# Patient Record
Sex: Female | Born: 1950
Health system: Southern US, Community
[De-identification: ages and names within clinical notes are randomized; demographics above are authoritative.]

## PROBLEM LIST (undated history)

## (undated) ENCOUNTER — Emergency Department (HOSPITAL_COMMUNITY): Admission: EM | Payer: 59 | Source: Home / Self Care

## (undated) DIAGNOSIS — N189 Chronic kidney disease, unspecified: Secondary | ICD-10-CM

## (undated) DIAGNOSIS — E119 Type 2 diabetes mellitus without complications: Secondary | ICD-10-CM

## (undated) DIAGNOSIS — M545 Low back pain, unspecified: Secondary | ICD-10-CM

## (undated) DIAGNOSIS — M1990 Unspecified osteoarthritis, unspecified site: Secondary | ICD-10-CM

## (undated) DIAGNOSIS — K219 Gastro-esophageal reflux disease without esophagitis: Secondary | ICD-10-CM

## (undated) DIAGNOSIS — G4700 Insomnia, unspecified: Secondary | ICD-10-CM

## (undated) DIAGNOSIS — E785 Hyperlipidemia, unspecified: Secondary | ICD-10-CM

## (undated) DIAGNOSIS — D649 Anemia, unspecified: Secondary | ICD-10-CM

## (undated) DIAGNOSIS — I2699 Other pulmonary embolism without acute cor pulmonale: Secondary | ICD-10-CM

## (undated) DIAGNOSIS — E669 Obesity, unspecified: Secondary | ICD-10-CM

## (undated) DIAGNOSIS — I10 Essential (primary) hypertension: Secondary | ICD-10-CM

## (undated) HISTORY — PX: TENOTOMY: SHX397

## (undated) HISTORY — DX: Hyperlipidemia, unspecified: E78.5

## (undated) HISTORY — DX: Obesity, unspecified: E66.9

## (undated) HISTORY — DX: Type 2 diabetes mellitus without complications: E11.9

## (undated) HISTORY — DX: Anemia, unspecified: D64.9

## (undated) HISTORY — DX: Unspecified osteoarthritis, unspecified site: M19.90

## (undated) HISTORY — DX: Chronic kidney disease, unspecified: N18.9

## (undated) HISTORY — PX: TUBAL LIGATION: SHX77

## (undated) HISTORY — DX: Low back pain, unspecified: M54.50

## (undated) HISTORY — PX: OTHER SURGICAL HISTORY: SHX169

## (undated) HISTORY — DX: Gastro-esophageal reflux disease without esophagitis: K21.9

## (undated) HISTORY — DX: Insomnia, unspecified: G47.00

## (undated) HISTORY — DX: Essential (primary) hypertension: I10

## (undated) HISTORY — DX: Low back pain: M54.5

---

## 1993-05-10 DIAGNOSIS — I10 Essential (primary) hypertension: Secondary | ICD-10-CM

## 1993-05-10 HISTORY — DX: Essential (primary) hypertension: I10

## 1998-05-10 DIAGNOSIS — E785 Hyperlipidemia, unspecified: Secondary | ICD-10-CM

## 1998-05-10 HISTORY — DX: Hyperlipidemia, unspecified: E78.5

## 1998-08-08 ENCOUNTER — Ambulatory Visit (HOSPITAL_COMMUNITY): Admission: RE | Admit: 1998-08-08 | Discharge: 1998-08-08 | Payer: Self-pay | Admitting: Pulmonary Disease

## 2000-10-25 ENCOUNTER — Encounter: Payer: Self-pay | Admitting: Internal Medicine

## 2000-10-25 ENCOUNTER — Ambulatory Visit (HOSPITAL_COMMUNITY): Admission: RE | Admit: 2000-10-25 | Discharge: 2000-10-25 | Payer: Self-pay | Admitting: Internal Medicine

## 2000-11-04 ENCOUNTER — Ambulatory Visit (HOSPITAL_COMMUNITY): Admission: RE | Admit: 2000-11-04 | Discharge: 2000-11-04 | Payer: Self-pay | Admitting: Internal Medicine

## 2000-11-04 ENCOUNTER — Encounter: Payer: Self-pay | Admitting: Internal Medicine

## 2000-11-08 ENCOUNTER — Ambulatory Visit (HOSPITAL_COMMUNITY): Admission: RE | Admit: 2000-11-08 | Discharge: 2000-11-08 | Payer: Self-pay | Admitting: Internal Medicine

## 2000-11-08 ENCOUNTER — Encounter: Payer: Self-pay | Admitting: Internal Medicine

## 2000-11-21 ENCOUNTER — Ambulatory Visit (HOSPITAL_COMMUNITY): Admission: RE | Admit: 2000-11-21 | Discharge: 2000-11-21 | Payer: Self-pay | Admitting: Internal Medicine

## 2000-11-21 ENCOUNTER — Encounter: Payer: Self-pay | Admitting: Internal Medicine

## 2000-11-24 ENCOUNTER — Other Ambulatory Visit: Admission: RE | Admit: 2000-11-24 | Discharge: 2000-11-24 | Payer: Self-pay | Admitting: General Surgery

## 2000-11-26 ENCOUNTER — Emergency Department (HOSPITAL_COMMUNITY): Admission: EM | Admit: 2000-11-26 | Discharge: 2000-11-26 | Payer: Self-pay | Admitting: Emergency Medicine

## 2000-11-26 ENCOUNTER — Encounter: Payer: Self-pay | Admitting: Emergency Medicine

## 2000-11-28 ENCOUNTER — Inpatient Hospital Stay (HOSPITAL_COMMUNITY): Admission: RE | Admit: 2000-11-28 | Discharge: 2000-12-01 | Payer: Self-pay | Admitting: General Surgery

## 2000-11-28 ENCOUNTER — Ambulatory Visit (HOSPITAL_COMMUNITY): Admission: RE | Admit: 2000-11-28 | Discharge: 2000-11-28 | Payer: Self-pay | Admitting: Emergency Medicine

## 2000-11-28 ENCOUNTER — Encounter: Payer: Self-pay | Admitting: Emergency Medicine

## 2001-04-19 ENCOUNTER — Encounter: Payer: Self-pay | Admitting: Internal Medicine

## 2001-04-19 ENCOUNTER — Ambulatory Visit (HOSPITAL_COMMUNITY): Admission: RE | Admit: 2001-04-19 | Discharge: 2001-04-19 | Payer: Self-pay | Admitting: Internal Medicine

## 2002-09-26 ENCOUNTER — Ambulatory Visit (HOSPITAL_COMMUNITY): Admission: RE | Admit: 2002-09-26 | Discharge: 2002-09-26 | Payer: Self-pay | Admitting: Internal Medicine

## 2002-09-26 ENCOUNTER — Encounter: Payer: Self-pay | Admitting: Internal Medicine

## 2003-10-01 ENCOUNTER — Ambulatory Visit (HOSPITAL_COMMUNITY): Admission: RE | Admit: 2003-10-01 | Discharge: 2003-10-01 | Payer: Self-pay | Admitting: Internal Medicine

## 2004-11-09 ENCOUNTER — Ambulatory Visit (HOSPITAL_COMMUNITY): Admission: RE | Admit: 2004-11-09 | Discharge: 2004-11-09 | Payer: Self-pay | Admitting: Internal Medicine

## 2005-02-12 ENCOUNTER — Ambulatory Visit (HOSPITAL_COMMUNITY): Admission: RE | Admit: 2005-02-12 | Discharge: 2005-02-12 | Payer: Self-pay | Admitting: Internal Medicine

## 2005-05-10 HISTORY — PX: CHOLECYSTECTOMY: SHX55

## 2007-04-26 ENCOUNTER — Ambulatory Visit: Payer: Self-pay | Admitting: Internal Medicine

## 2007-04-26 DIAGNOSIS — K219 Gastro-esophageal reflux disease without esophagitis: Secondary | ICD-10-CM | POA: Insufficient documentation

## 2007-04-26 DIAGNOSIS — I1A0 Resistant hypertension: Secondary | ICD-10-CM | POA: Insufficient documentation

## 2007-04-26 DIAGNOSIS — M545 Low back pain, unspecified: Secondary | ICD-10-CM | POA: Insufficient documentation

## 2007-04-26 DIAGNOSIS — I10 Essential (primary) hypertension: Secondary | ICD-10-CM | POA: Insufficient documentation

## 2007-04-27 ENCOUNTER — Telehealth (INDEPENDENT_AMBULATORY_CARE_PROVIDER_SITE_OTHER): Payer: Self-pay | Admitting: *Deleted

## 2007-05-25 ENCOUNTER — Telehealth (INDEPENDENT_AMBULATORY_CARE_PROVIDER_SITE_OTHER): Payer: Self-pay | Admitting: *Deleted

## 2007-05-25 ENCOUNTER — Ambulatory Visit: Payer: Self-pay | Admitting: Family Medicine

## 2007-05-25 DIAGNOSIS — R05 Cough: Secondary | ICD-10-CM

## 2007-05-25 DIAGNOSIS — K029 Dental caries, unspecified: Secondary | ICD-10-CM | POA: Insufficient documentation

## 2007-05-25 DIAGNOSIS — R059 Cough, unspecified: Secondary | ICD-10-CM | POA: Insufficient documentation

## 2007-06-09 ENCOUNTER — Ambulatory Visit: Payer: Self-pay | Admitting: Internal Medicine

## 2007-06-09 DIAGNOSIS — E1165 Type 2 diabetes mellitus with hyperglycemia: Secondary | ICD-10-CM

## 2007-06-09 DIAGNOSIS — E119 Type 2 diabetes mellitus without complications: Secondary | ICD-10-CM | POA: Insufficient documentation

## 2007-06-09 DIAGNOSIS — E118 Type 2 diabetes mellitus with unspecified complications: Secondary | ICD-10-CM | POA: Insufficient documentation

## 2007-06-09 DIAGNOSIS — Z794 Long term (current) use of insulin: Secondary | ICD-10-CM

## 2007-06-09 LAB — CONVERTED CEMR LAB
Blood Glucose, Fingerstick: 170
Hgb A1c MFr Bld: 8.8 %

## 2007-07-12 ENCOUNTER — Encounter (INDEPENDENT_AMBULATORY_CARE_PROVIDER_SITE_OTHER): Payer: Self-pay | Admitting: Family Medicine

## 2007-09-01 ENCOUNTER — Telehealth (INDEPENDENT_AMBULATORY_CARE_PROVIDER_SITE_OTHER): Payer: Self-pay | Admitting: Internal Medicine

## 2007-09-01 ENCOUNTER — Ambulatory Visit: Payer: Self-pay | Admitting: Internal Medicine

## 2007-09-01 LAB — CONVERTED CEMR LAB
Blood Glucose, Fingerstick: 141
Hgb A1c MFr Bld: 7.5 %

## 2007-09-03 LAB — CONVERTED CEMR LAB
BUN: 14 mg/dL (ref 6–23)
Calcium: 8.8 mg/dL (ref 8.4–10.5)
LDL Cholesterol: 118 mg/dL — ABNORMAL HIGH (ref 0–99)
Potassium: 4.3 meq/L (ref 3.5–5.3)
Sodium: 141 meq/L (ref 135–145)
Total CHOL/HDL Ratio: 3.8
Total Protein: 7.2 g/dL (ref 6.0–8.3)
Triglycerides: 89 mg/dL (ref <150)

## 2007-09-04 ENCOUNTER — Telehealth (INDEPENDENT_AMBULATORY_CARE_PROVIDER_SITE_OTHER): Payer: Self-pay | Admitting: *Deleted

## 2007-09-11 ENCOUNTER — Encounter (INDEPENDENT_AMBULATORY_CARE_PROVIDER_SITE_OTHER): Payer: Self-pay | Admitting: Internal Medicine

## 2007-10-06 ENCOUNTER — Telehealth (INDEPENDENT_AMBULATORY_CARE_PROVIDER_SITE_OTHER): Payer: Self-pay | Admitting: Internal Medicine

## 2007-10-09 ENCOUNTER — Telehealth (INDEPENDENT_AMBULATORY_CARE_PROVIDER_SITE_OTHER): Payer: Self-pay | Admitting: Internal Medicine

## 2007-11-24 ENCOUNTER — Ambulatory Visit: Payer: Self-pay | Admitting: Internal Medicine

## 2007-11-24 DIAGNOSIS — E785 Hyperlipidemia, unspecified: Secondary | ICD-10-CM | POA: Insufficient documentation

## 2007-11-24 DIAGNOSIS — R609 Edema, unspecified: Secondary | ICD-10-CM | POA: Insufficient documentation

## 2007-11-24 LAB — CONVERTED CEMR LAB: Blood Glucose, Fingerstick: 177

## 2007-11-27 LAB — CONVERTED CEMR LAB
Creatinine, Urine: 130.8 mg/dL
Microalb, Ur: 0.86 mg/dL (ref 0.00–1.89)

## 2008-02-23 ENCOUNTER — Ambulatory Visit (HOSPITAL_COMMUNITY): Admission: RE | Admit: 2008-02-23 | Discharge: 2008-02-23 | Payer: Self-pay | Admitting: Internal Medicine

## 2008-03-01 ENCOUNTER — Ambulatory Visit: Payer: Self-pay | Admitting: Internal Medicine

## 2008-03-01 DIAGNOSIS — M79606 Pain in leg, unspecified: Secondary | ICD-10-CM | POA: Insufficient documentation

## 2008-03-01 DIAGNOSIS — M79609 Pain in unspecified limb: Secondary | ICD-10-CM | POA: Insufficient documentation

## 2008-03-04 LAB — CONVERTED CEMR LAB
AST: 15 units/L (ref 0–37)
Albumin: 4.1 g/dL (ref 3.5–5.2)
Alkaline Phosphatase: 78 units/L (ref 39–117)
Basophils Relative: 0 % (ref 0–1)
CO2: 24 meq/L (ref 19–32)
Calcium: 8.9 mg/dL (ref 8.4–10.5)
Chloride: 101 meq/L (ref 96–112)
Creatinine, Ser: 0.8 mg/dL (ref 0.40–1.20)
HDL: 53 mg/dL (ref >39)
Hemoglobin: 11.5 g/dL — ABNORMAL LOW (ref 12.0–15.0)
Lymphocytes Relative: 50 % — ABNORMAL HIGH (ref 12–46)
Lymphs Abs: 2.5 10*3/uL (ref 0.7–4.0)
MCV: 85.8 fL (ref 78.0–100.0)
Monocytes Relative: 8 % (ref 3–12)
Neutro Abs: 2 10*3/uL (ref 1.7–7.7)
Platelets: 237 10*3/uL (ref 150–400)
Sodium: 139 meq/L (ref 135–145)
Total Bilirubin: 0.4 mg/dL (ref 0.3–1.2)
Total CHOL/HDL Ratio: 3.4
Total CK: 131 units/L (ref 7–177)
WBC: 5.1 10*3/uL (ref 4.0–10.5)

## 2008-03-07 ENCOUNTER — Encounter (INDEPENDENT_AMBULATORY_CARE_PROVIDER_SITE_OTHER): Payer: Self-pay | Admitting: Internal Medicine

## 2008-03-15 ENCOUNTER — Ambulatory Visit: Payer: Self-pay | Admitting: Internal Medicine

## 2008-03-15 ENCOUNTER — Ambulatory Visit (HOSPITAL_COMMUNITY): Admission: RE | Admit: 2008-03-15 | Discharge: 2008-03-15 | Payer: Self-pay | Admitting: Internal Medicine

## 2008-03-28 ENCOUNTER — Emergency Department (HOSPITAL_COMMUNITY): Admission: EM | Admit: 2008-03-28 | Discharge: 2008-03-28 | Payer: Self-pay | Admitting: Emergency Medicine

## 2008-04-10 ENCOUNTER — Ambulatory Visit: Payer: Self-pay | Admitting: Internal Medicine

## 2008-05-27 ENCOUNTER — Ambulatory Visit: Payer: Self-pay | Admitting: Internal Medicine

## 2008-05-27 LAB — CONVERTED CEMR LAB: Blood Glucose, Fingerstick: 177

## 2008-07-11 ENCOUNTER — Encounter (INDEPENDENT_AMBULATORY_CARE_PROVIDER_SITE_OTHER): Payer: Self-pay | Admitting: Internal Medicine

## 2008-07-12 ENCOUNTER — Ambulatory Visit: Payer: Self-pay | Admitting: Internal Medicine

## 2008-08-21 ENCOUNTER — Encounter (INDEPENDENT_AMBULATORY_CARE_PROVIDER_SITE_OTHER): Payer: Self-pay | Admitting: Internal Medicine

## 2008-08-23 ENCOUNTER — Ambulatory Visit: Payer: Self-pay | Admitting: Internal Medicine

## 2008-08-23 LAB — CONVERTED CEMR LAB: Hgb A1c MFr Bld: 7.9 %

## 2008-08-26 LAB — CONVERTED CEMR LAB
AST: 11 units/L (ref 0–37)
Albumin: 3.9 g/dL (ref 3.5–5.2)
Alkaline Phosphatase: 71 units/L (ref 39–117)
BUN: 19 mg/dL (ref 6–23)
Basophils Absolute: 0 10*3/uL (ref 0.0–0.1)
Calcium: 9.2 mg/dL (ref 8.4–10.5)
Chloride: 103 meq/L (ref 96–112)
Creatinine, Ser: 0.86 mg/dL (ref 0.40–1.20)
HDL: 50 mg/dL (ref >39)
Hemoglobin: 11 g/dL — ABNORMAL LOW (ref 12.0–15.0)
LDL Cholesterol: 136 mg/dL — ABNORMAL HIGH (ref 0–99)
MCHC: 31.9 g/dL (ref 30.0–36.0)
Monocytes Absolute: 0.5 10*3/uL (ref 0.1–1.0)
RBC: 4.24 M/uL (ref 3.87–5.11)
Sodium: 139 meq/L (ref 135–145)
Total Bilirubin: 0.3 mg/dL (ref 0.3–1.2)
Total Protein: 7 g/dL (ref 6.0–8.3)

## 2008-08-29 ENCOUNTER — Encounter (INDEPENDENT_AMBULATORY_CARE_PROVIDER_SITE_OTHER): Payer: Self-pay | Admitting: Internal Medicine

## 2008-09-02 LAB — CONVERTED CEMR LAB
TIBC: 355 ug/dL (ref 250–470)
UIBC: 290 ug/dL

## 2008-11-29 ENCOUNTER — Ambulatory Visit: Payer: Self-pay | Admitting: Internal Medicine

## 2008-11-29 LAB — CONVERTED CEMR LAB
Creatinine, Urine: 124.8 mg/dL
Microalb Creat Ratio: 4 mg/g (ref 0.0–30.0)

## 2008-12-27 ENCOUNTER — Ambulatory Visit: Payer: Self-pay | Admitting: Internal Medicine

## 2008-12-28 ENCOUNTER — Encounter (INDEPENDENT_AMBULATORY_CARE_PROVIDER_SITE_OTHER): Payer: Self-pay | Admitting: Internal Medicine

## 2008-12-28 LAB — CONVERTED CEMR LAB
Creatinine, Urine: 75.9 mg/dL
Microalb Creat Ratio: 6.6 mg/g (ref 0.0–30.0)
Microalb, Ur: 0.5 mg/dL (ref 0.00–1.89)

## 2008-12-30 ENCOUNTER — Telehealth (INDEPENDENT_AMBULATORY_CARE_PROVIDER_SITE_OTHER): Payer: Self-pay | Admitting: *Deleted

## 2009-01-03 ENCOUNTER — Encounter (INDEPENDENT_AMBULATORY_CARE_PROVIDER_SITE_OTHER): Payer: Self-pay | Admitting: Internal Medicine

## 2009-01-10 ENCOUNTER — Ambulatory Visit: Payer: Self-pay | Admitting: Internal Medicine

## 2009-02-03 ENCOUNTER — Ambulatory Visit: Payer: Self-pay | Admitting: Family Medicine

## 2009-02-03 DIAGNOSIS — E669 Obesity, unspecified: Secondary | ICD-10-CM | POA: Insufficient documentation

## 2009-02-25 ENCOUNTER — Ambulatory Visit (HOSPITAL_COMMUNITY): Admission: RE | Admit: 2009-02-25 | Discharge: 2009-02-25 | Payer: Self-pay | Admitting: Family Medicine

## 2009-03-17 ENCOUNTER — Encounter: Payer: Self-pay | Admitting: Family Medicine

## 2009-03-26 ENCOUNTER — Ambulatory Visit: Payer: Self-pay | Admitting: Family Medicine

## 2009-03-26 ENCOUNTER — Other Ambulatory Visit: Admission: RE | Admit: 2009-03-26 | Discharge: 2009-03-26 | Payer: Self-pay | Admitting: Family Medicine

## 2009-06-05 ENCOUNTER — Ambulatory Visit: Payer: Self-pay | Admitting: Family Medicine

## 2009-08-12 ENCOUNTER — Ambulatory Visit: Payer: Self-pay | Admitting: Family Medicine

## 2009-08-12 DIAGNOSIS — D649 Anemia, unspecified: Secondary | ICD-10-CM | POA: Insufficient documentation

## 2009-08-12 DIAGNOSIS — R5381 Other malaise: Secondary | ICD-10-CM | POA: Insufficient documentation

## 2009-08-12 DIAGNOSIS — R5383 Other fatigue: Secondary | ICD-10-CM

## 2009-08-13 ENCOUNTER — Encounter: Payer: Self-pay | Admitting: Family Medicine

## 2009-08-13 LAB — CONVERTED CEMR LAB: Retic Ct Pct: 0.8 % (ref 0.4–3.1)

## 2009-10-14 ENCOUNTER — Ambulatory Visit: Payer: Self-pay | Admitting: Family Medicine

## 2009-10-14 DIAGNOSIS — R1310 Dysphagia, unspecified: Secondary | ICD-10-CM | POA: Insufficient documentation

## 2009-10-19 DIAGNOSIS — G4700 Insomnia, unspecified: Secondary | ICD-10-CM | POA: Insufficient documentation

## 2009-11-24 ENCOUNTER — Ambulatory Visit: Payer: Self-pay | Admitting: Gastroenterology

## 2009-11-24 LAB — CONVERTED CEMR LAB: Retic Ct Pct: 1 % (ref 0.4–3.1)

## 2009-11-26 ENCOUNTER — Ambulatory Visit: Payer: Self-pay | Admitting: Family Medicine

## 2009-11-26 DIAGNOSIS — M7660 Achilles tendinitis, unspecified leg: Secondary | ICD-10-CM | POA: Insufficient documentation

## 2009-11-27 ENCOUNTER — Ambulatory Visit (HOSPITAL_COMMUNITY): Admission: RE | Admit: 2009-11-27 | Discharge: 2009-11-27 | Payer: Self-pay | Admitting: Gastroenterology

## 2009-11-27 ENCOUNTER — Ambulatory Visit: Payer: Self-pay | Admitting: Gastroenterology

## 2009-12-03 ENCOUNTER — Encounter: Payer: Self-pay | Admitting: Gastroenterology

## 2009-12-17 ENCOUNTER — Ambulatory Visit: Payer: Self-pay | Admitting: Orthopedic Surgery

## 2009-12-17 ENCOUNTER — Encounter: Payer: Self-pay | Admitting: Family Medicine

## 2009-12-18 ENCOUNTER — Encounter: Payer: Self-pay | Admitting: Orthopedic Surgery

## 2010-01-29 ENCOUNTER — Ambulatory Visit: Payer: Self-pay | Admitting: Orthopedic Surgery

## 2010-02-19 ENCOUNTER — Ambulatory Visit: Payer: Self-pay | Admitting: Orthopedic Surgery

## 2010-02-26 ENCOUNTER — Ambulatory Visit: Payer: Self-pay | Admitting: Family Medicine

## 2010-02-27 ENCOUNTER — Encounter: Payer: Self-pay | Admitting: Family Medicine

## 2010-03-02 LAB — CONVERTED CEMR LAB
BUN: 16 mg/dL (ref 6–23)
Calcium: 9.8 mg/dL (ref 8.4–10.5)
Chloride: 101 meq/L (ref 96–112)
Creatinine, Ser: 1.07 mg/dL (ref 0.40–1.20)
Hgb A1c MFr Bld: 7.1 % — ABNORMAL HIGH (ref <5.7)

## 2010-03-05 ENCOUNTER — Ambulatory Visit (HOSPITAL_COMMUNITY)
Admission: RE | Admit: 2010-03-05 | Discharge: 2010-03-05 | Payer: Self-pay | Source: Home / Self Care | Admitting: Family Medicine

## 2010-03-11 ENCOUNTER — Encounter (INDEPENDENT_AMBULATORY_CARE_PROVIDER_SITE_OTHER): Payer: Self-pay | Admitting: *Deleted

## 2010-04-08 ENCOUNTER — Ambulatory Visit (HOSPITAL_COMMUNITY)
Admission: RE | Admit: 2010-04-08 | Discharge: 2010-04-08 | Payer: Self-pay | Source: Home / Self Care | Admitting: Preventative Medicine

## 2010-04-22 ENCOUNTER — Ambulatory Visit: Payer: Self-pay | Admitting: Orthopedic Surgery

## 2010-04-22 DIAGNOSIS — IMO0002 Reserved for concepts with insufficient information to code with codable children: Secondary | ICD-10-CM | POA: Insufficient documentation

## 2010-04-23 ENCOUNTER — Ambulatory Visit: Payer: Self-pay | Admitting: Family Medicine

## 2010-04-24 ENCOUNTER — Telehealth: Payer: Self-pay | Admitting: Orthopedic Surgery

## 2010-04-28 ENCOUNTER — Ambulatory Visit (HOSPITAL_COMMUNITY)
Admission: RE | Admit: 2010-04-28 | Discharge: 2010-04-28 | Payer: Self-pay | Source: Home / Self Care | Attending: Orthopedic Surgery | Admitting: Orthopedic Surgery

## 2010-05-13 ENCOUNTER — Ambulatory Visit
Admission: RE | Admit: 2010-05-13 | Discharge: 2010-05-13 | Payer: Self-pay | Source: Home / Self Care | Attending: Orthopedic Surgery | Admitting: Orthopedic Surgery

## 2010-05-13 ENCOUNTER — Encounter: Payer: Self-pay | Admitting: Orthopedic Surgery

## 2010-05-13 DIAGNOSIS — M1710 Unilateral primary osteoarthritis, unspecified knee: Secondary | ICD-10-CM | POA: Insufficient documentation

## 2010-05-13 DIAGNOSIS — S83289A Other tear of lateral meniscus, current injury, unspecified knee, initial encounter: Secondary | ICD-10-CM | POA: Insufficient documentation

## 2010-05-13 DIAGNOSIS — IMO0002 Reserved for concepts with insufficient information to code with codable children: Secondary | ICD-10-CM | POA: Insufficient documentation

## 2010-05-14 ENCOUNTER — Encounter (INDEPENDENT_AMBULATORY_CARE_PROVIDER_SITE_OTHER): Payer: Self-pay | Admitting: *Deleted

## 2010-05-27 LAB — BASIC METABOLIC PANEL
BUN: 12 mg/dL (ref 6–23)
CO2: 29 mEq/L (ref 19–32)
Calcium: 9.4 mg/dL (ref 8.4–10.5)
Chloride: 98 mEq/L (ref 96–112)
Creatinine, Ser: 0.94 mg/dL (ref 0.4–1.2)
GFR calc Af Amer: >60 mL/min (ref >60)
GFR calc non Af Amer: >60 mL/min (ref >60)
Glucose, Bld: 113 mg/dL — ABNORMAL HIGH (ref 70–99)
Potassium: 3.7 mEq/L (ref 3.5–5.1)
Sodium: 137 mEq/L (ref 135–145)

## 2010-05-27 LAB — SURGICAL PCR SCREEN
MRSA, PCR: NEGATIVE
Staphylococcus aureus: NEGATIVE

## 2010-05-27 LAB — HEMOGLOBIN AND HEMATOCRIT, BLOOD
HCT: 34.4 % — ABNORMAL LOW (ref 36.0–46.0)
Hemoglobin: 11.5 g/dL — ABNORMAL LOW (ref 12.0–15.0)

## 2010-05-29 ENCOUNTER — Ambulatory Visit (HOSPITAL_COMMUNITY)
Admission: RE | Admit: 2010-05-29 | Discharge: 2010-05-29 | Payer: Self-pay | Source: Home / Self Care | Attending: Orthopedic Surgery | Admitting: Orthopedic Surgery

## 2010-05-29 HISTORY — PX: KNEE SURGERY: SHX244

## 2010-06-01 ENCOUNTER — Ambulatory Visit
Admission: RE | Admit: 2010-06-01 | Discharge: 2010-06-01 | Payer: Self-pay | Source: Home / Self Care | Attending: Orthopedic Surgery | Admitting: Orthopedic Surgery

## 2010-06-01 ENCOUNTER — Ambulatory Visit: Admit: 2010-06-01 | Payer: Self-pay | Admitting: Orthopedic Surgery

## 2010-06-01 DIAGNOSIS — Z9889 Other specified postprocedural states: Secondary | ICD-10-CM | POA: Insufficient documentation

## 2010-06-01 LAB — GLUCOSE, CAPILLARY
Glucose-Capillary: 100 mg/dL — ABNORMAL HIGH (ref 70–99)
Glucose-Capillary: 147 mg/dL — ABNORMAL HIGH (ref 70–99)

## 2010-06-03 ENCOUNTER — Encounter: Payer: Self-pay | Admitting: Orthopedic Surgery

## 2010-06-04 ENCOUNTER — Encounter (HOSPITAL_COMMUNITY)
Admission: RE | Admit: 2010-06-04 | Discharge: 2010-06-09 | Payer: Self-pay | Source: Home / Self Care | Attending: Orthopedic Surgery | Admitting: Orthopedic Surgery

## 2010-06-04 NOTE — Op Note (Addendum)
NAME:  Kathleen [name], Kathleen [name]               ACCOUNT NO.:  [id number]  MEDICAL RECORD NO.:  [id number]          PATIENT TYPE:  AMB  LOCATION:  DAY                           FACILITY:  [facility]  PHYSICIAN:  Carole Civil, M.D.DATE OF BIRTH:  1950-07-27  DATE OF PROCEDURE:  05/29/2010 DATE OF DISCHARGE:                              OPERATIVE REPORT   HISTORY:  This is a 60 year old female who presented with left knee pain, complained of inability to stand from a seated position 5/10 pain, swelling, popping.  She was treated nonoperatively, did not get better with anti-inflammatories and relative rest.  MRI was done April 28, 2010, showed a degenerative horizontal tear of the anterior horn of the lateral meniscus, 3 compartment arthritis and after discussing her options with her including further nonoperative treatment, she opted for a surgical treatment and was scheduled for arthroscopy of the left knee and partial lateral meniscectomy.  PREOPERATIVE DIAGNOSIS:  Torn lateral meniscus.  POSTOPERATIVE DIAGNOSES:  Torn lateral meniscus and osteoarthritis.  PROCEDURES:  Arthroscopy of the left knee, partial lateral meniscectomy, minimal debridement.  SURGEON:  Carole Civil, MD  ASSISTANTS:  None.  ANESTHESIA:  General.  OPERATIVE FINDINGS:  She did have a torn lateral meniscus, but it was a posterior horn tear with a displaced meniscal fragment in the notch just anterior to the anterior horn of the lateral meniscus.  She had a chondral lesion of the lateral femoral condyle and she had a chondral flap tear of the medial femoral condyle and a large amount of synovitis. Chondral surfaces on the medial side were degenerative grade 2 with flap tear and then on the lateral side, she had a grade 3 chondral lesion. No flap, patellofemoral compartment had mild changes, synovitis was noted medially and anteriorly.  No chondral changes were noted on the tibial surfaces.  Procedure was  done as follows.  The site marking was done of the left knee in the preop area.  The chart was updated.  The patient was taken to surgery, where she had general anesthesia.  Her left leg was prepped and draped with sterile technique, followed by execution of the time-out procedure.  Lateral and medial portal sites were injected with Marcaine and epinephrine dilute epinephrine solution, 10 mL were also placed in the joint.  Lateral incision was made and the scope was introduced through the lateral compartment across the notch into the medial compartment.  A tour of the knee was performed and then a medial portal was made with an 11-blade.  Probe was placed and the medial meniscus was examined and found to be stable with no tear.  There was a large chondral flap tear of the medial femoral condyle and large amount of synovitis which was debrided with a motorized shaver.  The ACL was intact.  PCL was normal.  Large meniscal fragment was found just anterior to the lateral meniscus and it was resected with a shaver and then the lateral meniscus was examined and found to be stable.  The fragment came from the posterior horn.  Debridement was done of the remaining portion of the lateral meniscus free  edge for some degenerative fraying.  Chondral defect was found and debrided down to a stable base laterally.  Then, a synovectomy was done on the medial side patellofemoral joint was found to have a mild chondral changes.  The knee was irrigated, closed with 3-0 nylon interrupted sutures.  We injected total of 60 mL of Marcaine in the joint.  Sterile bandages were applied.  The patient was extubated, had a brief hypoxic episode down to a sat of 80% which responded to oxygen back up to 94% when I left the OR and she was then brought to the recovery room in stable condition.  Postoperative plan is for full weightbearing.  Followup in the office on Monday, start physical therapy on  Tuesday.  She is discharged on Lorcet Plus and Phenergan.     Carole Civil, M.D.     SEH/MEDQ  D:  05/29/2010  T:  05/29/2010  Job:  [id number]  Electronically Signed by Arther Abbott M.D. on 06/04/2010 12:45:27 PM

## 2010-06-09 ENCOUNTER — Encounter: Payer: Self-pay | Admitting: Orthopedic Surgery

## 2010-06-09 NOTE — Assessment & Plan Note (Signed)
Summary: office visit   Vital Signs:  Patient profile:   60 year old female Menstrual status:  postmenopausal Height:      66.5 inches Weight:      237 pounds BMI:     37.82 O2 Sat:      97 % on Room air Pulse rate:   83 / minute Pulse rhythm:   regular Resp:     16 per minute BP sitting:   134 / 80  (left arm)  Vitals Entered By: Baldomero Lamy LPN (June  7, 624THL 624THL AM)  Nutrition Counseling: Patient's BMI is greater than 25 and therefore counseled on weight management options.  O2 Flow:  Room air CC: follow-up visit Is Patient Diabetic? Yes Did you bring your meter with you today? No Pain Assessment Patient in pain? no        Primary Care Provider:  Shanel Prazak  CC:  follow-up visit.  History of Present Illness: Reports  that she has been  doing well. Denies recent fever or chills. Denies sinus pressure, nasal congestion , ear pain or sore throat. Denies chest congestion, or cough productive of sputum. Denies chest pain, palpitations, PND, orthopnea or leg swelling. . Denies change in bowel movements or bloody stool. Denies dysuria , frequency, incontinence or hesitancy. Denies  joint pain, swelling, or reduced mobility. Denies headaches, vertigo, seizures. Denies depression, anxiety or insomnia. Denies  rash, lesions, or itch. Her blood sugars are tested regularly and she has her diary which shows improvement , though still more fluctuation than deemed healthy     Current Medications (verified): 1)  Glyburide-Metformin 5-500 Mg  Tabs (Glyburide-Metformin) .... 2 By Mouth Two Times A Day 2)  Low-Dose Aspirin 81 Mg  Tabs (Aspirin) .Marland Kitchen.. 1 By Mouth Once Daily 3)  Tylenol Arthritis Pain 650 Mg  Tbcr (Acetaminophen) .Marland Kitchen.. 1-2 By Mouth Three Times A Day As Needed Pain 4)  Gabapentin 300 Mg  Caps (Gabapentin) .Marland Kitchen.. 1 By Mouth Qam and 2 By Mouth At Bedtime 5)  Cetirizine Hcl 10 Mg Tabs (Cetirizine Hcl) .Marland Kitchen.. 1 By Mouth Once Daily 6)  Lantus Solostar 100 Unit/ml  Soln (Insulin Glargine) .... 32 Units Subcutaneously At Bedtime 7)  Omeprazole 20 Mg Cpdr (Omeprazole) .Marland Kitchen.. 1 By Mouth Once Daily 8)  [employer] Pen Needles 31g X 8 Mm Misc (Insulin Pen Needle) .... Use Daily With Lantus 9)  Lisinopril-Hydrochlorothiazide 20-12.5 Mg Tabs (Lisinopril-Hydrochlorothiazide) .... Two Tabs Once Daily 10)  Freestyle Lite Test  Strp (Glucose Blood) .... For Twice Daily Testing 11)  Freestyle Lancets  Misc (Lancets) .... Use As Directed Three Times A Day 12)  Januvia 100 Mg Tabs (Sitagliptin Phosphate) .... Take 1 Tablet By Mouth Once A Day 13)  Catapres 0.2 Mg Tabs (Clonidine Hcl) .... Take 1 Tab By Mouth At Bedtime 14)  Pravastatin Sodium 40 Mg Tabs (Pravastatin Sodium) .... Take 1 Tab By Mouth At Bedtime 15)  Carvedilol 12.5 Mg Tabs (Carvedilol) .... Take 1 Tablet By Mouth Two Times A Day 16)  Vitamin D (Ergocalciferol) 50000 Unit Caps (Ergocalciferol) .... One Cap By Mouth Every Week  Allergies (verified): No Known Drug Allergies  Review of Systems      See HPI General:  Complains of fatigue and sleep disorder; denies chills and fever; 3 to 4 hrs of sleep and works 7 days per week for 3weeks per month. Eyes:  Denies blurring, discharge, and double vision. GI:  Complains of abdominal pain; denies change in bowel habits, constipation, diarrhea, nausea, and vomiting;  epigastric pain and solid dysphagia x 2 yrs. Endo:  Denies cold intolerance, excessive hunger, excessive thirst, excessive urination, heat intolerance, polyuria, and weight change. Heme:  Denies abnormal bruising and bleeding. Allergy:  Denies hives or rash and itching eyes.  Physical Exam  General:  Well-developed,obuse,in no acute distress; alert,appropriate and cooperative throughout examination HEENT: No facial asymmetry,  EOMI, No sinus tenderness, TM's Clear, oropharynx  pink and moist.   Chest: Clear to auscultation bilaterally.  CVS: S1, S2, No murmurs, No S3.   Abd: Soft, Nontender.  MS:  Adequate ROM spine, hips, shoulders and knees.  Ext: No edema.   CNS: CN 2-12 intact, power tone and sensation normal throughout.   Skin: Intact, no visible lesions or rashes.  Psych: Good eye contact, normal affect.  Memory intact, not anxious or depressed appearing.    Impression & Recommendations:  Problem # 1:  DYSPHAGIA UNSPECIFIED [id number]) Assessment Deteriorated  Orders: Gastroenterology Referral (GI)  Problem # 2:  OBESITY (ICD-278.00) Assessment: Unchanged  Ht: 66.5 (10/14/2009)   Wt: 237 (10/14/2009)   BMI: 37.82 (10/14/2009)  Problem # 3:  HYPERLIPIDEMIA (ICD-272.4) Assessment: Comment Only  Her updated medication list for this problem includes:    Pravastatin Sodium 40 Mg Tabs (Pravastatin sodium) .Marland Kitchen... Take 1 tab by mouth at bedtime  Orders: T-Hepatic Function [phone]) T-Lipid Profile [id number])  Labs Reviewed: SGOT: 13 (08/12/2009)   SGPT: 21 (08/12/2009)  Prior 10 Yr Risk Heart Disease: Not enough information (05/25/2007)   HDL:44 (08/12/2009), 50 (08/23/2008)  LDL:113 (08/12/2009), 136 (08/23/2008)  Chol:182 (08/12/2009), 202 (08/23/2008)  Trig:124 (08/12/2009), 82 (08/23/2008)  Problem # 4:  HYPERTENSION (ICD-401.9) Assessment: Unchanged  Her updated medication list for this problem includes:    Lisinopril-hydrochlorothiazide 20-12.5 Mg Tabs (Lisinopril-hydrochlorothiazide) .Marland Kitchen..Marland Kitchen Two tabs once daily    Catapres 0.2 Mg Tabs (Clonidine hcl) .Marland Kitchen... Take 1 tab by mouth at bedtime    Carvedilol 12.5 Mg Tabs (Carvedilol) .Marland Kitchen... Take 1 tablet by mouth two times a day  Orders: T-Basic Metabolic Panel ([id number])  BP today: 134/80 Prior BP: 124/78 (08/12/2009)  Prior 10 Yr Risk Heart Disease: Not enough information (05/25/2007)  Labs Reviewed: K+: 4.4 (08/12/2009) Creat: : 1.21 (08/12/2009)   Chol: 182 (08/12/2009)   HDL: 44 (08/12/2009)   LDL: 113 (08/12/2009)   TG: 124 (08/12/2009)  Problem # 5:  DIABETES MELLITUS, TYPE II, UNCONTROLLED  (ICD-250.02) Assessment: Comment Only  Her updated medication list for this problem includes:    Glyburide-metformin 5-500 Mg Tabs (Glyburide-metformin) .Marland Kitchen... 2 by mouth two times a day    Low-dose Aspirin 81 Mg Tabs (Aspirin) .Marland Kitchen... 1 by mouth once daily    Lantus Solostar 100 Unit/ml Soln (Insulin glargine) .Marland Kitchen... 32 units subcutaneously at bedtime    Lisinopril-hydrochlorothiazide 20-12.5 Mg Tabs (Lisinopril-hydrochlorothiazide) .Marland Kitchen..Marland Kitchen Two tabs once daily    Januvia 100 Mg Tabs (Sitagliptin phosphate) .Marland Kitchen... Take 1 tablet by mouth once a day  Orders: T- Hemoglobin A1C [id number]), pt encouraged to be more consistent in her eating as far as carb intake is concerned, and to commit to regular physical activity  Problem # 6:  INSOMNIA (ICD-780.52) Assessment: Deteriorated  Her updated medication list for this problem includes:    Temazepam 15 Mg Caps (Temazepam) .Marland Kitchen... Take 1 capsule by mouth at bedtime  Discussed sleep hygiene.   Complete Medication List: 1)  Glyburide-metformin 5-500 Mg Tabs (Glyburide-metformin) .... 2 by mouth two times a day 2)  Low-dose Aspirin 81 Mg Tabs (Aspirin) .Marland Kitchen.. 1 by mouth once daily 3)  Tylenol Arthritis Pain 650 Mg Tbcr (Acetaminophen) .Marland Kitchen.. 1-2 by mouth three times a day as needed pain 4)  Gabapentin 300 Mg Caps (Gabapentin) .Marland Kitchen.. 1 by mouth qam and 2 by mouth at bedtime 5)  Cetirizine Hcl 10 Mg Tabs (Cetirizine hcl) .Marland Kitchen.. 1 by mouth once daily 6)  Lantus Solostar 100 Unit/ml Soln (Insulin glargine) .... 32 units subcutaneously at bedtime 7)  Omeprazole 20 Mg Cpdr (Omeprazole) .Marland Kitchen.. 1 by mouth once daily 8)  [employer] Pen Needles 31g X 8 Mm Misc (Insulin pen needle) .... Use daily with lantus 9)  Lisinopril-hydrochlorothiazide 20-12.5 Mg Tabs (Lisinopril-hydrochlorothiazide) .... Two tabs once daily 10)  Freestyle Lite Test Strp (Glucose blood) .... For twice daily testing 11)  Freestyle Lancets Misc (Lancets) .... Use as directed three times a day 12)  Januvia  100 Mg Tabs (Sitagliptin phosphate) .... Take 1 tablet by mouth once a day 13)  Catapres 0.2 Mg Tabs (Clonidine hcl) .... Take 1 tab by mouth at bedtime 14)  Pravastatin Sodium 40 Mg Tabs (Pravastatin sodium) .... Take 1 tab by mouth at bedtime 15)  Carvedilol 12.5 Mg Tabs (Carvedilol) .... Take 1 tablet by mouth two times a day 16)  Vitamin D (ergocalciferol) 50000 Unit Caps (Ergocalciferol) .... One cap by mouth every week 17)  Temazepam 15 Mg Caps (Temazepam) .... Take 1 capsule by mouth at bedtime  Other Orders: T-CBC w/Diff [phone]) T-TSH [phone]) T-Vitamin D (25-Hydroxy) [phone]) TLB-H. Pylori Abs(Helicobacter Pylori) ([id number]) T-Anemia Panel 3  (2904)  Patient Instructions: 1)  f/u mid July 2)  BMP prior to visit, ICD-9: 3)  Hepatic Panel prior to visit, ICD-9:  fasting , in mid july 4)  Lipid Panel prior to visit, ICD-9: 5)  TSH prior to visit, ICD-9: 6)  CBC w/ Diff prior to visit, ICD-9:, and anemia panel 7)  HbgA1C prior to visit, ICD-9: 8)  vit d level, h pylori 9)  new med for sleep sent in.pls practice good sleep hygiene. 10)  you will  be referred for colonscopy the office will contact you, also forupper endoscopy Prescriptions: TEMAZEPAM 15 MG CAPS (TEMAZEPAM) Take 1 capsule by mouth at bedtime  #30 x 2   Entered and Authorized by:   Tula Nakayama MD   Signed by:   Tula Nakayama MD on 10/14/2009   Method used:   Printed then faxed to ...       [facility])       [facility] [address]       [place], [place]  [address]       Ph: [phone]       Fax: [phone]   RxID:   [id number]

## 2010-06-09 NOTE — Letter (Signed)
Summary: Work [facility]  [address]  [place], [place] [address]   Phone: [phone]  Fax: [phone]      Today's Date: December 17, 2009  Name of Patient: Kathleen [name] [facility]  The above named patient had a medical visit in our office today.  Please note:  Special Instructions:  [ X ] To return to the normal work schedule today December 17, 2009.     Sincerely.   Demetrius Revel, MD

## 2010-06-09 NOTE — Letter (Signed)
Summary: pharmacy  pharmacy   Imported By: Dierdre Harness 03/04/2010 12:45:24  _____________________________________________________________________  External Attachment:    Type:   Image     Comment:   External Document

## 2010-06-09 NOTE — Assessment & Plan Note (Signed)
Summary: rt heel pain needs xr/cigna/simpson/bsf   Visit Type:  new patient Referring Chemere Steffler:  Moshe Cipro Primary Kathleen [name]:  Moshe Cipro  CC:  left foot pain.  History of Present Illness: I saw Kathleen [name] in the office today for an initial visit.  She is a 60 years old woman with the complaint of:  left foot pain  xrays today.  The patient planes of 2 months of pain in the posterior aspect of her LEFT heel which is worse when she walks.  She stands and walks at work and it seems to bother her more than.  Her pain is described as a stabbing sensation which is constant and she rates it a 6/10.  Pain is unrelieved by naproxen.  Nocatching or locking but she does have swelling.    Allergies: No Known Drug Allergies  Review of Systems Constitutional:  Denies weight loss, weight gain, fever, chills, and fatigue. Cardiovascular:  Denies chest pain, palpitations, fainting, and murmurs. Respiratory:  Complains of wheezing and couch; denies short of breath, tightness, pain on inspiration, and snoring . Gastrointestinal:  Denies heartburn, nausea, vomiting, diarrhea, constipation, and blood in your stools. Genitourinary:  Denies frequency, urgency, difficulty urinating, painful urination, flank pain, and bleeding in urine. Neurologic:  Complains of dizziness; denies numbness, tingling, unsteady gait, tremors, and seizure. Musculoskeletal:  Complains of joint pain and muscle pain; denies swelling, instability, stiffness, redness, and heat. Endocrine:  Denies excessive thirst, exessive urination, and heat or cold intolerance. Psychiatric:  Denies nervousness, depression, anxiety, and hallucinations. Skin:  Denies changes in the skin, poor healing, rash, itching, and redness. HEENT:  Denies blurred or double vision, eye pain, redness, and watering. Immunology:  Denies seasonal allergies, sinus problems, and allergic to bee stings. Hemoatologic:  Denies easy bleeding and brusing.  Physical  Exam  Skin:  intact without lesions or rashes Psych:  alert and cooperative; normal mood and affect; normal attention span and concentration   Foot/Ankle Exam  General:    Well-developed, well-nourished ,normal body habitus; no deformities, normal grooming.    Gait:    antalgic.    Palpation:    tenderness in the posterior Achilles area with a nodule in the watershed area  Vascular:    dorsalis pedis and posterior tibial pulses 2+ and symmetric, capillary refill < 2 seconds, normal hair pattern, no evidence of ischemia.   Sensory:    gross sensation intact bilaterally in lower extremities.    Motor:    Motor strength 5/5 bilaterally for ankle dorsiflexion, ankle plantar flexion, ankle inversion and ankle eversion.    Reflexes:    Normal and symmetric Achilles reflexes bilaterally.    Ankle Exam:    Left:    Inspection/Palpation:  range of motion is normal and the ankle and the ankle joint is stable   Impression & Recommendations:  Problem # 1:  ACHILLES BURSITIS OR TENDINITIS (ICD-726.71) Assessment New the ankle on the RIGHT/x-rays  Manner a dry is no there is no malalignment  Other Orders: New Patient Level III [id number]) Ankle x-ray complete,  minimum 3 views [id number])  Patient Instructions: 1)  Physical therapy  2)  Naproxen two times a day 3)  Wear brace except for work  4)  F/U in 4 weeks

## 2010-06-09 NOTE — Assessment & Plan Note (Signed)
Summary: 4 WK RE-CK RT HEEL/CIG[employer]/CAF   Visit Type:  Follow-up Referring Provider:  Moshe Cipro Primary Provider:  Moshe Cipro  CC:  recheck rt heel.  History of Present Illness: I saw Kathleen [name] in the office today for an initial visit.  She is a 60 years old woman with the complaint of:  left foot pain  The patient planes of 2 months of pain in the posterior aspect of her LEFT heel which is worse when she walks.  She stands and walks at work and it seems to bother her more than.  Her pain is described as a stabbing sensation which is constant and she rates it a 6/10.  Pain is unrelieved by naproxen.  No catching or locking but she does have swelling.  Today is 1 month recheck after therapy, Naproxen and cam walker.  Is better at home, but at work she has pain, she cant use boot at work.  She is not much better.  Naproxen helps.  NOT USING ICE   Allergies (verified): No Known Drug Allergies  Physical Exam  Additional Exam:  Palpation:    tenderness in the posterior Achilles area with a nodule in the watershed area  Vascular:    capillary refill < 2 seconds, normal hair pattern, no evidence of ischemia.   Sensory:    gross sensation intact    Motor:    Motor strength 5/5   for ankle dorsiflexion, ankle plantar flexion, ankle inversion and ankle eversion.    Ankle Exam:    Left:    Inspection/Palpation:  range of motion is normal  and the ankle joint is stable    Impression & Recommendations:  Problem # 1:  ACHILLES BURSITIS OR TENDINITIS (ICD-726.71) Assessment Improved  inject left ankle retro calc bursa   Verbal consent was obtained. The heel was prepped with alcohol and ethyl chloride. depomedrol 40mg /cc and sensorcaine .25% 1 cc each was injected. there were no coplications.  Orders: Est. Patient Level III [id number]) Joint Aspirate / Injection, Intermediate (20605) Depo- Medrol 40mg  (J1030)  Patient Instructions: 1)  You have received an injection of  cortisone today. You may experience increased pain at the injection site. Apply ice pack to the area for 20 minutes every 2 hours and take 2 xtra strength tylenol every 8 hours. This increased pain will usually resolve in 24 hours. The injection will take effect in 3-10 days.  2)  return 3 weeks

## 2010-06-09 NOTE — Assessment & Plan Note (Signed)
Summary: F UP   Vital Signs:  Patient profile:   60 year old female Menstrual status:  postmenopausal Height:      66.5 inches Weight:      234.50 pounds BMI:     37.42 O2 Sat:      96 % Pulse rate:   76 / minute Pulse rhythm:   regular Resp:     16 per minute BP sitting:   120 / 70  (left arm) Cuff size:   large  Vitals Entered By: Kate Sable LPN (July 20, 624THL [id number] PM)  Nutrition Counseling: Patient's BMI is greater than 25 and therefore counseled on weight management options. CC: Follow up chronic problems   Primary Care Provider:  Moshe Cipro  CC:  Follow up chronic problems.  History of Present Illness: Reports  that she has been  doing well. Denies recent fever or chills. Denies sinus pressure, nasal congestion , ear pain or sore throat. Denies chest congestion, or cough productive of sputum. Denies chest pain, palpitations, PND, orthopnea or leg swelling. Denies abdominal pain, nausea, vomitting, diarrhea or constipation. Denies change in bowel movements or bloody stool. Denies dysuria , frequency, incontinence or hesitancy.  Denies headaches, vertigo, seizures. Denies depression, anxiety or insomnia. Denies  rash, lesions, or itch.     Allergies: No Known Drug Allergies  Review of Systems      See HPI General:  Complains of fatigue. Eyes:  Denies blurring, discharge, eye pain, and red eye. MS:  left achillees tendon pain x 5 months, getting worse. Endo:  Denies cold intolerance, excessive hunger, excessive thirst, excessive urination, heat intolerance, polyuria, and weight change; tests once daily. Heme:  Denies abnormal bruising and bleeding. Allergy:  Complains of seasonal allergies; denies hives or rash and itching eyes.  Physical Exam  General:  Well-developed,obuse,in no acute distress; alert,appropriate and cooperative throughout examination HEENT: No facial asymmetry,  EOMI, No sinus tenderness, TM's Clear, oropharynx  pink and moist.   Chest:  Clear to auscultation bilaterally.  CVS: S1, S2, No murmurs, No S3.   Abd: Soft, Nontender.  MS: Adequate ROM spine, hips, shoulders and knees. tender achilees tendon(left) Ext: No edema.   CNS: CN 2-12 intact, power tone and sensation normal throughout.   Skin: Intact, no visible lesions or rashes.  Psych: Good eye contact, normal affect.  Memory intact, not anxious or depressed appearing.    Impression & Recommendations:  Problem # 1:  ACHILLES BURSITIS OR TENDINITIS (ICD-726.71) Assessment Comment Only  Orders: Orthopedic Referral (Ortho)  Problem # 2:  INSOMNIA (ICD-780.52) Assessment: Improved  Her updated medication list for this problem includes:    Temazepam 15 Mg Caps (Temazepam) .Marland Kitchen... Take 1 capsule by mouth at bedtime  Discussed sleep hygiene.   Problem # 3:  OBESITY (ICD-278.00) Assessment: Unchanged  Ht: 66.5 (11/26/2009)   Wt: 234.50 (11/26/2009)   BMI: 37.42 (11/26/2009)  Problem # 4:  HYPERLIPIDEMIA (ICD-272.4) Assessment: Comment Only  Her updated medication list for this problem includes:    Pravastatin Sodium 40 Mg Tabs (Pravastatin sodium) .Marland Kitchen... Take 1 tab by mouth at bedtime  Labs Reviewed: SGOT: 13 (08/12/2009)   SGPT: 21 (08/12/2009)  Prior 10 Yr Risk Heart Disease: Not enough information (05/25/2007)   HDL:44 (08/12/2009), 50 (08/23/2008)  LDL:113 (08/12/2009), 136 (08/23/2008)  Chol:182 (08/12/2009), 202 (08/23/2008)  Trig:124 (08/12/2009), 82 (08/23/2008)  Problem # 5:  HYPERTENSION (ICD-401.9) Assessment: Unchanged  Her updated medication list for this problem includes:    Lisinopril-hydrochlorothiazide 20-12.5 Mg Tabs (Lisinopril-hydrochlorothiazide) .Marland KitchenMarland KitchenMarland KitchenMarland Kitchen  Two tabs once daily    Catapres 0.2 Mg Tabs (Clonidine hcl) .Marland Kitchen... Take 1 tab by mouth at bedtime    Carvedilol 12.5 Mg Tabs (Carvedilol) .Marland Kitchen... Take 1 tablet by mouth two times a day  BP today: 120/70 Prior BP: 122/80 (11/24/2009)  Prior 10 Yr Risk Heart Disease: Not enough  information (05/25/2007)  Labs Reviewed: K+: 4.4 (08/12/2009) Creat: : 1.21 (08/12/2009)   Chol: 182 (08/12/2009)   HDL: 44 (08/12/2009)   LDL: 113 (08/12/2009)   TG: 124 (08/12/2009)  Problem # 6:  DIABETES MELLITUS, TYPE II, UNCONTROLLED (ICD-250.02) Assessment: Improved  The following medications were removed from the medication list:    Lantus Solostar 100 Unit/ml Soln (Insulin glargine) .Marland Kitchen... 32 units subcutaneously at bedtime Her updated medication list for this problem includes:    Glyburide-metformin 5-500 Mg Tabs (Glyburide-metformin) .Marland Kitchen... 2 by mouth two times a day    Low-dose Aspirin 81 Mg Tabs (Aspirin) .Marland Kitchen... 1 by mouth once daily    Lisinopril-hydrochlorothiazide 20-12.5 Mg Tabs (Lisinopril-hydrochlorothiazide) .Marland Kitchen..Marland Kitchen Two tabs once daily    Januvia 100 Mg Tabs (Sitagliptin phosphate) .Marland Kitchen... Take 1 tablet by mouth once a day    Lantus Solostar 100 Unit/ml Soln (Insulin glargine) .Marland Kitchen... 32 units subcutaneously at bedtime  Orders: T- Hemoglobin A1C [id number])  Labs Reviewed: Creat: 1.21 (08/12/2009)    Reviewed HgBA1c results: 7.7 (08/12/2009)  7.5 (03/26/2009)  Complete Medication List: 1)  Glyburide-metformin 5-500 Mg Tabs (Glyburide-metformin) .... 2 by mouth two times a day 2)  Low-dose Aspirin 81 Mg Tabs (Aspirin) .Marland Kitchen.. 1 by mouth once daily 3)  Tylenol Arthritis Pain 650 Mg Tbcr (Acetaminophen) .Marland Kitchen.. 1-2 by mouth three times a day as needed pain 4)  Gabapentin 300 Mg Caps (Gabapentin) .Marland Kitchen.. 1 by mouth qam and 2 by mouth at bedtime 5)  Cetirizine Hcl 10 Mg Tabs (Cetirizine hcl) .Marland Kitchen.. 1 by mouth once daily 6)  Omeprazole 20 Mg Cpdr (Omeprazole) .Marland Kitchen.. 1 by mouth once daily 7)  [employer] Pen Needles 31g X 8 Mm Misc (Insulin pen needle) .... Use daily with lantus 8)  Lisinopril-hydrochlorothiazide 20-12.5 Mg Tabs (Lisinopril-hydrochlorothiazide) .... Two tabs once daily 9)  Freestyle Lite Test Strp (Glucose blood) .... For twice daily testing 10)  Freestyle Lancets Misc  (Lancets) .... Use as directed three times a day 11)  Januvia 100 Mg Tabs (Sitagliptin phosphate) .... Take 1 tablet by mouth once a day 12)  Catapres 0.2 Mg Tabs (Clonidine hcl) .... Take 1 tab by mouth at bedtime 13)  Pravastatin Sodium 40 Mg Tabs (Pravastatin sodium) .... Take 1 tab by mouth at bedtime 14)  Carvedilol 12.5 Mg Tabs (Carvedilol) .... Take 1 tablet by mouth two times a day 15)  Vitamin D (ergocalciferol) 50000 Unit Caps (Ergocalciferol) .... One cap by mouth every week 16)  Temazepam 15 Mg Caps (Temazepam) .... Take 1 capsule by mouth at bedtime 17)  Lantus Solostar 100 Unit/ml Soln (Insulin glargine) .... 32 units subcutaneously at bedtime 18)  Vimovo 500-20 Mg Tbec (Naproxen-esomeprazole) .... Take 1 tablet by mouth two times a day for 2 weeks , then as needed  Patient Instructions: 1)  Please schedule a follow-up appointment in 3 months. 2)  It is important that you exercise regularly at least 60 minutes 7 times a week. If you develop chest pain, have severe difficulty breathing, or feel very tired , stop exercising immediately and seek medical attention. 3)  You need to lose weight. Consider a lower calorie diet and regular exercise.  4)  Check your blood sugars regularly. If your readings are usually above 250 or below 70 you should contact our office. 5)  CONGRATS on you much improvedblood sugars , keep it up! 6)  Your cholesterol, liver and kidneys are fine 7)  No med changes at this time, except you will have med sent in for achilees tendonitis, and I wil refer you to Dr Bonnita Levan also Prescriptions: VIMOVO 500-20 MG TBEC (NAPROXEN-ESOMEPRAZOLE) Take 1 tablet by mouth two times a day for 2 weeks , then as needed  #60 x 0   Entered and Authorized by:   Tula Nakayama MD   Signed by:   Tula Nakayama MD on 11/26/2009   Method used:   Electronically to        [facility] (retail)       [address] [address]       [place], [place]   [address]       Ph: [phone]       Fax: [phone]   RxID:   [id number] LANTUS SOLOSTAR 100 UNIT/ML SOLN (INSULIN GLARGINE) 32 units subcutaneously at bedtime  #1000 units x 5   Entered and Authorized by:   Tula Nakayama MD   Signed by:   Tula Nakayama MD on 11/26/2009   Method used:   Electronically to        [facility] (retail)       [place] [address]       [place], [place]  [address]       Ph: [phone]       Fax: [phone]   RxID:   [id number]

## 2010-06-09 NOTE — Assessment & Plan Note (Signed)
Summary: ANEMIA,DYSPHAGIA,GERD/SS   Visit Type:  Initial Consult Referring Provider:  Moshe Cipro Primary Care Provider:  Moshe Cipro  Chief Complaint:  anemia and dysphagia/gerd.  History of Present Illness: 60 y/o obese black female w/ GERD x several yrs.  c/o heartburn & indigestion couple times per week.  On omeprazole 20mg  daily helps, occ breakthrough.  Solid food stuck in mid-esophagus, intermittantly x 2 yrs.  No problems w/ liquids.  Weight loss 7-10# in past couple months.  Has been walking & trying to lose weight.  Denies constipation, occ diarrhea rarely.  Denies rectal bleeding or melena.  Has been dx w/ normocytic anemia.  Hgb 11, ferritin 68, low % sat 17, B12, folate normal, iron 60.    Current Problems (verified): 1)  Insomnia  (ICD-780.52) 2)  Dysphagia Unspecified  (ICD-787.20) 3)  Special Screening For Malignant Neoplasms Colon  (ICD-V76.51) 4)  Anemia  (ICD-285.9) 5)  Fatigue  (ICD-780.79) 6)  Obesity  (ICD-278.00) 7)  Encounter For Long-term Use of Other Medications  (ICD-V58.69) 8)  Leg Edema, Bilateral  (ICD-782.3) 9)  Leg Pain, Bilateral  (ICD-729.5) 10)  Hyperlipidemia  (ICD-272.4) 11)  Cough  (ICD-786.2) 12)  Dental Caries  (ICD-521.00) 13)  Low Back Pain  (ICD-724.2) 14)  Hypertension  (ICD-401.9) 15)  Gerd  (ICD-530.81) 16)  Diabetes Mellitus, Type II, Uncontrolled  (ICD-250.02)  Current Medications (verified): 1)  Glyburide-Metformin 5-500 Mg  Tabs (Glyburide-Metformin) .... 2 By Mouth Two Times A Day 2)  Low-Dose Aspirin 81 Mg  Tabs (Aspirin) .Marland Kitchen.. 1 By Mouth Once Daily 3)  Tylenol Arthritis Pain 650 Mg  Tbcr (Acetaminophen) .Marland Kitchen.. 1-2 By Mouth Three Times A Day As Needed Pain 4)  Gabapentin 300 Mg  Caps (Gabapentin) .Marland Kitchen.. 1 By Mouth Qam and 2 By Mouth At Bedtime 5)  Cetirizine Hcl 10 Mg Tabs (Cetirizine Hcl) .Marland Kitchen.. 1 By Mouth Once Daily 6)  Lantus Solostar 100 Unit/ml Soln (Insulin Glargine) .... 32 Units Subcutaneously At Bedtime 7)  Omeprazole 20 Mg Cpdr  (Omeprazole) .Marland Kitchen.. 1 By Mouth Once Daily 8)  [employer] Pen Needles 31g X 8 Mm Misc (Insulin Pen Needle) .... Use Daily With Lantus 9)  Lisinopril-Hydrochlorothiazide 20-12.5 Mg Tabs (Lisinopril-Hydrochlorothiazide) .... Two Tabs Once Daily 10)  Freestyle Lite Test  Strp (Glucose Blood) .... For Twice Daily Testing 11)  Freestyle Lancets  Misc (Lancets) .... Use As Directed Three Times A Day 12)  Januvia 100 Mg Tabs (Sitagliptin Phosphate) .... Take 1 Tablet By Mouth Once A Day 13)  Catapres 0.2 Mg Tabs (Clonidine Hcl) .... Take 1 Tab By Mouth At Bedtime 14)  Pravastatin Sodium 40 Mg Tabs (Pravastatin Sodium) .... Take 1 Tab By Mouth At Bedtime 15)  Carvedilol 12.5 Mg Tabs (Carvedilol) .... Take 1 Tablet By Mouth Two Times A Day 16)  Vitamin D (Ergocalciferol) 50000 Unit Caps (Ergocalciferol) .... One Cap By Mouth Every Week 17)  Temazepam 15 Mg Caps (Temazepam) .... Take 1 Capsule By Mouth At Bedtime  Allergies (verified): No Known Drug Allergies  Past History:  Past Medical History: Diabetes mellitus, type II  approxsince 2000 GERD Hypertension  approx since 1995  Low back pain Hyperlipidemia  approx 2000 Obesity INSOMNIA (ICD-780.52) DYSPHAGIA UNSPECIFIED (ICD-787.20) ANEMIA (ICD-285.9)  Family History: father-80-?DM mother-deceased 72-DM, HTN, MI sister-53-DM sister-57 brother-deceased-52-DM brother-deceased-37-murder son-41 son-38 son-35 son-34  Social History: Divorced lives with 2nd oldest son Never Smoked Alcohol use-no Drug use-no Occupation: works as full time Engineer, [employer]  Review of Systems General:  Complains of fatigue, weakness, malaise, and  sleep disorder; denies fever, chills, sweats, anorexia, and weight loss. CV:  Denies chest pains, angina, palpitations, syncope, dyspnea on exertion, orthopnea, PND, peripheral edema, and claudication. Resp:  Complains of wheezing; denies dyspnea at rest, dyspnea with exercise, cough, sputum, coughing up blood, and  pleurisy. GI:  Denies jaundice and fecal incontinence. GU:  Denies urinary burning, blood in urine, nocturnal urination, urinary frequency, urinary incontinence, and abnormal vaginal bleeding. MS:  Complains of low back pain, leg pain at night, and leg pain with exertion; denies joint pain / LOM, joint swelling, joint stiffness, joint deformity, muscle weakness, muscle cramps, muscle atrophy, and shoulder pain / LOM hand / wrist pain (CTS). Derm:  Denies rash, itching, dry skin, hives, moles, warts, and unhealing ulcers. Psych:  Denies depression, anxiety, memory loss, suicidal ideation, hallucinations, paranoia, phobia, and confusion. Heme:  Denies bruising, bleeding, and enlarged lymph nodes.  Vital Signs:  Patient profile:   60 year old female Menstrual status:  postmenopausal Height:      66.5 inches Weight:      234.50 pounds BMI:     37.42 Temp:     97.8 degrees F oral Pulse rate:   72 / minute BP sitting:   122 / 80  (left arm) Cuff size:   large  Vitals Entered By: Waldon Merl LPN (July 18, 624THL 075-GRM PM)  Physical Exam  General:  Obese, Well developed, no acute distress. Head:  Normocephalic and atraumatic. Eyes:  sclera clear, no icterus. Ears:  Normal auditory acuity. Nose:  No deformity, discharge,  or lesions. Mouth:  pharynx pink and moist, fair dentition, and teeth missing.   Neck:  Supple; no masses or thyromegaly. Lungs:  Clear throughout to auscultation. Heart:  Normal rate and regular rhythm. S1 and S2 normal without gallop, murmur, click, rub or other extra sounds. Abdomen:  normal bowel sounds, obese, without guarding, without rebound, no masses, and no hepatomegally or splenomegaly.   Rectal:  deferred until time of colonoscopy.   Msk:  Symmetrical with no gross deformities. Normal posture. Pulses:  Normal pulses noted. Extremities:  No clubbing, cyanosis, edema or deformities noted. Neurologic:  Alert and  oriented x4;  grossly normal  neurologically. Skin:  Intact without significant lesions or rashes. Cervical Nodes:  No significant cervical adenopathy. Psych:  Alert and cooperative. Normal mood and affect.  Impression & Recommendations:  Problem # 1:  DYSPHAGIA UNSPECIFIED (ICD-787.20) 60 y/o black female w/ GERD and dysphagia on PPI.  ? complicated GERD, esophagitis, esophageal web, ring, stricture, or malignancy.  Diagnostic EGD and possible esophageal dilatation along w/ screening colonoscopy to be performed by Dr. Barney Drain in the near future.  I have discussed risks and benefits which include, but are not limited to, bleeding, infection, perforation, or medication reaction.  The patient agrees with this plan and consent will be obtained.  Orders: Consultation Level III [id number])  Problem # 2:  GERD (ICD-530.81) See #1  Problem # 3:  SPECIAL SCREENING FOR MALIGNANT NEOPLASMS COLON (ICD-V76.51) Screening colonoscopy at the same time.  Patient Instructions: 1)  Take 1/2 dose of LANTUS insulin (16 UNITS) the night before the test instead of your usual dose 2)  Hold your bedtime dose of glyburide night before procedure and morning Januvia and glyburide until after the test 3)  Bring all your meds to hospital  4)  Follow up w/ Dr Moshe Cipro regarding anemia 5)  The medication list was reviewed and reconciled.  All changed / newly prescribed medications were explained.  A complete medication list was provided to the patient / caregiver.

## 2010-06-09 NOTE — Assessment & Plan Note (Signed)
Summary: office visit   Vital Signs:  Patient profile:   60 year old female Menstrual status:  postmenopausal Height:      66.5 inches Weight:      238.75 pounds BMI:     38.10 O2 Sat:      97 % Pulse rate:   75 / minute Pulse rhythm:   regular Resp:     16 per minute BP sitting:   124 / 78  (left arm) Cuff size:   xl  Vitals Entered By: Kate Sable LPN (April  5, 624THL [id number] AM)  Nutrition Counseling: Patient's BMI is greater than 25 and therefore counseled on weight management options. CC: Follow up chronic problems Is Patient Diabetic? Yes   Primary Care Provider:  Azarria Balint  CC:  Follow up chronic problems.  History of Present Illness: Reports  that tshe has been doing fairly well. She tests her blood sugar approximately 4 days per week, and her log reveals an avg about 250. She denies polyuria or polydypsia. Denies recent fever or chills. Denies sinus pressure, nasal congestion , ear pain or sore throat. Denies chest congestion, or cough productive of sputum. Denies chest pain, palpitations, PND, orthopnea or leg swelling. Denies abdominal pain, nausea, vomitting, diarrhea or constipation. Denies change in bowel movements or bloody stool. Denies dysuria , frequency, incontinence or hesitancy. Denies  joint pain, swelling, or reduced mobility. Denies headaches, vertigo, seizures. Denies depression, anxiety or insomnia. Denies  rash, lesions, or itch.     Current Medications (verified): 1)  Glyburide-Metformin 5-500 Mg  Tabs (Glyburide-Metformin) .... 2 By Mouth Two Times A Day 2)  Low-Dose Aspirin 81 Mg  Tabs (Aspirin) .Marland Kitchen.. 1 By Mouth Once Daily 3)  Tylenol Arthritis Pain 650 Mg  Tbcr (Acetaminophen) .Marland Kitchen.. 1-2 By Mouth Three Times A Day As Needed Pain 4)  Gabapentin 300 Mg  Caps (Gabapentin) .Marland Kitchen.. 1 By Mouth Qam and 2 By Mouth At Bedtime 5)  Cetirizine Hcl 10 Mg Tabs (Cetirizine Hcl) .Marland Kitchen.. 1 By Mouth Once Daily 6)  Lantus Solostar 100 Unit/ml Soln (Insulin  Glargine) .... 32 Units Subcutaneously At Bedtime 7)  Omeprazole 20 Mg Cpdr (Omeprazole) .Marland Kitchen.. 1 By Mouth Once Daily 8)  [employer] Pen Needles 31g X 8 Mm Misc (Insulin Pen Needle) .... Use Daily With Lantus 9)  Lisinopril-Hydrochlorothiazide 20-12.5 Mg Tabs (Lisinopril-Hydrochlorothiazide) .... Two Tabs Once Daily 10)  Freestyle Lite Test  Strp (Glucose Blood) .... For Twice Daily Testing 11)  Freestyle Lancets  Misc (Lancets) .... Use As Directed Three Times A Day 12)  Januvia 100 Mg Tabs (Sitagliptin Phosphate) .... Take 1 Tablet By Mouth Once A Day 13)  Catapres 0.2 Mg Tabs (Clonidine Hcl) .... Take 1 Tab By Mouth At Bedtime 14)  Pravastatin Sodium 40 Mg Tabs (Pravastatin Sodium) .... Take 1 Tab By Mouth At Bedtime 15)  Carvedilol 12.5 Mg Tabs (Carvedilol) .... Take 1 Tablet By Mouth Two Times A Day  Allergies (verified): No Known Drug Allergies  Review of Systems      See HPI Eyes:  Denies blurring and discharge. Endo:  See HPI; Denies cold intolerance, excessive hunger, excessive thirst, excessive urination, heat intolerance, polyuria, and weight change. Heme:  Denies abnormal bruising and bleeding. Allergy:  Complains of seasonal allergies.  Physical Exam  General:  Well-developed,obuse,in no acute distress; alert,appropriate and cooperative throughout examination HEENT: No facial asymmetry,  EOMI, No sinus tenderness, TM's Clear, oropharynx  pink and moist.   Chest: Clear to auscultation bilaterally.  CVS: S1, S2,  No murmurs, No S3.   Abd: Soft, Nontender.  MS: Adequate ROM spine, hips, shoulders and knees.  Ext: No edema.   CNS: CN 2-12 intact, power tone and sensation normal throughout.   Skin: Intact, no visible lesions or rashes.  Psych: Good eye contact, normal affect.  Memory intact, not anxious or depressed appearing.    Impression & Recommendations:  Problem # 1:  OBESITY (ICD-278.00) Assessment Unchanged  Ht: 66.5 (08/12/2009)   Wt: 238.75 (08/12/2009)   BMI:  38.10 (08/12/2009)  Problem # 2:  HYPERLIPIDEMIA (ICD-272.4)  Her updated medication list for this problem includes:    Pravastatin Sodium 40 Mg Tabs (Pravastatin sodium) .Marland Kitchen... Take 1 tab by mouth at bedtime  Orders: T-Hepatic Function [phone]) T-Lipid Profile [phone])  Labs Reviewed: SGOT: 11 (08/23/2008)   SGPT: 9 (08/23/2008)  Prior 10 Yr Risk Heart Disease: Not enough information (05/25/2007)   HDL:50 (08/23/2008), 53 (03/01/2008)  LDL:136 (08/23/2008), 110 (03/01/2008)  Chol:202 (08/23/2008), 182 (03/01/2008)  Trig:82 (08/23/2008), 96 (03/01/2008)  Problem # 3:  DIABETES MELLITUS, TYPE II, UNCONTROLLED (ICD-250.02) Assessment: Comment Only  Her updated medication list for this problem includes:    Glyburide-metformin 5-500 Mg Tabs (Glyburide-metformin) .Marland Kitchen... 2 by mouth two times a day    Low-dose Aspirin 81 Mg Tabs (Aspirin) .Marland Kitchen... 1 by mouth once daily    Lantus Solostar 100 Unit/ml Soln (Insulin glargine) .Marland Kitchen... 32 units subcutaneously at bedtime    Lisinopril-hydrochlorothiazide 20-12.5 Mg Tabs (Lisinopril-hydrochlorothiazide) .Marland Kitchen..Marland Kitchen Two tabs once daily    Januvia 100 Mg Tabs (Sitagliptin phosphate) .Marland Kitchen... Take 1 tablet by mouth once a day  Orders: T- Hemoglobin A1C [id number])  Labs Reviewed: Creat: 0.86 (08/23/2008)    Reviewed HgBA1c results: 7.5 (03/26/2009)  8.7 (11/29/2008)  Problem # 4:  HYPERTENSION (ICD-401.9) Assessment: Improved  Her updated medication list for this problem includes:    Lisinopril-hydrochlorothiazide 20-12.5 Mg Tabs (Lisinopril-hydrochlorothiazide) .Marland Kitchen..Marland Kitchen Two tabs once daily    Catapres 0.2 Mg Tabs (Clonidine hcl) .Marland Kitchen... Take 1 tab by mouth at bedtime    Carvedilol 12.5 Mg Tabs (Carvedilol) .Marland Kitchen... Take 1 tablet by mouth two times a day  Orders: T-Basic Metabolic Panel ([id number])  BP today: 124/78 Prior BP: 130/80 (06/05/2009)  Prior 10 Yr Risk Heart Disease: Not enough information (05/25/2007)  Labs Reviewed: K+: 4.3  (08/23/2008) Creat: : 0.86 (08/23/2008)   Chol: 202 (08/23/2008)   HDL: 50 (08/23/2008)   LDL: 136 (08/23/2008)   TG: 82 (08/23/2008)  Problem # 5:  GERD (ICD-530.81) Assessment: Improved  Her updated medication list for this problem includes:    Omeprazole 20 Mg Cpdr (Omeprazole) .Marland Kitchen... 1 by mouth once daily  Complete Medication List: 1)  Glyburide-metformin 5-500 Mg Tabs (Glyburide-metformin) .... 2 by mouth two times a day 2)  Low-dose Aspirin 81 Mg Tabs (Aspirin) .Marland Kitchen.. 1 by mouth once daily 3)  Tylenol Arthritis Pain 650 Mg Tbcr (Acetaminophen) .Marland Kitchen.. 1-2 by mouth three times a day as needed pain 4)  Gabapentin 300 Mg Caps (Gabapentin) .Marland Kitchen.. 1 by mouth qam and 2 by mouth at bedtime 5)  Cetirizine Hcl 10 Mg Tabs (Cetirizine hcl) .Marland Kitchen.. 1 by mouth once daily 6)  Lantus Solostar 100 Unit/ml Soln (Insulin glargine) .... 32 units subcutaneously at bedtime 7)  Omeprazole 20 Mg Cpdr (Omeprazole) .Marland Kitchen.. 1 by mouth once daily 8)  [employer] Pen Needles 31g X 8 Mm Misc (Insulin pen needle) .... Use daily with lantus 9)  Lisinopril-hydrochlorothiazide 20-12.5 Mg Tabs (Lisinopril-hydrochlorothiazide) .... Two tabs once daily 10)  Freestyle Lite Test  Strp (Glucose blood) .... For twice daily testing 11)  Freestyle Lancets Misc (Lancets) .... Use as directed three times a day 12)  Januvia 100 Mg Tabs (Sitagliptin phosphate) .... Take 1 tablet by mouth once a day 13)  Catapres 0.2 Mg Tabs (Clonidine hcl) .... Take 1 tab by mouth at bedtime 14)  Pravastatin Sodium 40 Mg Tabs (Pravastatin sodium) .... Take 1 tab by mouth at bedtime 15)  Carvedilol 12.5 Mg Tabs (Carvedilol) .... Take 1 tablet by mouth two times a day  Other Orders: T-CBC w/Diff [id number]) T- * Misc. Laboratory test [phone])  Patient Instructions: 1)  Please schedule a follow-up appointment in 2 months. 2)  It is important that you exercise regularly at least 20 minutes 5 times a week. If you develop chest pain, have severe difficulty  breathing, or feel very tired , stop exercising immediately and seek medical attention. 3)  You need to lose weight. Consider a lower calorie diet and regular exercise.  4)  Check your blood sugars regularly. If your readings are usually above250: or below 70 you should contact our office. 5)  Labs today, we will call this week with any med changes

## 2010-06-09 NOTE — Letter (Signed)
Summary: tcs/egd order  tcs/egd order   Imported By: Burnadette Peter LPN 624THL 624THL  _____________________________________________________________________  External Attachment:    Type:   Image     Comment:   External Document

## 2010-06-09 NOTE — Letter (Signed)
Summary: Recall Office Visit  [facility]  [address]   [place], [place] [address]   Phone: [phone]  Fax: [phone]      November  2, [id number]   [facility] [id number] [address] [place], [place]  [address] 1951/01/12   Dear Ms. Carne[place],   According to our records, it is time for you to schedule a follow-up office visit with [place].   At your convenience, please call [phone] to schedule an office visit. If you have any questions, concerns, or feel that this letter is in error, we would appreciate your call.   Sincerely,    [facility] Ph: [phone]   Fax: [phone]

## 2010-06-09 NOTE — Assessment & Plan Note (Signed)
Summary: 3 WK RE-CK RT HEEL/CIG[employer]/CAF   Visit Type:  Follow-up Referring Kathleen [name]:  Moshe Cipro Primary Kathleen [name]:  Moshe Cipro  CC:  recheck left heel.  History of Present Illness: I saw Kathleen [name] in the office today for al visit.  She is a 60 years old woman with the complaint of:  left foot pain  The patient planes of 2 months of pain in the posterior aspect of her LEFT heel which is worse when she walks.  She stands and walks at work and it seems to bother her more than.  Her pain is described as a stabbing sensation which is constant and she rates it a 6/10.  Pain is unrelieved by naproxen.  No catching or locking but she does have swelling.  Today is 3 week recheck after injection for achilles teninitis, treatments include physical therapy, Cam Walker and bursal injection.  Injection seems to have helped the most. Still has some soreness. Unfortunately, she cannot work in the [employer], so she wears around the house.       Allergies: No Known Drug Allergies  Physical Exam  Additional Exam:  she still tender RIGHT over the Achilles tendon. Her range of motion is good. Her ankle is stable. The tenderness is at the watershed area. No palpable defect.   Impression & Recommendations:  Problem # 1:  ACHILLES TENDINITIS (ICD-726.71) Assessment Improved  Her recovery is going to be , slow because of the frequent weightbearing and inability to fully wear the Cam Walker, so I asked her to use some Aspercreme to soak the foot in Epsom salt come back in 6 weeks. Brace when not at work  Orders: Est. Patient Level II [phone])  Patient Instructions: 1)  Brace when not at work [dont sleep it it] 2)  soak the foot in epsom salt for 20 min  3)  6 weeks re check  4)  Aspercreme two times a day

## 2010-06-09 NOTE — Letter (Signed)
Summary: MEDICAL RECORD RELEASE  MEDICAL RECORD RELEASE   Imported By: Dierdre Harness 01/02/2010 11:22:46  _____________________________________________________________________  External Attachment:    Type:   Image     Comment:   External Document

## 2010-06-09 NOTE — Letter (Signed)
Summary: Historic Patient File  Historic Patient File   Imported By: Ruffin Pyo 12/18/2009 09:32:32  _____________________________________________________________________  External Attachment:    Type:   Image     Comment:   External Document

## 2010-06-09 NOTE — Assessment & Plan Note (Signed)
Summary: F UP   Vital Signs:  Patient profile:   60 year old female Menstrual status:  postmenopausal Height:      66.5 inches Weight:      245 pounds BMI:     39.09 O2 Sat:      94 % Pulse rate:   75 / minute Pulse rhythm:   regular Resp:     16 per minute BP sitting:   130 / 80 Cuff size:   xl  Vitals Entered By: Kate Sable (June 05, 2009 10:17 AM)  Nutrition Counseling: Patient's BMI is greater than 25 and therefore counseled on weight management options. CC: Follow up chronic problems Is Patient Diabetic? Yes   Primary Care Provider:  Pierrette Scheu  CC:  Follow up chronic problems.  History of Present Illness: Reports  that she has been doing fairly well. She has modified her diet, improved her exercise, and has sucxeeded in some improvement in her blood sugars and weight loss of 5 ppounds. Her blood pressure is also improved.. Denies recent fever or chills. Denies sinus pressure, nasal congestion , ear pain or sore throat. Denies chest congestion, or cough productive of sputum. Denies chest pain, palpitations, PND, orthopnea or leg swelling. Denies abdominal pain, nausea, vomitting, diarrhea or constipation. Denies change in bowel movements or bloody stool. Denies dysuria , frequency, incontinence or hesitancy. Denies  joint pain, swelling, or reduced mobility. Denies headaches, vertigo, seizures. Denies depression, anxiety or insomnia. Denies  rash, lesions, or itch.     Current Medications (verified): 1)  Amlodipine Besylate 10 Mg  Tabs (Amlodipine Besylate) .Marland Kitchen.. 1 By Mouth Once Daily 2)  Actos 45 Mg  Tabs (Pioglitazone Hcl) .Marland Kitchen.. 1 By Mouth Once Daily 3)  Glyburide-Metformin 5-500 Mg  Tabs (Glyburide-Metformin) .... 2 By Mouth Two Times A Day 4)  Low-Dose Aspirin 81 Mg  Tabs (Aspirin) .Marland Kitchen.. 1 By Mouth Once Daily 5)  Carvedilol 12.5 Mg Tabs (Carvedilol) .Marland Kitchen.. 1 By Mouth Two Times A Day 6)  Tylenol Arthritis Pain 650 Mg  Tbcr (Acetaminophen) .Marland Kitchen.. 1-2 By  Mouth Three Times A Day As Needed Pain 7)  Gabapentin 300 Mg  Caps (Gabapentin) .Marland Kitchen.. 1 By Mouth Qam and 2 By Mouth At Bedtime 8)  Pravastatin Sodium 80 Mg Tabs (Pravastatin Sodium) .... Take One Tab Once Daily 9)  Cetirizine Hcl 10 Mg Tabs (Cetirizine Hcl) .Marland Kitchen.. 1 By Mouth Once Daily 10)  Lantus Solostar 100 Unit/ml Soln (Insulin Glargine) .... 32 Units Subcutaneously At Bedtime 11)  Omeprazole 20 Mg Cpdr (Omeprazole) .Marland Kitchen.. 1 By Mouth Once Daily 12)  [employer] Pen Needles 31g X 8 Mm Misc (Insulin Pen Needle) .... Use Daily With Lantus 13)  Lisinopril-Hydrochlorothiazide 20-12.5 Mg Tabs (Lisinopril-Hydrochlorothiazide) .... Two Tabs Once Daily 14)  Freestyle Lite Test  Strp (Glucose Blood) .... Use As Directed Three Times A Day 15)  Freestyle Lancets  Misc (Lancets) .... Use As Directed Three Times A Day 16)  Januvia 100 Mg Tabs (Sitagliptin Phosphate) .... Take 1 Tablet By Mouth Once A Day 17)  Catapres 0.2 Mg Tabs (Clonidine Hcl) .... Take 1 Tab By Mouth At Bedtime  Allergies (verified): No Known Drug Allergies  Review of Systems      See HPI Eyes:  Denies blurring and discharge. Neuro:  Denies headaches. Endo:  Denies cold intolerance, excessive hunger, excessive thirst, excessive urination, heat intolerance, polyuria, and weight change; blood sugar log shows improvement , with fasdtings around 130 to 140. Heme:  Denies abnormal bruising and bleeding. Allergy:  Denies hives or rash and itching eyes.  Physical Exam  General:  Well-developed,obuse,in no acute distress; alert,appropriate and cooperative throughout examination HEENT: No facial asymmetry,  EOMI, No sinus tenderness, TM's Clear, oropharynx  pink and moist.   Chest: Clear to auscultation bilaterally.  CVS: S1, S2, No murmurs, No S3.   Abd: Soft, Nontender.  MS: Adequate ROM spine, hips, shoulders and knees.  Ext: No edema.   CNS: CN 2-12 intact, power tone and sensation normal throughout.   Skin: Intact, no visible lesions  or rashes.  Psych: Good eye contact, normal affect.  Memory intact, not anxious or depressed appearing.    Impression & Recommendations:  Problem # 1:  OBESITY (ICD-278.00) Assessment Improved  Ht: 66.5 (06/05/2009)   Wt: 245 (06/05/2009)   BMI: 39.09 (06/05/2009)  Problem # 2:  HYPERLIPIDEMIA (ICD-272.4) Assessment: Comment Only  The following medications were removed from the medication list:    Pravastatin Sodium 80 Mg Tabs (Pravastatin sodium) .Marland Kitchen... Take one tab once daily    Pravachol 40 Mg Tabs (Pravastatin sodium) .Marland Kitchen... Take 1 tab by mouth at bedtime Her updated medication list for this problem includes:    Pravastatin Sodium 40 Mg Tabs (Pravastatin sodium) .Marland Kitchen... Take 1 tab by mouth at bedtime needs lipid profile Labs Reviewed: SGOT: 11 (08/23/2008)   SGPT: 9 (08/23/2008)  Prior 10 Yr Risk Heart Disease: Not enough information (05/25/2007)   HDL:50 (08/23/2008), 53 (03/01/2008)  LDL:136 (08/23/2008), 110 (03/01/2008)  Chol:202 (08/23/2008), 182 (03/01/2008)  Trig:82 (08/23/2008), 96 (03/01/2008)  Problem # 3:  HYPERTENSION (ICD-401.9) Assessment: Improved  The following medications were removed from the medication list:    Amlodipine Besylate 10 Mg Tabs (Amlodipine besylate) .Marland Kitchen... 1 by mouth once daily    Carvedilol 12.5 Mg Tabs (Carvedilol) .Marland Kitchen... 1 by mouth two times a day Her updated medication list for this problem includes:    Lisinopril-hydrochlorothiazide 20-12.5 Mg Tabs (Lisinopril-hydrochlorothiazide) .Marland Kitchen..Marland Kitchen Two tabs once daily    Catapres 0.2 Mg Tabs (Clonidine hcl) .Marland Kitchen... Take 1 tab by mouth at bedtime  BP today: 130/80 Prior BP: 140/90 (03/26/2009)  Prior 10 Yr Risk Heart Disease: Not enough information (05/25/2007)  Labs Reviewed: K+: 4.3 (08/23/2008) Creat: : 0.86 (08/23/2008)   Chol: 202 (08/23/2008)   HDL: 50 (08/23/2008)   LDL: 136 (08/23/2008)   TG: 82 (08/23/2008)  Problem # 4:  DIABETES MELLITUS, TYPE II, UNCONTROLLED (ICD-250.02) Assessment:  Unchanged  The following medications were removed from the medication list:    Actos 45 Mg Tabs (Pioglitazone hcl) .Marland Kitchen... 1 by mouth once daily Her updated medication list for this problem includes:    Glyburide-metformin 5-500 Mg Tabs (Glyburide-metformin) .Marland Kitchen... 2 by mouth two times a day    Low-dose Aspirin 81 Mg Tabs (Aspirin) .Marland Kitchen... 1 by mouth once daily    Lantus Solostar 100 Unit/ml Soln (Insulin glargine) .Marland Kitchen... 32 units subcutaneously at bedtime    Lisinopril-hydrochlorothiazide 20-12.5 Mg Tabs (Lisinopril-hydrochlorothiazide) .Marland Kitchen..Marland Kitchen Two tabs once daily    Januvia 100 Mg Tabs (Sitagliptin phosphate) .Marland Kitchen... Take 1 tablet by mouth once a day  Orders: Glucose, (CBG) [phone])  Labs Reviewed: Creat: 0.86 (08/23/2008)    Reviewed HgBA1c results: 7.5 (03/26/2009)  8.7 (11/29/2008)  Complete Medication List: 1)  Glyburide-metformin 5-500 Mg Tabs (Glyburide-metformin) .... 2 by mouth two times a day 2)  Low-dose Aspirin 81 Mg Tabs (Aspirin) .Marland Kitchen.. 1 by mouth once daily 3)  Tylenol Arthritis Pain 650 Mg Tbcr (Acetaminophen) .Marland Kitchen.. 1-2 by mouth three times a day as needed pain 4)  Gabapentin 300 Mg Caps (Gabapentin) .Marland Kitchen.. 1 by mouth qam and 2 by mouth at bedtime 5)  Cetirizine Hcl 10 Mg Tabs (Cetirizine hcl) .Marland Kitchen.. 1 by mouth once daily 6)  Lantus Solostar 100 Unit/ml Soln (Insulin glargine) .... 32 units subcutaneously at bedtime 7)  Omeprazole 20 Mg Cpdr (Omeprazole) .Marland Kitchen.. 1 by mouth once daily 8)  [employer] Pen Needles 31g X 8 Mm Misc (Insulin pen needle) .... Use daily with lantus 9)  Lisinopril-hydrochlorothiazide 20-12.5 Mg Tabs (Lisinopril-hydrochlorothiazide) .... Two tabs once daily 10)  Freestyle Lite Test Strp (Glucose blood) .... For twice daily testing 11)  Freestyle Lancets Misc (Lancets) .... Use as directed three times a day 12)  Januvia 100 Mg Tabs (Sitagliptin phosphate) .... Take 1 tablet by mouth once a day 13)  Catapres 0.2 Mg Tabs (Clonidine hcl) .... Take 1 tab by mouth at  bedtime 14)  Pravastatin Sodium 40 Mg Tabs (Pravastatin sodium) .... Take 1 tab by mouth at bedtime  Patient Instructions: 1)  Please schedule a follow-up appointment in 2 months. 2)  It is important that you exercise regularly at least 20 minutes 5 times a week. If you develop chest pain, have severe difficulty breathing, or feel very tired , stop exercising immediately and seek medical attention. 3)  You need to lose weight. Consider a lower calorie diet and regular exercise.  4)  blood pressure is great,no med changes. 5)  cut back on sweets and breads, rice and pasta yooursugaer ias still too high Prescriptions: FREESTYLE LITE TEST  STRP (GLUCOSE BLOOD) For twice daily testing  #100 x 4   Entered by:   Kate Sable   Authorized by:   Tula Nakayama MD   Signed by:   Kate Sable on 06/05/2009   Method used:   Electronically to        [facility] (retail)       [place] [address] [place], [place]  [address]       Ph: [phone]       Fax: [phone]   [id number] PRAVASTATIN SODIUM 40 MG TABS (PRAVASTATIN SODIUM) Take 1 tab by mouth at bedtime  #30 x 3   Entered and Authorized by:   Tula Nakayama MD   Signed by:   Tula Nakayama MD on 06/05/2009   Method used:   Printed then faxed to ...       [facility] (retail)       [place] [address]       [place], [place]  [address]       Ph: [phone]       Fax: [phone]   RxID:   [id number] PRAVACHOL 40 MG TABS (PRAVASTATIN SODIUM) Take 1 tab by mouth at bedtime  #30 x 3   Entered and Authorized by:   Tula Nakayama MD   Signed by:   Tula Nakayama MD on 06/05/2009   Method used:   Printed then faxed to ...       [facility] (retail)       [place] [address]       [place], [place]  [address]       Ph: [phone]       Fax: [phone]   RxID:   [id number]   Laboratory Results   Blood  Tests  Glucose (fasting): 93 mg/dL   (Normal Range: 70-105)       Appended Document: F UP since this charg is a late entry I have billed a level 5 on the assumption that it will automatically drop to a 4 , let me know if this is incorrect pls

## 2010-06-09 NOTE — Assessment & Plan Note (Signed)
Summary: office visit   Vital Signs:  Patient profile:   60 year old female Menstrual status:  postmenopausal Height:      66.5 inches Weight:      237.25 pounds BMI:     37.86 O2 Sat:      96 % on Room air Pulse rate:   68 / minute Pulse rhythm:   regular Resp:     16 per minute BP sitting:   144 / 80  (left arm)  Vitals Entered By: Baldomero Lamy LPN (October 20, 624THL 10:51 AM)  Nutrition Counseling: Patient's BMI is greater than 25 and therefore counseled on weight management options.  O2 Flow:  Room air CC: follow-up visit Is Patient Diabetic? Yes Did you bring your meter with you today? No Pain Assessment Patient in pain? no        Primary Care Provider:  Moshe Cipro  CC:  follow-up visit.  History of Present Illness: Reports  that she has been doing well, and has no newconcerns  Denies recent fever or chills. Denies sinus pressure, nasal congestion , ear pain or sore throat. Denies chest congestion, or cough productive of sputum. Denies chest pain, palpitations, PND, orthopnea or leg swelling. Denies abdominal pain, nausea, vomitting, diarrhea or constipation. Denies change in bowel movements or bloody stool. Denies dysuria , frequency, incontinence or hesitancy. Denies  joint pain, swelling, or reduced mobility. Denies headaches, vertigo, seizures. Denies depression, anxiety or insomnia. Denies  rash, lesions, or itch.     Current Medications (verified): 1)  Glyburide-Metformin 5-500 Mg  Tabs (Glyburide-Metformin) .... 2 By Mouth Two Times A Day 2)  Low-Dose Aspirin 81 Mg  Tabs (Aspirin) .Marland Kitchen.. 1 By Mouth Once Daily 3)  Tylenol Arthritis Pain 650 Mg  Tbcr (Acetaminophen) .Marland Kitchen.. 1-2 By Mouth Three Times A Day As Needed Pain 4)  Gabapentin 300 Mg  Caps (Gabapentin) .Marland Kitchen.. 1 By Mouth Qam and 2 By Mouth At Bedtime 5)  Cetirizine Hcl 10 Mg Tabs (Cetirizine Hcl) .Marland Kitchen.. 1 By Mouth Once Daily 6)  Omeprazole 20 Mg Cpdr (Omeprazole) .Marland Kitchen.. 1 By Mouth Once Daily 7)  [employer] Pen  Needles 31g X 8 Mm Misc (Insulin Pen Needle) .... Use Daily With Lantus 8)  Lisinopril-Hydrochlorothiazide 20-12.5 Mg Tabs (Lisinopril-Hydrochlorothiazide) .... Two Tabs Once Daily 9)  Freestyle Lite Test  Strp (Glucose Blood) .... For Twice Daily Testing 10)  Freestyle Lancets  Misc (Lancets) .... Use As Directed Three Times A Day 11)  Januvia 100 Mg Tabs (Sitagliptin Phosphate) .... Take 1 Tablet By Mouth Once A Day 12)  Catapres 0.2 Mg Tabs (Clonidine Hcl) .... Take 1 Tab By Mouth At Bedtime 13)  Pravastatin Sodium 40 Mg Tabs (Pravastatin Sodium) .... Take 1 Tab By Mouth At Bedtime 14)  Carvedilol 12.5 Mg Tabs (Carvedilol) .... Take 1 Tablet By Mouth Two Times A Day 15)  Temazepam 15 Mg Caps (Temazepam) .... Take 1 Capsule By Mouth At Bedtime 16)  Lantus Solostar 100 Unit/ml Soln (Insulin Glargine) .... 32 Units Subcutaneously At Bedtime 17)  Vimovo 500-20 Mg Tbec (Naproxen-Esomeprazole) .... Take 1 Tablet By Mouth Two Times A Day For 2 Weeks , Then As Needed  Allergies (verified): No Known Drug Allergies  Review of Systems      See HPI Eyes:  Denies discharge and red eye. Endo:  Denies excessive thirst and excessive urination. Heme:  Denies abnormal bruising and bleeding. Allergy:  Denies hives or rash and itching eyes.  Physical Exam  General:  Well-developed,obuse,in no acute distress;  alert,appropriate and cooperative throughout examination HEENT: No facial asymmetry,  EOMI, No sinus tenderness, TM's Clear, oropharynx  pink and moist.   Chest: Clear to auscultation bilaterally.  CVS: S1, S2, No murmurs, No S3.   Abd: Soft, Nontender.  MS: Adequate ROM spine, hips, shoulders and knees. tender achilees tendon(left) Ext: No edema.   CNS: CN 2-12 intact, power tone and sensation normal throughout.   Skin: Intact, no visible lesions or rashes.  Psych: Good eye contact, normal affect.  Memory intact, not anxious or depressed appearing.   Diabetes Management Exam:    Foot Exam  (with socks and/or shoes not present):       Sensory-Monofilament:          Left foot: diminished          Right foot: diminished       Inspection:          Left foot: normal          Right foot: normal       Nails:          Left foot: thickened          Right foot: thickened   Impression & Recommendations:  Problem # 1:  INSOMNIA (ICD-780.52) Assessment Improved  Her updated medication list for this problem includes:    Temazepam 15 Mg Caps (Temazepam) .Marland Kitchen... Take 1 capsule by mouth at bedtime  Discussed sleep hygiene.   Problem # 2:  OBESITY (ICD-278.00) Assessment: Deteriorated  Ht: 66.5 (02/26/2010)   Wt: 237.25 (02/26/2010)   BMI: 37.86 (02/26/2010) therapeutic lifestyle change discussed and encouraged  Problem # 3:  DIABETES MELLITUS, TYPE II, UNCONTROLLED (ICD-250.02) Assessment: Improved  Her updated medication list for this problem includes:    Glyburide-metformin 5-500 Mg Tabs (Glyburide-metformin) .Marland Kitchen... 2 by mouth two times a day    Low-dose Aspirin 81 Mg Tabs (Aspirin) .Marland Kitchen... 1 by mouth once daily    Lisinopril-hydrochlorothiazide 20-12.5 Mg Tabs (Lisinopril-hydrochlorothiazide) .Marland Kitchen..Marland Kitchen Two tabs once daily    Januvia 100 Mg Tabs (Sitagliptin phosphate) .Marland Kitchen... Take 1 tablet by mouth once a day    Lantus Solostar 100 Unit/ml Soln (Insulin glargine) .Marland Kitchen... 32 units subcutaneously at bedtime  Orders: T- Hemoglobin A1C [id number]) T- Hemoglobin A1C [id number]) T-Urine Microalbumin w/creat. ratio [phone])  Labs Reviewed: Creat: 1.05 (11/24/2009)    Reviewed HgBA1c results: 7.0 (11/24/2009)  7.7 (08/12/2009)  Problem # 4:  HYPERTENSION (ICD-401.9) Assessment: Deteriorated  The following medications were removed from the medication list:    Carvedilol 12.5 Mg Tabs (Carvedilol) .Marland Kitchen... Take 1 tablet by mouth two times a day Her updated medication list for this problem includes:    Lisinopril-hydrochlorothiazide 20-12.5 Mg Tabs  (Lisinopril-hydrochlorothiazide) .Marland Kitchen..Marland Kitchen Two tabs once daily    Catapres 0.2 Mg Tabs (Clonidine hcl) .Marland Kitchen... Take 1 tab by mouth at bedtime    Amlodipine Besylate 5 Mg Tabs (Amlodipine besylate) .Marland Kitchen... Take 1 tablet by mouth once a day  Orders: T-Basic Metabolic Panel (99991111) T-Basic Metabolic Panel (99991111)  BP today: 144/80 Prior BP: 120/70 (11/26/2009)  Prior 10 Yr Risk Heart Disease: Not enough information (05/25/2007)  Labs Reviewed: K+: 4.3 (11/24/2009) Creat: : 1.05 (11/24/2009)   Chol: 165 (11/24/2009)   HDL: 49 (11/24/2009)   LDL: 96 (11/24/2009)   TG: 100 (11/24/2009)  Complete Medication List: 1)  Glyburide-metformin 5-500 Mg Tabs (Glyburide-metformin) .... 2 by mouth two times a day 2)  Low-dose Aspirin 81 Mg Tabs (Aspirin) .Marland Kitchen.. 1 by mouth once daily 3)  Tylenol Arthritis Pain 650  Mg Tbcr (Acetaminophen) .Marland Kitchen.. 1-2 by mouth three times a day as needed pain 4)  Gabapentin 300 Mg Caps (Gabapentin) .Marland Kitchen.. 1 by mouth qam and 2 by mouth at bedtime 5)  Cetirizine Hcl 10 Mg Tabs (Cetirizine hcl) .Marland Kitchen.. 1 by mouth once daily 6)  Omeprazole 20 Mg Cpdr (Omeprazole) .Marland Kitchen.. 1 by mouth once daily 7)  [employer] Pen Needles 31g X 8 Mm Misc (Insulin pen needle) .... Use daily with lantus 8)  Lisinopril-hydrochlorothiazide 20-12.5 Mg Tabs (Lisinopril-hydrochlorothiazide) .... Two tabs once daily 9)  Freestyle Lite Test Strp (Glucose blood) .... For twice daily testing 10)  Freestyle Lancets Misc (Lancets) .... Use as directed three times a day 11)  Januvia 100 Mg Tabs (Sitagliptin phosphate) .... Take 1 tablet by mouth once a day 12)  Catapres 0.2 Mg Tabs (Clonidine hcl) .... Take 1 tab by mouth at bedtime 13)  Pravastatin Sodium 40 Mg Tabs (Pravastatin sodium) .... Take 1 tab by mouth at bedtime 14)  Temazepam 15 Mg Caps (Temazepam) .... Take 1 capsule by mouth at bedtime 15)  Lantus Solostar 100 Unit/ml Soln (Insulin glargine) .... 32 units subcutaneously at bedtime 16)  Vimovo 500-20 Mg  Tbec (Naproxen-esomeprazole) .... Take 1 tablet by mouth two times a day for 2 weeks , then as needed 17)  Amlodipine Besylate 5 Mg Tabs (Amlodipine besylate) .... Take 1 tablet by mouth once a day  Other Orders: T-Lipid Profile [id number]) T-Hepatic Function [id number]) Influenza Vaccine MCR [id number])  Patient Instructions: 1)  Cpe in 6 TO 8weeks 2)  BMP prior to visit, ICD-9: 3)  HbgA1C prior to visit, ICD-9: 4)  todAY. 5)  MICROALB TODAY. 6)  Stop CARVEDILOL TODAY., START AMLODIPINE ONE DAILY TODAY. 7)  PlS TAKE Bp MEDS AT THE SAME TiME EVERY DAY 8)  It is important that you exercise regularly at least 20 minutes 5 times a week. If you develop chest pain, have severe difficulty breathing, or feel very tired , stop exercising immediately and seek medical attention. 9)  You need to lose weight. Consider a lower calorie diet and regular exercise.  10)  BMP prior to visit, ICD-9: 11)  Hepatic Panel prior to visit, ICD-9: 12)  Lipid Panel prior to visit, ICD-9:   FASTING IN 12 WEEKS 13)  HbgA1C prior to visit, ICD-9: Prescriptions: AMLODIPINE BESYLATE 5 MG TABS (AMLODIPINE BESYLATE) Take 1 tablet by mouth once a day  #30 x 3   Entered and Authorized by:   Tula Nakayama MD   Signed by:   Tula Nakayama MD on 02/26/2010   Method used:   Electronically to        [facility] (retail)       [facility] [address]       [place], [place]  [address]       Ph: [phone]       Fax: [phone]   RxID:   [id number]    Orders Added: 1)  Est. Patient Level IV [id number] 2)  T-Basic Metabolic Panel 0000000 3)  T- Hemoglobin A1C [83036-23375] 4)  T-Basic Metabolic Panel 0000000 5)  T-Lipid Profile [80061-22930] 6)  T-Hepatic Function [80076-22960] 7)  T- Hemoglobin A1C [83036-23375] 8)  T-Urine Microalbumin w/creat. ratio [82043-[address]] 9)  Influenza Vaccine MCR [00025]   Immunizations Administered:  Influenza Vaccine # 1:     Vaccine Type: Fluvax MCR    Site: right deltoid    Mfr: novartis    Dose: 0.5 ml  Route: IM    Given by: Baldomero Lamy LPN    Exp. Date: 09/2010    Lot #: [id number]    VIS given: 12/02/09 version given February 26, 2010.   Immunizations Administered:  Influenza Vaccine # 1:    Vaccine Type: Fluvax MCR    Site: right deltoid    Mfr: novartis    Dose: 0.5 ml    Route: IM    Given by: Baldomero Lamy LPN    Exp. Date: 09/2010    Lot #: [id number]    VIS given: 12/02/09 version given February 26, 2010.

## 2010-06-11 NOTE — Letter (Signed)
Summary: History form  History form   Imported By: Ruffin Pyo 04/27/2010 14:30:01  _____________________________________________________________________  External Attachment:    Type:   Image     Comment:   External Document

## 2010-06-11 NOTE — Letter (Signed)
Summary: 1st missed letter  1st missed letter   Imported By: Dierdre Harness 04/24/2010 14:06:41  _____________________________________________________________________  External Attachment:    Type:   Image     Comment:   External Document

## 2010-06-11 NOTE — Letter (Signed)
Summary: Surgery order LT knee sched 05/29/10  Surgery order LT knee sched 05/29/10   Imported By: Ihor Austin 05/13/2010 11:47:02  _____________________________________________________________________  External Attachment:    Type:   Image     Comment:   External Document

## 2010-06-11 NOTE — Progress Notes (Signed)
Summary: MRI appointment  Phone Note Outgoing Call   Call placed by: Santo Held,  April 24, 2010 11:07 AM Call placed to: Patient Action Taken: Appt scheduled Summary of Call: I called to give the patient her MRI appointment at [facility] on 04-28-10 at 8:30. Patient has [employer], no precert needed per Elmyra Ricks C. Patient will follow up here for results.

## 2010-06-11 NOTE — Miscellaneous (Signed)
Summary: No pre-cert required for out-patient procedure  Clinical Lists Changes   Contacted insurer, [employer] / [employer] healthcare network at [facility] [phone], re: out-patient surgery scheduled for Lt knee on 05/29/10 at [facility] , [place], [phone].  Verified eligibility (pt's coverage in effect from 11/08/95 and current) - per phone w/Kimberly, who verified no pre-cert is required for out-patient surgery for in-network providers.

## 2010-06-11 NOTE — Assessment & Plan Note (Signed)
Summary: POST OP 1/LT KNEE ARTHROS/SURG 05/29/10/[employer]/CAF   Visit Type:  POST OP Referring Provider:  Moshe Cipro Primary Provider:  Moshe Cipro  CC:  post op left knee.  History of Present Illness: I saw Kathleen [name] in the office today for a followup visit.  She is a 60 years old woman with the complaint of:  post op #1, left knee  Post op #1  DOS 05-29-10.  Procedure: Arthroscopy of left knee, partial lateral meniscectomy, minimal debridement.  Medications: Lorcet Plus and Phenergan.  Complaints: No problems, just soreness.  doing well   incisions look good took sutures out     Allergies: No Known Drug Allergies   Impression & Recommendations:  Problem # 1:  LATERAL MENISCUS TEAR, LEFT (ICD-836.1) Assessment Improved  Orders: Post-Op Check [id number])  Patient Instructions: 1)  FEB 13th come back    Orders Added: 1)  Post-Op Check [id number]

## 2010-06-11 NOTE — Assessment & Plan Note (Signed)
Summary: NEW PROBLEM/LT KNEE PAIN/NEED XRAY/DOPPLER [facility] 04/08/10/REF U...   Visit Type:  new patient Referring Lucia Harm:  Moshe Cipro Primary Chonte Ricke:  Moshe Cipro  CC:  left knee pain.  History of Present Illness: I saw Kathleen [name] in the office today for an initial visit.  She is a 60 years old woman with the complaint of:  left knee pain x1 month  Xrays today.  Medications: Nabumetone 750 mg, Glyburide-Metformin, Lisinopril/HCTZ, Allodopine, Timazepam.  This is a 60 year old female, who has had pain in her LEFT knee for one month with stabbing intermittent medial knee pain popping, inability to stand from a seated position. Pain is unrelieved by anti-inflammatories. The pain level is 5/10. No locking or giving out symptoms have been noted in the knee. but hse has noticed swelling   Knee Exam  General:    Well-developed, well-nourished, normal body habitus; no deformities, normal grooming.  Gait:    limp noted-left.    Vascular:    There was no swelling or varicose veins. The pulses and temperature are normal. There was no edema or tenderness.  Sensory:    Gross coordination and sensation were normal.    Motor:    Motor strength 5/5 bilaterally for quadriceps, hamstrings, ankle dorsiflexion, and ankle plantar flexion.    Reflexes:    Normal and symmetric patellar and Achilles reflexes bilaterally.    Knee Exam:    Right:    Inspection:  Normal    Palpation:  Normal    Left:    Inspection:  Abnormal    Palpation:  Abnormal    Stability:  stable    Tenderness:  medial joint line tenderness    Swelling:  positive effusion, 1+    range of motion equals flexion, 125, extension -5  McMurrays positive   J sign:    Left negative Patellofemoral joint crepitus:    Left positive Anterior drawer:    Right negative; Left negative Posterior drawer:    Right negative; Left negative   Current Medications (verified): 1)  Glyburide-Metformin 5-500 Mg  Tabs  (Glyburide-Metformin) .... 2 By Mouth Two Times A Day 2)  Low-Dose Aspirin 81 Mg  Tabs (Aspirin) .Marland Kitchen.. 1 By Mouth Once Daily 3)  Tylenol Arthritis Pain 650 Mg  Tbcr (Acetaminophen) .Marland Kitchen.. 1-2 By Mouth Three Times A Day As Needed Pain 4)  Gabapentin 300 Mg  Caps (Gabapentin) .Marland Kitchen.. 1 By Mouth Qam and 2 By Mouth At Bedtime 5)  Cetirizine Hcl 10 Mg Tabs (Cetirizine Hcl) .Marland Kitchen.. 1 By Mouth Once Daily 6)  Omeprazole 20 Mg Cpdr (Omeprazole) .Marland Kitchen.. 1 By Mouth Once Daily 7)  [employer] Pen Needles 31g X 8 Mm Misc (Insulin Pen Needle) .... Use Daily With Lantus 8)  Lisinopril-Hydrochlorothiazide 20-12.5 Mg Tabs (Lisinopril-Hydrochlorothiazide) .... Two Tabs Once Daily 9)  Freestyle Lite Test  Strp (Glucose Blood) .... For Twice Daily Testing 10)  Freestyle Lancets  Misc (Lancets) .... Use As Directed Three Times A Day 11)  Januvia 100 Mg Tabs (Sitagliptin Phosphate) .... Take 1 Tablet By Mouth Once A Day 12)  Catapres 0.2 Mg Tabs (Clonidine Hcl) .... Take 1 Tab By Mouth At Bedtime 13)  Pravastatin Sodium 40 Mg Tabs (Pravastatin Sodium) .... Take 1 Tab By Mouth At Bedtime 14)  Temazepam 15 Mg Caps (Temazepam) .... Take 1 Capsule By Mouth At Bedtime 15)  Lantus Solostar 100 Unit/ml Soln (Insulin Glargine) .... 32 Units Subcutaneously At Bedtime 16)  Vimovo 500-20 Mg Tbec (Naproxen-Esomeprazole) .... Take 1 Tablet By Mouth Two Times  A Day For 2 Weeks , Then As Needed 17)  Amlodipine Besylate 5 Mg Tabs (Amlodipine Besylate) .... Take 1 Tablet By Mouth Once A Day  Allergies (verified): No Known Drug Allergies  Past History:  Past Medical History: Last updated: 12/04/09 Diabetes mellitus, type II  approxsince 2000 GERD Hypertension  approx since 1995  Low back pain Hyperlipidemia  approx 2000 Obesity INSOMNIA (ICD-780.52) DYSPHAGIA UNSPECIFIED (ICD-787.20) ANEMIA (ICD-285.9)  Past Surgical History: Last updated: 11/24/2007 Cholecystectomy-2007 Tubal ligation tenotomy 2,3,4 left foot left carpal tunnel  release  Family History: Last updated: Dec 04, 2009 father-80-?DM mother-deceased 72-DM, HTN, MI sister-53-DM sister-57 brother-deceased-52-DM brother-deceased-37-murder son-41 son-38 son-35 son-34  Social History: Last updated: December 04, 2009 Divorced lives with 2nd oldest son Never Smoked Alcohol use-no Drug use-no Occupation: works as full time Engineer, petroleum  Risk Factors: Smoking Status: never (03/26/2009)  Review of Systems Musculoskeletal:  Complains of swelling; denies joint pain, instability, stiffness, redness, heat, and muscle pain.  The review of systems is negative for Constitutional, Cardiovascular, Respiratory, Gastrointestinal, Genitourinary, Neurologic, Endocrine, Psychiatric, Skin, HEENT, Immunology, and Hemoatologic.  Physical Exam  Skin:  intact without lesions or rashes Psych:  alert and cooperative; normal mood and affect; normal attention span and concentration Additional Exam:   The upper extremities have normal appearance, ROM, strength and stability.    Impression & Recommendations:  Problem # 1:  TEAR MEDIAL MENISCUS (ICD-836.0) Assessment New  x-rays of the LEFT knee 3 views.  Findings. mild OA medial side of the knee   Impression Mild OA   rec mri based on the following: medial knee pain, joint line tenderness, Mc Murrays, Giving out, no relief with nsaids and activity modification   Orders: New Patient Level IV [id number]) Knee x-ray,  3 views [id number])  Patient Instructions: 1)  MRI return with results    Orders Added: 1)  New Patient Level IV [id number] 2)  Knee x-ray,  3 views [id number]

## 2010-06-11 NOTE — Assessment & Plan Note (Signed)
Summary: MRI results from [facility]   Visit Type:  Follow-up Referring Provider:  Moshe Cipro Primary Provider:  Moshe Cipro  CC:  mri results left knee.  History of Present Illness: I saw Kathleen [name] in the office today for a visit.  She is a 60 years old woman with the complaint of:  left knee pain  Medications: Nabumetone 750 mg, Glyburide-Metformin, Lisinopril/HCTZ, Allodopine, Timazepam, ASA.  This is a 60 year old female, who has had pain in her LEFT knee for one month with stabbing intermittent medial knee pain popping, inability to stand from a seated position. Pain is unrelieved by anti-inflammatories. The pain level is 5/10. No locking or giving out symptoms have been noted in the knee. but hse has noticed swelling   Pain level today is a 10 with her knee.  Today she is here for MRI results left knee taken [facility] 04/28/10.  IMPRESSION:   1.  Degenerative horizontal tear involving the anterior horn of the lateral meniscus. 2.  Intact medial meniscus.  No acute ligamentous findings. 3.  Tricompartmental degenerative changes as described.  No acute osteochondral findings. 4.  Possible small ganglia deep to the medial patellar retinaculum may contribute to anterior medial pain.    Allergies (verified): No Known Drug Allergies  Past History:  Past Medical History: Last updated: 11-29-09 Diabetes mellitus, type II  approxsince 2000 GERD Hypertension  approx since 1995  Low back pain Hyperlipidemia  approx 2000 Obesity INSOMNIA (ICD-780.52) DYSPHAGIA UNSPECIFIED (ICD-787.20) ANEMIA (ICD-285.9)  Past Surgical History: Last updated: 11/24/2007 Cholecystectomy-2007 Tubal ligation tenotomy 2,3,4 left foot left carpal tunnel release  Family History: Last updated: 2009/11/29 father-80-?DM mother-deceased 72-DM, HTN, MI sister-53-DM sister-57 brother-deceased-52-DM brother-deceased-37-murder son-41 son-38 son-35 son-34  Social History: Last updated:  Nov 29, 2009 Divorced lives with 2nd oldest son Never Smoked Alcohol use-no Drug use-no Occupation: works as full time Engineer, [employer]  Risk Factors: Smoking Status: never (03/26/2009)  Review of Systems Constitutional:  Denies weight loss, weight gain, fever, chills, and fatigue. Cardiovascular:  Denies chest pain, palpitations, fainting, and murmurs. Respiratory:  Denies short of breath, wheezing, couch, tightness, pain on inspiration, and snoring . Gastrointestinal:  Denies heartburn, nausea, vomiting, diarrhea, constipation, and blood in your stools. Genitourinary:  Denies frequency, urgency, difficulty urinating, painful urination, flank pain, and bleeding in urine. Neurologic:  Denies numbness, tingling, unsteady gait, dizziness, tremors, and seizure. Musculoskeletal:  See HPI. Endocrine:  Denies excessive thirst, exessive urination, and heat or cold intolerance. Psychiatric:  Denies nervousness, depression, anxiety, and hallucinations. Skin:  Denies changes in the skin, poor healing, rash, itching, and redness. HEENT:  Denies blurred or double vision, eye pain, redness, and watering. Immunology:  Denies seasonal allergies, sinus problems, and allergic to bee stings. Hemoatologic:  Denies easy bleeding and brusing.  Physical Exam  Additional Exam:  GEN: well developed, well nourished, normal grooming and hygiene, no deformity and normal body habitus.   CDV: pulses are normal, no edema, no erythema. no tenderness  Lymph: normal lymph nodes   Skin: no rashes, skin lesions or open sores   NEURO: normal coordination, reflexes, sensation.   Psyche: awake, alert and oriented. Mood normal   Gait: abnormal favoring the left knee   Inspection: LEFT knee LA TERAL joint line tenderness, mild swelling.  Range of motion total 120  Motor exam grade 5.  Knee stable.  McMurray sign, positive.   The upper extremities have normal appearance, ROM, strength and  stability.     Impression & Recommendations:  Problem # 1:  TEAR MEDIAL  MENISCUS (ICD-836.0) PLAN:  SALK PARTIAL  LATERAL MENISECTOMY, DEBRIDEMENT   Other Orders: Est. Patient Level IV [id number])  Patient Instructions: 1)  YOU ARE HAVING SURGERY ON YOUR LEFT KNEE  2)  20TH JAN DOS  3)  23RD JAN F/U    Orders Added: 1)  Est. Patient Level IV [id number]

## 2010-06-11 NOTE — Letter (Signed)
Summary: Letter out of work   [facility]  [address] [place]  [place], [place] [address]   Phone: [phone]  Fax: [phone]    [id number]   [address]ie [id number] [place], [place]  [address]   Dear Ms. Gauntt, and To Whom This May Concern:   The above named patient/employee, due to medical reasons and secondary to surgery scheduled, will be out of work as follows:  Start:  05/29/10  End:    07/10/10  (approximate)   Estimated return to work:  07/13/10          Sincerely,    Demetrius Revel, MD

## 2010-06-11 NOTE — Letter (Signed)
Summary: *Orthopedic Consult Note  [facility]  [address] [place]  [place], [place] [address]   Phone: [phone]  Fax: [phone]    Re:    Kathleen [name] DOB:    December 23, 1950   Dear: Dr Jomarie Longs    Thank you for requesting that we see the above patient for consultation.  A copy of the detailed office note will be sent under separate cover, for your review.  Evaluation today is consistent with: torn medial meniscus with underlying arthritis.  Our recommendations for MRI of the LEFT knee in preparation for arthroscopic knee surgery and partial medial meniscectomy. If MRI confirms our clinical suspicion.   Thank you for this opportunity to look after your patient.  Sincerely,   Demetrius Revel. MD.

## 2010-06-12 ENCOUNTER — Ambulatory Visit (HOSPITAL_COMMUNITY)
Admission: RE | Admit: 2010-06-12 | Discharge: 2010-06-12 | Disposition: A | Discharge status: Home / Self Care | Payer: 59 | Source: Ambulatory Visit | Attending: Family Medicine | Admitting: Family Medicine

## 2010-06-12 ENCOUNTER — Encounter: Payer: Self-pay | Admitting: Orthopedic Surgery

## 2010-06-12 DIAGNOSIS — IMO0001 Reserved for inherently not codable concepts without codable children: Secondary | ICD-10-CM | POA: Insufficient documentation

## 2010-06-12 DIAGNOSIS — M6281 Muscle weakness (generalized): Secondary | ICD-10-CM | POA: Insufficient documentation

## 2010-06-12 DIAGNOSIS — M25569 Pain in unspecified knee: Secondary | ICD-10-CM | POA: Insufficient documentation

## 2010-06-12 DIAGNOSIS — R262 Difficulty in walking, not elsewhere classified: Secondary | ICD-10-CM | POA: Insufficient documentation

## 2010-06-12 DIAGNOSIS — M25669 Stiffness of unspecified knee, not elsewhere classified: Secondary | ICD-10-CM | POA: Insufficient documentation

## 2010-06-15 ENCOUNTER — Ambulatory Visit (HOSPITAL_COMMUNITY)
Admission: RE | Admit: 2010-06-15 | Discharge: 2010-06-15 | Disposition: A | Discharge status: Home / Self Care | Payer: 59 | Source: Ambulatory Visit | Attending: Physical Therapy | Admitting: Physical Therapy

## 2010-06-17 ENCOUNTER — Encounter: Payer: Self-pay | Admitting: Orthopedic Surgery

## 2010-06-17 ENCOUNTER — Ambulatory Visit (HOSPITAL_COMMUNITY): Payer: Self-pay

## 2010-06-17 NOTE — Letter (Signed)
Summary: [employer] disab form  [place] disab form   Imported By: Ihor Austin 06/12/2010 09:36:08  _____________________________________________________________________  External Attachment:    Type:   Image     Comment:   External Document

## 2010-06-17 NOTE — Letter (Signed)
Summary: FMLA form  FMLA form   Imported By: Ihor Austin 06/10/2010 15:03:23  _____________________________________________________________________  External Attachment:    Type:   Image     Comment:   External Document

## 2010-06-17 NOTE — Letter (Signed)
Summary: External Other  External Other   Imported By: Ihor Austin 06/12/2010 09:36:30  _____________________________________________________________________  External Attachment:    Type:   Image     Comment:   External Document

## 2010-06-19 ENCOUNTER — Encounter: Payer: Self-pay | Admitting: Orthopedic Surgery

## 2010-06-19 ENCOUNTER — Ambulatory Visit (HOSPITAL_COMMUNITY)
Admission: RE | Admit: 2010-06-19 | Discharge: 2010-06-19 | Disposition: A | Discharge status: Home / Self Care | Payer: 59 | Source: Ambulatory Visit

## 2010-06-22 ENCOUNTER — Ambulatory Visit (INDEPENDENT_AMBULATORY_CARE_PROVIDER_SITE_OTHER): Payer: Managed Care, Other (non HMO) | Admitting: Orthopedic Surgery

## 2010-06-22 ENCOUNTER — Encounter: Payer: Self-pay | Admitting: Orthopedic Surgery

## 2010-06-22 DIAGNOSIS — Z9889 Other specified postprocedural states: Secondary | ICD-10-CM

## 2010-06-23 ENCOUNTER — Ambulatory Visit (HOSPITAL_COMMUNITY)
Admission: RE | Admit: 2010-06-23 | Discharge: 2010-06-23 | Disposition: A | Discharge status: Home / Self Care | Payer: 59 | Source: Ambulatory Visit

## 2010-06-24 ENCOUNTER — Ambulatory Visit (HOSPITAL_COMMUNITY)
Admission: RE | Admit: 2010-06-24 | Discharge: 2010-06-24 | Disposition: A | Discharge status: Home / Self Care | Payer: 59 | Source: Ambulatory Visit

## 2010-06-25 NOTE — Miscellaneous (Signed)
Summary: PT clinical evaluation  PT clinical evaluation   Imported By: Ruffin Pyo 06/17/2010 15:06:13  _____________________________________________________________________  External Attachment:    Type:   Image     Comment:   External Document

## 2010-06-26 ENCOUNTER — Telehealth: Payer: Self-pay | Admitting: Family Medicine

## 2010-06-26 ENCOUNTER — Ambulatory Visit (HOSPITAL_COMMUNITY)
Admission: RE | Admit: 2010-06-26 | Discharge: 2010-06-26 | Disposition: A | Discharge status: Home / Self Care | Payer: 59 | Source: Ambulatory Visit

## 2010-06-29 ENCOUNTER — Ambulatory Visit (HOSPITAL_COMMUNITY): Payer: Managed Care, Other (non HMO)

## 2010-07-01 ENCOUNTER — Ambulatory Visit (HOSPITAL_COMMUNITY)
Admission: RE | Admit: 2010-07-01 | Discharge: 2010-07-01 | Disposition: A | Discharge status: Home / Self Care | Payer: 59 | Source: Ambulatory Visit | Attending: *Deleted | Admitting: *Deleted

## 2010-07-01 NOTE — Letter (Signed)
Summary: Out of Work  [facility]  [address] [place]  [place], [place] [address]   Phone: [phone]  Fax: [phone]    February 13, 0000000   Employee:  Kathleen [name] [facility] [employer]    To Whom It May Concern:   For Medical reasons, please excuse the above named employee from work for the following dates:  Start:   05/29/10  End/Medically cleared to return to work, full duty, no restrictions:   07/09/10   If you need additional information, please feel free to contact our office.         Sincerely,    Demetrius Revel, MD

## 2010-07-01 NOTE — Assessment & Plan Note (Signed)
Summary: POST OP@ L KNEE SURG 05/29/10/[facility] THIS DATE PER DR. Lamoyne Palencia   Visit Type:  Follow-up Referring Provider:  Moshe Cipro Primary Provider:  Moshe Cipro  CC:  post op left knee.  History of Present Illness: I saw Kathleen [name] in the office today for a followup visit.  She is a 60 years old woman with the complaint of:  post op #2, left knee  DOS 05-29-10.  POD 24  Procedure: Arthroscopy of left knee, partial lateral meniscectomy, minimal debridement.  Medications: Lorcet Plus and Phenergan.  OPERATIVE FINDINGS:  She did have a torn lateral meniscus, but it was a posterior horn tear with a displaced meniscal fragment in the notch just anterior to the anterior horn of the lateral meniscus.  She had a chondral lesion of the lateral femoral condyle and she had a chondral flap tear of the medial femoral condyle and a large amount of synovitis. Chondral surfaces on the medial side were degenerative grade 2 with flap tear and then on the lateral side, she had a grade 3 chondral lesion. No flap, patellofemoral compartment had mild changes, synovitis was noted medially and anteriorly.  No chondral changes were noted on the tibial surfaces.  Today is recheck after PT.    Seems to be going better. Has a pain level of 4 with medial soreness and stiffness. Therapy notes indicate 120 of knee flexion and also indicates knee for approximately 9 more visits starting tomorrow.     Allergies: No Known Drug Allergies  Physical Exam  Additional Exam:   The knee looks pretty good. Minimal tenderness minimal swelling. She can bring the knee to full extension with no extensor lag.     Other Orders: Post-Op Check [id number])  Patient Instructions: 1)  finish therapy  2)  exercise at home  3)  wean yourself off the pain medication;  4)  return as needed   Orders Added: 1)  Post-Op Check [id number]

## 2010-07-01 NOTE — Progress Notes (Signed)
Summary: medicine  Phone Note Call from Patient   Summary of Call: needs her lisinopril sent to [facility] Initial call taken by: Dierdre Harness,  June 26, 2010 9:57 AM    Prescriptions: LISINOPRIL-HYDROCHLOROTHIAZIDE 20-12.5 MG TABS (LISINOPRIL-HYDROCHLOROTHIAZIDE) Two tabs once daily  #60 Each x 0   Entered by:   Baldomero Lamy LPN   Authorized by:   Tula Nakayama MD   Signed by:   Baldomero Lamy LPN on [id number]   Method used:   Electronically to        [facility]       [facility] [address] [place], [place]  [address]       Ph: [phone]       Fax: [phone]   RxID:   [id number]

## 2010-07-03 ENCOUNTER — Ambulatory Visit (HOSPITAL_COMMUNITY)
Admission: RE | Admit: 2010-07-03 | Discharge: 2010-07-03 | Disposition: A | Discharge status: Home / Self Care | Payer: 59 | Source: Ambulatory Visit | Admitting: Physical Therapy

## 2010-07-06 ENCOUNTER — Telehealth: Payer: Self-pay | Admitting: Orthopedic Surgery

## 2010-07-06 ENCOUNTER — Encounter: Payer: Self-pay | Admitting: Orthopedic Surgery

## 2010-07-07 ENCOUNTER — Ambulatory Visit (HOSPITAL_COMMUNITY): Payer: Managed Care, Other (non HMO)

## 2010-07-07 NOTE — Miscellaneous (Signed)
Summary: Rehab Report  Rehab Report   Imported By: Ihor Austin 06/29/2010 17:07:38  _____________________________________________________________________  External Attachment:    Type:   Image     Comment:   External Document

## 2010-07-08 ENCOUNTER — Ambulatory Visit (INDEPENDENT_AMBULATORY_CARE_PROVIDER_SITE_OTHER): Payer: Managed Care, Other (non HMO) | Admitting: Family Medicine

## 2010-07-08 ENCOUNTER — Encounter: Payer: Self-pay | Admitting: Family Medicine

## 2010-07-08 ENCOUNTER — Ambulatory Visit (HOSPITAL_COMMUNITY): Payer: Managed Care, Other (non HMO)

## 2010-07-08 DIAGNOSIS — IMO0002 Reserved for concepts with insufficient information to code with codable children: Secondary | ICD-10-CM

## 2010-07-08 DIAGNOSIS — IMO0001 Reserved for inherently not codable concepts without codable children: Secondary | ICD-10-CM

## 2010-07-08 DIAGNOSIS — B351 Tinea unguium: Secondary | ICD-10-CM | POA: Insufficient documentation

## 2010-07-08 DIAGNOSIS — I10 Essential (primary) hypertension: Secondary | ICD-10-CM

## 2010-07-08 DIAGNOSIS — M1710 Unilateral primary osteoarthritis, unspecified knee: Secondary | ICD-10-CM

## 2010-07-08 DIAGNOSIS — K219 Gastro-esophageal reflux disease without esophagitis: Secondary | ICD-10-CM

## 2010-07-09 ENCOUNTER — Ambulatory Visit (INDEPENDENT_AMBULATORY_CARE_PROVIDER_SITE_OTHER): Payer: Managed Care, Other (non HMO) | Admitting: Orthopedic Surgery

## 2010-07-09 ENCOUNTER — Encounter: Payer: Self-pay | Admitting: Orthopedic Surgery

## 2010-07-09 DIAGNOSIS — S83289A Other tear of lateral meniscus, current injury, unspecified knee, initial encounter: Secondary | ICD-10-CM

## 2010-07-09 DIAGNOSIS — IMO0002 Reserved for concepts with insufficient information to code with codable children: Secondary | ICD-10-CM

## 2010-07-09 DIAGNOSIS — Z9889 Other specified postprocedural states: Secondary | ICD-10-CM

## 2010-07-09 DIAGNOSIS — M1710 Unilateral primary osteoarthritis, unspecified knee: Secondary | ICD-10-CM

## 2010-07-13 ENCOUNTER — Encounter: Payer: Self-pay | Admitting: Family Medicine

## 2010-07-15 ENCOUNTER — Ambulatory Visit (INDEPENDENT_AMBULATORY_CARE_PROVIDER_SITE_OTHER): Payer: Managed Care, Other (non HMO) | Admitting: Orthopedic Surgery

## 2010-07-15 ENCOUNTER — Encounter: Payer: Self-pay | Admitting: Orthopedic Surgery

## 2010-07-15 DIAGNOSIS — IMO0002 Reserved for concepts with insufficient information to code with codable children: Secondary | ICD-10-CM

## 2010-07-15 DIAGNOSIS — Z9889 Other specified postprocedural states: Secondary | ICD-10-CM

## 2010-07-15 DIAGNOSIS — M1710 Unilateral primary osteoarthritis, unspecified knee: Secondary | ICD-10-CM

## 2010-07-15 DIAGNOSIS — S83289A Other tear of lateral meniscus, current injury, unspecified knee, initial encounter: Secondary | ICD-10-CM

## 2010-07-16 LAB — CONVERTED CEMR LAB
Creatinine, Ser: 0.89 mg/dL (ref 0.40–1.20)
Glucose, Bld: 158 mg/dL — ABNORMAL HIGH (ref 70–99)
Sodium: 137 meq/L (ref 135–145)

## 2010-07-16 NOTE — Letter (Signed)
Summary: temporary handicapped sticker  temporary handicapped sticker   Imported By: Ihor Austin 07/07/2010 14:10:13  _____________________________________________________________________  External Attachment:    Type:   Image     Comment:   External Document

## 2010-07-16 NOTE — Letter (Signed)
Summary: Out of Work  [facility]  [address] [place]  [place], [place] [address]   Phone: [phone]  Fax: [phone]    March  1, 0000000   Employee:  DORINNE WESBY Benner    To Whom It May Concern:  Above patient/employee was seen in our office for an appointment today at 8:30a.m.  For Medical reasons, please excuse the employee from work for the following dates:  Start:   05/29/10  End:   07/16/10 Or until further notice             Next scheduled appointment:  07/16/10   If you need additional information, please feel free to contact our office.         Sincerely,    Demetrius Revel, MD

## 2010-07-16 NOTE — Assessment & Plan Note (Signed)
Summary: reck knee having alot of pain,popping ?xr/post op  05/29/10/ci...   Visit Type:  Follow-up Referring Provider:  Moshe Cipro Primary Provider:  Moshe Cipro   History of Present Illness: I saw Kathleen [name] in the office today for a followup visit.  She is a 60 years old woman with the complaint of:  left knee pain   DOS 05-29-10.  Procedure: Arthroscopy of left knee, partial lateral meniscectomy, minimal debridement.  Medications: Lorcet Plus;   Complaints: She states she is still having some pain and popping in her knee.  Dr. Moshe Cipro gave her a shot in her hip yesterday for pain, helped her knee some.      Allergies: No Known Drug Allergies  Physical Exam  Additional Exam:  LEFT knee.  The patient is tender over the medial side of the LEFT knee below the joint line. She has some mild swelling in the joint itself. But her primary area of pain is over the pes anserine tendons . she has painless ROM  No joint laxity  motor exam is normal   normal pulse and no sensory loss    Impression & Recommendations:  Problem # 1:  ARTHROSCOPY, LEFT KNEE, HX OF (ICD-V45.89) Assessment Comment Only  Orders: Post-Op Check [id number])  Problem # 2:  OSTEOARTHRITIS, KNEE, LEFT (ICD-715.96) Assessment: Comment Only  Her updated medication list for this problem includes:    Low-dose Aspirin 81 Mg Tabs (Aspirin) .Marland Kitchen... 1 by mouth once daily    Tylenol Arthritis Pain 650 Mg Tbcr (Acetaminophen) .Marland Kitchen... 1-2 by mouth three times a day as needed pain    Vimovo 500-20 Mg Tbec (Naproxen-esomeprazole) .Marland Kitchen... Take 1 tablet by mouth two times a day for 2 weeks , then as needed    Nabumetone 750 Mg Tabs (Nabumetone) .Marland Kitchen... Take 1 tablet by mouth two times a day    Hydrocodone-acetaminophen 7.5-325 Mg Tabs (Hydrocodone-acetaminophen) .Marland Kitchen... 1-2 tabs every 4 hrs as needed  Orders: Post-Op Check [id number])  Problem # 3:  LATERAL MENISCUS TEAR, LEFT (ICD-836.1) Assessment: Comment Only  Orders: Post-Op  Check [id number])  Problem # 4:  PES ANSERINUS TENDINITIS OR BURSITIS (ICD-726.61) Assessment: New  LEFT knee injection/medial bursal injection Verbal consent was obtained. The left knee was prepped with alcohol and ethyl chloride. 1 cc of depomedrol 40mg /cc and 4 cc of lidocaine 1% was injected. there were no complications.  Orders: Post-Op Check [id number]) Joint Aspirate / Injection, Large (20610) Depo- Medrol 40mg  (J1030)  Patient Instructions: 1)  Ibuprofen 800 mg three times a day  2)  Ice 3 x day  3)  OOW x 7 days  4)  return in 7 days    Orders Added: 1)  Post-Op Check [[id number]] 2)  Joint Aspirate / Injection, Large [id number] 3)  Depo- Medrol 40mg  [id number]

## 2010-07-16 NOTE — Progress Notes (Signed)
Summary: patient asking about temporary sticker  Phone Note Call from Patient   Caller: Patient Summary of Call: Patient called to request a temporary handicapped sticker for her return to work, which is 07/09/10, as she states that the parking lot is a far distance from her job. Please advise.  (Home ph # [phone])  Initial call taken by: Ihor Austin,  July 06, 2010 11:37 AM  Follow-up for Phone Call        approved Follow-up by: Arther Abbott MD,  July 06, 2010 12:23 PM  Additional Follow-up for Phone Call Additional follow up Details #1::        Called patient, advised. She will pick up tomorrow. Additional Follow-up by: Ihor Austin,  July 06, 2010 4:43 PM

## 2010-07-21 NOTE — Assessment & Plan Note (Signed)
Summary: F UP   Vital Signs:  Patient profile:   60 year old female Menstrual status:  postmenopausal Height:      66.5 inches Weight:      237 pounds BMI:     37.82 O2 Sat:      97 % Pulse rate:   103 / minute Pulse rhythm:   regular Resp:     16 per minute BP sitting:   150 / 84  Vitals Entered By: Kate Sable LPN (February 29, X33443 2:53 PM)  Nutrition Counseling: Patient's BMI is greater than 25 and therefore counseled on weight management options. CC: Follow up chronic problems, supposed to go back to work but her left knee is still hurting her  Pain Assessment Patient in pain? yes     Location: left knee Intensity: 5 Type: aching Onset of pain  since surgery   Primary Care Provider:  Moshe Cipro  CC:  Follow up chronic problems and supposed to go back to work but her left knee is still hurting her .  History of Present Illness: Pt in for routine f/u . of her chronic medical; problems. She c/o popping and uncontrolled pain in the left knee which was operated on this year. She is to return to work tomorrow, I have advised her to contact orhtio regarding her problem Reports  that she is otherwise doing fairly well Denies recent fever or chills. Denies sinus pressure, nasal congestion , ear pain or sore throat. Denies chest congestion, or cough productive of sputum. Denies chest pain, palpitations, PND, orthopnea or leg swelling. Denies abdominal pain, nausea, vomitting, diarrhea or constipation. Denies change in bowel movements or bloody stool. Denies dysuria , frequency, incontinence or hesitancy.  Denies headaches, vertigo, seizures. Denies depression, anxiety or insomnia. Denies  rash, lesions, or itch.     Current Medications (verified): 1)  Glyburide-Metformin 5-500 Mg  Tabs (Glyburide-Metformin) .... 2 By Mouth Two Times A Day 2)  Low-Dose Aspirin 81 Mg  Tabs (Aspirin) .Marland Kitchen.. 1 By Mouth Once Daily 3)  Tylenol Arthritis Pain 650 Mg  Tbcr (Acetaminophen) .Marland Kitchen..  1-2 By Mouth Three Times A Day As Needed Pain 4)  Gabapentin 300 Mg  Caps (Gabapentin) .Marland Kitchen.. 1 By Mouth Qam and 2 By Mouth At Bedtime 5)  Cetirizine Hcl 10 Mg Tabs (Cetirizine Hcl) .Marland Kitchen.. 1 By Mouth Once Daily 6)  Omeprazole 20 Mg Cpdr (Omeprazole) .Marland Kitchen.. 1 By Mouth Once Daily 7)  [employer] Pen Needles 31g X 8 Mm Misc (Insulin Pen Needle) .... Use Daily With Lantus 8)  Lisinopril-Hydrochlorothiazide 20-12.5 Mg Tabs (Lisinopril-Hydrochlorothiazide) .... Two Tabs Once Daily 9)  Freestyle Lite Test  Strp (Glucose Blood) .... For Twice Daily Testing 10)  Freestyle Lancets  Misc (Lancets) .... Use As Directed Three Times A Day 11)  Januvia 100 Mg Tabs (Sitagliptin Phosphate) .... Take 1 Tablet By Mouth Once A Day 12)  Catapres 0.2 Mg Tabs (Clonidine Hcl) .... Take 1 Tab By Mouth At Bedtime 13)  Pravastatin Sodium 40 Mg Tabs (Pravastatin Sodium) .... Take 1 Tab By Mouth At Bedtime 14)  Temazepam 15 Mg Caps (Temazepam) .... Take 1 Capsule By Mouth At Bedtime 15)  Lantus Solostar 100 Unit/ml Soln (Insulin Glargine) .... 32 Units Subcutaneously At Bedtime 16)  Vimovo 500-20 Mg Tbec (Naproxen-Esomeprazole) .... Take 1 Tablet By Mouth Two Times A Day For 2 Weeks , Then As Needed 17)  Amlodipine Besylate 5 Mg Tabs (Amlodipine Besylate) .... Take 1 Tablet By Mouth Once A Day 18)  Nabumetone  750 Mg Tabs (Nabumetone) .... Take 1 Tablet By Mouth Two Times A Day 19)  Hydrocodone-Acetaminophen 7.5-325 Mg Tabs (Hydrocodone-Acetaminophen) .Marland Kitchen.. 1-2 Tabs Every 4 Hrs As Needed  Allergies (verified): No Known Drug Allergies  Past History:  Past medical, surgical, family and social histories (including risk factors) reviewed, and no changes noted (except as noted below).  Past Medical History: Reviewed history from 11/24/2009 and no changes required. Diabetes mellitus, type II  approxsince 2000 GERD Hypertension  approx since 1995  Low back pain Hyperlipidemia  approx 2000 Obesity INSOMNIA (ICD-780.52) DYSPHAGIA  UNSPECIFIED (ICD-787.20) ANEMIA (ICD-285.9)  Past Surgical History: Cholecystectomy-2007 Tubal ligation tenotomy 2,3,4 left foot left carpal tunnel release  Left Knee surgery 05/29/2010  Family History: Reviewed history from 11/24/2009 and no changes required. father-80-?DM mother-deceased 72-DM, HTN, MI sister-53-DM sister-57 brother-deceased-52-DM brother-deceased-37-murder son-41 son-38 son-35 son-34  Social History: Reviewed history from 11/24/2009 and no changes required. Divorced lives with 2nd oldest son Never Smoked Alcohol use-no Drug use-no Occupation: works as full time Engineer, [employer]  Review of Systems      See HPI General:  Complains of fatigue. Eyes:  Denies discharge, eye pain, and red eye. MS:  Complains of joint pain and stiffness. Derm:  Complains of changes in nail beds; infected, fungal left grt toe. Endo:  Denies excessive thirst and excessive urination; checks on average 4 times per week fasting, ranges between 110 to 120. Heme:  Denies abnormal bruising and bleeding. Allergy:  Denies hives or rash and itching eyes.  Physical Exam  General:  Well-developed,obese,in no acute distress; alert,appropriate and cooperative throughout examination HEENT: No facial asymmetry,  EOMI, No sinus tenderness, TM's Clear, oropharynx  pink and moist.   Chest: Clear to auscultation bilaterally.  CVS: S1, S2, No murmurs, No S3.   Abd: Soft, Nontender.  MS: Adequate ROM spine, hips, shoulders and reduced in left knee which is swollen Ext: No edema.   CNS: CN 2-12 intact, power tone and sensation normal throughout.   Skin: Intact, no visible lesions or rashes.  Psych: Good eye contact, normal affect.  Memory intact, not anxious or depressed appearing.   Diabetes Management Exam:    Foot Exam (with socks and/or shoes not present):       Sensory-Monofilament:          Left foot: diminished          Right foot: diminished       Inspection:          Left foot:  normal          Right foot: normal       Nails:          Left foot: fungal infection          Right foot: fungal infection   Impression & Recommendations:  Problem # 1:  ONYCHOMYCOSIS (ICD-110.1) Assessment Deteriorated  Future Orders: Podiatry Referral (Podiatry) ... 07/09/2010  Problem # 2:  OSTEOARTHRITIS, KNEE, LEFT (ICD-715.96) Assessment: Deteriorated  Her updated medication list for this problem includes:    Low-dose Aspirin 81 Mg Tabs (Aspirin) .Marland Kitchen... 1 by mouth once daily    Tylenol Arthritis Pain 650 Mg Tbcr (Acetaminophen) .Marland Kitchen... 1-2 by mouth three times a day as needed pain    Vimovo 500-20 Mg Tbec (Naproxen-esomeprazole) .Marland Kitchen... Take 1 tablet by mouth two times a day for 2 weeks , then as needed    Nabumetone 750 Mg Tabs (Nabumetone) .Marland Kitchen... Take 1 tablet by mouth two times a day    Hydrocodone-acetaminophen  7.5-325 Mg Tabs (Hydrocodone-acetaminophen) .Marland Kitchen... 1-2 tabs every 4 hrs as needed  Orders: Ketorolac-Toradol 15mg  [id number]) Admin of Therapeutic Inj  intramuscular or subcutaneous [id number])  Problem # 3:  OBESITY (ICD-278.00) Assessment: Unchanged  Ht: 66.5 (07/08/2010)   Wt: 237 (07/08/2010)   BMI: 37.82 (07/08/2010)  Problem # 4:  HYPERTENSION (ICD-401.9) Assessment: Deteriorated  The following medications were removed from the medication list:    Catapres 0.2 Mg Tabs (Clonidine hcl) .Marland Kitchen... Take 1 tab by mouth at bedtime Her updated medication list for this problem includes:    Lisinopril-hydrochlorothiazide 20-12.5 Mg Tabs (Lisinopril-hydrochlorothiazide) .Marland Kitchen..Marland Kitchen Two tabs once daily    Amlodipine Besylate 5 Mg Tabs (Amlodipine besylate) .Marland Kitchen... Take 1 tablet by mouth once a day    Clonidine Hcl 0.2 Mg Tabs (Clonidine hcl) .Marland Kitchen... Take 1 tab by mouth at bedtime  Orders: T-Basic Metabolic Panel (99991111) T-Basic Metabolic Panel (99991111)  BP today: 150/84 Prior BP: 144/80 (02/26/2010)  Prior 10 Yr Risk Heart Disease: Not enough information  (05/25/2007)  Labs Reviewed: K+: 4.6 (02/26/2010) Creat: : 1.07 (02/26/2010)   Chol: 165 (11/24/2009)   HDL: 49 (11/24/2009)   LDL: 96 (11/24/2009)   TG: 100 (11/24/2009)  Problem # 5:  DIABETES MELLITUS, TYPE II, UNCONTROLLED (ICD-250.02) Assessment: Deteriorated  The following medications were removed from the medication list:    Januvia 100 Mg Tabs (Sitagliptin phosphate) .Marland Kitchen... Take 1 tablet by mouth once a day Her updated medication list for this problem includes:    Glyburide-metformin 5-500 Mg Tabs (Glyburide-metformin) .Marland Kitchen... 2 by mouth two times a day    Low-dose Aspirin 81 Mg Tabs (Aspirin) .Marland Kitchen... 1 by mouth once daily    Lisinopril-hydrochlorothiazide 20-12.5 Mg Tabs (Lisinopril-hydrochlorothiazide) .Marland Kitchen..Marland Kitchen Two tabs once daily    Lantus Solostar 100 Unit/ml Soln (Insulin glargine) .Marland Kitchen... 32 units subcutaneously at bedtime  Orders: T- Hemoglobin A1C [id number]) T- Hemoglobin A1C [id number])  Labs Reviewed: Creat: 1.07 (02/26/2010)    Reviewed HgBA1c results: 7.1 (02/26/2010)  7.0 (11/24/2009)  Complete Medication List: 1)  Glyburide-metformin 5-500 Mg Tabs (Glyburide-metformin) .... 2 by mouth two times a day 2)  Low-dose Aspirin 81 Mg Tabs (Aspirin) .Marland Kitchen.. 1 by mouth once daily 3)  Tylenol Arthritis Pain 650 Mg Tbcr (Acetaminophen) .Marland Kitchen.. 1-2 by mouth three times a day as needed pain 4)  Cetirizine Hcl 10 Mg Tabs (Cetirizine hcl) .Marland Kitchen.. 1 by mouth once daily 5)  Omeprazole 20 Mg Cpdr (Omeprazole) .Marland Kitchen.. 1 by mouth once daily 6)  [employer] Pen Needles 31g X 8 Mm Misc (Insulin pen needle) .... Use daily with lantus 7)  Lisinopril-hydrochlorothiazide 20-12.5 Mg Tabs (Lisinopril-hydrochlorothiazide) .... Two tabs once daily 8)  Freestyle Lite Test Strp (Glucose blood) .... For twice daily testing 9)  Freestyle Lancets Misc (Lancets) .... Use as directed three times a day 10)  Pravastatin Sodium 40 Mg Tabs (Pravastatin sodium) .... Take 1 tab by mouth at bedtime 11)  Temazepam 15 Mg  Caps (Temazepam) .... Take 1 capsule by mouth at bedtime 12)  Lantus Solostar 100 Unit/ml Soln (Insulin glargine) .... 32 units subcutaneously at bedtime 13)  Vimovo 500-20 Mg Tbec (Naproxen-esomeprazole) .... Take 1 tablet by mouth two times a day for 2 weeks , then as needed 14)  Amlodipine Besylate 5 Mg Tabs (Amlodipine besylate) .... Take 1 tablet by mouth once a day 15)  Nabumetone 750 Mg Tabs (Nabumetone) .... Take 1 tablet by mouth two times a day 16)  Hydrocodone-acetaminophen 7.5-325 Mg Tabs (Hydrocodone-acetaminophen) .Marland Kitchen.. 1-2 tabs every 4 hrs as needed 17)  Clonidine Hcl 0.2 Mg Tabs (Clonidine hcl) .... Take 1 tab by mouth at bedtime  Patient Instructions: 1)  Please schedule a follow-up appointment in 3.5 months. 2)  Nurse blood pressure check in 6 weeks 3)  It is important that you exercise regularly at least 20 minutes 5 times a week. If you develop chest pain, have severe difficulty breathing, or feel very tired , stop exercising immediately and seek medical attention. 4)  You need to lose weight. Consider a lower calorie diet and regular exercise.  5)  You are being referred to podiatrist about your left great toenail. 6)  You need to call the orthopedic dr Aline Brochure about your knee. 7)  Your blood pressure is high, pls start the clonidine sent in tonight.Continue all other meds you are currently on. 8)  BMP prior to visit, ICD-9: 9)  HbgA1C prior to visit, ICD-9:   today. 10)  HBA1C and chem 7 in 3.5 months. Prescriptions: TEMAZEPAM 15 MG CAPS (TEMAZEPAM) Take 1 capsule by mouth at bedtime  #30 x 3   Entered by:   Baldomero Lamy LPN   Authorized by:   Tula Nakayama MD   Signed by:   Baldomero Lamy LPN on 075-GRM   Method used:   Printed then faxed to ...       [facility] (retail)       [facility] [address]       [place], [place]  [address]       Ph: [phone]       Fax: [phone]   RxID:    [id number] LISINOPRIL-HYDROCHLOROTHIAZIDE 20-12.5 MG TABS (LISINOPRIL-HYDROCHLOROTHIAZIDE) Two tabs once daily  #60 Each x 3   Entered by:   Baldomero Lamy LPN   Authorized by:   Tula Nakayama MD   Signed by:   Baldomero Lamy LPN on 075-GRM   Method used:   Electronically to        [facility] (retail)       [place] [address] [place], [place]  [address]       Ph: [phone]       Fax: [phone]   RxID:   [id number] OMEPRAZOLE 20 MG CPDR (OMEPRAZOLE) 1 by mouth once daily  #30 x 3   Entered by:   Baldomero Lamy LPN   Authorized by:   Tula Nakayama MD   Signed by:   Baldomero Lamy LPN on 075-GRM   Method used:   Electronically to        [facility] (retail)       [place] [address] [place], [place]  [address]       Ph: [phone]       Fax: [phone]   RxID:   [id number] GLYBURIDE-METFORMIN 5-500 MG  TABS (GLYBURIDE-METFORMIN) 2 by mouth two times a day  #120 Each x 3   Entered by:   Baldomero Lamy LPN   Authorized by:   Tula Nakayama MD   Signed by:   Baldomero Lamy LPN on 075-GRM   Method used:   Electronically to        [facility] (retail)       [place] [address]       [place], [place]  [address]  Ph: [phone]       Fax: [phone]   [id number] CLONIDINE HCL 0.2 MG TABS (CLONIDINE HCL) Take 1 tab by mouth at bedtime  #30 x 3   Entered and Authorized by:   Tula Nakayama MD   Signed by:   Tula Nakayama MD on 07/08/2010   Method used:   Electronically to        [facility] (retail)       [facility] [address]       [place], [place]  [address]       Ph: [phone]       Fax: [phone]   RxID:   [id number]    Medication Administration  Injection # 1:    Medication: Ketorolac-Toradol 15mg     Diagnosis: OSTEOARTHRITIS, KNEE, LEFT (ICD-715.96)    Route: IM    Site: LUOQ  gluteus    Exp Date: 10/2011    Lot #: [id number]     Mfr: [email or web address]    Comments: 60mg  given     Patient tolerated injection without complications    Given by: Kate Sable LPN (February 29, X33443 3:53 PM)  Orders Added: 1)  Est. Patient Level IV [id number] 2)  T-Basic Metabolic Panel 0000000 3)  T- Hemoglobin A1C [83036-23375] 4)  T-Basic Metabolic Panel 0000000 5)  T- Hemoglobin A1C [83036-23375] 6)  Ketorolac-Toradol 15mg  [J1885] 7)  Admin of Therapeutic Inj  intramuscular or subcutaneous [96372] 8)  Podiatry Referral [Podiatry]     Medication Administration  Injection # 1:    Medication: Ketorolac-Toradol 15mg     Diagnosis: OSTEOARTHRITIS, KNEE, LEFT (ICD-715.96)    Route: IM    Site: LUOQ gluteus    Exp Date: 10/2011    Lot #: [id number]     Mfr: [email or web address]    Comments: 60mg  given     Patient tolerated injection without complications    Given by: Kate Sable LPN (February 29, X33443 3:53 PM)  Orders Added: 1)  Est. Patient Level IV [id number] 2)  T-Basic Metabolic Panel 0000000 3)  T- Hemoglobin A1C [83036-23375] 4)  T-Basic Metabolic Panel 0000000 5)  T- Hemoglobin A1C [83036-23375] 6)  Ketorolac-Toradol 15mg  [J1885] 7)  Admin of Therapeutic Inj  intramuscular or subcutaneous [id number] 8)  Podiatry Referral [Podiatry]

## 2010-07-21 NOTE — Letter (Signed)
Summary: Out of Work  [facility]  [address] [place]  [place], [place] [address]   Phone: [phone]  Fax: [phone]    March  7, 0000000   Employee:  KASHANNA BLAKLEY [employer]    To Whom It May Concern:  The above named employee / patient was seen in our office for an appointment today.  For Medical reasons, please excuse the employee from work for the following dates:  Start:   05/29/10  End/Return to work full duty, no restrictions:   07/22/10   If you need additional information, please feel free to contact our office.         Sincerely,    Demetrius Revel, MD

## 2010-07-21 NOTE — Assessment & Plan Note (Signed)
Summary: 1 wk RE-CK KNEE/POST OP 05/29/10/[facility]   Visit Type:  Follow-up Referring Provider:  Moshe Cipro Primary Provider:  Moshe Cipro  CC:  post op knee.  History of Present Illness: I saw Kathleen [name] in the office today for a followup visit.  She is a 60 years old woman with the complaint of:  left knee pain   DOS 05-29-10.  Procedure: Arthroscopy of left knee, partial lateral meniscectomy, minimal debridement.  Medications: Lorcet Plus  Today is 1 week recheck left knee after injection for bursitits, ice, Ibuprofen 200mg , Lorcet plus and OOW .  She is somewhat better, still uses Ice, only takes Ibuprofen 200mg  at a time.  Pain level today is 3.    Work status/Note is indicating return to work tomorrow        Allergies: No Known Drug Allergies  Physical Exam  Additional Exam:   LEFT knee tendinitis and bursitis, has improved with decreased tenderness. Her range of motion is normal. She has no joint effusion. She still has a lot of soreness around the knee joint.     Impression & Recommendations:  Problem # 1:  PES ANSERINUS TENDINITIS OR BURSITIS (ICD-726.61)  Orders: Post-Op Check [id number])  Problem # 2:  OSTEOARTHRITIS, KNEE, LEFT (ICD-715.96)  Her updated medication list for this problem includes:    Low-dose Aspirin 81 Mg Tabs (Aspirin) .Marland Kitchen... 1 by mouth once daily    Tylenol Arthritis Pain 650 Mg Tbcr (Acetaminophen) .Marland Kitchen... 1-2 by mouth three times a day as needed pain    Vimovo 500-20 Mg Tbec (Naproxen-esomeprazole) .Marland Kitchen... Take 1 tablet by mouth two times a day for 2 weeks , then as needed    Nabumetone 750 Mg Tabs (Nabumetone) .Marland Kitchen... Take 1 tablet by mouth two times a day    Hydrocodone-acetaminophen 7.5-325 Mg Tabs (Hydrocodone-acetaminophen) .Marland Kitchen... 1-2 tabs every 4 hrs as needed  Orders: Post-Op Check [id number])  Problem # 3:  ARTHROSCOPY, LEFT KNEE, HX OF (ICD-V45.89)  Orders: Post-Op Check [id number])  Problem # 4:  LATERAL MENISCUS TEAR, LEFT  (ICD-836.1)  Orders: Post-Op Check [id number])  Patient Instructions: 1)  change  2)  stay OOW x 1 week  3)  f/u  4)  Please schedule a follow-up appointment in 3 months.   Orders Added: 1)  Post-Op Check [id number]

## 2010-07-25 LAB — GLUCOSE, CAPILLARY: Glucose-Capillary: 166 mg/dL — ABNORMAL HIGH (ref 70–99)

## 2010-07-27 ENCOUNTER — Encounter: Payer: Self-pay | Admitting: Orthopedic Surgery

## 2010-07-27 ENCOUNTER — Ambulatory Visit (INDEPENDENT_AMBULATORY_CARE_PROVIDER_SITE_OTHER): Payer: Managed Care, Other (non HMO) | Admitting: Orthopedic Surgery

## 2010-07-27 DIAGNOSIS — M1710 Unilateral primary osteoarthritis, unspecified knee: Secondary | ICD-10-CM

## 2010-07-27 DIAGNOSIS — Z9889 Other specified postprocedural states: Secondary | ICD-10-CM

## 2010-07-27 DIAGNOSIS — IMO0002 Reserved for concepts with insufficient information to code with codable children: Secondary | ICD-10-CM

## 2010-07-28 NOTE — Miscellaneous (Signed)
  Clinical Lists Changes  Medications: Removed medication of LANTUS SOLOSTAR 100 UNIT/ML SOLN (INSULIN GLARGINE) 32 units subcutaneously at bedtime - Signed Added new medication of LANTUS SOLOSTAR 100 UNIT/ML SOLN (INSULIN GLARGINE) 60 units once daily, dose increase effective 07/14/2010, pt to satrt at 40 units and titrate as needed - Signed Rx of LANTUS SOLOSTAR 100 UNIT/ML SOLN (INSULIN GLARGINE) 60 units once daily, dose increase effective 07/14/2010, pt to satrt at 40 units and titrate as needed;  #1800 units x 4;  Signed;  Entered by: Tula Nakayama MD;  Authorized by: Tula Nakayama MD;  Method used: Historical Rx of LANTUS SOLOSTAR 100 UNIT/ML SOLN (INSULIN GLARGINE) 60 units once daily, dose increase effective 07/14/2010, pt to satrt at 40 units and titrate as needed;  #1800 units x 4;  Signed;  Entered by: Baldomero Lamy LPN;  Authorized by: Tula Nakayama MD;  Method used: Electronically to [facility] [place], [place], [place], [place]  [address], Ph: [phone], Fax: [phone]    Prescriptions: LANTUS SOLOSTAR 100 UNIT/ML SOLN (INSULIN GLARGINE) 60 units once daily, dose increase effective 07/14/2010, pt to satrt at 40 units and titrate as needed  #1800 units x 4   Entered by:   Baldomero Lamy LPN   Authorized by:   Tula Nakayama MD   Signed by:   Baldomero Lamy LPN on [id number]   Method used:   Electronically to        [facility] (retail)       [place] [address]       [place], [place]  [address]       Ph: [phone]       Fax: [phone]   RxID:   [id number] LANTUS SOLOSTAR 100 UNIT/ML SOLN (INSULIN GLARGINE) 60 units once daily, dose increase effective 07/14/2010, pt to satrt at 40 units and titrate as needed  #1800 units x 4   Entered and Authorized by:   Tula Nakayama MD   Signed by:   Tula Nakayama MD on 07/13/2010   Method used:   Historical   [id number]  do you still wish to  make this change? patients meter was checked in office yes her blood sugar is uncontrolled hBA1c was 8, up from 7, encourageher to chck at least twice daily before breakfast and 2 hours after any meal or at bedtime so we can better understand when it is too high  med sent and patient aware

## 2010-08-06 NOTE — Assessment & Plan Note (Signed)
Summary: left knee very swollen,painful/post op 05/29/10/[employer]   Visit Type:  post op Referring Provider:  Moshe Cipro Primary Provider:  Moshe Cipro  CC:  left knee .  History of Present Illness: 60-year-old female, status post arthroscopy, LEFT knee, partial lateral meniscectomy, with minimal debridement.  This is postoperative week #8. She continued to wear her LEFT knee with swelling and inability to stand for 8 hours at her job. His ulnar suppressant ibuprofen 800. I aspirated the knee and injected it on March 1 and she said that did help, but when she went back to. He started having pain and swelling again.      Allergies: No Known Drug Allergies  Physical Exam  Additional Exam:   LEFT knee. She has a small joint effusion. No real joint line tenderness. Her knee flexion is approximately 110 with full knee extension, maybe 5 shy.  Her knee remained stable.     Impression & Recommendations:  Problem # 1:  ARTHROSCOPY, LEFT KNEE, HX OF (ICD-V45.89)  Orders: Post-Op Check [id number])  Problem # 2:  OSTEOARTHRITIS, KNEE, LEFT (ICD-715.96)     Orders: Post-Op Check [id number])  Patient Instructions: 1)  Please schedule a follow-up appointment in 1 month. 2)  OOW x 1 month and 1 day    Orders Added: 1)  Post-Op Check [id number]

## 2010-08-06 NOTE — Letter (Signed)
Summary: Out of Work  [facility]  [address] [place]  [place], [place] [address]   Phone: [phone]  Fax: [phone]    March 19, 0000000   Employee:  SHAHINA CAVALLO [facility] [employer]    To Whom It May Concern:   For Medical reasons, please excuse the above named employee from work for the following dates:  Start:   07/27/10  End:   09/01/10  Estimated return to work date:  09/02/10  If you need additional information, please feel free to contact our office.         Sincerely,    Demetrius Revel, MD

## 2010-09-01 ENCOUNTER — Encounter: Payer: Self-pay | Admitting: Orthopedic Surgery

## 2010-09-01 ENCOUNTER — Ambulatory Visit (INDEPENDENT_AMBULATORY_CARE_PROVIDER_SITE_OTHER): Payer: Managed Care, Other (non HMO) | Admitting: Orthopedic Surgery

## 2010-09-01 DIAGNOSIS — M1710 Unilateral primary osteoarthritis, unspecified knee: Secondary | ICD-10-CM

## 2010-09-01 DIAGNOSIS — IMO0002 Reserved for concepts with insufficient information to code with codable children: Secondary | ICD-10-CM

## 2010-09-01 MED ORDER — HYDROCODONE-ACETAMINOPHEN 7.5-325 MG PO TABS: 1.0000 | ORAL_TABLET | Freq: Four times a day (QID) | ORAL | Status: AC | PRN

## 2010-09-01 NOTE — Progress Notes (Signed)
Kathleen [name] is having continued trouble with her LEFT knee.  On MRI and an arthroscopy we saw 3 compartment arthritis and a partial lateral meniscectomy and debridement of the chondral flap tears that she had medially and laterally.  Since her surgery after a period of aqueous sense of her symptoms she has had continued pain and swelling exacerbated by standing which is required at her job.  I indicated to her that she will probably have to have a knee replacement as nothing is really helping her at this point  However she's been out of work so long as she wants to give it one more try to try to get enough money to catch up on her bills  She will try working and then call us if she can't work and if she wants to have surgery.  I gave her an knee replacement booklet to help start the process a patient education for knee replacement

## 2010-10-02 ENCOUNTER — Telehealth: Payer: Self-pay | Admitting: Orthopedic Surgery

## 2010-10-02 NOTE — Telephone Encounter (Signed)
Patient is still having the same knee pain and notes "cracking" and increased pain when on her feet at work.  She had an "old appointment" scheduled for 10/15/10, and I advised that Dr.was in surgery this day. She has been taking medication and reviewing information given on 09/01/10, last visit, re: surgery.  States she checked with employer and may not be able to get leave from work until "next year."  Asking if any recommendations and/or when she can be seen back in office if she can't schedule surgery just yet. Patient ph#[phone] .

## 2010-10-06 NOTE — Telephone Encounter (Signed)
Sorry   i dont understand   What is the question???

## 2010-10-07 NOTE — Telephone Encounter (Signed)
The question is, if she cannot schedule surgery, what other option(s) does she have?

## 2010-10-08 NOTE — Telephone Encounter (Signed)
Continue as she is doing or surgery

## 2010-10-08 NOTE — Telephone Encounter (Signed)
Called patient and advised per Dr. Ruthe Mannan response.  She is looking to have surgery by September when she can schedule the time off from work.  Scheduled for follow up appointment in August to discuss.

## 2010-10-15 ENCOUNTER — Ambulatory Visit: Payer: Managed Care, Other (non HMO) | Admitting: Orthopedic Surgery

## 2010-10-27 ENCOUNTER — Encounter: Payer: Self-pay | Admitting: Family Medicine

## 2010-10-28 ENCOUNTER — Ambulatory Visit (INDEPENDENT_AMBULATORY_CARE_PROVIDER_SITE_OTHER): Payer: 59 | Admitting: Family Medicine

## 2010-10-28 ENCOUNTER — Encounter: Payer: Self-pay | Admitting: Family Medicine

## 2010-10-28 VITALS — BP 140/90 | HR 84 | Resp 16 | Ht 67.0 in | Wt 235.4 lb

## 2010-10-28 DIAGNOSIS — Z2911 Encounter for prophylactic immunotherapy for respiratory syncytial virus (RSV): Secondary | ICD-10-CM

## 2010-10-28 DIAGNOSIS — M1710 Unilateral primary osteoarthritis, unspecified knee: Secondary | ICD-10-CM

## 2010-10-28 DIAGNOSIS — IMO0001 Reserved for inherently not codable concepts without codable children: Secondary | ICD-10-CM

## 2010-10-28 DIAGNOSIS — IMO0002 Reserved for concepts with insufficient information to code with codable children: Secondary | ICD-10-CM

## 2010-10-28 DIAGNOSIS — I10 Essential (primary) hypertension: Secondary | ICD-10-CM

## 2010-10-28 DIAGNOSIS — Z23 Encounter for immunization: Secondary | ICD-10-CM

## 2010-10-28 DIAGNOSIS — E669 Obesity, unspecified: Secondary | ICD-10-CM

## 2010-10-28 DIAGNOSIS — E785 Hyperlipidemia, unspecified: Secondary | ICD-10-CM

## 2010-10-28 LAB — HEMOGLOBIN A1C
Hgb A1c MFr Bld: 7.7 % — ABNORMAL HIGH (ref <5.7)
Mean Plasma Glucose: 174 mg/dL — ABNORMAL HIGH (ref <117)

## 2010-10-28 LAB — LIPID PANEL
Cholesterol: 201 mg/dL — ABNORMAL HIGH (ref 0–200)
LDL Cholesterol: 129 mg/dL — ABNORMAL HIGH (ref 0–99)
Total CHOL/HDL Ratio: 4.5 Ratio
VLDL: 27 mg/dL (ref 0–40)

## 2010-10-28 MED ORDER — AMLODIPINE BESYLATE 5 MG PO TABS: 5.0000 mg | ORAL_TABLET | Freq: Every day | ORAL | Status: DC

## 2010-10-28 NOTE — Assessment & Plan Note (Signed)
Uncontrolled, pt has been non compliant with amlodipine, same has been sent in and I have explained that she needs to take this med also

## 2010-10-28 NOTE — Assessment & Plan Note (Signed)
Low fat diet discussed, fasting labs today

## 2010-10-28 NOTE — Progress Notes (Signed)
  Subjective:    Patient ID: Kathleen [name], female    DOB: 1950/11/07, 60 y.o.   MRN: [id number]  HPI Left knee pain worsening has rept surgery in sept dr Aline Brochure HYPERTENSION Disease Monitoring Blood pressure range-unknown Chest pain- no      Dyspnea- no Medications Compliance- poor , not taking amlodipine Lightheadedness- no   Edema- no   DIABETES Disease Monitoring Blood Sugar ranges-not testing Polyuria- no New Visual problems- no Medications Compliance- fair Hypoglycemic symptoms- no   HYPERLIPIDEMIA Disease Monitoring See symptoms for Hypertension Medications Compliance- fair RUQ pain- no  Muscle aches- nio eye exam earlier this year   Review of Systems Denies recent fever or chills. Denies sinus pressure, nasal congestion, ear pain or sore throat. Denies chest congestion, productive cough or wheezing. Denies chest pains, palpitations, paroxysmal nocturnal dyspnea, orthopnea and leg swelling Denies abdominal pain, nausea, vomiting,diarrhea or constipation.  Denies rectal bleeding or change in bowel movement. Denies dysuria, frequency, hesitancy or incontinence. Denies headaches, seizure, numbness, or tingling. Depressed about uncontrolled knee pain, has uncontrolled knee pain which is debilitating Denies skin break down or rash.        Objective:   Physical Exam Patient alert and oriented and in no Cardiopulmonary distress.  HEENT: No facial asymmetry, EOMI, no sinus tenderness, TM's clear, Oropharynx pink and moist.  Neck supple no adenopathy.  Chest: Clear to auscultation bilaterally.  CVS: S1, S2 no murmurs, no S3.  ABD: Soft non tender. Bowel sounds normal.  Ext: No edema  MS: Adequate ROM spine, shoulders, hips and reduced in left  knee.  Skin: Intact, no ulcerations or rash noted.  Psych: Good eye contact, normal affect. Memory intact not anxious or depressed appearing.  CNS: CN 2-12 intact, power, tone and sensation normal throughout.       Assessment & Plan:

## 2010-10-28 NOTE — Patient Instructions (Addendum)
cpe in 3 to 4  months.   Labs today , fastiong    Non fasting HBA1C and chem 7 in 4 months.  Zostavax today  BP is high, you need to take amlodipine 5mg  one daily, this has been sent to your pharmacy

## 2010-10-28 NOTE — Assessment & Plan Note (Signed)
Check hBA1C to determine control, pt encouraged to test blood sugars regulalrly

## 2010-10-28 NOTE — Assessment & Plan Note (Signed)
Unchanged, lifestyle change encouraged and discussed to facilitate weight loss

## 2010-10-28 NOTE — Assessment & Plan Note (Signed)
Deteriorated, has upcoming knee surgery

## 2010-10-29 LAB — COMPLETE METABOLIC PANEL WITH GFR
ALT: 13 U/L (ref 0–35)
Albumin: 4.1 g/dL (ref 3.5–5.2)
BUN: 14 mg/dL (ref 6–23)
GFR, Est Non African American: 58 mL/min — ABNORMAL LOW (ref >60)
Glucose, Bld: 128 mg/dL — ABNORMAL HIGH (ref 70–99)
Potassium: 4 mEq/L (ref 3.5–5.3)
Sodium: 139 mEq/L (ref 135–145)
Total Protein: 7.5 g/dL (ref 6.0–8.3)

## 2010-11-10 MED ORDER — INSULIN GLARGINE 100 UNIT/ML ~~LOC~~ SOLN: 40.0000 [IU] | Freq: Every day | SUBCUTANEOUS | Status: DC

## 2010-11-10 MED ORDER — PRAVASTATIN SODIUM 80 MG PO TABS: 80.0000 mg | ORAL_TABLET | Freq: Every evening | ORAL | Status: DC

## 2010-11-10 NOTE — Progress Notes (Signed)
Addended by: Baldomero Lamy B on: 11/10/2010 10:21 AM   Modules accepted: Orders

## 2010-12-15 ENCOUNTER — Ambulatory Visit: Payer: 59 | Admitting: Orthopedic Surgery

## 2010-12-30 ENCOUNTER — Ambulatory Visit (INDEPENDENT_AMBULATORY_CARE_PROVIDER_SITE_OTHER): Payer: 59 | Admitting: Orthopedic Surgery

## 2010-12-30 ENCOUNTER — Encounter: Payer: Self-pay | Admitting: Orthopedic Surgery

## 2010-12-30 DIAGNOSIS — M1710 Unilateral primary osteoarthritis, unspecified knee: Secondary | ICD-10-CM

## 2010-12-30 DIAGNOSIS — IMO0002 Reserved for concepts with insufficient information to code with codable children: Secondary | ICD-10-CM

## 2010-12-30 NOTE — Patient Instructions (Addendum)
Take pain medication and Ibuprofen as needed  Wear Brace   Surgery is on October 1st  I will call you and let you know when to go for preop at [facility]  Stop taking Aspirin, Ibuprofen, Plavix or other blood thinners on September 24th  Post op 1 will be on October 15th

## 2010-12-30 NOTE — Progress Notes (Signed)
Kathleen [name] is having continued trouble with her LEFT knee. On MRI and at arthroscopy we saw 3 compartment arthritis, had a partial lateral meniscectomy and debridement of the chondral flap tears that she had medially and laterally. Since her surgery after a period of aquessense of her symptoms,  she has had continued pain and swelling exacerbated by standing which is required at her job.   I indicated to her that she will probably have to have a knee replacement as nothing is really helping her at this point   However she's been out of work so long as she wants to give it one more try to try to get enough money to catch up on her bills   She will try working and then call us if she can't work and if she wants to have surgery. I gave her an knee replacement booklet to help start the process a patient education for knee replacement  Today she is complaining of Continued knee pain especially when the cold air hitting her knee.  She has episodes of difficulty walking difficulty bending her knee swelling.  She has pain which is partially relieved by hydrocodone and ibuprofen  She wishes to proceed with knee replacement surgery  She has read the [employer] handout.        3 views LEFT knee  X-ray show valgus malalignment to the knee with narrowing of the lateral compartment and marginal spur on the femur.  Impression osteoarthritis LEFT knee

## 2010-12-30 NOTE — H&P (Signed)
Kathleen [name] is an 60 y.o. female.   Chief Complaint: LEFT knee pain   HPI: The patient has had a knee arthroscopy she had a lateral meniscectomy she had medial and lateral chondral flap tears which were debrided.  After surgery she continued to have knee effusion requiring aspiration and injection.  She was continued with hydrocodone and ibuprofen her pain has persisted.  She has difficulty ambulating, bending her knee and standing.  She tried to go back to work he struggled and has to come to disabling knee pain.  She would like to proceed with LEFT knee replacement surgery.  She is ready [employer] handout and we have discussed the surgery and its risk and benefit ratio including nonoperative treatment.  She has agreed to proceed with a LEFT knee replacement.  Past Medical History  Diagnosis Date  . Diabetes mellitus type II   . GERD (gastroesophageal reflux disease)   . Hypertension 1995  . Low back pain   . Hyperlipidemia 2000  . Obesity   . Insomnia   . Dysphagia     unspecified   . Anemia     Past Surgical History  Procedure Date  . Cholecystectomy 2007  . Tubal ligation   . Tenotomy     2,3,4  left foot   . Carpal tunnel release     left   . Knee surgery 1.20.2012    arthroscopy LEFT knee partial medial meniscectomy    Family History  Problem Relation Age of Onset  . Diabetes Father   . Diabetes Mother   . Hypertension Mother     MI  . Diabetes Sister   . Diabetes Brother   . Diabetes Brother    Social History:  reports that she has never smoked. She does not have any smokeless tobacco history on file. She reports that she does not drink alcohol or use illicit drugs.  Allergies: No Known Allergies  Medications Prior to Admission  Medication Sig Dispense Refill  . amLODipine (NORVASC) 5 MG tablet Take 1 tablet (5 mg total) by mouth daily.  30 tablet  3  . aspirin (ASPIRIN LOW DOSE) 81 MG tablet Take 81 mg by mouth daily.        . Cetirizine HCl 10 MG CAPS Take  by mouth daily.        . cloNIDine (CATAPRES) 0.2 MG tablet Take 0.2 mg by mouth at bedtime. Take one tab by mouth at bedtime       . gabapentin (NEURONTIN) 300 MG capsule Take 300 mg by mouth as directed. 1 by mouth qam and 2 by mouth at bedtime       . glucose blood (FREESTYLE LITE) test strip 1 each by Other route 2 (two) times daily. Testing twice daily       . glyBURIDE-metformin (GLUCOVANCE) 5-500 MG per tablet Take 1 tablet by mouth 2 (two) times daily. 2 by mouth two times a day       . HYDROcodone-acetaminophen (NORCO) 7.5-325 MG per tablet Take 1 tablet by mouth every 6 (six) hours as needed.        . insulin glargine (LANTUS SOLOSTAR) 100 UNIT/ML injection Inject 40 Units into the skin at bedtime. DOSE INCREASE  3 mL  5  . Insulin Pen Needle 31G X 8 MM MISC by Does not apply route. Use daily with lancets         . Lancets (FREESTYLE) lancets 1 each by Other route 3 (three) times daily. Use  as directed three times a day       . lisinopril-hydrochlorothiazide (PRINZIDE,ZESTORETIC) 20-12.5 MG per tablet Take 2 tablets by mouth daily.        . Omeprazole 20 MG TBEC Take by mouth daily.        . pravastatin (PRAVACHOL) 80 MG tablet Take 1 tablet (80 mg total) by mouth every evening. DOSE INCREASE  30 tablet  5  . sitaGLIPtan (JANUVIA) 100 MG tablet Take 100 mg by mouth daily.        . temazepam (RESTORIL) 15 MG capsule Take 15 mg by mouth at bedtime.        Marland Kitchen acetaminophen (TYLENOL ARTHRITIS PAIN) 650 MG CR tablet Take 650 mg by mouth 3 (three) times daily as needed. 1-2 by mouth three times a day as needed pain        No current facility-administered medications on file as of 12/30/2010.    No results found for this or any previous visit (from the past 48 hour(s)). @[id number] @  Review of Systems  Constitutional: Negative.   HENT: Negative.   Eyes: Negative.   Respiratory: Negative.   Cardiovascular: Negative.   Gastrointestinal: Negative.   Genitourinary: Negative.     Musculoskeletal: Positive for joint pain.  Skin: Negative.   Neurological: Negative.   Endo/Heme/Allergies: Negative.   Psychiatric/Behavioral: Negative.     There were no vitals taken for this visit. Physical Exam  Constitutional: She appears well-developed and well-nourished.  HENT:  Head: Normocephalic and atraumatic.  Eyes: Conjunctivae and EOM are normal. Pupils are equal, round, and reactive to light.  Neck: Trachea normal, normal range of motion and full passive range of motion without pain. Neck supple.  Cardiovascular: Normal rate and regular rhythm.   Respiratory: Effort normal.  GI: Soft. Normal appearance.  Musculoskeletal:       Right shoulder: Normal.       Left shoulder: Normal.       Right elbow: Normal.      Left elbow: Normal.       Right wrist: Normal.       Left wrist: Normal.       Right hip: Normal.       Left hip: Normal.       Right knee: Normal.       Left knee: She exhibits decreased range of motion, swelling, deformity, abnormal alignment and bony tenderness. She exhibits no LCL laxity, normal patellar mobility and no MCL laxity. tenderness found. Lateral joint line tenderness noted. No medial joint line, no MCL, no LCL and no patellar tendon tenderness noted.       Right ankle: Normal.  Lymphadenopathy:       Right cervical: No superficial cervical adenopathy present.      Left cervical: No superficial cervical adenopathy present.       Right: No inguinal adenopathy present.       Left: No inguinal adenopathy present.  Neurological: She is alert. Coordination normal.  Skin: Skin is warm, dry and intact.  Psychiatric: She has a normal mood and affect. Her speech is normal and behavior is normal. Judgment and thought content normal. Cognition and memory are normal.     Assessment/Plan  X-rays were obtained.  There is mild valgus malalignment to the knee.  Lateral compartment is narrower than the medial compartment with a femoral marginal  spur.  Impression osteoarthritis LEFT knee primarily valgus arthritis   Osteoarthritis LEFT knee   LEFT total knee replacement with Depuy implant  Arther Abbott 12/30/2010, 10:48 AM

## 2010-12-31 ENCOUNTER — Telehealth: Payer: Self-pay | Admitting: Orthopedic Surgery

## 2010-12-31 NOTE — Telephone Encounter (Signed)
Cancelled surgery with Horris Latino at [facility] short stay, called Suezanne Jacquet with [employer] also.

## 2010-12-31 NOTE — Telephone Encounter (Signed)
Kathleen [name] wants to cancel her surgery scheduled for October.  Checked with her employer and her insurance will not pay for another surgery until around  January 20,2013.  Said she will call back to schedule appointment  In January.

## 2011-01-18 ENCOUNTER — Other Ambulatory Visit: Payer: Self-pay | Admitting: Family Medicine

## 2011-01-28 ENCOUNTER — Other Ambulatory Visit: Payer: Self-pay | Admitting: Family Medicine

## 2011-02-01 ENCOUNTER — Other Ambulatory Visit (HOSPITAL_COMMUNITY): Payer: 59

## 2011-02-07 ENCOUNTER — Other Ambulatory Visit: Payer: Self-pay | Admitting: *Deleted

## 2011-02-08 ENCOUNTER — Inpatient Hospital Stay: Admit: 2011-02-08 | Payer: Self-pay | Admitting: Orthopedic Surgery

## 2011-02-08 SURGERY — ARTHROPLASTY, KNEE, TOTAL
Anesthesia: Choice | Laterality: Left

## 2011-02-19 ENCOUNTER — Emergency Department (HOSPITAL_COMMUNITY)
Admission: EM | Admit: 2011-02-19 | Discharge: 2011-02-19 | Disposition: A | Discharge status: Home / Self Care | Payer: 59 | Attending: Emergency Medicine | Admitting: Emergency Medicine

## 2011-02-19 ENCOUNTER — Emergency Department (HOSPITAL_COMMUNITY): Payer: 59

## 2011-02-19 ENCOUNTER — Encounter (HOSPITAL_COMMUNITY): Payer: Self-pay | Admitting: *Deleted

## 2011-02-19 DIAGNOSIS — E119 Type 2 diabetes mellitus without complications: Secondary | ICD-10-CM | POA: Insufficient documentation

## 2011-02-19 DIAGNOSIS — S91309A Unspecified open wound, unspecified foot, initial encounter: Secondary | ICD-10-CM | POA: Insufficient documentation

## 2011-02-19 DIAGNOSIS — Y92009 Unspecified place in unspecified non-institutional (private) residence as the place of occurrence of the external cause: Secondary | ICD-10-CM | POA: Insufficient documentation

## 2011-02-19 DIAGNOSIS — E785 Hyperlipidemia, unspecified: Secondary | ICD-10-CM | POA: Insufficient documentation

## 2011-02-19 DIAGNOSIS — W010XXA Fall on same level from slipping, tripping and stumbling without subsequent striking against object, initial encounter: Secondary | ICD-10-CM | POA: Insufficient documentation

## 2011-02-19 DIAGNOSIS — S91319A Laceration without foreign body, unspecified foot, initial encounter: Secondary | ICD-10-CM

## 2011-02-19 DIAGNOSIS — K219 Gastro-esophageal reflux disease without esophagitis: Secondary | ICD-10-CM | POA: Insufficient documentation

## 2011-02-19 DIAGNOSIS — Z794 Long term (current) use of insulin: Secondary | ICD-10-CM | POA: Insufficient documentation

## 2011-02-19 DIAGNOSIS — Z862 Personal history of diseases of the blood and blood-forming organs and certain disorders involving the immune mechanism: Secondary | ICD-10-CM | POA: Insufficient documentation

## 2011-02-19 MED ORDER — LIDOCAINE HCL (PF) 1 % IJ SOLN
INTRAMUSCULAR | Status: AC
  Administered 2011-02-19: 13:00:00
  Filled 2011-02-19: qty 5

## 2011-02-19 NOTE — ED Provider Notes (Signed)
History   This chart was scribed for Kathleen Blade, MD by Carolyne Littles. The patient was seen in room APA05/APA05 and the patient's care was started at 10:10AM.   CSN: [id number] Arrival date & time: 02/19/2011  9:47 AM  Chief Complaint  Patient presents with  . Laceration    left pinky toe    (Consider location/radiation/quality/duration/timing/severity/associated sxs/prior treatment) HPI SAPPHYRE [name] is a 60 y.o. female who presents to the Emergency Department complaining of moderate laceration at flexion crease of left 5th toe sustained this morning after slipping on a piece of paper while walking and falling backwards. Denies head injury, HA, nausea, vomiting. Patient reports she is currently experiencing mild cough with associated rhinorrhea as well. Patient with h/o diabetes, hypertension, hyperlipidemia, anemia.  Past Medical History  Diagnosis Date  . Diabetes mellitus type II   . GERD (gastroesophageal reflux disease)   . Hypertension 1995  . Low back pain   . Hyperlipidemia 2000  . Obesity   . Insomnia   . Dysphagia     unspecified   . Anemia     Past Surgical History  Procedure Date  . Cholecystectomy 2007  . Tubal ligation   . Tenotomy     2,3,4  left foot   . Carpal tunnel release     left   . Knee surgery 1.20.2012    arthroscopy LEFT knee partial medial meniscectomy    Family History  Problem Relation Age of Onset  . Diabetes Father   . Diabetes Mother   . Hypertension Mother     MI  . Diabetes Sister   . Diabetes Brother   . Diabetes Brother     History  Substance Use Topics  . Smoking status: Never Smoker   . Smokeless tobacco: Not on file  . Alcohol Use: No    OB History    Grav Para Term Preterm Abortions TAB SAB Ect Mult Living                  Review of Systems 10 Systems reviewed and are negative for acute change except as noted in the HPI.  Allergies  Review of patient's allergies indicates no known allergies.  Home  Medications   Current Outpatient Rx  Name Route Sig Dispense Refill  . ACETAMINOPHEN ER 650 MG PO TBCR Oral Take 650 mg by mouth 3 (three) times daily as needed. 1-2 by mouth three times a day as needed pain     . AMLODIPINE BESYLATE 5 MG PO TABS Oral Take 1 tablet (5 mg total) by mouth daily. 30 tablet 3  . ASPIRIN 81 MG PO TABS Oral Take 81 mg by mouth daily.      Marland Kitchen CETIRIZINE HCL 10 MG PO CAPS Oral Take by mouth daily.      Marland Kitchen CLONIDINE HCL 0.2 MG PO TABS Oral Take 0.2 mg by mouth at bedtime. Take one tab by mouth at bedtime     . GABAPENTIN 300 MG PO CAPS Oral Take 300 mg by mouth as directed. 1 by mouth qam and 2 by mouth at bedtime     . GLUCOSE BLOOD VI STRP Other 1 each by Other route 2 (two) times daily. Testing twice daily     . GLYBURIDE-METFORMIN 5-500 MG PO TABS Oral Take 1 tablet by mouth 2 (two) times daily. 2 by mouth two times a day     . HYDROCODONE-ACETAMINOPHEN 7.5-325 MG PO TABS Oral Take 1 tablet by mouth every  6 (six) hours as needed.      . IBUPROFEN 800 MG PO TABS Oral Take 800 mg by mouth every 8 (eight) hours as needed.      . INSULIN GLARGINE 100 UNIT/ML [place] SOLN Subcutaneous Inject 40 Units into the skin at bedtime. DOSE INCREASE 3 mL 5  . INSULIN PEN NEEDLE 31G X 8 MM MISC Does not apply by Does not apply route. Use daily with lancets       . FREESTYLE LANCETS MISC Other 1 each by Other route 3 (three) times daily. Use as directed three times a day     . OMEPRAZOLE 20 MG PO TBEC Oral Take by mouth daily.      Marland Kitchen PRAVASTATIN SODIUM 80 MG PO TABS Oral Take 1 tablet (80 mg total) by mouth every evening. DOSE INCREASE 30 tablet 5  . PRINZIDE 20-12.5 MG PO TABS  TAKE 2 TABLETS BY MOUTH  DAILY FOR BLOOD PRESSURE. 60 each 3  . RESTORIL 15 MG PO CAPS  TAKE (1) CAPSULE BY MOUTHAT BEDTIME. 30 each 2  . SITAGLIPTIN PHOSPHATE 100 MG PO TABS Oral Take 100 mg by mouth daily.        BP 173/79  Pulse 82  Temp(Src) 98.4 F (36.9 C) (Oral)  Resp 18  Ht 5\' 6"  (1.676 m)  Wt 230  lb (104.327 kg)  BMI 37.12 kg/m2  SpO2 96%  Physical Exam  Nursing note and vitals reviewed. Constitutional: She is oriented to person, place, and time. She appears well-developed and well-nourished. No distress.  HENT:  Head: Normocephalic and atraumatic.  Eyes: EOM are normal. Pupils are equal, round, and reactive to light.  Neck: Neck supple. No tracheal deviation present.  Cardiovascular: Normal rate.   Pulmonary/Chest: Effort normal. No respiratory distress.  Abdominal: She exhibits no distension.  Musculoskeletal: Normal range of motion. She exhibits no edema.       Left 5th toe functionally and neurovascularly intact.   Neurological: She is alert and oriented to person, place, and time. No sensory deficit.  Skin: Skin is warm and dry.       2.5cm full thickness laceration at flexion crease of left 5th toe.  Psychiatric: She has a normal mood and affect. Her behavior is normal.    ED Course  Procedures (including critical care time)  LACERATION REPAIR PROCEDURE NOTE The patient's identification was confirmed and consent was obtained. This procedure was performed by Kathleen Blade, MD at 12:33 PM. Site: Flexion crease of left 5th toe Anesthetic used (type and amt): 1% Xylocaine without epineprine Suture type/size: 3-0 Prolene Length:2.5cm # of Sutures: 3 Technique:Simple Interrupted Complexity: Complex/Complicated Antibx ointment applied Tetanus UTD or ordered Site anesthetized, irrigated with NS, explored without evidence of foreign body, wound well approximated, site covered with dry, sterile dressing.  Patient tolerated procedure well without complications. Instructions for care discussed verbally and patient provided with additional written instructions for homecare and f/u.  DIAGNOSTIC STUDIES: Oxygen Saturation is 96% on room air, normal by my interpretation.    COORDINATION OF CARE: 12:33PM- Physician to patient bedside to perform laceration repair.     Labs Reviewed  GLUCOSE, CAPILLARY - Abnormal; Notable for the following:    Glucose-Capillary 149 (*)    All other components within normal limits   Dg Toe 5th Left  02/19/2011  *RADIOLOGY REPORT*  Clinical Data: Laceration, the patient is diabetic  LEFT TOE - 2+ VIEW  Comparison: None.  Findings: No acute fracture is seen.  Alignment  is normal.  No focal demineralization is seen.  IMPRESSION: Negative left fifth toe.  Original Report Authenticated By: Joretta Bachelor, M.D.     No diagnosis found.    MDM  Toe Lac. Without fracture      I personally performed the services described in this documentation, which was scribed in my presence. The recorded information has been reviewed and considered.     Kathleen Blade, MD 02/19/11 1725

## 2011-02-19 NOTE — ED Notes (Signed)
Family at bedside. Patient was not in the room at this time. Family states they do not need anything at this time.

## 2011-02-19 NOTE — ED Notes (Signed)
Lt foot cleaned and dressing applied.

## 2011-02-19 NOTE — ED Notes (Signed)
Pt c/o laceration to left pinky toe; pt states she had got up to go to bathroom this am and on the way back to bed she slipped and hit toe against door; pt denies any pain and bleeding is now controlled at this time

## 2011-02-19 NOTE — ED Notes (Signed)
Dr Eulis Foster at bedside, sutured left foot laceration.  Pt tolerated well. Bleeding under control.

## 2011-02-22 ENCOUNTER — Ambulatory Visit: Payer: 59 | Admitting: Orthopedic Surgery

## 2011-02-22 ENCOUNTER — Encounter: Payer: Self-pay | Admitting: Family Medicine

## 2011-02-23 ENCOUNTER — Ambulatory Visit (INDEPENDENT_AMBULATORY_CARE_PROVIDER_SITE_OTHER): Payer: 59 | Admitting: Family Medicine

## 2011-02-23 ENCOUNTER — Encounter: Payer: Self-pay | Admitting: Family Medicine

## 2011-02-23 VITALS — BP 130/84 | HR 96 | Resp 16 | Ht 67.0 in | Wt 228.0 lb

## 2011-02-23 DIAGNOSIS — S91109A Unspecified open wound of unspecified toe(s) without damage to nail, initial encounter: Secondary | ICD-10-CM

## 2011-02-23 DIAGNOSIS — E119 Type 2 diabetes mellitus without complications: Secondary | ICD-10-CM

## 2011-02-23 DIAGNOSIS — Z23 Encounter for immunization: Secondary | ICD-10-CM

## 2011-02-23 DIAGNOSIS — E785 Hyperlipidemia, unspecified: Secondary | ICD-10-CM

## 2011-02-23 DIAGNOSIS — I10 Essential (primary) hypertension: Secondary | ICD-10-CM

## 2011-02-23 DIAGNOSIS — IMO0001 Reserved for inherently not codable concepts without codable children: Secondary | ICD-10-CM

## 2011-02-23 DIAGNOSIS — S91115A Laceration without foreign body of left lesser toe(s) without damage to nail, initial encounter: Secondary | ICD-10-CM | POA: Insufficient documentation

## 2011-02-23 MED ORDER — CLONIDINE HCL 0.2 MG PO TABS: 0.2000 mg | ORAL_TABLET | Freq: Every day | ORAL | Status: DC

## 2011-02-23 MED ORDER — CEPHALEXIN 500 MG PO CAPS: 500.0000 mg | ORAL_CAPSULE | Freq: Three times a day (TID) | ORAL | Status: AC

## 2011-02-23 MED ORDER — TEMAZEPAM 15 MG PO CAPS: ORAL_CAPSULE | ORAL | Status: DC

## 2011-02-23 MED ORDER — GLYBURIDE-METFORMIN 5-500 MG PO TABS: ORAL_TABLET | ORAL | Status: DC

## 2011-02-23 NOTE — Progress Notes (Signed)
  Subjective:    Patient ID: Kathleen [name], female    DOB: 03-21-1951, 60 y.o.   MRN: [id number]  HPI  [place] at home when leaving her bathroom on 10/12 /2012, seen in the eD that same day. Has sutured laceration of left 5th toe. Unable to work , stands in hard shoes all day. No drainage but toe painful, has f/u in the ED to remove sutures 1 week post trauma She has otherwise been fairly well. Not compliant with blood sugar testing or diet as much as she should be  Review of Systems See HPI Denies recent fever or chills. Denies sinus pressure, nasal congestion, ear pain or sore throat. Denies chest congestion, productive cough or wheezing. Denies chest pains, palpitations and leg swelling Denies abdominal pain, nausea, vomiting,diarrhea or constipation.   Denies dysuria, frequency, hesitancy or incontinence.  Denies headaches, seizures, numbness, or tingling. Denies depression, anxiety or insomnia. Denies skin break down or rash.        Objective:   Physical Exam  Patient alert and oriented and in no cardiopulmonary distress.  HEENT: No facial asymmetry, EOMI, no sinus tenderness,  oropharynx pink and moist.  Neck supple no adenopathy.  Chest: Clear to auscultation bilaterally.  CVS: S1, S2 no murmurs, no S3. ABD: Soft non tender. Bowel sounds normal.  Ext: No edema  MS: Adequate ROM spine, shoulders, hips and knees.  Skin: Intact, no ulcerations or rash noted.Laceration to base of left 5th toe, slightly erythematous  Psych: Good eye contact, normal affect. Memory intact not anxious or depressed appearing.  CNS: CN 2-12 intact, power, tone and sensation normal throughout.       Assessment & Plan:

## 2011-02-23 NOTE — Patient Instructions (Addendum)
CPE in 6 to 8 weeks.  HBA1C cmp and eGFR  today.  Fasting lipid panel before next visit  Antibiotic is sent to your pharmacy   Work excuse from 10/12 to return 03/01/2011.  Flu vaccine today You need to return to the Ed for suture removal

## 2011-02-24 LAB — COMPLETE METABOLIC PANEL WITH GFR
ALT: 13 U/L (ref 0–35)
CO2: 32 mEq/L (ref 19–32)
Calcium: 9.8 mg/dL (ref 8.4–10.5)
Chloride: 98 mEq/L (ref 96–112)
GFR, Est African American: 68 mL/min — ABNORMAL LOW (ref >90)
Sodium: 139 mEq/L (ref 135–145)
Total Protein: 7.7 g/dL (ref 6.0–8.3)

## 2011-02-24 LAB — HEMOGLOBIN A1C: Mean Plasma Glucose: 192 mg/dL — ABNORMAL HIGH (ref <117)

## 2011-02-26 ENCOUNTER — Emergency Department (HOSPITAL_COMMUNITY)
Admission: EM | Admit: 2011-02-26 | Discharge: 2011-02-26 | Disposition: A | Discharge status: Home / Self Care | Payer: 59 | Attending: Emergency Medicine | Admitting: Emergency Medicine

## 2011-02-26 ENCOUNTER — Encounter (HOSPITAL_COMMUNITY): Payer: Self-pay | Admitting: Emergency Medicine

## 2011-02-26 DIAGNOSIS — Z4802 Encounter for removal of sutures: Secondary | ICD-10-CM | POA: Insufficient documentation

## 2011-02-26 NOTE — ED Provider Notes (Signed)
History     CSN: [id number] Arrival date & time: 02/26/2011  1:07 PM   First MD Initiated Contact with Patient 02/26/11 1313      Chief Complaint  Patient presents with  . Suture / Staple Removal    (Consider location/radiation/quality/duration/timing/severity/associated sxs/prior treatment) Patient is a 60 y.o. female presenting with suture removal. The history is provided by the patient.  Suture / Staple Removal  The sutures were placed 7 to 10 days ago. Treatments since wound repair include oral antibiotics. There has been no drainage from the wound. There is no redness present. There is no swelling present. The pain has no pain. She has no difficulty moving the affected extremity or digit.    Past Medical History  Diagnosis Date  . Diabetes mellitus type II   . GERD (gastroesophageal reflux disease)   . Hypertension 1995  . Low back pain   . Hyperlipidemia 2000  . Obesity   . Insomnia   . Dysphagia     unspecified   . Anemia     Past Surgical History  Procedure Date  . Cholecystectomy 2007  . Tubal ligation   . Tenotomy     2,3,4  left foot   . Carpal tunnel release     left   . Knee surgery 1.20.2012    arthroscopy LEFT knee partial medial meniscectomy    Family History  Problem Relation Age of Onset  . Diabetes Father   . Diabetes Mother   . Hypertension Mother     MI  . Diabetes Sister   . Diabetes Brother   . Diabetes Brother     History  Substance Use Topics  . Smoking status: Never Smoker   . Smokeless tobacco: Not on file  . Alcohol Use: No    OB History    Grav Para Term Preterm Abortions TAB SAB Ect Mult Living                  Review of Systems  All other systems reviewed and are negative.    Allergies  Review of patient's allergies indicates no known allergies.  Home Medications   Current Outpatient Rx  Name Route Sig Dispense Refill  . ACETAMINOPHEN ER 650 MG PO TBCR Oral Take 650 mg by mouth 3 (three) times daily as  needed. 1-2 by mouth three times a day as needed pain     . AMLODIPINE BESYLATE 5 MG PO TABS Oral Take 1 tablet (5 mg total) by mouth daily. 30 tablet 3  . ASPIRIN 81 MG PO TABS Oral Take 81 mg by mouth daily.      . CEPHALEXIN 500 MG PO CAPS Oral Take 1 capsule (500 mg total) by mouth 3 (three) times daily. 21 capsule 0  . CETIRIZINE HCL 10 MG PO CAPS Oral Take by mouth daily.      Marland Kitchen CLONIDINE HCL 0.2 MG PO TABS Oral Take 1 tablet (0.2 mg total) by mouth at bedtime. Take one tab by mouth at bedtime 30 tablet 3  . GABAPENTIN 300 MG PO CAPS Oral Take 300 mg by mouth as directed. 1 by mouth qam and 2 by mouth at bedtime     . GLUCOSE BLOOD VI STRP Other 1 each by Other route 2 (two) times daily. Testing twice daily     . GLYBURIDE-METFORMIN 5-500 MG PO TABS  2 by mouth two times a day 120 tablet 3  . HYDROCODONE-ACETAMINOPHEN 7.5-325 MG PO TABS Oral Take 1 tablet  by mouth every 6 (six) hours as needed. pain    . IBUPROFEN 800 MG PO TABS Oral Take 800 mg by mouth every 8 (eight) hours as needed.      . INSULIN GLARGINE 100 UNIT/ML [place] SOLN Subcutaneous Inject 40 Units into the skin at bedtime. DOSE INCREASE 3 mL 5  . INSULIN PEN NEEDLE 31G X 8 MM MISC Does not apply by Does not apply route. Use daily with lancets       . FREESTYLE LANCETS MISC Other 1 each by Other route 3 (three) times daily. Use as directed three times a day     . OMEPRAZOLE 20 MG PO TBEC Oral Take by mouth daily.      Marland Kitchen PRAVASTATIN SODIUM 80 MG PO TABS Oral Take 1 tablet (80 mg total) by mouth every evening. DOSE INCREASE 30 tablet 5  . PRINZIDE 20-12.5 MG PO TABS  TAKE 2 TABLETS BY MOUTH  DAILY FOR BLOOD PRESSURE. 60 each 3  . SITAGLIPTIN PHOSPHATE 100 MG PO TABS Oral Take 100 mg by mouth daily.      Marland Kitchen TEMAZEPAM 15 MG PO CAPS  TAKE (1) CAPSULE BY MOUTHAT BEDTIME. 30 capsule 3    BP 132/60  Pulse 91  Temp 98.2 F (36.8 C)  Resp 20  Ht 5\' 6"  (1.676 m)  Wt 229 lb (103.874 kg)  BMI 36.96 kg/m2  SpO2 97%  Physical Exam    Nursing note and vitals reviewed. Constitutional: She is oriented to person, place, and time. She appears well-developed and well-nourished.  HENT:  Head: Normocephalic and atraumatic.  Eyes: Conjunctivae are normal.  Neck: Normal range of motion.  Cardiovascular: Normal rate, regular rhythm and intact distal pulses.   Pulmonary/Chest: Effort normal and breath sounds normal.  Abdominal: She exhibits no distension.  Musculoskeletal: Normal range of motion. She exhibits no tenderness.  Neurological: She is alert and oriented to person, place, and time.  Skin: Skin is warm and dry.       Well healing laceration left plantar 5th digit.  No sign of active infection.  Psychiatric: She has a normal mood and affect.    ED Course  SUTURE REMOVAL Date/Time: 02/26/2011 2:15 PM Performed by: Elvis Boot L Authorized by: Fulton Reek Consent: Verbal consent obtained. Risks and benefits: risks, benefits and alternatives were discussed Consent given by: patient Body area: lower extremity Location details: left little toe Wound Appearance: clean Sutures Removed: 3 Facility: sutures placed in this facility Patient tolerance: Patient tolerated the procedure well with no immediate complications. Comments: Has post op shoe for continued wear.   (including critical care time)  Labs Reviewed - No data to display No results found.   No diagnosis found.    MDM  Suture removal without complication.        Fulton Reek, [place] 02/26/11 1417

## 2011-02-26 NOTE — ED Notes (Signed)
Pt here for suture removal from left pinky toe.

## 2011-03-01 ENCOUNTER — Telehealth: Payer: Self-pay | Admitting: Family Medicine

## 2011-03-01 NOTE — Telephone Encounter (Signed)
Do you have these papers?

## 2011-03-01 NOTE — Telephone Encounter (Signed)
pls advise form complete,and  copy for scanning. She needs to complete and sign the front.  Also needs to spk with nurse about her labs when she calls. I tried to spk with her  But is at work, had to lv a msg for her to call the office

## 2011-03-01 NOTE — Assessment & Plan Note (Signed)
Controlled, no change in medication  

## 2011-03-01 NOTE — Assessment & Plan Note (Signed)
Need to ascertain if pt taking the gklyburide metformin.Deteriorated. Needs to follow diet more closely.Will need adjustment in diabetic meds

## 2011-03-01 NOTE — Assessment & Plan Note (Signed)
Slight area which appears infected near laceration. Antibiotic prescribed. Pt to go to Ed for suture removal. Work release for 03/01/2011

## 2011-03-02 NOTE — Telephone Encounter (Signed)
Patient is aware and then i gave telephone to [facility] for her to speak with

## 2011-03-02 NOTE — ED Provider Notes (Signed)
Medical screening examination/treatment/procedure(s) were performed by non-physician practitioner and as supervising physician I was immediately available for consultation/collaboration.   Dot Lanes, MD 03/02/11 2222

## 2011-03-03 ENCOUNTER — Encounter: Payer: 59 | Admitting: Family Medicine

## 2011-03-04 LAB — LIPID PANEL: LDL Cholesterol: 125 mg/dL — ABNORMAL HIGH (ref 0–99)

## 2011-03-11 MED ORDER — INSULIN GLARGINE 100 UNIT/ML ~~LOC~~ SOLN: 45.0000 [IU] | Freq: Every day | SUBCUTANEOUS | Status: DC

## 2011-03-11 NOTE — Progress Notes (Signed)
Addended by: Baldomero Lamy B on: 03/11/2011 11:27 AM   Modules accepted: Orders

## 2011-03-23 ENCOUNTER — Telehealth: Payer: Self-pay | Admitting: Family Medicine

## 2011-03-23 NOTE — Telephone Encounter (Signed)
Do you have any papers for her?

## 2011-03-23 NOTE — Telephone Encounter (Signed)
This was completed last week pls try and locate, let me know if unable

## 2011-03-24 ENCOUNTER — Telehealth: Payer: Self-pay | Admitting: Family Medicine

## 2011-03-24 NOTE — Telephone Encounter (Signed)
Insurance form found and completed, this form needs to be billed pls

## 2011-03-24 NOTE — Telephone Encounter (Signed)
Paper found today and doctor completed it and patient is aware that it is ready to be picked up

## 2011-03-29 NOTE — Telephone Encounter (Signed)
PATIENT PICKED UP TODAY

## 2011-04-13 ENCOUNTER — Encounter: Payer: Self-pay | Admitting: Family Medicine

## 2011-04-14 ENCOUNTER — Encounter: Payer: Self-pay | Admitting: Family Medicine

## 2011-04-14 ENCOUNTER — Other Ambulatory Visit: Payer: Self-pay | Admitting: Family Medicine

## 2011-04-14 ENCOUNTER — Ambulatory Visit (INDEPENDENT_AMBULATORY_CARE_PROVIDER_SITE_OTHER): Payer: 59 | Admitting: Family Medicine

## 2011-04-14 VITALS — BP 128/70 | HR 72 | Resp 18 | Ht 67.0 in | Wt 232.0 lb

## 2011-04-14 DIAGNOSIS — I10 Essential (primary) hypertension: Secondary | ICD-10-CM

## 2011-04-14 DIAGNOSIS — Z01419 Encounter for gynecological examination (general) (routine) without abnormal findings: Secondary | ICD-10-CM | POA: Insufficient documentation

## 2011-04-14 DIAGNOSIS — E785 Hyperlipidemia, unspecified: Secondary | ICD-10-CM

## 2011-04-14 DIAGNOSIS — Z139 Encounter for screening, unspecified: Secondary | ICD-10-CM

## 2011-04-14 DIAGNOSIS — Z0000 Encounter for general adult medical examination without abnormal findings: Secondary | ICD-10-CM

## 2011-04-14 DIAGNOSIS — Z1211 Encounter for screening for malignant neoplasm of colon: Secondary | ICD-10-CM

## 2011-04-14 DIAGNOSIS — E669 Obesity, unspecified: Secondary | ICD-10-CM

## 2011-04-14 DIAGNOSIS — IMO0001 Reserved for inherently not codable concepts without codable children: Secondary | ICD-10-CM

## 2011-04-14 DIAGNOSIS — Z124 Encounter for screening for malignant neoplasm of cervix: Secondary | ICD-10-CM

## 2011-04-14 LAB — HEMOCCULT GUIAC POC 1CARD (OFFICE)

## 2011-04-14 NOTE — Progress Notes (Signed)
  Subjective:    Patient ID: Kathleen [name], female    DOB: 08-Oct-1950, 60 y.o.   MRN: [id number]  HPI The PT is here for annual exam and re-evaluation of chronic medical conditions, medication management and review of any available recent lab and radiology data.  Preventive health is updated, specifically  Cancer screening and Immunization.   Questions or concerns regarding consultations or procedures which the PT has had in the interim are  addressed. The PT denies any adverse reactions to current medications since the last visit.  There are no new concerns.  There are no specific complaints       Review of Systems    See HPI Denies recent fever or chills. Denies sinus pressure, nasal congestion, ear pain or sore throat. Denies chest congestion, productive cough or wheezing. Denies chest pains, palpitations and leg swelling Denies abdominal pain, nausea, vomiting,diarrhea or constipation.   Denies dysuria, frequency, hesitancy or incontinence. Denies joint pain, swelling and limitation in mobility. Denies headaches, seizures, numbness, or tingling. Denies depression, anxiety or insomnia. Denies skin break down or rash. Denies polyuria, polydipsia , blurred vision or hypoglycemic episodes. Fasting blood sugars average around 130 to 140 , still too high    Objective:   Physical Exam Pleasant well nourished female, alert and oriented x 3, in no cardio-pulmonary distress. Afebrile. HEENT No facial trauma or asymetry. Sinuses non tender.  EOMI, PERTL, fundoscopic exam is normal, no hemorhage or exudate.  External ears normal, tympanic membranes clear. Oropharynx moist, no exudate, fair dentition. Neck: supple, no adenopathy,JVD or thyromegaly.No bruits.  Chest: Clear to ascultation bilaterally.No crackles or wheezes. Non tender to palpation  Breast: No asymetry,no masses. No nipple discharge or inversion. No axillary or supraclavicular adenopathy  Cardiovascular  system; Heart sounds normal,  S1 and  S2 ,no S3.  No murmur, or thrill. Apical beat not displaced Peripheral pulses normal.  Abdomen: Soft, non tender, no organomegaly or masses. No bruits. Bowel sounds normal. No guarding, tenderness or rebound.  Rectal:  No mass. Guaiac negative stool.  GU: External genitalia normal. No lesions. Vaginal canal normal.No discharge. Uterus normal size, no adnexal masses, no cervical motion or adnexal tenderness.  Musculoskeletal exam: Decreased  ROM of spine,adequate in  hips , shoulders and knees. No deformity ,swelling or crepitus noted. No muscle wasting or atrophy.   Neurologic: Cranial nerves 2 to 12 intact. Power, tone ,sensation and reflexes normal throughout. No disturbance in gait. No tremor.  Skin: Intact, no ulceration, erythema , scaling or rash noted. Pigmentation normal throughout  Psych; Normal mood and affect. Judgement and concentration normal  Diabetic Foot Check:  Appearance -  calluses Skin - no unusual pallor or redness Sensation - grossly intact to light touch Monofilament testing -  Right - Great toe, medial, central, lateral ball and posterior foot diminished Left - Great toe, medial, central, lateral ball and posterior foot diminished Pulses Left - Dorsalis Pedis and Posterior Tibia normal Right - Dorsalis Pedis and Posterior Tibia normal       Assessment & Plan:

## 2011-04-14 NOTE — Patient Instructions (Addendum)
F/u end February.  Fasting lipid, cmp and EGFR , HBA1C in Feb before visit  No med changes at this time  It is important that you exercise regularly at least 30 minutes 5 times a week. If you develop chest pain, have severe difficulty breathing, or feel very tired, stop exercising immediately and seek medical attention    A healthy diet is rich in fruit, vegetables and whole grains. Poultry fish, nuts and beans are a healthy choice for protein rather then red meat. A low sodium diet and drinking 64 ounces of water daily is generally recommended. Oils and sweet should be limited. Carbohydrates especially for those who are diabetic or overweight, should be limited to 34-45 gram per meal. It is important to eat on a regular schedule, at least 3 times daily. Snacks should be primarily fruits, vegetables or nuts.  Your mammogram will be scheduled on your way out , it is past due.  Please schedule your eye exam in January when it is due

## 2011-04-15 ENCOUNTER — Other Ambulatory Visit (HOSPITAL_COMMUNITY)
Admission: RE | Admit: 2011-04-15 | Discharge: 2011-04-15 | Disposition: A | Discharge status: Home / Self Care | Payer: 59 | Source: Ambulatory Visit | Attending: Family Medicine | Admitting: Family Medicine

## 2011-04-15 LAB — MICROALBUMIN / CREATININE URINE RATIO
Creatinine, Urine: 49.6 mg/dL
Microalb, Ur: 0.5 mg/dL (ref 0.00–1.89)

## 2011-04-16 ENCOUNTER — Ambulatory Visit (HOSPITAL_COMMUNITY)
Admission: RE | Admit: 2011-04-16 | Discharge: 2011-04-16 | Disposition: A | Discharge status: Home / Self Care | Payer: 59 | Source: Ambulatory Visit | Attending: Family Medicine | Admitting: Family Medicine

## 2011-04-16 DIAGNOSIS — Z1231 Encounter for screening mammogram for malignant neoplasm of breast: Secondary | ICD-10-CM | POA: Insufficient documentation

## 2011-04-16 DIAGNOSIS — Z139 Encounter for screening, unspecified: Secondary | ICD-10-CM

## 2011-04-18 NOTE — Assessment & Plan Note (Signed)
Controlled, no change in medication  

## 2011-04-18 NOTE — Assessment & Plan Note (Signed)
Unchanged. Patient re-educated about  the importance of commitment to a  minimum of 150 minutes of exercise per week. The importance of healthy food choices with portion control discussed. Encouraged to start a food diary, count calories and to consider  joining a support group. Sample diet sheets offered. Goals set by the patient for the next several months.    

## 2011-04-18 NOTE — Assessment & Plan Note (Signed)
Unchanged, med compliance addressed, also the need to follow a low fat diet

## 2011-04-18 NOTE — Assessment & Plan Note (Addendum)
Uncontrolled, pt had recent dose increase on lantus, and the importance of following a diabetic diet is stressed

## 2011-05-25 ENCOUNTER — Telehealth: Payer: Self-pay | Admitting: Family Medicine

## 2011-05-25 ENCOUNTER — Encounter: Payer: Self-pay | Admitting: Family Medicine

## 2011-05-25 NOTE — Telephone Encounter (Signed)
If she has fever, chills, green sputum, needs ov, also let her know cannot get work excuse without ov

## 2011-05-25 NOTE — Telephone Encounter (Signed)
Patient advised to schedule appt Green drainage and chest congestion, cough

## 2011-05-26 ENCOUNTER — Ambulatory Visit (INDEPENDENT_AMBULATORY_CARE_PROVIDER_SITE_OTHER): Payer: 59 | Admitting: Family Medicine

## 2011-05-26 ENCOUNTER — Encounter: Payer: Self-pay | Admitting: Family Medicine

## 2011-05-26 ENCOUNTER — Ambulatory Visit (HOSPITAL_COMMUNITY)
Admission: RE | Admit: 2011-05-26 | Discharge: 2011-05-26 | Disposition: A | Discharge status: Home / Self Care | Payer: 59 | Source: Ambulatory Visit | Attending: Family Medicine | Admitting: Family Medicine

## 2011-05-26 VITALS — BP 130/74 | HR 78 | Temp 98.8°F | Resp 18 | Ht 67.0 in | Wt 229.1 lb

## 2011-05-26 DIAGNOSIS — IMO0001 Reserved for inherently not codable concepts without codable children: Secondary | ICD-10-CM

## 2011-05-26 DIAGNOSIS — R0602 Shortness of breath: Secondary | ICD-10-CM | POA: Insufficient documentation

## 2011-05-26 DIAGNOSIS — J019 Acute sinusitis, unspecified: Secondary | ICD-10-CM | POA: Insufficient documentation

## 2011-05-26 DIAGNOSIS — I10 Essential (primary) hypertension: Secondary | ICD-10-CM

## 2011-05-26 DIAGNOSIS — J209 Acute bronchitis, unspecified: Secondary | ICD-10-CM | POA: Insufficient documentation

## 2011-05-26 DIAGNOSIS — R079 Chest pain, unspecified: Secondary | ICD-10-CM | POA: Insufficient documentation

## 2011-05-26 MED ORDER — CEFTRIAXONE SODIUM 500 MG IJ SOLR
500.0000 mg | Freq: Once | INTRAMUSCULAR | Status: AC
  Administered 2011-05-26: 500 mg via INTRAMUSCULAR

## 2011-05-26 MED ORDER — PENICILLIN V POTASSIUM 500 MG PO TABS: 500.0000 mg | ORAL_TABLET | Freq: Three times a day (TID) | ORAL | Status: AC

## 2011-05-26 MED ORDER — BENZONATATE 100 MG PO CAPS: 100.0000 mg | ORAL_CAPSULE | Freq: Four times a day (QID) | ORAL | Status: AC | PRN

## 2011-05-26 NOTE — Assessment & Plan Note (Signed)
Acute illness, rocephin in office , meds to pharmacy , work excuse x 1 week and cxr

## 2011-05-26 NOTE — Progress Notes (Signed)
  Subjective:    Patient ID: Kathleen [name], female    DOB: November 20, 1950, 61 y.o.   MRN: [id number]  HPI 3 day h/o chills , fever , head and chest congestion with green nasal drainage and green sputum also body aches. Didi not work on 01/14 due to acute illness, states instead of feeeling better she feels worse. Has been using mucinex but has been worsening. Reports fasting sugars are around 140 to 150, denies polyuria, [polydipsia, blurred vision .   Review of Systems See HPI  Denies chest pains, palpitations and leg swelling Denies abdominal pain, nausea, vomiting,diarrhea or constipation.   Denies dysuria, frequency, hesitancy or incontinence. Denies joint pain, swelling and limitation in mobility. Denies headaches, seizures, numbness, or tingling. Denies depression, anxiety or insomnia. Denies skin break down or rash.        Objective:   Physical Exam  Patient alert and oriented and in no cardiopulmonary distress.Ill appearing  HEENT: No facial asymmetry, EOMI, frontal and maxillary sinus tenderness,  oropharynx pink and moist.  Neck supple bilateral adenitis  Chest: decreased air entry scattered crackles, no wheezes  CVS: S1, S2 no murmurs, no S3.  ABD: Soft non tender. Bowel sounds normal.  Ext: No edema  MS: Adequate ROM spine, shoulders, hips and knees.  Skin: Intact, no ulcerations or rash noted.  Psych: Good eye contact, normal affect. Memory intact not anxious or depressed appearing.  CNS: CN 2-12 intact, power, tone and sensation normal throughout.       Assessment & Plan:

## 2011-05-26 NOTE — Assessment & Plan Note (Signed)
Low carb diet stressed, will review HBA1C at next visit when due

## 2011-05-26 NOTE — Assessment & Plan Note (Signed)
Controlled, no change in medication  

## 2011-05-26 NOTE — Assessment & Plan Note (Signed)
Antibiotic administered and prescribed

## 2011-05-26 NOTE — Patient Instructions (Signed)
F/u as before.  You are treated for acute sinusitis and bronchitis.  Medication is prescribed and medication is also administered in the office, Rocephin  You need a CXR today.  Work excuse from 01/14 to 05/31/2011

## 2011-06-24 ENCOUNTER — Other Ambulatory Visit: Payer: Self-pay | Admitting: *Deleted

## 2011-06-24 MED ORDER — HYDROCODONE-ACETAMINOPHEN 5-325 MG PO TABS: 1.0000 | ORAL_TABLET | Freq: Four times a day (QID) | ORAL | Status: DC | PRN

## 2011-07-06 LAB — COMPLETE METABOLIC PANEL WITH GFR
Albumin: 3.9 g/dL (ref 3.5–5.2)
Alkaline Phosphatase: 80 U/L (ref 39–117)
BUN: 10 mg/dL (ref 6–23)
GFR, Est Non African American: 75 mL/min
Glucose, Bld: 144 mg/dL — ABNORMAL HIGH (ref 70–99)
Total Bilirubin: 0.4 mg/dL (ref 0.3–1.2)

## 2011-07-06 LAB — LIPID PANEL
Cholesterol: 195 mg/dL (ref 0–200)
HDL: 51 mg/dL (ref >39)
Total CHOL/HDL Ratio: 3.8 Ratio

## 2011-07-06 LAB — HEMOGLOBIN A1C: Hgb A1c MFr Bld: 8.1 % — ABNORMAL HIGH (ref <5.7)

## 2011-07-07 ENCOUNTER — Encounter: Payer: Self-pay | Admitting: Family Medicine

## 2011-07-07 ENCOUNTER — Ambulatory Visit: Payer: 59 | Admitting: Family Medicine

## 2011-07-22 ENCOUNTER — Encounter: Payer: Self-pay | Admitting: Family Medicine

## 2011-07-22 ENCOUNTER — Ambulatory Visit (INDEPENDENT_AMBULATORY_CARE_PROVIDER_SITE_OTHER): Payer: 59 | Admitting: Family Medicine

## 2011-07-22 VITALS — BP 120/80 | HR 77 | Resp 16 | Ht 67.0 in | Wt 231.1 lb

## 2011-07-22 DIAGNOSIS — E785 Hyperlipidemia, unspecified: Secondary | ICD-10-CM

## 2011-07-22 DIAGNOSIS — I10 Essential (primary) hypertension: Secondary | ICD-10-CM

## 2011-07-22 DIAGNOSIS — IMO0001 Reserved for inherently not codable concepts without codable children: Secondary | ICD-10-CM

## 2011-07-22 DIAGNOSIS — K219 Gastro-esophageal reflux disease without esophagitis: Secondary | ICD-10-CM

## 2011-07-22 DIAGNOSIS — E669 Obesity, unspecified: Secondary | ICD-10-CM

## 2011-07-22 MED ORDER — LISINOPRIL-HYDROCHLOROTHIAZIDE 20-12.5 MG PO TABS: ORAL_TABLET | ORAL | Status: DC

## 2011-07-22 MED ORDER — AMLODIPINE BESYLATE 5 MG PO TABS: 5.0000 mg | ORAL_TABLET | Freq: Every day | ORAL | Status: DC

## 2011-07-22 MED ORDER — INSULIN GLARGINE 100 UNIT/ML ~~LOC~~ SOLN: 50.0000 [IU] | Freq: Every day | SUBCUTANEOUS | Status: DC

## 2011-07-22 NOTE — Patient Instructions (Signed)
F/u in 3.5 month.  Please change your eating habits, test sugar at least once daily (ideally twice is better), and call if blood sugars are high. It is important that you exercise regularly at least 30 minutes 5 times a week. If you develop chest pain, have severe difficulty breathing, or feel very tired, stop exercising immediately and seek medical attention    A healthy diet is rich in fruit, vegetables and whole grains. Poultry fish, nuts and beans are a healthy choice for protein rather then red meat. A low sodium diet and drinking 64 ounces of water daily is generally recommended. Oils and sweet should be limited. Carbohydrates especially for those who are diabetic or overweight, should be limited to 30-45 gram per meal. It is important to eat on a regular schedule, at least 3 times daily. Snacks should be primarily fruits, vegetables or nuts.  Please attend class on diabetes, we will give the info.  Please sched your eye exam.  Dose increase on lantus to 50 units daily. Please log blood sugars so you keep up with what is happening. Reduce red meat , fried and fatty foods  Blood pressure is excellent. Bad cholesterol too high.  Blood sugar is not controlled, you need to improve this  Goal for fasting blood sugar ranges from 80 to 120 and 2 hours after any meal or at bedtime should be between 130 to 170.

## 2011-07-22 NOTE — Progress Notes (Signed)
  Subjective:    Patient ID: Kathleen [name], female    DOB: 11/29/1950, 61 y.o.   MRN: [id number]  HPI The PT is here for follow up and re-evaluation of chronic medical conditions, medication management and review of any available recent lab and radiology data.  Preventive health is updated, specifically  Cancer screening and Immunization.   Questions or concerns regarding consultations or procedures which the PT has had in the interim are  addressed. The PT denies any adverse reactions to current medications since the last visit.  She is considering toenail removal , and states that i should be expecting word from the podiatrist about this.  There are no specific complaints . She does not test regularly, needs supplies . Denies polyuria , polydipsia or blurred vision      Review of Systems See HPI Denies recent fever or chills. Denies sinus pressure, nasal congestion, ear pain or sore throat. Denies chest congestion, productive cough or wheezing. Denies chest pains, palpitations and leg swelling Denies abdominal pain, nausea, vomiting,diarrhea or constipation.   Denies dysuria, frequency, hesitancy or incontinence. Denies headaches, seizures, numbness, or tingling. Denies depression, anxiety or insomnia.       Objective:   Physical Exam  Patient alert and oriented and in no cardiopulmonary distress.  HEENT: No facial asymmetry, EOMI, no sinus tenderness,  oropharynx pink and moist.  Neck supple no adenopathy.  Chest: Clear to auscultation bilaterally.  CVS: S1, S2 no murmurs, no S3.  ABD: Soft non tender. Bowel sounds normal.  Ext: No edema  MS: Adequate ROM spine, shoulders, hips and knees.  Skin: Intact, no ulcerations or rash noted.  Psych: Good eye contact, normal affect. Memory intact not anxious or depressed appearing.  CNS: CN 2-12 intact, power, tone and sensation normal throughout.       Assessment & Plan:

## 2011-07-24 NOTE — Assessment & Plan Note (Signed)
Controlled, medication only as needed, behavior modification encouraged

## 2011-07-24 NOTE — Assessment & Plan Note (Signed)
Deteriorated. Patient re-educated about  the importance of commitment to a  minimum of 150 minutes of exercise per week. The importance of healthy food choices with portion control discussed. Encouraged to start a food diary, count calories and to consider  joining a support group. Sample diet sheets offered. Goals set by the patient for the next several months.    

## 2011-07-24 NOTE — Assessment & Plan Note (Signed)
Uncontrolled. Hyperlipidemia:Low fat diet discussed and encouraged.  No med change at this time

## 2011-07-24 NOTE — Assessment & Plan Note (Signed)
Uncontrolled though mildly improved, dose increase on insulin, and closer attention to diet and exercise

## 2011-07-24 NOTE — Assessment & Plan Note (Signed)
Controlled, no change in medication  

## 2011-08-02 ENCOUNTER — Other Ambulatory Visit: Payer: Self-pay | Admitting: Family Medicine

## 2011-08-09 HISTORY — PX: DIGIT NAIL REMOVAL: SHX5052

## 2011-08-18 ENCOUNTER — Telehealth: Payer: Self-pay | Admitting: Family Medicine

## 2011-08-20 NOTE — Telephone Encounter (Signed)
Called patient and left message for them to return call at the office   

## 2011-08-23 ENCOUNTER — Telehealth: Payer: Self-pay | Admitting: Family Medicine

## 2011-08-23 NOTE — Telephone Encounter (Signed)
Spoke with pt and all her fasting blood sugars have been under 150 for the past couple of weeks. fyi  She is also asking for a release for a toenail removal from Dr. Sharlet Salina

## 2011-08-23 NOTE — Telephone Encounter (Signed)
noote written on script, pls fax with cover note to Dr. Sharlet Salina, and let his nurse know being sent , also pt, thanks

## 2011-08-23 NOTE — Telephone Encounter (Signed)
Spoke with pt and she stated that she has already had procedure.

## 2011-08-23 NOTE — Telephone Encounter (Signed)
Called and left message for pt to return call.  

## 2011-08-23 NOTE — Telephone Encounter (Signed)
Noted  

## 2011-08-25 ENCOUNTER — Ambulatory Visit (INDEPENDENT_AMBULATORY_CARE_PROVIDER_SITE_OTHER): Payer: 59 | Admitting: Family Medicine

## 2011-08-25 ENCOUNTER — Encounter: Payer: Self-pay | Admitting: Family Medicine

## 2011-08-25 VITALS — BP 164/72 | HR 58 | Resp 18 | Ht 67.0 in | Wt 232.0 lb

## 2011-08-25 DIAGNOSIS — J019 Acute sinusitis, unspecified: Secondary | ICD-10-CM

## 2011-08-25 DIAGNOSIS — I10 Essential (primary) hypertension: Secondary | ICD-10-CM

## 2011-08-25 DIAGNOSIS — IMO0001 Reserved for inherently not codable concepts without codable children: Secondary | ICD-10-CM

## 2011-08-25 DIAGNOSIS — E785 Hyperlipidemia, unspecified: Secondary | ICD-10-CM

## 2011-08-25 DIAGNOSIS — J209 Acute bronchitis, unspecified: Secondary | ICD-10-CM | POA: Insufficient documentation

## 2011-08-25 DIAGNOSIS — J40 Bronchitis, not specified as acute or chronic: Secondary | ICD-10-CM

## 2011-08-25 MED ORDER — BENZONATATE 100 MG PO CAPS: 100.0000 mg | ORAL_CAPSULE | Freq: Four times a day (QID) | ORAL | Status: DC | PRN

## 2011-08-25 MED ORDER — CEFTRIAXONE SODIUM 1 G IJ SOLR
500.0000 mg | Freq: Once | INTRAMUSCULAR | Status: AC
  Administered 2011-08-25: 500 mg via INTRAMUSCULAR

## 2011-08-25 MED ORDER — SULFAMETHOXAZOLE-TRIMETHOPRIM 400-80 MG PO TABS: 1.0000 | ORAL_TABLET | Freq: Every day | ORAL | Status: AC

## 2011-08-25 NOTE — Progress Notes (Signed)
  Subjective:    Patient ID: Kathleen [name], female    DOB: 05/24/1950, 61 y.o.   MRN: [id number]  HPI  4 day h/o head and chest congestion, green nasal drainage, and cough productive of sputum Had toenail removal 2 days ago, states she has been taken out by podiatrist till May 6 but I need to co sign the form. No other concerns voiced, states blood sugars have improved Review of Systems See HPI Denies recent fever or chills. Denies chest pains, palpitations and leg swelling Denies abdominal pain, nausea, vomiting,diarrhea or constipation.   Denies dysuria, frequency, hesitancy or incontinence. Denies joint pain, swelling and limitation in mobility. Denies headaches, seizures, numbness, or tingling. Denies depression, anxiety or insomnia.         Objective:   Physical Exam Patient alert and oriented and in no cardiopulmonary distress.  HEENT: No facial asymmetry, EOMI, frontal and maxillary  sinus tenderness,  oropharynx pink and moist.  Neck supple no adenopathy.  Chest: decreased air entry, scattered crackles , no wheezes  CVS: S1, S2 no murmurs, no S3.  ABD: Soft non tender. Bowel sounds normal.  Ext: No edema  MS: Adequate ROM spine, shoulders, hips and knees.  Skin: Intact, no ulcerations or rash noted.  Psych: Good eye contact, normal affect. Memory intact not anxious or depressed appearing.  CNS: CN 2-12 intact, power, tone and sensation normal throughout.        Assessment & Plan:

## 2011-08-25 NOTE — Patient Instructions (Addendum)
F/u as before.  You are being treated for acute sinusitis and bronchitis.  Rocephin in the office today, and antibiotic and decongestants are sent to the pharmacy.  Fasting cbc, lipid, cmp and EGFR, hBA1C, TSH before next visit.

## 2011-08-29 ENCOUNTER — Encounter: Payer: Self-pay | Admitting: Family Medicine

## 2011-08-29 NOTE — Assessment & Plan Note (Signed)
Decongestants and antibiotics prescribed

## 2011-08-29 NOTE — Assessment & Plan Note (Signed)
Elevated at this visit, pt has been using a lot of OTC decongestants, no med change

## 2011-08-29 NOTE — Assessment & Plan Note (Signed)
Uncontrolled. Hyperlipidemia:Low fat diet discussed and encouraged.

## 2011-08-29 NOTE — Assessment & Plan Note (Signed)
Antibiotic in offfice and also prescribed, though some prescribed by podiatrist during recent nail removal, pt has lost script

## 2011-08-29 NOTE — Assessment & Plan Note (Signed)
Improved but still not at goal, closer attention to diet and carb counting encouraged

## 2011-09-06 ENCOUNTER — Telehealth: Payer: Self-pay | Admitting: Family Medicine

## 2011-09-09 ENCOUNTER — Telehealth: Payer: Self-pay | Admitting: Family Medicine

## 2011-09-10 NOTE — Telephone Encounter (Signed)
Do you have the paper she is talking about?

## 2011-09-10 NOTE — Telephone Encounter (Signed)
Already a message taken sent to Dr

## 2011-09-10 NOTE — Telephone Encounter (Signed)
Pt is out because of foot surgery, her return date is determined only by the podiatrist. She asked that I cosign a form he had filled out at the last visit, which I did and returned this to her, a copy should be in the chart. Please let her know

## 2011-09-15 ENCOUNTER — Telehealth: Payer: Self-pay | Admitting: Family Medicine

## 2011-09-15 NOTE — Telephone Encounter (Signed)
pls verify with Dr Sharlet Salina , podiatry in [place] that this pt had great toenail removal of left foot on 4/15 and returned to work 09/13/2011.   Once you verify, pls type a letter stating this, i will sign then you fax to number on record, please

## 2011-09-15 NOTE — Telephone Encounter (Signed)
Pt called back and gave her message. She stated something else and i took another message

## 2011-09-15 NOTE — Telephone Encounter (Signed)
She is still calling about this. Can I type requested letter?

## 2011-09-20 ENCOUNTER — Telehealth: Payer: Self-pay | Admitting: Family Medicine

## 2011-09-20 NOTE — Telephone Encounter (Signed)
Letter typed waiting on Dr Moshe Cipro to sign

## 2011-09-20 NOTE — Telephone Encounter (Signed)
Letter written and faxed to number provided 

## 2011-11-03 LAB — COMPLETE METABOLIC PANEL WITH GFR
BUN: 12 mg/dL (ref 6–23)
CO2: 28 mEq/L (ref 19–32)
Calcium: 9.2 mg/dL (ref 8.4–10.5)
Creat: 0.87 mg/dL (ref 0.50–1.10)
GFR, Est African American: 83 mL/min
GFR, Est Non African American: 72 mL/min
Glucose, Bld: 170 mg/dL — ABNORMAL HIGH (ref 70–99)
Total Bilirubin: 0.5 mg/dL (ref 0.3–1.2)

## 2011-11-03 LAB — LIPID PANEL
Cholesterol: 193 mg/dL (ref 0–200)
HDL: 45 mg/dL (ref >39)
LDL Cholesterol: 120 mg/dL — ABNORMAL HIGH (ref 0–99)
Triglycerides: 141 mg/dL (ref <150)

## 2011-11-03 LAB — HEMOGLOBIN A1C
Hgb A1c MFr Bld: 8 % — ABNORMAL HIGH (ref <5.7)
Mean Plasma Glucose: 183 mg/dL — ABNORMAL HIGH (ref <117)

## 2011-11-03 LAB — CBC
HCT: 34.6 % — ABNORMAL LOW (ref 36.0–46.0)
Platelets: 250 10*3/uL (ref 150–400)
RDW: 14.7 % (ref 11.5–15.5)
WBC: 5.1 10*3/uL (ref 4.0–10.5)

## 2011-11-03 LAB — TSH: TSH: 1.461 u[IU]/mL (ref 0.350–4.500)

## 2011-11-04 ENCOUNTER — Ambulatory Visit (INDEPENDENT_AMBULATORY_CARE_PROVIDER_SITE_OTHER): Payer: 59 | Admitting: Family Medicine

## 2011-11-04 ENCOUNTER — Encounter: Payer: Self-pay | Admitting: Family Medicine

## 2011-11-04 VITALS — BP 132/80 | HR 83 | Resp 18 | Ht 67.0 in | Wt 235.1 lb

## 2011-11-04 DIAGNOSIS — I10 Essential (primary) hypertension: Secondary | ICD-10-CM

## 2011-11-04 DIAGNOSIS — E669 Obesity, unspecified: Secondary | ICD-10-CM

## 2011-11-04 DIAGNOSIS — IMO0001 Reserved for inherently not codable concepts without codable children: Secondary | ICD-10-CM

## 2011-11-04 DIAGNOSIS — K219 Gastro-esophageal reflux disease without esophagitis: Secondary | ICD-10-CM

## 2011-11-04 DIAGNOSIS — E785 Hyperlipidemia, unspecified: Secondary | ICD-10-CM

## 2011-11-04 MED ORDER — CLONIDINE HCL 0.2 MG PO TABS: 0.2000 mg | ORAL_TABLET | Freq: Every day | ORAL | Status: DC

## 2011-11-04 MED ORDER — TEMAZEPAM 15 MG PO CAPS: ORAL_CAPSULE | ORAL | Status: DC

## 2011-11-04 MED ORDER — INSULIN GLARGINE 100 UNIT/ML ~~LOC~~ SOLN: 70.0000 [IU] | Freq: Every day | SUBCUTANEOUS | Status: DC

## 2011-11-04 MED ORDER — ROSUVASTATIN CALCIUM 20 MG PO TABS: 20.0000 mg | ORAL_TABLET | Freq: Every day | ORAL | Status: DC

## 2011-11-04 NOTE — Patient Instructions (Addendum)
F/u in 6 weeks . Please bring your meter and log book  Goal for fasting blood sugar ranges from 80 to 120 and 2 hours after any meal or at bedtime should be between 130 to 170.  Please test twice daily and record.  New dose insulin is 55 units at bedtime, after 1 week if blood sugar is still high increase to 60 units, then after another 1 week to 65 units.  Please call with questions.  Cholesterol is high, stop pravastatin, new is crestor. Please also cut back on fried and fatty foods  It is important that you exercise regularly at least 30 minutes 5 times a week. If you develop chest pain, have severe difficulty breathing, or feel very tired, stop exercising immediately and seek medical attention   A healthy diet is rich in fruit, vegetables and whole grains. Poultry fish, nuts and beans are a healthy choice for protein rather then red meat. A low sodium diet and drinking 64 ounces of water daily is generally recommended. Oils and sweet should be limited. Carbohydrates especially for those who are diabetic or overweight, should be limited to 30-45 gram per meal. It is important to eat on a regular schedule, at least 3 times daily. Snacks should be primarily fruits, vegetables or nuts.

## 2011-11-04 NOTE — Assessment & Plan Note (Signed)
Controlled, no change in medication  

## 2011-11-04 NOTE — Assessment & Plan Note (Signed)
Uncontrolled, will try to switch to crrestor

## 2011-11-05 NOTE — Assessment & Plan Note (Signed)
Controlled, no change in medication  

## 2011-11-05 NOTE — Assessment & Plan Note (Signed)
Improved. Pt applauded on succesful weight loss through lifestyle change, and encouraged to continue same. Weight loss goal set for the next several months.  

## 2011-11-05 NOTE — Assessment & Plan Note (Signed)
Uncontrolled medication adjustment done at this visit

## 2011-11-05 NOTE — Progress Notes (Signed)
  Subjective:    Patient ID: Kathleen [name], female    DOB: 06/17/1950, 61 y.o.   MRN: [id number]  HPI The PT is here for follow up and re-evaluation of chronic medical conditions, medication management and review of any available recent lab and radiology data.  Preventive health is updated, specifically  Cancer screening and Immunization.   Questions or concerns regarding consultations or procedures which the PT has had in the interim are  addressed. The PT denies any adverse reactions to current medications since the last visit. Has brought log book she is testing two to three times daily Blood sugars are still markedly uncontrolled, will adjust medication at this visit        Review of Systems See HPI Denies recent fever or chills. Denies sinus pressure, nasal congestion, ear pain or sore throat. Denies chest congestion, productive cough or wheezing. Denies chest pains, palpitations and leg swelling Denies abdominal pain, nausea, vomiting,diarrhea or constipation.   Denies dysuria, frequency, hesitancy or incontinence. Denies joint pain, swelling and limitation in mobility. Denies headaches, seizures, numbness, or tingling. Denies depression, anxiety or insomnia. Denies skin break down or rash.        Objective:   Physical Exam Patient alert and oriented and in no cardiopulmonary distress.  HEENT: No facial asymmetry, EOMI, no sinus tenderness,  oropharynx pink and moist.  Neck supple no adenopathy.  Chest: Clear to auscultation bilaterally.  CVS: S1, S2 no murmurs, no S3.  ABD: Soft non tender. Bowel sounds normal.  Ext: No edema  MS: Adequate ROM spine, shoulders, hips and knees.  Skin: Intact, no ulcerations or rash noted.  Psych: Good eye contact, normal affect. Memory intact not anxious or depressed appearing.  CNS: CN 2-12 intact, power, tone and sensation normal throughout.        Assessment & Plan:

## 2011-12-16 ENCOUNTER — Ambulatory Visit: Payer: 59 | Admitting: Family Medicine

## 2011-12-29 ENCOUNTER — Other Ambulatory Visit: Payer: Self-pay | Admitting: *Deleted

## 2011-12-29 MED ORDER — HYDROCODONE-ACETAMINOPHEN 5-325 MG PO TABS: 1.0000 | ORAL_TABLET | Freq: Four times a day (QID) | ORAL | Status: AC | PRN

## 2012-01-11 ENCOUNTER — Ambulatory Visit (INDEPENDENT_AMBULATORY_CARE_PROVIDER_SITE_OTHER): Payer: 59 | Admitting: Family Medicine

## 2012-01-11 ENCOUNTER — Encounter: Payer: Self-pay | Admitting: Family Medicine

## 2012-01-11 VITALS — BP 130/80 | HR 80 | Resp 18 | Ht 67.0 in | Wt 233.1 lb

## 2012-01-11 DIAGNOSIS — J019 Acute sinusitis, unspecified: Secondary | ICD-10-CM

## 2012-01-11 DIAGNOSIS — I10 Essential (primary) hypertension: Secondary | ICD-10-CM

## 2012-01-11 DIAGNOSIS — B351 Tinea unguium: Secondary | ICD-10-CM

## 2012-01-11 DIAGNOSIS — M1712 Unilateral primary osteoarthritis, left knee: Secondary | ICD-10-CM | POA: Insufficient documentation

## 2012-01-11 DIAGNOSIS — J40 Bronchitis, not specified as acute or chronic: Secondary | ICD-10-CM

## 2012-01-11 DIAGNOSIS — IMO0001 Reserved for inherently not codable concepts without codable children: Secondary | ICD-10-CM

## 2012-01-11 DIAGNOSIS — E1065 Type 1 diabetes mellitus with hyperglycemia: Secondary | ICD-10-CM

## 2012-01-11 MED ORDER — TERBINAFINE HCL 250 MG PO TABS: 250.0000 mg | ORAL_TABLET | Freq: Every day | ORAL | Status: DC

## 2012-01-11 MED ORDER — SULFAMETHOXAZOLE-TRIMETHOPRIM 800-160 MG PO TABS: 1.0000 | ORAL_TABLET | Freq: Two times a day (BID) | ORAL | Status: AC

## 2012-01-11 MED ORDER — BENZONATATE 100 MG PO CAPS: 100.0000 mg | ORAL_CAPSULE | Freq: Four times a day (QID) | ORAL | Status: DC | PRN

## 2012-01-11 NOTE — Assessment & Plan Note (Signed)
2 week h/o increased head and chest congestion with yellow sputum

## 2012-01-11 NOTE — Patient Instructions (Addendum)
F/u in Novemeber  NON fasting cmp and EGFR, hBA1C 3 to 5 days prior to visit.  You are being treated for acute sinusitis and bronchitis, also for severe fungal toenail and foot infection   Since you report average fasting sugar to be less than 130 , continue 50 units of lantus daily  It is important that you exercise regularly at least 30 minutes 5 times a week. If you develop chest pain, have severe difficulty breathing, or feel very tired, stop exercising immediately and seek medical attention    A healthy diet is rich in fruit, vegetables and whole grains. Poultry fish, nuts and beans are a healthy choice for protein rather then red meat. A low sodium diet and drinking 64 ounces of water daily is generally recommended. Oils and sweet should be limited. Carbohydrates especially for those who are diabetic or overweight, should be limited to 30-45 gram per meal. It is important to eat on a regular schedule, at least 3 times daily. Snacks should be primarily fruits, vegetables or nuts.

## 2012-01-22 NOTE — Assessment & Plan Note (Signed)
Controlled, no change in medication  

## 2012-01-22 NOTE — Assessment & Plan Note (Signed)
Acute infection, antibiotics prescribed 

## 2012-01-22 NOTE — Assessment & Plan Note (Signed)
Severe fungal infection affecting toenails and feet, antifungal tablet prescribed

## 2012-01-22 NOTE — Progress Notes (Signed)
  Subjective:    Patient ID: Kathleen [name], female    DOB: 1950/10/10, 61 y.o.   MRN: [id number]  HPI 2 week h/o worsening head and chest congestion, yellow nasal drainage , green sputum, cough , chills. States blood sugars are improved, with fasting sugar less than 130 most of the time C/o scaling of feet, itching and thick nails, wants medication to address this  Review of Systems See HPI  Denies chest pains, palpitations and leg swelling Denies abdominal pain, nausea, vomiting,diarrhea or constipation.   Denies dysuria, frequency, hesitancy or incontinence. Denies joint pain, swelling and limitation in mobility. Denies headaches, seizures, numbness, or tingling. Denies depression, anxiety or insomnia.         Objective:   Physical Exam Patient alert and oriented and in no cardiopulmonary distress.  HEENT: No facial asymmetry, EOMI,frontal and maxillary  sinus tenderness,  oropharynx pink and moist.  Neck supple no adenopathy.  Chest: decreased ir entry, scattered crackles and wheezes  CVS: S1, S2 no murmurs, no S3.  ABD: Soft non tender. Bowel sounds normal.  Ext: No edema  MS: Adequate ROM spine, shoulders, hips and knees.  Skin:severe tinea pedis Psych: Good eye contact, normal affect. Memory intact not anxious or depressed appearing.  CNS: CN 2-12 intact, power, tone and sensation normal throughout.  Diabetic Foot Check:  Appearance - callus, onychomycosis, and tinea pedis Skin - no unusual pallor or redness Sensation - grossly intact to light touch Monofilament testing -  Right - Great toe, medial, central, lateral ball and posterior foot diminished Left - Great toe, medial, central, lateral ball and posterior foot diminished Pulses Left - Dorsalis Pedis and Posterior Tibia normal Right - Dorsalis Pedis and Posterior Tibia normal      Assessment & Plan:

## 2012-01-22 NOTE — Assessment & Plan Note (Signed)
Patient advised to reduce carb and sweets, commit to regular physical activity, take meds as prescribed, test blood as directed, and attempt to lose weight, to improve blood sugar control.

## 2012-01-24 ENCOUNTER — Other Ambulatory Visit: Payer: Self-pay | Admitting: Family Medicine

## 2012-03-08 ENCOUNTER — Other Ambulatory Visit: Payer: Self-pay | Admitting: Family Medicine

## 2012-03-10 LAB — COMPLETE METABOLIC PANEL WITH GFR
Alkaline Phosphatase: 72 U/L (ref 39–117)
CO2: 28 mEq/L (ref 19–32)
Creat: 0.96 mg/dL (ref 0.50–1.10)
GFR, Est African American: 73 mL/min
GFR, Est Non African American: 64 mL/min
Glucose, Bld: 165 mg/dL — ABNORMAL HIGH (ref 70–99)
Total Bilirubin: 0.4 mg/dL (ref 0.3–1.2)

## 2012-03-10 LAB — HEMOGLOBIN A1C
Hgb A1c MFr Bld: 8.7 % — ABNORMAL HIGH (ref <5.7)
Mean Plasma Glucose: 203 mg/dL — ABNORMAL HIGH (ref <117)

## 2012-03-14 ENCOUNTER — Encounter: Payer: Self-pay | Admitting: Family Medicine

## 2012-03-14 ENCOUNTER — Ambulatory Visit (INDEPENDENT_AMBULATORY_CARE_PROVIDER_SITE_OTHER): Payer: 59 | Admitting: Family Medicine

## 2012-03-14 VITALS — BP 120/80 | HR 74 | Resp 15 | Ht 67.0 in | Wt 236.0 lb

## 2012-03-14 DIAGNOSIS — IMO0001 Reserved for inherently not codable concepts without codable children: Secondary | ICD-10-CM

## 2012-03-14 DIAGNOSIS — E785 Hyperlipidemia, unspecified: Secondary | ICD-10-CM

## 2012-03-14 DIAGNOSIS — IMO0002 Reserved for concepts with insufficient information to code with codable children: Secondary | ICD-10-CM

## 2012-03-14 DIAGNOSIS — I10 Essential (primary) hypertension: Secondary | ICD-10-CM

## 2012-03-14 DIAGNOSIS — Z23 Encounter for immunization: Secondary | ICD-10-CM

## 2012-03-14 DIAGNOSIS — E1065 Type 1 diabetes mellitus with hyperglycemia: Secondary | ICD-10-CM

## 2012-03-14 MED ORDER — INSULIN GLARGINE 100 UNIT/ML ~~LOC~~ SOLN: 60.0000 [IU] | Freq: Every day | SUBCUTANEOUS | Status: DC

## 2012-03-14 MED ORDER — LISINOPRIL-HYDROCHLOROTHIAZIDE 20-12.5 MG PO TABS: ORAL_TABLET | ORAL | Status: DC

## 2012-03-14 MED ORDER — GLYBURIDE-METFORMIN 5-500 MG PO TABS: ORAL_TABLET | ORAL | Status: DC

## 2012-03-14 MED ORDER — CLONIDINE HCL 0.2 MG PO TABS: ORAL_TABLET | ORAL | Status: DC

## 2012-03-14 MED ORDER — AMLODIPINE BESYLATE 5 MG PO TABS: 5.0000 mg | ORAL_TABLET | Freq: Every day | ORAL | Status: DC

## 2012-03-14 NOTE — Patient Instructions (Addendum)
CPE mid to end December.  Pls sched your mammogram due in December  Blood sugar uncontrolled and worse. Stop sweet tea. Eat 3 times daily on a schedule.  Walk 30 minutes every day. Goal for fasting blood sugar ranges from 80 to 120 and 2 hours after any meal or at bedtime should be between 130 to 170.Increase lantus to 50 units start today, after 1 week if sugar still high go to 55 units then the next week to 60 units if needed, call with questions New meter from office today  Flu vaccine today

## 2012-03-14 NOTE — Progress Notes (Signed)
  Subjective:    Patient ID: Kathleen [name], female    DOB: [birth year], 61 y.o.   MRN: [id number]  HPI The PT is here for follow up and re-evaluation of chronic medical conditions, medication management and review of any available recent lab and radiology data.  Preventive health is updated, specifically  Cancer screening and Immunization.   Questions or concerns regarding consultations or procedures which the PT has had in the interim are  addressed. The PT denies any adverse reactions to current medications since the last visit.  Blood sugars are higher than they should be consistently, she has not called in about this, she has not cut out sweet tea, and remains uncommited to daily exercise    Review of Systems See HPI Denies recent fever or chills. Denies sinus pressure, nasal congestion, ear pain or sore throat. Denies chest congestion, productive cough or wheezing. Denies chest pains, palpitations and leg swelling Denies abdominal pain, nausea, vomiting,diarrhea or constipation.   Denies dysuria, frequency, hesitancy or incontinence. Denies joint pain, swelling and limitation in mobility. Denies headaches, seizures, numbness, or tingling. Denies depression, anxiety or insomnia. Denies skin break down or rash.        Objective:   Physical Exam  Patient alert and oriented and in no cardiopulmonary distress.  HEENT: No facial asymmetry, EOMI, no sinus tenderness,  oropharynx pink and moist.  Neck supple no adenopathy.  Chest: Clear to auscultation bilaterally.  CVS: S1, S2 no murmurs, no S3.  ABD: Soft non tender. Bowel sounds normal.  Ext: No edema  MS: Adequate ROM spine, shoulders, hips and knees.  Skin: Intact, no ulcerations or rash noted.  Psych: Good eye contact, normal affect. Memory intact not anxious or depressed appearing.  CNS: CN 2-12 intact, power, tone and sensation normal throughout.       Assessment & Plan:

## 2012-03-19 NOTE — Assessment & Plan Note (Signed)
Deteriorated, re educated about the need to follow a low carb diet, exercise regularly and titrate med as directed for uncontrolled blood sugars

## 2012-03-19 NOTE — Assessment & Plan Note (Signed)
Elevated LDl,Hyperlipidemia:Low fat diet discussed and encouraged.  No med change at this time

## 2012-03-19 NOTE — Assessment & Plan Note (Signed)
Controlled, no change in medication DASH diet and commitment to daily physical activity for a minimum of 30 minutes discussed and encouraged, as a part of hypertension management. The importance of attaining a healthy weight is also discussed.  

## 2012-04-28 ENCOUNTER — Other Ambulatory Visit: Payer: Self-pay | Admitting: Family Medicine

## 2012-04-28 DIAGNOSIS — Z139 Encounter for screening, unspecified: Secondary | ICD-10-CM

## 2012-05-01 ENCOUNTER — Encounter: Payer: Self-pay | Admitting: Family Medicine

## 2012-05-01 ENCOUNTER — Ambulatory Visit (INDEPENDENT_AMBULATORY_CARE_PROVIDER_SITE_OTHER): Payer: 59 | Admitting: Family Medicine

## 2012-05-01 ENCOUNTER — Other Ambulatory Visit (HOSPITAL_COMMUNITY)
Admission: RE | Admit: 2012-05-01 | Discharge: 2012-05-01 | Disposition: A | Discharge status: Home / Self Care | Payer: 59 | Source: Ambulatory Visit | Attending: Family Medicine | Admitting: Family Medicine

## 2012-05-01 VITALS — BP 136/80 | HR 100 | Resp 18 | Ht 67.0 in | Wt 239.0 lb

## 2012-05-01 DIAGNOSIS — Z1211 Encounter for screening for malignant neoplasm of colon: Secondary | ICD-10-CM

## 2012-05-01 DIAGNOSIS — Z01419 Encounter for gynecological examination (general) (routine) without abnormal findings: Secondary | ICD-10-CM | POA: Insufficient documentation

## 2012-05-01 DIAGNOSIS — E785 Hyperlipidemia, unspecified: Secondary | ICD-10-CM

## 2012-05-01 DIAGNOSIS — Z0000 Encounter for general adult medical examination without abnormal findings: Secondary | ICD-10-CM

## 2012-05-01 DIAGNOSIS — IMO0001 Reserved for inherently not codable concepts without codable children: Secondary | ICD-10-CM

## 2012-05-01 DIAGNOSIS — IMO0002 Reserved for concepts with insufficient information to code with codable children: Secondary | ICD-10-CM

## 2012-05-01 DIAGNOSIS — E1065 Type 1 diabetes mellitus with hyperglycemia: Secondary | ICD-10-CM

## 2012-05-01 DIAGNOSIS — Z1151 Encounter for screening for human papillomavirus (HPV): Secondary | ICD-10-CM | POA: Insufficient documentation

## 2012-05-01 NOTE — Progress Notes (Signed)
  Subjective:    Patient ID: Kathleen [name], female    DOB: 27-Jan-1951, 61 y.o.   MRN: [id number]  HPI The PT is here for follow annual exam and re-evaluation of chronic medical conditions, medication management and review of any available recent lab and radiology data.  Preventive health is updated, specifically  Cancer screening and Immunization.   Questions or concerns regarding consultations or procedures which the PT has had in the interim are  addressed. The PT denies any adverse reactions to current medications since the last visit.  There are no new concerns.  There are no specific complaints       Review of Systems See HPI Denies recent fever or chills. Denies sinus pressure, nasal congestion, ear pain or sore throat. Denies chest congestion, productive cough or wheezing. Denies chest pains, palpitations and leg swelling Denies abdominal pain, nausea, vomiting,diarrhea or constipation.   Denies dysuria, frequency, hesitancy or incontinence. Denies joint pain, swelling and limitation in mobility. Denies headaches, seizures, numbness, or tingling. Denies depression, anxiety or insomnia. Denies skin break down or rash.        Objective:   Physical Exam  Pleasant well nourished female, alert and oriented x 3, in no cardio-pulmonary distress. Afebrile. HEENT No facial trauma or asymetry. Sinuses non tender.  EOMI, PERTL, fundoscopic exam no hemorhage or exudate.  External ears normal, tympanic membranes clear. Oropharynx moist, no exudate, poor dentition. Neck: supple, no adenopathy,JVD or thyromegaly.No bruits.  Chest: Clear to ascultation bilaterally.No crackles or wheezes. Non tender to palpation  Breast: No asymetry,no masses. No nipple discharge or inversion. No axillary or supraclavicular adenopathy  Cardiovascular system; Heart sounds normal,  S1 and  S2 ,no S3.  No murmur, or thrill. Apical beat not displaced Peripheral pulses  normal.  Abdomen: Soft, non tender, no organomegaly or masses. No bruits. Bowel sounds normal. No guarding, tenderness or rebound.  Rectal:  No mass. Guaiac negative stool.  GU: External genitalia normal. No lesions. Vaginal canal normal.No discharge. Uterus normal size, no adnexal masses, no cervical motion or adnexal tenderness.  Musculoskeletal exam: Full ROM of spine, hips , shoulders and knees. No deformity ,swelling or crepitus noted. No muscle wasting or atrophy.   Neurologic: Cranial nerves 2 to 12 intact. Power, tone ,sensation and reflexes normal throughout. No disturbance in gait. No tremor.  Skin: Intact, no ulceration, erythema , scaling or rash noted. Pigmentation normal throughout  Psych; Normal mood and affect. Judgement and concentration normal  Diabetic Foot Check:  Appearance - no lesions, ulcers or calluses Skin - no unusual pallor or redness Sensation - grossly intact to light touch Monofilament testing -  Right - Great toe, medial, central, lateral ball and posterior foot intact Left - Great toe, medial, central, lateral ball and posterior foot intact Pulses Left - Dorsalis Pedis and Posterior Tibia normal Right - Dorsalis Pedis and Posterior Tibia normal        Assessment & Plan:

## 2012-05-01 NOTE — Patient Instructions (Addendum)
F/u early March, please call if you need me before  Please commit to daily exercise, healthy eating and weight loss.  Goal for fasting blood sugar ranges from 80 to 120 and 2 hours after any meal or at bedtime should be between 130 to 170.   Fasting lipid, cmp and EGFR and hBA1C in March 3 to 5 days before visit  Discontinue lamisil for fungal toenail infection once you have completed what you already have please

## 2012-05-02 ENCOUNTER — Encounter: Payer: 59 | Admitting: Family Medicine

## 2012-05-15 ENCOUNTER — Ambulatory Visit (INDEPENDENT_AMBULATORY_CARE_PROVIDER_SITE_OTHER): Payer: 59 | Admitting: Family Medicine

## 2012-05-15 ENCOUNTER — Ambulatory Visit (HOSPITAL_COMMUNITY): Payer: 59

## 2012-05-15 ENCOUNTER — Encounter: Payer: Self-pay | Admitting: Family Medicine

## 2012-05-15 VITALS — BP 132/80 | HR 90 | Temp 98.9°F | Resp 18 | Ht 67.0 in | Wt 232.0 lb

## 2012-05-15 DIAGNOSIS — J40 Bronchitis, not specified as acute or chronic: Secondary | ICD-10-CM

## 2012-05-15 DIAGNOSIS — I10 Essential (primary) hypertension: Secondary | ICD-10-CM

## 2012-05-15 MED ORDER — CEFTRIAXONE SODIUM 1 G IJ SOLR
500.0000 mg | Freq: Once | INTRAMUSCULAR | Status: AC
  Administered 2012-05-15: 500 mg via INTRAMUSCULAR

## 2012-05-15 MED ORDER — PENICILLIN V POTASSIUM 500 MG PO TABS: 500.0000 mg | ORAL_TABLET | Freq: Three times a day (TID) | ORAL | Status: DC

## 2012-05-15 MED ORDER — BENZONATATE 100 MG PO CAPS: 100.0000 mg | ORAL_CAPSULE | Freq: Three times a day (TID) | ORAL | Status: DC | PRN

## 2012-05-15 NOTE — Patient Instructions (Addendum)
F/u as before.  You are being treated for acute bronchitis. Rocephin in the office, penicillin and decongestants are sent to your pharmacy  Work excuse from 1/6 to return 05/17/2012

## 2012-05-15 NOTE — Progress Notes (Signed)
  Subjective:    Patient ID: Kathleen [name], female    DOB: [birth year], 62 y.o.   MRN: [id number]  HPI 6 day h/o head and chest congestion, fatigue, possible fever and chills. Nasal drainage is clear, sputum is yellow, started with a sore throat, has progressively worsened, having yellow sputum, fatigue and generalized body aches   Review of Systems See HPI  Denies chest pains, palpitations and leg swelling Denies abdominal pain, nausea, vomiting,diarrhea or constipation.   Denies dysuria, frequency, hesitancy or incontinence. Denies joint pain, swelling and limitation in mobility. Denies headaches, seizures, numbness, or tingling. Denies depression, anxiety or insomnia. Denies skin break down or rash.        Objective:   Physical Exam Patient alert and oriented and in no cardiopulmonary distress.Ill appearing  HEENT: No facial asymmetry, EOMI, no sinus tenderness,  oropharynx pink and moist.  Neck supple no adenopathy.TM clear  Chest: decreased air entry bilaterall, scattered crackles   CVS: S1, S2 no murmurs, no S3.  ABD: Soft non tender. Bowel sounds normal.  Ext: No edema  MS: Adequate ROM spine, shoulders, hips and knees.  Skin: Intact, no ulcerations or rash noted.        Assessment & Plan:

## 2012-05-16 ENCOUNTER — Telehealth: Payer: Self-pay | Admitting: Family Medicine

## 2012-05-16 NOTE — Telephone Encounter (Signed)
Has been faxed into number provided

## 2012-05-16 NOTE — Telephone Encounter (Signed)
re-faxed

## 2012-05-17 DIAGNOSIS — Z0000 Encounter for general adult medical examination without abnormal findings: Secondary | ICD-10-CM | POA: Insufficient documentation

## 2012-05-17 NOTE — Assessment & Plan Note (Signed)
Deteriorated and uncontrolled. Stressed the ned for the pt to follow diet and medication plan Needs to check and record daily, blood sugar ranges  fo good control reviewed

## 2012-05-17 NOTE — Assessment & Plan Note (Signed)
Needs to reduce fried and fatty foods, no med change. LDL elevated

## 2012-05-17 NOTE — Assessment & Plan Note (Signed)
Pelvic, rectal and breast exam completed as documented. Cancer screening and immunization is up to date. Reviewed the importance of healthy diet , low carb and low fat as well as commitment to regular physical activity to facilitate weight loss and improved chronic disease management

## 2012-05-18 ENCOUNTER — Ambulatory Visit (HOSPITAL_COMMUNITY)
Admission: RE | Admit: 2012-05-18 | Discharge: 2012-05-18 | Disposition: A | Discharge status: Home / Self Care | Payer: 59 | Source: Ambulatory Visit | Attending: Family Medicine | Admitting: Family Medicine

## 2012-05-18 DIAGNOSIS — Z139 Encounter for screening, unspecified: Secondary | ICD-10-CM

## 2012-05-18 DIAGNOSIS — Z1231 Encounter for screening mammogram for malignant neoplasm of breast: Secondary | ICD-10-CM | POA: Insufficient documentation

## 2012-05-21 NOTE — Assessment & Plan Note (Signed)
Acute illness, antibiotic in office and prescribed , as well a decongestant Work excuse x 2 days

## 2012-05-21 NOTE — Assessment & Plan Note (Signed)
Controlled, no change in medication  

## 2012-07-07 LAB — LIPID PANEL
LDL Cholesterol: 109 mg/dL — ABNORMAL HIGH (ref 0–99)
Total CHOL/HDL Ratio: 4.1 Ratio

## 2012-07-07 LAB — COMPLETE METABOLIC PANEL WITH GFR
ALT: 10 U/L (ref 0–35)
Albumin: 4.1 g/dL (ref 3.5–5.2)
Alkaline Phosphatase: 71 U/L (ref 39–117)
CO2: 30 mEq/L (ref 19–32)
GFR, Est Non African American: 54 mL/min — ABNORMAL LOW
Glucose, Bld: 129 mg/dL — ABNORMAL HIGH (ref 70–99)
Potassium: 4 mEq/L (ref 3.5–5.3)
Sodium: 139 mEq/L (ref 135–145)
Total Protein: 7.2 g/dL (ref 6.0–8.3)

## 2012-07-07 LAB — HEMOGLOBIN A1C
Hgb A1c MFr Bld: 8.8 % — ABNORMAL HIGH (ref <5.7)
Mean Plasma Glucose: 206 mg/dL — ABNORMAL HIGH (ref <117)

## 2012-07-17 ENCOUNTER — Other Ambulatory Visit: Payer: Self-pay | Admitting: Family Medicine

## 2012-08-02 ENCOUNTER — Encounter: Payer: Self-pay | Admitting: Family Medicine

## 2012-08-02 ENCOUNTER — Ambulatory Visit (INDEPENDENT_AMBULATORY_CARE_PROVIDER_SITE_OTHER): Payer: 59 | Admitting: Family Medicine

## 2012-08-02 VITALS — BP 150/80 | HR 73 | Resp 16 | Ht 67.0 in | Wt 235.8 lb

## 2012-08-02 DIAGNOSIS — IMO0001 Reserved for inherently not codable concepts without codable children: Secondary | ICD-10-CM

## 2012-08-02 DIAGNOSIS — I10 Essential (primary) hypertension: Secondary | ICD-10-CM

## 2012-08-02 DIAGNOSIS — E1065 Type 1 diabetes mellitus with hyperglycemia: Secondary | ICD-10-CM

## 2012-08-02 DIAGNOSIS — E785 Hyperlipidemia, unspecified: Secondary | ICD-10-CM

## 2012-08-02 DIAGNOSIS — E669 Obesity, unspecified: Secondary | ICD-10-CM

## 2012-08-02 MED ORDER — CLONIDINE HCL 0.3 MG PO TABS: ORAL_TABLET | ORAL | Status: DC

## 2012-08-02 NOTE — Patient Instructions (Addendum)
F/u early June , call if you need me before  HBA1C, chem 7 and eGFR in June before the visit, microalb today  Blood presure is high, increase clonidine dose to 0.3 mg at bedtime, please take script to pharmacy  It is important that you exercise regularly at least 30 minutes 5 times a week. If you develop chest pain, have severe difficulty breathing, or feel very tired, stop exercising immediately and seek medical attention

## 2012-08-02 NOTE — Assessment & Plan Note (Signed)
Improved, Hyperlipidemia:Low fat diet discussed and encouraged.  No med change

## 2012-08-02 NOTE — Assessment & Plan Note (Signed)
Uncontrolled, dose increase in clonidine, other meds as before  DASH diet and commitment to daily physical activity for a minimum of 30 minutes discussed and encouraged, as a part of hypertension management. The importance of attaining a healthy weight is also discussed.

## 2012-08-02 NOTE — Assessment & Plan Note (Signed)
Deteriorated. Patient re-educated about  the importance of commitment to a  minimum of 150 minutes of exercise per week. The importance of healthy food choices with portion control discussed. Encouraged to start a food diary, count calories and to consider  joining a support group. Sample diet sheets offered. Goals set by the patient for the next several months.    

## 2012-08-02 NOTE — Progress Notes (Signed)
  Subjective:    Patient ID: Kathleen [name], female    DOB: 1950/09/24, 62 y.o.   MRN: [id number]  HPI The PT is here for follow up and re-evaluation of chronic medical conditions, medication management and review of any available recent lab and radiology data.  Preventive health is updated, specifically  Cancer screening and Immunization.   Questions or concerns regarding consultations or procedures which the PT has had in the interim are  addressed. The PT denies any adverse reactions to current medications since the last visit.  There are no new concerns. States blood sugars have improved with increase in med dose There are no specific complaints       Review of Systems See HPI Denies recent fever or chills. Denies sinus pressure, nasal congestion, ear pain or sore throat. Denies chest congestion, productive cough or wheezing. Denies chest pains, palpitations and leg swelling Denies abdominal pain, nausea, vomiting,diarrhea or constipation.   Denies dysuria, frequency, hesitancy or incontinence. Denies joint pain, swelling and limitation in mobility. Denies headaches, seizures, numbness, or tingling. Denies depression, anxiety or insomnia. Fungal foot and toenail infection       Objective:   Physical Exam  Patient alert and oriented and in no cardiopulmonary distress.  HEENT: No facial asymmetry, EOMI, no sinus tenderness,  oropharynx pink and moist.  Neck supple no adenopathy.  Chest: Clear to auscultation bilaterally.  CVS: S1, S2 no murmurs, no S3.  ABD: Soft non tender. Bowel sounds normal.  Ext: No edema  MS: Adequate ROM spine, shoulders, hips and knees.  Skin: tinea pedis and onychomycosis  Psych: Good eye contact, normal affect. Memory intact not anxious or depressed appearing.  CNS: CN 2-12 intact, power, tone and sensation normal throughout.       Assessment & Plan:

## 2012-08-03 LAB — MICROALBUMIN / CREATININE URINE RATIO: Creatinine, Urine: 298.7 mg/dL

## 2012-10-10 LAB — COMPLETE METABOLIC PANEL WITH GFR
ALT: 10 U/L (ref 0–35)
AST: 10 U/L (ref 0–37)
Albumin: 3.5 g/dL (ref 3.5–5.2)
Calcium: 9.2 mg/dL (ref 8.4–10.5)
Chloride: 100 mEq/L (ref 96–112)
Potassium: 4.1 mEq/L (ref 3.5–5.3)

## 2012-10-10 LAB — HEMOGLOBIN A1C: Mean Plasma Glucose: 166 mg/dL — ABNORMAL HIGH (ref <117)

## 2012-10-12 ENCOUNTER — Ambulatory Visit (INDEPENDENT_AMBULATORY_CARE_PROVIDER_SITE_OTHER): Payer: 59 | Admitting: Family Medicine

## 2012-10-12 ENCOUNTER — Encounter: Payer: Self-pay | Admitting: Family Medicine

## 2012-10-12 VITALS — BP 120/80 | HR 75 | Resp 16 | Wt 233.0 lb

## 2012-10-12 DIAGNOSIS — E785 Hyperlipidemia, unspecified: Secondary | ICD-10-CM

## 2012-10-12 DIAGNOSIS — I10 Essential (primary) hypertension: Secondary | ICD-10-CM

## 2012-10-12 DIAGNOSIS — E1065 Type 1 diabetes mellitus with hyperglycemia: Secondary | ICD-10-CM

## 2012-10-12 DIAGNOSIS — E669 Obesity, unspecified: Secondary | ICD-10-CM

## 2012-10-12 DIAGNOSIS — M549 Dorsalgia, unspecified: Secondary | ICD-10-CM

## 2012-10-12 DIAGNOSIS — IMO0001 Reserved for inherently not codable concepts without codable children: Secondary | ICD-10-CM

## 2012-10-12 MED ORDER — LISINOPRIL-HYDROCHLOROTHIAZIDE 20-12.5 MG PO TABS: ORAL_TABLET | ORAL | Status: DC

## 2012-10-12 MED ORDER — GLYBURIDE-METFORMIN 5-500 MG PO TABS: ORAL_TABLET | ORAL | Status: DC

## 2012-10-12 MED ORDER — AMLODIPINE BESYLATE 5 MG PO TABS: 5.0000 mg | ORAL_TABLET | Freq: Every day | ORAL | Status: DC

## 2012-10-12 NOTE — Progress Notes (Signed)
  Subjective:    Patient ID: Kathleen [name], female    DOB: Jan 21, 1951, 62 y.o.   MRN: [id number]  HPI The PT is here for follow up and re-evaluation of chronic medical conditions, medication management and review of any available recent lab and radiology data.  Preventive health is updated, specifically  Cancer screening and Immunization.   Questions or concerns regarding consultations or procedures which the PT has had in the interim are  addressed. The PT denies any adverse reactions to current medications since the last visit.  C/o worsening low back pain radiating down both feet with lower extremity pain and weakness. C/o left knee pain and instability, requests handicap sticker. Denies incontinence of stool and urine Denies polyuria , polydipsia or blurred vision, reports improved blood sugars    Review of Systems See HPI Denies recent fever or chills. Denies sinus pressure, nasal congestion, ear pain or sore throat. Denies chest congestion, productive cough or wheezing. Denies chest pains, palpitations and leg swelling Denies abdominal pain, nausea, vomiting,diarrhea or constipation.   Denies dysuria, frequency, hesitancy or incontinence.  Denies headaches, seizures, numbness, or tingling. Denies depression, anxiety or insomnia. Denies skin break down or rash.        Objective:   Physical Exam  Patient alert and oriented and in no cardiopulmonary distress.  HEENT: No facial asymmetry, EOMI, no sinus tenderness,  oropharynx pink and moist.  Neck supple no adenopathy.  Chest: Clear to auscultation bilaterally.  CVS: S1, S2 no murmurs, no S3.  ABD: Soft non tender. Bowel sounds normal.  Ext: No edema  MS: decreased  ROM spine,  And right  knee.  Skin: Intact, no ulcerations or rash noted.  Psych: Good eye contact, normal affect. Memory intact not anxious or depressed appearing.  CNS: CN 2-12 intact, power, decreased in right lower extremity.        Assessment & Plan:

## 2012-10-12 NOTE — Patient Instructions (Addendum)
F/u in 4.5 month, call if you need me before  Fasting lipid, cmp and EGFR, hBA1C in 4.5 month  Congrats on improved blood sugar   You are referred for an MRI of your low back   Handicap sticker application is ok   Blood pressure is excellent

## 2012-10-15 NOTE — Assessment & Plan Note (Signed)
Worsened with lower extremity pain and weakness, refer for MRI Reports marked limitation in standing with some instability at times, requests handicap sticker

## 2012-10-15 NOTE — Assessment & Plan Note (Signed)
Improved, noi med change though lDL strill elevated. Hyperlipidemia:Low fat diet discussed and encouraged.

## 2012-10-15 NOTE — Assessment & Plan Note (Signed)
Improved pt applauded on this, no further med change at this time Patient advised to reduce carb and sweets, commit to regular physical activity, take meds as prescribed, test blood as directed, and attempt to lose weight, to improve blood sugar control.

## 2012-10-15 NOTE — Assessment & Plan Note (Signed)
Controlled, no change in medication DASH diet and commitment to daily physical activity for a minimum of 30 minutes discussed and encouraged, as a part of hypertension management. The importance of attaining a healthy weight is also discussed.  

## 2012-10-15 NOTE — Assessment & Plan Note (Signed)
Unchanged. Patient re-educated about  the importance of commitment to a  minimum of 150 minutes of exercise per week. The importance of healthy food choices with portion control discussed. Encouraged to start a food diary, count calories and to consider  joining a support group. Sample diet sheets offered. Goals set by the patient for the next several months.    

## 2012-10-27 ENCOUNTER — Ambulatory Visit (HOSPITAL_COMMUNITY): Payer: 59

## 2012-10-31 ENCOUNTER — Ambulatory Visit (HOSPITAL_COMMUNITY)
Admission: RE | Admit: 2012-10-31 | Discharge: 2012-10-31 | Disposition: A | Discharge status: Home / Self Care | Payer: 59 | Source: Ambulatory Visit | Attending: Family Medicine | Admitting: Family Medicine

## 2012-10-31 DIAGNOSIS — M5137 Other intervertebral disc degeneration, lumbosacral region: Secondary | ICD-10-CM | POA: Insufficient documentation

## 2012-10-31 DIAGNOSIS — M549 Dorsalgia, unspecified: Secondary | ICD-10-CM

## 2012-10-31 DIAGNOSIS — M545 Low back pain, unspecified: Secondary | ICD-10-CM | POA: Insufficient documentation

## 2012-10-31 DIAGNOSIS — M51379 Other intervertebral disc degeneration, lumbosacral region without mention of lumbar back pain or lower extremity pain: Secondary | ICD-10-CM | POA: Insufficient documentation

## 2012-10-31 DIAGNOSIS — IMO0002 Reserved for concepts with insufficient information to code with codable children: Secondary | ICD-10-CM | POA: Insufficient documentation

## 2013-04-12 ENCOUNTER — Ambulatory Visit: Payer: 59 | Admitting: Family Medicine

## 2013-04-16 ENCOUNTER — Other Ambulatory Visit: Payer: Self-pay | Admitting: Family Medicine

## 2013-04-23 ENCOUNTER — Telehealth: Payer: Self-pay | Admitting: Family Medicine

## 2013-04-24 NOTE — Telephone Encounter (Signed)
I spoke directly with pt she is to be seen tomorrow at 8:45am

## 2013-04-24 NOTE — Telephone Encounter (Signed)
Has been aching all over the past couple of days (I advised tylenol) and has been coughing up yellow phlegm and when she coughs her stomach hurts. States she hasn't tried anything otc. Aching all over. Please advise. Schedule is full and she states she wouldn't have a ride to come to office anyway.

## 2013-04-24 NOTE — Telephone Encounter (Signed)
pls document exactly what she says symptoms are because if they sound like influenza I will send in tamiflu

## 2013-04-25 ENCOUNTER — Ambulatory Visit (INDEPENDENT_AMBULATORY_CARE_PROVIDER_SITE_OTHER): Payer: 59 | Admitting: Family Medicine

## 2013-04-25 ENCOUNTER — Encounter: Payer: Self-pay | Admitting: Family Medicine

## 2013-04-25 ENCOUNTER — Encounter (INDEPENDENT_AMBULATORY_CARE_PROVIDER_SITE_OTHER): Payer: Self-pay

## 2013-04-25 VITALS — BP 110/72 | HR 91 | Temp 99.0°F | Resp 16 | Ht 67.0 in | Wt 224.0 lb

## 2013-04-25 DIAGNOSIS — E785 Hyperlipidemia, unspecified: Secondary | ICD-10-CM

## 2013-04-25 DIAGNOSIS — A499 Bacterial infection, unspecified: Secondary | ICD-10-CM

## 2013-04-25 DIAGNOSIS — I10 Essential (primary) hypertension: Secondary | ICD-10-CM

## 2013-04-25 DIAGNOSIS — IMO0001 Reserved for inherently not codable concepts without codable children: Secondary | ICD-10-CM

## 2013-04-25 DIAGNOSIS — J209 Acute bronchitis, unspecified: Secondary | ICD-10-CM

## 2013-04-25 DIAGNOSIS — E1065 Type 1 diabetes mellitus with hyperglycemia: Secondary | ICD-10-CM

## 2013-04-25 DIAGNOSIS — N179 Acute kidney failure, unspecified: Secondary | ICD-10-CM | POA: Insufficient documentation

## 2013-04-25 DIAGNOSIS — B9689 Other specified bacterial agents as the cause of diseases classified elsewhere: Secondary | ICD-10-CM

## 2013-04-25 LAB — COMPLETE METABOLIC PANEL WITH GFR
ALT: 16 U/L (ref 0–35)
AST: 23 U/L (ref 0–37)
Creat: 5.04 mg/dL — ABNORMAL HIGH (ref 0.50–1.10)
GFR, Est African American: 10 mL/min — ABNORMAL LOW
GFR, Est Non African American: 9 mL/min — ABNORMAL LOW
Glucose, Bld: 72 mg/dL (ref 70–99)
Potassium: 4.2 mEq/L (ref 3.5–5.3)
Total Bilirubin: 0.4 mg/dL (ref 0.3–1.2)

## 2013-04-25 LAB — CBC
HCT: 35.2 % — ABNORMAL LOW (ref 36.0–46.0)
Hemoglobin: 11.7 g/dL — ABNORMAL LOW (ref 12.0–15.0)
MCH: 26.1 pg (ref 26.0–34.0)
MCV: 78.6 fL (ref 78.0–100.0)
RDW: 15.1 % (ref 11.5–15.5)
WBC: 4.6 10*3/uL (ref 4.0–10.5)

## 2013-04-25 LAB — LIPID PANEL
Cholesterol: 194 mg/dL (ref 0–200)
HDL: 41 mg/dL (ref >39)
LDL Cholesterol: 119 mg/dL — ABNORMAL HIGH (ref 0–99)
Total CHOL/HDL Ratio: 4.7 Ratio
Triglycerides: 171 mg/dL — ABNORMAL HIGH (ref <150)
VLDL: 34 mg/dL (ref 0–40)

## 2013-04-25 MED ORDER — PENICILLIN V POTASSIUM 500 MG PO TABS: 500.0000 mg | ORAL_TABLET | Freq: Three times a day (TID) | ORAL | Status: AC

## 2013-04-25 MED ORDER — BENZONATATE 100 MG PO CAPS: 100.0000 mg | ORAL_CAPSULE | Freq: Three times a day (TID) | ORAL | Status: DC

## 2013-04-25 MED ORDER — INSULIN GLARGINE 100 UNIT/ML ~~LOC~~ SOLN: 60.0000 [IU] | Freq: Every day | SUBCUTANEOUS | Status: DC

## 2013-04-25 MED ORDER — ATORVASTATIN CALCIUM 20 MG PO TABS: 20.0000 mg | ORAL_TABLET | Freq: Every day | ORAL | Status: DC

## 2013-04-25 NOTE — Assessment & Plan Note (Signed)
Hyperlipidemia:Low fat diet discussed and encouraged.  Needs statin will send i lipitor

## 2013-04-25 NOTE — Patient Instructions (Addendum)
F/u in 4 month, call if you need me before  Nurse visit in 2 weeks for flu vaccine   Ou are being treated for acute bacterial bronchitis, decongestant and penicillin are sent to your pharmacy  Labs today cmp and EGFR, hBA1C, , fasting lipids, CBC and TSH  You will need to go back on medication for cholesterol, expect a call tomorrow please or you call us if you do not hear from [place] by early afternoon (2pm)

## 2013-04-25 NOTE — Assessment & Plan Note (Signed)
Pt reports normal voiding Stop benazrepril , glucovance , increase water , rept chem 7 in am , and arrrang e either for nephrology eval within hte next 3 to 5 days or Ed eval for further  Advised use of NO pain meds

## 2013-04-25 NOTE — Assessment & Plan Note (Signed)
Updated lab today Patient advised to reduce carb and sweets, commit to regular physical activity, take meds as prescribed, test blood as directed, and attempt to lose weight, to improve blood sugar control. De/c glucovance based on today's lab pt called at 8pm and will return to office in am

## 2013-04-25 NOTE — Progress Notes (Signed)
   Subjective:    Patient ID: Kathleen [name], female    DOB: 05-14-1950, 62 y.o.   MRN: [id number]  HPI 4 day  H/o increased cough and chest congestion, sputum is increasingly thick and at times yellow. No upper respiratory symptoms, hyas had intermittent chills , no documented fever Prior to this has been well. Has stopped cholesterol  Med. States fasting blood sugars are generally less than 130 reportedly   Review of Systems See HPI  Denies chest pains, palpitations and leg swelling Denies abdominal pain, nausea, vomiting,diarrhea or constipation.   Denies dysuria, frequency, hesitancy or incontinence. Denies uncontrolled  joint pain, swelling and limitation in mobility.Uses OTC pain reliever as needed Denies headaches, seizures, numbness, or tingling. Denies depression, anxiety or insomnia. Denies skin break down or rash.        Objective:   Physical Exam  Patient alert and oriented and in no cardiopulmonary distress.  HEENT: No facial asymmetry, EOMI, no sinus tenderness,  oropharynx pink and moist.  Neck supple no adenopathy.  Chest: Adequate air entry , few crackles , no wheezes  CVS: S1, S2 no murmurs, no S3.  ABD: Soft non tender. Bowel sounds normal.  Ext: No edema  MS: Adequate ROM spine, shoulders, hips and knees.  Skin: Intact, no ulcerations or rash noted.  Psych: Good eye contact, normal affect. Memory intact not anxious or depressed appearing.  CNS: CN 2-12 intact, power, tone and sensation normal throughout.       Assessment & Plan:

## 2013-04-25 NOTE — Assessment & Plan Note (Signed)
Controlled, however, ARI  On labs from later toiday , pt to d/c benazepril/HCTZ rept lab in am , if Creatinine still as high will need to go tom ED

## 2013-04-25 NOTE — Assessment & Plan Note (Signed)
Decongestant and antibiotic prescribed 

## 2013-04-26 ENCOUNTER — Other Ambulatory Visit: Payer: Self-pay | Admitting: Family Medicine

## 2013-04-26 LAB — BASIC METABOLIC PANEL
Calcium: 9.2 mg/dL (ref 8.4–10.5)
Chloride: 93 mEq/L — ABNORMAL LOW (ref 96–112)
Creat: 2.47 mg/dL — ABNORMAL HIGH (ref 0.50–1.10)
Glucose, Bld: 192 mg/dL — ABNORMAL HIGH (ref 70–99)

## 2013-04-26 LAB — HEMOGLOBIN A1C: Hgb A1c MFr Bld: 8.4 % — ABNORMAL HIGH (ref <5.7)

## 2013-04-26 MED ORDER — GLIPIZIDE ER 10 MG PO TB24: ORAL_TABLET | ORAL | Status: DC

## 2013-04-26 MED ORDER — AMLODIPINE BESYLATE 10 MG PO TABS: 10.0000 mg | ORAL_TABLET | Freq: Every day | ORAL | Status: DC

## 2013-04-26 NOTE — Addendum Note (Signed)
Addended by: Denman George B on: 04/26/2013 01:39 PM   Modules accepted: Orders

## 2013-04-26 NOTE — Addendum Note (Signed)
Addended by: Denman George B on: 04/26/2013 09:25 AM   Modules accepted: Orders

## 2013-04-30 ENCOUNTER — Ambulatory Visit (HOSPITAL_COMMUNITY)
Admission: RE | Admit: 2013-04-30 | Discharge: 2013-04-30 | Disposition: A | Discharge status: Home / Self Care | Payer: 59 | Source: Ambulatory Visit | Attending: Family Medicine | Admitting: Family Medicine

## 2013-04-30 DIAGNOSIS — N189 Chronic kidney disease, unspecified: Secondary | ICD-10-CM | POA: Insufficient documentation

## 2013-04-30 DIAGNOSIS — N179 Acute kidney failure, unspecified: Secondary | ICD-10-CM | POA: Insufficient documentation

## 2013-04-30 DIAGNOSIS — E119 Type 2 diabetes mellitus without complications: Secondary | ICD-10-CM | POA: Insufficient documentation

## 2013-04-30 DIAGNOSIS — N281 Cyst of kidney, acquired: Secondary | ICD-10-CM | POA: Insufficient documentation

## 2013-04-30 DIAGNOSIS — I129 Hypertensive chronic kidney disease with stage 1 through stage 4 chronic kidney disease, or unspecified chronic kidney disease: Secondary | ICD-10-CM | POA: Insufficient documentation

## 2013-05-08 ENCOUNTER — Other Ambulatory Visit: Payer: Self-pay | Admitting: Family Medicine

## 2013-05-09 LAB — COMPLETE METABOLIC PANEL WITH GFR
ALT: 10 U/L (ref 0–35)
AST: 11 U/L (ref 0–37)
Albumin: 3.8 g/dL (ref 3.5–5.2)
Alkaline Phosphatase: 68 U/L (ref 39–117)
BUN: 12 mg/dL (ref 6–23)
Calcium: 9.1 mg/dL (ref 8.4–10.5)
Chloride: 103 mEq/L (ref 96–112)
Creat: 0.83 mg/dL (ref 0.50–1.10)
Potassium: 4.3 mEq/L (ref 3.5–5.3)
Sodium: 140 mEq/L (ref 135–145)
Total Bilirubin: 0.5 mg/dL (ref 0.3–1.2)

## 2013-05-09 LAB — LIPID PANEL
LDL Cholesterol: 91 mg/dL (ref 0–99)
Total CHOL/HDL Ratio: 3.5 Ratio
Triglycerides: 92 mg/dL (ref <150)
VLDL: 18 mg/dL (ref 0–40)

## 2013-05-14 ENCOUNTER — Other Ambulatory Visit: Payer: Self-pay

## 2013-05-14 MED ORDER — INSULIN GLARGINE 100 UNIT/ML SOLOSTAR PEN: 70.0000 [IU] | PEN_INJECTOR | Freq: Every day | SUBCUTANEOUS | Status: DC

## 2013-05-24 ENCOUNTER — Ambulatory Visit: Payer: 59 | Admitting: Family Medicine

## 2013-06-01 ENCOUNTER — Telehealth: Payer: Self-pay | Admitting: Family Medicine

## 2013-06-01 NOTE — Telephone Encounter (Signed)
Noted  

## 2013-06-23 ENCOUNTER — Emergency Department (HOSPITAL_COMMUNITY)
Admission: EM | Admit: 2013-06-23 | Discharge: 2013-06-23 | Disposition: A | Discharge status: Home / Self Care | Payer: BC Managed Care – PPO | Attending: Emergency Medicine | Admitting: Emergency Medicine

## 2013-06-23 ENCOUNTER — Encounter (HOSPITAL_COMMUNITY): Payer: Self-pay | Admitting: Emergency Medicine

## 2013-06-23 ENCOUNTER — Emergency Department (HOSPITAL_COMMUNITY): Payer: BC Managed Care – PPO

## 2013-06-23 DIAGNOSIS — R112 Nausea with vomiting, unspecified: Secondary | ICD-10-CM | POA: Insufficient documentation

## 2013-06-23 DIAGNOSIS — E119 Type 2 diabetes mellitus without complications: Secondary | ICD-10-CM | POA: Insufficient documentation

## 2013-06-23 DIAGNOSIS — R42 Dizziness and giddiness: Secondary | ICD-10-CM | POA: Insufficient documentation

## 2013-06-23 DIAGNOSIS — Z7982 Long term (current) use of aspirin: Secondary | ICD-10-CM | POA: Insufficient documentation

## 2013-06-23 DIAGNOSIS — Z79899 Other long term (current) drug therapy: Secondary | ICD-10-CM | POA: Insufficient documentation

## 2013-06-23 DIAGNOSIS — Z862 Personal history of diseases of the blood and blood-forming organs and certain disorders involving the immune mechanism: Secondary | ICD-10-CM | POA: Insufficient documentation

## 2013-06-23 DIAGNOSIS — Z8719 Personal history of other diseases of the digestive system: Secondary | ICD-10-CM | POA: Insufficient documentation

## 2013-06-23 DIAGNOSIS — Z794 Long term (current) use of insulin: Secondary | ICD-10-CM | POA: Insufficient documentation

## 2013-06-23 DIAGNOSIS — I10 Essential (primary) hypertension: Secondary | ICD-10-CM | POA: Insufficient documentation

## 2013-06-23 DIAGNOSIS — E785 Hyperlipidemia, unspecified: Secondary | ICD-10-CM | POA: Insufficient documentation

## 2013-06-23 DIAGNOSIS — Z8659 Personal history of other mental and behavioral disorders: Secondary | ICD-10-CM | POA: Insufficient documentation

## 2013-06-23 DIAGNOSIS — E669 Obesity, unspecified: Secondary | ICD-10-CM | POA: Insufficient documentation

## 2013-06-23 LAB — CBC WITH DIFFERENTIAL/PLATELET
BASOS ABS: 0 10*3/uL (ref 0.0–0.1)
Basophils Relative: 0 % (ref 0–1)
EOS PCT: 2 % (ref 0–5)
Eosinophils Absolute: 0.1 10*3/uL (ref 0.0–0.7)
HCT: 38 % (ref 36.0–46.0)
Hemoglobin: 12.2 g/dL (ref 12.0–15.0)
Lymphocytes Relative: 23 % (ref 12–46)
Lymphs Abs: 1.4 10*3/uL (ref 0.7–4.0)
MCH: 26.6 pg (ref 26.0–34.0)
MCHC: 32.1 g/dL (ref 30.0–36.0)
MCV: 83 fL (ref 78.0–100.0)
MONO ABS: 0.4 10*3/uL (ref 0.1–1.0)
Monocytes Relative: 7 % (ref 3–12)
Neutro Abs: 4.2 10*3/uL (ref 1.7–7.7)
Neutrophils Relative %: 68 % (ref 43–77)
PLATELETS: 269 10*3/uL (ref 150–400)
RBC: 4.58 MIL/uL (ref 3.87–5.11)
RDW: 13.5 % (ref 11.5–15.5)
WBC: 6.1 10*3/uL (ref 4.0–10.5)

## 2013-06-23 LAB — BASIC METABOLIC PANEL
BUN: 12 mg/dL (ref 6–23)
CALCIUM: 9.8 mg/dL (ref 8.4–10.5)
CHLORIDE: 95 meq/L — AB (ref 96–112)
CO2: 28 mEq/L (ref 19–32)
CREATININE: 0.78 mg/dL (ref 0.50–1.10)
GFR calc Af Amer: >90 mL/min (ref >90)
GFR calc non Af Amer: 87 mL/min — ABNORMAL LOW (ref >90)
GLUCOSE: 214 mg/dL — AB (ref 70–99)
Potassium: 3.6 mEq/L — ABNORMAL LOW (ref 3.7–5.3)
Sodium: 135 mEq/L — ABNORMAL LOW (ref 137–147)

## 2013-06-23 LAB — GLUCOSE, CAPILLARY: Glucose-Capillary: 184 mg/dL — ABNORMAL HIGH (ref 70–99)

## 2013-06-23 MED ORDER — ONDANSETRON HCL 4 MG PO TABS: 4.0000 mg | ORAL_TABLET | Freq: Three times a day (TID) | ORAL | Status: DC | PRN

## 2013-06-23 MED ORDER — MECLIZINE HCL 12.5 MG PO TABS
25.0000 mg | ORAL_TABLET | Freq: Once | ORAL | Status: AC
  Administered 2013-06-23: 25 mg via ORAL
  Filled 2013-06-23: qty 2

## 2013-06-23 MED ORDER — ONDANSETRON 8 MG PO TBDP
8.0000 mg | ORAL_TABLET | Freq: Once | ORAL | Status: AC
  Administered 2013-06-23: 8 mg via ORAL
  Filled 2013-06-23: qty 1

## 2013-06-23 MED ORDER — MECLIZINE HCL 25 MG PO TABS: 25.0000 mg | ORAL_TABLET | Freq: Three times a day (TID) | ORAL | Status: DC | PRN

## 2013-06-23 NOTE — Discharge Instructions (Signed)

## 2013-06-23 NOTE — ED Notes (Signed)
Pt states dizziness began this morning which is worse when changing positions. States she vomited twice today also.

## 2013-06-23 NOTE — ED Provider Notes (Signed)
CSN: [id number]     Arrival date & time 06/23/13  1222 History   First MD Initiated Contact with Patient 06/23/13 1251     Chief Complaint  Patient presents with  . Dizziness     (Consider location/radiation/quality/duration/timing/severity/associated sxs/prior Treatment) HPI Comments: Kathleen [name] Kathleen [name] is a 63 y.o. Female presenting with dizziness when she first woke this morning,  Describing the room spinning with positional changes.  She went back to sleep and when she woke the symptom returned, but is currently improved.  She has had 2 episodes of nausea with vomiting also prior to arrival.  She denies fevers,chills, neck pain or stiffness and denies focal weakness or numbness, has had no headache,  Visual or hearing changes, also denies abdominal pain and diarrhea.  She has taken no medicine for this prior to arrival.  She denies a history of vertigo, reports had a uri including nasal congestion and sinus pain last week which has since resolved.     The history is provided by the patient and a relative.    Past Medical History  Diagnosis Date  . Diabetes mellitus type II   . GERD (gastroesophageal reflux disease)   . Hypertension 1995  . Low back pain   . Hyperlipidemia 2000  . Obesity   . Insomnia   . Dysphagia     unspecified   . Anemia    Past Surgical History  Procedure Laterality Date  . Cholecystectomy  2007  . Tubal ligation    . Tenotomy      2,3,4  left foot   . Carpal tunnel release      left   . Knee surgery  1.20.2012    arthroscopy LEFT knee partial medial meniscectomy  . Toenail removal    . Digit nail removal  08/2011   Family History  Problem Relation Age of Onset  . Diabetes Father   . Diabetes Mother   . Hypertension Mother     MI  . Diabetes Sister   . Diabetes Brother   . Diabetes Brother    History  Substance Use Topics  . Smoking status: Never Smoker   . Smokeless tobacco: Not on file  . Alcohol Use: No   OB History   Grav Para  Term Preterm Abortions TAB SAB Ect Mult Living                 Review of Systems  Constitutional: Negative for fever and chills.  HENT: Negative for congestion, ear discharge, ear pain, facial swelling, rhinorrhea and sore throat.   Eyes: Negative.   Respiratory: Negative for chest tightness and shortness of breath.   Cardiovascular: Negative for chest pain.  Gastrointestinal: Positive for nausea and vomiting. Negative for abdominal pain.  Genitourinary: Negative.   Musculoskeletal: Negative for arthralgias, joint swelling and neck pain.  Skin: Negative.  Negative for rash and wound.  Neurological: Positive for dizziness. Negative for seizures, speech difficulty, weakness, light-headedness, numbness and headaches.  Psychiatric/Behavioral: Negative.       Allergies  Review of patient's allergies indicates no known allergies.  Home Medications   Current Outpatient Rx  Name  Route  Sig  Dispense  Refill  . acetaminophen (TYLENOL ARTHRITIS PAIN) 650 MG CR tablet   Oral   Take 650 mg by mouth 3 (three) times daily as needed. 1-2 by mouth three times a day as needed pain          . amLODipine (NORVASC) 10 MG tablet  Oral   Take 10 mg by mouth daily.         Marland Kitchen aspirin (ASPIRIN LOW DOSE) 81 MG tablet   Oral   Take 81 mg by mouth at bedtime.          . Insulin Glargine (LANTUS) 100 UNIT/ML Solostar Pen   Subcutaneous   Inject 70 Units into the skin at bedtime. New directions         . atorvastatin (LIPITOR) 20 MG tablet   Oral   Take 1 tablet (20 mg total) by mouth daily.   30 tablet   5   . FREESTYLE LITE test strip      USE 2 TIMES A DAY AS DIRECTED.   50 each   5   . Insulin Pen Needle 31G X 8 MM MISC   Does not apply   by Does not apply route. Use daily with lancets            . Lancets (FREESTYLE) lancets   Other   1 each by Other route 3 (three) times daily. Use as directed three times a day          . meclizine (ANTIVERT) 25 MG tablet    Oral   Take 1 tablet (25 mg total) by mouth 3 (three) times daily as needed for dizziness.   30 tablet   0   . ondansetron (ZOFRAN) 4 MG tablet   Oral   Take 1 tablet (4 mg total) by mouth every 8 (eight) hours as needed for nausea or vomiting.   15 tablet   0    BP 158/77  Pulse 92  Temp(Src) 98.1 F (36.7 C) (Oral)  Resp 18  Ht 5\' 6"  (1.676 m)  Wt 228 lb (103.42 kg)  BMI 36.82 kg/m2  SpO2 95% Physical Exam  Nursing note and vitals reviewed. Constitutional: She is oriented to person, place, and time. She appears well-developed and well-nourished.  HENT:  Head: Normocephalic and atraumatic.  Right Ear: No mastoid tenderness. Tympanic membrane is not erythematous, not retracted and not bulging. No middle ear effusion.  Left Ear: No mastoid tenderness. Tympanic membrane is not erythematous, not retracted and not bulging.  No middle ear effusion.  Eyes: Conjunctivae are normal.  Neck: Normal range of motion.  Cardiovascular: Normal rate, regular rhythm, normal heart sounds and intact distal pulses.   Pulmonary/Chest: Effort normal and breath sounds normal. She has no wheezes.  Abdominal: Soft. Bowel sounds are normal. There is no tenderness.  Musculoskeletal: Normal range of motion.  Neurological: She is alert and oriented to person, place, and time. She has normal strength. She displays normal reflexes. No cranial nerve deficit or sensory deficit. She displays a negative Romberg sign. Coordination and gait normal.  Normal rapid alternating movements, negative pronator drift.  Subtle right sided mouth droop which patient and family state is baseline.  Skin: Skin is warm and dry.  Psychiatric: She has a normal mood and affect.    ED Course  Procedures (including critical care time) Labs Review Labs Reviewed  GLUCOSE, CAPILLARY - Abnormal; Notable for the following:    Glucose-Capillary 184 (*)    All other components within normal limits  BASIC METABOLIC PANEL - Abnormal;  Notable for the following:    Sodium 135 (*)    Potassium 3.6 (*)    Chloride 95 (*)    Glucose, Bld 214 (*)    GFR calc non Af Amer 87 (*)    All other  components within normal limits  CBC WITH DIFFERENTIAL   Imaging Review Ct Head Wo Contrast  06/23/2013   CLINICAL DATA:  Dizziness.  EXAM: CT HEAD WITHOUT CONTRAST  TECHNIQUE: Contiguous axial images were obtained from the base of the skull through the vertex without intravenous contrast.  COMPARISON:  None.  FINDINGS: Bony calvarium appears intact. No mass effect or midline shift is noted. Ventricular size is within normal limits. There is no evidence of mass lesion, hemorrhage or acute infarction.  IMPRESSION: No gross intracranial abnormality seen.   Electronically Signed   By: Sabino Dick M.D.   On: 06/23/2013 14:08    EKG Interpretation   None       MDM   Final diagnoses:  Vertigo    Patients labs and/or radiological studies were viewed and considered during the medical decision making and disposition process. Pt was also seen by Dr Lacinda Axon during this visit.  She was feeling better after receiving meclizine and zofran. No further dizziness.  Suspect peripheral vertigo,  Possibly triggered by her recent uri/nasal and ear congestion.  Advised recheck by pcp in 1 week,  Prescribed meclizine, zofran.  No evidence of intracranial process, Ct negative.  Pt's sx improved with meclizine.  The patient appears reasonably screened and/or stabilized for discharge and I doubt any other medical condition or other [facility] requiring further screening, evaluation, or treatment in the ED at this time prior to discharge.     Evalee Jefferson, PA-C 06/23/13 [phone]

## 2013-06-24 NOTE — ED Provider Notes (Signed)
Medical screening examination/treatment/procedure(s) were conducted as a shared visit with non-physician practitioner(s) and myself.  I personally evaluated the patient during the encounter.  EKG Interpretation   None      No neurological deficits.   CT head negative. Patient feels better after meclizine  Nat Christen, MD 06/24/13 1002

## 2013-07-11 ENCOUNTER — Other Ambulatory Visit (HOSPITAL_COMMUNITY): Payer: Self-pay | Admitting: Nephrology

## 2013-07-11 DIAGNOSIS — N289 Disorder of kidney and ureter, unspecified: Secondary | ICD-10-CM

## 2013-07-26 ENCOUNTER — Ambulatory Visit (HOSPITAL_COMMUNITY)
Admission: RE | Admit: 2013-07-26 | Discharge: 2013-07-26 | Disposition: A | Discharge status: Home / Self Care | Payer: BC Managed Care – PPO | Source: Ambulatory Visit | Attending: Nephrology | Admitting: Nephrology

## 2013-07-26 ENCOUNTER — Other Ambulatory Visit: Payer: Self-pay | Admitting: Family Medicine

## 2013-07-26 DIAGNOSIS — N289 Disorder of kidney and ureter, unspecified: Secondary | ICD-10-CM | POA: Insufficient documentation

## 2013-07-26 DIAGNOSIS — Q619 Cystic kidney disease, unspecified: Secondary | ICD-10-CM | POA: Insufficient documentation

## 2013-07-26 DIAGNOSIS — Z1231 Encounter for screening mammogram for malignant neoplasm of breast: Secondary | ICD-10-CM

## 2013-08-02 ENCOUNTER — Ambulatory Visit (HOSPITAL_COMMUNITY)
Admission: RE | Admit: 2013-08-02 | Discharge: 2013-08-02 | Disposition: A | Discharge status: Home / Self Care | Payer: BC Managed Care – PPO | Source: Ambulatory Visit | Attending: Family Medicine | Admitting: Family Medicine

## 2013-08-02 DIAGNOSIS — Z1231 Encounter for screening mammogram for malignant neoplasm of breast: Secondary | ICD-10-CM

## 2013-08-15 ENCOUNTER — Other Ambulatory Visit: Payer: Self-pay | Admitting: Family Medicine

## 2013-08-20 ENCOUNTER — Telehealth: Payer: Self-pay

## 2013-08-20 NOTE — Telephone Encounter (Signed)
Med refilled.

## 2013-08-29 ENCOUNTER — Ambulatory Visit: Payer: 59 | Admitting: Family Medicine

## 2013-09-17 ENCOUNTER — Encounter (INDEPENDENT_AMBULATORY_CARE_PROVIDER_SITE_OTHER): Payer: Self-pay

## 2013-09-17 ENCOUNTER — Encounter: Payer: Self-pay | Admitting: Family Medicine

## 2013-09-17 ENCOUNTER — Ambulatory Visit (INDEPENDENT_AMBULATORY_CARE_PROVIDER_SITE_OTHER): Payer: BC Managed Care – PPO | Admitting: Family Medicine

## 2013-09-17 VITALS — BP 160/90 | HR 95 | Resp 18 | Wt 225.0 lb

## 2013-09-17 DIAGNOSIS — J302 Other seasonal allergic rhinitis: Secondary | ICD-10-CM | POA: Insufficient documentation

## 2013-09-17 DIAGNOSIS — E1165 Type 2 diabetes mellitus with hyperglycemia: Principal | ICD-10-CM

## 2013-09-17 DIAGNOSIS — J309 Allergic rhinitis, unspecified: Secondary | ICD-10-CM

## 2013-09-17 DIAGNOSIS — E559 Vitamin D deficiency, unspecified: Secondary | ICD-10-CM

## 2013-09-17 DIAGNOSIS — I10 Essential (primary) hypertension: Secondary | ICD-10-CM

## 2013-09-17 DIAGNOSIS — E785 Hyperlipidemia, unspecified: Secondary | ICD-10-CM

## 2013-09-17 DIAGNOSIS — IMO0001 Reserved for inherently not codable concepts without codable children: Secondary | ICD-10-CM

## 2013-09-17 DIAGNOSIS — E669 Obesity, unspecified: Secondary | ICD-10-CM

## 2013-09-17 DIAGNOSIS — Z794 Long term (current) use of insulin: Principal | ICD-10-CM

## 2013-09-17 DIAGNOSIS — IMO0002 Reserved for concepts with insufficient information to code with codable children: Secondary | ICD-10-CM

## 2013-09-17 DIAGNOSIS — E1065 Type 1 diabetes mellitus with hyperglycemia: Secondary | ICD-10-CM

## 2013-09-17 MED ORDER — FLUTICASONE PROPIONATE 50 MCG/ACT NA SUSP: 2.0000 | Freq: Every day | NASAL | Status: DC

## 2013-09-17 NOTE — Patient Instructions (Addendum)
F/u in 72month call if you need me before  Microalb today from office  Fasting lipid cmp and EGFR and HBa1C and vit D today  Foot exam today is normal except for severe toenail fungal infection  It is good  That your kidneys are better   You need to take BP meds every day at the same time BP today is high since you missed morning tablet  Daily flonase sent in for allergies

## 2013-09-18 LAB — COMPLETE METABOLIC PANEL WITH GFR
ALT: 11 U/L (ref 0–35)
AST: 12 U/L (ref 0–37)
Albumin: 4.1 g/dL (ref 3.5–5.2)
Alkaline Phosphatase: 111 U/L (ref 39–117)
BILIRUBIN TOTAL: 0.6 mg/dL (ref 0.2–1.2)
BUN: 9 mg/dL (ref 6–23)
CO2: 29 meq/L (ref 19–32)
CREATININE: 0.78 mg/dL (ref 0.50–1.10)
Calcium: 9.3 mg/dL (ref 8.4–10.5)
Chloride: 96 mEq/L (ref 96–112)
GFR, EST NON AFRICAN AMERICAN: 81 mL/min
GLUCOSE: 337 mg/dL — AB (ref 70–99)
Potassium: 4.1 mEq/L (ref 3.5–5.3)
Sodium: 132 mEq/L — ABNORMAL LOW (ref 135–145)
Total Protein: 7.7 g/dL (ref 6.0–8.3)

## 2013-09-18 LAB — LIPID PANEL
Cholesterol: 180 mg/dL (ref 0–200)
HDL: 47 mg/dL (ref >39)
LDL CALC: 111 mg/dL — AB (ref 0–99)
TRIGLYCERIDES: 110 mg/dL (ref <150)
Total CHOL/HDL Ratio: 3.8 Ratio
VLDL: 22 mg/dL (ref 0–40)

## 2013-09-18 LAB — MICROALBUMIN / CREATININE URINE RATIO
Creatinine, Urine: 59.3 mg/dL
MICROALB UR: 1.29 mg/dL (ref 0.00–1.89)
Microalb Creat Ratio: 21.8 mg/g (ref 0.0–30.0)

## 2013-09-18 LAB — VITAMIN D 25 HYDROXY (VIT D DEFICIENCY, FRACTURES): Vit D, 25-Hydroxy: 25 ng/mL — ABNORMAL LOW (ref 30–89)

## 2013-09-18 LAB — HEMOGLOBIN A1C
Hgb A1c MFr Bld: 12.6 % — ABNORMAL HIGH (ref <5.7)
MEAN PLASMA GLUCOSE: 315 mg/dL — AB (ref <117)

## 2013-09-25 MED ORDER — INSULIN PEN NEEDLE 31G X 8 MM MISC: Status: DC

## 2013-09-27 MED ORDER — PRAVASTATIN SODIUM 40 MG PO TABS: 40.0000 mg | ORAL_TABLET | Freq: Every day | ORAL | Status: DC

## 2013-09-27 MED ORDER — METFORMIN HCL 1000 MG PO TABS: 1000.0000 mg | ORAL_TABLET | Freq: Two times a day (BID) | ORAL | Status: DC

## 2013-11-21 ENCOUNTER — Ambulatory Visit: Payer: BC Managed Care – PPO | Admitting: Family Medicine

## 2013-12-03 ENCOUNTER — Telehealth: Payer: Self-pay

## 2013-12-03 NOTE — Telephone Encounter (Signed)
Find out the cost of lantus , cost of levimir for pt, see iff she is in "donut hole or uninsured pls then we can move forward from there

## 2013-12-03 NOTE — Telephone Encounter (Signed)
Please advise 

## 2013-12-14 NOTE — Telephone Encounter (Signed)
Will look into options for patient assistance.

## 2013-12-25 ENCOUNTER — Ambulatory Visit: Payer: BC Managed Care – PPO | Admitting: Family Medicine

## 2013-12-26 ENCOUNTER — Ambulatory Visit (INDEPENDENT_AMBULATORY_CARE_PROVIDER_SITE_OTHER): Payer: BC Managed Care – PPO | Admitting: Family Medicine

## 2013-12-26 ENCOUNTER — Encounter: Payer: Self-pay | Admitting: Family Medicine

## 2013-12-26 VITALS — BP 152/80 | HR 91 | Resp 16 | Ht 66.0 in | Wt 226.1 lb

## 2013-12-26 DIAGNOSIS — E669 Obesity, unspecified: Secondary | ICD-10-CM

## 2013-12-26 DIAGNOSIS — E1165 Type 2 diabetes mellitus with hyperglycemia: Secondary | ICD-10-CM

## 2013-12-26 DIAGNOSIS — IMO0002 Reserved for concepts with insufficient information to code with codable children: Secondary | ICD-10-CM

## 2013-12-26 DIAGNOSIS — Z794 Long term (current) use of insulin: Secondary | ICD-10-CM

## 2013-12-26 DIAGNOSIS — E1065 Type 1 diabetes mellitus with hyperglycemia: Secondary | ICD-10-CM

## 2013-12-26 DIAGNOSIS — E785 Hyperlipidemia, unspecified: Secondary | ICD-10-CM

## 2013-12-26 DIAGNOSIS — IMO0001 Reserved for inherently not codable concepts without codable children: Secondary | ICD-10-CM

## 2013-12-26 DIAGNOSIS — I10 Essential (primary) hypertension: Secondary | ICD-10-CM

## 2013-12-26 DIAGNOSIS — Z23 Encounter for immunization: Secondary | ICD-10-CM | POA: Insufficient documentation

## 2013-12-26 LAB — COMPLETE METABOLIC PANEL WITH GFR
ALT: 9 U/L (ref 0–35)
AST: 10 U/L (ref 0–37)
Albumin: 3.8 g/dL (ref 3.5–5.2)
Alkaline Phosphatase: 91 U/L (ref 39–117)
BUN: 12 mg/dL (ref 6–23)
CO2: 30 mEq/L (ref 19–32)
Calcium: 9.6 mg/dL (ref 8.4–10.5)
Chloride: 101 mEq/L (ref 96–112)
Creat: 0.88 mg/dL (ref 0.50–1.10)
GFR, Est African American: 80 mL/min
GFR, Est Non African American: 70 mL/min
Glucose, Bld: 105 mg/dL — ABNORMAL HIGH (ref 70–99)
Potassium: 4.3 mEq/L (ref 3.5–5.3)
Sodium: 139 mEq/L (ref 135–145)
Total Bilirubin: 0.4 mg/dL (ref 0.2–1.2)
Total Protein: 7.6 g/dL (ref 6.0–8.3)

## 2013-12-26 LAB — HEMOGLOBIN A1C
Hgb A1c MFr Bld: 9.2 % — ABNORMAL HIGH (ref <5.7)
Mean Plasma Glucose: 217 mg/dL — ABNORMAL HIGH (ref <117)

## 2013-12-26 MED ORDER — SPIRONOLACTONE-HCTZ 25-25 MG PO TABS: 1.0000 | ORAL_TABLET | Freq: Every day | ORAL | Status: DC

## 2013-12-26 MED ORDER — CLONIDINE HCL 0.3 MG PO TABS: ORAL_TABLET | ORAL | Status: DC

## 2013-12-26 NOTE — Assessment & Plan Note (Signed)
Vaccine administered at visit.  

## 2013-12-26 NOTE — Assessment & Plan Note (Signed)
Uncontrolled with season change, start daily steroid nasal spray and saline nasal flushes

## 2013-12-26 NOTE — Assessment & Plan Note (Signed)
Out of insulkin for over 2 months, had no insurance coverage and was unable to afford med States that in the past approx 3 weeks she has had the insulin and now her fasting sugars are about 140 Patient advised to reduce carb and sweets, commit to regular physical activity, take meds as prescribed, test blood as directed, and attempt to lose weight, to improve blood sugar control.

## 2013-12-26 NOTE — Assessment & Plan Note (Signed)
Deteriorated, dose of lantus increased  Patient advised to reduce carb and sweets, commit to regular physical activity, take meds as prescribed, test blood as directed, and attempt to lose weight, to improve blood sugar control. Updated lab needed at/ before next visit.

## 2013-12-26 NOTE — Assessment & Plan Note (Signed)
Deteriorated. Patient re-educated about  the importance of commitment to a  minimum of 150 minutes of exercise per week. The importance of healthy food choices with portion control discussed. Encouraged to start a food diary, count calories and to consider  joining a support group. Sample diet sheets offered. Goals set by the patient for the next several months.    

## 2013-12-26 NOTE — Progress Notes (Signed)
   Subjective:    Patient ID: Kathleen [name], female    DOB: 1951-04-01, 63 y.o.   MRN: [id number]  HPI The PT is here for follow up and re-evaluation of chronic medical conditions, medication management and review of any available recent lab and radiology data.  Preventive health is updated, specifically  Cancer screening and Immunization.   The PT denies any adverse reactions to current medications since the last visit.  4 week h/o increased nasal congestion with clear nasal drainage, denies fever or chills, no sore throat or productive cough Blood sugars fluctuate and are uncontrolled, access to her medication is a challenge for financial reasons    Review of Systems See HPI Denies chest pains, palpitations and leg swelling Denies abdominal pain, nausea, vomiting,diarrhea or constipation.   Denies dysuria, frequency, hesitancy or incontinence. Denies joint pain, swelling and limitation in mobility. Denies headaches, seizures, numbness, or tingling. Denies depression, anxiety or insomnia. Denies skin break down or rash.        Objective:   Physical Exam BP 160/90  Pulse 95  Resp 18  Wt 225 lb (102.059 kg)  SpO2 97% Patient alert and oriented and in no cardiopulmonary distress.  HEENT: No facial asymmetry, EOMI,   oropharynx pink and moist.  Neck supple no JVD, no mass.  Chest: Clear to auscultation bilaterally.  CVS: S1, S2 no murmurs, no S3.Regular rate.  ABD: Soft non tender.   Ext: No edema  MS: Adequate ROM spine, shoulders, hips and knees.  Skin: Intact, no ulcerations or rash noted.  Psych: Good eye contact, normal affect. Memory intact not anxious or depressed appearing.  CNS: CN 2-12 intact, power,  normal throughout.no focal deficits noted.        Assessment & Plan:

## 2013-12-26 NOTE — Assessment & Plan Note (Signed)
Uncontrolled and pt has been off med for several months Now able to fill script once more Hyperlipidemia:Low fat diet discussed and encouraged.  Updated lab needed at/ before next visit.

## 2013-12-26 NOTE — Assessment & Plan Note (Signed)
Unchanged. Patient re-educated about  the importance of commitment to a  minimum of 150 minutes of exercise per week. The importance of healthy food choices with portion control discussed. Encouraged to start a food diary, count calories and to consider  joining a support group. Sample diet sheets offered. Goals set by the patient for the next several months.    

## 2013-12-26 NOTE — Progress Notes (Signed)
   Subjective:    Patient ID: Kathleen [name], female    DOB: 1951-03-04, 63 y.o.   MRN: [id number]  HPI The PT is here for follow up and re-evaluation of chronic medical conditions, medication management and review of any available recent lab and radiology data.  Preventive health is updated, specifically  Cancer screening and Immunization.    The PT denies any adverse reactions to current medications since the last visit. Unfortunately, she has been out of several medications over last several months due to lack of ins coverage , which she states is now fixed, she also had not been testing blood suagrs as she should, but they were as expected , high when checked There are no new concerns.  There are no specific complaints       Review of Systems See HPI Denies recent fever or chills. Denies sinus pressure, nasal congestion, ear pain or sore throat. Denies chest congestion, productive cough or wheezing. Denies chest pains, palpitations and leg swelling Denies abdominal pain, nausea, vomiting,diarrhea or constipation.   Denies dysuria, frequency, hesitancy or incontinence. Denies joint pain, swelling and limitation in mobility. Denies headaches, seizures, numbness, or tingling. Denies depression, anxiety or insomnia. Denies skin break down or rash.        Objective:   Physical Exam BP 152/80  Pulse 91  Resp 16  Ht 5\' 6"  (1.676 m)  Wt 226 lb 1.9 oz (102.567 kg)  BMI 36.51 kg/m2  SpO2 96% Patient alert and oriented and in no cardiopulmonary distress.  HEENT: No facial asymmetry, EOMI,   oropharynx pink and moist.  Neck supple no JVD, no mass.  Chest: Clear to auscultation bilaterally.  CVS: S1, S2 no murmurs, no S3.Regular rate.  ABD: Soft non tender.   Ext: No edema  MS: Adequate ROM spine, shoulders, hips and knees.  Skin: Intact, no ulcerations or rash noted.  Psych: Good eye contact, normal affect. Memory intact not anxious or depressed appearing.  CNS:  CN 2-12 intact, power,  normal throughout.no focal deficits noted.        Assessment & Plan:  HYPERTENSION Uncontrolled, additional medication started DASH diet and commitment to daily physical activity for a minimum of 30 minutes discussed and encouraged, as a part of hypertension management. The importance of attaining a healthy weight is also discussed.   Diabetes mellitus, insulin dependent (IDDM), uncontrolled Out of insulkin for over 2 months, had no insurance coverage and was unable to afford med States that in the past approx 3 weeks she has had the insulin and now her fasting sugars are about 140 Patient advised to reduce carb and sweets, commit to regular physical activity, take meds as prescribed, test blood as directed, and attempt to lose weight, to improve blood sugar control.   Need for vaccination with 13-polyvalent pneumococcal conjugate vaccine Vaccine administered at visit  HYPERLIPIDEMIA Uncontrolled and pt has been off med for several months Now able to fill script once more Hyperlipidemia:Low fat diet discussed and encouraged.  Updated lab needed at/ before next visit.   OBESITY Unchanged Patient re-educated about  the importance of commitment to a  minimum of 150 minutes of exercise per week. The importance of healthy food choices with portion control discussed. Encouraged to start a food diary, count calories and to consider  joining a support group. Sample diet sheets offered. Goals set by the patient for the next several months.

## 2013-12-26 NOTE — Assessment & Plan Note (Signed)
Uncontrolled, due to non compliance , misses med dose.Re educated re importance of med compliance for disease control DASH diet and commitment to daily physical activity for a minimum of 30 minutes discussed and encouraged, as a part of hypertension management. The importance of attaining a healthy weight is also discussed.

## 2013-12-26 NOTE — Assessment & Plan Note (Signed)
Uncontrolled, additional medication started DASH diet and commitment to daily physical activity for a minimum of 30 minutes discussed and encouraged, as a part of hypertension management. The importance of attaining a healthy weight is also discussed.

## 2013-12-26 NOTE — Patient Instructions (Addendum)
F/u with rectal in mid December, call if you need me before  Come in early October for flu vaccine  New additional medication for BP, , spironolactone/hCTZ take every morning at the same time   Prevnar today  HBa1C , chem 7 and EGFR today  Fasting lipid, cmp and EGFR, hBA1C in December 5 days before visit  It is important that you exercise regularly at least 30 minutes 5 times a week. If you develop chest pain, have severe difficulty breathing, or feel very tired, stop exercising immediately and seek medical attention   Sign up for diabetes class  Goal for fasting blood sugars is 90 to 140

## 2013-12-28 ENCOUNTER — Other Ambulatory Visit: Payer: Self-pay

## 2013-12-28 MED ORDER — GLUCOSE BLOOD VI STRP: ORAL_STRIP | Status: DC

## 2014-02-20 ENCOUNTER — Telehealth: Payer: Self-pay

## 2014-02-20 DIAGNOSIS — M67432 Ganglion, left wrist: Secondary | ICD-10-CM

## 2014-02-20 NOTE — Telephone Encounter (Signed)
If sounds like a ganglion cyst will need to see ortho, if sounds like an abcess give work in appt for tomorrow

## 2014-02-20 NOTE — Telephone Encounter (Signed)
Patient states that knot is painful and on the inside of wrist near thumb.  Has been there x 1 month but is getting worse. You can move the knot.  Please advise.

## 2014-02-22 NOTE — Telephone Encounter (Signed)
Referral entered  

## 2014-02-22 NOTE — Addendum Note (Signed)
Addended by: Denman George B on: 02/22/2014 11:21 AM   Modules accepted: Orders

## 2014-02-22 NOTE — Telephone Encounter (Signed)
Patient would like to see Dr. Aline Brochure.  Description is not that of an abscess.

## 2014-03-15 ENCOUNTER — Other Ambulatory Visit: Payer: Self-pay | Admitting: Family Medicine

## 2014-03-18 ENCOUNTER — Encounter: Payer: Self-pay | Admitting: Orthopedic Surgery

## 2014-03-18 ENCOUNTER — Ambulatory Visit (INDEPENDENT_AMBULATORY_CARE_PROVIDER_SITE_OTHER): Payer: BC Managed Care – PPO | Admitting: Orthopedic Surgery

## 2014-03-18 ENCOUNTER — Ambulatory Visit: Payer: BC Managed Care – PPO | Admitting: Orthopedic Surgery

## 2014-03-18 ENCOUNTER — Ambulatory Visit (INDEPENDENT_AMBULATORY_CARE_PROVIDER_SITE_OTHER): Payer: BC Managed Care – PPO

## 2014-03-18 VITALS — BP 129/69 | Ht 66.0 in | Wt 226.0 lb

## 2014-03-18 DIAGNOSIS — M25531 Pain in right wrist: Secondary | ICD-10-CM

## 2014-03-18 DIAGNOSIS — M654 Radial styloid tenosynovitis [de Quervain]: Secondary | ICD-10-CM

## 2014-03-18 MED ORDER — DICLOFENAC POTASSIUM 50 MG PO TABS: 50.0000 mg | ORAL_TABLET | Freq: Two times a day (BID) | ORAL | Status: DC

## 2014-03-18 NOTE — Progress Notes (Signed)
Patient ID: Kathleen [name], female   DOB: 14-Nov-1950, 63 y.o.   MRN: [id number] Chief Complaint  Patient presents with  . Wrist Pain    right wrist pain with cyst    Wrist Pain   63 year old female presents with new problem pain right wrist and thumb.  She complains mainly of aching pain over the radial side of the wrist which is 4 out of 10 and exacerbated by lifting heavy and small objects.  Review of systems She reports no fever or chills some wheezing back pain chronic denies skin rashes numbness or tingling or weakness  Medical history Past Medical History  Diagnosis Date  . Diabetes mellitus type II   . GERD (gastroesophageal reflux disease)   . Hypertension 1995  . Low back pain   . Hyperlipidemia 2000  . Obesity   . Insomnia   . Dysphagia     unspecified   . Anemia     BP 129/69 mmHg  Ht 5\' 6"  (1.676 m)  Wt 226 lb (102.513 kg)  BMI 36.49 kg/m2 Her appearance is normal she is oriented 3 her mood and affect are normal she is ambulatory without assistive devices  The right wrist and thumb are tender over the first extensor compartment she has painful ulnar deviation but normal range of motion of the wrist. The wrist joint is stable as is thumb. She has normal extension power normal flexion power of the MP joint and IP joint of the thumb. The skin shows no rash or laceration. Radial pulses normal and the capillary refill in the hand is normal. Normal sensation is noted in the wrist thumb and hand.  X-rays are negative for fracture dislocation or bony abnormality. That is my independent interpretation of the x-ray that I ordered  Impression de Quervain's syndrome right wrist  Plan Right no splint for 6 weeks NSAIDs Ice 3 times a week Follow-up 6 weeks

## 2014-03-18 NOTE — Patient Instructions (Addendum)
Splint x 6 weeks Ice 20 minutes 3 times daily Medication sent to your pharmacy

## 2014-04-17 ENCOUNTER — Telehealth: Payer: Self-pay

## 2014-04-23 ENCOUNTER — Ambulatory Visit (INDEPENDENT_AMBULATORY_CARE_PROVIDER_SITE_OTHER): Payer: Medicare Other

## 2014-04-23 ENCOUNTER — Ambulatory Visit (INDEPENDENT_AMBULATORY_CARE_PROVIDER_SITE_OTHER): Payer: BC Managed Care – PPO | Admitting: Family Medicine

## 2014-04-23 VITALS — BP 140/80 | HR 90 | Resp 16 | Ht 66.0 in | Wt 225.0 lb

## 2014-04-23 DIAGNOSIS — E785 Hyperlipidemia, unspecified: Secondary | ICD-10-CM

## 2014-04-23 DIAGNOSIS — Z23 Encounter for immunization: Secondary | ICD-10-CM

## 2014-04-23 DIAGNOSIS — E1165 Type 2 diabetes mellitus with hyperglycemia: Principal | ICD-10-CM

## 2014-04-23 DIAGNOSIS — IMO0001 Reserved for inherently not codable concepts without codable children: Secondary | ICD-10-CM

## 2014-04-23 DIAGNOSIS — E1065 Type 1 diabetes mellitus with hyperglycemia: Secondary | ICD-10-CM

## 2014-04-23 DIAGNOSIS — Z794 Long term (current) use of insulin: Principal | ICD-10-CM

## 2014-04-23 DIAGNOSIS — I10 Essential (primary) hypertension: Secondary | ICD-10-CM

## 2014-04-23 LAB — LIPID PANEL
Cholesterol: 191 mg/dL (ref 0–200)
HDL: 51 mg/dL (ref >39)
LDL Cholesterol: 113 mg/dL — ABNORMAL HIGH (ref 0–99)
Total CHOL/HDL Ratio: 3.7 Ratio
Triglycerides: 134 mg/dL (ref <150)
VLDL: 27 mg/dL (ref 0–40)

## 2014-04-23 LAB — COMPLETE METABOLIC PANEL WITH GFR
ALBUMIN: 4 g/dL (ref 3.5–5.2)
ALT: 9 U/L (ref 0–35)
AST: 12 U/L (ref 0–37)
Alkaline Phosphatase: 86 U/L (ref 39–117)
BUN: 9 mg/dL (ref 6–23)
CALCIUM: 10.2 mg/dL (ref 8.4–10.5)
CHLORIDE: 99 meq/L (ref 96–112)
CO2: 31 mEq/L (ref 19–32)
Creat: 0.81 mg/dL (ref 0.50–1.10)
GFR, Est African American: 89 mL/min
GFR, Est Non African American: 77 mL/min
GLUCOSE: 111 mg/dL — AB (ref 70–99)
POTASSIUM: 4.4 meq/L (ref 3.5–5.3)
SODIUM: 139 meq/L (ref 135–145)
TOTAL PROTEIN: 7.7 g/dL (ref 6.0–8.3)
Total Bilirubin: 0.5 mg/dL (ref 0.2–1.2)

## 2014-04-23 LAB — HEMOGLOBIN A1C
HEMOGLOBIN A1C: 10 % — AB (ref <5.7)
Mean Plasma Glucose: 240 mg/dL — ABNORMAL HIGH (ref <117)

## 2014-04-23 LAB — HM DIABETES EYE EXAM

## 2014-04-23 LAB — GLUCOSE, POCT (MANUAL RESULT ENTRY): POC Glucose: 131 mg/dl — AB (ref 70–99)

## 2014-04-23 NOTE — Assessment & Plan Note (Addendum)
Has stopped pravachol Updated lab needed at/ before next visit. Will calll when result is available Rewsult shows uncontrolled, needs to resume statin therapy, and lower fat intake

## 2014-04-23 NOTE — Assessment & Plan Note (Signed)
Vaccine administered at visit.  

## 2014-04-23 NOTE — Progress Notes (Signed)
   Subjective:    Patient ID: Kathleen [name], female    DOB: 1951/01/21, 62 y.o.   MRN: [id number]  HPI The Kathleen [name] is here for follow up and re-evaluation of chronic medical conditions, medication management and review of any available recent lab and radiology data.  Preventive health is updated, specifically  Cancer screening and Immunization.   Questions or concerns regarding consultations or procedures which the Kathleen [name] has had in the interim are  addressed. The Kathleen [name] denies any adverse reactions to current medications since the last visit.  There are no new concerns.  There are no specific complaints  Denies polyuria, polydipsia, blurred vision , or hypoglycemic episodes. States blood sugars are "good" when she does check them     Review of Systems See HPI Denies recent fever or chills. Denies sinus pressure, nasal congestion, ear pain or sore throat. Denies chest congestion, productive cough or wheezing. Denies chest pains, palpitations and leg swelling Denies abdominal pain, nausea, vomiting,diarrhea or constipation.   Denies dysuria, frequency, hesitancy or incontinence. Denies joint pain, swelling and limitation in mobility. Denies headaches, seizures, numbness, or tingling. Denies depression, anxiety or insomnia. Denies skin break down or rash.        Objective:   Physical Exam  BP 140/80 mmHg  Pulse 90  Resp 16  Ht 5\' 6"  (1.676 m)  Wt 225 lb (102.059 kg)  BMI 36.33 kg/m2  SpO2 96% Patient alert and oriented and in no cardiopulmonary distress.  HEENT: No facial asymmetry, EOMI,   oropharynx pink and moist.  Neck supple no JVD, no mass.  Chest: Clear to auscultation bilaterally.  CVS: S1, S2 no murmurs, no S3.Regular rate.  ABD: Soft non tender.   Ext: No edema  MS: Adequate ROM spine, shoulders, hips and knees.  Skin: Intact, no ulcerations or rash noted.  Psych: Good eye contact, normal affect. Memory intact not anxious or depressed appearing.  CNS: CN  2-12 intact, power,  normal throughout.no focal deficits noted.       Assessment & Plan:  Essential hypertension Sub optimal control, no med change DASH diet and commitment to daily physical activity for a minimum of 30 minutes discussed and encouraged, as a part of hypertension management. The importance of attaining a healthy weight is also discussed.   Diabetes mellitus, insulin dependent (IDDM), uncontrolled Updated lab needed at/ before next visit. Reports improved numbers, Denies polyuria, polydipsia, blurred vision , or hypoglycemic episodes. Will adjust med based on lab, unfortunately lab shows marked deterioration in blood sugar control, med adjustments made, class recommended and additional teaching done after the visit  Hyperlipidemia LDL goal <100 Has stopped pravachol Updated lab needed at/ before next visit. Will calll when result is available Rewsult shows uncontrolled, needs to resume statin therapy, and lower fat intake  Need for prophylactic vaccination and inoculation against influenza Vaccine administered at visit.   Obesity, Class II, BMI 35.0-39.9, with comorbidity (see actual BMI) Unchanged Patient re-educated about  the importance of commitment to a  minimum of 150 minutes of exercise per week. The importance of healthy food choices with portion control discussed. Encouraged to start a food diary, count calories and to consider  joining a support group. Sample diet sheets offered. Goals set by the patient for the next several months.

## 2014-04-23 NOTE — Assessment & Plan Note (Signed)
Sub optimal control, no med change DASH diet and commitment to daily physical activity for a minimum of 30 minutes discussed and encouraged, as a part of hypertension management. The importance of attaining a healthy weight is also discussed.

## 2014-04-23 NOTE — Assessment & Plan Note (Addendum)
Updated lab needed at/ before next visit. Reports improved numbers, Denies polyuria, polydipsia, blurred vision , or hypoglycemic episodes. Will adjust med based on lab, unfortunately lab shows marked deterioration in blood sugar control, med adjustments made, class recommended and additional teaching done after the visit

## 2014-04-23 NOTE — Patient Instructions (Signed)
Annual physical exam in 3.5 month, call if you need me before  We will contact you about labs tomorrow, you nEED to hear about your results  BP slightly high, reduce salt intake, work on weight loss and regular exercise  Fasting lipid, cmp and EGFr and HBa1C in 3.5 months 3 days BEFORE visit

## 2014-04-24 ENCOUNTER — Other Ambulatory Visit: Payer: Self-pay | Admitting: Family Medicine

## 2014-04-26 ENCOUNTER — Other Ambulatory Visit: Payer: Self-pay

## 2014-04-26 MED ORDER — PRAVASTATIN SODIUM 10 MG PO TABS: 10.0000 mg | ORAL_TABLET | Freq: Every day | ORAL | Status: DC

## 2014-04-26 MED ORDER — GLIPIZIDE ER 10 MG PO TB24: 10.0000 mg | ORAL_TABLET | Freq: Every day | ORAL | Status: DC

## 2014-04-29 ENCOUNTER — Ambulatory Visit (INDEPENDENT_AMBULATORY_CARE_PROVIDER_SITE_OTHER): Payer: Medicare Other | Admitting: Orthopedic Surgery

## 2014-04-29 ENCOUNTER — Encounter: Payer: Self-pay | Admitting: Orthopedic Surgery

## 2014-04-29 VITALS — BP 137/64 | Ht 66.0 in | Wt 225.0 lb

## 2014-04-29 DIAGNOSIS — M654 Radial styloid tenosynovitis [de Quervain]: Secondary | ICD-10-CM

## 2014-04-29 NOTE — Progress Notes (Signed)
Chief Complaint  Patient presents with  . Follow-up    6 week recheck right wrist s/p brace/medication    The patient was treated with brace and ice and anti-inflammatory medication. Her wrist is much improved. Her exam shows minimal tenderness  Recommend continue ice and bracing at night for the next 3 weeks no follow-up necessary unless pain worsens

## 2014-04-29 NOTE — Patient Instructions (Signed)
Continue ice and brace x 3 weeks at night

## 2014-05-05 ENCOUNTER — Encounter: Payer: Self-pay | Admitting: Family Medicine

## 2014-05-05 NOTE — Assessment & Plan Note (Signed)
Unchanged. Patient re-educated about  the importance of commitment to a  minimum of 150 minutes of exercise per week. The importance of healthy food choices with portion control discussed. Encouraged to start a food diary, count calories and to consider  joining a support group. Sample diet sheets offered. Goals set by the patient for the next several months.    

## 2014-05-09 ENCOUNTER — Other Ambulatory Visit: Payer: Self-pay | Admitting: Family Medicine

## 2014-05-18 ENCOUNTER — Other Ambulatory Visit: Payer: Self-pay | Admitting: Family Medicine

## 2014-05-24 ENCOUNTER — Telehealth: Payer: Self-pay

## 2014-05-24 NOTE — Telephone Encounter (Signed)
Update-  Called to check on her sugars and she said that she can't get her machine to work so she is going to come here Monday with it so I can check it.  Also she has not been able to get her lantus in awhile because with her new insurance plan she has to meet a $220 deductible and she doesn't have that money just yet. Just wanted to make you aware.

## 2014-05-24 NOTE — Telephone Encounter (Signed)
Do you think that health dept will be able to help short term, technically no ins coverage for needed med currently???

## 2014-06-03 ENCOUNTER — Other Ambulatory Visit: Payer: Self-pay | Admitting: Family Medicine

## 2014-06-14 NOTE — Telephone Encounter (Signed)
Patient was finally able to get her lantus but she couldn't give me any readings because her meter isn't working right. I told her to bring it by the office and I would try to fix it or give her a new one. She was supposed to bring it several weeks ago but she said something came up

## 2014-06-17 DIAGNOSIS — D649 Anemia, unspecified: Secondary | ICD-10-CM | POA: Diagnosis not present

## 2014-06-17 DIAGNOSIS — Z79899 Other long term (current) drug therapy: Secondary | ICD-10-CM | POA: Diagnosis not present

## 2014-06-17 DIAGNOSIS — N183 Chronic kidney disease, stage 3 (moderate): Secondary | ICD-10-CM | POA: Diagnosis not present

## 2014-06-17 DIAGNOSIS — E859 Amyloidosis, unspecified: Secondary | ICD-10-CM | POA: Diagnosis not present

## 2014-06-17 DIAGNOSIS — I10 Essential (primary) hypertension: Secondary | ICD-10-CM | POA: Diagnosis not present

## 2014-08-08 ENCOUNTER — Ambulatory Visit (INDEPENDENT_AMBULATORY_CARE_PROVIDER_SITE_OTHER): Payer: Medicare Other | Admitting: Family Medicine

## 2014-08-08 ENCOUNTER — Encounter: Payer: Self-pay | Admitting: Family Medicine

## 2014-08-08 ENCOUNTER — Other Ambulatory Visit (HOSPITAL_COMMUNITY)
Admission: RE | Admit: 2014-08-08 | Discharge: 2014-08-08 | Disposition: A | Discharge status: Home / Self Care | Payer: Medicare Other | Source: Ambulatory Visit | Attending: Family Medicine | Admitting: Family Medicine

## 2014-08-08 VITALS — BP 156/78 | HR 86 | Resp 18 | Ht 66.0 in | Wt 227.0 lb

## 2014-08-08 DIAGNOSIS — M179 Osteoarthritis of knee, unspecified: Secondary | ICD-10-CM

## 2014-08-08 DIAGNOSIS — Z13820 Encounter for screening for osteoporosis: Secondary | ICD-10-CM

## 2014-08-08 DIAGNOSIS — Z1211 Encounter for screening for malignant neoplasm of colon: Secondary | ICD-10-CM | POA: Diagnosis not present

## 2014-08-08 DIAGNOSIS — I10 Essential (primary) hypertension: Secondary | ICD-10-CM

## 2014-08-08 DIAGNOSIS — E785 Hyperlipidemia, unspecified: Secondary | ICD-10-CM | POA: Diagnosis not present

## 2014-08-08 DIAGNOSIS — E1065 Type 1 diabetes mellitus with hyperglycemia: Secondary | ICD-10-CM

## 2014-08-08 DIAGNOSIS — Z0000 Encounter for general adult medical examination without abnormal findings: Secondary | ICD-10-CM | POA: Diagnosis not present

## 2014-08-08 DIAGNOSIS — Z0001 Encounter for general adult medical examination with abnormal findings: Secondary | ICD-10-CM | POA: Insufficient documentation

## 2014-08-08 DIAGNOSIS — M5442 Lumbago with sciatica, left side: Secondary | ICD-10-CM

## 2014-08-08 DIAGNOSIS — Z124 Encounter for screening for malignant neoplasm of cervix: Secondary | ICD-10-CM | POA: Diagnosis not present

## 2014-08-08 DIAGNOSIS — Z794 Long term (current) use of insulin: Secondary | ICD-10-CM

## 2014-08-08 DIAGNOSIS — E1165 Type 2 diabetes mellitus with hyperglycemia: Secondary | ICD-10-CM

## 2014-08-08 DIAGNOSIS — M1711 Unilateral primary osteoarthritis, right knee: Secondary | ICD-10-CM

## 2014-08-08 DIAGNOSIS — M5441 Lumbago with sciatica, right side: Secondary | ICD-10-CM

## 2014-08-08 DIAGNOSIS — E109 Type 1 diabetes mellitus without complications: Secondary | ICD-10-CM | POA: Diagnosis not present

## 2014-08-08 DIAGNOSIS — IMO0001 Reserved for inherently not codable concepts without codable children: Secondary | ICD-10-CM

## 2014-08-08 LAB — CBC
HCT: 38.3 % (ref 36.0–46.0)
Hemoglobin: 12.2 g/dL (ref 12.0–15.0)
MCH: 26 pg (ref 26.0–34.0)
MCHC: 31.9 g/dL (ref 30.0–36.0)
MCV: 81.7 fL (ref 78.0–100.0)
MPV: 10.7 fL (ref 8.6–12.4)
Platelets: 309 10*3/uL (ref 150–400)
RBC: 4.69 MIL/uL (ref 3.87–5.11)
RDW: 14.3 % (ref 11.5–15.5)
WBC: 7.2 10*3/uL (ref 4.0–10.5)

## 2014-08-08 LAB — COMPLETE METABOLIC PANEL WITH GFR
ALBUMIN: 4.1 g/dL (ref 3.5–5.2)
ALK PHOS: 84 U/L (ref 39–117)
ALT: 14 U/L (ref 0–35)
AST: 14 U/L (ref 0–37)
BILIRUBIN TOTAL: 0.4 mg/dL (ref 0.2–1.2)
BUN: 11 mg/dL (ref 6–23)
CO2: 29 meq/L (ref 19–32)
Calcium: 9.6 mg/dL (ref 8.4–10.5)
Chloride: 99 mEq/L (ref 96–112)
Creat: 0.84 mg/dL (ref 0.50–1.10)
GFR, EST AFRICAN AMERICAN: 84 mL/min
GFR, EST NON AFRICAN AMERICAN: 73 mL/min
Glucose, Bld: 69 mg/dL — ABNORMAL LOW (ref 70–99)
Potassium: 3.9 mEq/L (ref 3.5–5.3)
SODIUM: 138 meq/L (ref 135–145)
TOTAL PROTEIN: 7.6 g/dL (ref 6.0–8.3)

## 2014-08-08 LAB — TSH: TSH: 1.095 u[IU]/mL (ref 0.350–4.500)

## 2014-08-08 LAB — LIPID PANEL
Cholesterol: 178 mg/dL (ref 0–200)
HDL: 43 mg/dL — AB (ref >=46)
LDL CALC: 111 mg/dL — AB (ref 0–99)
Total CHOL/HDL Ratio: 4.1 Ratio
Triglycerides: 118 mg/dL (ref <150)
VLDL: 24 mg/dL (ref 0–40)

## 2014-08-08 LAB — HEMOGLOBIN A1C
HEMOGLOBIN A1C: 7.9 % — AB (ref <5.7)
MEAN PLASMA GLUCOSE: 180 mg/dL — AB (ref <117)

## 2014-08-08 LAB — POC HEMOCCULT BLD/STL (OFFICE/1-CARD/DIAGNOSTIC): FECAL OCCULT BLD: NEGATIVE

## 2014-08-08 MED ORDER — POLYSACCHARIDE IRON COMPLEX 150 MG PO CAPS: 150.0000 mg | ORAL_CAPSULE | Freq: Every day | ORAL | Status: DC

## 2014-08-08 MED ORDER — RAMIPRIL 10 MG PO CAPS: 10.0000 mg | ORAL_CAPSULE | Freq: Every day | ORAL | Status: DC

## 2014-08-08 MED ORDER — SPIRONOLACTONE-HCTZ 25-25 MG PO TABS
1.0000 | ORAL_TABLET | Freq: Every day | ORAL | Status: DC
Start: 2014-08-08 — End: 2015-02-10

## 2014-08-08 MED ORDER — PRAVASTATIN SODIUM 10 MG PO TABS: 10.0000 mg | ORAL_TABLET | Freq: Every day | ORAL | Status: DC

## 2014-08-08 MED ORDER — CLONIDINE HCL 0.3 MG PO TABS: ORAL_TABLET | ORAL | Status: DC

## 2014-08-08 NOTE — Assessment & Plan Note (Signed)

## 2014-08-08 NOTE — Patient Instructions (Signed)
F/u in 4 weeks ,c all if you need me before  Labs today  Chem 7 and EGFR NON fAST in 2 weeks   New additional med for blood pressure , ramipril, 10 mg one daily, continue all your current medications  Nurse will ensure you know how to test your blood sugar before you leave, need to test at least twice daily  Goal for fasting blood sugar ranges from 80 to 120 and 2 hours after any meal or at bedtime should be between 130 to 170.  

## 2014-08-10 NOTE — Assessment & Plan Note (Signed)
Uncontrolled , additional medication started, with 4 week follow up [planned DASH diet and commitment to daily physical activity for a minimum of 30 minutes discussed and encouraged, as a part of hypertension management. The importance of attaining a healthy weight is also discussed.  BP/Weight 08/08/2014 04/29/2014 04/23/2014 03/18/2014 12/26/2013 09/17/2013 [id number]  Systolic BP A999333 0000000 [id number] Q000111Q 0000000 0000000 0000000  Diastolic BP 78 64 80 69 80 90 77  Wt. (Lbs) 227 225 225 226 226.12 225 228  BMI 36.66 36.33 36.33 36.49 36.51 36.33 36.82    CMP Latest Ref Rng 08/08/2014 04/23/2014 12/26/2013  Glucose 70 - 99 mg/dL 69(L) 111(H) 105(H)  BUN 6 - 23 mg/dL 11 9 12   Creatinine 0.50 - 1.10 mg/dL 0.84 0.81 0.88  Sodium 135 - 145 mEq/L 138 139 139  Potassium 3.5 - 5.3 mEq/L 3.9 4.4 4.3  Chloride 96 - 112 mEq/L 99 99 101  CO2 19 - 32 mEq/L 29 31 30   Calcium 8.4 - 10.5 mg/dL 9.6 10.2 9.6  Total Protein 6.0 - 8.3 g/dL 7.6 7.7 7.6  Total Bilirubin 0.2 - 1.2 mg/dL 0.4 0.5 0.4  Alkaline Phos 39 - 117 U/L 84 86 91  AST 0 - 37 U/L 14 12 10   ALT 0 - 35 U/L 14 9 9

## 2014-08-10 NOTE — Progress Notes (Signed)
Subjective:    Patient ID: Kathleen [name], female    DOB: June 06, 1950, 64 y.o.   MRN: [id number]  HPI Patient is in for annual physical exam. Blood pressure is uncontrolled and medication adjustment is made.   Review of Systems See HPI [id number] chest pain, palpitations, PND, orthopnea or leg swelling    Objective:   Physical Exam BP 156/78 mmHg  Pulse 86  Resp 18  Ht 5\' 6"  (1.676 m)  Wt 227 lb (102.967 kg)  BMI 36.66 kg/m2  SpO2 96% Pleasant well nourished female, alert and oriented x 3, in no cardio-pulmonary distress. Afebrile. HEENT No facial trauma or asymetry. Sinuses non tender.  Extra occullar muscles intact, pupils equally reactive to light. External ears normal, tympanic membranes clear. Oropharynx moist, no exudate, poor dentition. Neck: supple, no adenopathy,JVD or thyromegaly.No bruits.  Chest: Clear to ascultation bilaterally.No crackles or wheezes. Non tender to palpation  Breast: No asymetry,no masses or lumps. No tenderness. No nipple discharge or inversion. No axillary or supraclavicular adenopathy  Cardiovascular system; Heart sounds normal,  S1 and  S2 ,no S3.  No murmur, or thrill. Apical beat not displaced Peripheral pulses normal.  Abdomen: Soft, non tender, no organomegaly or masses. No bruits. Bowel sounds normal. No guarding, tenderness or rebound.  Rectal:  Normal sphincter tone. No mass.No rectal masses.  Guaiac negative stool.  GU: External genitalia normal female genitalia , female distribution of hair. No lesions. Urethral meatus normal in size, no  Prolapse, no lesions visibly  Present. Bladder non tender. Vagina pink and moist , with no visible lesions , discharge present . Adequate pelvic support no  cystocele or rectocele noted Cervix pink and appears healthy, no lesions or ulcerations noted, no discharge noted from os Uterus normal size, no adnexal masses, no cervical motion or adnexal  tenderness.   Musculoskeletal exam: Full ROM of spine, hips , shoulders and right  Knee, left knee deformed with tenderness and crepitus and reduced ROM. Reduced ROM wrist with swelling and tenderness  No muscle wasting or atrophy.   Neurologic: Cranial nerves 2 to 12 intact. Power, tone ,sensation and reflexes normal throughout. No disturbance in gait. No tremor.  Skin: Intact, no ulceration, erythema , scaling or rash noted. Pigmentation normal throughout  Psych; Normal mood and affect. Judgement and concentration normal        Assessment & Plan:  Annual physical exam Annual exam as documented. Counseling done  re healthy lifestyle involving commitment to 150 minutes exercise per week, heart healthy diet, and attaining healthy weight.The importance of adequate sleep also discussed. Regular seat belt use and home safety, is also discussed. Changes in health habits are decided on by the patient with goals and time frames  set for achieving them. Immunization and cancer screening needs are specifically addressed at this visit.    Essential hypertension Uncontrolled , additional medication started, with 4 week follow up [planned DASH diet and commitment to daily physical activity for a minimum of 30 minutes discussed and encouraged, as a part of hypertension management. The importance of attaining a healthy weight is also discussed.  BP/Weight 08/08/2014 04/29/2014 04/23/2014 03/18/2014 12/26/2013 09/17/2013 123456  Systolic BP A999333 0000000 [id number] Q000111Q 0000000 0000000 0000000  Diastolic BP 78 64 80 69 80 90 77  Wt. (Lbs) 227 225 225 226 226.12 225 228  BMI 36.66 36.33 36.33 36.49 36.51 36.33 36.82    CMP Latest Ref Rng 08/08/2014 04/23/2014 12/26/2013  Glucose 70 - 99 mg/dL 69(L) 111(H) 105(H)  BUN 6 - 23 mg/dL 11 9 12   Creatinine 0.50 - 1.10 mg/dL 0.84 0.81 0.88  Sodium 135 - 145 mEq/L 138 139 139  Potassium 3.5 - 5.3 mEq/L 3.9 4.4 4.3  Chloride 96 - 112 mEq/L 99 99 101  CO2 19 - 32  mEq/L 29 31 30   Calcium 8.4 - 10.5 mg/dL 9.6 10.2 9.6  Total Protein 6.0 - 8.3 g/dL 7.6 7.7 7.6  Total Bilirubin 0.2 - 1.2 mg/dL 0.4 0.5 0.4  Alkaline Phos 39 - 117 U/L 84 86 91  AST 0 - 37 U/L 14 12 10   ALT 0 - 35 U/L 14 9 9

## 2014-08-12 LAB — CYTOLOGY - PAP

## 2014-08-12 MED ORDER — PRAVASTATIN SODIUM 20 MG PO TABS: 20.0000 mg | ORAL_TABLET | Freq: Every day | ORAL | Status: DC

## 2014-08-12 MED ORDER — INSULIN GLARGINE 100 UNIT/ML SOLOSTAR PEN: PEN_INJECTOR | SUBCUTANEOUS | Status: DC

## 2014-08-12 NOTE — Addendum Note (Signed)
Addended by: Denman George B on: 08/12/2014 10:28 AM   Modules accepted: Orders, Medications

## 2014-08-14 ENCOUNTER — Other Ambulatory Visit (HOSPITAL_COMMUNITY): Payer: Medicare Other

## 2014-08-19 ENCOUNTER — Ambulatory Visit (HOSPITAL_COMMUNITY)
Admission: RE | Admit: 2014-08-19 | Discharge: 2014-08-19 | Disposition: A | Discharge status: Home / Self Care | Payer: Medicare Other | Source: Ambulatory Visit | Attending: Family Medicine | Admitting: Family Medicine

## 2014-08-19 DIAGNOSIS — M179 Osteoarthritis of knee, unspecified: Secondary | ICD-10-CM | POA: Insufficient documentation

## 2014-08-19 DIAGNOSIS — Z13820 Encounter for screening for osteoporosis: Secondary | ICD-10-CM

## 2014-08-19 DIAGNOSIS — M5441 Lumbago with sciatica, right side: Secondary | ICD-10-CM | POA: Diagnosis not present

## 2014-08-19 DIAGNOSIS — M5442 Lumbago with sciatica, left side: Secondary | ICD-10-CM | POA: Diagnosis not present

## 2014-08-19 DIAGNOSIS — Z780 Asymptomatic menopausal state: Secondary | ICD-10-CM | POA: Diagnosis not present

## 2014-08-19 DIAGNOSIS — M1711 Unilateral primary osteoarthritis, right knee: Secondary | ICD-10-CM

## 2014-09-04 ENCOUNTER — Ambulatory Visit (INDEPENDENT_AMBULATORY_CARE_PROVIDER_SITE_OTHER): Payer: Medicare Other | Admitting: Family Medicine

## 2014-09-04 ENCOUNTER — Encounter: Payer: Self-pay | Admitting: Family Medicine

## 2014-09-04 VITALS — BP 132/80 | HR 86 | Resp 18 | Ht 66.0 in | Wt 228.1 lb

## 2014-09-04 DIAGNOSIS — Z794 Long term (current) use of insulin: Secondary | ICD-10-CM

## 2014-09-04 DIAGNOSIS — I10 Essential (primary) hypertension: Secondary | ICD-10-CM

## 2014-09-04 DIAGNOSIS — E1065 Type 1 diabetes mellitus with hyperglycemia: Secondary | ICD-10-CM

## 2014-09-04 DIAGNOSIS — E785 Hyperlipidemia, unspecified: Secondary | ICD-10-CM | POA: Diagnosis not present

## 2014-09-04 DIAGNOSIS — IMO0001 Reserved for inherently not codable concepts without codable children: Secondary | ICD-10-CM

## 2014-09-04 DIAGNOSIS — E109 Type 1 diabetes mellitus without complications: Secondary | ICD-10-CM | POA: Diagnosis not present

## 2014-09-04 DIAGNOSIS — E1165 Type 2 diabetes mellitus with hyperglycemia: Secondary | ICD-10-CM

## 2014-09-04 NOTE — Assessment & Plan Note (Signed)
Controlled, no change in medication DASH diet and commitment to daily physical activity for a minimum of 30 minutes discussed and encouraged, as a part of hypertension management. The importance of attaining a healthy weight is also discussed.  BP/Weight 09/04/2014 08/08/2014 04/29/2014 04/23/2014 03/18/2014 12/26/2013 Q000111Q  Systolic BP Q000111Q A999333 0000000 [id number] Q000111Q 0000000 0000000  Diastolic BP 80 78 64 80 69 80 90  Wt. (Lbs) 228.08 227 225 225 226 226.12 225  BMI 36.83 36.66 36.33 36.33 36.49 36.51 36.33

## 2014-09-04 NOTE — Assessment & Plan Note (Addendum)
  Improved Patient educated about the importance of limiting  Carbohydrate intake , the need to commit to daily physical activity for a minimum of 30 minutes , and to commit weight loss. The fact that changes in all these areas will rimprove diabetes is stressed.   Diabetic Labs Latest Ref Rng 08/08/2014 04/23/2014 12/26/2013 09/17/2013 06/23/2013  HbA1c <5.7 % 7.9(H) 10.0(H) 9.2(H) 12.6(H) -  Microalbumin 0.00 - 1.89 mg/dL - - - 1.29 -  Micro/Creat Ratio 0.0 - 30.0 mg/g - - - 21.8 -  Chol 0 - 200 mg/dL 178 191 - 180 -  HDL >=46 mg/dL 43(L) 51 - 47 -  Calc LDL 0 - 99 mg/dL 111(H) 113(H) - 111(H) -  Triglycerides <150 mg/dL 118 134 - 110 -  Creatinine 0.50 - 1.10 mg/dL 0.84 0.81 0.88 0.78 0.78   BP/Weight 09/04/2014 08/08/2014 04/29/2014 04/23/2014 03/18/2014 12/26/2013 Q000111Q  Systolic BP Q000111Q A999333 0000000 [id number] Q000111Q 0000000 0000000  Diastolic BP 80 78 64 80 69 80 90  Wt. (Lbs) 228.08 227 225 225 226 226.12 225  BMI 36.83 36.66 36.33 36.33 36.49 36.51 36.33   Foot/eye exam completion dates 09/17/2013 08/02/2012  Foot Form Completion Done Done   Blood suagr fasting log shows 80 to 115

## 2014-09-04 NOTE — Patient Instructions (Addendum)
Initial annual wellness  2nd week in August, call if you need me befor  Excellent blood pressure  Thanks for bringing medication and blood sugar log which looks great  Fasting lipid, cmp and EGFR, HBA1C August 1 or after but 5 to 7 days before appt pls  Thanks for choosing [facility], we consider it a privelige to serve you.

## 2014-09-05 LAB — BASIC METABOLIC PANEL WITH GFR
BUN: 17 mg/dL (ref 6–23)
CALCIUM: 9.7 mg/dL (ref 8.4–10.5)
CO2: 29 mEq/L (ref 19–32)
Chloride: 101 mEq/L (ref 96–112)
Creat: 0.84 mg/dL (ref 0.50–1.10)
GFR, EST AFRICAN AMERICAN: 84 mL/min
GFR, Est Non African American: 73 mL/min
GLUCOSE: 83 mg/dL (ref 70–99)
POTASSIUM: 4.3 meq/L (ref 3.5–5.3)
Sodium: 138 mEq/L (ref 135–145)

## 2014-09-15 NOTE — Assessment & Plan Note (Signed)
Hyperlipidemia:Low fat diet discussed and encouraged. Uncontrolled currently, no mmed change, dietary change encouraged  Lipid Panel  Lab Results  Component Value Date   CHOL 178 08/08/2014   HDL 43* 08/08/2014   LDLCALC 111* 08/08/2014   TRIG 118 08/08/2014   CHOLHDL 4.1 08/08/2014

## 2014-09-15 NOTE — Assessment & Plan Note (Signed)
Unchanged Patient re-educated about  the importance of commitment to a  minimum of 150 minutes of exercise per week.  The importance of healthy food choices with portion control discussed. Encouraged to start a food diary, count calories and to consider  joining a support group. Sample diet sheets offered. Goals set by the patient for the next several months.   Weight /BMI 09/04/2014 08/08/2014 04/29/2014  WEIGHT 228 lb 1.3 oz 227 lb 225 lb  HEIGHT 5\' 6"  5\' 6"  5\' 6"   BMI 36.83 kg/m2 36.66 kg/m2 36.33 kg/m2    Current exercise per week 90 minutes.

## 2014-09-15 NOTE — Progress Notes (Signed)
Kathleen [name]     MRN: [id number]      DOB: Apr 23, 1951   HPI Kathleen [name] is here for follow up and re-evaluation of chronic medical conditions, medication management and review of any available recent lab and radiology data.  Preventive health is updated, specifically  Cancer screening and Immunization.   Questions or concerns regarding consultations or procedures which the PT has had in the interim are  addressed. The PT denies any adverse reactions to current medications since the last visit.  There are no new concerns.  There are no specific  Denies polyuria, polydipsia, blurred vision , or hypoglycemic episodes. Blood sugar log reviewed and blood sugar values aree within target range  ROS Denies recent fever or chills. Denies sinus pressure, nasal congestion, ear pain or sore throat. Denies chest congestion, productive cough or wheezing. Denies chest pains, palpitations and leg swelling Denies abdominal pain, nausea, vomiting,diarrhea or constipation.   Denies dysuria, frequency, hesitancy or incontinence. Denies joint pain, swelling and limitation in mobility. Denies headaches, seizures, numbness, or tingling. Denies depression, anxiety or insomnia. Denies skin break down or rash.   PE  BP 132/80 mmHg  Pulse 86  Resp 18  Ht 5\' 6"  (1.676 m)  Wt 228 lb 1.3 oz (103.456 kg)  BMI 36.83 kg/m2  SpO2 97%  Patient alert and oriented and in no cardiopulmonary distress.  HEENT: No facial asymmetry, EOMI,   oropharynx pink and moist.  Neck supple no JVD, no mass.  Chest: Clear to auscultation bilaterally.  CVS: S1, S2 no murmurs, no S3.Regular rate.  ABD: Soft non tender.   Ext: No edema  MS: Adequate ROM spine, shoulders, hips and knees.  Skin: Intact, no ulcerations or rash noted.  Psych: Good eye contact, normal affect. Memory intact not anxious or depressed appearing.  CNS: CN 2-12 intact, power,  normal throughout.no focal deficits noted.   Assessment &  Plan   Essential hypertension Controlled, no change in medication DASH diet and commitment to daily physical activity for a minimum of 30 minutes discussed and encouraged, as a part of hypertension management. The importance of attaining a healthy weight is also discussed.  BP/Weight 09/04/2014 08/08/2014 04/29/2014 04/23/2014 03/18/2014 12/26/2013 Q000111Q  Systolic BP Q000111Q A999333 0000000 [id number] Q000111Q 0000000 0000000  Diastolic BP 80 78 64 80 69 80 90  Wt. (Lbs) 228.08 227 225 225 226 226.12 225  BMI 36.83 36.66 36.33 36.33 36.49 36.51 36.33         Diabetes mellitus, insulin dependent (IDDM), uncontrolled  Improved Patient educated about the importance of limiting  Carbohydrate intake , the need to commit to daily physical activity for a minimum of 30 minutes , and to commit weight loss. The fact that changes in all these areas will rimprove diabetes is stressed.   Diabetic Labs Latest Ref Rng 08/08/2014 04/23/2014 12/26/2013 09/17/2013 06/23/2013  HbA1c <5.7 % 7.9(H) 10.0(H) 9.2(H) 12.6(H) -  Microalbumin 0.00 - 1.89 mg/dL - - - 1.29 -  Micro/Creat Ratio 0.0 - 30.0 mg/g - - - 21.8 -  Chol 0 - 200 mg/dL 178 191 - 180 -  HDL >=46 mg/dL 43(L) 51 - 47 -  Calc LDL 0 - 99 mg/dL 111(H) 113(H) - 111(H) -  Triglycerides <150 mg/dL 118 134 - 110 -  Creatinine 0.50 - 1.10 mg/dL 0.84 0.81 0.88 0.78 0.78   BP/Weight 09/04/2014 08/08/2014 04/29/2014 04/23/2014 03/18/2014 12/26/2013 Q000111Q  Systolic BP Q000111Q A999333 0000000 [id number] Q000111Q 0000000 0000000  Diastolic BP 80  78 64 80 69 80 90  Wt. (Lbs) 228.08 227 225 225 226 226.12 225  BMI 36.83 36.66 36.33 36.33 36.49 36.51 36.33   Foot/eye exam completion dates 09/17/2013 08/02/2012  Foot Form Completion Done Done   Blood suagr fasting log shows 80 to 115     Hyperlipidemia LDL goal <100 Hyperlipidemia:Low fat diet discussed and encouraged. Uncontrolled currently, no mmed change, dietary change encouraged  Lipid Panel  Lab Results  Component Value Date   CHOL 178 08/08/2014    HDL 43* 08/08/2014   LDLCALC 111* 08/08/2014   TRIG 118 08/08/2014   CHOLHDL 4.1 08/08/2014         Obesity, Class II, BMI 35.0-39.9, with comorbidity (see actual BMI) Unchanged Patient re-educated about  the importance of commitment to a  minimum of 150 minutes of exercise per week.  The importance of healthy food choices with portion control discussed. Encouraged to start a food diary, count calories and to consider  joining a support group. Sample diet sheets offered. Goals set by the patient for the next several months.   Weight /BMI 09/04/2014 08/08/2014 04/29/2014  WEIGHT 228 lb 1.3 oz 227 lb 225 lb  HEIGHT 5\' 6"  5\' 6"  5\' 6"   BMI 36.83 kg/m2 36.66 kg/m2 36.33 kg/m2    Current exercise per week 90 minutes.

## 2014-10-02 ENCOUNTER — Other Ambulatory Visit: Payer: Self-pay | Admitting: Family Medicine

## 2014-10-04 ENCOUNTER — Other Ambulatory Visit: Payer: Self-pay | Admitting: Family Medicine

## 2014-10-04 DIAGNOSIS — Z1231 Encounter for screening mammogram for malignant neoplasm of breast: Secondary | ICD-10-CM

## 2014-10-11 ENCOUNTER — Other Ambulatory Visit: Payer: Self-pay

## 2014-10-18 ENCOUNTER — Other Ambulatory Visit: Payer: Self-pay

## 2014-10-18 MED ORDER — INSULIN GLARGINE 100 UNIT/ML SOLOSTAR PEN
PEN_INJECTOR | SUBCUTANEOUS | Status: DC
Start: 2014-10-18 — End: 2015-04-21

## 2014-11-08 ENCOUNTER — Other Ambulatory Visit: Payer: Self-pay | Admitting: Family Medicine

## 2014-11-08 ENCOUNTER — Ambulatory Visit (HOSPITAL_COMMUNITY)
Admission: RE | Admit: 2014-11-08 | Discharge: 2014-11-08 | Disposition: A | Discharge status: Home / Self Care | Payer: Medicare Other | Source: Ambulatory Visit | Attending: Family Medicine | Admitting: Family Medicine

## 2014-11-08 DIAGNOSIS — Z1231 Encounter for screening mammogram for malignant neoplasm of breast: Secondary | ICD-10-CM | POA: Diagnosis not present

## 2014-11-27 ENCOUNTER — Other Ambulatory Visit: Payer: Self-pay | Admitting: Family Medicine

## 2014-12-16 ENCOUNTER — Other Ambulatory Visit: Payer: Self-pay | Admitting: Family Medicine

## 2014-12-16 DIAGNOSIS — E109 Type 1 diabetes mellitus without complications: Secondary | ICD-10-CM | POA: Diagnosis not present

## 2014-12-16 DIAGNOSIS — E1065 Type 1 diabetes mellitus with hyperglycemia: Secondary | ICD-10-CM | POA: Diagnosis not present

## 2014-12-16 DIAGNOSIS — Z114 Encounter for screening for human immunodeficiency virus [HIV]: Secondary | ICD-10-CM | POA: Diagnosis not present

## 2014-12-16 DIAGNOSIS — Z1159 Encounter for screening for other viral diseases: Secondary | ICD-10-CM | POA: Diagnosis not present

## 2014-12-16 DIAGNOSIS — E785 Hyperlipidemia, unspecified: Secondary | ICD-10-CM | POA: Diagnosis not present

## 2014-12-17 LAB — HEMOGLOBIN A1C
Hgb A1c MFr Bld: 8.5 % — ABNORMAL HIGH (ref <5.7)
MEAN PLASMA GLUCOSE: 197 mg/dL — AB (ref <117)

## 2014-12-17 LAB — COMPLETE METABOLIC PANEL WITH GFR
ALK PHOS: 83 U/L (ref 33–130)
ALT: 11 U/L (ref 6–29)
AST: 11 U/L (ref 10–35)
Albumin: 3.9 g/dL (ref 3.6–5.1)
BILIRUBIN TOTAL: 0.5 mg/dL (ref 0.2–1.2)
BUN: 13 mg/dL (ref 7–25)
CO2: 28 mmol/L (ref 20–31)
CREATININE: 0.96 mg/dL (ref 0.50–0.99)
Calcium: 9.3 mg/dL (ref 8.6–10.4)
Chloride: 100 mmol/L (ref 98–110)
GFR, EST AFRICAN AMERICAN: 72 mL/min (ref >=60)
GFR, EST NON AFRICAN AMERICAN: 62 mL/min (ref >=60)
Glucose, Bld: 112 mg/dL — ABNORMAL HIGH (ref 65–99)
POTASSIUM: 4.4 mmol/L (ref 3.5–5.3)
Sodium: 137 mmol/L (ref 135–146)
Total Protein: 7.4 g/dL (ref 6.1–8.1)

## 2014-12-17 LAB — LIPID PANEL
CHOLESTEROL: 174 mg/dL (ref 125–200)
HDL: 48 mg/dL (ref >=46)
LDL Cholesterol: 103 mg/dL (ref <130)
Total CHOL/HDL Ratio: 3.6 Ratio (ref <=5.0)
Triglycerides: 115 mg/dL (ref <150)
VLDL: 23 mg/dL (ref <30)

## 2014-12-19 ENCOUNTER — Encounter: Payer: Self-pay | Admitting: Family Medicine

## 2014-12-19 ENCOUNTER — Ambulatory Visit (INDEPENDENT_AMBULATORY_CARE_PROVIDER_SITE_OTHER): Payer: Medicare Other | Admitting: Family Medicine

## 2014-12-19 VITALS — BP 120/74 | HR 81 | Resp 16 | Ht 66.0 in | Wt 227.0 lb

## 2014-12-19 DIAGNOSIS — E1065 Type 1 diabetes mellitus with hyperglycemia: Secondary | ICD-10-CM

## 2014-12-19 DIAGNOSIS — IMO0001 Reserved for inherently not codable concepts without codable children: Secondary | ICD-10-CM

## 2014-12-19 DIAGNOSIS — E1165 Type 2 diabetes mellitus with hyperglycemia: Secondary | ICD-10-CM

## 2014-12-19 DIAGNOSIS — Z794 Long term (current) use of insulin: Secondary | ICD-10-CM

## 2014-12-19 DIAGNOSIS — Z0000 Encounter for general adult medical examination without abnormal findings: Secondary | ICD-10-CM | POA: Diagnosis not present

## 2014-12-19 DIAGNOSIS — E559 Vitamin D deficiency, unspecified: Secondary | ICD-10-CM

## 2014-12-19 DIAGNOSIS — M549 Dorsalgia, unspecified: Secondary | ICD-10-CM

## 2014-12-19 DIAGNOSIS — Z1159 Encounter for screening for other viral diseases: Secondary | ICD-10-CM

## 2014-12-19 LAB — HEPATITIS C ANTIBODY: HCV Ab: NEGATIVE

## 2014-12-19 MED ORDER — GLIPIZIDE ER 5 MG PO TB24: 5.0000 mg | ORAL_TABLET | Freq: Every day | ORAL | Status: DC

## 2014-12-19 NOTE — Patient Instructions (Addendum)
F/u in 3.5 month, call if you need me before  Microalb and foot exam today  You need xray of mid back due to lower extremity pain, please go directly to the  X ray dept after you leave the order is entered  You are referred for diabetic education, and NEED to stop the sweet tea, blood sugar is too high!!!  New additional medication for diabetes is glipizide  ER 5 mg one daily, take this AS WELL AS glipizide 10 mg one daily, and continue lantus and metformin as before    Non fast HBA1C, chem 7 and EGFR, Vit D in 3.5 month  It is important that you exercise regularly at least 30 minutes 5 times a week. If you develop chest pain, have severe difficulty breathing, or feel very tired, stop exercising immediately and seek medical attention   Try to consistently eat smaller quantities so that you lose weight, improve your blood sugar , have less back pain and feel healthier

## 2014-12-19 NOTE — Progress Notes (Signed)
Subjective:    Patient ID: Kathleen [name], female    DOB: Nov 06, 1950, 64 y.o.   MRN: [id number]  HPI Preventive Screening-Counseling & Management   Patient present here today for an initial  Medicare annual wellness visit. Recent labs are reviewed, her blood sugar is extremely uncontrolled and this is also addressed at visit Sh also c/o significant mid and lower back pain with lower extremity weakness, limiting her ability to stand for prolonged periods , difficulty washing dishes, needs mid back imaging as lumbar MRI not very revealing   Current Problems (verified)   Medications Prior to Visit Allergies (verified)   PAST HISTORY  Family History (verified)  Social History  Divorced with 4 sons, 1 son lives with her. Retired from [employer]  Current exercise habits:  Exercise at home with small weights and walks a couple days a week until her legs start aching   Dietary issues discussed: low fat and low carb diet discussed   Cardiac risk factors: IDDM   Depression Screen  (Note: if answer to either of the following is "Yes", a more complete depression screening is indicated)   Over the past two weeks, have you felt down, depressed or hopeless? No  Over the past two weeks, have you felt little interest or pleasure in doing things? No  Have you lost interest or pleasure in daily life? No  Do you often feel hopeless? No  Do you cry easily over simple problems? No   Activities of Daily Living  In your present state of health, do you have any difficulty performing the following activities?  Driving?: never drove  Managing money?: No Feeding yourself?:No Getting from bed to chair?:No Climbing a flight of stairs?: avoids stairs , yes, back pain  And lowe Preparing food and eating?:No Bathing or showering?:No Getting dressed?:No Getting to the toilet?:No Using the toilet?:No Moving around from place to place?: No  Fall Risk Assessment In the past  year have you fallen or had a near fall?:No Are you currently taking any medications that make you dizzy?:No   Hearing Difficulties: No Do you often ask people to speak up or repeat themselves?:No Do you experience ringing or noises in your ears?:No Do you have difficulty understanding soft or whispered voices?:No  Cognitive Testing  Alert? Yes Normal Appearance?Yes  Oriented to person? Yes Place? Yes  Time? Yes  Displays appropriate judgment?Yes  Can read the correct time from a watch face? yes Are you having problems remembering things?No  Advanced Directives have been discussed with the patient?Yes, brochure given , full code   List the Names of Other Physician/Practitioners you currently use:  Dr Sharlet Salina (podiatry)  Dr Lowanda Foster (nephrology)  Indicate any recent Medical Services you may have received from other than [facility] providers in the past year (date may be approximate).   Assessment:    Annual Wellness Exam , initial  Plan:    Medicare Attestation  I have personally reviewed:  The patient's medical and social history  Their use of alcohol, tobacco or illicit drugs  Their current medications and supplements  The patient's functional ability including ADLs,fall risks, home safety risks, cognitive, and hearing and visual impairment  Diet and physical activities  Evidence for depression or mood disorders  The patient's weight, height, BMI, and visual acuity have been recorded in the chart. I have made referrals, counseling, and provided education to the patient based on review of the above and I have provided the  patient with a written personalized care plan for preventive services.      Review of Systems     Objective:   Physical Exam  BP 120/74 mmHg  Pulse 81  Resp 16  Ht 5\' 6"  (1.676 m)  Wt 227 lb (102.967 kg)  BMI 36.66 kg/m2  SpO2 97%  MS: decreased ROM thoraic and lumbar spine Normal, grade 5 power present in both lower extremities  Endo: foot  exam performed as documented      Assessment & Plan:  Medicare annual wellness visit, initial Annual exam as documented. Counseling done  re healthy lifestyle involving commitment to 150 minutes exercise per week, heart healthy diet, and attaining healthy weight.The importance of adequate sleep also discussed. Regular seat belt use and home safety, is also discussed. Changes in health habits are decided on by the patient with goals and time frames  set for achieving them. Immunization and cancer screening needs are specifically addressed at this visit.   Mid back pain Uncontrolled back pain radiatng to lower extremities, causing weakness, needs x ray of thoracic spine to further assess. Encouraged regular exercise including back strengthening exercise, and also weight loss  Diabetes mellitus, insulin dependent (IDDM), uncontrolled Uncontrolled Educated in office for approx 7 mins re dietary changes needed, also medication adjustment made, also referred for diabetic education Updated lab needed at/ before next visit. Diabetic foot exam due and performed at  visit

## 2014-12-19 NOTE — Assessment & Plan Note (Signed)

## 2014-12-20 LAB — MICROALBUMIN / CREATININE URINE RATIO
CREATININE, URINE: 94.9 mg/dL
MICROALB/CREAT RATIO: 3.2 mg/g (ref 0.0–30.0)
Microalb, Ur: 0.3 mg/dL (ref <2.0)

## 2014-12-20 LAB — HIV ANTIBODY (ROUTINE TESTING W REFLEX): HIV 1&2 Ab, 4th Generation: NONREACTIVE

## 2014-12-21 NOTE — Assessment & Plan Note (Signed)
Uncontrolled back pain radiatng to lower extremities, causing weakness, needs x ray of thoracic spine to further assess. Encouraged regular exercise including back strengthening exercise, and also weight loss

## 2014-12-21 NOTE — Assessment & Plan Note (Signed)
Uncontrolled Educated in office for approx 7 mins re dietary changes needed, also medication adjustment made, also referred for diabetic education Updated lab needed at/ before next visit. Diabetic foot exam due and performed at  visit

## 2014-12-31 ENCOUNTER — Other Ambulatory Visit: Payer: Self-pay | Admitting: Family Medicine

## 2015-02-10 ENCOUNTER — Other Ambulatory Visit: Payer: Self-pay | Admitting: Family Medicine

## 2015-03-11 ENCOUNTER — Other Ambulatory Visit: Payer: Self-pay | Admitting: Family Medicine

## 2015-04-15 ENCOUNTER — Other Ambulatory Visit: Payer: Self-pay

## 2015-04-15 ENCOUNTER — Ambulatory Visit (INDEPENDENT_AMBULATORY_CARE_PROVIDER_SITE_OTHER): Payer: Medicare Other | Admitting: Family Medicine

## 2015-04-15 ENCOUNTER — Encounter: Payer: Self-pay | Admitting: Family Medicine

## 2015-04-15 VITALS — BP 118/70 | HR 96 | Resp 16 | Ht 66.0 in | Wt 227.0 lb

## 2015-04-15 DIAGNOSIS — E109 Type 1 diabetes mellitus without complications: Secondary | ICD-10-CM | POA: Diagnosis not present

## 2015-04-15 DIAGNOSIS — I10 Essential (primary) hypertension: Secondary | ICD-10-CM | POA: Diagnosis not present

## 2015-04-15 DIAGNOSIS — E785 Hyperlipidemia, unspecified: Secondary | ICD-10-CM

## 2015-04-15 DIAGNOSIS — E1069 Type 1 diabetes mellitus with other specified complication: Secondary | ICD-10-CM

## 2015-04-15 DIAGNOSIS — E1065 Type 1 diabetes mellitus with hyperglycemia: Secondary | ICD-10-CM | POA: Diagnosis not present

## 2015-04-15 DIAGNOSIS — IMO0001 Reserved for inherently not codable concepts without codable children: Secondary | ICD-10-CM

## 2015-04-15 DIAGNOSIS — E559 Vitamin D deficiency, unspecified: Secondary | ICD-10-CM | POA: Diagnosis not present

## 2015-04-15 DIAGNOSIS — R0789 Other chest pain: Secondary | ICD-10-CM

## 2015-04-15 DIAGNOSIS — Z23 Encounter for immunization: Secondary | ICD-10-CM | POA: Diagnosis not present

## 2015-04-15 DIAGNOSIS — IMO0002 Reserved for concepts with insufficient information to code with codable children: Secondary | ICD-10-CM

## 2015-04-15 MED ORDER — PRAVASTATIN SODIUM 20 MG PO TABS: 20.0000 mg | ORAL_TABLET | Freq: Every day | ORAL | Status: DC

## 2015-04-15 NOTE — Assessment & Plan Note (Signed)
EKG in office today; nSR, No lVH, no ischemic changes  Controlled, no change in medication DASH diet and commitment to daily physical activity for a minimum of 30 minutes discussed and encouraged, as a part of hypertension management. The importance of attaining a healthy weight is also discussed.  BP/Weight 04/15/2015 12/19/2014 09/04/2014 08/08/2014 04/29/2014 04/23/2014 123456  Systolic BP 123456 123456 Q000111Q [id number] 0000000 [id number] Q000111Q  Diastolic BP 70 74 80 78 64 80 69  Wt. (Lbs) 227 227 228.08 227 225 225 226  BMI 36.66 36.66 36.83 36.66 36.33 36.33 36.49

## 2015-04-15 NOTE — Patient Instructions (Signed)
F/u first week in April, call if you need me sooner  CALL NURSE tomorrow to hear lab results and what you need to do about the medication you have not been taking, ( glipizide 10 mg tablet) I am sending in the cholesterol pill because you NEED this  Fasting labs April 1 or after  EKG today  Flu vaccine today  Medications will be organized and explained by nurse at visit today  Please work on good  health habits so that your health will improve. 1. Commitment to daily physical activity for 30 to 60  minutes, if you are able to do this.  2. Commitment to wise food choices. Aim for half of your  food intake to be vegetable and fruit, one quarter starchy foods, and one quarter protein. Try to eat on a regular schedule  3 meals per day, snacking between meals should be limited to vegetables or fruits or small portions of nuts. 64 ounces of water per day is generally recommended, unless you have specific health conditions, like heart failure or kidney failure where you will need to limit fluid intake.  3. Commitment to sufficient and a  good quality of physical and mental rest daily, generally between 6 to 8 hours per day.  WITH PERSISTANCE AND PERSEVERANCE, THE IMPOSSIBLE , BECOMES THE NORM!  Thanks for choosing [facility], we consider it a privelige to serve you.

## 2015-04-15 NOTE — Progress Notes (Signed)
Subjective:    Patient ID: Kathleen [name], female    DOB: 08-04-1950, 64 y.o.   MRN: [id number]  HPI   [facility]     MRN: [id number]      DOB: 11-08-1950   HPI [name] is here for follow up and re-evaluation of chronic medical conditions, medication management and review of any available recent lab and radiology data.  Preventive health is updated, specifically  Cancer screening and Immunization.   Questions or concerns regarding consultations or procedures which the PT has had in the interim are  addressed. The PT denies any adverse reactions to current medications since the last visit.  Denies polyuria, polydipsia, blurred vision , or hypoglycemic episodes. Occasional left chest and substernal schest discomfort in past 4 months   ROS Denies recent fever or chills. Denies sinus pressure, nasal congestion, ear pain or sore throat. Denies chest congestion, productive cough or wheezing. Denies  palpitations and leg swelling Denies abdominal pain, nausea, vomiting,diarrhea or constipation.   Denies dysuria, frequency, hesitancy or incontinence. Denies uncontrolled  joint pain, swelling and limitation in mobility. Denies headaches, seizures, numbness, or tingling. Denies depression, anxiety or insomnia. Denies skin break down or rash.   PE  BP 118/70 mmHg  Pulse 96  Resp 16  Ht 5\' 6"  (1.676 m)  Wt 227 lb (102.967 kg)  BMI 36.66 kg/m2  SpO2 96%  Patient alert and oriented and in no cardiopulmonary distress.  HEENT: No facial asymmetry, EOMI,   oropharynx pink and moist.  Neck supple no JVD, no mass.  Chest: Clear to auscultation bilaterally.No reproducible chest wall tenderness  CVS: S1, S2 no murmurs, no S3.Regular rate. EKG: normal, sinus rhythm , no LVH, no ischemia  ABD: Soft non tender.   Ext: No edema  MS: Adequate ROM spine, shoulders, hips and knees.  Skin: Intact, no ulcerations or rash noted.  Psych: Good eye contact, normal affect. Memory  intact not anxious or depressed appearing.  CNS: CN 2-12 intact, power,  normal throughout.no focal deficits noted.   Assessment & Plan   Essential hypertension EKG in office today; nSR, No lVH, no ischemic changes  Controlled, no change in medication DASH diet and commitment to daily physical activity for a minimum of 30 minutes discussed and encouraged, as a part of hypertension management. The importance of attaining a healthy weight is also discussed.  BP/Weight 04/15/2015 12/19/2014 09/04/2014 08/08/2014 04/29/2014 04/23/2014 123456  Systolic BP 123456 123456 Q000111Q [id number] 0000000 [id number] Q000111Q  Diastolic BP 70 74 80 78 64 80 69  Wt. (Lbs) 227 227 228.08 227 225 225 226  BMI 36.66 36.66 36.83 36.66 36.33 36.33 36.49        Diabetes mellitus, insulin dependent (IDDM), uncontrolled [name] is reminded of the importance of commitment to daily physical activity for 30 minutes or more, as able and the need to limit carbohydrate intake to 30 to 60 grams per meal to help with blood sugar control.   The need to take medication as prescribed, test blood sugar as directed, and to call between visits if there is a concern that blood sugar is uncontrolled is also discussed.   [name] is reminded of the importance of daily foot exam, annual eye examination, and good blood sugar, blood pressure and cholesterol control. Improved  Diabetic Labs Latest Ref Rng 04/15/2015 12/19/2014 12/16/2014 09/04/2014 08/08/2014  HbA1c <5.7 % 7.9(H) - 8.5(H) - 7.9(H)  Microalbumin <2.0 mg/dL - 0.3 - - -  Micro/Creat Ratio  0.0 - 30.0 mg/g - 3.2 - - -  Chol 125 - 200 mg/dL - - 174 - 178  HDL >=46 mg/dL - - 48 - 43(L)  Calc LDL <130 mg/dL - - 103 - 111(H)  Triglycerides <150 mg/dL - - 115 - 118  Creatinine 0.50 - 0.99 mg/dL 1.26(H) - 0.96 0.84 0.84   BP/Weight 04/15/2015 12/19/2014 09/04/2014 08/08/2014 04/29/2014 04/23/2014 123456  Systolic BP 123456 123456 Q000111Q [id number] 0000000 [id number] Q000111Q  Diastolic BP 70 74 80 78 64 80 69  Wt. (Lbs) 227  227 228.08 227 225 225 226  BMI 36.66 36.66 36.83 36.66 36.33 36.33 36.49   Foot/eye exam completion dates Latest Ref Rng 12/19/2014 04/23/2014  Eye Exam No Retinopathy - No Retinopathy  Foot Form Completion - Done -         Hyperlipidemia LDL goal <100 Hyperlipidemia:Low fat diet discussed and encouraged.   Lipid Panel  Lab Results  Component Value Date   CHOL 174 12/16/2014   HDL 48 12/16/2014   LDLCALC 103 12/16/2014   TRIG 115 12/16/2014   CHOLHDL 3.6 12/16/2014   Not at goal Updated lab needed at/ before next visit.      Obesity, Class II, BMI 35.0-39.9, with comorbidity (see actual BMI) Unchnaged Patient re-educated about  the importance of commitment to a  minimum of 150 minutes of exercise per week.  The importance of healthy food choices with portion control discussed. Encouraged to start a food diary, count calories and to consider  joining a support group. Sample diet sheets offered. Goals set by the patient for the next several months.   Weight /BMI 04/15/2015 12/19/2014 09/04/2014  WEIGHT 227 lb 227 lb 228 lb 1.3 oz  HEIGHT 5\' 6"  5\' 6"  5\' 6"   BMI 36.66 kg/m2 36.66 kg/m2 36.83 kg/m2    Current exercise per week 90 minutes.   Atypical chest pain Intermittent non specific left chest and substernal discomfort, not associated with activity, non radiating , and no associated light headedness, diaphoresis or nausea Office EKG: NSR , no ischemia, no LVH, normal      Review of Systems     Objective:   Physical Exam        Assessment & Plan:

## 2015-04-16 LAB — COMPLETE METABOLIC PANEL WITH GFR
ALK PHOS: 79 U/L (ref 33–130)
ALT: 14 U/L (ref 6–29)
AST: 16 U/L (ref 10–35)
Albumin: 3.8 g/dL (ref 3.6–5.1)
BILIRUBIN TOTAL: 0.4 mg/dL (ref 0.2–1.2)
BUN: 20 mg/dL (ref 7–25)
CO2: 26 mmol/L (ref 20–31)
Calcium: 9.3 mg/dL (ref 8.6–10.4)
Chloride: 98 mmol/L (ref 98–110)
Creat: 1.26 mg/dL — ABNORMAL HIGH (ref 0.50–0.99)
GFR, EST AFRICAN AMERICAN: 52 mL/min — AB (ref >=60)
GFR, EST NON AFRICAN AMERICAN: 45 mL/min — AB (ref >=60)
Glucose, Bld: 75 mg/dL (ref 65–99)
Potassium: 4.4 mmol/L (ref 3.5–5.3)
Sodium: 134 mmol/L — ABNORMAL LOW (ref 135–146)
Total Protein: 7.5 g/dL (ref 6.1–8.1)

## 2015-04-16 LAB — HEMOGLOBIN A1C
HEMOGLOBIN A1C: 7.9 % — AB (ref <5.7)
Mean Plasma Glucose: 180 mg/dL — ABNORMAL HIGH (ref <117)

## 2015-04-16 LAB — VITAMIN D 25 HYDROXY (VIT D DEFICIENCY, FRACTURES): VIT D 25 HYDROXY: 24 ng/mL — AB (ref 30–100)

## 2015-04-16 MED ORDER — GLIPIZIDE ER 5 MG PO TB24: 5.0000 mg | ORAL_TABLET | Freq: Every day | ORAL | Status: DC

## 2015-04-21 ENCOUNTER — Other Ambulatory Visit: Payer: Self-pay | Admitting: Family Medicine

## 2015-05-05 DIAGNOSIS — R0789 Other chest pain: Secondary | ICD-10-CM | POA: Insufficient documentation

## 2015-05-05 NOTE — Assessment & Plan Note (Signed)
Hyperlipidemia:Low fat diet discussed and encouraged.   Lipid Panel  Lab Results  Component Value Date   CHOL 174 12/16/2014   HDL 48 12/16/2014   LDLCALC 103 12/16/2014   TRIG 115 12/16/2014   CHOLHDL 3.6 12/16/2014   Not at goal Updated lab needed at/ before next visit.

## 2015-05-05 NOTE — Assessment & Plan Note (Signed)
Kathleen [name] is reminded of the importance of commitment to daily physical activity for 30 minutes or more, as able and the need to limit carbohydrate intake to 30 to 60 grams per meal to help with blood sugar control.   The need to take medication as prescribed, test blood sugar as directed, and to call between visits if there is a concern that blood sugar is uncontrolled is also discussed.   Kathleen [name] is reminded of the importance of daily foot exam, annual eye examination, and good blood sugar, blood pressure and cholesterol control. Improved  Diabetic Labs Latest Ref Rng 04/15/2015 12/19/2014 12/16/2014 09/04/2014 08/08/2014  HbA1c <5.7 % 7.9(H) - 8.5(H) - 7.9(H)  Microalbumin <2.0 mg/dL - 0.3 - - -  Micro/Creat Ratio 0.0 - 30.0 mg/g - 3.2 - - -  Chol 125 - 200 mg/dL - - 174 - 178  HDL >=46 mg/dL - - 48 - 43(L)  Calc LDL <130 mg/dL - - 103 - 111(H)  Triglycerides <150 mg/dL - - 115 - 118  Creatinine 0.50 - 0.99 mg/dL 1.26(H) - 0.96 0.84 0.84   BP/Weight 04/15/2015 12/19/2014 09/04/2014 08/08/2014 04/29/2014 04/23/2014 123456  Systolic BP 123456 123456 Q000111Q [id number] 0000000 [id number] Q000111Q  Diastolic BP 70 74 80 78 64 80 69  Wt. (Lbs) 227 227 228.08 227 225 225 226  BMI 36.66 36.66 36.83 36.66 36.33 36.33 36.49   Foot/eye exam completion dates Latest Ref Rng 12/19/2014 04/23/2014  Eye Exam No Retinopathy - No Retinopathy  Foot Form Completion - Done -

## 2015-05-05 NOTE — Assessment & Plan Note (Signed)
Unchnaged Patient re-educated about  the importance of commitment to a  minimum of 150 minutes of exercise per week.  The importance of healthy food choices with portion control discussed. Encouraged to start a food diary, count calories and to consider  joining a support group. Sample diet sheets offered. Goals set by the patient for the next several months.   Weight /BMI 04/15/2015 12/19/2014 09/04/2014  WEIGHT 227 lb 227 lb 228 lb 1.3 oz  HEIGHT 5\' 6"  5\' 6"  5\' 6"   BMI 36.66 kg/m2 36.66 kg/m2 36.83 kg/m2    Current exercise per week 90 minutes.

## 2015-05-05 NOTE — Assessment & Plan Note (Signed)
Intermittent non specific left chest and substernal discomfort, not associated with activity, non radiating , and no associated light headedness, diaphoresis or nausea Office EKG: NSR , no ischemia, no LVH, normal

## 2015-05-16 ENCOUNTER — Other Ambulatory Visit: Payer: Self-pay | Admitting: Family Medicine

## 2015-06-26 ENCOUNTER — Other Ambulatory Visit: Payer: Self-pay

## 2015-06-26 DIAGNOSIS — E785 Hyperlipidemia, unspecified: Secondary | ICD-10-CM

## 2015-06-26 MED ORDER — PRAVASTATIN SODIUM 20 MG PO TABS: 20.0000 mg | ORAL_TABLET | Freq: Every day | ORAL | Status: DC

## 2015-06-26 MED ORDER — RAMIPRIL 10 MG PO CAPS: ORAL_CAPSULE | ORAL | Status: DC

## 2015-07-08 ENCOUNTER — Other Ambulatory Visit: Payer: Self-pay

## 2015-07-08 MED ORDER — CLONIDINE HCL 0.3 MG PO TABS: ORAL_TABLET | ORAL | Status: DC

## 2015-07-08 MED ORDER — SPIRONOLACTONE-HCTZ 25-25 MG PO TABS: 1.0000 | ORAL_TABLET | Freq: Every day | ORAL | Status: DC

## 2015-08-07 ENCOUNTER — Other Ambulatory Visit: Payer: Self-pay | Admitting: Family Medicine

## 2015-08-12 ENCOUNTER — Other Ambulatory Visit: Payer: Self-pay | Admitting: Family Medicine

## 2015-08-12 ENCOUNTER — Ambulatory Visit (INDEPENDENT_AMBULATORY_CARE_PROVIDER_SITE_OTHER): Payer: Medicare Other | Admitting: Family Medicine

## 2015-08-12 ENCOUNTER — Encounter: Payer: Self-pay | Admitting: Family Medicine

## 2015-08-12 VITALS — BP 138/82 | HR 88 | Resp 16 | Ht 66.0 in | Wt 225.0 lb

## 2015-08-12 DIAGNOSIS — E1069 Type 1 diabetes mellitus with other specified complication: Secondary | ICD-10-CM

## 2015-08-12 DIAGNOSIS — IMO0001 Reserved for inherently not codable concepts without codable children: Secondary | ICD-10-CM

## 2015-08-12 DIAGNOSIS — Z23 Encounter for immunization: Secondary | ICD-10-CM | POA: Diagnosis not present

## 2015-08-12 DIAGNOSIS — E785 Hyperlipidemia, unspecified: Secondary | ICD-10-CM | POA: Diagnosis not present

## 2015-08-12 DIAGNOSIS — E1065 Type 1 diabetes mellitus with hyperglycemia: Secondary | ICD-10-CM

## 2015-08-12 DIAGNOSIS — I10 Essential (primary) hypertension: Secondary | ICD-10-CM | POA: Diagnosis not present

## 2015-08-12 DIAGNOSIS — Z1231 Encounter for screening mammogram for malignant neoplasm of breast: Secondary | ICD-10-CM

## 2015-08-12 DIAGNOSIS — IMO0002 Reserved for concepts with insufficient information to code with codable children: Secondary | ICD-10-CM

## 2015-08-12 LAB — CBC
HCT: 37 % (ref 35.0–45.0)
Hemoglobin: 12 g/dL (ref 11.7–15.5)
MCH: 27.3 pg (ref 27.0–33.0)
MCHC: 32.4 g/dL (ref 32.0–36.0)
MCV: 84.1 fL (ref 80.0–100.0)
MPV: 10.4 fL (ref 7.5–12.5)
Platelets: 296 10*3/uL (ref 140–400)
RBC: 4.4 MIL/uL (ref 3.80–5.10)
RDW: 14.2 % (ref 11.0–15.0)
WBC: 6.1 10*3/uL (ref 3.8–10.8)

## 2015-08-12 MED ORDER — METFORMIN HCL 1000 MG PO TABS: ORAL_TABLET | ORAL | Status: DC

## 2015-08-12 NOTE — Progress Notes (Signed)
Subjective:    Patient ID: Kathleen [name], female    DOB: 1950-10-28, 65 y.o.   MRN: [id number]  HPI    Kathleen [name]     MRN: [id number]      DOB: March 27, 1951   HPI Kathleen [name] is here for follow up and re-evaluation of chronic medical conditions, medication management and review of any available recent lab and radiology data.  Preventive health is updated, specifically  Cancer screening and Immunization.   Questions or concerns regarding consultations or procedures which the PT has had in the interim are  addressed. The PT denies any adverse reactions to current medications since the last visit.  There are no new concerns.  There are no specific complaints   ROS Denies recent fever or chills. Denies sinus pressure, nasal congestion, ear pain or sore throat. Denies chest congestion, productive cough or wheezing. Denies chest pains, palpitations and leg swelling Denies abdominal pain, nausea, vomiting,diarrhea or constipation.   Denies dysuria, frequency, hesitancy or incontinence. Denies joint pain, swelling and limitation in mobility. Denies headaches, seizures, numbness, or tingling. Denies depression, anxiety or insomnia. Denies skin break down or rash.   PE  BP 138/82 mmHg  Pulse 88  Resp 16  Ht 5\' 6"  (1.676 m)  Wt 225 lb (102.059 kg)  BMI 36.33 kg/m2  SpO2 99%  Patient alert and oriented and in no cardiopulmonary distress.  HEENT: No facial asymmetry, EOMI,   oropharynx pink and moist.  Neck supple no JVD, no mass.  Chest: Clear to auscultation bilaterally.  CVS: S1, S2 no murmurs, no S3.Regular rate.  ABD: Soft non tender.   Ext: No edema  MS: Adequate ROM spine, shoulders, hips and knees.  Skin: Intact, no ulcerations or rash noted.  Psych: Good eye contact, normal affect. Memory intact not anxious or depressed appearing.  CNS: CN 2-12 intact, power,  normal throughout.no focal deficits noted.   Assessment & Plan  Essential  hypertension Controlled, no change in medication DASH diet and commitment to daily physical activity for a minimum of 30 minutes discussed and encouraged, as a part of hypertension management. The importance of attaining a healthy weight is also discussed.  BP/Weight 08/12/2015 04/15/2015 12/19/2014 09/04/2014 08/08/2014 04/29/2014 [id number]  Systolic BP 0000000 123456 123456 Q000111Q [id number] 0000000 [id number]  Diastolic BP 82 70 74 80 78 64 80  Wt. (Lbs) 225 227 227 228.08 227 225 225  BMI 36.33 36.66 36.66 36.83 36.66 36.33 36.33        Diabetes mellitus, insulin dependent (IDDM), uncontrolled Deteriorated, non compliant with testing , calling in, and diet, in denial currently as to how serious her illness is. Reported at visit that she tests on avg twice weekly and felt that she was doing well. HBa1C obtained after the visit shows marked deterioration Kathleen [name] is reminded of the importance of commitment to daily physical activity for 30 minutes or more, as able and the need to limit carbohydrate intake to 30 to 60 grams per meal to help with blood sugar control.   The need to take medication as prescribed, test blood sugar as directed, and to call between visits if there is a concern that blood sugar is uncontrolled is also discussed.   Kathleen [name] is reminded of the importance of daily foot exam, annual eye examination, and good blood sugar, blood pressure and cholesterol control.  Diabetic Labs Latest Ref Rng 08/12/2015 04/15/2015 12/19/2014 12/16/2014 09/04/2014  HbA1c <5.7 % 9.2(H) 7.9(H) - 8.5(H) -  Microalbumin <  2.0 mg/dL - - 0.3 - -  Micro/Creat Ratio 0.0 - 30.0 mg/g - - 3.2 - -  Chol 125 - 200 mg/dL 168 - - 174 -  HDL >=46 mg/dL 47 - - 48 -  Calc LDL <130 mg/dL 84 - - 103 -  Triglycerides <150 mg/dL 186(H) - - 115 -  Creatinine 0.50 - 0.99 mg/dL 1.28(H) 1.26(H) - 0.96 0.84   BP/Weight 08/12/2015 04/15/2015 12/19/2014 09/04/2014 08/08/2014 04/29/2014 [id number]  Systolic BP 0000000 123456 123456 Q000111Q [id number] 0000000 [id number]  Diastolic  BP 82 70 74 80 78 64 80  Wt. (Lbs) 225 227 227 228.08 227 225 225  BMI 36.33 36.66 36.66 36.83 36.66 36.33 36.33   Foot/eye exam completion dates Latest Ref Rng 12/19/2014 04/23/2014  Eye Exam No Retinopathy - No Retinopathy  Foot Form Completion - Done -   Medication adjustment is needed after ascertaining exactly what she is taking      Obesity, Class II, BMI 35.0-39.9, with comorbidity (see actual BMI) Unchanged. Educated about  the importance of commitment to a  minimum of 150 minutes of exercise per week.  The importance of healthy food choices with portion control discussed. Encouraged to start a food diary, count calories and to consider  joining a support group. Sample diet sheets offered. Goals set by the patient for the next several months.   Weight /BMI 08/12/2015 04/15/2015 12/19/2014  WEIGHT 225 lb 227 lb 227 lb  HEIGHT 5\' 6"  5\' 6"  5\' 6"   BMI 36.33 kg/m2 36.66 kg/m2 36.66 kg/m2    Current exercise per week 90 minutes.       Review of Systems     Objective:   Physical Exam        Assessment & Plan:

## 2015-08-12 NOTE — Patient Instructions (Addendum)
Annual physical exam end August, call if you need me sooner  Fasting labs today  You are being referred for mammogram, due July 2 or after, and My eye Doc  Pneumonia vaccine today  Please work on good  health habits so that your health will improve. 1. Commitment to daily physical activity for 30 to 60  minutes, if you are able to do this.  2. Commitment to wise food choices. Aim for half of your  food intake to be vegetable and fruit, one quarter starchy foods, and one quarter protein. Try to eat on a regular schedule  3 meals per day, snacking between meals should be limited to vegetables or fruits or small portions of nuts. 64 ounces of water per day is generally recommended, unless you have specific health conditions, like heart failure or kidney failure where you will need to limit fluid intake.  3. Commitment to sufficient and a  good quality of physical and mental rest daily, generally between 6 to 8 hours per day.  WITH PERSISTANCE AND PERSEVERANCE, THE IMPOSSIBLE , BECOMES THE NORM!   Thank you  for choosing [facility]. We consider it a privelige to serve you.  Delivering excellent health care in a caring and  compassionate way is our goal.  Partnering with you,  so that together we can achieve this goal is our strategy.

## 2015-08-13 LAB — COMPLETE METABOLIC PANEL WITH GFR
ALT: 13 U/L (ref 6–29)
AST: 11 U/L (ref 10–35)
Albumin: 4.2 g/dL (ref 3.6–5.1)
Alkaline Phosphatase: 88 U/L (ref 33–130)
BUN: 20 mg/dL (ref 7–25)
CHLORIDE: 99 mmol/L (ref 98–110)
CO2: 26 mmol/L (ref 20–31)
Calcium: 9.8 mg/dL (ref 8.6–10.4)
Creat: 1.28 mg/dL — ABNORMAL HIGH (ref 0.50–0.99)
GFR, EST NON AFRICAN AMERICAN: 44 mL/min — AB (ref >=60)
GFR, Est African American: 50 mL/min — ABNORMAL LOW (ref >=60)
GLUCOSE: 164 mg/dL — AB (ref 65–99)
POTASSIUM: 4.8 mmol/L (ref 3.5–5.3)
SODIUM: 135 mmol/L (ref 135–146)
Total Bilirubin: 0.5 mg/dL (ref 0.2–1.2)
Total Protein: 7.9 g/dL (ref 6.1–8.1)

## 2015-08-13 LAB — TSH: TSH: 1.23 m[IU]/L

## 2015-08-13 LAB — LIPID PANEL
CHOL/HDL RATIO: 3.6 ratio (ref <=5.0)
Cholesterol: 168 mg/dL (ref 125–200)
HDL: 47 mg/dL (ref >=46)
LDL Cholesterol: 84 mg/dL (ref <130)
Triglycerides: 186 mg/dL — ABNORMAL HIGH (ref <150)
VLDL: 37 mg/dL — AB (ref <30)

## 2015-08-13 LAB — HEMOGLOBIN A1C
HEMOGLOBIN A1C: 9.2 % — AB (ref <5.7)
MEAN PLASMA GLUCOSE: 217 mg/dL

## 2015-08-14 ENCOUNTER — Other Ambulatory Visit: Payer: Self-pay

## 2015-08-14 MED ORDER — CLONIDINE HCL 0.3 MG PO TABS: ORAL_TABLET | ORAL | Status: DC

## 2015-08-14 MED ORDER — GLIPIZIDE ER 10 MG PO TB24: 10.0000 mg | ORAL_TABLET | Freq: Every day | ORAL | Status: DC

## 2015-08-14 MED ORDER — SPIRONOLACTONE-HCTZ 25-25 MG PO TABS: 1.0000 | ORAL_TABLET | Freq: Every day | ORAL | Status: DC

## 2015-08-14 MED ORDER — AMLODIPINE BESYLATE 10 MG PO TABS: ORAL_TABLET | ORAL | Status: DC

## 2015-08-17 NOTE — Assessment & Plan Note (Signed)
Deteriorated, non compliant with testing , calling in, and diet, in denial currently as to how serious her illness is. Reported at visit that she tests on avg twice weekly and felt that she was doing well. HBa1C obtained after the visit shows marked deterioration Kathleen [name] is reminded of the importance of commitment to daily physical activity for 30 minutes or more, as able and the need to limit carbohydrate intake to 30 to 60 grams per meal to help with blood sugar control.   The need to take medication as prescribed, test blood sugar as directed, and to call between visits if there is a concern that blood sugar is uncontrolled is also discussed.   Kathleen [name] is reminded of the importance of daily foot exam, annual eye examination, and good blood sugar, blood pressure and cholesterol control.  Diabetic Labs Latest Ref Rng 08/12/2015 04/15/2015 12/19/2014 12/16/2014 09/04/2014  HbA1c <5.7 % 9.2(H) 7.9(H) - 8.5(H) -  Microalbumin <2.0 mg/dL - - 0.3 - -  Micro/Creat Ratio 0.0 - 30.0 mg/g - - 3.2 - -  Chol 125 - 200 mg/dL 168 - - 174 -  HDL >=46 mg/dL 47 - - 48 -  Calc LDL <130 mg/dL 84 - - 103 -  Triglycerides <150 mg/dL 186(H) - - 115 -  Creatinine 0.50 - 0.99 mg/dL 1.28(H) 1.26(H) - 0.96 0.84   BP/Weight 08/12/2015 04/15/2015 12/19/2014 09/04/2014 08/08/2014 04/29/2014 [id number]  Systolic BP 0000000 123456 123456 Q000111Q [id number] 0000000 [id number]  Diastolic BP 82 70 74 80 78 64 80  Wt. (Lbs) 225 227 227 228.08 227 225 225  BMI 36.33 36.66 36.66 36.83 36.66 36.33 36.33   Foot/eye exam completion dates Latest Ref Rng 12/19/2014 04/23/2014  Eye Exam No Retinopathy - No Retinopathy  Foot Form Completion - Done -   Medication adjustment is needed after ascertaining exactly what she is taking

## 2015-08-17 NOTE — Assessment & Plan Note (Signed)
Controlled, no change in medication DASH diet and commitment to daily physical activity for a minimum of 30 minutes discussed and encouraged, as a part of hypertension management. The importance of attaining a healthy weight is also discussed.  BP/Weight 08/12/2015 04/15/2015 12/19/2014 09/04/2014 08/08/2014 04/29/2014 [id number]  Systolic BP 0000000 123456 123456 [id number] 0000000 [id number]  Diastolic BP 82 70 74 80 78 64 80  Wt. (Lbs) 225 227 227 228.08 227 225 225  BMI 36.33 36.66 36.66 36.83 36.66 36.33 36.33

## 2015-08-17 NOTE — Assessment & Plan Note (Signed)
Unchanged. Educated about  the importance of commitment to a  minimum of 150 minutes of exercise per week.  The importance of healthy food choices with portion control discussed. Encouraged to start a food diary, count calories and to consider  joining a support group. Sample diet sheets offered. Goals set by the patient for the next several months.   Weight /BMI 08/12/2015 04/15/2015 12/19/2014  WEIGHT 225 lb 227 lb 227 lb  HEIGHT 5\' 6"  5\' 6"  5\' 6"   BMI 36.33 kg/m2 36.66 kg/m2 36.66 kg/m2    Current exercise per week 90 minutes.

## 2015-09-06 ENCOUNTER — Other Ambulatory Visit: Payer: Self-pay | Admitting: Family Medicine

## 2015-10-08 DIAGNOSIS — H524 Presbyopia: Secondary | ICD-10-CM | POA: Diagnosis not present

## 2015-10-08 DIAGNOSIS — H5212 Myopia, left eye: Secondary | ICD-10-CM | POA: Diagnosis not present

## 2015-10-08 DIAGNOSIS — E119 Type 2 diabetes mellitus without complications: Secondary | ICD-10-CM | POA: Diagnosis not present

## 2015-10-10 ENCOUNTER — Other Ambulatory Visit: Payer: Self-pay | Admitting: Family Medicine

## 2015-12-10 ENCOUNTER — Other Ambulatory Visit: Payer: Self-pay | Admitting: Family Medicine

## 2015-12-29 ENCOUNTER — Other Ambulatory Visit (HOSPITAL_COMMUNITY)
Admission: RE | Admit: 2015-12-29 | Discharge: 2015-12-29 | Disposition: A | Discharge status: Home / Self Care | Payer: Medicare Other | Source: Skilled Nursing Facility | Attending: Family Medicine | Admitting: Family Medicine

## 2015-12-29 ENCOUNTER — Encounter: Payer: Self-pay | Admitting: Family Medicine

## 2015-12-29 ENCOUNTER — Ambulatory Visit (INDEPENDENT_AMBULATORY_CARE_PROVIDER_SITE_OTHER): Payer: Medicare Other | Admitting: Family Medicine

## 2015-12-29 VITALS — BP 120/80 | HR 86 | Resp 16 | Ht 66.0 in | Wt 226.0 lb

## 2015-12-29 DIAGNOSIS — E119 Type 2 diabetes mellitus without complications: Secondary | ICD-10-CM

## 2015-12-29 DIAGNOSIS — E669 Obesity, unspecified: Secondary | ICD-10-CM | POA: Insufficient documentation

## 2015-12-29 DIAGNOSIS — E785 Hyperlipidemia, unspecified: Secondary | ICD-10-CM

## 2015-12-29 DIAGNOSIS — Z23 Encounter for immunization: Secondary | ICD-10-CM | POA: Diagnosis not present

## 2015-12-29 DIAGNOSIS — Z1231 Encounter for screening mammogram for malignant neoplasm of breast: Secondary | ICD-10-CM

## 2015-12-29 DIAGNOSIS — Z1211 Encounter for screening for malignant neoplasm of colon: Secondary | ICD-10-CM | POA: Diagnosis not present

## 2015-12-29 DIAGNOSIS — Z1239 Encounter for other screening for malignant neoplasm of breast: Secondary | ICD-10-CM

## 2015-12-29 DIAGNOSIS — E1069 Type 1 diabetes mellitus with other specified complication: Secondary | ICD-10-CM

## 2015-12-29 DIAGNOSIS — Z0000 Encounter for general adult medical examination without abnormal findings: Secondary | ICD-10-CM

## 2015-12-29 DIAGNOSIS — E1065 Type 1 diabetes mellitus with hyperglycemia: Secondary | ICD-10-CM

## 2015-12-29 DIAGNOSIS — E559 Vitamin D deficiency, unspecified: Secondary | ICD-10-CM | POA: Diagnosis not present

## 2015-12-29 DIAGNOSIS — IMO0002 Reserved for concepts with insufficient information to code with codable children: Secondary | ICD-10-CM

## 2015-12-29 DIAGNOSIS — E1169 Type 2 diabetes mellitus with other specified complication: Secondary | ICD-10-CM

## 2015-12-29 LAB — COMPLETE METABOLIC PANEL WITH GFR
ALBUMIN: 4.3 g/dL (ref 3.6–5.1)
ALK PHOS: 75 U/L (ref 33–130)
ALT: 10 U/L (ref 6–29)
AST: 11 U/L (ref 10–35)
BUN: 26 mg/dL — AB (ref 7–25)
CO2: 25 mmol/L (ref 20–31)
Calcium: 9.6 mg/dL (ref 8.6–10.4)
Chloride: 102 mmol/L (ref 98–110)
Creat: 1.43 mg/dL — ABNORMAL HIGH (ref 0.50–0.99)
GFR, Est African American: 44 mL/min — ABNORMAL LOW (ref >=60)
GFR, Est Non African American: 38 mL/min — ABNORMAL LOW (ref >=60)
GLUCOSE: 94 mg/dL (ref 65–99)
POTASSIUM: 4.7 mmol/L (ref 3.5–5.3)
SODIUM: 136 mmol/L (ref 135–146)
TOTAL PROTEIN: 7.8 g/dL (ref 6.1–8.1)
Total Bilirubin: 0.4 mg/dL (ref 0.2–1.2)

## 2015-12-29 LAB — POC HEMOCCULT BLD/STL (OFFICE/1-CARD/DIAGNOSTIC): Fecal Occult Blood, POC: NEGATIVE

## 2015-12-29 LAB — LIPID PANEL
CHOL/HDL RATIO: 3.3 ratio (ref <=5.0)
Cholesterol: 147 mg/dL (ref 125–200)
HDL: 45 mg/dL — ABNORMAL LOW (ref >=46)
LDL Cholesterol: 75 mg/dL (ref <130)
Triglycerides: 133 mg/dL (ref <150)
VLDL: 27 mg/dL (ref <30)

## 2015-12-29 MED ORDER — TERBINAFINE HCL 250 MG PO TABS: 250.0000 mg | ORAL_TABLET | Freq: Every day | ORAL | 1 refills | Status: DC

## 2015-12-29 NOTE — Progress Notes (Signed)
DONE

## 2015-12-29 NOTE — Assessment & Plan Note (Signed)

## 2015-12-29 NOTE — Patient Instructions (Addendum)
Annual wellness in 3.5 month, call if you need me sooner  Labs today  Fasting labs 3 days BEFORE the next visit  Please get mammogram appt at [facility] and keep appt, past due  Flu vaccine today  New daily medication terbinafine for nail fungus for 3 months  Goal for fasting blood sugar ranges from 90 to 130 and 2 hours after any meal or at bedtime should be between 140 to 180.   Please work on good  health habits so that your health will improve. 1. Commitment to daily physical activity for 30 to 60  minutes, if you are able to do this.  2. Commitment to wise food choices. Aim for half of your  food intake to be vegetable and fruit, one quarter starchy foods, and one quarter protein. Try to eat on a regular schedule  3 meals per day, snacking between meals should be limited to vegetables or fruits or small portions of nuts. 64 ounces of water per day is generally recommended, unless you have specific health conditions, like heart failure or kidney failure where you will need to limit fluid intake.  3. Commitment to sufficient and a  good quality of physical and mental rest daily, generally between 6 to 8 hours per day.  WITH PERSISTANCE AND PERSEVERANCE, THE IMPOSSIBLE , BECOMES THE NORM!   Thank you  for choosing [facility]. We consider it a privelige to serve you.  Delivering excellent health care in a caring and  compassionate way is our goal.  Partnering with you,  so that together we can achieve this goal is our strategy.

## 2015-12-29 NOTE — Assessment & Plan Note (Signed)
After obtaining informed consent, the vaccine is  administered by LPN.  

## 2015-12-29 NOTE — Assessment & Plan Note (Signed)
Updated lab needed at/ before next visit. Kathleen [name] is reminded of the importance of commitment to daily physical activity for 30 minutes or more, as able and the need to limit carbohydrate intake to 30 to 60 grams per meal to help with blood sugar control.   The need to take medication as prescribed, test blood sugar as directed, and to call between visits if there is a concern that blood sugar is uncontrolled is also discussed.   Kathleen [name] is reminded of the importance of daily foot exam, annual eye examination, and good blood sugar, blood pressure and cholesterol control.  Diabetic Labs Latest Ref Rng & Units 08/12/2015 04/15/2015 12/19/2014 12/16/2014 09/04/2014  HbA1c <5.7 % 9.2(H) 7.9(H) - 8.5(H) -  Microalbumin <2.0 mg/dL - - 0.3 - -  Micro/Creat Ratio 0.0 - 30.0 mg/g - - 3.2 - -  Chol 125 - 200 mg/dL 168 - - 174 -  HDL >=46 mg/dL 47 - - 48 -  Calc LDL <130 mg/dL 84 - - 103 -  Triglycerides <150 mg/dL 186(H) - - 115 -  Creatinine 0.50 - 0.99 mg/dL 1.28(H) 1.26(H) - 0.96 0.84   BP/Weight 12/29/2015 08/12/2015 04/15/2015 12/19/2014 09/04/2014 08/08/2014 [id number]  Systolic BP 123456 0000000 123456 123456 [id number] 0000000  Diastolic BP 80 82 70 74 80 78 64  Wt. (Lbs) 226 225 227 227 228.08 227 225  BMI 36.48 36.33 36.66 36.66 36.83 36.66 36.33   Foot/eye exam completion dates Latest Ref Rng & Units 12/19/2014 04/23/2014  Eye Exam No Retinopathy - No Retinopathy  Foot Form Completion - Done -

## 2015-12-29 NOTE — Progress Notes (Signed)
Kathleen [name]     MRN: [id number]      DOB: 06-Nov-1950  HPI: Patient is in for annual physical exam. No other health concerns are expressed or addressed at the visit. Recent labs, if available are reviewed. Immunization is reviewed , and  updated if needed.   PE: Pleasant  female, alert and oriented x 3, in no cardio-pulmonary distress. Afebrile. HEENT No facial trauma or asymetry. Sinuses non tender.  Extra occullar muscles intact, pupils equally reactive to light. External ears normal, tympanic membranes clear. Oropharynx moist, no exudate. Neck: supple, no adenopathy,JVD or thyromegaly.No bruits.  Chest: Clear to ascultation bilaterally.No crackles or wheezes. Non tender to palpation  Breast: No asymetry,no masses or lumps. No tenderness. No nipple discharge or inversion. No axillary or supraclavicular adenopathy  Cardiovascular system; Heart sounds normal,  S1 and  S2 ,no S3.  No murmur, or thrill. Apical beat not displaced Peripheral pulses normal.  Abdomen: Soft, non tender, no organomegaly or masses. No bruits. Bowel sounds normal. No guarding, tenderness or rebound.  Rectal:  Normal sphincter tone. No rectal mass. Guaiac negative stool.  GU: External genitalia normal female genitalia , normal female distribution of hair. No lesions. Urethral meatus normal in size, no  Prolapse, no lesions visibly  Present. Bladder non tender. Vagina pink and moist , with no visible lesions , discharge present . Adequate pelvic support no  cystocele or rectocele noted Cervix pink and appears healthy, no lesions or ulcerations noted, no discharge noted from os Uterus normal size, no adnexal masses, no cervical motion or adnexal tenderness.   Musculoskeletal exam: Full ROM of spine, hips , shoulders and knees. No deformity ,swelling or crepitus noted. No muscle wasting or atrophy.   Neurologic: Cranial nerves 2 to 12 intact. Power, tone ,sensation and reflexes  normal throughout. No disturbance in gait. No tremor.  Skin: Intact, no ulceration, erythema , scaling or rash noted. Pigmentation normal throughout  Psych; Normal mood and affect. Judgement and concentration normal   Assessment & Plan:  Annual physical exam Annual exam as documented. Counseling done  re healthy lifestyle involving commitment to 150 minutes exercise per week, heart healthy diet, and attaining healthy weight.The importance of adequate sleep also discussed. Regular seat belt use and home safety, is also discussed. Changes in health habits are decided on by the patient with goals and time frames  set for achieving them. Immunization and cancer screening needs are specifically addressed at this visit.   Need for prophylactic vaccination and inoculation against influenza After obtaining informed consent, the vaccine is  administered by LPN.   Diabetes mellitus, insulin dependent (IDDM), uncontrolled Updated lab needed at/ before next visit. Kathleen [name] is reminded of the importance of commitment to daily physical activity for 30 minutes or more, as able and the need to limit carbohydrate intake to 30 to 60 grams per meal to help with blood sugar control.   The need to take medication as prescribed, test blood sugar as directed, and to call between visits if there is a concern that blood sugar is uncontrolled is also discussed.   Kathleen [name] is reminded of the importance of daily foot exam, annual eye examination, and good blood sugar, blood pressure and cholesterol control.  Diabetic Labs Latest Ref Rng & Units 08/12/2015 04/15/2015 12/19/2014 12/16/2014 09/04/2014  HbA1c <5.7 % 9.2(H) 7.9(H) - 8.5(H) -  Microalbumin <2.0 mg/dL - - 0.3 - -  Micro/Creat Ratio 0.0 - 30.0 mg/g - - 3.2 - -  Chol 125 - 200 mg/dL 168 - - 174 -  HDL >=46 mg/dL 47 - - 48 -  Calc LDL <130 mg/dL 84 - - 103 -  Triglycerides <150 mg/dL 186(H) - - 115 -  Creatinine 0.50 - 0.99 mg/dL 1.28(H) 1.26(H) -  0.96 0.84   BP/Weight 12/29/2015 08/12/2015 04/15/2015 12/19/2014 09/04/2014 08/08/2014 [id number]  Systolic BP 123456 0000000 123456 123456 Q000111Q [id number] 0000000  Diastolic BP 80 82 70 74 80 78 64  Wt. (Lbs) 226 225 227 227 228.08 227 225  BMI 36.48 36.33 36.66 36.66 36.83 36.66 36.33   Foot/eye exam completion dates Latest Ref Rng & Units 12/19/2014 04/23/2014  Eye Exam No Retinopathy - No Retinopathy  Foot Form Completion - Done -

## 2015-12-30 LAB — HEMOGLOBIN A1C
HEMOGLOBIN A1C: 8.5 % — AB (ref <5.7)
MEAN PLASMA GLUCOSE: 197 mg/dL

## 2015-12-30 LAB — MICROALBUMIN / CREATININE URINE RATIO
CREATININE, UR: 95 mg/dL
MICROALB UR: 3.1 ug/mL — AB
MICROALB/CREAT RATIO: 3.3 mg/g{creat} (ref 0.0–30.0)

## 2015-12-30 LAB — VITAMIN D 25 HYDROXY (VIT D DEFICIENCY, FRACTURES): Vit D, 25-Hydroxy: 25 ng/mL — ABNORMAL LOW (ref 30–100)

## 2016-01-02 ENCOUNTER — Other Ambulatory Visit: Payer: Self-pay

## 2016-01-02 ENCOUNTER — Telehealth: Payer: Self-pay

## 2016-01-02 DIAGNOSIS — E1065 Type 1 diabetes mellitus with hyperglycemia: Secondary | ICD-10-CM

## 2016-01-02 DIAGNOSIS — IMO0002 Reserved for concepts with insufficient information to code with codable children: Secondary | ICD-10-CM

## 2016-01-02 DIAGNOSIS — E1069 Type 1 diabetes mellitus with other specified complication: Principal | ICD-10-CM

## 2016-01-02 MED ORDER — INSULIN GLARGINE 100 UNIT/ML SOLOSTAR PEN: 90.0000 [IU] | PEN_INJECTOR | Freq: Every day | SUBCUTANEOUS | 2 refills | Status: DC

## 2016-01-02 MED ORDER — METFORMIN HCL 1000 MG PO TABS: ORAL_TABLET | ORAL | 1 refills | Status: DC

## 2016-01-02 NOTE — Telephone Encounter (Signed)
-----  Message from Fayrene Helper, MD sent at 01/02/2016  8:23 AM EDT ----- pls advise and change dosing a, notify pharmacy also after you spk with pt. Improved, but sugar still too high Reduce metformin 1000 mg from twice daily to once daily (impaired kidney function) Increase lantus from 80 to 90 units daily Start once daily vit D 800IU (OTC) Walk 20 to 30 mins every day to improve HDL  Needs HBA1C, chem 7 and EGFR non fast for next visit 3 to 7 days BEFORE ENSURE she commits to 64 ounces water daily to help kidneys to work well, dehydrated when she had labs ??? pls ask!

## 2016-01-02 NOTE — Telephone Encounter (Signed)
Labs mailed and patient aware and dose changes made

## 2016-01-07 ENCOUNTER — Inpatient Hospital Stay (HOSPITAL_COMMUNITY): Admission: RE | Admit: 2016-01-07 | Payer: Medicare Other | Source: Ambulatory Visit

## 2016-01-07 ENCOUNTER — Ambulatory Visit: Payer: Medicare Other

## 2016-01-14 ENCOUNTER — Ambulatory Visit (HOSPITAL_COMMUNITY): Payer: Medicare Other

## 2016-01-26 ENCOUNTER — Ambulatory Visit (HOSPITAL_COMMUNITY)
Admission: RE | Admit: 2016-01-26 | Discharge: 2016-01-26 | Disposition: A | Discharge status: Home / Self Care | Payer: Medicare Other | Source: Ambulatory Visit | Attending: Family Medicine | Admitting: Family Medicine

## 2016-01-26 DIAGNOSIS — Z1231 Encounter for screening mammogram for malignant neoplasm of breast: Secondary | ICD-10-CM | POA: Insufficient documentation

## 2016-01-29 ENCOUNTER — Other Ambulatory Visit: Payer: Self-pay | Admitting: Family Medicine

## 2016-01-29 DIAGNOSIS — R928 Other abnormal and inconclusive findings on diagnostic imaging of breast: Secondary | ICD-10-CM

## 2016-01-30 ENCOUNTER — Other Ambulatory Visit: Payer: Self-pay | Admitting: Family Medicine

## 2016-01-30 DIAGNOSIS — R928 Other abnormal and inconclusive findings on diagnostic imaging of breast: Secondary | ICD-10-CM

## 2016-02-06 ENCOUNTER — Other Ambulatory Visit: Payer: Self-pay | Admitting: Family Medicine

## 2016-02-06 DIAGNOSIS — R928 Other abnormal and inconclusive findings on diagnostic imaging of breast: Secondary | ICD-10-CM

## 2016-02-10 ENCOUNTER — Ambulatory Visit (HOSPITAL_COMMUNITY)
Admission: RE | Admit: 2016-02-10 | Discharge: 2016-02-10 | Disposition: A | Discharge status: Home / Self Care | Payer: Medicare Other | Source: Ambulatory Visit | Attending: Family Medicine | Admitting: Family Medicine

## 2016-02-10 ENCOUNTER — Other Ambulatory Visit: Payer: Self-pay

## 2016-02-10 DIAGNOSIS — R928 Other abnormal and inconclusive findings on diagnostic imaging of breast: Secondary | ICD-10-CM | POA: Diagnosis present

## 2016-02-10 DIAGNOSIS — R921 Mammographic calcification found on diagnostic imaging of breast: Secondary | ICD-10-CM

## 2016-02-21 ENCOUNTER — Other Ambulatory Visit: Payer: Self-pay | Admitting: Family Medicine

## 2016-02-21 DIAGNOSIS — R921 Mammographic calcification found on diagnostic imaging of breast: Secondary | ICD-10-CM

## 2016-03-02 ENCOUNTER — Other Ambulatory Visit: Payer: Self-pay | Admitting: Family Medicine

## 2016-03-03 ENCOUNTER — Other Ambulatory Visit: Payer: Self-pay

## 2016-03-03 DIAGNOSIS — R928 Other abnormal and inconclusive findings on diagnostic imaging of breast: Secondary | ICD-10-CM

## 2016-03-04 ENCOUNTER — Other Ambulatory Visit: Payer: Self-pay | Admitting: Family Medicine

## 2016-03-05 ENCOUNTER — Telehealth: Payer: Self-pay

## 2016-03-05 DIAGNOSIS — R921 Mammographic calcification found on diagnostic imaging of breast: Secondary | ICD-10-CM

## 2016-03-06 NOTE — Telephone Encounter (Signed)
Orders entered per radiology for breast biopsy

## 2016-03-12 ENCOUNTER — Ambulatory Visit
Admission: RE | Admit: 2016-03-12 | Discharge: 2016-03-12 | Disposition: A | Discharge status: Home / Self Care | Payer: Medicare Other | Source: Ambulatory Visit | Attending: Family Medicine | Admitting: Family Medicine

## 2016-03-12 DIAGNOSIS — R921 Mammographic calcification found on diagnostic imaging of breast: Secondary | ICD-10-CM

## 2016-03-12 DIAGNOSIS — N6489 Other specified disorders of breast: Secondary | ICD-10-CM | POA: Diagnosis not present

## 2016-03-12 DIAGNOSIS — D241 Benign neoplasm of right breast: Secondary | ICD-10-CM | POA: Diagnosis not present

## 2016-03-12 DIAGNOSIS — R928 Other abnormal and inconclusive findings on diagnostic imaging of breast: Secondary | ICD-10-CM | POA: Diagnosis not present

## 2016-03-12 DIAGNOSIS — N6310 Unspecified lump in the right breast, unspecified quadrant: Secondary | ICD-10-CM | POA: Diagnosis not present

## 2016-03-24 ENCOUNTER — Telehealth: Payer: Self-pay

## 2016-03-24 NOTE — Telephone Encounter (Signed)
Mailed out labs for patient to have done nov 22 but by then end of nov.   Attempted to reach patient by phone but number does not have a voicemail set up.

## 2016-04-20 ENCOUNTER — Ambulatory Visit (INDEPENDENT_AMBULATORY_CARE_PROVIDER_SITE_OTHER): Payer: Medicare Other

## 2016-04-20 VITALS — BP 136/74 | HR 64 | Temp 97.6°F | Resp 18 | Ht 67.0 in | Wt 229.1 lb

## 2016-04-20 DIAGNOSIS — E559 Vitamin D deficiency, unspecified: Secondary | ICD-10-CM

## 2016-04-20 DIAGNOSIS — E1069 Type 1 diabetes mellitus with other specified complication: Secondary | ICD-10-CM | POA: Diagnosis not present

## 2016-04-20 DIAGNOSIS — E1065 Type 1 diabetes mellitus with hyperglycemia: Secondary | ICD-10-CM | POA: Diagnosis not present

## 2016-04-20 DIAGNOSIS — E785 Hyperlipidemia, unspecified: Secondary | ICD-10-CM

## 2016-04-20 DIAGNOSIS — IMO0002 Reserved for concepts with insufficient information to code with codable children: Secondary | ICD-10-CM

## 2016-04-20 DIAGNOSIS — Z0000 Encounter for general adult medical examination without abnormal findings: Secondary | ICD-10-CM | POA: Diagnosis not present

## 2016-04-20 NOTE — Progress Notes (Signed)
Subjective:   Kathleen [name] is a 65 y.o. female who presents for Medicare Annual (Subsequent) preventive examination.  Review of Systems:  Cardiac Risk Factors include: advanced age (>58men, >57 women);diabetes mellitus;dyslipidemia;hypertension;obesity (BMI >30kg/m2);sedentary lifestyle     Objective:     Vitals: BP 136/74   Pulse 64   Temp 97.6 F (36.4 C) (Oral)   Resp 18   Ht 5\' 7"  (1.702 m)   Wt 229 lb 1.9 oz (103.9 kg)   SpO2 96%   BMI 35.89 kg/m   Body mass index is 35.89 kg/m.   Tobacco History  Smoking Status  . Never Smoker  Smokeless Tobacco  . Never Used     Counseling given: Not Answered   Past Medical History:  Diagnosis Date  . Anemia   . Diabetes mellitus type II   . Dysphagia    unspecified   . GERD (gastroesophageal reflux disease)   . Hyperlipidemia 2000  . Hypertension 1995  . Insomnia   . Low back pain   . Obesity    Past Surgical History:  Procedure Laterality Date  . Carpal tunnel release     left   . CHOLECYSTECTOMY  2007  . DIGIT NAIL REMOVAL  08/2011  . KNEE SURGERY  1.20.2012   arthroscopy LEFT knee partial medial meniscectomy  . TENOTOMY     2,3,4  left foot   . toenail removal    . TUBAL LIGATION     Family History  Problem Relation Age of Onset  . Diabetes Mother   . Hypertension Mother     MI  . Heart disease Mother   . Kidney disease Mother 64    dialysis  . Diabetes Sister   . Diabetes Brother   . Diabetes Brother   . Kidney disease Brother   . Dementia Sister   . Diabetes Father    History  Sexual Activity  . Sexual activity: Yes    Outpatient Encounter Prescriptions as of 04/20/2016  Medication Sig  . acetaminophen (TYLENOL ARTHRITIS PAIN) 650 MG CR tablet Take 650 mg by mouth 3 (three) times daily as needed. 1-2 by mouth three times a day as needed pain   . amLODipine (NORVASC) 10 MG tablet TAKE ONE TABLET BY MOUTH ONCE DAILY FOR BLOOD PRESSURE.  Marland Kitchen aspirin (ASPIRIN LOW DOSE) 81 MG tablet Take  81 mg by mouth at bedtime.   . B-D ULTRAFINE III SHORT PEN 31G X 8 MM MISC USE DAILY WITH LANTUS.  . cholecalciferol (VITAMIN D) 1000 UNITS tablet Take 1,000 Units by mouth daily.  . cloNIDine (CATAPRES) 0.3 MG tablet TAKE (1) TABLET BY MOUTH AT BEDTIME.  . fluticasone (FLONASE) 50 MCG/ACT nasal spray Place 2 sprays into both nostrils daily as needed.   Marland Kitchen glipiZIDE (GLIPIZIDE XL) 10 MG 24 hr tablet Take 1 tablet (10 mg total) by mouth daily with breakfast.  . ibuprofen (ADVIL,MOTRIN) 800 MG tablet Take 800 mg by mouth every 8 (eight) hours as needed for mild pain.  Marland Kitchen LANTUS SOLOSTAR 100 UNIT/ML Solostar Pen INJECT 90 UNITS INTO THE SKIN AT 10 PM.  . metFORMIN (GLUCOPHAGE) 1000 MG tablet TAKE 1 TABLET BY MOUTH ONCE DAILY  . POLY-IRON 150 150 MG capsule TAKE 1 CAPSULE BY MOUTH ONCE DAILY.  . pravastatin (PRAVACHOL) 20 MG tablet Take 1 tablet (20 mg total) by mouth daily.  . ramipril (ALTACE) 10 MG capsule TAKE (1) CAPSULE BY MOUTH ONCE DAILY.  Marland Kitchen spironolactone-hydrochlorothiazide (ALDACTAZIDE) 25-25 MG tablet TAKE ONE  TABLET BY MOUTH ONCE DAILY.  . [DISCONTINUED] terbinafine (LAMISIL) 250 MG tablet Take 1 tablet (250 mg total) by mouth daily. (Patient not taking: Reported on 04/20/2016)   No facility-administered encounter medications on file as of 04/20/2016.     Activities of Daily Living In your present state of health, do you have any difficulty performing the following activities: 04/20/2016  Hearing? N  Vision? N  Difficulty concentrating or making decisions? N  Walking or climbing stairs? Y  Dressing or bathing? N  Doing errands, shopping? N  Preparing Food and eating ? N  Using the Toilet? N  In the past six months, have you accidently leaked urine? N  Do you have problems with loss of bowel control? N  Managing your Medications? N  Managing your Finances? N  Housekeeping or managing your Housekeeping? N  Some recent data might be hidden    Patient Care Team: Fayrene Helper, MD as PCP - General    Assessment:    Exercise Activities and Dietary recommendations Current Exercise Habits: The patient does not participate in regular exercise at present;The patient has a physically strenous job, but has no regular exercise apart from work. (currently watching her 47 year old grandson 5 days a week)  Goals    . Reduce carbohydrate intake          Starting 04/20/2016 try to decrease amount of carbohydrates in your diet.      Fall Risk Fall Risk  04/20/2016 08/12/2015 04/15/2015 12/19/2014 08/08/2014  Falls in the past year? No No No No No  Risk for fall due to : Impaired vision - - - -   Depression Screen PHQ 2/9 Scores 04/20/2016 08/12/2015 08/08/2014 04/25/2013  PHQ - 2 Score 0 0 0 0  PHQ- 9 Score - 2 - -     Cognitive Function     6CIT Screen 04/20/2016  What Year? 0 points  What month? 0 points  What time? 0 points  Count back from 20 0 points  Months in reverse 0 points  Repeat phrase 0 points  Total Score 0    Immunization History  Administered Date(s) Administered  . Influenza Split 02/23/2011, 03/14/2012  . Influenza Whole 03/24/2007, 02/21/2008, 02/26/2010  . Influenza,inj,Quad PF,36+ Mos 04/23/2014, 04/15/2015, 12/29/2015  . Pneumococcal Conjugate-13 12/26/2013  . Pneumococcal Polysaccharide-23 11/29/2008, 08/12/2015  . Td 02/03/2009  . Zoster 10/28/2010   Screening Tests Health Maintenance  Topic Date Due  . OPHTHALMOLOGY EXAM  04/24/2015  . HEMOGLOBIN A1C  06/30/2016  . FOOT EXAM  12/28/2016  . MAMMOGRAM  02/09/2018  . TETANUS/TDAP  02/04/2019  . COLONOSCOPY  12/02/2019  . INFLUENZA VACCINE  Completed  . DEXA SCAN  Completed  . ZOSTAVAX  Completed  . Hepatitis C Screening  Completed  . PNA vac Low Risk Adult  Completed      Plan:  I have personally reviewed and addressed the Medicare Annual Wellness questionnaire and have noted the following in the patient's chart:  A. Medical and social history B. Use of alcohol,  tobacco or illicit drugs  C. Current medications and supplements D. Functional ability and status E.  Nutritional status F.  Physical activity G. Advance directives H. List of other physicians I.  Hospitalizations, surgeries, and ER visits in previous 12 months J.  [place] to include hearing, vision, cognitive, depression L. Referrals and appointments - none  In addition, I have reviewed and discussed with patient certain preventive protocols, quality metrics, and  best practice recommendations. A written personalized care plan for preventive services as well as general preventive health recommendations were provided to patient.   Signed,   Stormy Fabian, LPN Lead Nurse Health Advisor

## 2016-04-20 NOTE — Patient Instructions (Addendum)
Advance directive discussed with patient today. Copy provided for patient to complete at home and have notarized. Patient agrees to have copy sent to our office once it is complete.  Health maintenance: Due for diabetic eye exam please schedule your exam for as soon as possible. Lab work ordered today   Abnormal screenings: None   Patient concerns: None   Nurse concerns: Try to decrease amount of carbohydrates   Next PCP appt: in 4 months with Dr. Moshe Cipro, you will also have labs with this visit   Health Maintenance, Female Introduction Adopting a healthy lifestyle and getting preventive care can go a long way to promote health and wellness. Talk with your health care provider about what schedule of regular examinations is right for you. This is a good chance for you to check in with your provider about disease prevention and staying healthy. In between checkups, there are plenty of things you can do on your own. Experts have done a lot of research about which lifestyle changes and preventive measures are most likely to keep you healthy. Ask your health care provider for more information. Weight and diet Eat a healthy diet  Be sure to include plenty of vegetables, fruits, low-fat dairy products, and lean protein.  Do not eat a lot of foods high in solid fats, added sugars, or salt.  Get regular exercise. This is one of the most important things you can do for your health.  Most adults should exercise for at least 150 minutes each week. The exercise should increase your heart rate and make you sweat (moderate-intensity exercise).  Most adults should also do strengthening exercises at least twice a week. This is in addition to the moderate-intensity exercise. Maintain a healthy weight  Body mass index (BMI) is a measurement that can be used to identify possible weight problems. It estimates body fat based on height and weight. Your health care provider can help determine your BMI and  help you achieve or maintain a healthy weight.  For females 63 years of age and older:  A BMI below 18.5 is considered underweight.  A BMI of 18.5 to 24.9 is normal.  A BMI of 25 to 29.9 is considered overweight.  A BMI of 30 and above is considered obese. Watch levels of cholesterol and blood lipids  You should start having your blood tested for lipids and cholesterol at 67 years of age, then have this test every 5 years.  You may need to have your cholesterol levels checked more often if:  Your lipid or cholesterol levels are high.  You are older than 67 years of age.  You are at high risk for heart disease. Cancer screening Lung Cancer  Lung cancer screening is recommended for adults 45-36 years old who are at high risk for lung cancer because of a history of smoking.  A yearly low-dose CT scan of the lungs is recommended for people who:  Currently smoke.  Have quit within the past 15 years.  Have at least a 30-pack-year history of smoking. A pack year is smoking an average of one pack of cigarettes a day for 1 year.  Yearly screening should continue until it has been 15 years since you quit.  Yearly screening should stop if you develop a health problem that would prevent you from having lung cancer treatment. Breast Cancer  Practice breast self-awareness. This means understanding how your breasts normally appear and feel.  It also means doing regular breast self-exams. Let your health  care provider know about any changes, no matter how small.  If you are in your 20s or 30s, you should have a clinical breast exam (CBE) by a health care provider every 1-3 years as part of a regular health exam.  If you are 29 or older, have a CBE every year. Also consider having a breast X-ray (mammogram) every year.  If you have a family history of breast cancer, talk to your health care provider about genetic screening.  If you are at high risk for breast cancer, talk to your  health care provider about having an MRI and a mammogram every year.  Breast cancer gene (BRCA) assessment is recommended for women who have family members with BRCA-related cancers. BRCA-related cancers include:  Breast.  Ovarian.  Tubal.  Peritoneal cancers.  Results of the assessment will determine the need for genetic counseling and BRCA1 and BRCA2 testing. Cervical Cancer  Your health care provider may recommend that you be screened regularly for cancer of the pelvic organs (ovaries, uterus, and vagina). This screening involves a pelvic examination, including checking for microscopic changes to the surface of your cervix (Pap test). You may be encouraged to have this screening done every 3 years, beginning at age 59.  For women ages 57-65, health care providers may recommend pelvic exams and Pap testing every 3 years, or they may recommend the Pap and pelvic exam, combined with testing for human papilloma virus (HPV), every 5 years. Some types of HPV increase your risk of cervical cancer. Testing for HPV may also be done on women of any age with unclear Pap test results.  Other health care providers may not recommend any screening for nonpregnant women who are considered low risk for pelvic cancer and who do not have symptoms. Ask your health care provider if a screening pelvic exam is right for you.  If you have had past treatment for cervical cancer or a condition that could lead to cancer, you need Pap tests and screening for cancer for at least 20 years after your treatment. If Pap tests have been discontinued, your risk factors (such as having a new sexual partner) need to be reassessed to determine if screening should resume. Some women have medical problems that increase the chance of getting cervical cancer. In these cases, your health care provider may recommend more frequent screening and Pap tests. Colorectal Cancer  This type of cancer can be detected and often  prevented.  Routine colorectal cancer screening usually begins at 67 years of age and continues through 67 years of age.  Your health care provider may recommend screening at an earlier age if you have risk factors for colon cancer.  Your health care provider may also recommend using home test kits to check for hidden blood in the stool.  A small camera at the end of a tube can be used to examine your colon directly (sigmoidoscopy or colonoscopy). This is done to check for the earliest forms of colorectal cancer.  Routine screening usually begins at age 55.  Direct examination of the colon should be repeated every 5-10 years through 67 years of age. However, you may need to be screened more often if early forms of precancerous polyps or small growths are found. Skin Cancer  Check your skin from head to toe regularly.  Tell your health care provider about any new moles or changes in moles, especially if there is a change in a mole's shape or color.  Also tell your health  care provider if you have a mole that is larger than the size of a pencil eraser.  Always use sunscreen. Apply sunscreen liberally and repeatedly throughout the day.  Protect yourself by wearing long sleeves, pants, a wide-brimmed hat, and sunglasses whenever you are outside. Heart disease, diabetes, and high blood pressure  High blood pressure causes heart disease and increases the risk of stroke. High blood pressure is more likely to develop in:  People who have blood pressure in the high end of the normal range (130-139/85-89 mm Hg).  People who are overweight or obese.  People who are African American.  If you are 73-68 years of age, have your blood pressure checked every 3-5 years. If you are 85 years of age or older, have your blood pressure checked every year. You should have your blood pressure measured twice-once when you are at a hospital or clinic, and once when you are not at a hospital or clinic. Record  the average of the two measurements. To check your blood pressure when you are not at a hospital or clinic, you can use:  An automated blood pressure machine at a pharmacy.  A home blood pressure monitor.  If you are between 59 years and 55 years old, ask your health care provider if you should take aspirin to prevent strokes.  Have regular diabetes screenings. This involves taking a blood sample to check your fasting blood sugar level.  If you are at a normal weight and have a low risk for diabetes, have this test once every three years after 67 years of age.  If you are overweight and have a high risk for diabetes, consider being tested at a younger age or more often. Preventing infection Hepatitis B  If you have a higher risk for hepatitis B, you should be screened for this virus. You are considered at high risk for hepatitis B if:  You were born in a country where hepatitis B is common. Ask your health care provider which countries are considered high risk.  Your parents were born in a high-risk country, and you have not been immunized against hepatitis B (hepatitis B vaccine).  You have HIV or AIDS.  You use needles to inject street drugs.  You live with someone who has hepatitis B.  You have had sex with someone who has hepatitis B.  You get hemodialysis treatment.  You take certain medicines for conditions, including cancer, organ transplantation, and autoimmune conditions. Hepatitis C  Blood testing is recommended for:  Everyone born from [birth year] through 1967.  Anyone with known risk factors for hepatitis C. Sexually transmitted infections (STIs)  You should be screened for sexually transmitted infections (STIs) including gonorrhea and chlamydia if:  You are sexually active and are younger than 67 years of age.  You are older than 67 years of age and your health care provider tells you that you are at risk for this type of infection.  Your sexual activity has  changed since you were last screened and you are at an increased risk for chlamydia or gonorrhea. Ask your health care provider if you are at risk.  If you do not have HIV, but are at risk, it may be recommended that you take a prescription medicine daily to prevent HIV infection. This is called pre-exposure prophylaxis (PrEP). You are considered at risk if:  You are sexually active and do not regularly use condoms or know the HIV status of your partner(s).  You take drugs by  injection.  You are sexually active with a partner who has HIV. Talk with your health care provider about whether you are at high risk of being infected with HIV. If you choose to begin PrEP, you should first be tested for HIV. You should then be tested every 3 months for as long as you are taking PrEP. Pregnancy  If you are premenopausal and you may become pregnant, ask your health care provider about preconception counseling.  If you may become pregnant, take 400 to 800 micrograms (mcg) of folic acid every day.  If you want to prevent pregnancy, talk to your health care provider about birth control (contraception). Osteoporosis and menopause  Osteoporosis is a disease in which the bones lose minerals and strength with aging. This can result in serious bone fractures. Your risk for osteoporosis can be identified using a bone density scan.  If you are 51 years of age or older, or if you are at risk for osteoporosis and fractures, ask your health care provider if you should be screened.  Ask your health care provider whether you should take a calcium or vitamin D supplement to lower your risk for osteoporosis.  Menopause may have certain physical symptoms and risks.  Hormone replacement therapy may reduce some of these symptoms and risks. Talk to your health care provider about whether hormone replacement therapy is right for you. Follow these instructions at home:  Schedule regular health, dental, and eye  exams.  Stay current with your immunizations.  Do not use any tobacco products including cigarettes, chewing tobacco, or electronic cigarettes.  If you are pregnant, do not drink alcohol.  If you are breastfeeding, limit how much and how often you drink alcohol.  Limit alcohol intake to no more than 1 drink per day for nonpregnant women. One drink equals 12 ounces of beer, 5 ounces of wine, or 1 ounces of hard liquor.  Do not use street drugs.  Do not share needles.  Ask your health care provider for help if you need support or information about quitting drugs.  Tell your health care provider if you often feel depressed.  Tell your health care provider if you have ever been abused or do not feel safe at home. This information is not intended to replace advice given to you by your health care provider. Make sure you discuss any questions you have with your health care provider. Document Released: 11/09/2010 Document Revised: 10/02/2015 Document Reviewed: 01/28/2015  2017 [employer]

## 2016-04-21 LAB — COMPLETE METABOLIC PANEL WITH GFR
ALT: 11 U/L (ref 6–29)
AST: 12 U/L (ref 10–35)
Albumin: 4.1 g/dL (ref 3.6–5.1)
Alkaline Phosphatase: 80 U/L (ref 33–130)
BUN: 24 mg/dL (ref 7–25)
CALCIUM: 9.7 mg/dL (ref 8.6–10.4)
CHLORIDE: 102 mmol/L (ref 98–110)
CO2: 27 mmol/L (ref 20–31)
CREATININE: 1.32 mg/dL — AB (ref 0.50–0.99)
GFR, EST AFRICAN AMERICAN: 48 mL/min — AB (ref >=60)
GFR, Est Non African American: 42 mL/min — ABNORMAL LOW (ref >=60)
Glucose, Bld: 98 mg/dL (ref 65–99)
POTASSIUM: 4.8 mmol/L (ref 3.5–5.3)
Sodium: 136 mmol/L (ref 135–146)
Total Bilirubin: 0.4 mg/dL (ref 0.2–1.2)
Total Protein: 7.8 g/dL (ref 6.1–8.1)

## 2016-04-21 LAB — LIPID PANEL
CHOL/HDL RATIO: 4 ratio (ref <5.0)
Cholesterol: 170 mg/dL (ref <200)
HDL: 43 mg/dL — ABNORMAL LOW (ref >50)
LDL CALC: 99 mg/dL (ref <100)
Triglycerides: 141 mg/dL (ref <150)
VLDL: 28 mg/dL (ref <30)

## 2016-04-21 LAB — VITAMIN D 25 HYDROXY (VIT D DEFICIENCY, FRACTURES): VIT D 25 HYDROXY: 19 ng/mL — AB (ref 30–100)

## 2016-04-21 LAB — HEMOGLOBIN A1C
Hgb A1c MFr Bld: 8.4 % — ABNORMAL HIGH (ref <5.7)
Mean Plasma Glucose: 194 mg/dL

## 2016-04-28 ENCOUNTER — Other Ambulatory Visit: Payer: Self-pay

## 2016-04-29 ENCOUNTER — Other Ambulatory Visit: Payer: Self-pay | Admitting: Family Medicine

## 2016-05-04 ENCOUNTER — Other Ambulatory Visit: Payer: Self-pay

## 2016-05-04 ENCOUNTER — Telehealth: Payer: Self-pay | Admitting: Family Medicine

## 2016-05-04 DIAGNOSIS — E1065 Type 1 diabetes mellitus with hyperglycemia: Secondary | ICD-10-CM

## 2016-05-04 DIAGNOSIS — E1069 Type 1 diabetes mellitus with other specified complication: Principal | ICD-10-CM

## 2016-05-04 DIAGNOSIS — IMO0002 Reserved for concepts with insufficient information to code with codable children: Secondary | ICD-10-CM

## 2016-05-04 MED ORDER — INSULIN GLARGINE 100 UNIT/ML SOLOSTAR PEN: PEN_INJECTOR | SUBCUTANEOUS | 3 refills | Status: DC

## 2016-05-04 NOTE — Telephone Encounter (Signed)
pls mail letter to pt, re abn labs and meds , I only see failed tele attemps. Also needs fasting lipoid, cmp and EGFr and hBA1C with March 13 or shortly after follow up, both are uncontrolled. Her April f/u can be cancelled once this is made, pls get back to me re insulin dose after you are able to speak wit her , I will try to call today this morning also

## 2016-05-04 NOTE — Telephone Encounter (Signed)
Verified current meds, including 100 units lantus, new script to be sent by nurse  Test supplies to be delivered by pharmacy today , pt understands need to physically collect script in am if she does not get test supplies, reports not testing for 1  month1 Non fast hBA1C, chem 7 and EGFr for April visit tob be mailed to her, do not change appt date please, change in plan from earlier msg having spoken to pt

## 2016-05-14 NOTE — Addendum Note (Signed)
Addended by: Eual Fines on: 05/14/2016 02:03 PM   Modules accepted: Orders

## 2016-05-25 ENCOUNTER — Other Ambulatory Visit: Payer: Self-pay | Admitting: Family Medicine

## 2016-06-10 ENCOUNTER — Other Ambulatory Visit: Payer: Self-pay | Admitting: Family Medicine

## 2016-06-10 NOTE — Telephone Encounter (Signed)
See previous message

## 2016-06-16 ENCOUNTER — Other Ambulatory Visit: Payer: Self-pay | Admitting: Family Medicine

## 2016-08-11 ENCOUNTER — Other Ambulatory Visit: Payer: Self-pay | Admitting: Family Medicine

## 2016-08-19 ENCOUNTER — Encounter: Payer: Self-pay | Admitting: Family Medicine

## 2016-08-19 ENCOUNTER — Ambulatory Visit (INDEPENDENT_AMBULATORY_CARE_PROVIDER_SITE_OTHER): Payer: Medicare Other | Admitting: Family Medicine

## 2016-08-19 VITALS — BP 120/82 | HR 86 | Resp 16 | Ht 67.0 in | Wt 229.0 lb

## 2016-08-19 DIAGNOSIS — E1069 Type 1 diabetes mellitus with other specified complication: Secondary | ICD-10-CM

## 2016-08-19 DIAGNOSIS — I10 Essential (primary) hypertension: Secondary | ICD-10-CM | POA: Diagnosis not present

## 2016-08-19 DIAGNOSIS — E669 Obesity, unspecified: Secondary | ICD-10-CM | POA: Diagnosis not present

## 2016-08-19 DIAGNOSIS — IMO0002 Reserved for concepts with insufficient information to code with codable children: Secondary | ICD-10-CM

## 2016-08-19 DIAGNOSIS — E559 Vitamin D deficiency, unspecified: Secondary | ICD-10-CM | POA: Diagnosis not present

## 2016-08-19 DIAGNOSIS — J302 Other seasonal allergic rhinitis: Secondary | ICD-10-CM

## 2016-08-19 DIAGNOSIS — E785 Hyperlipidemia, unspecified: Secondary | ICD-10-CM | POA: Diagnosis not present

## 2016-08-19 DIAGNOSIS — E1065 Type 1 diabetes mellitus with hyperglycemia: Secondary | ICD-10-CM

## 2016-08-19 DIAGNOSIS — IMO0001 Reserved for inherently not codable concepts without codable children: Secondary | ICD-10-CM

## 2016-08-19 LAB — BASIC METABOLIC PANEL WITH GFR
BUN: 22 mg/dL (ref 7–25)
CALCIUM: 9.5 mg/dL (ref 8.6–10.4)
CO2: 24 mmol/L (ref 20–31)
Chloride: 102 mmol/L (ref 98–110)
Creat: 1.44 mg/dL — ABNORMAL HIGH (ref 0.50–0.99)
GFR, EST AFRICAN AMERICAN: 43 mL/min — AB (ref >=60)
GFR, EST NON AFRICAN AMERICAN: 38 mL/min — AB (ref >=60)
GLUCOSE: 85 mg/dL (ref 65–99)
POTASSIUM: 5 mmol/L (ref 3.5–5.3)
SODIUM: 138 mmol/L (ref 135–146)

## 2016-08-19 LAB — TSH: TSH: 1.44 m[IU]/L

## 2016-08-19 LAB — CBC
HCT: 35.2 % (ref 35.0–45.0)
Hemoglobin: 11 g/dL — ABNORMAL LOW (ref 11.7–15.5)
MCH: 25.6 pg — AB (ref 27.0–33.0)
MCHC: 31.3 g/dL — ABNORMAL LOW (ref 32.0–36.0)
MCV: 82.1 fL (ref 80.0–100.0)
MPV: 10.8 fL (ref 7.5–12.5)
PLATELETS: 290 10*3/uL (ref 140–400)
RBC: 4.29 MIL/uL (ref 3.80–5.10)
RDW: 14.8 % (ref 11.0–15.0)
WBC: 6.4 10*3/uL (ref 3.8–10.8)

## 2016-08-19 NOTE — Patient Instructions (Addendum)
Annual physical exam  Early Septemebr, call if you need me sooner  HBA1C, chem 7 and EGFR, CBC and tSH today  Fasting lipid, cmp and EGFR, HBA1c and vit D end August at least 3 days before Sept  Excellent blood pressure  Please work on good  health habits so that your health will improve. 1. Commitment to daily physical activity for 30 to 60  minutes, if you are able to do this.  2. Commitment to wise food choices. Aim for half of your  food intake to be vegetable and fruit, one quarter starchy foods, and one quarter protein. Try to eat on a regular schedule  3 meals per day, snacking between meals should be limited to vegetables or fruits or small portions of nuts. 64 ounces of water per day is generally recommended, unless you have specific health conditions, like heart failure or kidney failure where you will need to limit fluid intake.  3. Commitment to sufficient and a  good quality of physical and mental rest daily, generally between 6 to 8 hours per day.  WITH PERSISTANCE AND PERSEVERANCE, THE IMPOSSIBLE , BECOMES THE NORM!   It is important that you exercise regularly at least 30 minutes 5 times a week. If you develop chest pain, have severe difficulty breathing, or feel very tired, stop exercising immediately and seek medical attention   visit

## 2016-08-20 ENCOUNTER — Other Ambulatory Visit: Payer: Self-pay | Admitting: Family Medicine

## 2016-08-20 LAB — HEMOGLOBIN A1C
HEMOGLOBIN A1C: 9.1 % — AB (ref <5.7)
MEAN PLASMA GLUCOSE: 214 mg/dL

## 2016-08-20 NOTE — Assessment & Plan Note (Signed)
Unchanged. Patient re-educated about  the importance of commitment to a  minimum of 150 minutes of exercise per week.  The importance of healthy food choices with portion control discussed. Encouraged to start a food diary, count calories and to consider  joining a support group. Sample diet sheets offered. Goals set by the patient for the next several months.   Weight /BMI 08/19/2016 04/20/2016 12/29/2015  WEIGHT 229 lb 229 lb 1.9 oz 226 lb  HEIGHT 5\' 7"  5\' 7"  5\' 6"   BMI 35.87 kg/m2 35.89 kg/m2 36.48 kg/m2

## 2016-08-20 NOTE — Assessment & Plan Note (Signed)
Deteriorated, neds to take her diabetes more seriously Increase in glipizide dose offer diabetic ed Ms. Barnwell is reminded of the importance of commitment to daily physical activity for 30 minutes or more, as able and the need to limit carbohydrate intake to 30 to 60 grams per meal to help with blood sugar control.   The need to take medication as prescribed, test blood sugar as directed, and to call between visits if there is a concern that blood sugar is uncontrolled is also discussed.   Ms. Lipps is reminded of the importance of daily foot exam, annual eye examination, and good blood sugar, blood pressure and cholesterol control.  Diabetic Labs Latest Ref Rng & Units 08/19/2016 04/20/2016 12/29/2015 08/12/2015 04/15/2015  HbA1c <5.7 % 9.1(H) 8.4(H) 8.5(H) 9.2(H) 7.9(H)  Microalbumin Not Estab. ug/mL - - 3.1(H) - -  Micro/Creat Ratio 0.0 - 30.0 mg/g creat - - 3.3 - -  Chol <200 mg/dL - 170 147 168 -  HDL >50 mg/dL - 43(L) 45(L) 47 -  Calc LDL <100 mg/dL - 99 75 84 -  Triglycerides <150 mg/dL - 141 133 186(H) -  Creatinine 0.50 - 0.99 mg/dL 1.44(H) 1.32(H) 1.43(H) 1.28(H) 1.26(H)   BP/Weight 08/19/2016 04/20/2016 12/29/2015 08/12/2015 04/15/2015 12/19/2014 8/34/3735  Systolic BP 789 784 784 128 208 138 871  Diastolic BP 82 74 80 82 70 74 80  Wt. (Lbs) 229 229.12 226 225 227 227 228.08  BMI 35.87 35.89 36.48 36.33 36.66 36.66 36.83   Foot/eye exam completion dates Latest Ref Rng & Units 12/29/2015 12/19/2014  Eye Exam No Retinopathy - -  Foot Form Completion - Done Done   rept in 3 months

## 2016-08-20 NOTE — Assessment & Plan Note (Signed)
Hyperlipidemia:Low fat diet discussed and encouraged.   Lipid Panel  Lab Results  Component Value Date   CHOL 170 04/20/2016   HDL 43 (L) 04/20/2016   LDLCALC 99 04/20/2016   TRIG 141 04/20/2016   CHOLHDL 4.0 04/20/2016    Updated lab needed at/ before next visit. Need to commit to more regular exercise

## 2016-08-20 NOTE — Assessment & Plan Note (Signed)
Controlled, no change in medication DASH diet and commitment to daily physical activity for a minimum of 30 minutes discussed and encouraged, as a part of hypertension management. The importance of attaining a healthy weight is also discussed.  BP/Weight 08/19/2016 04/20/2016 12/29/2015 08/12/2015 04/15/2015 12/19/2014 3/35/4562  Systolic BP 563 893 734 287 681 157 262  Diastolic BP 82 74 80 82 70 74 80  Wt. (Lbs) 229 229.12 226 225 227 227 228.08  BMI 35.87 35.89 36.48 36.33 36.66 36.66 36.83

## 2016-08-20 NOTE — Assessment & Plan Note (Signed)
Controlled , no change in management 

## 2016-08-20 NOTE — Progress Notes (Signed)
Kathleen [name]     MRN: [id number]      DOB: 1950/06/25   HPI Ms. Serratore is here for follow up and re-evaluation of chronic medical conditions, medication management and review of any available recent lab and radiology data.  Preventive health is updated, specifically  Cancer screening and Immunization.   Questions or concerns regarding consultations or procedures which the PT has had in the interim are  addressed. The PT denies any adverse reactions to current medications since the last visit.  There are no new concerns.  There are no specific complaints  Denies polyuria, polydipsia, blurred vision , or hypoglycemic episodes.Not testing regularly   ROS Denies recent fever or chills. Denies sinus pressure, nasal congestion, ear pain or sore throat. Denies chest congestion, productive cough or wheezing. Denies chest pains, palpitations and leg swelling Denies abdominal pain, nausea, vomiting,diarrhea or constipation.   Denies dysuria, frequency, hesitancy or incontinence. Denies joint pain, swelling and limitation in mobility. Denies headaches, seizures, numbness, or tingling. Denies depression, anxiety or insomnia. Denies skin break down or rash.   PE  BP 120/82   Pulse 86   Resp 16   Ht 5\' 7"  (1.702 m)   Wt 229 lb (103.9 kg)   SpO2 97%   BMI 35.87 kg/m   Patient alert and oriented and in no cardiopulmonary distress.  HEENT: No facial asymmetry, EOMI,   oropharynx pink and moist.  Neck supple no JVD, no mass.  Chest: Clear to auscultation bilaterally.  CVS: S1, S2 no murmurs, no S3.Regular rate.  ABD: Soft non tender.   Ext: No edema  MS: Adequate ROM spine, shoulders, hips and knees.  Skin: Intact, no ulcerations or rash noted.  Psych: Good eye contact, normal affect. Memory intact not anxious or depressed appearing.  CNS: CN 2-12 intact, power,  normal throughout.no focal deficits noted.   Assessment & Plan  Essential hypertension Controlled, no  change in medication DASH diet and commitment to daily physical activity for a minimum of 30 minutes discussed and encouraged, as a part of hypertension management. The importance of attaining a healthy weight is also discussed.  BP/Weight 08/19/2016 04/20/2016 12/29/2015 08/12/2015 04/15/2015 12/19/2014 0/99/8338  Systolic BP 250 539 767 341 937 902 409  Diastolic BP 82 74 80 82 70 74 80  Wt. (Lbs) 229 229.12 226 225 227 227 228.08  BMI 35.87 35.89 36.48 36.33 36.66 36.66 36.83       Diabetes mellitus, insulin dependent (IDDM), uncontrolled Deteriorated, neds to take her diabetes more seriously Increase in glipizide dose offer diabetic ed Ms. Citron is reminded of the importance of commitment to daily physical activity for 30 minutes or more, as able and the need to limit carbohydrate intake to 30 to 60 grams per meal to help with blood sugar control.   The need to take medication as prescribed, test blood sugar as directed, and to call between visits if there is a concern that blood sugar is uncontrolled is also discussed.   Ms. Godette is reminded of the importance of daily foot exam, annual eye examination, and good blood sugar, blood pressure and cholesterol control.  Diabetic Labs Latest Ref Rng & Units 08/19/2016 04/20/2016 12/29/2015 08/12/2015 04/15/2015  HbA1c <5.7 % 9.1(H) 8.4(H) 8.5(H) 9.2(H) 7.9(H)  Microalbumin Not Estab. ug/mL - - 3.1(H) - -  Micro/Creat Ratio 0.0 - 30.0 mg/g creat - - 3.3 - -  Chol <200 mg/dL - 170 147 168 -  HDL >50 mg/dL - 43(L) 45(L)  47 -  Calc LDL <100 mg/dL - 99 75 84 -  Triglycerides <150 mg/dL - 141 133 186(H) -  Creatinine 0.50 - 0.99 mg/dL 1.44(H) 1.32(H) 1.43(H) 1.28(H) 1.26(H)   BP/Weight 08/19/2016 04/20/2016 12/29/2015 08/12/2015 04/15/2015 12/19/2014 10/14/3708  Systolic BP 626 948 546 270 350 093 818  Diastolic BP 82 74 80 82 70 74 80  Wt. (Lbs) 229 229.12 226 225 227 227 228.08  BMI 35.87 35.89 36.48 36.33 36.66 36.66 36.83   Foot/eye exam  completion dates Latest Ref Rng & Units 12/29/2015 12/19/2014  Eye Exam No Retinopathy - -  Foot Form Completion - Done Done   rept in 3 months     Hyperlipidemia LDL goal <100 Hyperlipidemia:Low fat diet discussed and encouraged.   Lipid Panel  Lab Results  Component Value Date   CHOL 170 04/20/2016   HDL 43 (L) 04/20/2016   LDLCALC 99 04/20/2016   TRIG 141 04/20/2016   CHOLHDL 4.0 04/20/2016    Updated lab needed at/ before next visit. Need to commit to more regular exercise   Obesity, Class II, BMI 35.0-39.9, with comorbidity (see actual BMI) Unchanged. Patient re-educated about  the importance of commitment to a  minimum of 150 minutes of exercise per week.  The importance of healthy food choices with portion control discussed. Encouraged to start a food diary, count calories and to consider  joining a support group. Sample diet sheets offered. Goals set by the patient for the next several months.   Weight /BMI 08/19/2016 04/20/2016 12/29/2015  WEIGHT 229 lb 229 lb 1.9 oz 226 lb  HEIGHT 5\' 7"  5\' 7"  5\' 6"   BMI 35.87 kg/m2 35.89 kg/m2 36.48 kg/m2      Seasonal allergies Controlled, no change in management

## 2016-08-23 ENCOUNTER — Other Ambulatory Visit: Payer: Self-pay

## 2016-08-23 MED ORDER — GLIPIZIDE ER 10 MG PO TB24: ORAL_TABLET | ORAL | 5 refills | Status: DC

## 2016-09-16 ENCOUNTER — Other Ambulatory Visit: Payer: Self-pay | Admitting: Family Medicine

## 2016-10-08 DIAGNOSIS — H35033 Hypertensive retinopathy, bilateral: Secondary | ICD-10-CM | POA: Diagnosis not present

## 2016-10-08 DIAGNOSIS — H25813 Combined forms of age-related cataract, bilateral: Secondary | ICD-10-CM | POA: Diagnosis not present

## 2016-10-08 DIAGNOSIS — I10 Essential (primary) hypertension: Secondary | ICD-10-CM | POA: Diagnosis not present

## 2016-10-08 LAB — HM DIABETES EYE EXAM

## 2016-11-25 ENCOUNTER — Other Ambulatory Visit: Payer: Self-pay | Admitting: Family Medicine

## 2016-12-08 ENCOUNTER — Other Ambulatory Visit: Payer: Self-pay | Admitting: Family Medicine

## 2017-01-06 DIAGNOSIS — E1065 Type 1 diabetes mellitus with hyperglycemia: Secondary | ICD-10-CM | POA: Diagnosis not present

## 2017-01-06 DIAGNOSIS — E559 Vitamin D deficiency, unspecified: Secondary | ICD-10-CM | POA: Diagnosis not present

## 2017-01-06 DIAGNOSIS — I10 Essential (primary) hypertension: Secondary | ICD-10-CM | POA: Diagnosis not present

## 2017-01-06 DIAGNOSIS — E785 Hyperlipidemia, unspecified: Secondary | ICD-10-CM | POA: Diagnosis not present

## 2017-01-06 DIAGNOSIS — E1069 Type 1 diabetes mellitus with other specified complication: Secondary | ICD-10-CM | POA: Diagnosis not present

## 2017-01-07 LAB — COMPLETE METABOLIC PANEL WITH GFR
ALBUMIN: 4 g/dL (ref 3.6–5.1)
ALK PHOS: 94 U/L (ref 33–130)
ALT: 13 U/L (ref 6–29)
AST: 10 U/L (ref 10–35)
BILIRUBIN TOTAL: 0.3 mg/dL (ref 0.2–1.2)
BUN: 18 mg/dL (ref 7–25)
CALCIUM: 9.4 mg/dL (ref 8.6–10.4)
CO2: 23 mmol/L (ref 20–32)
CREATININE: 1.38 mg/dL — AB (ref 0.50–0.99)
Chloride: 99 mmol/L (ref 98–110)
GFR, Est African American: 46 mL/min — ABNORMAL LOW (ref >=60)
GFR, Est Non African American: 40 mL/min — ABNORMAL LOW (ref >=60)
GLUCOSE: 237 mg/dL — AB (ref 65–99)
POTASSIUM: 5.2 mmol/L (ref 3.5–5.3)
SODIUM: 133 mmol/L — AB (ref 135–146)
Total Protein: 7.4 g/dL (ref 6.1–8.1)

## 2017-01-07 LAB — LIPID PANEL
CHOL/HDL RATIO: 4.7 ratio (ref <5.0)
CHOLESTEROL: 205 mg/dL — AB (ref <200)
HDL: 44 mg/dL — ABNORMAL LOW (ref >50)
LDL Cholesterol: 121 mg/dL — ABNORMAL HIGH (ref <100)
Triglycerides: 202 mg/dL — ABNORMAL HIGH (ref <150)
VLDL: 40 mg/dL — ABNORMAL HIGH (ref <30)

## 2017-01-07 LAB — HEMOGLOBIN A1C
HEMOGLOBIN A1C: 9.5 % — AB (ref <5.7)
Mean Plasma Glucose: 226 mg/dL

## 2017-01-07 LAB — VITAMIN D 25 HYDROXY (VIT D DEFICIENCY, FRACTURES): Vit D, 25-Hydroxy: 18 ng/mL — ABNORMAL LOW (ref 30–100)

## 2017-01-20 ENCOUNTER — Encounter (INDEPENDENT_AMBULATORY_CARE_PROVIDER_SITE_OTHER): Payer: Self-pay

## 2017-01-20 ENCOUNTER — Encounter: Payer: Self-pay | Admitting: Family Medicine

## 2017-01-20 ENCOUNTER — Ambulatory Visit (INDEPENDENT_AMBULATORY_CARE_PROVIDER_SITE_OTHER): Payer: Medicare Other | Admitting: Family Medicine

## 2017-01-20 VITALS — BP 120/70 | HR 86 | Temp 98.8°F | Ht 65.0 in | Wt 223.0 lb

## 2017-01-20 DIAGNOSIS — E1022 Type 1 diabetes mellitus with diabetic chronic kidney disease: Secondary | ICD-10-CM

## 2017-01-20 DIAGNOSIS — E1065 Type 1 diabetes mellitus with hyperglycemia: Secondary | ICD-10-CM

## 2017-01-20 DIAGNOSIS — E785 Hyperlipidemia, unspecified: Secondary | ICD-10-CM

## 2017-01-20 DIAGNOSIS — Z7189 Other specified counseling: Secondary | ICD-10-CM | POA: Diagnosis not present

## 2017-01-20 DIAGNOSIS — Z1231 Encounter for screening mammogram for malignant neoplasm of breast: Secondary | ICD-10-CM

## 2017-01-20 DIAGNOSIS — Z1211 Encounter for screening for malignant neoplasm of colon: Secondary | ICD-10-CM | POA: Diagnosis not present

## 2017-01-20 DIAGNOSIS — Z0000 Encounter for general adult medical examination without abnormal findings: Secondary | ICD-10-CM | POA: Diagnosis not present

## 2017-01-20 LAB — HEMOCCULT GUIAC POC 1CARD (OFFICE): Fecal Occult Blood, POC: NEGATIVE

## 2017-01-20 LAB — GLUCOSE, POCT (MANUAL RESULT ENTRY): POC GLUCOSE: 155 mg/dL — AB (ref 70–99)

## 2017-01-20 MED ORDER — PRAVASTATIN SODIUM 20 MG PO TABS: 20.0000 mg | ORAL_TABLET | Freq: Every day | ORAL | 3 refills | Status: DC

## 2017-01-20 MED ORDER — METFORMIN HCL 1000 MG PO TABS: 1000.0000 mg | ORAL_TABLET | Freq: Every day | ORAL | 3 refills | Status: DC

## 2017-01-20 NOTE — Patient Instructions (Signed)
F/u Week of October 8, you neeed to test and record blood sugar 3 times daily  Before breakfast , sugar range is 90 to 130  2 hours after lunch range is 130 to 180, and  Bedtime range iis 130 to 180   Bring record to visit  New for cholesterol is pravacol 20 mg one at night  Blood sugar medicines are glipizide,, metformin and lantus 1000 units every night  Thank you  for choosing [facility]. We consider it a privelige to serve you.  Delivering excellent health care in a caring and  compassionate way is our goal.  Partnering with you,  so that together we can achieve this goal is our strategy.

## 2017-01-22 NOTE — Assessment & Plan Note (Signed)
Hyperlipidemia:Low fat diet discussed and encouraged.   Lipid Panel  Lab Results  Component Value Date   CHOL 205 (H) 01/06/2017   HDL 44 (L) 01/06/2017   LDLCALC 121 (H) 01/06/2017   TRIG 202 (H) 01/06/2017   CHOLHDL 4.7 01/06/2017   Not at goal , start pravastatin

## 2017-01-22 NOTE — Assessment & Plan Note (Signed)
Annual exam as documented. Counseling done  re healthy lifestyle involving commitment to 150 minutes exercise per week, heart healthy diet, and attaining healthy weight.The importance of adequate sleep also discussed.  Immunization and cancer screening needs are specifically addressed at this visit.  

## 2017-01-22 NOTE — Progress Notes (Signed)
Kathleen [name]     MRN: [id number]      DOB: 1951-02-09  HPI: Patient is in for annual physical exam. Uncontrolled diabetes is discussed and education done , also uncontrolled and untreated hyperlipidemia, pt education re medications done Recent labs, if available are reviewed. Immunization is reviewed , and  Needs to be  updated    PE: BP 120/70   Pulse 86   Temp 98.8 F (37.1 C) (Oral)   Ht 5\' 5"  (1.651 m)   Wt 223 lb (101.2 kg)   SpO2 93%   BMI 37.11 kg/m   Pleasant  female, alert and oriented x 3, in no cardio-pulmonary distress. Afebrile. HEENT No facial trauma or asymetry. Sinuses non tender.  Extra occullar muscles intact, pupils equally reactive to light. External ears normal, tympanic membranes clear. Oropharynx moist, no exudate. Neck: supple, no adenopathy,JVD or thyromegaly.No bruits.  Chest: Clear to ascultation bilaterally.No crackles or wheezes. Non tender to palpation  Breast: No asymetry,no masses or lumps. No tenderness. No nipple discharge or inversion. No axillary or supraclavicular adenopathy  Cardiovascular system; Heart sounds normal,  S1 and  S2 ,no S3.  No murmur, or thrill. Apical beat not displaced Peripheral pulses normal.  Abdomen: Soft, non tender, no organomegaly or masses. No bruits. Bowel sounds normal. No guarding, tenderness or rebound.  Rectal:  Normal sphincter tone. No rectal mass. Guaiac negative stool.  GU: Not examined   Musculoskeletal exam: Full ROM of spine, hips , shoulders and knees. No deformity ,swelling or crepitus noted. No muscle wasting or atrophy.   Neurologic: Cranial nerves 2 to 12 intact. Power, tone ,sensation and reflexes normal throughout. No disturbance in gait. No tremor.  Skin: Intact, no ulceration, erythema , scaling or rash noted. Pigmentation normal throughout  Psych; Normal mood and affect. Judgement and concentration normal   Assessment & Plan:  Annual physical  exam Annual exam as documented. Counseling done  re healthy lifestyle involving commitment to 150 minutes exercise per week, heart healthy diet, and attaining healthy weight.The importance of adequate sleep also discussed.  Immunization and cancer screening needs are specifically addressed at this visit.   Diabetes mellitus, insulin dependent (IDDM), uncontrolled Uncontrolled , non compliant with medication, re educated and medications reviewed, has been taking glipizide one daily instead of 2 daily Kathleen [name] is reminded of the importance of commitment to daily physical activity for 30 minutes or more, as able and the need to limit carbohydrate intake to 30 to 60 grams per meal to help with blood sugar control.   The need to take medication as prescribed, test blood sugar as directed, and to call between visits if there is a concern that blood sugar is uncontrolled is also discussed.   Kathleen [name] is reminded of the importance of daily foot exam, annual eye examination, and good blood sugar, blood pressure and cholesterol control.  Diabetic Labs Latest Ref Rng & Units 01/06/2017 08/19/2016 04/20/2016 12/29/2015 08/12/2015  HbA1c <5.7 % 9.5(H) 9.1(H) 8.4(H) 8.5(H) 9.2(H)  Microalbumin Not Estab. ug/mL - - - 3.1(H) -  Micro/Creat Ratio 0.0 - 30.0 mg/g creat - - - 3.3 -  Chol <200 mg/dL 205(H) - 170 147 168  HDL >50 mg/dL 44(L) - 43(L) 45(L) 47  Calc LDL <100 mg/dL 121(H) - 99 75 84  Triglycerides <150 mg/dL 202(H) - 141 133 186(H)  Creatinine 0.50 - 0.99 mg/dL 1.38(H) 1.44(H) 1.32(H) 1.43(H) 1.28(H)   BP/Weight 01/20/2017 08/19/2016 04/20/2016 12/29/2015 08/12/2015 04/15/2015 3/81/8299  Systolic  BP 120 120 136 120 540 086 761  Diastolic BP 70 82 74 80 82 70 74  Wt. (Lbs) 223 229 229.12 226 225 227 227  BMI 37.11 35.87 35.89 36.48 36.33 36.66 36.66   Foot/eye exam completion dates Latest Ref Rng & Units 01/20/2017 10/08/2016  Eye Exam No Retinopathy - No Retinopathy  Foot Form Completion - Done -         Hyperlipidemia LDL goal <100 Hyperlipidemia:Low fat diet discussed and encouraged.   Lipid Panel  Lab Results  Component Value Date   CHOL 205 (H) 01/06/2017   HDL 44 (L) 01/06/2017   LDLCALC 121 (H) 01/06/2017   TRIG 202 (H) 01/06/2017   CHOLHDL 4.7 01/06/2017   Not at goal , start pravastatin

## 2017-01-22 NOTE — Assessment & Plan Note (Signed)
Uncontrolled , non compliant with medication, re educated and medications reviewed, has been taking glipizide one daily instead of 2 daily Kathleen [name] is reminded of the importance of commitment to daily physical activity for 30 minutes or more, as able and the need to limit carbohydrate intake to 30 to 60 grams per meal to help with blood sugar control.   The need to take medication as prescribed, test blood sugar as directed, and to call between visits if there is a concern that blood sugar is uncontrolled is also discussed.   Kathleen [name] is reminded of the importance of daily foot exam, annual eye examination, and good blood sugar, blood pressure and cholesterol control.  Diabetic Labs Latest Ref Rng & Units 01/06/2017 08/19/2016 04/20/2016 12/29/2015 08/12/2015  HbA1c <5.7 % 9.5(H) 9.1(H) 8.4(H) 8.5(H) 9.2(H)  Microalbumin Not Estab. ug/mL - - - 3.1(H) -  Micro/Creat Ratio 0.0 - 30.0 mg/g creat - - - 3.3 -  Chol <200 mg/dL 205(H) - 170 147 168  HDL >50 mg/dL 44(L) - 43(L) 45(L) 47  Calc LDL <100 mg/dL 121(H) - 99 75 84  Triglycerides <150 mg/dL 202(H) - 141 133 186(H)  Creatinine 0.50 - 0.99 mg/dL 1.38(H) 1.44(H) 1.32(H) 1.43(H) 1.28(H)   BP/Weight 01/20/2017 08/19/2016 04/20/2016 12/29/2015 08/12/2015 04/15/2015 5/79/7282  Systolic BP 060 156 153 794 327 614 709  Diastolic BP 70 82 74 80 82 70 74  Wt. (Lbs) 223 229 229.12 226 225 227 227  BMI 37.11 35.87 35.89 36.48 36.33 36.66 36.66   Foot/eye exam completion dates Latest Ref Rng & Units 01/20/2017 10/08/2016  Eye Exam No Retinopathy - No Retinopathy  Foot Form Completion - Done -

## 2017-02-14 ENCOUNTER — Ambulatory Visit (INDEPENDENT_AMBULATORY_CARE_PROVIDER_SITE_OTHER): Payer: Medicare Other | Admitting: Family Medicine

## 2017-02-14 ENCOUNTER — Encounter: Payer: Self-pay | Admitting: Family Medicine

## 2017-02-14 VITALS — BP 136/64 | HR 94 | Temp 98.6°F | Resp 16 | Ht 65.0 in | Wt 230.5 lb

## 2017-02-14 DIAGNOSIS — E785 Hyperlipidemia, unspecified: Secondary | ICD-10-CM

## 2017-02-14 DIAGNOSIS — Z23 Encounter for immunization: Secondary | ICD-10-CM | POA: Diagnosis not present

## 2017-02-14 DIAGNOSIS — E10649 Type 1 diabetes mellitus with hypoglycemia without coma: Secondary | ICD-10-CM

## 2017-02-14 DIAGNOSIS — I10 Essential (primary) hypertension: Secondary | ICD-10-CM | POA: Diagnosis not present

## 2017-02-14 LAB — GLUCOSE, POCT (MANUAL RESULT ENTRY): POC GLUCOSE: 66 mg/dL — AB (ref 70–99)

## 2017-02-14 NOTE — Assessment & Plan Note (Signed)
Kathleen [name] is reminded of the importance of commitment to daily physical activity for 30 minutes or more, as able and the need to limit carbohydrate intake to 30 to 60 grams per meal to help with blood sugar control.   The need to take medication as prescribed, test blood sugar as directed, and to call between visits if there is a concern that blood sugar is uncontrolled is also discussed.   Kathleen [name] is reminded of the importance of daily foot exam, annual eye examination, and good blood sugar, blood pressure and cholesterol control.  Diabetic Labs Latest Ref Rng & Units 01/06/2017 08/19/2016 04/20/2016 12/29/2015 08/12/2015  HbA1c <5.7 % 9.5(H) 9.1(H) 8.4(H) 8.5(H) 9.2(H)  Microalbumin Not Estab. ug/mL - - - 3.1(H) -  Micro/Creat Ratio 0.0 - 30.0 mg/g creat - - - 3.3 -  Chol <200 mg/dL 205(H) - 170 147 168  HDL >50 mg/dL 44(L) - 43(L) 45(L) 47  Calc LDL <100 mg/dL 121(H) - 99 75 84  Triglycerides <150 mg/dL 202(H) - 141 133 186(H)  Creatinine 0.50 - 0.99 mg/dL 1.38(H) 1.44(H) 1.32(H) 1.43(H) 1.28(H)   BP/Weight 02/14/2017 01/20/2017 08/19/2016 04/20/2016 12/29/2015 08/12/2015 56/07/1495  Systolic BP 026 378 588 502 774 128 786  Diastolic BP 64 70 82 74 80 82 70  Wt. (Lbs) 230.5 223 229 229.12 226 225 227  BMI 38.36 37.11 35.87 35.89 36.48 36.33 36.66   Foot/eye exam completion dates Latest Ref Rng & Units 01/20/2017 10/08/2016  Eye Exam No Retinopathy - No Retinopathy  Foot Form Completion - Done -   Deteriorated, needs to work at improving blood sugar, resists endo referral at this time but will need to see specialist if blood sugar remains uncontrolled rept in December

## 2017-02-14 NOTE — Assessment & Plan Note (Signed)
Controlled, no change in medication DASH diet and commitment to daily physical activity for a minimum of 30 minutes discussed and encouraged, as a part of hypertension management. The importance of attaining a healthy weight is also discussed.  BP/Weight 02/14/2017 01/20/2017 08/19/2016 04/20/2016 12/29/2015 08/12/2015 06/16/7410  Systolic BP 878 676 720 947 096 283 662  Diastolic BP 64 70 82 74 80 82 70  Wt. (Lbs) 230.5 223 229 229.12 226 225 227  BMI 38.36 37.11 35.87 35.89 36.48 36.33 36.66

## 2017-02-14 NOTE — Assessment & Plan Note (Signed)
Deteriorated. Patient re-educated about  the importance of commitment to a  minimum of 150 minutes of exercise per week.  The importance of healthy food choices with portion control discussed. Encouraged to start a food diary, count calories and to consider  joining a support group. Sample diet sheets offered. Goals set by the patient for the next several months.   Weight /BMI 02/14/2017 01/20/2017 08/19/2016  WEIGHT 230 lb 8 oz 223 lb 229 lb  HEIGHT 5\' 5"  5\' 5"  5\' 7"   BMI 38.36 kg/m2 37.11 kg/m2 35.87 kg/m2

## 2017-02-14 NOTE — Patient Instructions (Addendum)
F/u 2 nd  Week in December, call if you need me before  Wellness with nurse due Dec 13 or after please schedule on same day asMD visit if possible   Flu vaccine today   Test and record blood pressure two times daily at least  Fasting blood sugar needs to be between 80 to 130  Bedtime blood sugar needs to be 130 to 180   NEED to eat on a set time schedule so that your blood sugar stays ion a safe cobntroled range  Lipid, cmp and EGFR and hBA1C fasting first  week in December  It is important that you exercise regularly at least 30 minutes 5 times a week. If you develop chest pain, have severe difficulty breathing, or feel very tired, stop exercising immediately and seek medical attention

## 2017-02-14 NOTE — Progress Notes (Signed)
Kathleen [name]     MRN: [id number]      DOB: 1950/07/01   HPI Kathleen [name] is here for follow up and re-evaluation of chronic medical conditions, medication management and review of any available recent lab and radiology data.  Preventive health is updated, specifically  Cancer screening and Immunization.   Questions or concerns regarding consultations or procedures which the PT has had in the interim are  addressed. The PT denies any adverse reactions to current medications since the last visit.  There are no new concerns.  There are no specific complaints  FBG ranges from 68 to 170, not regularlydoing bedtime testing, re educated re the need t do so. Denies polyuria, polydipsia, blurred vision , or hypoglycemic episodes.  ROS Denies recent fever or chills. Denies sinus pressure, nasal congestion, ear pain or sore throat. Denies chest congestion, productive cough or wheezing. Denies chest pains, palpitations and leg swelling Denies abdominal pain, nausea, vomiting,diarrhea or constipation.   Denies dysuria, frequency, hesitancy or incontinence. Denies joint pain, swelling and limitation in mobility. Denies headaches, seizures, numbness, or tingling. Denies depression, anxiety or insomnia. Denies skin break down or rash.   PE  BP 136/64 (BP Location: Left Arm, Patient Position: Sitting, Cuff Size: Normal)   Pulse 94   Temp 98.6 F (37 C) (Other (Comment))   Resp 16   Ht 5\' 5"  (1.651 m)   Wt 230 lb 8 oz (104.6 kg)   SpO2 97%   BMI 38.36 kg/m   Patient alert and oriented and in no cardiopulmonary distress.  HEENT: No facial asymmetry, EOMI,   oropharynx pink and moist.  Neck supple no JVD, no mass.  Chest: Clear to auscultation bilaterally.  CVS: S1, S2 no murmurs, no S3.Regular rate.  ABD: Soft non tender.   Ext: No edema  MS: Adequate ROM spine, shoulders, hips and knees.  Skin: Intact, no ulcerations or rash noted.  Psych: Good eye contact, normal affect.  Memory intact not anxious or depressed appearing.  CNS: CN 2-12 intact, power,  normal throughout.no focal deficits noted.   Assessment & Plan  Essential hypertension Controlled, no change in medication DASH diet and commitment to daily physical activity for a minimum of 30 minutes discussed and encouraged, as a part of hypertension management. The importance of attaining a healthy weight is also discussed.  BP/Weight 02/14/2017 01/20/2017 08/19/2016 04/20/2016 12/29/2015 08/12/2015 02/09/5851  Systolic BP 778 242 353 614 431 540 086  Diastolic BP 64 70 82 74 80 82 70  Wt. (Lbs) 230.5 223 229 229.12 226 225 227  BMI 38.36 37.11 35.87 35.89 36.48 36.33 36.66       Hyperlipidemia LDL goal <100 Hyperlipidemia:Low fat diet discussed and encouraged.   Lipid Panel  Lab Results  Component Value Date   CHOL 205 (H) 01/06/2017   HDL 44 (L) 01/06/2017   LDLCALC 121 (H) 01/06/2017   TRIG 202 (H) 01/06/2017   CHOLHDL 4.7 01/06/2017  uncontrolled Updated lab needed at/ before next visit.      Morbid obesity ([place]) Deteriorated. Patient re-educated about  the importance of commitment to a  minimum of 150 minutes of exercise per week.  The importance of healthy food choices with portion control discussed. Encouraged to start a food diary, count calories and to consider  joining a support group. Sample diet sheets offered. Goals set by the patient for the next several months.   Weight /BMI 02/14/2017 01/20/2017 08/19/2016  WEIGHT 230 lb 8 oz 223 lb  229 lb  HEIGHT 5\' 5"  5\' 5"  5\' 7"   BMI 38.36 kg/m2 37.11 kg/m2 35.87 kg/m2      Diabetes mellitus, insulin dependent (IDDM), uncontrolled Kathleen [name] is reminded of the importance of commitment to daily physical activity for 30 minutes or more, as able and the need to limit carbohydrate intake to 30 to 60 grams per meal to help with blood sugar control.   The need to take medication as prescribed, test blood sugar as directed, and to call  between visits if there is a concern that blood sugar is uncontrolled is also discussed.   Kathleen [name] is reminded of the importance of daily foot exam, annual eye examination, and good blood sugar, blood pressure and cholesterol control.  Diabetic Labs Latest Ref Rng & Units 01/06/2017 08/19/2016 04/20/2016 12/29/2015 08/12/2015  HbA1c <5.7 % 9.5(H) 9.1(H) 8.4(H) 8.5(H) 9.2(H)  Microalbumin Not Estab. ug/mL - - - 3.1(H) -  Micro/Creat Ratio 0.0 - 30.0 mg/g creat - - - 3.3 -  Chol <200 mg/dL 205(H) - 170 147 168  HDL >50 mg/dL 44(L) - 43(L) 45(L) 47  Calc LDL <100 mg/dL 121(H) - 99 75 84  Triglycerides <150 mg/dL 202(H) - 141 133 186(H)  Creatinine 0.50 - 0.99 mg/dL 1.38(H) 1.44(H) 1.32(H) 1.43(H) 1.28(H)   BP/Weight 02/14/2017 01/20/2017 08/19/2016 04/20/2016 12/29/2015 08/12/2015 78/10/7670  Systolic BP 094 709 628 366 294 765 465  Diastolic BP 64 70 82 74 80 82 70  Wt. (Lbs) 230.5 223 229 229.12 226 225 227  BMI 38.36 37.11 35.87 35.89 36.48 36.33 36.66   Foot/eye exam completion dates Latest Ref Rng & Units 01/20/2017 10/08/2016  Eye Exam No Retinopathy - No Retinopathy  Foot Form Completion - Done -   Deteriorated, needs to work at improving blood sugar, resists endo referral at this time but will need to see specialist if blood sugar remains uncontrolled rept in December

## 2017-02-14 NOTE — Assessment & Plan Note (Signed)
Hyperlipidemia:Low fat diet discussed and encouraged.   Lipid Panel  Lab Results  Component Value Date   CHOL 205 (H) 01/06/2017   HDL 44 (L) 01/06/2017   LDLCALC 121 (H) 01/06/2017   TRIG 202 (H) 01/06/2017   CHOLHDL 4.7 01/06/2017  uncontrolled Updated lab needed at/ before next visit.

## 2017-02-19 ENCOUNTER — Other Ambulatory Visit: Payer: Self-pay | Admitting: Family Medicine

## 2017-02-21 NOTE — Telephone Encounter (Signed)
Seen 10 8 18

## 2017-03-11 ENCOUNTER — Other Ambulatory Visit: Payer: Self-pay | Admitting: Family Medicine

## 2017-04-07 ENCOUNTER — Other Ambulatory Visit: Payer: Self-pay | Admitting: Family Medicine

## 2017-04-07 NOTE — Telephone Encounter (Signed)
Seen 10 8 18

## 2017-04-27 ENCOUNTER — Ambulatory Visit: Payer: Medicare Other

## 2017-04-27 ENCOUNTER — Telehealth: Payer: Self-pay

## 2017-04-27 ENCOUNTER — Ambulatory Visit: Payer: Medicare Other | Admitting: Family Medicine

## 2017-04-27 VITALS — BP 138/68 | HR 100 | Temp 98.1°F | Resp 18 | Ht 65.0 in | Wt 232.0 lb

## 2017-04-27 DIAGNOSIS — I10 Essential (primary) hypertension: Secondary | ICD-10-CM | POA: Diagnosis not present

## 2017-04-27 DIAGNOSIS — Z0000 Encounter for general adult medical examination without abnormal findings: Secondary | ICD-10-CM

## 2017-04-27 DIAGNOSIS — E10649 Type 1 diabetes mellitus with hypoglycemia without coma: Secondary | ICD-10-CM | POA: Diagnosis not present

## 2017-04-27 DIAGNOSIS — E785 Hyperlipidemia, unspecified: Secondary | ICD-10-CM | POA: Diagnosis not present

## 2017-04-27 NOTE — Progress Notes (Signed)
Subjective:   Kathleen [name] is a 66 y.o. female who presents for Medicare Annual (Subsequent) preventive examination.  Review of Systems:   Cardiac Risk Factors include: hypertension;diabetes mellitus     Objective:     Vitals: BP 138/68 (BP Location: Right Arm, Patient Position: Sitting, Cuff Size: Large)   Pulse 100   Temp 98.1 F (36.7 C) (Temporal)   Resp 18   Ht 5\' 5"  (1.651 m)   Wt 232 lb 0.6 oz (105.3 kg)   SpO2 97%   BMI 38.61 kg/m   Body mass index is 38.61 kg/m.  Advanced Directives 04/27/2017 01/20/2017 04/20/2016  Does Patient Have a Medical Advance Directive? No No No  Would patient like information on creating a medical advance directive? No - Patient declined - Yes (ED - Information included in AVS)    Tobacco Social History   Tobacco Use  Smoking Status Never Smoker  Smokeless Tobacco Never Used     Counseling given: Not Answered   Clinical Intake:     Pain : No/denies pain Pain Score: 0-No pain     Diabetes: Yes CBG done?: No Did pt. bring in CBG monitor from home?: No  How often do you need to have someone help you when you read instructions, pamphlets, or other written materials from your doctor or pharmacy?: 1 - Never What is the last grade level you completed in school?: 11th  Interpreter Needed?: No     Past Medical History:  Diagnosis Date  . Anemia   . Arthritis   . Chronic kidney disease   . Diabetes mellitus type II   . Dysphagia    unspecified   . GERD (gastroesophageal reflux disease)   . Hyperlipidemia 2000  . Hypertension 1995  . Insomnia   . Low back pain   . Obesity    Past Surgical History:  Procedure Laterality Date  . Carpal tunnel release     left   . CHOLECYSTECTOMY  2007  . DIGIT NAIL REMOVAL  08/2011  . KNEE SURGERY  1.20.2012   arthroscopy LEFT knee partial medial meniscectomy  . TENOTOMY     2,3,4  left foot   . toenail removal    . TUBAL LIGATION     Family History  Problem Relation  Age of Onset  . Diabetes Mother   . Hypertension Mother        MI  . Heart disease Mother   . Kidney disease Mother 15       dialysis  . Diabetes Sister   . Diabetes Brother   . Diabetes Brother   . Kidney disease Brother   . Dementia Sister   . Diabetes Father    Social History   Socioeconomic History  . Marital status: Divorced    Spouse name: None  . Number of children: 4  . Years of education: None  . Highest education level: 11th grade  Social Needs  . Financial resource strain: Not hard at all  . Food insecurity - worry: Never true  . Food insecurity - inability: Never true  . Transportation needs - medical: No  . Transportation needs - non-medical: No  Occupational History  . Occupation: Full time Engineer, petroleum   Tobacco Use  . Smoking status: Never Smoker  . Smokeless tobacco: Never Used  Substance and Sexual Activity  . Alcohol use: No  . Drug use: No  . Sexual activity: Yes  Other Topics Concern  . None  Social History  Narrative   Lives with 2nd oldest son    Outpatient Encounter Medications as of 04/27/2017  Medication Sig  . acetaminophen (TYLENOL ARTHRITIS PAIN) 650 MG CR tablet Take 650 mg by mouth 3 (three) times daily as needed. 1-2 by mouth three times a day as needed pain   . amLODipine (NORVASC) 10 MG tablet TAKE ONE TABLET BY MOUTH ONCE DAILY FOR BLOOD PRESSURE.  Marland Kitchen aspirin (ASPIRIN LOW DOSE) 81 MG tablet Take 81 mg by mouth at bedtime.   . B-D ULTRAFINE III SHORT PEN 31G X 8 MM MISC USE DAILY WITH LANTUS.  . cholecalciferol (VITAMIN D) 1000 UNITS tablet Take 1,000 Units by mouth daily.  . cloNIDine (CATAPRES) 0.3 MG tablet TAKE (1) TABLET BY MOUTH AT BEDTIME.  . fluticasone (FLONASE) 50 MCG/ACT nasal spray Place 2 sprays into both nostrils daily as needed.   Marland Kitchen glipiZIDE (GLIPIZIDE XL) 10 MG 24 hr tablet Two tablets every morning with breakfast  . glucose blood (ACCU-CHEK AVIVA PLUS) test strip 1 each by Other route 2 (two) times daily as needed  for other. Use as instructed  . Insulin Glargine (LANTUS SOLOSTAR) 100 UNIT/ML Solostar Pen INJECT 100 UNITS INTO THE SKIN AT 10 PM.  . metFORMIN (GLUCOPHAGE) 1000 MG tablet Take 1 tablet (1,000 mg total) by mouth daily with breakfast.  . POLY-IRON 150 150 MG capsule TAKE 1 CAPSULE BY MOUTH ONCE DAILY.  . pravastatin (PRAVACHOL) 20 MG tablet Take 1 tablet (20 mg total) by mouth daily.  . ramipril (ALTACE) 10 MG capsule TAKE (1) CAPSULE BY MOUTH ONCE DAILY.  Marland Kitchen spironolactone-hydrochlorothiazide (ALDACTAZIDE) 25-25 MG tablet TAKE ONE TABLET BY MOUTH ONCE DAILY.   No facility-administered encounter medications on file as of 04/27/2017.     Activities of Daily Living In your present state of health, do you have any difficulty performing the following activities: 04/27/2017 01/20/2017  Hearing? N N  Vision? N N  Difficulty concentrating or making decisions? N N  Walking or climbing stairs? N N  Dressing or bathing? N N  Doing errands, shopping? N N  Preparing Food and eating ? N -  Using the Toilet? N -  In the past six months, have you accidently leaked urine? N -  Do you have problems with loss of bowel control? N -  Managing your Medications? N -  Managing your Finances? N -  Housekeeping or managing your Housekeeping? N -  Some recent data might be hidden    Patient Care Team: Fayrene Helper, MD as PCP - General    Assessment:   This is a routine wellness examination for Kathleen [name].  Exercise Activities and Dietary recommendations    Goals    . HEMOGLOBIN A1C < 7.0       Fall Risk Fall Risk  04/27/2017 01/20/2017 04/20/2016 08/12/2015 04/15/2015  Falls in the past year? No No No No No  Risk for fall due to : - - Impaired vision - -   Is the patient's home free of loose throw rugs in walkways, pet beds, electrical cords, etc?   yes      Grab bars in the bathroom? no      Handrails on the stairs?  n/a      Adequate lighting?   yes    Depression Screen PHQ 2/9 Scores  04/27/2017 01/20/2017 04/20/2016 08/12/2015  PHQ - 2 Score 0 0 0 0  PHQ- 9 Score - - - 2     Cognitive Function  6CIT Screen 04/27/2017 04/20/2016  What Year? 0 points 0 points  What month? 0 points 0 points  What time? 0 points 0 points  Count back from 20 0 points 0 points  Months in reverse 0 points 0 points  Repeat phrase 0 points 0 points  Total Score 0 0    Immunization History  Administered Date(s) Administered  . Influenza Split 02/23/2011, 03/14/2012  . Influenza Whole 03/24/2007, 02/21/2008, 02/26/2010  . Influenza,inj,Quad PF,6+ Mos 04/23/2014, 04/15/2015, 12/29/2015, 02/14/2017  . Pneumococcal Conjugate-13 12/26/2013  . Pneumococcal Polysaccharide-23 11/29/2008, 08/12/2015  . Td 02/03/2009  . Zoster 10/28/2010    Qualifies for Shingles Vaccine?done  Screening Tests Health Maintenance  Topic Date Due  . HEMOGLOBIN A1C  07/07/2017  . OPHTHALMOLOGY EXAM  10/08/2017  . FOOT EXAM  01/22/2018  . MAMMOGRAM  02/09/2018  . TETANUS/TDAP  02/04/2019  . COLONOSCOPY  12/02/2019  . INFLUENZA VACCINE  Completed  . DEXA SCAN  Completed  . Hepatitis C Screening  Completed  . PNA vac Low Risk Adult  Completed    Cancer Screenings: Lung: Low Dose CT Chest recommended if Age 21-80 years, 30 pack-year currently smoking OR have quit w/in 15years. Patient does not qualify. Breast:  Up to date on Mammogram? yes       Up to date of Bone Density/Dexa?  Colorectal: discuss with pcp  Additional Screenings:  Hepatitis B/HIV/Syphillis: Hepatitis C Screening:      Plan:     I have personally reviewed and noted the following in the patient's chart:   . Medical and social history . Use of alcohol, tobacco or illicit drugs  . Current medications and supplements . Functional ability and status . Nutritional status . Physical activity . Advanced directives . List of other physicians . Hospitalizations, surgeries, and ER visits in previous 12  months . Vitals . Screenings to include cognitive, depression, and falls . Referrals and appointments  In addition, I have reviewed and discussed with patient certain preventive protocols, quality metrics, and best practice recommendations. A written personalized care plan for preventive services as well as general preventive health recommendations were provided to patient.     Ova Freshwater, LPN  [id number]

## 2017-04-27 NOTE — Patient Instructions (Signed)
Kathleen [name] , Thank you for taking time to come for your Medicare Wellness Visit. I appreciate your ongoing commitment to your health goals. Please review the following plan we discussed and let me know if I can assist you in the future.   Screening recommendations/referrals: Colonoscopy: discuss with pcp Mammogram: done Bone Density: done Recommended yearly ophthalmology/optometry visit for glaucoma screening and checkup Recommended yearly dental visit for hygiene and checkup  Vaccinations: Influenza vaccine: done Pneumococcal vaccine: done Tdap vaccine: done Shingles vaccine: do  Conditions/risks identified: done  Next appointment: need to sch.   Preventive Care 8 Years and Older, Female Preventive care refers to lifestyle choices and visits with your health care provider that can promote health and wellness. What does preventive care include?  A yearly physical exam. This is also called an annual well check.  Dental exams once or twice a year.  Routine eye exams. Ask your health care provider how often you should have your eyes checked.  Personal lifestyle choices, including:  Daily care of your teeth and gums.  Regular physical activity.  Eating a healthy diet.  Avoiding tobacco and drug use.  Limiting alcohol use.  Practicing safe sex.  Taking low-dose aspirin every day.  Taking vitamin and mineral supplements as recommended by your health care provider. What happens during an annual well check? The services and screenings done by your health care provider during your annual well check will depend on your age, overall health, lifestyle risk factors, and family history of disease. Counseling  Your health care provider may ask you questions about your:  Alcohol use.  Tobacco use.  Drug use.  Emotional well-being.  Home and relationship well-being.  Sexual activity.  Eating habits.  History of falls.  Memory and ability to understand  (cognition).  Work and work Statistician.  Reproductive health. Screening  You may have the following tests or measurements:  Height, weight, and BMI.  Blood pressure.  Lipid and cholesterol levels. These may be checked every 5 years, or more frequently if you are over 69 years old.  Skin check.  Lung cancer screening. You may have this screening every year starting at age 69 if you have a 30-pack-year history of smoking and currently smoke or have quit within the past 15 years.  Fecal occult blood test (FOBT) of the stool. You may have this test every year starting at age 55.  Flexible sigmoidoscopy or colonoscopy. You may have a sigmoidoscopy every 5 years or a colonoscopy every 10 years starting at age 31.  Hepatitis C blood test.  Hepatitis B blood test.  Sexually transmitted disease (STD) testing.  Diabetes screening. This is done by checking your blood sugar (glucose) after you have not eaten for a while (fasting). You may have this done every 1-3 years.  Bone density scan. This is done to screen for osteoporosis. You may have this done starting at age 85.  Mammogram. This may be done every 1-2 years. Talk to your health care provider about how often you should have regular mammograms. Talk with your health care provider about your test results, treatment options, and if necessary, the need for more tests. Vaccines  Your health care provider may recommend certain vaccines, such as:  Influenza vaccine. This is recommended every year.  Tetanus, diphtheria, and acellular pertussis (Tdap, Td) vaccine. You may need a Td booster every 10 years.  Zoster vaccine. You may need this after age 13.  Pneumococcal 13-valent conjugate (PCV13) vaccine. One dose is  recommended after age 50.  Pneumococcal polysaccharide (PPSV23) vaccine. One dose is recommended after age 55. Talk to your health care provider about which screenings and vaccines you need and how often you need  them. This information is not intended to replace advice given to you by your health care provider. Make sure you discuss any questions you have with your health care provider. Document Released: 05/23/2015 Document Revised: 01/14/2016 Document Reviewed: 02/25/2015 Elsevier Interactive Patient Education  2017 [place] Prevention in the Home Falls can cause injuries. They can happen to people of all ages. There are many things you can do to make your home safe and to help prevent falls. What can I do on the outside of my home?  Regularly fix the edges of walkways and driveways and fix any cracks.  Remove anything that might make you trip as you walk through a door, such as a raised step or threshold.  Trim any bushes or trees on the path to your home.  Use bright outdoor lighting.  Clear any walking paths of anything that might make someone trip, such as rocks or tools.  Regularly check to see if handrails are loose or broken. Make sure that both sides of any steps have handrails.  Any raised decks and porches should have guardrails on the edges.  Have any leaves, snow, or ice cleared regularly.  Use sand or salt on walking paths during winter.  Clean up any spills in your garage right away. This includes oil or grease spills. What can I do in the bathroom?  Use night lights.  Install grab bars by the toilet and in the tub and shower. Do not use towel bars as grab bars.  Use non-skid mats or decals in the tub or shower.  If you need to sit down in the shower, use a plastic, non-slip stool.  Keep the floor dry. Clean up any water that spills on the floor as soon as it happens.  Remove soap buildup in the tub or shower regularly.  Attach bath mats securely with double-sided non-slip rug tape.  Do not have throw rugs and other things on the floor that can make you trip. What can I do in the bedroom?  Use night lights.  Make sure that you have a light by your  bed that is easy to reach.  Do not use any sheets or blankets that are too big for your bed. They should not hang down onto the floor.  Have a firm chair that has side arms. You can use this for support while you get dressed.  Do not have throw rugs and other things on the floor that can make you trip. What can I do in the kitchen?  Clean up any spills right away.  Avoid walking on wet floors.  Keep items that you use a lot in easy-to-reach places.  If you need to reach something above you, use a strong step stool that has a grab bar.  Keep electrical cords out of the way.  Do not use floor polish or wax that makes floors slippery. If you must use wax, use non-skid floor wax.  Do not have throw rugs and other things on the floor that can make you trip. What can I do with my stairs?  Do not leave any items on the stairs.  Make sure that there are handrails on both sides of the stairs and use them. Fix handrails that are broken or loose. Make  sure that handrails are as long as the stairways.  Check any carpeting to make sure that it is firmly attached to the stairs. Fix any carpet that is loose or worn.  Avoid having throw rugs at the top or bottom of the stairs. If you do have throw rugs, attach them to the floor with carpet tape.  Make sure that you have a light switch at the top of the stairs and the bottom of the stairs. If you do not have them, ask someone to add them for you. What else can I do to help prevent falls?  Wear shoes that:  Do not have high heels.  Have rubber bottoms.  Are comfortable and fit you well.  Are closed at the toe. Do not wear sandals.  If you use a stepladder:  Make sure that it is fully opened. Do not climb a closed stepladder.  Make sure that both sides of the stepladder are locked into place.  Ask someone to hold it for you, if possible.  Clearly mark and make sure that you can see:  Any grab bars or handrails.  First and last  steps.  Where the edge of each step is.  Use tools that help you move around (mobility aids) if they are needed. These include:  Canes.  Walkers.  Scooters.  Crutches.  Turn on the lights when you go into a dark area. Replace any light bulbs as soon as they burn out.  Set up your furniture so you have a clear path. Avoid moving your furniture around.  If any of your floors are uneven, fix them.  If there are any pets around you, be aware of where they are.  Review your medicines with your doctor. Some medicines can make you feel dizzy. This can increase your chance of falling. Ask your doctor what other things that you can do to help prevent falls. This information is not intended to replace advice given to you by your health care provider. Make sure you discuss any questions you have with your health care provider. Document Released: 02/20/2009 Document Revised: 10/02/2015 Document Reviewed: 05/31/2014 Elsevier Interactive Patient Education  2017 [place] for Adults  A healthy lifestyle and preventive care can promote health and wellness. Preventive health guidelines for adults include the following key practices.  . A routine yearly physical is a good way to check with your health care provider about your health and preventive screening. It is a chance to share any concerns and updates on your health and to receive a thorough exam.  . Visit your dentist for a routine exam and preventive care every 6 months. Brush your teeth twice a day and floss once a day. Good oral hygiene prevents tooth decay and gum disease.  . The frequency of eye exams is based on your age, health, family medical history, use  of contact lenses, and other factors. Follow your health care provider's ecommendations for frequency of eye exams.  . Eat a healthy diet. Foods like vegetables, fruits, whole grains, low-fat dairy products, and lean protein foods contain the nutrients you  need without too many calories. Decrease your intake of foods high in solid fats, added sugars, and salt. Eat the right amount of calories for you. Get information about a proper diet from your health care provider, if necessary.  . Regular physical exercise is one of the most important things you can do for your health. Most adults should get at least 150  minutes of moderate-intensity exercise (any activity that increases your heart rate and causes you to sweat) each week. In addition, most adults need muscle-strengthening exercises on 2 or more days a week.  Silver Sneakers may be a benefit available to you. To determine eligibility, you may visit the website: www.silversneakers.com or contact program at 615 493 2577 Mon-Fri between 8AM-8PM.   . Maintain a healthy weight. The body mass index (BMI) is a screening tool to identify possible weight problems. It provides an estimate of body fat based on height and weight. Your health care provider can find your BMI and can help you achieve or maintain a healthy weight.   For adults 20 years and older: ? A BMI below 18.5 is considered underweight. ? A BMI of 18.5 to 24.9 is normal. ? A BMI of 25 to 29.9 is considered overweight. ? A BMI of 30 and above is considered obese.   . Maintain normal blood lipids and cholesterol levels by exercising and minimizing your intake of saturated fat. Eat a balanced diet with plenty of fruit and vegetables. Blood tests for lipids and cholesterol should begin at age 72 and be repeated every 5 years. If your lipid or cholesterol levels are high, you are over 50, or you are at high risk for heart disease, you may need your cholesterol levels checked more frequently. Ongoing high lipid and cholesterol levels should be treated with medicines if diet and exercise are not working.  . If you smoke, find out from your health care provider how to quit. If you do not use tobacco, please do not start.  . If you choose to drink  alcohol, please do not consume more than 2 drinks per day. One drink is considered to be 12 ounces (355 mL) of beer, 5 ounces (148 mL) of wine, or 1.5 ounces (44 mL) of liquor.  . If you are 61-98 years old, ask your health care provider if you should take aspirin to prevent strokes.  . Use sunscreen. Apply sunscreen liberally and repeatedly throughout the day. You should seek shade when your shadow is shorter than you. Protect yourself by wearing long sleeves, pants, a wide-brimmed hat, and sunglasses year round, whenever you are outdoors.  . Once a month, do a whole body skin exam, using a mirror to look at the skin on your back. Tell your health care provider of new moles, moles that have irregular borders, moles that are larger than a pencil eraser, or moles that have changed in shape or color.

## 2017-04-28 LAB — HEMOGLOBIN A1C
EAG (MMOL/L): 9.8 (calc)
Hgb A1c MFr Bld: 7.8 % of total Hgb — ABNORMAL HIGH (ref <5.7)
MEAN PLASMA GLUCOSE: 177 (calc)

## 2017-04-28 LAB — COMPLETE METABOLIC PANEL WITH GFR
AG Ratio: 1.2 (calc) (ref 1.0–2.5)
ALBUMIN MSPROF: 4.2 g/dL (ref 3.6–5.1)
ALT: 11 U/L (ref 6–29)
AST: 11 U/L (ref 10–35)
Alkaline phosphatase (APISO): 85 U/L (ref 33–130)
BUN / CREAT RATIO: 17 (calc) (ref 6–22)
BUN: 28 mg/dL — AB (ref 7–25)
CO2: 26 mmol/L (ref 20–32)
CREATININE: 1.63 mg/dL — AB (ref 0.50–0.99)
Calcium: 9.7 mg/dL (ref 8.6–10.4)
Chloride: 105 mmol/L (ref 98–110)
GFR, EST AFRICAN AMERICAN: 37 mL/min/{1.73_m2} — AB (ref >=60)
GFR, Est Non African American: 32 mL/min/{1.73_m2} — ABNORMAL LOW (ref >=60)
GLUCOSE: 55 mg/dL — AB (ref 65–99)
Globulin: 3.4 g/dL (calc) (ref 1.9–3.7)
Potassium: 4.4 mmol/L (ref 3.5–5.3)
Sodium: 139 mmol/L (ref 135–146)
TOTAL PROTEIN: 7.6 g/dL (ref 6.1–8.1)
Total Bilirubin: 0.3 mg/dL (ref 0.2–1.2)

## 2017-04-28 LAB — LIPID PANEL
CHOL/HDL RATIO: 2.8 (calc) (ref <5.0)
CHOLESTEROL: 132 mg/dL (ref <200)
HDL: 47 mg/dL — AB (ref >50)
LDL CHOLESTEROL (CALC): 69 mg/dL
Non-HDL Cholesterol (Calc): 85 mg/dL (calc) (ref <130)
Triglycerides: 84 mg/dL (ref <150)

## 2017-04-28 NOTE — Telephone Encounter (Signed)
Labs ordered.

## 2017-04-29 ENCOUNTER — Other Ambulatory Visit: Payer: Self-pay | Admitting: Family Medicine

## 2017-04-29 ENCOUNTER — Telehealth: Payer: Self-pay | Admitting: Family Medicine

## 2017-05-03 NOTE — Telephone Encounter (Signed)
No notes for this encounter

## 2017-07-20 ENCOUNTER — Other Ambulatory Visit: Payer: Self-pay | Admitting: Family Medicine

## 2017-08-16 ENCOUNTER — Encounter: Payer: Self-pay | Admitting: Family Medicine

## 2017-08-16 ENCOUNTER — Telehealth: Payer: Self-pay

## 2017-08-16 NOTE — Telephone Encounter (Signed)
Called patient to schedule an appt, no answer, will mail out a request to make an appt.

## 2017-08-16 NOTE — Telephone Encounter (Signed)
Wants to schedule appt (was supposed to come back in Dec)

## 2017-08-18 ENCOUNTER — Telehealth: Payer: Self-pay | Admitting: Family Medicine

## 2017-08-18 NOTE — Telephone Encounter (Signed)
Patient left a voicemail with no details, only a call back #: [phone]  I returned her call, no answer. Left a voicemail for her to call back.

## 2017-08-19 ENCOUNTER — Other Ambulatory Visit: Payer: Self-pay | Admitting: Family Medicine

## 2017-09-07 ENCOUNTER — Other Ambulatory Visit: Payer: Self-pay | Admitting: Family Medicine

## 2017-10-14 ENCOUNTER — Other Ambulatory Visit: Payer: Self-pay | Admitting: Family Medicine

## 2017-10-18 ENCOUNTER — Other Ambulatory Visit: Payer: Self-pay | Admitting: Family Medicine

## 2017-10-26 ENCOUNTER — Telehealth: Payer: Self-pay

## 2017-10-26 ENCOUNTER — Ambulatory Visit: Payer: Medicare Other | Admitting: Family Medicine

## 2017-10-26 DIAGNOSIS — E785 Hyperlipidemia, unspecified: Secondary | ICD-10-CM

## 2017-10-26 DIAGNOSIS — E559 Vitamin D deficiency, unspecified: Secondary | ICD-10-CM

## 2017-10-26 DIAGNOSIS — E10649 Type 1 diabetes mellitus with hypoglycemia without coma: Secondary | ICD-10-CM

## 2017-10-26 DIAGNOSIS — I10 Essential (primary) hypertension: Secondary | ICD-10-CM

## 2017-10-26 DIAGNOSIS — D649 Anemia, unspecified: Secondary | ICD-10-CM

## 2017-10-26 NOTE — Telephone Encounter (Signed)
Labs ordered per Dr.Simpson

## 2017-10-27 DIAGNOSIS — E559 Vitamin D deficiency, unspecified: Secondary | ICD-10-CM | POA: Diagnosis not present

## 2017-10-27 DIAGNOSIS — D649 Anemia, unspecified: Secondary | ICD-10-CM | POA: Diagnosis not present

## 2017-10-27 DIAGNOSIS — I10 Essential (primary) hypertension: Secondary | ICD-10-CM | POA: Diagnosis not present

## 2017-10-27 DIAGNOSIS — E785 Hyperlipidemia, unspecified: Secondary | ICD-10-CM | POA: Diagnosis not present

## 2017-10-27 DIAGNOSIS — E10649 Type 1 diabetes mellitus with hypoglycemia without coma: Secondary | ICD-10-CM | POA: Diagnosis not present

## 2017-10-28 LAB — COMPLETE METABOLIC PANEL WITH GFR
AG Ratio: 1.1 (calc) (ref 1.0–2.5)
ALT: 10 U/L (ref 6–29)
AST: 8 U/L — ABNORMAL LOW (ref 10–35)
Albumin: 3.9 g/dL (ref 3.6–5.1)
Alkaline phosphatase (APISO): 85 U/L (ref 33–130)
BUN / CREAT RATIO: 15 (calc) (ref 6–22)
BUN: 26 mg/dL — AB (ref 7–25)
CALCIUM: 9.5 mg/dL (ref 8.6–10.4)
CHLORIDE: 102 mmol/L (ref 98–110)
CO2: 28 mmol/L (ref 20–32)
CREATININE: 1.72 mg/dL — AB (ref 0.50–0.99)
GFR, EST AFRICAN AMERICAN: 35 mL/min/{1.73_m2} — AB (ref >=60)
GFR, EST NON AFRICAN AMERICAN: 30 mL/min/{1.73_m2} — AB (ref >=60)
GLUCOSE: 107 mg/dL — AB (ref 65–99)
Globulin: 3.4 g/dL (calc) (ref 1.9–3.7)
Potassium: 4.5 mmol/L (ref 3.5–5.3)
Sodium: 137 mmol/L (ref 135–146)
TOTAL PROTEIN: 7.3 g/dL (ref 6.1–8.1)
Total Bilirubin: 0.3 mg/dL (ref 0.2–1.2)

## 2017-10-28 LAB — LIPID PANEL
CHOL/HDL RATIO: 4 (calc) (ref <5.0)
Cholesterol: 143 mg/dL (ref <200)
HDL: 36 mg/dL — ABNORMAL LOW (ref >50)
LDL CHOLESTEROL (CALC): 83 mg/dL
Non-HDL Cholesterol (Calc): 107 mg/dL (calc) (ref <130)
TRIGLYCERIDES: 139 mg/dL (ref <150)

## 2017-10-28 LAB — HEMOGLOBIN A1C
EAG (MMOL/L): 13.2 (calc)
HEMOGLOBIN A1C: 9.9 %{Hb} — AB (ref <5.7)
Mean Plasma Glucose: 237 (calc)

## 2017-10-28 LAB — CBC
HEMATOCRIT: 32.3 % — AB (ref 35.0–45.0)
Hemoglobin: 10.5 g/dL — ABNORMAL LOW (ref 11.7–15.5)
MCH: 26 pg — ABNORMAL LOW (ref 27.0–33.0)
MCHC: 32.5 g/dL (ref 32.0–36.0)
MCV: 80 fL (ref 80.0–100.0)
MPV: 11.5 fL (ref 7.5–12.5)
PLATELETS: 277 10*3/uL (ref 140–400)
RBC: 4.04 10*6/uL (ref 3.80–5.10)
RDW: 13.3 % (ref 11.0–15.0)
WBC: 6.5 10*3/uL (ref 3.8–10.8)

## 2017-10-28 LAB — TSH: TSH: 2.88 mIU/L (ref 0.40–4.50)

## 2017-10-28 LAB — MICROALBUMIN, URINE: Microalb, Ur: 0.4 mg/dL

## 2017-10-28 LAB — VITAMIN D 25 HYDROXY (VIT D DEFICIENCY, FRACTURES): VIT D 25 HYDROXY: 21 ng/mL — AB (ref 30–100)

## 2017-10-31 ENCOUNTER — Ambulatory Visit (INDEPENDENT_AMBULATORY_CARE_PROVIDER_SITE_OTHER): Payer: Medicare Other | Admitting: Family Medicine

## 2017-10-31 ENCOUNTER — Encounter: Payer: Self-pay | Admitting: Family Medicine

## 2017-10-31 VITALS — BP 140/62 | HR 78 | Resp 16 | Ht 65.0 in | Wt 235.0 lb

## 2017-10-31 DIAGNOSIS — I10 Essential (primary) hypertension: Secondary | ICD-10-CM

## 2017-10-31 DIAGNOSIS — R600 Localized edema: Secondary | ICD-10-CM

## 2017-10-31 DIAGNOSIS — Z1231 Encounter for screening mammogram for malignant neoplasm of breast: Secondary | ICD-10-CM

## 2017-10-31 DIAGNOSIS — E10649 Type 1 diabetes mellitus with hypoglycemia without coma: Secondary | ICD-10-CM

## 2017-10-31 DIAGNOSIS — Z1331 Encounter for screening for depression: Secondary | ICD-10-CM

## 2017-10-31 DIAGNOSIS — E785 Hyperlipidemia, unspecified: Secondary | ICD-10-CM | POA: Diagnosis not present

## 2017-10-31 MED ORDER — INSULIN GLARGINE 100 UNIT/ML SOLOSTAR PEN: PEN_INJECTOR | SUBCUTANEOUS | 5 refills | Status: DC

## 2017-10-31 MED ORDER — FUROSEMIDE 20 MG PO TABS: 20.0000 mg | ORAL_TABLET | Freq: Every day | ORAL | 3 refills | Status: DC

## 2017-10-31 NOTE — Patient Instructions (Addendum)
F/U In 8weeks with blood sugar log, need to test and record 3 times daily  Please schedule mammogram at checkout  You are also referred for eye exam  You are being referred to diabetic educator very important you go   Fasting sugar goal is 90 to 130  2 hrs after meal and bedtime is 130 to 180  New for leg swelling is furosemide one daily  Please reduce salt, bread  potaoes and increase vegetables  Need to eat on schedule  It is important that you exercise regularly at least 30 minutes 5 times a week. If you develop chest pain, have severe difficulty breathing, or feel very tired, stop exercising immediately and seek medical attention

## 2017-10-31 NOTE — Assessment & Plan Note (Addendum)
Deteriorated, refer to diabetic educator, 7 minutes spent on education at the visit and she does need to r test at home more frequency at least 3 times daily, as well as follow a carb restricted diet, her knowledge of carb counting is limited Kathleen [name] is reminded of the importance of commitment to daily physical activity for 30 minutes or more, as able and the need to limit carbohydrate intake to 30 to 60 grams per meal to help with blood sugar control.   The need to take medication as prescribed, test blood sugar as directed, and to call between visits if there is a concern that blood sugar is uncontrolled is also discussed.   Kathleen [name] is reminded of the importance of daily foot exam, annual eye examination, and good blood sugar, blood pressure and cholesterol control.  Diabetic Labs Latest Ref Rng & Units 10/27/2017 04/27/2017 01/06/2017 08/19/2016 04/20/2016  HbA1c <5.7 % of total Hgb 9.9(H) 7.8(H) 9.5(H) 9.1(H) 8.4(H)  Microalbumin mg/dL 0.4 - - - -  Micro/Creat Ratio 0.0 - 30.0 mg/g creat - - - - -  Chol <200 mg/dL 143 132 205(H) - 170  HDL >50 mg/dL 36(L) 47(L) 44(L) - 43(L)  Calc LDL mg/dL (calc) 83 69 121(H) - 99  Triglycerides <150 mg/dL 139 84 202(H) - 141  Creatinine 0.50 - 0.99 mg/dL 1.72(H) 1.63(H) 1.38(H) 1.44(H) 1.32(H)   BP/Weight 10/31/2017 04/27/2017 02/14/2017 01/20/2017 08/19/2016 04/20/2016 8/89/1694  Systolic BP 503 888 280 034 917 915 056  Diastolic BP 62 68 64 70 82 74 80  Wt. (Lbs) 235 232.04 230.5 223 229 229.12 226  BMI 39.11 38.61 38.36 37.11 35.87 35.89 36.48   Foot/eye exam completion dates Latest Ref Rng & Units 01/20/2017 10/08/2016  Eye Exam No Retinopathy - No Retinopathy  Foot Form Completion - Done -

## 2017-11-06 ENCOUNTER — Encounter: Payer: Self-pay | Admitting: Family Medicine

## 2017-11-06 DIAGNOSIS — Z1331 Encounter for screening for depression: Secondary | ICD-10-CM | POA: Insufficient documentation

## 2017-11-06 DIAGNOSIS — R600 Localized edema: Secondary | ICD-10-CM | POA: Insufficient documentation

## 2017-11-06 NOTE — Assessment & Plan Note (Signed)
Pt is screened and she is not depressed

## 2017-11-06 NOTE — Assessment & Plan Note (Signed)
Not at goal, no medication change at this visit DASH diet and commitment to daily physical activity for a minimum of 30 minutes discussed and encouraged, as a part of hypertension management. The importance of attaining a healthy weight is also discussed.  BP/Weight 10/31/2017 04/27/2017 02/14/2017 01/20/2017 08/19/2016 04/20/2016 0/01/3817  Systolic BP 299 371 696 789 381 017 510  Diastolic BP 62 68 64 70 82 74 80  Wt. (Lbs) 235 232.04 230.5 223 229 229.12 226  BMI 39.11 38.61 38.36 37.11 35.87 35.89 36.48

## 2017-11-06 NOTE — Assessment & Plan Note (Signed)
Deteriorated. Patient re-educated about  the importance of commitment to a  minimum of 150 minutes of exercise per week.  The importance of healthy food choices with portion control discussed. Encouraged to start a food diary, count calories and to consider  joining a support group. Sample diet sheets offered. Goals set by the patient for the next several months.   Weight /BMI 10/31/2017 04/27/2017 02/14/2017  WEIGHT 235 lb 232 lb 0.6 oz 230 lb 8 oz  HEIGHT 5\' 5"  5\' 5"  5\' 5"   BMI 39.11 kg/m2 38.61 kg/m2 38.36 kg/m2

## 2017-11-06 NOTE — Assessment & Plan Note (Signed)
Sodium restriction and leg elevation encouraged also lasix for as needed use prescribed

## 2017-11-06 NOTE — Assessment & Plan Note (Signed)
Hyperlipidemia:Low fat diet discussed and encouraged.   Lipid Panel  Lab Results  Component Value Date   CHOL 143 10/27/2017   HDL 36 (L) 10/27/2017   LDLCALC 83 10/27/2017   TRIG 139 10/27/2017   CHOLHDL 4.0 10/27/2017    Needs to commit to regular exercise to improve HDL,m no medication change

## 2017-11-06 NOTE — Progress Notes (Signed)
Kathleen [name]     MRN: [id number]      DOB: [birth year]   HPI Kathleen [name] is here for follow up and re-evaluation of chronic medical conditions, medication management and review of any available recent lab and radiology data.  Preventive health is updated, specifically  Cancer screening and Immunization.   . The PT denies any adverse reactions to current medications since the last visit.  Has not been testing blood  sugars regulalrly Denies polyuria, polydipsia, blurred vision , or hypoglycemic episodes.   ROS Denies recent fever or chills. Denies sinus pressure, nasal congestion, ear pain or sore throat. Denies chest congestion, productive cough or wheezing. Denies chest pains, palpitations , PND or orthopnea, she c/o  leg swelling Denies abdominal pain, nausea, vomiting,diarrhea or constipation.   Denies dysuria, frequency, hesitancy or incontinence. Denies joint pain, swelling and limitation in mobility. Denies headaches, seizures, numbness, or tingling. Denies depression, anxiety or insomnia. Denies skin break down or rash.   PE  BP 140/62   Pulse 78   Resp 16   Ht 5\' 5"  (1.651 m)   Wt 235 lb (106.6 kg)   SpO2 96%   BMI 39.11 kg/m   Patient alert and oriented and in no cardiopulmonary distress.  HEENT: No facial asymmetry, EOMI,   oropharynx pink and moist.  Neck supple no JVD, no mass.  Chest: Clear to auscultation bilaterally.  CVS: S1, S2 no murmurs, no S3.Regular rate.  ABD: Soft non tender.   Ext: one plus pitting bilateral edema  MS: Adequate though reduced  ROM spine, shoulders, hips and knees.  Skin: Intact, no ulcerations or rash noted.  Psych: Good eye contact, normal affect. Memory intact not anxious or depressed appearing.  CNS: CN 2-12 intact, power,  normal throughout.no focal deficits noted.   Assessment & Plan  Diabetes mellitus, insulin dependent (IDDM), uncontrolled (HCC) Deteriorated, refer to diabetic educator, 7 minutes spent on  education at the visit and she does need to r test at home more frequency at least 3 times daily, as well as follow a carb restricted diet, her knowledge of carb counting is limited [name] is reminded of the importance of commitment to daily physical activity for 30 minutes or more, as able and the need to limit carbohydrate intake to 30 to 60 grams per meal to help with blood sugar control.   The need to take medication as prescribed, test blood sugar as directed, and to call between visits if there is a concern that blood sugar is uncontrolled is also discussed.   [name] is reminded of the importance of daily foot exam, annual eye examination, and good blood sugar, blood pressure and cholesterol control.  Diabetic Labs Latest Ref Rng & Units 10/27/2017 04/27/2017 01/06/2017 08/19/2016 04/20/2016  HbA1c <5.7 % of total Hgb 9.9(H) 7.8(H) 9.5(H) 9.1(H) 8.4(H)  Microalbumin mg/dL 0.4 - - - -  Micro/Creat Ratio 0.0 - 30.0 mg/g creat - - - - -  Chol <200 mg/dL 143 132 205(H) - 170  HDL >50 mg/dL 36(L) 47(L) 44(L) - 43(L)  Calc LDL mg/dL (calc) 83 69 121(H) - 99  Triglycerides <150 mg/dL 139 84 202(H) - 141  Creatinine 0.50 - 0.99 mg/dL 1.72(H) 1.63(H) 1.38(H) 1.44(H) 1.32(H)   BP/Weight 10/31/2017 04/27/2017 02/14/2017 01/20/2017 08/19/2016 04/20/2016 9/48/5462  Systolic BP 703 500 938 182 993 716 967  Diastolic BP 62 68 64 70 82 74 80  Wt. (Lbs) 235 232.04 230.5 223 229 229.12 226  BMI  39.11 38.61 38.36 37.11 35.87 35.89 36.48   Foot/eye exam completion dates Latest Ref Rng & Units 01/20/2017 10/08/2016  Eye Exam No Retinopathy - No Retinopathy  Foot Form Completion - Done -        Morbid obesity (HCC) Deteriorated. Patient re-educated about  the importance of commitment to a  minimum of 150 minutes of exercise per week.  The importance of healthy food choices with portion control discussed. Encouraged to start a food diary, count calories and to consider  joining a support  group. Sample diet sheets offered. Goals set by the patient for the next several months.   Weight /BMI 10/31/2017 04/27/2017 02/14/2017  WEIGHT 235 lb 232 lb 0.6 oz 230 lb 8 oz  HEIGHT 5\' 5"  5\' 5"  5\' 5"   BMI 39.11 kg/m2 38.61 kg/m2 38.36 kg/m2      Essential hypertension Not at goal, no medication change at this visit DASH diet and commitment to daily physical activity for a minimum of 30 minutes discussed and encouraged, as a part of hypertension management. The importance of attaining a healthy weight is also discussed.  BP/Weight 10/31/2017 04/27/2017 02/14/2017 01/20/2017 08/19/2016 04/20/2016 0/27/7412  Systolic BP 878 676 720 947 096 283 662  Diastolic BP 62 68 64 70 82 74 80  Wt. (Lbs) 235 232.04 230.5 223 229 229.12 226  BMI 39.11 38.61 38.36 37.11 35.87 35.89 36.48       Hyperlipidemia LDL goal <100 Hyperlipidemia:Low fat diet discussed and encouraged.   Lipid Panel  Lab Results  Component Value Date   CHOL 143 10/27/2017   HDL 36 (L) 10/27/2017   LDLCALC 83 10/27/2017   TRIG 139 10/27/2017   CHOLHDL 4.0 10/27/2017    Needs to commit to regular exercise to improve HDL,m no medication change  Screening for depression Pt is screened and she is not depressed  Bilateral leg edema Sodium restriction and leg elevation encouraged also lasix for as needed use prescribed

## 2017-11-14 ENCOUNTER — Ambulatory Visit (HOSPITAL_COMMUNITY)
Admission: RE | Admit: 2017-11-14 | Discharge: 2017-11-14 | Disposition: A | Discharge status: Home / Self Care | Payer: Medicare Other | Source: Ambulatory Visit | Attending: Family Medicine | Admitting: Family Medicine

## 2017-11-14 ENCOUNTER — Encounter (HOSPITAL_COMMUNITY): Payer: Self-pay

## 2017-11-14 DIAGNOSIS — Z1231 Encounter for screening mammogram for malignant neoplasm of breast: Secondary | ICD-10-CM

## 2017-11-21 ENCOUNTER — Other Ambulatory Visit: Payer: Self-pay | Admitting: Family Medicine

## 2017-12-02 ENCOUNTER — Other Ambulatory Visit: Payer: Self-pay | Admitting: Family Medicine

## 2017-12-27 ENCOUNTER — Ambulatory Visit (INDEPENDENT_AMBULATORY_CARE_PROVIDER_SITE_OTHER): Payer: Medicare Other | Admitting: Family Medicine

## 2017-12-27 ENCOUNTER — Encounter: Payer: Self-pay | Admitting: Family Medicine

## 2017-12-27 VITALS — BP 122/72 | HR 72 | Resp 16 | Ht 65.0 in | Wt 235.0 lb

## 2017-12-27 DIAGNOSIS — Z23 Encounter for immunization: Secondary | ICD-10-CM | POA: Diagnosis not present

## 2017-12-27 DIAGNOSIS — E785 Hyperlipidemia, unspecified: Secondary | ICD-10-CM

## 2017-12-27 DIAGNOSIS — I10 Essential (primary) hypertension: Secondary | ICD-10-CM

## 2017-12-27 DIAGNOSIS — E10649 Type 1 diabetes mellitus with hypoglycemia without coma: Secondary | ICD-10-CM

## 2017-12-27 LAB — GLUCOSE, POCT (MANUAL RESULT ENTRY): POC Glucose: 141 mg/dl — AB (ref 70–99)

## 2017-12-27 MED ORDER — BLOOD GLUCOSE METER KIT: PACK | 0 refills | Status: DC

## 2017-12-27 NOTE — Assessment & Plan Note (Signed)
After obtaining informed consent, the vaccine is  administered by LPN.  

## 2017-12-27 NOTE — Progress Notes (Signed)
Kathleen [name]     MRN: [id number]      DOB: [birth year]   HPI Kathleen [name] is here for follow up and re-evaluation of chronic medical conditions, medication management and review of any available recent lab and radiology data.  Preventive health is updated, specifically  Cancer screening and Immunization.   Questions or concerns regarding consultations or procedures which the PT has had in the interim are  addressed. The PT denies any adverse reactions to current medications since the last visit.  Pt reports excellent blood sugars, tests 3 times daily, fasting average  70 to  130, before lunch lunch 120 to 150 and before dinner 130 to 170 Denies blood sugar lows and feels much better overall   ROS Denies recent fever or chills. Denies sinus pressure, nasal congestion, ear pain or sore throat. Denies chest congestion, productive cough or wheezing. Denies chest pains, palpitations and leg swelling Denies abdominal pain, nausea, vomiting,diarrhea or constipation.   Denies dysuria, frequency, hesitancy or incontinence. Denies joint pain, swelling and limitation in mobility. Denies headaches, seizures, numbness, or tingling. Denies depression, anxiety or insomnia. Denies skin break down or rash.   PE  BP 122/72   Pulse 72   Resp 16   Ht 5\' 5"  (1.651 m)   Wt 235 lb (106.6 kg)   SpO2 97%   BMI 39.11 kg/m   Patient alert and oriented and in no cardiopulmonary distress.  HEENT: No facial asymmetry, EOMI,   oropharynx pink and moist.  Neck supple no JVD, no mass.  Chest: Clear to auscultation bilaterally.  CVS: S1, S2 no murmurs, no S3.Regular rate.  ABD: Soft non tender.   Ext: No edema  MS: Adequate ROM spine, shoulders, hips and knees.  Skin: Intact, no ulcerations or rash noted.  Psych: Good eye contact, normal affect. Memory intact not anxious or depressed appearing.  CNS: CN 2-12 intact, power,  normal throughout.no focal deficits noted.   Assessment &  Plan Need for immunization against influenza After obtaining informed consent, the vaccine is  administered by LPN.   Essential hypertension Controlled, no change in medication DASH diet and commitment to daily physical activity for a minimum of 30 minutes discussed and encouraged, as a part of hypertension management. The importance of attaining a healthy weight is also discussed.  BP/Weight 12/27/2017 10/31/2017 04/27/2017 02/14/2017 01/20/2017 08/19/2016 27/07/5007  Systolic BP 381 829 937 169 678 938 101  Diastolic BP 72 62 68 64 70 82 74  Wt. (Lbs) 235 235 232.04 230.5 223 229 229.12  BMI 39.11 39.11 38.61 38.36 37.11 35.87 35.89        Morbid obesity (HCC) Unchanged Patient re-educated about  the importance of commitment to a  minimum of 150 minutes of exercise per week.  The importance of healthy food choices with portion control discussed. Encouraged to start a food diary, count calories and to consider  joining a support group. Sample diet sheets offered. Goals set by the patient for the next several months.   Weight /BMI 12/27/2017 10/31/2017 04/27/2017  WEIGHT 235 lb 235 lb 232 lb 0.6 oz  HEIGHT 5\' 5"  5\' 5"  5\' 5"   BMI 39.11 kg/m2 39.11 kg/m2 38.61 kg/m2      Diabetes mellitus, insulin dependent (IDDM), uncontrolled ([place]) Uncontrolled, per pt report seems to be improved Kathleen [name] is reminded of the importance of commitment to daily physical activity for 30 minutes or more, as able and the need to limit carbohydrate intake to 30 to  60 grams per meal to help with blood sugar control.   The need to take medication as prescribed, test blood sugar as directed, and to call between visits if there is a concern that blood sugar is uncontrolled is also discussed.   Kathleen [name] is reminded of the importance of daily foot exam, annual eye examination, and good blood sugar, blood pressure and cholesterol control.  Diabetic Labs Latest Ref Rng & Units 10/27/2017 04/27/2017  01/06/2017 08/19/2016 04/20/2016  HbA1c <5.7 % of total Hgb 9.9(H) 7.8(H) 9.5(H) 9.1(H) 8.4(H)  Microalbumin mg/dL 0.4 - - - -  Micro/Creat Ratio 0.0 - 30.0 mg/g creat - - - - -  Chol <200 mg/dL 143 132 205(H) - 170  HDL >50 mg/dL 36(L) 47(L) 44(L) - 43(L)  Calc LDL mg/dL (calc) 83 69 121(H) - 99  Triglycerides <150 mg/dL 139 84 202(H) - 141  Creatinine 0.50 - 0.99 mg/dL 1.72(H) 1.63(H) 1.38(H) 1.44(H) 1.32(H)   BP/Weight 12/27/2017 10/31/2017 04/27/2017 02/14/2017 01/20/2017 08/19/2016 53/64/6803  Systolic BP 212 248 250 037 048 889 169  Diastolic BP 72 62 68 64 70 82 74  Wt. (Lbs) 235 235 232.04 230.5 223 229 229.12  BMI 39.11 39.11 38.61 38.36 37.11 35.87 35.89   Foot/eye exam completion dates Latest Ref Rng & Units 01/20/2017 10/08/2016  Eye Exam No Retinopathy - No Retinopathy  Foot Form Completion - Done -        Hyperlipidemia LDL goal <100 Hyperlipidemia:Low fat diet discussed and encouraged.   Lipid Panel  Lab Results  Component Value Date   CHOL 143 10/27/2017   HDL 36 (L) 10/27/2017   LDLCALC 83 10/27/2017   TRIG 139 10/27/2017   CHOLHDL 4.0 10/27/2017

## 2017-12-27 NOTE — Patient Instructions (Addendum)
Annual Physical exam with MD early October, call if you need me sooner  Flu vaccine today  Non fasting HBA1C , chem 7 and EGFR 5 days before next visit ( [facility]), cbc, iron and ferritin  Blood sugar to be checked in office today  New meter is at the pharmacy for you testing 3 times daily   Sounds as though you are [facility] better, eat regularly    Please commit to walking regularly for up to 30 mins/ day  I AM PROUD of  How well you ARE DOING, KEEP IT UP!!!

## 2017-12-28 ENCOUNTER — Other Ambulatory Visit: Payer: Self-pay | Admitting: Family Medicine

## 2017-12-28 ENCOUNTER — Telehealth: Payer: Self-pay | Admitting: Family Medicine

## 2017-12-28 NOTE — Telephone Encounter (Signed)
New Message  Pt verbalized she is wanting for nurse to give her a call back.  Please f/u

## 2017-12-29 ENCOUNTER — Telehealth: Payer: Self-pay | Admitting: Family Medicine

## 2017-12-29 NOTE — Telephone Encounter (Signed)
Spoke with [facility] and they will call with any problems

## 2017-12-29 NOTE — Telephone Encounter (Signed)
THIS HAS BEEN RESENT

## 2017-12-29 NOTE — Telephone Encounter (Signed)
Patient called and said she need a new meter.  She said she has the strips and medication but needs meter.  Please call in

## 2017-12-29 NOTE — Telephone Encounter (Signed)
Meter rx resent

## 2017-12-31 ENCOUNTER — Encounter: Payer: Self-pay | Admitting: Family Medicine

## 2017-12-31 NOTE — Assessment & Plan Note (Signed)
Hyperlipidemia:Low fat diet discussed and encouraged.   Lipid Panel  Lab Results  Component Value Date   CHOL 143 10/27/2017   HDL 36 (L) 10/27/2017   LDLCALC 83 10/27/2017   TRIG 139 10/27/2017   CHOLHDL 4.0 10/27/2017

## 2017-12-31 NOTE — Assessment & Plan Note (Signed)
Unchanged Patient re-educated about  the importance of commitment to a  minimum of 150 minutes of exercise per week.  The importance of healthy food choices with portion control discussed. Encouraged to start a food diary, count calories and to consider  joining a support group. Sample diet sheets offered. Goals set by the patient for the next several months.   Weight /BMI 12/27/2017 10/31/2017 04/27/2017  WEIGHT 235 lb 235 lb 232 lb 0.6 oz  HEIGHT 5\' 5"  5\' 5"  5\' 5"   BMI 39.11 kg/m2 39.11 kg/m2 38.61 kg/m2

## 2017-12-31 NOTE — Assessment & Plan Note (Signed)
Controlled, no change in medication DASH diet and commitment to daily physical activity for a minimum of 30 minutes discussed and encouraged, as a part of hypertension management. The importance of attaining a healthy weight is also discussed.  BP/Weight 12/27/2017 10/31/2017 04/27/2017 02/14/2017 01/20/2017 08/19/2016 46/50/3546  Systolic BP 568 127 517 001 749 449 675  Diastolic BP 72 62 68 64 70 82 74  Wt. (Lbs) 235 235 232.04 230.5 223 229 229.12  BMI 39.11 39.11 38.61 38.36 37.11 35.87 35.89

## 2017-12-31 NOTE — Assessment & Plan Note (Signed)
Uncontrolled, per pt report seems to be improved Kathleen [name] is reminded of the importance of commitment to daily physical activity for 30 minutes or more, as able and the need to limit carbohydrate intake to 30 to 60 grams per meal to help with blood sugar control.   The need to take medication as prescribed, test blood sugar as directed, and to call between visits if there is a concern that blood sugar is uncontrolled is also discussed.   Kathleen [name] is reminded of the importance of daily foot exam, annual eye examination, and good blood sugar, blood pressure and cholesterol control.  Diabetic Labs Latest Ref Rng & Units 10/27/2017 04/27/2017 01/06/2017 08/19/2016 04/20/2016  HbA1c <5.7 % of total Hgb 9.9(H) 7.8(H) 9.5(H) 9.1(H) 8.4(H)  Microalbumin mg/dL 0.4 - - - -  Micro/Creat Ratio 0.0 - 30.0 mg/g creat - - - - -  Chol <200 mg/dL 143 132 205(H) - 170  HDL >50 mg/dL 36(L) 47(L) 44(L) - 43(L)  Calc LDL mg/dL (calc) 83 69 121(H) - 99  Triglycerides <150 mg/dL 139 84 202(H) - 141  Creatinine 0.50 - 0.99 mg/dL 1.72(H) 1.63(H) 1.38(H) 1.44(H) 1.32(H)   BP/Weight 12/27/2017 10/31/2017 04/27/2017 02/14/2017 01/20/2017 08/19/2016 33/43/5686  Systolic BP 168 372 902 111 552 080 223  Diastolic BP 72 62 68 64 70 82 74  Wt. (Lbs) 235 235 232.04 230.5 223 229 229.12  BMI 39.11 39.11 38.61 38.36 37.11 35.87 35.89   Foot/eye exam completion dates Latest Ref Rng & Units 01/20/2017 10/08/2016  Eye Exam No Retinopathy - No Retinopathy  Foot Form Completion - Done -

## 2018-01-31 ENCOUNTER — Other Ambulatory Visit: Payer: Self-pay | Admitting: Family Medicine

## 2018-02-02 ENCOUNTER — Telehealth: Payer: Self-pay

## 2018-02-02 DIAGNOSIS — I10 Essential (primary) hypertension: Secondary | ICD-10-CM

## 2018-02-02 DIAGNOSIS — D649 Anemia, unspecified: Secondary | ICD-10-CM

## 2018-02-02 DIAGNOSIS — E10649 Type 1 diabetes mellitus with hypoglycemia without coma: Secondary | ICD-10-CM

## 2018-02-02 NOTE — Telephone Encounter (Signed)
Labs ordered and mailed to patient per Dr.Simpson

## 2018-02-11 ENCOUNTER — Other Ambulatory Visit: Payer: Self-pay | Admitting: Family Medicine

## 2018-02-22 DIAGNOSIS — E10649 Type 1 diabetes mellitus with hypoglycemia without coma: Secondary | ICD-10-CM | POA: Diagnosis not present

## 2018-02-22 DIAGNOSIS — D649 Anemia, unspecified: Secondary | ICD-10-CM | POA: Diagnosis not present

## 2018-02-22 DIAGNOSIS — I10 Essential (primary) hypertension: Secondary | ICD-10-CM | POA: Diagnosis not present

## 2018-02-23 LAB — CBC
HEMATOCRIT: 34.1 % — AB (ref 35.0–45.0)
Hemoglobin: 10.8 g/dL — ABNORMAL LOW (ref 11.7–15.5)
MCH: 25.8 pg — AB (ref 27.0–33.0)
MCHC: 31.7 g/dL — AB (ref 32.0–36.0)
MCV: 81.4 fL (ref 80.0–100.0)
MPV: 11.6 fL (ref 7.5–12.5)
Platelets: 287 10*3/uL (ref 140–400)
RBC: 4.19 10*6/uL (ref 3.80–5.10)
RDW: 13.2 % (ref 11.0–15.0)
WBC: 8 10*3/uL (ref 3.8–10.8)

## 2018-02-23 LAB — BASIC METABOLIC PANEL WITH GFR
BUN / CREAT RATIO: 19 (calc) (ref 6–22)
BUN: 41 mg/dL — ABNORMAL HIGH (ref 7–25)
CO2: 25 mmol/L (ref 20–32)
CREATININE: 2.21 mg/dL — AB (ref 0.50–0.99)
Calcium: 9.2 mg/dL (ref 8.6–10.4)
Chloride: 98 mmol/L (ref 98–110)
GFR, EST AFRICAN AMERICAN: 26 mL/min/{1.73_m2} — AB (ref >=60)
GFR, EST NON AFRICAN AMERICAN: 22 mL/min/{1.73_m2} — AB (ref >=60)
Glucose, Bld: 168 mg/dL — ABNORMAL HIGH (ref 65–99)
Potassium: 5.2 mmol/L (ref 3.5–5.3)
SODIUM: 134 mmol/L — AB (ref 135–146)

## 2018-02-23 LAB — IRON: Iron: 54 ug/dL (ref 45–160)

## 2018-02-23 LAB — FERRITIN: Ferritin: 112 ng/mL (ref 16–288)

## 2018-02-23 LAB — HEMOGLOBIN A1C
Hgb A1c MFr Bld: 9.1 % of total Hgb — ABNORMAL HIGH (ref <5.7)
Mean Plasma Glucose: 214 (calc)
eAG (mmol/L): 11.9 (calc)

## 2018-02-28 ENCOUNTER — Ambulatory Visit (INDEPENDENT_AMBULATORY_CARE_PROVIDER_SITE_OTHER): Payer: Medicare Other | Admitting: Family Medicine

## 2018-02-28 ENCOUNTER — Encounter: Payer: Self-pay | Admitting: Family Medicine

## 2018-02-28 VITALS — BP 150/60 | HR 93 | Resp 12 | Ht 66.0 in | Wt 234.1 lb

## 2018-02-28 DIAGNOSIS — M25561 Pain in right knee: Secondary | ICD-10-CM

## 2018-02-28 DIAGNOSIS — N184 Chronic kidney disease, stage 4 (severe): Secondary | ICD-10-CM

## 2018-02-28 DIAGNOSIS — Z794 Long term (current) use of insulin: Secondary | ICD-10-CM

## 2018-02-28 DIAGNOSIS — Z0000 Encounter for general adult medical examination without abnormal findings: Secondary | ICD-10-CM

## 2018-02-28 DIAGNOSIS — I10 Essential (primary) hypertension: Secondary | ICD-10-CM

## 2018-02-28 DIAGNOSIS — IMO0001 Reserved for inherently not codable concepts without codable children: Secondary | ICD-10-CM

## 2018-02-28 DIAGNOSIS — E1165 Type 2 diabetes mellitus with hyperglycemia: Secondary | ICD-10-CM

## 2018-02-28 DIAGNOSIS — G8929 Other chronic pain: Secondary | ICD-10-CM

## 2018-02-28 MED ORDER — GLIPIZIDE ER 10 MG PO TB24: ORAL_TABLET | ORAL | 6 refills | Status: DC

## 2018-02-28 MED ORDER — GLIPIZIDE ER 10 MG PO TB24: ORAL_TABLET | ORAL | 3 refills | Status: DC

## 2018-02-28 MED ORDER — FLUTICASONE PROPIONATE 50 MCG/ACT NA SUSP: 2.0000 | Freq: Every day | NASAL | 1 refills | Status: DC | PRN

## 2018-02-28 MED ORDER — CLONIDINE HCL 0.3 MG PO TABS: 0.3000 mg | ORAL_TABLET | Freq: Two times a day (BID) | ORAL | 3 refills | Status: DC

## 2018-02-28 NOTE — Patient Instructions (Addendum)
F/u in 6 to 8  weeks , call if you need me before  Increase clonidine to two times daily, 8am and 8 pm  You have an appt for eye exam, please keep   You are referred to diabetic class , need to go as sugar is still high  Stop furosemide  Stop ibuprofen  Make sure that you drink at least 64 ounces water every day  Use tylenol only for pain  Non fasting cmp  and eGFR, in 10 days, collect order at checkout  I will refer you to Dr Aline Brochure about your right leg pain and swelling for the past 1 week  Thank you  for choosing [facility]. We consider it a privelige to serve you.  Delivering excellent health care in a caring and  compassionate way is our goal.  Partnering with you,  so that together we can achieve this goal is our strategy.   Goal for fasting blood sugar ranges from 80 to 120 and 2 hours after any meal or at bedtime should be between 130 to 170.

## 2018-03-07 ENCOUNTER — Other Ambulatory Visit: Payer: Self-pay | Admitting: Family Medicine

## 2018-03-15 ENCOUNTER — Other Ambulatory Visit: Payer: Self-pay | Admitting: Family Medicine

## 2018-03-19 ENCOUNTER — Encounter: Payer: Self-pay | Admitting: Family Medicine

## 2018-03-19 DIAGNOSIS — N184 Chronic kidney disease, stage 4 (severe): Secondary | ICD-10-CM | POA: Insufficient documentation

## 2018-03-19 DIAGNOSIS — M25561 Pain in right knee: Secondary | ICD-10-CM | POA: Insufficient documentation

## 2018-03-19 NOTE — Assessment & Plan Note (Signed)
Deteriorated. Patient re-educated about  the importance of commitment to a  minimum of 150 minutes of exercise per week.  The importance of healthy food choices with portion control discussed. Encouraged to start a food diary, count calories and to consider  joining a support group. Sample diet sheets offered. Goals set by the patient for the next several months.   Weight /BMI 02/28/2018 12/27/2017 10/31/2017  WEIGHT 234 lb 1.9 oz 235 lb 235 lb  HEIGHT 5\' 6"  5\' 5"  5\' 5"   BMI 37.79 kg/m2 39.11 kg/m2 39.11 kg/m2

## 2018-03-19 NOTE — Progress Notes (Signed)
Kathleen [name] Lung     MRN: [id number]      DOB: Nov 26, 1950  HPI: Patient is in for annual physical exam. 1 week h/o right knee and leg swelling linmiting movement Recent labs,  are reviewed. Immunization is reviewed , and  updated if needed.   PE: BP (!) 150/60 (BP Location: Left Arm, Patient Position: Sitting, Cuff Size: Large)   Pulse 93   Resp 12   Ht 5\' 6"  (1.676 m)   Wt 234 lb 1.9 oz (106.2 kg)   SpO2 (!) 56%   BMI 37.79 kg/m   Pleasant  female, alert and oriented x 3, in no cardio-pulmonary distress. Afebrile. HEENT No facial trauma or asymetry. Sinuses non tender.  Extra occullar muscles intact, pupils equally reactive to light. External ears normal, tympanic membranes clear. Oropharynx moist, no exudate. Neck: supple, no adenopathy,JVD or thyromegaly.No bruits.  Chest: Clear to ascultation bilaterally.No crackles or wheezes. Non tender to palpation  Breast: No asymetry,no masses or lumps. No tenderness. No nipple discharge or inversion. No axillary or supraclavicular adenopathy  Cardiovascular system; Heart sounds normal,  S1 and  S2 ,no S3.  No murmur, or thrill. Apical beat not displaced Peripheral pulses normal.  Abdomen: Soft, non tender, no organomegaly or masses. No bruits. Bowel sounds normal. No guarding, tenderness or rebound.  .   Musculoskeletal exam:Decreased l ROM of spine, hips , shoulders and both  Knees.right greater than left  deformity ,swelling and  crepitus noted. No muscle wasting or atrophy.   Neurologic: Cranial nerves 2 to 12 intact. Power, tone ,sensation and reflexes normal throughout. No disturbance in gait. No tremor.  Skin: Intact, no ulceration, erythema ,tinea pedis and onychomycosis noted Pigmentation normal throughout  Psych; Normal mood and affect. Judgement and concentration normal   Assessment & Plan:  Annual physical exam Annual exam as documented. Counseling done  re healthy lifestyle involving  commitment to 150 minutes exercise per week, heart healthy diet, and attaining healthy weight.The importance of adequate sleep also discussed. Changes in health habits are decided on by the patient with goals and time frames  set for achieving them. Immunization and cancer screening needs are specifically addressed at this visit.   Essential hypertension Uncontrolled  Increase clonidine to twice daily DASH diet and commitment to daily physical activity for a minimum of 30 minutes discussed and encouraged, as a part of hypertension management. The importance of attaining a healthy weight is also discussed.  BP/Weight 02/28/2018 12/27/2017 10/31/2017 04/27/2017 02/14/2017 01/20/2017 5/46/5681  Systolic BP 275 170 017 494 496 759 163  Diastolic BP 60 72 62 68 64 70 82  Wt. (Lbs) 234.12 235 235 232.04 230.5 223 229  BMI 37.79 39.11 39.11 38.61 38.36 37.11 35.87        Morbid obesity (HCC) Deteriorated. Patient re-educated about  the importance of commitment to a  minimum of 150 minutes of exercise per week.  The importance of healthy food choices with portion control discussed. Encouraged to start a food diary, count calories and to consider  joining a support group. Sample diet sheets offered. Goals set by the patient for the next several months.   Weight /BMI 02/28/2018 12/27/2017 10/31/2017  WEIGHT 234 lb 1.9 oz 235 lb 235 lb  HEIGHT 5\' 6"  5\' 5"  5\' 5"   BMI 37.79 kg/m2 39.11 kg/m2 39.11 kg/m2      Diabetes mellitus, insulin dependent (IDDM), uncontrolled ([place]) Kathleen [name] is reminded of the importance of commitment to daily physical activity for  30 minutes or more, as able and the need to limit carbohydrate intake to 30 to 60 grams per meal to help with blood sugar control.   The need to take medication as prescribed, test blood sugar as directed, and to call between visits if there is a concern that blood sugar is uncontrolled is also discussed.   Kathleen [name] is reminded of the  importance of daily foot exam, annual eye examination, and good blood sugar, blood pressure and cholesterol control. Uncontrolled, refer to diabetic educator, also will benefit from management by Endo for her diabetes, I will continue to work on this Diabetic Labs Latest Ref Rng & Units 02/22/2018 10/27/2017 04/27/2017 01/06/2017 08/19/2016  HbA1c <5.7 % of total Hgb 9.1(H) 9.9(H) 7.8(H) 9.5(H) 9.1(H)  Microalbumin mg/dL - 0.4 - - -  Micro/Creat Ratio 0.0 - 30.0 mg/g creat - - - - -  Chol <200 mg/dL - 143 132 205(H) -  HDL >50 mg/dL - 36(L) 47(L) 44(L) -  Calc LDL mg/dL (calc) - 83 69 121(H) -  Triglycerides <150 mg/dL - 139 84 202(H) -  Creatinine 0.50 - 0.99 mg/dL 2.21(H) 1.72(H) 1.63(H) 1.38(H) 1.44(H)   BP/Weight 02/28/2018 12/27/2017 10/31/2017 04/27/2017 02/14/2017 01/20/2017 2/95/6213  Systolic BP 086 578 469 629 528 413 244  Diastolic BP 60 72 62 68 64 70 82  Wt. (Lbs) 234.12 235 235 232.04 230.5 223 229  BMI 37.79 39.11 39.11 38.61 38.36 37.11 35.87   Foot/eye exam completion dates Latest Ref Rng & Units 01/20/2017 10/08/2016  Eye Exam No Retinopathy - No Retinopathy  Foot Form Completion - Done -        Knee pain, right 2 week h/o right knee pain and swelling, limiting movement, refer to Ortho  CKD (chronic kidney disease) stage 4, GFR 15-29 ml/min (HCC) Progressive deterioration of kidney function, pt counseled re need to achieve control of her blood sugar and blood pressure as well as to use only tylenol for pain, to prevent further deterioration in her kidney function

## 2018-03-19 NOTE — Assessment & Plan Note (Signed)
Progressive deterioration of kidney function, pt counseled re need to achieve control of her blood sugar and blood pressure as well as to use only tylenol for pain, to prevent further deterioration in her kidney function

## 2018-03-19 NOTE — Assessment & Plan Note (Signed)
Annual exam as documented. Counseling done  re healthy lifestyle involving commitment to 150 minutes exercise per week, heart healthy diet, and attaining healthy weight.The importance of adequate sleep also discussed. Changes in health habits are decided on by the patient with goals and time frames  set for achieving them. Immunization and cancer screening needs are specifically addressed at this visit. 

## 2018-03-19 NOTE — Assessment & Plan Note (Signed)
2 week h/o right knee pain and swelling, limiting movement, refer to Ortho

## 2018-03-19 NOTE — Assessment & Plan Note (Signed)
Uncontrolled  Increase clonidine to twice daily DASH diet and commitment to daily physical activity for a minimum of 30 minutes discussed and encouraged, as a part of hypertension management. The importance of attaining a healthy weight is also discussed.  BP/Weight 02/28/2018 12/27/2017 10/31/2017 04/27/2017 02/14/2017 01/20/2017 9/98/7215  Systolic BP 872 761 848 592 763 943 200  Diastolic BP 60 72 62 68 64 70 82  Wt. (Lbs) 234.12 235 235 232.04 230.5 223 229  BMI 37.79 39.11 39.11 38.61 38.36 37.11 35.87

## 2018-03-19 NOTE — Assessment & Plan Note (Addendum)
Kathleen [name] is reminded of the importance of commitment to daily physical activity for 30 minutes or more, as able and the need to limit carbohydrate intake to 30 to 60 grams per meal to help with blood sugar control.   The need to take medication as prescribed, test blood sugar as directed, and to call between visits if there is a concern that blood sugar is uncontrolled is also discussed.   Kathleen [name] is reminded of the importance of daily foot exam, annual eye examination, and good blood sugar, blood pressure and cholesterol control. Uncontrolled, refer to diabetic educator, also will benefit from management by Endo for her diabetes, I will continue to work on this Diabetic Labs Latest Ref Rng & Units 02/22/2018 10/27/2017 04/27/2017 01/06/2017 08/19/2016  HbA1c <5.7 % of total Hgb 9.1(H) 9.9(H) 7.8(H) 9.5(H) 9.1(H)  Microalbumin mg/dL - 0.4 - - -  Micro/Creat Ratio 0.0 - 30.0 mg/g creat - - - - -  Chol <200 mg/dL - 143 132 205(H) -  HDL >50 mg/dL - 36(L) 47(L) 44(L) -  Calc LDL mg/dL (calc) - 83 69 121(H) -  Triglycerides <150 mg/dL - 139 84 202(H) -  Creatinine 0.50 - 0.99 mg/dL 2.21(H) 1.72(H) 1.63(H) 1.38(H) 1.44(H)   BP/Weight 02/28/2018 12/27/2017 10/31/2017 04/27/2017 02/14/2017 01/20/2017 6/97/9480  Systolic BP 165 537 482 707 867 544 920  Diastolic BP 60 72 62 68 64 70 82  Wt. (Lbs) 234.12 235 235 232.04 230.5 223 229  BMI 37.79 39.11 39.11 38.61 38.36 37.11 35.87   Foot/eye exam completion dates Latest Ref Rng & Units 01/20/2017 10/08/2016  Eye Exam No Retinopathy - No Retinopathy  Foot Form Completion - Done -

## 2018-03-20 ENCOUNTER — Telehealth: Payer: Self-pay

## 2018-03-20 DIAGNOSIS — Z794 Long term (current) use of insulin: Principal | ICD-10-CM

## 2018-03-20 DIAGNOSIS — E1165 Type 2 diabetes mellitus with hyperglycemia: Principal | ICD-10-CM

## 2018-03-20 DIAGNOSIS — IMO0001 Reserved for inherently not codable concepts without codable children: Secondary | ICD-10-CM

## 2018-03-20 NOTE — Telephone Encounter (Signed)
-----  Message from Fayrene Helper, MD sent at 03/19/2018  5:20 AM EST ----- Regarding: needs wellness please see if can schedule same day she has f/u with ,me Around the same time   Also needs to collect lab order which she should have got at [facility] , needs non fasting chem 7 and EGFR, which are due this week, dx is CKD  pls help!  ( difficult to get her  On tele I know, so ... We will see!)

## 2018-04-03 ENCOUNTER — Encounter: Payer: Self-pay | Admitting: *Deleted

## 2018-04-03 ENCOUNTER — Ambulatory Visit (INDEPENDENT_AMBULATORY_CARE_PROVIDER_SITE_OTHER): Payer: Medicare Other | Admitting: Family Medicine

## 2018-04-03 ENCOUNTER — Encounter: Payer: Self-pay | Admitting: Family Medicine

## 2018-04-03 VITALS — BP 148/80 | HR 100 | Resp 16 | Ht 66.0 in | Wt 236.0 lb

## 2018-04-03 DIAGNOSIS — Z794 Long term (current) use of insulin: Secondary | ICD-10-CM | POA: Diagnosis not present

## 2018-04-03 DIAGNOSIS — IMO0001 Reserved for inherently not codable concepts without codable children: Secondary | ICD-10-CM

## 2018-04-03 DIAGNOSIS — E785 Hyperlipidemia, unspecified: Secondary | ICD-10-CM

## 2018-04-03 DIAGNOSIS — I10 Essential (primary) hypertension: Secondary | ICD-10-CM | POA: Diagnosis not present

## 2018-04-03 DIAGNOSIS — M20009 Unspecified deformity of unspecified finger(s): Secondary | ICD-10-CM | POA: Insufficient documentation

## 2018-04-03 DIAGNOSIS — E1165 Type 2 diabetes mellitus with hyperglycemia: Secondary | ICD-10-CM | POA: Diagnosis not present

## 2018-04-03 DIAGNOSIS — M20002 Unspecified deformity of left finger(s): Secondary | ICD-10-CM

## 2018-04-03 MED ORDER — CLONIDINE HCL 0.3 MG PO TABS: 0.3000 mg | ORAL_TABLET | Freq: Three times a day (TID) | ORAL | 3 refills | Status: DC

## 2018-04-03 NOTE — Patient Instructions (Addendum)
F/U last week In  January, call if you need me before  Fasting lipid, cmp and EGFr and HBa1C 5 to 7  days before next appointment in January  X ray of left ring finger today  We will add uric acid level and rheumatoid factor to lab drawn today  Sugars seem  better, please keeep appt with educator  Blood pressure still tool high, increase clonidine  to 3 times daily,  7 am , 3pm and 10pm  Thank you  for choosing [facility]. We consider it a privelige to serve you.  Delivering excellent health care in a caring and  compassionate way is our goal.  Partnering with you,  so that together we can achieve this goal is our strategy.           

## 2018-04-04 LAB — BASIC METABOLIC PANEL WITH GFR
BUN/Creatinine Ratio: 16 (calc) (ref 6–22)
BUN: 25 mg/dL (ref 7–25)
CALCIUM: 9.6 mg/dL (ref 8.6–10.4)
CO2: 24 mmol/L (ref 20–32)
Chloride: 104 mmol/L (ref 98–110)
Creat: 1.61 mg/dL — ABNORMAL HIGH (ref 0.50–0.99)
GFR, EST AFRICAN AMERICAN: 38 mL/min/{1.73_m2} — AB (ref >=60)
GFR, EST NON AFRICAN AMERICAN: 33 mL/min/{1.73_m2} — AB (ref >=60)
Glucose, Bld: 67 mg/dL (ref 65–99)
Potassium: 4.5 mmol/L (ref 3.5–5.3)
SODIUM: 136 mmol/L (ref 135–146)

## 2018-04-05 NOTE — Progress Notes (Signed)
Kathleen [name]     MRN: [id number]      DOB: 08-17-1950   HPI Ms. Remick is here for follow up and re-evaluation of chronic medical conditions, in particular uncontrolled hypertension and diabetes, alsomedication management and review of any available recent lab and radiology data.  Preventive health is updated, specifically  Cancer screening and Immunization.   Questions or concerns regarding consultations or procedures which the PT has had in the interim are  addressed. The PT denies any adverse reactions to current medications since the last visit.  Denies polyuria, polydipsia, blurred vision , or hypoglycemic episodes. Reports improved blood sugars though did not bring tablet or meter C/o swollen and deformed left ring finger , no significant pain, occurs intermittently for years  ROS Denies recent fever or chills. Denies sinus pressure, nasal congestion, ear pain or sore throat. Denies chest congestion, productive cough or wheezing. Denies chest pains, palpitations and leg swelling Denies abdominal pain, nausea, vomiting,diarrhea or constipation.   Denies dysuria, frequency, hesitancy or incontinence. Denies joint pain, swelling and limitation in mobility. Denies headaches, seizures, numbness, or tingling. Denies depression, anxiety or insomnia. Denies skin break down or rash.   PE  BP (!) 148/80   Pulse 100   Resp 16   Ht 5\' 6"  (1.676 m)   Wt 236 lb (107 kg)   SpO2 96%   BMI 38.09 kg/m   Patient alert and oriented and in no cardiopulmonary distress.  HEENT: No facial asymmetry, EOMI,   oropharynx pink and moist.  Neck supple no JVD, no mass.  Chest: Clear to auscultation bilaterally.  CVS: S1, S2 no murmurs, no S3.Regular rate.  ABD: Soft non tender.   Ext: No edema  MS: Adequate ROM spine, shoulders, hips and knees.Rheumatoid and swan neck deformity of left ring finger  Skin: Intact, no ulcerations or rash noted.  Psych: Good eye contact, normal affect.  Memory intact not anxious or depressed appearing.  CNS: CN 2-12 intact, power,  normal throughout.no focal deficits noted.   Assessment & Plan  Essential hypertension Uncontrolled , increase dose of clonidine DASH diet and commitment to daily physical activity for a minimum of 30 minutes discussed and encouraged, as a part of hypertension management. The importance of attaining a healthy weight is also discussed.  BP/Weight 04/03/2018 02/28/2018 12/27/2017 10/31/2017 04/27/2017 02/14/2017 9/50/9326  Systolic BP 712 458 099 833 825 053 976  Diastolic BP 80 60 72 62 68 64 70  Wt. (Lbs) 236 234.12 235 235 232.04 230.5 223  BMI 38.09 37.79 39.11 39.11 38.61 38.36 37.11       Morbid obesity (HCC) Deteriorated. Patient re-educated about  the importance of commitment to a  minimum of 150 minutes of exercise per week.  The importance of healthy food choices with portion control discussed. Encouraged to start a food diary, count calories and to consider  joining a support group. Sample diet sheets offered. Goals set by the patient for the next several months.   Weight /BMI 04/03/2018 02/28/2018 12/27/2017  WEIGHT 236 lb 234 lb 1.9 oz 235 lb  HEIGHT 5\' 6"  5\' 6"  5\' 5"   BMI 38.09 kg/m2 37.79 kg/m2 39.11 kg/m2      Finger deformity, acquired Intermittent painless swelling of finger for several years, no recent trauma, X ray of finger, clinically appears to be rheumatoid deformity  Diabetes mellitus, insulin dependent (IDDM), uncontrolled ([place]) Reports improved blood sugar values , she is encouraged to keep appt with educator as she  Needs  this Ms. Rinck is reminded of the importance of commitment to daily physical activity for 30 minutes or more, as able and the need to limit carbohydrate intake to 30 to 60 grams per meal to help with blood sugar control.   The need to take medication as prescribed, test blood sugar as directed, and to call between visits if there is a concern that  blood sugar is uncontrolled is also discussed.   Ms. Lai is reminded of the importance of daily foot exam, annual eye examination, and good blood sugar, blood pressure and cholesterol control.  Diabetic Labs Latest Ref Rng & Units 04/03/2018 02/22/2018 10/27/2017 04/27/2017 01/06/2017  HbA1c <5.7 % of total Hgb - 9.1(H) 9.9(H) 7.8(H) 9.5(H)  Microalbumin mg/dL - - 0.4 - -  Micro/Creat Ratio 0.0 - 30.0 mg/g creat - - - - -  Chol <200 mg/dL - - 143 132 205(H)  HDL >50 mg/dL - - 36(L) 47(L) 44(L)  Calc LDL mg/dL (calc) - - 83 69 121(H)  Triglycerides <150 mg/dL - - 139 84 202(H)  Creatinine 0.50 - 0.99 mg/dL 1.61(H) 2.21(H) 1.72(H) 1.63(H) 1.38(H)   BP/Weight 04/03/2018 02/28/2018 12/27/2017 10/31/2017 04/27/2017 02/14/2017 12/27/6013  Systolic BP 615 379 432 761 470 929 574  Diastolic BP 80 60 72 62 68 64 70  Wt. (Lbs) 236 234.12 235 235 232.04 230.5 223  BMI 38.09 37.79 39.11 39.11 38.61 38.36 37.11   Foot/eye exam completion dates Latest Ref Rng & Units 02/28/2018 01/20/2017  Eye Exam No Retinopathy - -  Foot Form Completion - Done Done

## 2018-04-05 NOTE — Assessment & Plan Note (Signed)
Intermittent painless swelling of finger for several years, no recent trauma, X ray of finger, clinically appears to be rheumatoid deformity

## 2018-04-05 NOTE — Assessment & Plan Note (Signed)
Reports improved blood sugar values , she is encouraged to keep appt with educator as she  Needs this Ms. Lipford is reminded of the importance of commitment to daily physical activity for 30 minutes or more, as able and the need to limit carbohydrate intake to 30 to 60 grams per meal to help with blood sugar control.   The need to take medication as prescribed, test blood sugar as directed, and to call between visits if there is a concern that blood sugar is uncontrolled is also discussed.   Ms. Dezeeuw is reminded of the importance of daily foot exam, annual eye examination, and good blood sugar, blood pressure and cholesterol control.  Diabetic Labs Latest Ref Rng & Units 04/03/2018 02/22/2018 10/27/2017 04/27/2017 01/06/2017  HbA1c <5.7 % of total Hgb - 9.1(H) 9.9(H) 7.8(H) 9.5(H)  Microalbumin mg/dL - - 0.4 - -  Micro/Creat Ratio 0.0 - 30.0 mg/g creat - - - - -  Chol <200 mg/dL - - 143 132 205(H)  HDL >50 mg/dL - - 36(L) 47(L) 44(L)  Calc LDL mg/dL (calc) - - 83 69 121(H)  Triglycerides <150 mg/dL - - 139 84 202(H)  Creatinine 0.50 - 0.99 mg/dL 1.61(H) 2.21(H) 1.72(H) 1.63(H) 1.38(H)   BP/Weight 04/03/2018 02/28/2018 12/27/2017 10/31/2017 04/27/2017 02/14/2017 01/24/6059  Systolic BP 045 997 741 423 953 202 334  Diastolic BP 80 60 72 62 68 64 70  Wt. (Lbs) 236 234.12 235 235 232.04 230.5 223  BMI 38.09 37.79 39.11 39.11 38.61 38.36 37.11   Foot/eye exam completion dates Latest Ref Rng & Units 02/28/2018 01/20/2017  Eye Exam No Retinopathy - -  Foot Form Completion - Done Done

## 2018-04-05 NOTE — Assessment & Plan Note (Signed)
Uncontrolled , increase dose of clonidine DASH diet and commitment to daily physical activity for a minimum of 30 minutes discussed and encouraged, as a part of hypertension management. The importance of attaining a healthy weight is also discussed.  BP/Weight 04/03/2018 02/28/2018 12/27/2017 10/31/2017 04/27/2017 02/14/2017 10/27/5091  Systolic BP 267 124 580 998 338 250 539  Diastolic BP 80 60 72 62 68 64 70  Wt. (Lbs) 236 234.12 235 235 232.04 230.5 223  BMI 38.09 37.79 39.11 39.11 38.61 38.36 37.11

## 2018-04-05 NOTE — Assessment & Plan Note (Signed)
Deteriorated. Patient re-educated about  the importance of commitment to a  minimum of 150 minutes of exercise per week.  The importance of healthy food choices with portion control discussed. Encouraged to start a food diary, count calories and to consider  joining a support group. Sample diet sheets offered. Goals set by the patient for the next several months.   Weight /BMI 04/03/2018 02/28/2018 12/27/2017  WEIGHT 236 lb 234 lb 1.9 oz 235 lb  HEIGHT 5\' 6"  5\' 6"  5\' 5"   BMI 38.09 kg/m2 37.79 kg/m2 39.11 kg/m2

## 2018-04-12 ENCOUNTER — Other Ambulatory Visit: Payer: Self-pay

## 2018-04-12 DIAGNOSIS — E785 Hyperlipidemia, unspecified: Secondary | ICD-10-CM

## 2018-04-12 MED ORDER — PRAVASTATIN SODIUM 20 MG PO TABS: 20.0000 mg | ORAL_TABLET | Freq: Every day | ORAL | 2 refills | Status: DC

## 2018-04-12 NOTE — Progress Notes (Signed)
Pravastatin reordered to [facility]

## 2018-04-17 ENCOUNTER — Telehealth: Payer: Self-pay | Admitting: Nutrition

## 2018-04-17 ENCOUNTER — Ambulatory Visit: Payer: Medicare Other | Admitting: Nutrition

## 2018-04-17 NOTE — Telephone Encounter (Signed)
VM to call and reschedule missed appt.

## 2018-04-24 ENCOUNTER — Encounter: Payer: Medicare Other | Attending: Family Medicine | Admitting: Nutrition

## 2018-05-05 ENCOUNTER — Other Ambulatory Visit: Payer: Self-pay | Admitting: Family Medicine

## 2018-05-23 ENCOUNTER — Encounter: Payer: Self-pay | Admitting: Orthopedic Surgery

## 2018-06-06 LAB — HM DIABETES EYE EXAM

## 2018-06-07 ENCOUNTER — Ambulatory Visit: Payer: Medicare Other | Admitting: Family Medicine

## 2018-06-07 ENCOUNTER — Other Ambulatory Visit: Payer: Self-pay | Admitting: Family Medicine

## 2018-06-13 ENCOUNTER — Ambulatory Visit: Payer: Medicare Other | Admitting: Family Medicine

## 2018-06-22 ENCOUNTER — Ambulatory Visit (INDEPENDENT_AMBULATORY_CARE_PROVIDER_SITE_OTHER): Payer: Medicare Other | Admitting: Family Medicine

## 2018-06-22 ENCOUNTER — Encounter: Payer: Self-pay | Admitting: Family Medicine

## 2018-06-22 VITALS — BP 122/82 | HR 90 | Resp 12 | Ht 66.0 in | Wt 234.0 lb

## 2018-06-22 DIAGNOSIS — E785 Hyperlipidemia, unspecified: Secondary | ICD-10-CM | POA: Diagnosis not present

## 2018-06-22 DIAGNOSIS — N183 Chronic kidney disease, stage 3 unspecified: Secondary | ICD-10-CM

## 2018-06-22 DIAGNOSIS — IMO0001 Reserved for inherently not codable concepts without codable children: Secondary | ICD-10-CM

## 2018-06-22 DIAGNOSIS — Z794 Long term (current) use of insulin: Secondary | ICD-10-CM

## 2018-06-22 DIAGNOSIS — E1165 Type 2 diabetes mellitus with hyperglycemia: Secondary | ICD-10-CM

## 2018-06-22 DIAGNOSIS — I10 Essential (primary) hypertension: Secondary | ICD-10-CM | POA: Diagnosis not present

## 2018-06-22 DIAGNOSIS — E1122 Type 2 diabetes mellitus with diabetic chronic kidney disease: Secondary | ICD-10-CM | POA: Diagnosis not present

## 2018-06-22 MED ORDER — GLUCOSE BLOOD VI STRP: ORAL_STRIP | 99 refills | Status: DC

## 2018-06-22 MED ORDER — SPIRONOLACTONE-HCTZ 25-25 MG PO TABS: 1.0000 | ORAL_TABLET | Freq: Every day | ORAL | 0 refills | Status: DC

## 2018-06-22 NOTE — Progress Notes (Signed)
Established Patient Office Visit  Subjective:  Patient ID: Kathleen [name], female    DOB: 1950-10-09  Age: 68 y.o. MRN: [id number]  CC:  Chief Complaint  Patient presents with  . Diabetes    follow up   . Hypertension    follow up   . chronic kidney disease    follow up     HPI [name] presents for follow up regarding her DM, HTN, and CKD (stage4). She is maintained on  Lanuts, Januvia, glipized, Norvasc, Catapres, Altace, Aldactazide, Statin, and ASA. She reports taking all the medications as directed.   She was last seen in the office in Nov. At that time her BP was elevated 148/80. Dr Moshe Cipro increased her Clonidine to 3 times daily. She reports doing this and tolerating it well.   She reports running out of there test strips and so has not been checking her sugar. She does however report when she was check the morning check as 90-130. She states she looks at her feet daily and needs an appt to the foot dr for nail care. She denies polyuria, polydipsia, blurred vision, or hypoglycemic episodes  She has no signs or reports of SOB, wheezing, or trouble breathing. She denies chest pain and palpitations. She denies any swelling of legs or other parts of the body.   She forgot to get her labs before this visit. She is fasting though, only had coffee and artifical sweetener in it several hours ago.    Past Medical History:  Diagnosis Date  . Anemia   . Arthritis   . Chronic kidney disease   . Diabetes mellitus type II   . Dysphagia    unspecified   . GERD (gastroesophageal reflux disease)   . Hyperlipidemia 2000  . Hypertension 1995  . Insomnia   . Low back pain   . Obesity     Past Surgical History:  Procedure Laterality Date  . Carpal tunnel release     left   . CHOLECYSTECTOMY  2007  . DIGIT NAIL REMOVAL  08/2011  . KNEE SURGERY  1.20.2012   arthroscopy LEFT knee partial medial meniscectomy  . TENOTOMY     2,3,4  left foot   . toenail removal    .  TUBAL LIGATION      Family History  Problem Relation Age of Onset  . Diabetes Mother   . Hypertension Mother        MI  . Heart disease Mother   . Kidney disease Mother 60       dialysis  . Diabetes Sister   . Diabetes Brother   . Diabetes Brother   . Kidney disease Brother   . Dementia Sister   . Diabetes Father     Social History   Socioeconomic History  . Marital status: Divorced    Spouse name: Not on file  . Number of children: 4  . Years of education: Not on file  . Highest education level: 11th grade  Occupational History  . Occupation: Full time Engineer, petroleum   Social Needs  . Financial resource strain: Not hard at all  . Food insecurity:    Worry: Never true    Inability: Never true  . Transportation needs:    Medical: No    Non-medical: No  Tobacco Use  . Smoking status: Never Smoker  . Smokeless tobacco: Never Used  Substance and Sexual Activity  . Alcohol use: No  . Drug use: No  .  Sexual activity: Yes  Lifestyle  . Physical activity:    Days per week: 5 days    Minutes per session: 30 min  . Stress: Not at all  Relationships  . Social connections:    Talks on phone: More than three times a week    Gets together: More than three times a week    Attends religious service: More than 4 times per year    Active member of club or organization: No    Attends meetings of clubs or organizations: Never    Relationship status: Divorced  . Intimate partner violence:    Fear of current or ex partner: No    Emotionally abused: No    Physically abused: No    Forced sexual activity: No  Other Topics Concern  . Not on file  Social History Narrative   Lives with 2nd oldest son    Outpatient Medications Prior to Visit  Medication Sig Dispense Refill  . ACCU-CHEK AVIVA PLUS test strip USE TO TEST 3 TIMES DAILY. 100 each PRN  . ACCU-CHEK SOFTCLIX LANCETS lancets USE TO TEST 3 TIMES DAILY. 100 each PRN  . acetaminophen (TYLENOL ARTHRITIS PAIN) 650 MG CR  tablet Take 650 mg by mouth 3 (three) times daily as needed. 1-2 by mouth three times a day as needed pain     . amLODipine (NORVASC) 10 MG tablet TAKE ONE TABLET BY MOUTH ONCE DAILY FOR BLOOD PRESSURE. 90 tablet 1  . aspirin (ASPIRIN LOW DOSE) 81 MG tablet Take 81 mg by mouth at bedtime.     . B-D ULTRAFINE III SHORT PEN 31G X 8 MM MISC USE DAILY WITH LANTUS. 100 each 0  . blood glucose meter kit and supplies Test three times daily dx e11.65 accuchek aviva plus if covered 1 each 0  . cholecalciferol (VITAMIN D) 1000 UNITS tablet Take 1,000 Units by mouth daily.    . cloNIDine (CATAPRES) 0.3 MG tablet Take 1 tablet (0.3 mg total) by mouth 3 (three) times daily. 90 tablet 3  . fluticasone (FLONASE) 50 MCG/ACT nasal spray Place 2 sprays into both nostrils daily as needed. 15 g 1  . glipiZIDE (GLUCOTROL XL) 10 MG 24 hr tablet Take two tablets every morning with breakfast 180 tablet 3  . Insulin Glargine (LANTUS SOLOSTAR) 100 UNIT/ML Solostar Pen INJECT 100 UNITS INTO THE SKIN AT 10 PM. 30 mL 5  . pravastatin (PRAVACHOL) 20 MG tablet Take 1 tablet (20 mg total) by mouth daily. 90 tablet 2  . ramipril (ALTACE) 10 MG capsule TAKE (1) CAPSULE BY MOUTH ONCE DAILY. 90 capsule 1  . spironolactone-hydrochlorothiazide (ALDACTAZIDE) 25-25 MG tablet TAKE ONE TABLET BY MOUTH ONCE DAILY. 90 tablet 0   No facility-administered medications prior to visit.     No Known Allergies  ROS Review of Systems  Constitutional: Negative.   Eyes: Negative for visual disturbance.  Respiratory: Negative for cough, chest tightness, shortness of breath and wheezing.   Cardiovascular: Negative for chest pain, palpitations and leg swelling.  Endocrine: Negative for polydipsia, polyphagia and polyuria.  Skin: Negative for rash and wound.  Neurological: Negative for dizziness, weakness and headaches.      Objective:    Physical Exam  Constitutional: She is oriented to person, place, and time. She appears well-developed  and well-nourished.  HENT:  Head: Normocephalic.  Cardiovascular: Normal rate, regular rhythm, normal heart sounds and intact distal pulses.  Pulmonary/Chest: Effort normal and breath sounds normal.  Abdominal: Soft. Bowel sounds are normal.  Musculoskeletal: Normal range of motion.  Neurological: She is alert and oriented to person, place, and time. She has normal strength. No sensory deficit.  Simple foot exam in flowsheet  Skin: Skin is warm and dry.  Psychiatric: She has a normal mood and affect. Her behavior is normal. Judgment and thought content normal.  Nursing note and vitals reviewed.   BP 122/82   Pulse 90   Resp 12   Ht 5' 6"  (1.676 m)   Wt 234 lb (106.1 kg)   SpO2 97% Comment: room air  BMI 37.77 kg/m  Wt Readings from Last 3 Encounters:  06/22/18 234 lb (106.1 kg)  04/03/18 236 lb (107 kg)  02/28/18 234 lb 1.9 oz (106.2 kg)     Lab Results  Component Value Date   NA 136 04/03/2018   K 4.5 04/03/2018   CO2 24 04/03/2018   GLUCOSE 67 04/03/2018   BUN 25 04/03/2018   CREATININE 1.61 (H) 04/03/2018   BILITOT 0.3 10/27/2017   ALKPHOS 94 01/06/2017   AST 8 (L) 10/27/2017   ALT 10 10/27/2017   PROT 7.3 10/27/2017   ALBUMIN 4.0 01/06/2017   CALCIUM 9.6 04/03/2018    Lab Results  Component Value Date   HGBA1C 9.1 (H) 02/22/2018      Assessment & Plan:   1. Essential hypertension Today in office BP is 122/82, better than previous OV. Increase in Clonidine has helped.  Encouraged to continue this medication regimen, but to also start walking 20-30 minutes on most days of the week.  Goal of 150 minutes a week.   - spironolactone-hydrochlorothiazide (ALDACTAZIDE) 25-25 MG tablet; Take 1 tablet by mouth daily.  Dispense: 90 tablet; Refill: 0  2. Diabetes mellitus, insulin dependent (IDDM), uncontrolled ([place]) Last A1c from Nov was 9.1. She forgot to get labs before this appt. Asked her to go right after cause she is fasting. Will order A1c for next visit  in 3 months, until there is demonstrated control. She was out of test strips. Sent those in for refill. Educated on checking CBG 3 times daily.   - glucose blood (ACCU-CHEK AVIVA PLUS) test strip; USE TO TEST 3 TIMES DAILY.  Dispense: 100 each; Refill: PRN  3. CKD (chronic kidney disease) stage 4, GFR 15-29 ml/min (HCC) Last check her GFR had some improvement. Will assess this on this next labs drawn today. No complaints of fluid overload today, nor seen on exam.   4. Type 2 diabetes mellitus with stage 4 chronic kidney disease, with long-term current use of insulin ([place]) Long term use of insulin for DM control, which is currently not controlled.  Educated on checking blood sugar. Foods choices and walking to get exercise.     5. Morbid obesity ([place]) This is unchanged. She reports eating better, but still admits to trouble with food choices.  Educated on the importance of picking healthy options. She does try for wheat bread over the white. Encouraged to continue this. She set a goal of 5 pounds lost in the next 3 months.     Follow-up:  09/20/2018   Perlie Mayo, NP

## 2018-06-22 NOTE — Patient Instructions (Addendum)
    Thank you for coming into the office today. I appreciate the opportunity to provide you with the care for your health and wellness.  Please go next door and get your labs: lipid, CMP with GFR, and A1c. We will notify you with the results.  Please get your A1c and CMP before your next 3 month visit.  Your blood pressure is great today in the office: 122/82  Continue to work on food choices and exercise (20-30 minutes) to help elevate HR. Aim for 150 minutes of exercise a week.  GOAL: 3 months to lose 5 pound with diet and exercise.   Refills of strips, Aldactazide were sent to pharmacy.   Foot exam is good today.  It was a pleasure to see you and I look forward to continuing to work together on your health and well-being. Please do not hesitate to call the office if you need care or have questions about your care.  Have a wonderful day and week.  With Gratitude,  Cherly Beach, DNP, AGNP-BC

## 2018-06-23 ENCOUNTER — Other Ambulatory Visit: Payer: Self-pay | Admitting: Family Medicine

## 2018-06-23 DIAGNOSIS — IMO0001 Reserved for inherently not codable concepts without codable children: Secondary | ICD-10-CM

## 2018-06-23 DIAGNOSIS — E1165 Type 2 diabetes mellitus with hyperglycemia: Secondary | ICD-10-CM

## 2018-06-23 DIAGNOSIS — Z794 Long term (current) use of insulin: Principal | ICD-10-CM

## 2018-06-23 DIAGNOSIS — E1121 Type 2 diabetes mellitus with diabetic nephropathy: Secondary | ICD-10-CM

## 2018-06-23 LAB — COMPLETE METABOLIC PANEL WITH GFR
AG RATIO: 1.1 (calc) (ref 1.0–2.5)
ALT: 12 U/L (ref 6–29)
AST: 12 U/L (ref 10–35)
Albumin: 4.1 g/dL (ref 3.6–5.1)
Alkaline phosphatase (APISO): 91 U/L (ref 37–153)
BUN / CREAT RATIO: 15 (calc) (ref 6–22)
BUN: 23 mg/dL (ref 7–25)
CALCIUM: 9.4 mg/dL (ref 8.6–10.4)
CO2: 25 mmol/L (ref 20–32)
Chloride: 101 mmol/L (ref 98–110)
Creat: 1.54 mg/dL — ABNORMAL HIGH (ref 0.50–0.99)
GFR, EST AFRICAN AMERICAN: 39 mL/min/{1.73_m2} — AB (ref >=60)
GFR, EST NON AFRICAN AMERICAN: 34 mL/min/{1.73_m2} — AB (ref >=60)
Globulin: 3.7 g/dL (calc) (ref 1.9–3.7)
Glucose, Bld: 151 mg/dL — ABNORMAL HIGH (ref 65–99)
POTASSIUM: 4.5 mmol/L (ref 3.5–5.3)
SODIUM: 135 mmol/L (ref 135–146)
Total Bilirubin: 0.4 mg/dL (ref 0.2–1.2)
Total Protein: 7.8 g/dL (ref 6.1–8.1)

## 2018-06-23 LAB — HEMOGLOBIN A1C
EAG (MMOL/L): 13 (calc)
HEMOGLOBIN A1C: 9.8 %{Hb} — AB (ref <5.7)
MEAN PLASMA GLUCOSE: 235 (calc)

## 2018-06-23 LAB — LIPID PANEL
CHOL/HDL RATIO: 3.6 (calc) (ref <5.0)
CHOLESTEROL: 140 mg/dL (ref <200)
HDL: 39 mg/dL — AB (ref >=50)
LDL Cholesterol (Calc): 80 mg/dL (calc)
Non-HDL Cholesterol (Calc): 101 mg/dL (calc) (ref <130)
Triglycerides: 117 mg/dL (ref <150)

## 2018-08-07 ENCOUNTER — Ambulatory Visit: Payer: Self-pay | Admitting: "Endocrinology

## 2018-08-25 ENCOUNTER — Other Ambulatory Visit: Payer: Self-pay | Admitting: Family Medicine

## 2018-09-03 ENCOUNTER — Encounter: Payer: Self-pay | Admitting: Orthopedic Surgery

## 2018-09-19 ENCOUNTER — Ambulatory Visit: Payer: Medicare Other | Admitting: Family Medicine

## 2018-09-20 ENCOUNTER — Encounter: Payer: Self-pay | Admitting: Family Medicine

## 2018-09-20 ENCOUNTER — Ambulatory Visit: Payer: Medicare Other | Admitting: Family Medicine

## 2018-09-20 ENCOUNTER — Ambulatory Visit (INDEPENDENT_AMBULATORY_CARE_PROVIDER_SITE_OTHER): Payer: Medicare Other | Admitting: Family Medicine

## 2018-09-20 VITALS — BP 122/82 | Ht 66.0 in | Wt 223.0 lb

## 2018-09-20 DIAGNOSIS — Z0000 Encounter for general adult medical examination without abnormal findings: Secondary | ICD-10-CM

## 2018-09-20 NOTE — Patient Instructions (Signed)
[name] , Thank you for taking time to come for your Medicare Wellness Visit. I appreciate your ongoing commitment to your health goals. Please review the following plan we discussed and let me know if I can assist you in the future.   Please continue to take your medications as directed.  Let us know if you have any difficulties or trouble with them.  Please continue to practice social distancing during this time to keep you, your family, and our community safe.  Screening recommendations/referrals: Colonoscopy: Due 2021 Mammogram: Due July 2020 Bone Density: done Recommended yearly ophthalmology/optometry visit for glaucoma screening and checkup Recommended yearly dental visit for hygiene and checkup  Vaccinations: Influenza vaccine: due fall 2020 Pneumococcal vaccine: done Tdap vaccine: done Shingles vaccine: done  Conditions/risks identified: done  Next appointment: 5/18 at 1   Preventive Care 65 Years and Older, Female Preventive care refers to lifestyle choices and visits with your health care provider that can promote health and wellness. What does preventive care include?  A yearly physical exam. This is also called an annual well check.  Dental exams once or twice a year.  Routine eye exams. Ask your health care provider how often you should have your eyes checked.  Personal lifestyle choices, including:  Daily care of your teeth and gums.  Regular physical activity.  Eating a healthy diet.  Avoiding tobacco and drug use.  Limiting alcohol use.  Practicing safe sex.  Taking low-dose aspirin every day.  Taking vitamin and mineral supplements as recommended by your health care provider. What happens during an annual well check? The services and screenings done by your health care provider during your annual well check will depend on your age, overall health, lifestyle risk factors, and family history of disease. Counseling  Your health care provider  may ask you questions about your:  Alcohol use.  Tobacco use.  Drug use.  Emotional well-being.  Home and relationship well-being.  Sexual activity.  Eating habits.  History of falls.  Memory and ability to understand (cognition).  Work and work Statistician.  Reproductive health. Screening  You may have the following tests or measurements:  Height, weight, and BMI.  Blood pressure.  Lipid and cholesterol levels. These may be checked every 5 years, or more frequently if you are over 62 years old.  Skin check.  Lung cancer screening. You may have this screening every year starting at age 108 if you have a 30-pack-year history of smoking and currently smoke or have quit within the past 15 years.  Fecal occult blood test (FOBT) of the stool. You may have this test every year starting at age 7.  Flexible sigmoidoscopy or colonoscopy. You may have a sigmoidoscopy every 5 years or a colonoscopy every 10 years starting at age 5.  Hepatitis C blood test.  Hepatitis B blood test.  Sexually transmitted disease (STD) testing.  Diabetes screening. This is done by checking your blood sugar (glucose) after you have not eaten for a while (fasting). You may have this done every 1-3 years.  Bone density scan. This is done to screen for osteoporosis. You may have this done starting at age 33.  Mammogram. This may be done every 1-2 years. Talk to your health care provider about how often you should have regular mammograms. Talk with your health care provider about your test results, treatment options, and if necessary, the need for more tests. Vaccines  Your health care provider may recommend certain vaccines, such as:  Influenza vaccine. This is recommended every year.  Tetanus, diphtheria, and acellular pertussis (Tdap, Td) vaccine. You may need a Td booster every 10 years.  Zoster vaccine. You may need this after age 24.  Pneumococcal 13-valent conjugate (PCV13) vaccine.  One dose is recommended after age 9.  Pneumococcal polysaccharide (PPSV23) vaccine. One dose is recommended after age 72. Talk to your health care provider about which screenings and vaccines you need and how often you need them. This information is not intended to replace advice given to you by your health care provider. Make sure you discuss any questions you have with your health care provider. Document Released: 05/23/2015 Document Revised: 01/14/2016 Document Reviewed: 02/25/2015 Elsevier Interactive Patient Education  2017 [employer] Prevention in the Home Falls can cause injuries. They can happen to people of all ages. There are many things you can do to make your home safe and to help prevent falls. What can I do on the outside of my home?  Regularly fix the edges of walkways and driveways and fix any cracks.  Remove anything that might make you trip as you walk through a door, such as a raised step or threshold.  Trim any bushes or trees on the path to your home.  Use bright outdoor lighting.  Clear any walking paths of anything that might make someone trip, such as rocks or tools.  Regularly check to see if handrails are loose or broken. Make sure that both sides of any steps have handrails.  Any raised decks and porches should have guardrails on the edges.  Have any leaves, snow, or ice cleared regularly.  Use sand or salt on walking paths during winter.  Clean up any spills in your garage right away. This includes oil or grease spills. What can I do in the bathroom?  Use night lights.  Install grab bars by the toilet and in the tub and shower. Do not use towel bars as grab bars.  Use non-skid mats or decals in the tub or shower.  If you need to sit down in the shower, use a plastic, non-slip stool.  Keep the floor dry. Clean up any water that spills on the floor as soon as it happens.  Remove soap buildup in the tub or shower regularly.  Attach bath  mats securely with double-sided non-slip rug tape.  Do not have throw rugs and other things on the floor that can make you trip. What can I do in the bedroom?  Use night lights.  Make sure that you have a light by your bed that is easy to reach.  Do not use any sheets or blankets that are too big for your bed. They should not hang down onto the floor.  Have a firm chair that has side arms. You can use this for support while you get dressed.  Do not have throw rugs and other things on the floor that can make you trip. What can I do in the kitchen?  Clean up any spills right away.  Avoid walking on wet floors.  Keep items that you use a lot in easy-to-reach places.  If you need to reach something above you, use a strong step stool that has a grab bar.  Keep electrical cords out of the way.  Do not use floor polish or wax that makes floors slippery. If you must use wax, use non-skid floor wax.  Do not have throw rugs and other things on the floor that  can make you trip. What can I do with my stairs?  Do not leave any items on the stairs.  Make sure that there are handrails on both sides of the stairs and use them. Fix handrails that are broken or loose. Make sure that handrails are as long as the stairways.  Check any carpeting to make sure that it is firmly attached to the stairs. Fix any carpet that is loose or worn.  Avoid having throw rugs at the top or bottom of the stairs. If you do have throw rugs, attach them to the floor with carpet tape.  Make sure that you have a light switch at the top of the stairs and the bottom of the stairs. If you do not have them, ask someone to add them for you. What else can I do to help prevent falls?  Wear shoes that:  Do not have high heels.  Have rubber bottoms.  Are comfortable and fit you well.  Are closed at the toe. Do not wear sandals.  If you use a stepladder:  Make sure that it is fully opened. Do not climb a closed  stepladder.  Make sure that both sides of the stepladder are locked into place.  Ask someone to hold it for you, if possible.  Clearly mark and make sure that you can see:  Any grab bars or handrails.  First and last steps.  Where the edge of each step is.  Use tools that help you move around (mobility aids) if they are needed. These include:  Canes.  Walkers.  Scooters.  Crutches.  Turn on the lights when you go into a dark area. Replace any light bulbs as soon as they burn out.  Set up your furniture so you have a clear path. Avoid moving your furniture around.  If any of your floors are uneven, fix them.  If there are any pets around you, be aware of where they are.  Review your medicines with your doctor. Some medicines can make you feel dizzy. This can increase your chance of falling. Ask your doctor what other things that you can do to help prevent falls. This information is not intended to replace advice given to you by your health care provider. Make sure you discuss any questions you have with your health care provider. Document Released: 02/20/2009 Document Revised: 10/02/2015 Document Reviewed: 05/31/2014 Elsevier Interactive Patient Education  2017 [employer].

## 2018-09-20 NOTE — Progress Notes (Signed)
Subjective:   Kathleen [name] is a 68 y.o. female who presents for Medicare Annual (Subsequent) preventive examination.  Location of Patient: Home Location of Provider: Telehealth Consent was obtain for visit to be over via telehealth. I verified that I am speaking with the correct person using two identifiers.  Review of Systems:    Cardiac Risk Factors include: advanced age (>39mn, >>67women);diabetes mellitus;dyslipidemia;hypertension;obesity (BMI >30kg/m2)     Objective:     Vitals: BP 122/82   Ht _0  (1.676 m)   Wt 223 lb (101.2 kg)   BMI 35.99 kg/m   Body mass index is 35.99 kg/m.  Advanced Directives 04/27/2017 01/20/2017 04/20/2016  Does Patient Have a Medical Advance Directive? No No No  Would patient like information on creating a medical advance directive? No - Patient declined - Yes (ED - Information included in AVS)    Tobacco Social History   Tobacco Use  Smoking Status Never Smoker  Smokeless Tobacco Never Used     Counseling given: Yes   Clinical Intake:  Pre-visit preparation completed: Yes  Pain : No/denies pain     BMI - recorded: 35.99 Nutritional Status: BMI > 30  Obese Nutritional Risks: None Diabetes: Yes CBG done?: No Did pt. bring in CBG monitor from home?: No  How often do you need to have someone help you when you read instructions, pamphlets, or other written materials from your doctor or pharmacy?: 1 - Never What is the last grade level you completed in school?: 11  Interpreter Needed?: No     Past Medical History:  Diagnosis Date  . Anemia   . Arthritis   . Chronic kidney disease   . Diabetes mellitus type II   . Dysphagia    unspecified   . GERD (gastroesophageal reflux disease)   . Hyperlipidemia 2000  . Hypertension 1995  . Insomnia   . Low back pain   . Obesity    Past Surgical History:  Procedure Laterality Date  . Carpal tunnel release     left   . CHOLECYSTECTOMY  2007  . DIGIT NAIL REMOVAL   08/2011  . KNEE SURGERY  1.20.2012   arthroscopy LEFT knee partial medial meniscectomy  . TENOTOMY     2,3,4  left foot   . toenail removal    . TUBAL LIGATION     Family History  Problem Relation Age of Onset  . Diabetes Mother   . Hypertension Mother        MI  . Heart disease Mother   . Kidney disease Mother 679      dialysis  . Diabetes Sister   . Diabetes Brother   . Diabetes Brother   . Kidney disease Brother   . Dementia Sister   . Diabetes Father    Social History   Socioeconomic History  . Marital status: Divorced    Spouse name: Not on file  . Number of children: 4  . Years of education: Not on file  . Highest education level: 11th grade  Occupational History  . Occupation: Full time mEngineer, petroleum  Social Needs  . Financial resource strain: Not hard at all  . Food insecurity:    Worry: Never true    Inability: Never true  . Transportation needs:    Medical: No    Non-medical: No  Tobacco Use  . Smoking status: Never Smoker  . Smokeless tobacco: Never Used  Substance and Sexual Activity  . Alcohol  use: No  . Drug use: No  . Sexual activity: Yes  Lifestyle  . Physical activity:    Days per week: 5 days    Minutes per session: 30 min  . Stress: Not at all  Relationships  . Social connections:    Talks on phone: More than three times a week    Gets together: More than three times a week    Attends religious service: More than 4 times per year    Active member of club or organization: No    Attends meetings of clubs or organizations: Never    Relationship status: Divorced  Other Topics Concern  . Not on file  Social History Narrative   Lives with 2nd oldest son    Outpatient Encounter Medications as of 09/20/2018  Medication Sig  . ACCU-CHEK SOFTCLIX LANCETS lancets USE TO TEST 3 TIMES DAILY.  Marland Kitchen acetaminophen (TYLENOL ARTHRITIS PAIN) 650 MG CR tablet Take 650 mg by mouth 3 (three) times daily as needed. 1-2 by mouth three times a day as needed  pain   . amLODipine (NORVASC) 10 MG tablet TAKE ONE TABLET BY MOUTH ONCE DAILY FOR BLOOD PRESSURE.  Marland Kitchen aspirin (ASPIRIN LOW DOSE) 81 MG tablet Take 81 mg by mouth at bedtime.   . B-D ULTRAFINE III SHORT PEN 31G X 8 MM MISC USE DAILY WITH LANTUS.  Marland Kitchen blood glucose meter kit and supplies Test three times daily dx e11.65 accuchek aviva plus if covered  . cholecalciferol (VITAMIN D) 1000 UNITS tablet Take 1,000 Units by mouth daily.  . cloNIDine (CATAPRES) 0.3 MG tablet TAKE (1) TABLET BY MOUTH 3 TIMES DAILY.  . fluticasone (FLONASE) 50 MCG/ACT nasal spray Place 2 sprays into both nostrils daily as needed.  Marland Kitchen glipiZIDE (GLUCOTROL XL) 10 MG 24 hr tablet Take two tablets every morning with breakfast  . glucose blood (ACCU-CHEK AVIVA PLUS) test strip USE TO TEST 3 TIMES DAILY.  Marland Kitchen Insulin Glargine (LANTUS SOLOSTAR) 100 UNIT/ML Solostar Pen INJECT 100 UNITS INTO THE SKIN AT 10 PM.  . pravastatin (PRAVACHOL) 20 MG tablet Take 1 tablet (20 mg total) by mouth daily.  . ramipril (ALTACE) 10 MG capsule TAKE (1) CAPSULE BY MOUTH ONCE DAILY.  Marland Kitchen spironolactone-hydrochlorothiazide (ALDACTAZIDE) 25-25 MG tablet Take 1 tablet by mouth daily.  . [DISCONTINUED] sitaGLIPtan (JANUVIA) 100 MG tablet Take 100 mg by mouth daily.     No facility-administered encounter medications on file as of 09/20/2018.     Activities of Daily Living In your present state of health, do you have any difficulty performing the following activities: 09/20/2018  Hearing? N  Vision? N  Difficulty concentrating or making decisions? N  Walking or climbing stairs? N  Dressing or bathing? N  Doing errands, shopping? N  Preparing Food and eating ? N  Using the Toilet? N  In the past six months, have you accidently leaked urine? N  Do you have problems with loss of bowel control? N  Managing your Medications? N  Managing your Finances? N  Housekeeping or managing your Housekeeping? N  Some recent data might be hidden    Patient Care  Team: Fayrene Helper, MD as PCP - General    Assessment:   This is a routine wellness examination for Clemencia.  Exercise Activities and Dietary recommendations Current Exercise Habits: Home exercise routine, Type of exercise: strength training/weights, Time (Minutes): 30, Frequency (Times/Week): 7, Weekly Exercise (Minutes/Week): 210, Intensity: Mild  Goals    . HEMOGLOBIN A1C < 7.0    .  Reduce carbohydrate intake     Starting 04/20/2016 try to decrease amount of carbohydrates in your diet.       Fall Risk Fall Risk  09/20/2018 06/22/2018 04/03/2018 02/28/2018 12/27/2017  Falls in the past year? 0 0 0 No No  Number falls in past yr: - 0 - - -  Injury with Fall? 0 0 - - -  Risk for fall due to : - - - - -   Is the patient's home free of loose throw rugs in walkways, pet beds, electrical cords, etc?   yes      Grab bars in the bathroom? no      Handrails on the stairs?   no stairs      Adequate lighting?   yes  Timed Get Up and Go performed:  Telemedicine-unable to perform  Depression Screen PHQ 2/9 Scores 09/20/2018 06/22/2018 04/03/2018 02/28/2018  PHQ - 2 Score 0 0 0 0  PHQ- 9 Score - - - -     Cognitive Function     6CIT Screen 09/20/2018 04/27/2017 04/20/2016  What Year? 0 points 0 points 0 points  What month? 0 points 0 points 0 points  What time? 0 points 0 points 0 points  Count back from 20 0 points 0 points 0 points  Months in reverse 0 points 0 points 0 points  Repeat phrase 0 points 0 points 0 points  Total Score 0 0 0    Immunization History  Administered Date(s) Administered  . Influenza Split 02/23/2011, 03/14/2012  . Influenza Whole 03/24/2007, 02/21/2008, 02/26/2010  . Influenza,inj,Quad PF,6+ Mos 04/23/2014, 04/15/2015, 12/29/2015, 02/14/2017, 12/27/2017  . Pneumococcal Conjugate-13 12/26/2013  . Pneumococcal Polysaccharide-23 11/29/2008, 08/12/2015  . Td 02/03/2009  . Zoster 10/28/2010    Qualifies for Shingles Vaccine? completed  Screening  Tests Health Maintenance  Topic Date Due  . INFLUENZA VACCINE  12/09/2018  . HEMOGLOBIN A1C  12/21/2018  . TETANUS/TDAP  02/04/2019  . OPHTHALMOLOGY EXAM  06/07/2019  . FOOT EXAM  06/23/2019  . MAMMOGRAM  11/15/2019  . COLONOSCOPY  12/02/2019  . DEXA SCAN  Completed  . Hepatitis C Screening  Completed  . PNA vac Low Risk Adult  Completed    Cancer Screenings: Lung: Low Dose CT Chest recommended if Age 60-80 years, 30 pack-year currently smoking OR have quit w/in 15years. Patient does not qualify. Breast:  Up to date on Mammogram? Yes   Up to date of Bone Density/Dexa? Yes Colorectal:  Due  2021  Additional Screenings:   Hepatitis C Screening: completed     Plan:      1. Encounter for Medicare annual wellness exam  I have personally reviewed and noted the following in the patient's chart:   . Medical and social history . Use of alcohol, tobacco or illicit drugs  . Current medications and supplements . Functional ability and status . Nutritional status . Physical activity . Advanced directives . List of other physicians . Hospitalizations, surgeries, and ER visits in previous 12 months . Vitals . Screenings to include cognitive, depression, and falls . Referrals and appointments  In addition, I have reviewed and discussed with patient certain preventive protocols, quality metrics, and best practice recommendations. A written personalized care plan for preventive services as well as general preventive health recommendations were provided to patient.   I provided 20 minutes of non-face-to-face time during this encounter  Perlie Mayo, NP  09/20/2018

## 2018-09-25 ENCOUNTER — Encounter: Payer: Self-pay | Admitting: Family Medicine

## 2018-09-25 ENCOUNTER — Other Ambulatory Visit: Payer: Self-pay

## 2018-09-25 ENCOUNTER — Ambulatory Visit (INDEPENDENT_AMBULATORY_CARE_PROVIDER_SITE_OTHER): Payer: Medicare Other | Admitting: Family Medicine

## 2018-09-25 VITALS — BP 138/60 | HR 88 | Resp 12 | Ht 66.0 in | Wt 234.1 lb

## 2018-09-25 DIAGNOSIS — Z1231 Encounter for screening mammogram for malignant neoplasm of breast: Secondary | ICD-10-CM | POA: Diagnosis not present

## 2018-09-25 DIAGNOSIS — E1165 Type 2 diabetes mellitus with hyperglycemia: Secondary | ICD-10-CM | POA: Diagnosis not present

## 2018-09-25 DIAGNOSIS — M1712 Unilateral primary osteoarthritis, left knee: Secondary | ICD-10-CM

## 2018-09-25 DIAGNOSIS — E785 Hyperlipidemia, unspecified: Secondary | ICD-10-CM

## 2018-09-25 DIAGNOSIS — IMO0001 Reserved for inherently not codable concepts without codable children: Secondary | ICD-10-CM

## 2018-09-25 DIAGNOSIS — I10 Essential (primary) hypertension: Secondary | ICD-10-CM

## 2018-09-25 DIAGNOSIS — Z794 Long term (current) use of insulin: Secondary | ICD-10-CM

## 2018-09-25 MED ORDER — RAMIPRIL 10 MG PO CAPS: 10.0000 mg | ORAL_CAPSULE | Freq: Every day | ORAL | 3 refills | Status: DC

## 2018-09-25 MED ORDER — CLONIDINE HCL 0.3 MG PO TABS: 0.3000 mg | ORAL_TABLET | Freq: Three times a day (TID) | ORAL | 3 refills | Status: DC

## 2018-09-25 MED ORDER — SPIRONOLACTONE-HCTZ 25-25 MG PO TABS: 1.0000 | ORAL_TABLET | Freq: Every day | ORAL | 3 refills | Status: DC

## 2018-09-25 NOTE — Patient Instructions (Addendum)
Annual physical exam Oct 23 or shortly aftter callif you neeed me sooner  Mammogram is being scheduled for July 20 or after  Lab today HBA1C ,  Chem 7 and EGFR    It is important that you exercise regularly at least 30 minutes 5 times a week. If you develop chest pain, have severe difficulty breathing, or feel very tired, stop exercising immediately and seek medical attention   Social distancing.6 ft disrtance Frequent hand washing with soap and water Keeping your hands off of your face.Wear a mask These 3 practices will help to keep both you and your community healthy during this time. Please practice them faithfully!   Think about what you will eat, plan ahead. Choose " clean, green, fresh or frozen" over canned, processed or packaged foods which are more sugary, salty and fatty. 70 to 75% of food eaten should be vegetables and fruit. Three meals at set times with snacks allowed between meals, but they must be fruit or vegetables. Aim to eat over a 12 hour period , example 7 am to 7 pm, and STOP after  your last meal of the day. Drink water,generally about 64 ounces per day, no other drink is as healthy. Fruit juice is best enjoyed in a healthy way, by EATING the fruit.   Thanks for choosing [facility], we consider it a privelige to serve you.

## 2018-09-25 NOTE — Assessment & Plan Note (Signed)
Hyperlipidemia:Low fat diet discussed and encouraged.   Lipid Panel  Lab Results  Component Value Date   CHOL 140 06/22/2018   HDL 39 (L) 06/22/2018   LDLCALC 80 06/22/2018   TRIG 117 06/22/2018   CHOLHDL 3.6 06/22/2018  Controlled, no change in medication Needs to increase exercise commitment , which she reports has improved

## 2018-09-25 NOTE — Assessment & Plan Note (Signed)
No current flare of pain

## 2018-09-25 NOTE — Assessment & Plan Note (Signed)
Deteriorated. Obesity linked with hypertension and diabetes  Patient re-educated about  the importance of commitment to a  minimum of 150 minutes of exercise per week as able.  The importance of healthy food choices with portion control discussed, as well as eating regularly and within a 12 hour window most days. The need to choose "clean , green" food 50 to 75% of the time is discussed, as well as to make water the primary drink and set a goal of 64 ounces water daily.  Encouraged to start a food diary,  and to consider  joining a support group. Sample diet sheets offered. Goals set by the patient for the next several months.   Weight /BMI 09/25/2018 09/20/2018 06/22/2018  WEIGHT 234 lb 1.3 oz 223 lb 234 lb  HEIGHT 5\' 6"  5\' 6"  5\' 6"   BMI 37.78 kg/m2 35.99 kg/m2 37.77 kg/m2

## 2018-09-25 NOTE — Assessment & Plan Note (Signed)
Uncontrolled Updated lab needed at/ before next visit. Ms. Tancredi is reminded of the importance of commitment to daily physical activity for 30 minutes or more, as able and the need to limit carbohydrate intake to 30 to 60 grams per meal to help with blood sugar control.   The need to take medication as prescribed, test blood sugar as directed, and to call between visits if there is a concern that blood sugar is uncontrolled is also discussed.   Ms. Doutt is reminded of the importance of daily foot exam, annual eye examination, and good blood sugar, blood pressure and cholesterol control.  Diabetic Labs Latest Ref Rng & Units 06/22/2018 04/03/2018 02/22/2018 10/27/2017 04/27/2017  HbA1c <5.7 % of total Hgb 9.8(H) - 9.1(H) 9.9(H) 7.8(H)  Microalbumin mg/dL - - - 0.4 -  Micro/Creat Ratio 0.0 - 30.0 mg/g creat - - - - -  Chol <200 mg/dL 140 - - 143 132  HDL > OR = 50 mg/dL 39(L) - - 36(L) 47(L)  Calc LDL mg/dL (calc) 80 - - 83 69  Triglycerides <150 mg/dL 117 - - 139 84  Creatinine 0.50 - 0.99 mg/dL 1.54(H) 1.61(H) 2.21(H) 1.72(H) 1.63(H)   BP/Weight 09/25/2018 09/20/2018 06/22/2018 04/03/2018 02/28/2018 12/27/2017 01/27/1006  Systolic BP 121 975 883 254 982 641 583  Diastolic BP 60 82 82 80 60 72 62  Wt. (Lbs) 234.08 223 234 236 234.12 235 235  BMI 37.78 35.99 37.77 38.09 37.79 39.11 39.11   Foot/eye exam completion dates Latest Ref Rng & Units 06/22/2018 06/06/2018  Eye Exam No Retinopathy - No Retinopathy  Foot Form Completion - Done -

## 2018-09-25 NOTE — Progress Notes (Signed)
Kathleen [name]     MRN: [id number]      DOB: 1950-12-09   HPI Kathleen [name] is here for follow up and re-evaluation of chronic medical conditions, medication management and review of any available recent lab and radiology data.  Preventive health is updated, specifically  Cancer screening and Immunization.   Questions or concerns regarding consultations or procedures which the PT has had in the interim are  addressed. The PT denies any adverse reactions to current medications since the last visit.  There are no new concerns.  There are no specific complaints  Denies polyuria, polydipsia, blurred vision , or hypoglycemic episodes. Reports excellent blood sugar readings with FBG ranging between 90 to 130 ROS Denies recent fever or chills. Denies sinus pressure, nasal congestion, ear pain or sore throat. Denies chest congestion, productive cough or wheezing. Denies chest pains, palpitations and leg swelling Denies abdominal pain, nausea, vomiting,diarrhea or constipation.   Denies dysuria, frequency, hesitancy or incontinence. Denies joint pain, swelling and limitation in mobility. Denies headaches, seizures, numbness, or tingling. Denies depression, anxiety or insomnia. Denies skin break down or rash.   PE  BP 138/60   Pulse 88   Resp 12   Ht 5\' 6"  (1.676 m)   Wt 234 lb 1.3 oz (106.2 kg)   SpO2 97%   BMI 37.78 kg/m   Patient alert and oriented and in no cardiopulmonary distress.  HEENT: No facial asymmetry, EOMI,   oropharynx pink and moist.  Neck supple no JVD, no mass.  Chest: Clear to auscultation bilaterally.  CVS: S1, S2 no murmurs, no S3.Regular rate.  ABD: Soft non tender.   Ext: No edema  MS: Adequate ROM spine, shoulders, hips and knees.  Skin: Intact, no ulcerations or rash noted.  Psych: Good eye contact, normal affect. Memory intact not anxious or depressed appearing.  CNS: CN 2-12 intact, power,  normal throughout.no focal deficits noted.    Assessment & Plan  Essential hypertension Controlled, no change in medication DASH diet and commitment to daily physical activity for a minimum of 30 minutes discussed and encouraged, as a part of hypertension management. The importance of attaining a healthy weight is also discussed.  BP/Weight 09/25/2018 09/20/2018 06/22/2018 04/03/2018 02/28/2018 12/27/2017 11/19/4578  Systolic BP 998 338 250 539 767 341 937  Diastolic BP 60 82 82 80 60 72 62  Wt. (Lbs) 234.08 223 234 236 234.12 235 235  BMI 37.78 35.99 37.77 38.09 37.79 39.11 39.11       Diabetes mellitus, insulin dependent (IDDM), uncontrolled ([place]) Uncontrolled Updated lab needed at/ before next visit. Kathleen [name] is reminded of the importance of commitment to daily physical activity for 30 minutes or more, as able and the need to limit carbohydrate intake to 30 to 60 grams per meal to help with blood sugar control.   The need to take medication as prescribed, test blood sugar as directed, and to call between visits if there is a concern that blood sugar is uncontrolled is also discussed.   Kathleen [name] is reminded of the importance of daily foot exam, annual eye examination, and good blood sugar, blood pressure and cholesterol control.  Diabetic Labs Latest Ref Rng & Units 06/22/2018 04/03/2018 02/22/2018 10/27/2017 04/27/2017  HbA1c <5.7 % of total Hgb 9.8(H) - 9.1(H) 9.9(H) 7.8(H)  Microalbumin mg/dL - - - 0.4 -  Micro/Creat Ratio 0.0 - 30.0 mg/g creat - - - - -  Chol <200 mg/dL 140 - - 143 132  HDL >  OR = 50 mg/dL 39(L) - - 36(L) 47(L)  Calc LDL mg/dL (calc) 80 - - 83 69  Triglycerides <150 mg/dL 117 - - 139 84  Creatinine 0.50 - 0.99 mg/dL 1.54(H) 1.61(H) 2.21(H) 1.72(H) 1.63(H)   BP/Weight 09/25/2018 09/20/2018 06/22/2018 04/03/2018 02/28/2018 12/27/2017 07/15/6576  Systolic BP 469 629 528 413 244 010 272  Diastolic BP 60 82 82 80 60 72 62  Wt. (Lbs) 234.08 223 234 236 234.12 235 235  BMI 37.78 35.99 37.77 38.09 37.79 39.11  39.11   Foot/eye exam completion dates Latest Ref Rng & Units 06/22/2018 06/06/2018  Eye Exam No Retinopathy - No Retinopathy  Foot Form Completion - Done -        Morbid obesity (HCC) Deteriorated. Obesity linked with hypertension and diabetes  Patient re-educated about  the importance of commitment to a  minimum of 150 minutes of exercise per week as able.  The importance of healthy food choices with portion control discussed, as well as eating regularly and within a 12 hour window most days. The need to choose "clean , green" food 50 to 75% of the time is discussed, as well as to make water the primary drink and set a goal of 64 ounces water daily.  Encouraged to start a food diary,  and to consider  joining a support group. Sample diet sheets offered. Goals set by the patient for the next several months.   Weight /BMI 09/25/2018 09/20/2018 06/22/2018  WEIGHT 234 lb 1.3 oz 223 lb 234 lb  HEIGHT 5\' 6"  5\' 6"  5\' 6"   BMI 37.78 kg/m2 35.99 kg/m2 37.77 kg/m2      Osteoarthritis of left knee No current flare of pain  Hyperlipidemia LDL goal <100 Hyperlipidemia:Low fat diet discussed and encouraged.   Lipid Panel  Lab Results  Component Value Date   CHOL 140 06/22/2018   HDL 39 (L) 06/22/2018   LDLCALC 80 06/22/2018   TRIG 117 06/22/2018   CHOLHDL 3.6 06/22/2018  Controlled, no change in medication Needs to increase exercise commitment , which she reports has improved

## 2018-09-25 NOTE — Assessment & Plan Note (Signed)
Controlled, no change in medication DASH diet and commitment to daily physical activity for a minimum of 30 minutes discussed and encouraged, as a part of hypertension management. The importance of attaining a healthy weight is also discussed.  BP/Weight 09/25/2018 09/20/2018 06/22/2018 04/03/2018 02/28/2018 12/27/2017 09/09/7739  Systolic BP 287 867 672 094 709 628 366  Diastolic BP 60 82 82 80 60 72 62  Wt. (Lbs) 234.08 223 234 236 234.12 235 235  BMI 37.78 35.99 37.77 38.09 37.79 39.11 39.11

## 2018-09-26 LAB — HEMOGLOBIN A1C
Hgb A1c MFr Bld: 9.3 % of total Hgb — ABNORMAL HIGH (ref <5.7)
Mean Plasma Glucose: 220 (calc)
eAG (mmol/L): 12.2 (calc)

## 2018-09-26 LAB — BASIC METABOLIC PANEL WITH GFR
BUN/Creatinine Ratio: 19 (calc) (ref 6–22)
BUN: 31 mg/dL — ABNORMAL HIGH (ref 7–25)
CO2: 24 mmol/L (ref 20–32)
Calcium: 10.3 mg/dL (ref 8.6–10.4)
Chloride: 99 mmol/L (ref 98–110)
Creat: 1.59 mg/dL — ABNORMAL HIGH (ref 0.50–0.99)
GFR, Est African American: 38 mL/min/{1.73_m2} — ABNORMAL LOW (ref >=60)
GFR, Est Non African American: 33 mL/min/{1.73_m2} — ABNORMAL LOW (ref >=60)
Glucose, Bld: 135 mg/dL (ref 65–139)
Potassium: 5.3 mmol/L (ref 3.5–5.3)
Sodium: 135 mmol/L (ref 135–146)

## 2018-09-28 ENCOUNTER — Other Ambulatory Visit: Payer: Self-pay

## 2018-09-28 DIAGNOSIS — IMO0001 Reserved for inherently not codable concepts without codable children: Secondary | ICD-10-CM

## 2018-09-28 MED ORDER — INSULIN GLARGINE 100 UNIT/ML SOLOSTAR PEN: PEN_INJECTOR | SUBCUTANEOUS | 0 refills | Status: DC

## 2018-10-16 ENCOUNTER — Ambulatory Visit: Payer: Self-pay | Admitting: "Endocrinology

## 2018-10-23 ENCOUNTER — Ambulatory Visit: Payer: Medicare Other | Admitting: Family Medicine

## 2018-10-27 ENCOUNTER — Ambulatory Visit (INDEPENDENT_AMBULATORY_CARE_PROVIDER_SITE_OTHER): Payer: Medicare Other | Admitting: Family Medicine

## 2018-10-27 ENCOUNTER — Other Ambulatory Visit: Payer: Self-pay

## 2018-10-27 ENCOUNTER — Encounter: Payer: Self-pay | Admitting: Family Medicine

## 2018-10-27 ENCOUNTER — Encounter (INDEPENDENT_AMBULATORY_CARE_PROVIDER_SITE_OTHER): Payer: Self-pay

## 2018-10-27 VITALS — BP 136/76 | HR 91 | Temp 98.7°F | Resp 12 | Ht 66.0 in | Wt 237.1 lb

## 2018-10-27 DIAGNOSIS — Z794 Long term (current) use of insulin: Secondary | ICD-10-CM

## 2018-10-27 DIAGNOSIS — E11649 Type 2 diabetes mellitus with hypoglycemia without coma: Secondary | ICD-10-CM

## 2018-10-27 DIAGNOSIS — E1165 Type 2 diabetes mellitus with hyperglycemia: Secondary | ICD-10-CM | POA: Diagnosis not present

## 2018-10-27 DIAGNOSIS — IMO0001 Reserved for inherently not codable concepts without codable children: Secondary | ICD-10-CM

## 2018-10-27 NOTE — Patient Instructions (Addendum)
    Thank you for coming into the office today.  I appreciate the opportunity to provide you with the care for your health and wellness. Today we discussed: diabetes  Follow up: 3 weeks: bring meter with you  STOP GLIPIZIDE XL 10 mg  Continue Lantus 60 units twice daily  Call if you see numbers below 70 or above 200 for several days in a row.  Drink water daily 6-8 cups  Continue to eat a well balanced diet: veggies and fresh fruits (75% of meal), and some meats.  AVOID extra sugar.  Please continue to practice social distancing.   [place] YOUR HANDS WELL AND FREQUENTLY. AVOID TOUCHING YOUR FACE, UNLESS YOUR HANDS ARE FRESHLY WASHED.  GET FRESH AIR DAILY. STAY HYDRATED WITH WATER.   It was a pleasure to see you and I look forward to continuing to work together on your health and well-being. Please do not hesitate to call the office if you need care or have questions about your care.  Have a wonderful day and week.  With Gratitude,  Cherly Beach, DNP, AGNP-BC

## 2018-10-27 NOTE — Progress Notes (Signed)
Subjective:     Patient ID: Kathleen [name], female   DOB: Nov 22, 1950, 68 y.o.   MRN: [id number]  Kathleen [name] presents for Diabetes (follow up)  Blood sugars ranges:  Am: 56-140 Lunch: 101-231 PM: 73-150  Does not eat breakfast much, or will eat late in mid morning. She does eat lunch regularly-daily. She will sometimes not eat dinner at the same time: ranging from 5pm-7pm at night. Her eating pattern is not consistent.  When you blood sugar: feels shaky. Denies vision changes, HA's, dizziness. Denies fainting or passing out as well.  Diabetes follow-up:  Blood sugars at home are running.  Reports hypoglycemia.  Denies polydipsia and polyuria.  Last eye exam was May/June of 2020.  Last foot exam in office was May 2020 (per her).  Last A1c 9.3% may 2020. Denies non-healing wounds or rashes. Denies signs of UTI or other infections. Patient follows a low sugar diet and checks feet regularly without concerns. Reports staying hydrated by drinking water.   Past Medical, Surgical, Social History, Allergies, and Medications have been Reviewed.   Past Medical History:  Diagnosis Date  . Anemia   . Arthritis   . Chronic kidney disease   . Diabetes mellitus type II   . Dysphagia    unspecified   . GERD (gastroesophageal reflux disease)   . Hyperlipidemia 2000  . Hypertension 1995  . Insomnia   . Low back pain   . Obesity    Past Surgical History:  Procedure Laterality Date  . Carpal tunnel release     left   . CHOLECYSTECTOMY  2007  . DIGIT NAIL REMOVAL  08/2011  . KNEE SURGERY  1.20.2012   arthroscopy LEFT knee partial medial meniscectomy  . TENOTOMY     2,3,4  left foot   . toenail removal    . TUBAL LIGATION     Social History   Socioeconomic History  . Marital status: Divorced    Spouse name: Not on file  . Number of children: 4  . Years of education: Not on file  . Highest education level: 11th grade  Occupational History  . Occupation: Full time Librarian, academic   Social Needs  . Financial resource strain: Not hard at all  . Food insecurity    Worry: Never true    Inability: Never true  . Transportation needs    Medical: No    Non-medical: No  Tobacco Use  . Smoking status: Never Smoker  . Smokeless tobacco: Never Used  Substance and Sexual Activity  . Alcohol use: No  . Drug use: No  . Sexual activity: Yes  Lifestyle  . Physical activity    Days per week: 5 days    Minutes per session: 30 min  . Stress: Not at all  Relationships  . Social connections    Talks on phone: More than three times a week    Gets together: More than three times a week    Attends religious service: More than 4 times per year    Active member of club or organization: No    Attends meetings of clubs or organizations: Never    Relationship status: Divorced  . Intimate partner violence    Fear of current or ex partner: No    Emotionally abused: No    Physically abused: No    Forced sexual activity: No  Other Topics Concern  . Not on file  Social History Narrative   Lives with  2nd oldest son    Outpatient Encounter Medications as of 10/27/2018  Medication Sig  . ACCU-CHEK SOFTCLIX LANCETS lancets USE TO TEST 3 TIMES DAILY.  Marland Kitchen acetaminophen (TYLENOL ARTHRITIS PAIN) 650 MG CR tablet Take 650 mg by mouth 3 (three) times daily as needed. 1-2 by mouth three times a day as needed pain   . amLODipine (NORVASC) 10 MG tablet TAKE ONE TABLET BY MOUTH ONCE DAILY FOR BLOOD PRESSURE.  Marland Kitchen aspirin (ASPIRIN LOW DOSE) 81 MG tablet Take 81 mg by mouth at bedtime.   . B-D ULTRAFINE III SHORT PEN 31G X 8 MM MISC USE DAILY WITH LANTUS.  Marland Kitchen blood glucose meter kit and supplies Test three times daily dx e11.65 accuchek aviva plus if covered  . cholecalciferol (VITAMIN D) 1000 UNITS tablet Take 1,000 Units by mouth daily.  . cloNIDine (CATAPRES) 0.3 MG tablet Take 1 tablet (0.3 mg total) by mouth 3 (three) times daily.  . fluticasone (FLONASE) 50 MCG/ACT nasal spray Place 2  sprays into both nostrils daily as needed.  Marland Kitchen glipiZIDE (GLUCOTROL XL) 10 MG 24 hr tablet Take two tablets every morning with breakfast  . glucose blood (ACCU-CHEK AVIVA PLUS) test strip USE TO TEST 3 TIMES DAILY.  Marland Kitchen Insulin Glargine (LANTUS SOLOSTAR) 100 UNIT/ML Solostar Pen INJECT 60 UNITS INTO THE SKIN TWICE DAILY  . pravastatin (PRAVACHOL) 20 MG tablet Take 1 tablet (20 mg total) by mouth daily.  . ramipril (ALTACE) 10 MG capsule Take 1 capsule (10 mg total) by mouth daily.  Marland Kitchen spironolactone-hydrochlorothiazide (ALDACTAZIDE) 25-25 MG tablet Take 1 tablet by mouth daily.  . [DISCONTINUED] sitaGLIPtan (JANUVIA) 100 MG tablet Take 100 mg by mouth daily.     No facility-administered encounter medications on file as of 10/27/2018.    No Known Allergies  Review of Systems  Constitutional: Negative.   HENT: Negative.   Eyes: Negative.   Respiratory: Negative.   Cardiovascular: Negative.   Gastrointestinal: Negative.   Endocrine: Negative.   Genitourinary: Negative.   Musculoskeletal: Negative.   Skin: Negative.   Allergic/Immunologic: Negative.   Neurological: Negative.   Hematological: Negative.   Psychiatric/Behavioral: Negative.   All other systems reviewed and are negative.      Objective:     BP 136/76   Pulse 91   Temp 98.7 F (37.1 C) (Oral)   Resp 12   Ht 5' 6"  (1.676 m)   Wt 237 lb 1.9 oz (107.6 kg)   SpO2 96%   BMI 38.27 kg/m   Physical Exam Vitals signs and nursing note reviewed.  Constitutional:      Appearance: Normal appearance. She is obese.  HENT:     Head: Normocephalic and atraumatic.     Right Ear: External ear normal.     Left Ear: External ear normal.     Nose: Nose normal.  Eyes:     General:        Right eye: No discharge.        Left eye: No discharge.  Neck:     Musculoskeletal: Normal range of motion and neck supple.  Cardiovascular:     Rate and Rhythm: Normal rate and regular rhythm.     Pulses: Normal pulses.     Heart sounds:  Normal heart sounds.  Pulmonary:     Effort: Pulmonary effort is normal.     Breath sounds: Normal breath sounds.  Musculoskeletal: Normal range of motion.  Skin:    General: Skin is warm and dry.  Neurological:  Mental Status: She is alert and oriented to person, place, and time.  Psychiatric:        Mood and Affect: Mood normal.        Behavior: Behavior normal.        Thought Content: Thought content normal.        Judgment: Judgment normal.        Assessment and Plan        1. Diabetes mellitus, insulin dependent (IDDM), uncontrolled (HCC) Uncontrolled, A1c in May was 9.3% She reports not eating on a regular pattern. Taking Glipizide XL 10 mg daily and 60 of Lantus twice daily.  I will be stopping the glipizide as of today. Continuing the Lantus. Encouraged to eat a more regular diet and avoid heavy carbs and excess sugars.  Advised to continue to check her blood sugar 3 times daily, more if feeling bad or shaky. Follow up in 3 weeks with meter.  Will consider reduction in lantus if numbers are still low and a referral to [facility]/Endocrin.  2. Hypoglycemia associated with type 2 diabetes mellitus ([place]) Continues to have low blood sugars in range of 50-70's. See above for plan   Follow Up: 11/17/2018   Perlie Mayo, DNP, AGNP-BC [place], [address], [place] [address] Office Hours: Mon-Thurs 8 am-5 pm; Fri 8 am-12 pm Office Phone:  [phone]  Office Fax: [phone]

## 2018-11-01 ENCOUNTER — Other Ambulatory Visit: Payer: Self-pay | Admitting: Family Medicine

## 2018-11-01 DIAGNOSIS — IMO0001 Reserved for inherently not codable concepts without codable children: Secondary | ICD-10-CM

## 2018-11-15 ENCOUNTER — Other Ambulatory Visit: Payer: Self-pay

## 2018-11-15 ENCOUNTER — Encounter: Payer: Self-pay | Admitting: Family Medicine

## 2018-11-15 ENCOUNTER — Ambulatory Visit (INDEPENDENT_AMBULATORY_CARE_PROVIDER_SITE_OTHER): Payer: Medicare Other | Admitting: Family Medicine

## 2018-11-15 VITALS — BP 126/56 | HR 82 | Temp 97.9°F | Resp 12 | Ht 66.0 in | Wt 233.1 lb

## 2018-11-15 DIAGNOSIS — E11649 Type 2 diabetes mellitus with hypoglycemia without coma: Secondary | ICD-10-CM | POA: Diagnosis not present

## 2018-11-15 DIAGNOSIS — Z794 Long term (current) use of insulin: Secondary | ICD-10-CM | POA: Diagnosis not present

## 2018-11-15 DIAGNOSIS — E1165 Type 2 diabetes mellitus with hyperglycemia: Secondary | ICD-10-CM

## 2018-11-15 DIAGNOSIS — IMO0001 Reserved for inherently not codable concepts without codable children: Secondary | ICD-10-CM

## 2018-11-15 NOTE — Progress Notes (Signed)
Subjective:    Kathleen [name] is an 68 y.o. female who presents for follow up of diabetes.    Patient denies increased appetite, nausea, paresthesia of the feet, polydipsia, polyuria, visual disturbances, vomiting and weight loss. Evaluation to date has included: hemoglobin A1C. Home sugars: BGs have been labile ranging between 65 and 200. (only had 4 episodes of low levels over 3 weeks compared to multiple a week prior to stopping glipizide).    Denies polydipsia and polyuria.  Last eye exam was may 2020 .  Last foot exam in office was (she reports annual visit)    Last A1c 9.3. Denies non-healing wounds or rashes. Denies signs of UTI or other infections. Patient follows a low sugar diet and checks feet regularly without concerns. Reports staying hydrated by drinking water.   Today patient denies signs and symptoms of COVID 19 infection including fever, chills, cough, shortness of breath, and headache.   Past Medical, Surgical, Social History, Allergies, and Medications have been Reviewed.   Review of Systems  Constitutional: Negative.   HENT: Negative.   Eyes: Negative.   Respiratory: Negative.   Cardiovascular: Negative.   Gastrointestinal: Negative.   Endocrine: Negative.   Genitourinary: Negative.   Musculoskeletal: Negative.   Skin: Negative.   Allergic/Immunologic: Negative.   Neurological: Negative.   Hematological: Negative.   Psychiatric/Behavioral: Negative.   All other systems reviewed and are negative.  Lab Review No components found for: A1C      Lab Results  Component Value Date   CHOL 140 06/22/2018   CHOL 143 10/27/2017   CHOL 132 04/27/2017   HDL 39 (L) 06/22/2018   HDL 36 (L) 10/27/2017   HDL 47 (L) 04/27/2017       Physical Exam  Physical Exam Vitals signs and nursing note reviewed.  Constitutional:      Appearance: Normal appearance. She is obese.  HENT:     Head: Normocephalic and atraumatic.     Right Ear: External ear normal.     Left Ear:  External ear normal.     Nose: Nose normal.  Eyes:     General:        Right eye: No discharge.        Left eye: No discharge.  Neck:     Musculoskeletal: Normal range of motion and neck supple.  Cardiovascular:     Rate and Rhythm: Normal rate and regular rhythm.     Pulses: Normal pulses.     Heart sounds: Normal heart sounds.  Pulmonary:     Effort: Pulmonary effort is normal.     Breath sounds: Normal breath sounds.  Musculoskeletal: Normal range of motion.  Skin:    General: Skin is warm and dry.  Neurological:     Mental Status: She is alert and oriented to person, place, and time.  Psychiatric:        Mood and Affect: Mood normal.        Behavior: Behavior normal.        Thought Content: Thought content normal.        Judgment: Judgment normal.      Plan:   1. Diabetes mellitus, insulin dependent (IDDM), uncontrolled ([place]) Starting to have better control over BS levels. On statin. Reports taking medications as directed and without issue. Will repeat labs prior to next appt.  - Hemoglobin A1c - COMPLETE METABOLIC PANEL WITH GFR  2. Hypoglycemia associated with type 2 diabetes mellitus ([place]) Slowly improved. Continue current medication.  -  Hemoglobin A1c - COMPLETE METABOLIC PANEL WITH GFR   Follow Up: 3 months   Perlie Mayo, DNP, AGNP-BC [place], [place], [place] [address] Office Hours: Mon-Thurs 8 am-5 pm; Fri 8 am-12 pm Office Phone:  [phone]  Office Fax: [phone]

## 2018-11-15 NOTE — Patient Instructions (Addendum)
    Thank you for coming into the office today. I appreciate the opportunity to provide you with the care for your health and wellness. Today we discussed: diabetes  Follow Up: 1 week pf Sept (bring meter with you)  Labs 1 week before next appt.  Great job on diet changes, you have lost 4 pounds in the last 3 weeks.  Continue this! Drink water daily 6-8 cups  Continue to eat a well balanced diet: veggies and fresh fruits (75% of meal), and some meats.  AVOID extra sugar.  Please continue to practice social distancing to keep you, your family, and our community safe.  If you must go out, please wear a Mask and practice good handwashing.  [place] YOUR HANDS WELL AND FREQUENTLY. AVOID TOUCHING YOUR FACE, UNLESS YOUR HANDS ARE FRESHLY WASHED.  GET FRESH AIR DAILY. STAY HYDRATED WITH WATER.   It was a pleasure to see you and I look forward to continuing to work together on your health and well-being. Please do not hesitate to call the office if you need care or have questions about your care.  Have a wonderful day and week.  With Gratitude,  Cherly Beach, DNP, AGNP-BC

## 2018-11-16 ENCOUNTER — Ambulatory Visit: Payer: Medicare Other | Admitting: Family Medicine

## 2018-11-17 ENCOUNTER — Ambulatory Visit (HOSPITAL_COMMUNITY): Payer: Medicare Other

## 2018-11-17 ENCOUNTER — Ambulatory Visit: Payer: Medicare Other | Admitting: Family Medicine

## 2018-11-27 ENCOUNTER — Ambulatory Visit (HOSPITAL_COMMUNITY): Payer: Medicare Other

## 2018-12-07 ENCOUNTER — Telehealth: Payer: Self-pay | Admitting: Family Medicine

## 2018-12-07 ENCOUNTER — Other Ambulatory Visit: Payer: Self-pay

## 2018-12-07 DIAGNOSIS — IMO0001 Reserved for inherently not codable concepts without codable children: Secondary | ICD-10-CM

## 2018-12-07 MED ORDER — BD PEN NEEDLE SHORT U/F 31G X 8 MM MISC: 3 refills | Status: DC

## 2018-12-07 MED ORDER — LANTUS SOLOSTAR 100 UNIT/ML ~~LOC~~ SOPN: PEN_INJECTOR | SUBCUTANEOUS | 3 refills | Status: DC

## 2018-12-07 NOTE — Telephone Encounter (Signed)
meds sent in

## 2018-12-07 NOTE — Telephone Encounter (Signed)
Pt needs refill on her Insulin Glargine (LANTUS SOLOSTAR) 100 UNIT/ML Solostar Pen and the pen needles. Please fax to [facility].

## 2018-12-20 ENCOUNTER — Ambulatory Visit (HOSPITAL_COMMUNITY): Payer: Medicare Other

## 2019-01-16 ENCOUNTER — Encounter: Payer: Self-pay | Admitting: Family Medicine

## 2019-01-16 ENCOUNTER — Other Ambulatory Visit: Payer: Self-pay

## 2019-01-16 ENCOUNTER — Ambulatory Visit (INDEPENDENT_AMBULATORY_CARE_PROVIDER_SITE_OTHER): Payer: Medicare Other | Admitting: Family Medicine

## 2019-01-16 VITALS — Ht 66.0 in | Wt 232.0 lb

## 2019-01-16 DIAGNOSIS — E1165 Type 2 diabetes mellitus with hyperglycemia: Secondary | ICD-10-CM

## 2019-01-16 DIAGNOSIS — IMO0001 Reserved for inherently not codable concepts without codable children: Secondary | ICD-10-CM

## 2019-01-16 DIAGNOSIS — Z794 Long term (current) use of insulin: Secondary | ICD-10-CM

## 2019-01-16 NOTE — Patient Instructions (Addendum)
    Thank you for choosing [facility]. I appreciate the opportunity to provide you with the care for your health and wellness. Today we discussed: blood sugars  Follow up: 03/06/2019   Labs as soon as you can get a ride.  Please continue taking medications as directed.  Please continue to work on your eating well balanced meals 3 time a day. Remember to avoid breads, pastas, sauces, chips, fried and sweet foods.  I want to encourage you to walk 30 minutes daily.   Please continue to practice social distancing to keep you, your family, and our community safe.  If you must go out, please wear a Mask and practice good handwashing.  [place] YOUR HANDS WELL AND FREQUENTLY. AVOID TOUCHING YOUR FACE, UNLESS YOUR HANDS ARE FRESHLY WASHED.  GET FRESH AIR DAILY. STAY HYDRATED WITH WATER.   It was a pleasure to see you and I look forward to continuing to work together on your health and well-being. Please do not hesitate to call the office if you need care or have questions about your care.  Have a wonderful day and week. With Gratitude, Cherly Beach, DNP, AGNP-BC

## 2019-01-16 NOTE — Progress Notes (Signed)
Virtual Visit via Telephone Note   This visit type was conducted due to national recommendations for restrictions regarding the COVID-19 Pandemic (e.g. social distancing) in an effort to limit this patient's exposure and mitigate transmission in our community.  Due to her co-morbid illnesses, this patient is at least at moderate risk for complications without adequate follow up.  This format is felt to be most appropriate for this patient at this time.  The patient did not have access to video technology/had technical difficulties with video requiring transitioning to audio format only (telephone).  All issues noted in this document were discussed and addressed.  No physical exam could be performed with this format.   Evaluation Performed:  Follow-up visit  Date:  05/16/7937   ID:  Kathleen [name], DOB [birth year], MRN [id number]  Patient Location: Home Provider Location: Office  Location of Patient: Home Location of Provider: Telehealth Consent was obtain for visit to be over via telehealth. I verified that I am speaking with the correct person using two identifiers.  PCP:  Fayrene Helper, MD   Chief Complaint:  DM f/u  History of Present Illness:    Kathleen [name] is a 68 y.o. female   Diabetes follow-up:  Blood sugars at home are running from 80-130's (fasting) .  Denies hypoglycemia.  Denies polydipsia and polyuria.  Last eye exam was 2020.  Last foot exam in office was at annual visit she reports.  Last A1c 9.3%-. Denies non-healing wounds or rashes. Denies signs of UTI or other infections. Patient follows a low sugar diet and checks feet regularly without concerns. Reports staying hydrated by drinking water.   Today patient denies signs and symptoms of COVID 19 infection including fever, chills, cough, shortness of breath, and headache.  Past Medical, Surgical, Social History, Allergies, and Medications have been Reviewed.   Past Medical History:  Diagnosis Date   . Anemia   . Arthritis   . Chronic kidney disease   . Diabetes mellitus type II   . Dysphagia    unspecified   . GERD (gastroesophageal reflux disease)   . Hyperlipidemia 2000  . Hypertension 1995  . Insomnia   . Low back pain   . Obesity    Past Surgical History:  Procedure Laterality Date  . Carpal tunnel release     left   . CHOLECYSTECTOMY  2007  . DIGIT NAIL REMOVAL  08/2011  . KNEE SURGERY  1.20.2012   arthroscopy LEFT knee partial medial meniscectomy  . TENOTOMY     2,3,4  left foot   . toenail removal    . TUBAL LIGATION       Current Meds  Medication Sig  . ACCU-CHEK SOFTCLIX LANCETS lancets USE TO TEST 3 TIMES DAILY.  Marland Kitchen acetaminophen (TYLENOL ARTHRITIS PAIN) 650 MG CR tablet Take 650 mg by mouth 3 (three) times daily as needed. 1-2 by mouth three times a day as needed pain   . amLODipine (NORVASC) 10 MG tablet TAKE ONE TABLET BY MOUTH ONCE DAILY FOR BLOOD PRESSURE.  Marland Kitchen aspirin (ASPIRIN LOW DOSE) 81 MG tablet Take 81 mg by mouth at bedtime.   . blood glucose meter kit and supplies Test three times daily dx e11.65 accuchek aviva plus if covered  . cholecalciferol (VITAMIN D) 1000 UNITS tablet Take 1,000 Units by mouth daily.  . cloNIDine (CATAPRES) 0.3 MG tablet Take 1 tablet (0.3 mg total) by mouth 3 (three) times daily.  . fluticasone (FLONASE) 50  MCG/ACT nasal spray Place 2 sprays into both nostrils daily as needed.  Marland Kitchen glucose blood (ACCU-CHEK AVIVA PLUS) test strip USE TO TEST 3 TIMES DAILY.  Marland Kitchen Insulin Glargine (LANTUS SOLOSTAR) 100 UNIT/ML Solostar Pen INJECT 60 UNITS INTO THE SKIN TWICE DAILY.  Marland Kitchen Insulin Pen Needle (B-D ULTRAFINE III SHORT PEN) 31G X 8 MM MISC USE DAILY WITH LANTUS.  Marland Kitchen pravastatin (PRAVACHOL) 20 MG tablet Take 1 tablet (20 mg total) by mouth daily.  . ramipril (ALTACE) 10 MG capsule Take 1 capsule (10 mg total) by mouth daily.  Marland Kitchen spironolactone-hydrochlorothiazide (ALDACTAZIDE) 25-25 MG tablet Take 1 tablet by mouth daily.     Allergies:    Patient has no known allergies.   Social History   Tobacco Use  . Smoking status: Never Smoker  . Smokeless tobacco: Never Used  Substance Use Topics  . Alcohol use: No  . Drug use: No     Family Hx: The patient's family history includes Dementia in her sister; Diabetes in her brother, brother, father, mother, and sister; Heart disease in her mother; Hypertension in her mother; Kidney disease in her brother; Kidney disease (age of onset: 7) in her mother.  ROS:   Please see the history of present illness.    All other systems reviewed and are negative.   Labs/Other Tests and Data Reviewed:     Recent Labs: 02/22/2018: Hemoglobin 10.8; Platelets 287 06/22/2018: ALT 12 09/25/2018: BUN 31; Creat 1.59; Potassium 5.3; Sodium 135   Recent Lipid Panel Lab Results  Component Value Date/Time   CHOL 140 06/22/2018 09:59 AM   TRIG 117 06/22/2018 09:59 AM   HDL 39 (L) 06/22/2018 09:59 AM   CHOLHDL 3.6 06/22/2018 09:59 AM   LDLCALC 80 06/22/2018 09:59 AM    Wt Readings from Last 3 Encounters:  01/16/19 232 lb (105.2 kg)  11/15/18 233 lb 1.9 oz (105.7 kg)  10/27/18 237 lb 1.9 oz (107.6 kg)     Objective:    Vital Signs:  Ht 5' 6" (1.676 m)   Wt 232 lb (105.2 kg)   BMI 37.45 kg/m    GEN:  alert and oriented  RESPIRATORY:  mo shortness of breath during conversation PSYCH:  normal mood and affect  ASSESSMENT & PLAN:     1. Diabetes mellitus, insulin dependent (IDDM), uncontrolled ([place]) Kathleen [name] is encouraged to check blood sugar daily as directed. Continue current medications. Is on statin as well. Educated on importance of maintain a well balanced diabetic friendly diet. She is reminded the importance of maintaining  good blood sugars,  taking medications as directed, daily foot care, annual eye exams. Additionally educated about keeping good control over blood pressure and cholesterol as well.  Blood sugars are improving and more stable.  Asked to get labs,  as previously ordered, she is working on getting a ride.   2. Morbid obesity ([place]) Unchanged  Kathleen [name] is re-educated about the importance of exercise daily to help with weight management. A minumum of 30 minutes daily is recommended. Additionally, importance of healthy food choices  with portion control discussed. Encouraged to start a food diary, count calories and to consider joining a  support group if possible.   Filed Weights   01/16/19 0902  Weight: 232 lb (105.2 kg)     Time:   Today, I have spent 10 minutes with the patient with telehealth technology discussing the above problems.    Medication Adjustments/Labs and Tests Ordered: Current medicines are reviewed at  length with the patient today.  Concerns regarding medicines are outlined above.   Tests Ordered: No orders of the defined types were placed in this encounter.   Medication Changes: No orders of the defined types were placed in this encounter.   Disposition:  Follow up 03/06/2019   Signed, Perlie Mayo, NP  01/16/2019 9:39 AM     [facility]

## 2019-01-24 ENCOUNTER — Other Ambulatory Visit: Payer: Self-pay | Admitting: Family Medicine

## 2019-01-24 DIAGNOSIS — E785 Hyperlipidemia, unspecified: Secondary | ICD-10-CM

## 2019-01-31 ENCOUNTER — Other Ambulatory Visit: Payer: Self-pay | Admitting: Family Medicine

## 2019-01-31 DIAGNOSIS — E785 Hyperlipidemia, unspecified: Secondary | ICD-10-CM

## 2019-02-02 DIAGNOSIS — E1165 Type 2 diabetes mellitus with hyperglycemia: Secondary | ICD-10-CM | POA: Diagnosis not present

## 2019-02-02 DIAGNOSIS — E11649 Type 2 diabetes mellitus with hypoglycemia without coma: Secondary | ICD-10-CM | POA: Diagnosis not present

## 2019-02-02 DIAGNOSIS — Z794 Long term (current) use of insulin: Secondary | ICD-10-CM | POA: Diagnosis not present

## 2019-02-02 LAB — COMPLETE METABOLIC PANEL WITH GFR
AG Ratio: 1.2 (calc) (ref 1.0–2.5)
ALT: 12 U/L (ref 6–29)
AST: 12 U/L (ref 10–35)
Albumin: 4 g/dL (ref 3.6–5.1)
Alkaline phosphatase (APISO): 87 U/L (ref 37–153)
BUN/Creatinine Ratio: 15 (calc) (ref 6–22)
BUN: 24 mg/dL (ref 7–25)
CO2: 28 mmol/L (ref 20–32)
Calcium: 9.3 mg/dL (ref 8.6–10.4)
Chloride: 104 mmol/L (ref 98–110)
Creat: 1.55 mg/dL — ABNORMAL HIGH (ref 0.50–0.99)
GFR, Est African American: 39 mL/min/{1.73_m2} — ABNORMAL LOW (ref >=60)
GFR, Est Non African American: 34 mL/min/{1.73_m2} — ABNORMAL LOW (ref >=60)
Globulin: 3.3 g/dL (calc) (ref 1.9–3.7)
Glucose, Bld: 66 mg/dL (ref 65–99)
Potassium: 4.6 mmol/L (ref 3.5–5.3)
Sodium: 139 mmol/L (ref 135–146)
Total Bilirubin: 0.4 mg/dL (ref 0.2–1.2)
Total Protein: 7.3 g/dL (ref 6.1–8.1)

## 2019-02-02 LAB — HEMOGLOBIN A1C
Hgb A1c MFr Bld: 7.7 % of total Hgb — ABNORMAL HIGH (ref <5.7)
Mean Plasma Glucose: 174 (calc)
eAG (mmol/L): 9.7 (calc)

## 2019-02-06 NOTE — Progress Notes (Signed)
Please let Ms Hiney know that her A1c has come down to 7.7% from 9.3%!!!  She is almost to optimal level. Goal is 7% or lower. She has done a great job with this!   Please ask what her fasting sugars have been, and ask if she has seen any under 70. Remind her of next appt.

## 2019-02-06 NOTE — Progress Notes (Signed)
Additionally, kidney function appears stable, even slightly improved.  Continue hydration and eating a well balanced diet. Liver is in range.

## 2019-03-06 ENCOUNTER — Encounter: Payer: Self-pay | Admitting: Family Medicine

## 2019-03-06 ENCOUNTER — Encounter (INDEPENDENT_AMBULATORY_CARE_PROVIDER_SITE_OTHER): Payer: Self-pay

## 2019-03-06 ENCOUNTER — Other Ambulatory Visit: Payer: Self-pay

## 2019-03-06 ENCOUNTER — Ambulatory Visit (INDEPENDENT_AMBULATORY_CARE_PROVIDER_SITE_OTHER): Payer: Medicare Other | Admitting: Family Medicine

## 2019-03-06 VITALS — BP 130/76 | HR 87 | Temp 97.3°F | Ht 66.0 in | Wt 232.0 lb

## 2019-03-06 DIAGNOSIS — E785 Hyperlipidemia, unspecified: Secondary | ICD-10-CM

## 2019-03-06 DIAGNOSIS — Z0000 Encounter for general adult medical examination without abnormal findings: Secondary | ICD-10-CM

## 2019-03-06 DIAGNOSIS — I10 Essential (primary) hypertension: Secondary | ICD-10-CM

## 2019-03-06 DIAGNOSIS — E1165 Type 2 diabetes mellitus with hyperglycemia: Secondary | ICD-10-CM | POA: Diagnosis not present

## 2019-03-06 DIAGNOSIS — Z794 Long term (current) use of insulin: Secondary | ICD-10-CM

## 2019-03-06 DIAGNOSIS — Z23 Encounter for immunization: Secondary | ICD-10-CM

## 2019-03-06 DIAGNOSIS — E559 Vitamin D deficiency, unspecified: Secondary | ICD-10-CM

## 2019-03-06 NOTE — Patient Instructions (Addendum)
  Phone/ video visit with MD 2nd week in January to review labs Please reschedule her mammogram at checkout , past due  Microalb today  Please call and schedule appt for foot care, nails are long  Please schedule your eye exam for end January when due, call now so you get your appointment  First week in [place], lipid, cmp and EGFR, hBA1C, TSH and vit D,  No med changes at this time  Thanks for choosing [facility], we consider it a privelige to serve you.  CONGRATS on much improved blood sugar, keep it up!

## 2019-03-07 ENCOUNTER — Other Ambulatory Visit (HOSPITAL_COMMUNITY)
Admission: RE | Admit: 2019-03-07 | Discharge: 2019-03-07 | Disposition: A | Discharge status: Home / Self Care | Payer: Medicare Other | Source: Ambulatory Visit | Attending: Family Medicine | Admitting: Family Medicine

## 2019-03-07 DIAGNOSIS — Z794 Long term (current) use of insulin: Secondary | ICD-10-CM | POA: Insufficient documentation

## 2019-03-07 DIAGNOSIS — E1165 Type 2 diabetes mellitus with hyperglycemia: Secondary | ICD-10-CM | POA: Insufficient documentation

## 2019-03-08 LAB — MICROALBUMIN, URINE: Microalb, Ur: <3 ug/mL — ABNORMAL HIGH

## 2019-03-11 ENCOUNTER — Encounter: Payer: Self-pay | Admitting: Family Medicine

## 2019-03-11 NOTE — Progress Notes (Signed)
Kathleen [name]     MRN: [id number]      DOB: [birth year]  HPI: Patient is in for annual physical exam. No other health concerns are expressed or addressed at the visit. Recent labs, if available are reviewed. Immunization is reviewed , and  updated if needed.   PE:BP 130/76    Pulse 87    Temp (!) 97.3 F (36.3 C) (Temporal)    Ht 5\' 6"  (1.676 m)    Wt 232 lb (105.2 kg)    SpO2 97%    BMI 37.45 kg/m   Pleasant  female, alert and oriented x 3, in no cardio-pulmonary distress. Afebrile. HEENT No facial trauma or asymetry. Sinuses non tender.  Extra occullar muscles intact.. External ears normal, . Neck: supple, no adenopathy,JVD or thyromegaly.No bruits.  Chest: Clear to ascultation bilaterally.No crackles or wheezes. Non tender to palpation  Breast: No asymetry,no masses or lumps. No tenderness. No nipple discharge or inversion. No axillary or supraclavicular adenopathy  Cardiovascular system; Heart sounds normal,  S1 and  S2 ,no S3.  No murmur, or thrill. Apical beat not displaced Peripheral pulses normal.  Abdomen: Soft, non tender, no organomegaly or masses. No bruits. Bowel sounds normal. No guarding, tenderness or rebound.    Musculoskeletal exam: Decreased , though adequate  ROM of spine, hips , shoulders and knees. No deformity ,swelling or crepitus noted. No muscle wasting or atrophy.   Neurologic: Cranial nerves 2 to 12 intact. Power, tone ,sensation and reflexes normal throughout. No disturbance in gait. No tremor.  Skin: Intact, no ulceration, erythema , scaling or rash noted. Pigmentation normal throughout, long curved toenails  Psych; Normal mood and affect. Judgement and concentration normal   Assessment & Plan:  Annual physical exam Annual exam as documented. Counseling done  re healthy lifestyle involving commitment to 150 minutes exercise per week, heart healthy diet, and attaining healthy weight.The importance of adequate sleep also  discussed. Regular seat belt use and home safety, is also discussed. Changes in health habits are decided on by the patient with goals and time frames  set for achieving them. Immunization and cancer screening needs are specifically addressed at this visit.   Type 2 diabetes mellitus with vascular disease (HCC) Marked improvement , she s applauded on this Updated lab needed at/ before next visit. Ms. Delange is reminded of the importance of commitment to daily physical activity for 30 minutes or more, as able and the need to limit carbohydrate intake to 30 to 60 grams per meal to help with blood sugar control.   The need to take medication as prescribed, test blood sugar as directed, and to call between visits if there is a concern that blood sugar is uncontrolled is also discussed.   Ms. Fairbairn is reminded of the importance of daily foot exam, annual eye examination, and good blood sugar, blood pressure and cholesterol control.  Diabetic Labs Latest Ref Rng & Units 03/07/2019 02/02/2019 09/25/2018 06/22/2018 04/03/2018  HbA1c <5.7 % of total Hgb - 7.7(H) 9.3(H) 9.8(H) -  Microalbumin Not Estab. ug/mL <3.0(H) - - - -  Micro/Creat Ratio 0.0 - 30.0 mg/g creat - - - - -  Chol <200 mg/dL - - - 140 -  HDL > OR = 50 mg/dL - - - 39(L) -  Calc LDL mg/dL (calc) - - - 80 -  Triglycerides <150 mg/dL - - - 117 -  Creatinine 0.50 - 0.99 mg/dL - 1.55(H) 1.59(H) 1.54(H) 1.61(H)   BP/Weight 03/06/2019  01/16/2019 11/15/2018 10/27/2018 09/25/2018 09/20/2018 0/73/7106  Systolic BP 269 - 485 462 703 500 938  Diastolic BP 76 - 56 76 60 82 82  Wt. (Lbs) 232 232 233.12 237.12 234.08 223 234  BMI 37.45 37.45 37.63 38.27 37.78 35.99 37.77   Foot/eye exam completion dates Latest Ref Rng & Units 03/06/2019 06/22/2018  Eye Exam No Retinopathy - -  Foot Form Completion - Done Done

## 2019-03-11 NOTE — Assessment & Plan Note (Signed)
Marked improvement , she s applauded on this Updated lab needed at/ before next visit. Kathleen [name] is reminded of the importance of commitment to daily physical activity for 30 minutes or more, as able and the need to limit carbohydrate intake to 30 to 60 grams per meal to help with blood sugar control.   The need to take medication as prescribed, test blood sugar as directed, and to call between visits if there is a concern that blood sugar is uncontrolled is also discussed.   Kathleen [name] is reminded of the importance of daily foot exam, annual eye examination, and good blood sugar, blood pressure and cholesterol control.  Diabetic Labs Latest Ref Rng & Units 03/07/2019 02/02/2019 09/25/2018 06/22/2018 04/03/2018  HbA1c <5.7 % of total Hgb - 7.7(H) 9.3(H) 9.8(H) -  Microalbumin Not Estab. ug/mL <3.0(H) - - - -  Micro/Creat Ratio 0.0 - 30.0 mg/g creat - - - - -  Chol <200 mg/dL - - - 140 -  HDL > OR = 50 mg/dL - - - 39(L) -  Calc LDL mg/dL (calc) - - - 80 -  Triglycerides <150 mg/dL - - - 117 -  Creatinine 0.50 - 0.99 mg/dL - 1.55(H) 1.59(H) 1.54(H) 1.61(H)   BP/Weight 03/06/2019 01/16/2019 11/15/2018 10/27/2018 09/25/2018 09/20/2018 1/00/7121  Systolic BP 975 - 883 254 982 641 583  Diastolic BP 76 - 56 76 60 82 82  Wt. (Lbs) 232 232 233.12 237.12 234.08 223 234  BMI 37.45 37.45 37.63 38.27 37.78 35.99 37.77   Foot/eye exam completion dates Latest Ref Rng & Units 03/06/2019 06/22/2018  Eye Exam No Retinopathy - -  Foot Form Completion - Done Done

## 2019-03-11 NOTE — Assessment & Plan Note (Signed)

## 2019-03-29 ENCOUNTER — Ambulatory Visit (HOSPITAL_COMMUNITY): Payer: Medicare Other

## 2019-04-06 ENCOUNTER — Other Ambulatory Visit: Payer: Self-pay | Admitting: Family Medicine

## 2019-04-24 ENCOUNTER — Other Ambulatory Visit: Payer: Self-pay | Admitting: Family Medicine

## 2019-05-11 DIAGNOSIS — R918 Other nonspecific abnormal finding of lung field: Secondary | ICD-10-CM | POA: Insufficient documentation

## 2019-05-11 DIAGNOSIS — R1310 Dysphagia, unspecified: Secondary | ICD-10-CM | POA: Insufficient documentation

## 2019-05-11 DIAGNOSIS — N183 Chronic kidney disease, stage 3 unspecified: Secondary | ICD-10-CM | POA: Diagnosis present

## 2019-05-11 DIAGNOSIS — E041 Nontoxic single thyroid nodule: Secondary | ICD-10-CM | POA: Insufficient documentation

## 2019-05-11 DIAGNOSIS — Z9981 Dependence on supplemental oxygen: Secondary | ICD-10-CM | POA: Insufficient documentation

## 2019-05-21 ENCOUNTER — Other Ambulatory Visit: Payer: Self-pay

## 2019-05-21 ENCOUNTER — Encounter: Payer: Self-pay | Admitting: Family Medicine

## 2019-05-21 ENCOUNTER — Ambulatory Visit (INDEPENDENT_AMBULATORY_CARE_PROVIDER_SITE_OTHER): Payer: Medicare Other | Admitting: Family Medicine

## 2019-05-21 VITALS — BP 130/70 | HR 80 | Ht 66.0 in | Wt 232.0 lb

## 2019-05-21 DIAGNOSIS — E785 Hyperlipidemia, unspecified: Secondary | ICD-10-CM

## 2019-05-21 DIAGNOSIS — I10 Essential (primary) hypertension: Secondary | ICD-10-CM | POA: Diagnosis not present

## 2019-05-21 DIAGNOSIS — E1159 Type 2 diabetes mellitus with other circulatory complications: Secondary | ICD-10-CM | POA: Diagnosis not present

## 2019-05-21 MED ORDER — BLOOD GLUCOSE METER KIT: PACK | 0 refills | Status: DC

## 2019-05-21 NOTE — Progress Notes (Signed)
Virtual Visit via Telephone Note  I connected with Olubunmi Rothenberger Wetmore on 78/[birth year]/85 at  9:00 AM EST by telephone and verified that I am speaking with the correct person using two identifiers.  Location: Patient: home Provider: office   I discussed the limitations, risks, security and privacy concerns of performing an evaluation and management service by telephone and the availability of in person appointments. I also discussed with the patient that there may be a patient responsible charge related to this service. The patient expressed understanding and agreed to proceed.   History of Present Illness: F/U chronic problems, medication review, and refill medication when necessary. Review most recent labs and order labs which are due Review preventive health and update with necessary referrals or immunizations as indicated    f/U chronic problems , medication review and update RHM Denies recent fever or chills. Denies sinus pressure, nasal congestion, ear pain or sore throat. Denies chest congestion, productive cough or wheezing. Denies chest pains, palpitations and leg swelling Denies abdominal pain, nausea, vomiting,diarrhea or constipation.   Denies dysuria, frequency, hesitancy or incontinence. Denies joint pain, swelling and limitation in mobility. Denies headaches, seizures, numbness, or tingling. Denies depression, anxiety or insomnia.c/o increased an uncontrolled blood sugarDenies skin break down or rash.    Denies polyuria, polydipsia, blurred vision , or hypoglycemic episodes.   Observations/Objec.  BP 130/70   Pulse 80   Ht 5\' 6"  (1.676 m)   Wt 232 lb (105.2 kg)   BMI 37.45 kg/m  Good communication with no confusion and intact memory. Alert and oriented x 3 No signs of respiratory distress during speech   Assessment and Plan:  Morbid obesity ([place]) Obesity linked with hyypertension and diabetes  Patient re-educated about  the importance of commitment to a  minimum  of 150 minutes of exercise per week as able.  The importance of healthy food choices with portion control discussed, as well as eating regularly and within a 12 hour window most days. The need to choose "clean , green" food 50 to 75% of the time is discussed, as well as to make water the primary drink and set a goal of 64 ounces water daily.    Weight /BMI 05/21/2019 03/06/2019 01/16/2019  WEIGHT 232 lb 232 lb 232 lb  HEIGHT 5\' 6"  5\' 6"  5\' 6"   BMI 37.45 kg/m2 37.45 kg/m2 37.45 kg/m2      Hyperlipidemia LDL goal <100 Hyperlipidemia:Low fat diet discussed and encouraged.   Lipid Panel  Lab Results  Component Value Date   CHOL 157 05/24/2019   HDL 36 (L) 05/24/2019   LDLCALC 102 (H) 05/24/2019   TRIG 95 05/24/2019   CHOLHDL 4.4 05/24/2019   Not at goal, needs to lower fat intake    Essential hypertension Controlled, no change in medication DASH diet and commitment to daily physical activity for a minimum of 30 minutes discussed and encouraged, as a part of hypertension management. The importance of attaining a healthy weight is also discussed.  BP/Weight 05/21/2019 03/06/2019 01/16/2019 11/15/2018 10/27/2018 09/25/2018 0/27/7412  Systolic BP 878 676 - 720 947 096 283  Diastolic BP 70 76 - 56 76 60 82  Wt. (Lbs) 232 232 232 233.12 237.12 234.08 223  BMI 37.45 37.45 37.45 37.63 38.27 37.78 35.99       Type 2 diabetes mellitus with vascular disease ([place]) Ms. Riedl is reminded of the importance of commitment to daily physical activity for 30 minutes or more, as able and the need to limit carbohydrate  intake to 30 to 60 grams per meal to help with blood sugar control.   The need to take medication as prescribed, test blood sugar as directed, and to call between visits if there is a concern that blood sugar is uncontrolled is also discussed.   Ms. Channing is reminded of the importance of daily foot exam, annual eye examination, and good blood sugar, blood pressure and cholesterol  control. Uncontrolled, needs to change diet and increase exercisre Diabetic Labs Latest Ref Rng & Units 05/24/2019 03/07/2019 02/02/2019 09/25/2018 06/22/2018  HbA1c <5.7 % of total Hgb 8.3(H) - 7.7(H) 9.3(H) 9.8(H)  Microalbumin mg/dL 1.3 <3.0(H) - - -  Micro/Creat Ratio 0.0 - 30.0 mg/g creat - - - - -  Chol <200 mg/dL 157 - - - 140  HDL > OR = 50 mg/dL 36(L) - - - 39(L)  Calc LDL mg/dL (calc) 102(H) - - - 80  Triglycerides <150 mg/dL 95 - - - 117  Creatinine 0.60 - 0.93 mg/dL 1.62(H) - 1.55(H) 1.59(H) 1.54(H)   BP/Weight 05/21/2019 03/06/2019 01/16/2019 11/15/2018 10/27/2018 09/25/2018 4/59/9774  Systolic BP 142 395 - 320 233 435 686  Diastolic BP 70 76 - 56 76 60 82  Wt. (Lbs) 232 232 232 233.12 237.12 234.08 223  BMI 37.45 37.45 37.45 37.63 38.27 37.78 35.99   Foot/eye exam completion dates Latest Ref Rng & Units 03/06/2019 06/22/2018  Eye Exam No Retinopathy - -  Foot Form Completion - Done Done         Follow Up Instructions:    I discussed the assessment and treatment plan with the patient. The patient was provided an opportunity to ask questions and all were answered. The patient agreed with the plan and demonstrated an understanding of the instructions.   The patient was advised to call back or seek an in-person evaluation if the symptoms worsen or if the condition fails to improve as anticipated.  I provided 18 minutes of non-face-to-face time during this encounter.   Tula Nakayama, MD

## 2019-05-21 NOTE — Patient Instructions (Signed)
F/u in office with MD in early may, call if you need me sooner  Please schedule mammogram for a Friday around 11 am and let pt know  Please get labs this week (allready ordered)  Nurse will send in new meter and testing supplies for 3 times daily testing  It is important that you exercise regularly at least 30 minutes 5 times a week. If you develop chest pain, have severe difficulty breathing, or feel very tired, stop exercising immediately and seek medical attention   [place] Thanks for choosing [facility], we consider it a privelige to serve you.  Best for 2021!

## 2019-05-24 DIAGNOSIS — E1165 Type 2 diabetes mellitus with hyperglycemia: Secondary | ICD-10-CM | POA: Diagnosis not present

## 2019-05-24 DIAGNOSIS — E559 Vitamin D deficiency, unspecified: Secondary | ICD-10-CM | POA: Diagnosis not present

## 2019-05-24 DIAGNOSIS — I10 Essential (primary) hypertension: Secondary | ICD-10-CM | POA: Diagnosis not present

## 2019-05-24 DIAGNOSIS — E785 Hyperlipidemia, unspecified: Secondary | ICD-10-CM | POA: Diagnosis not present

## 2019-05-25 LAB — LIPID PANEL
Cholesterol: 157 mg/dL (ref <200)
HDL: 36 mg/dL — ABNORMAL LOW (ref >=50)
LDL Cholesterol (Calc): 102 mg/dL (calc) — ABNORMAL HIGH
Non-HDL Cholesterol (Calc): 121 mg/dL (calc) (ref <130)
Total CHOL/HDL Ratio: 4.4 (calc) (ref <5.0)
Triglycerides: 95 mg/dL (ref <150)

## 2019-05-25 LAB — COMPLETE METABOLIC PANEL WITH GFR
AG Ratio: 1.1 (calc) (ref 1.0–2.5)
ALT: 13 U/L (ref 6–29)
AST: 13 U/L (ref 10–35)
Albumin: 4.1 g/dL (ref 3.6–5.1)
Alkaline phosphatase (APISO): 85 U/L (ref 37–153)
BUN/Creatinine Ratio: 16 (calc) (ref 6–22)
BUN: 26 mg/dL — ABNORMAL HIGH (ref 7–25)
CO2: 28 mmol/L (ref 20–32)
Calcium: 9.7 mg/dL (ref 8.6–10.4)
Chloride: 103 mmol/L (ref 98–110)
Creat: 1.62 mg/dL — ABNORMAL HIGH (ref 0.60–0.93)
GFR, Est African American: 37 mL/min/{1.73_m2} — ABNORMAL LOW (ref >=60)
GFR, Est Non African American: 32 mL/min/{1.73_m2} — ABNORMAL LOW (ref >=60)
Globulin: 3.6 g/dL (calc) (ref 1.9–3.7)
Glucose, Bld: 74 mg/dL (ref 65–99)
Potassium: 4.5 mmol/L (ref 3.5–5.3)
Sodium: 139 mmol/L (ref 135–146)
Total Bilirubin: 0.4 mg/dL (ref 0.2–1.2)
Total Protein: 7.7 g/dL (ref 6.1–8.1)

## 2019-05-25 LAB — CBC
HCT: 35 % (ref 35.0–45.0)
Hemoglobin: 11 g/dL — ABNORMAL LOW (ref 11.7–15.5)
MCH: 26.1 pg — ABNORMAL LOW (ref 27.0–33.0)
MCHC: 31.4 g/dL — ABNORMAL LOW (ref 32.0–36.0)
MCV: 83.1 fL (ref 80.0–100.0)
MPV: 10.9 fL (ref 7.5–12.5)
Platelets: 314 10*3/uL (ref 140–400)
RBC: 4.21 10*6/uL (ref 3.80–5.10)
RDW: 13.8 % (ref 11.0–15.0)
WBC: 6.8 10*3/uL (ref 3.8–10.8)

## 2019-05-25 LAB — HEMOGLOBIN A1C
Hgb A1c MFr Bld: 8.3 % of total Hgb — ABNORMAL HIGH (ref <5.7)
Mean Plasma Glucose: 192 (calc)
eAG (mmol/L): 10.6 (calc)

## 2019-05-25 LAB — MICROALBUMIN, URINE: Microalb, Ur: 1.3 mg/dL

## 2019-05-25 LAB — TSH: TSH: 1.09 mIU/L (ref 0.40–4.50)

## 2019-05-25 LAB — VITAMIN D 25 HYDROXY (VIT D DEFICIENCY, FRACTURES): Vit D, 25-Hydroxy: 14 ng/mL — ABNORMAL LOW (ref 30–100)

## 2019-05-26 ENCOUNTER — Encounter: Payer: Self-pay | Admitting: Family Medicine

## 2019-05-26 NOTE — Assessment & Plan Note (Signed)
Hyperlipidemia:Low fat diet discussed and encouraged.   Lipid Panel  Lab Results  Component Value Date   CHOL 157 05/24/2019   HDL 36 (L) 05/24/2019   LDLCALC 102 (H) 05/24/2019   TRIG 95 05/24/2019   CHOLHDL 4.4 05/24/2019   Not at goal, needs to lower fat intake

## 2019-05-26 NOTE — Assessment & Plan Note (Signed)
Kathleen [name] is reminded of the importance of commitment to daily physical activity for 30 minutes or more, as able and the need to limit carbohydrate intake to 30 to 60 grams per meal to help with blood sugar control.   The need to take medication as prescribed, test blood sugar as directed, and to call between visits if there is a concern that blood sugar is uncontrolled is also discussed.   Kathleen [name] is reminded of the importance of daily foot exam, annual eye examination, and good blood sugar, blood pressure and cholesterol control. Uncontrolled, needs to change diet and increase exercisre Diabetic Labs Latest Ref Rng & Units 05/24/2019 03/07/2019 02/02/2019 09/25/2018 06/22/2018  HbA1c <5.7 % of total Hgb 8.3(H) - 7.7(H) 9.3(H) 9.8(H)  Microalbumin mg/dL 1.3 <3.0(H) - - -  Micro/Creat Ratio 0.0 - 30.0 mg/g creat - - - - -  Chol <200 mg/dL 157 - - - 140  HDL > OR = 50 mg/dL 36(L) - - - 39(L)  Calc LDL mg/dL (calc) 102(H) - - - 80  Triglycerides <150 mg/dL 95 - - - 117  Creatinine 0.60 - 0.93 mg/dL 1.62(H) - 1.55(H) 1.59(H) 1.54(H)   BP/Weight 05/21/2019 03/06/2019 01/16/2019 11/15/2018 10/27/2018 09/25/2018 0/02/711  Systolic BP 197 588 - 325 498 264 158  Diastolic BP 70 76 - 56 76 60 82  Wt. (Lbs) 232 232 232 233.12 237.12 234.08 223  BMI 37.45 37.45 37.45 37.63 38.27 37.78 35.99   Foot/eye exam completion dates Latest Ref Rng & Units 03/06/2019 06/22/2018  Eye Exam No Retinopathy - -  Foot Form Completion - Done Done

## 2019-05-26 NOTE — Assessment & Plan Note (Signed)
Controlled, no change in medication DASH diet and commitment to daily physical activity for a minimum of 30 minutes discussed and encouraged, as a part of hypertension management. The importance of attaining a healthy weight is also discussed.  BP/Weight 05/21/2019 03/06/2019 01/16/2019 11/15/2018 10/27/2018 09/25/2018 10/09/6578  Systolic BP 063 494 - 944 739 584 417  Diastolic BP 70 76 - 56 76 60 82  Wt. (Lbs) 232 232 232 233.12 237.12 234.08 223  BMI 37.45 37.45 37.45 37.63 38.27 37.78 35.99

## 2019-05-26 NOTE — Assessment & Plan Note (Signed)
Obesity linked with hyypertension and diabetes  Patient re-educated about  the importance of commitment to a  minimum of 150 minutes of exercise per week as able.  The importance of healthy food choices with portion control discussed, as well as eating regularly and within a 12 hour window most days. The need to choose "clean , green" food 50 to 75% of the time is discussed, as well as to make water the primary drink and set a goal of 64 ounces water daily.    Weight /BMI 05/21/2019 03/06/2019 01/16/2019  WEIGHT 232 lb 232 lb 232 lb  HEIGHT 5\' 6"  5\' 6"  5\' 6"   BMI 37.45 kg/m2 37.45 kg/m2 37.45 kg/m2

## 2019-05-31 ENCOUNTER — Other Ambulatory Visit: Payer: Self-pay

## 2019-05-31 DIAGNOSIS — I10 Essential (primary) hypertension: Secondary | ICD-10-CM

## 2019-05-31 DIAGNOSIS — E1159 Type 2 diabetes mellitus with other circulatory complications: Secondary | ICD-10-CM

## 2019-05-31 DIAGNOSIS — E785 Hyperlipidemia, unspecified: Secondary | ICD-10-CM

## 2019-05-31 MED ORDER — PRAVASTATIN SODIUM 40 MG PO TABS: 40.0000 mg | ORAL_TABLET | Freq: Every day | ORAL | 5 refills | Status: DC

## 2019-05-31 MED ORDER — GLIPIZIDE ER 5 MG PO TB24: 5.0000 mg | ORAL_TABLET | Freq: Every day | ORAL | 5 refills | Status: DC

## 2019-06-01 ENCOUNTER — Ambulatory Visit (HOSPITAL_COMMUNITY): Payer: Medicare Other

## 2019-06-11 ENCOUNTER — Ambulatory Visit (HOSPITAL_COMMUNITY): Payer: Medicare Other

## 2019-06-25 ENCOUNTER — Other Ambulatory Visit: Payer: Self-pay

## 2019-06-25 ENCOUNTER — Ambulatory Visit (HOSPITAL_COMMUNITY)
Admission: RE | Admit: 2019-06-25 | Discharge: 2019-06-25 | Disposition: A | Discharge status: Home / Self Care | Payer: Medicare Other | Source: Ambulatory Visit | Attending: Family Medicine | Admitting: Family Medicine

## 2019-06-25 DIAGNOSIS — Z1231 Encounter for screening mammogram for malignant neoplasm of breast: Secondary | ICD-10-CM | POA: Diagnosis not present

## 2019-06-25 MED ORDER — LANTUS SOLOSTAR 100 UNIT/ML ~~LOC~~ SOPN: 60.0000 [IU] | PEN_INJECTOR | Freq: Two times a day (BID) | SUBCUTANEOUS | 0 refills | Status: DC

## 2019-07-31 ENCOUNTER — Other Ambulatory Visit: Payer: Self-pay | Admitting: Family Medicine

## 2019-08-01 ENCOUNTER — Other Ambulatory Visit: Payer: Self-pay | Admitting: *Deleted

## 2019-08-01 MED ORDER — LANTUS SOLOSTAR 100 UNIT/ML ~~LOC~~ SOPN: 60.0000 [IU] | PEN_INJECTOR | Freq: Two times a day (BID) | SUBCUTANEOUS | 0 refills | Status: DC

## 2019-08-09 ENCOUNTER — Other Ambulatory Visit: Payer: Self-pay | Admitting: Family Medicine

## 2019-09-03 ENCOUNTER — Ambulatory Visit (INDEPENDENT_AMBULATORY_CARE_PROVIDER_SITE_OTHER): Payer: Medicare Other | Admitting: Family Medicine

## 2019-09-03 ENCOUNTER — Other Ambulatory Visit: Payer: Self-pay

## 2019-09-03 ENCOUNTER — Encounter: Payer: Self-pay | Admitting: Family Medicine

## 2019-09-03 ENCOUNTER — Ambulatory Visit (HOSPITAL_COMMUNITY)
Admission: RE | Admit: 2019-09-03 | Discharge: 2019-09-03 | Disposition: A | Discharge status: Home / Self Care | Payer: Medicare Other | Source: Ambulatory Visit | Attending: Family Medicine | Admitting: Family Medicine

## 2019-09-03 VITALS — BP 142/80 | HR 100 | Temp 97.6°F | Ht 66.0 in | Wt 233.4 lb

## 2019-09-03 DIAGNOSIS — M25561 Pain in right knee: Secondary | ICD-10-CM

## 2019-09-03 DIAGNOSIS — M1711 Unilateral primary osteoarthritis, right knee: Secondary | ICD-10-CM | POA: Diagnosis not present

## 2019-09-03 NOTE — Patient Instructions (Addendum)
  Knee xray Tylenol 500mg  1 every 4-6 hours -max of 6/day Voltaren gel-use on the knee as needed for pain Ortho referral -right knee  If you have lab work done today you will be contacted with your lab results within the next 2 weeks.  If you have not heard from [place] then please contact us. The fastest way to get your results is to register for My Chart.   IF you received an x-ray today, you will receive an invoice from [facility]. Please contact [facility] at [phone] with questions or concerns regarding your invoice.   IF you received labwork today, you will receive an invoice from [facility]. Please contact [facility] at [phone] with questions or concerns regarding your invoice.   Our billing staff will not be able to assist you with questions regarding bills from these companies.  You will be contacted with the lab results as soon as they are available. The fastest way to get your results is to activate your My Chart account. Instructions are located on the last page of this paperwork. If you have not heard from [place] regarding the results in 2 weeks, please contact this office.

## 2019-09-03 NOTE — Progress Notes (Signed)
Established Patient Office Visit  Subjective:  Patient ID: Kathleen [name], female    DOB: 28-Sep-1950  Age: 69 y.o. MRN: [id number]  CC:  Chief Complaint  Patient presents with  . Leg Pain    right leg pain behind knee. been going on since after Easter    HPI [place] presents for right knee pain-posterior. Pt denies injury. No change in activities. Pt with no injections in the past. Pt with previous problems with the left knee. H/o of arthritis-multiple joints  Past Medical History:  Diagnosis Date  . Anemia   . Arthritis   . Chronic kidney disease   . Diabetes mellitus type II   . Dysphagia    unspecified   . GERD (gastroesophageal reflux disease)   . Hyperlipidemia 2000  . Hypertension 1995  . Insomnia   . Low back pain   . Obesity     Past Surgical History:  Procedure Laterality Date  . Carpal tunnel release     left   . CHOLECYSTECTOMY  2007  . DIGIT NAIL REMOVAL  08/2011  . KNEE SURGERY  1.20.2012   arthroscopy LEFT knee partial medial meniscectomy  . TENOTOMY     2,3,4  left foot   . toenail removal    . TUBAL LIGATION      Family History  Problem Relation Age of Onset  . Diabetes Mother   . Hypertension Mother        MI  . Heart disease Mother   . Kidney disease Mother 82       dialysis  . Diabetes Sister   . Diabetes Brother   . Diabetes Brother   . Kidney disease Brother   . Dementia Sister   . Diabetes Father     Social History   Socioeconomic History  . Marital status: Divorced    Spouse name: Not on file  . Number of children: 4  . Years of education: Not on file  . Highest education level: 11th grade  Occupational History  . Occupation: Full time Engineer, petroleum   Tobacco Use  . Smoking status: Never Smoker  . Smokeless tobacco: Never Used  Substance and Sexual Activity  . Alcohol use: No  . Drug use: No  . Sexual activity: Yes  Other Topics Concern  . Not on file  Social History Narrative   Lives with 2nd oldest son    Social Determinants of Health   Financial Resource Strain:   . Difficulty of Paying Living Expenses:   Food Insecurity:   . Worried About Charity fundraiser in the Last Year:   . Arboriculturist in the Last Year:   Transportation Needs:   . Film/video editor (Medical):   Marland Kitchen Lack of Transportation (Non-Medical):   Physical Activity:   . Days of Exercise per Week:   . Minutes of Exercise per Session:   Stress:   . Feeling of Stress :   Social Connections:   . Frequency of Communication with Friends and Family:   . Frequency of Social Gatherings with Friends and Family:   . Attends Religious Services:   . Active Member of Clubs or Organizations:   . Attends Archivist Meetings:   Marland Kitchen Marital Status:   Intimate Partner Violence:   . Fear of Current or Ex-Partner:   . Emotionally Abused:   Marland Kitchen Physically Abused:   . Sexually Abused:     Outpatient Medications Prior to Visit  Medication Sig Dispense Refill  . ACCU-CHEK SOFTCLIX LANCETS lancets USE TO TEST 3 TIMES DAILY. 100 each PRN  . acetaminophen (TYLENOL ARTHRITIS PAIN) 650 MG CR tablet Take 650 mg by mouth 3 (three) times daily as needed. 1-2 by mouth three times a day as needed pain     . amLODipine (NORVASC) 10 MG tablet TAKE ONE TABLET BY MOUTH ONCE DAILY FOR BLOOD PRESSURE. 90 tablet 0  . aspirin (ASPIRIN LOW DOSE) 81 MG tablet Take 81 mg by mouth at bedtime.     . blood glucose meter kit and supplies Test three times daily dx e11.65 accuchek aviva plus if covered 1 each 0  . blood glucose meter kit and supplies Dispense based on patient and insurance preference.Use to test blood sugars three times daily. (FOR ICD-10 E10.9, E11.9). 1 each 0  . cholecalciferol (VITAMIN D) 1000 UNITS tablet Take 1,000 Units by mouth daily.    . cloNIDine (CATAPRES) 0.3 MG tablet TAKE (1) TABLET BY MOUTH 3 TIMES DAILY. 270 tablet 0  . fluticasone (FLONASE) 50 MCG/ACT nasal spray Place 2 sprays into both nostrils daily as needed.  15 g 1  . glipiZIDE (GLUCOTROL XL) 5 MG 24 hr tablet Take 1 tablet (5 mg total) by mouth daily with breakfast. 30 tablet 5  . glucose blood (ACCU-CHEK AVIVA PLUS) test strip USE TO TEST 3 TIMES DAILY. 100 each PRN  . insulin glargine (LANTUS SOLOSTAR) 100 UNIT/ML Solostar Pen Inject 60 Units into the skin 2 (two) times daily. 30 mL 0  . Insulin Pen Needle (B-D ULTRAFINE III SHORT PEN) 31G X 8 MM MISC USE DAILY WITH LANTUS. 100 each 3  . pravastatin (PRAVACHOL) 40 MG tablet Take 1 tablet (40 mg total) by mouth daily. 30 tablet 5  . ramipril (ALTACE) 10 MG capsule TAKE (1) CAPSULE BY MOUTH ONCE DAILY. 90 capsule 0  . spironolactone-hydrochlorothiazide (ALDACTAZIDE) 25-25 MG tablet Take 1 tablet by mouth daily. 90 tablet 3   No facility-administered medications prior to visit.    No Known Allergies  ROS Review of Systems  Musculoskeletal: Positive for arthralgias and gait problem.      Objective:    Physical Exam  Constitutional: She is oriented to person, place, and time. She appears well-developed and well-nourished.  Musculoskeletal:        General: Tenderness present.     Right knee: Tenderness present.       Legs:     Comments: Tenderness to palpation -right posterior knee FROM right knee  Neurological: She is alert and oriented to person, place, and time.    BP (!) 142/80 (BP Location: Right Arm, Patient Position: Sitting, Cuff Size: Large)   Pulse 100   Temp 97.6 F (36.4 C) (Temporal)   Ht 5' 6" (1.676 m)   Wt 233 lb 6.4 oz (105.9 kg)   SpO2 96%   BMI 37.67 kg/m  Wt Readings from Last 3 Encounters:  09/03/19 233 lb 6.4 oz (105.9 kg)  05/21/19 232 lb (105.2 kg)  03/06/19 232 lb (105.2 kg)     Health Maintenance Due  Topic Date Due  . OPHTHALMOLOGY EXAM  06/07/2019     Lab Results  Component Value Date   TSH 1.09 05/24/2019   Lab Results  Component Value Date   WBC 6.8 05/24/2019   HGB 11.0 (L) 05/24/2019   HCT 35.0 05/24/2019   MCV 83.1 05/24/2019     PLT 314 05/24/2019   Lab Results  Component Value Date     NA 139 05/24/2019   K 4.5 05/24/2019   CO2 28 05/24/2019   GLUCOSE 74 05/24/2019   BUN 26 (H) 05/24/2019   CREATININE 1.62 (H) 05/24/2019   BILITOT 0.4 05/24/2019   ALKPHOS 94 01/06/2017   AST 13 05/24/2019   ALT 13 05/24/2019   PROT 7.7 05/24/2019   ALBUMIN 4.0 01/06/2017   CALCIUM 9.7 05/24/2019   Lab Results  Component Value Date   CHOL 157 05/24/2019   Lab Results  Component Value Date   HDL 36 (L) 05/24/2019   Lab Results  Component Value Date   LDLCALC 102 (H) 05/24/2019   Lab Results  Component Value Date   TRIG 95 05/24/2019   Lab Results  Component Value Date   CHOLHDL 4.4 05/24/2019   Lab Results  Component Value Date   HGBA1C 8.3 (H) 05/24/2019      Assessment & Plan:   1. Acute pain of right knee Arthritis-tylenol/voltaren gel - DG Knee Complete 4 Views Right; Future - Ambulatory referral to Orthopedic Surgery Follow-up: ortho-additional eval/xrays today  LISA LEIGH CORUM, MD 

## 2019-09-04 ENCOUNTER — Encounter: Payer: Self-pay | Admitting: Emergency Medicine

## 2019-09-04 ENCOUNTER — Other Ambulatory Visit: Payer: Self-pay | Admitting: Family Medicine

## 2019-09-06 ENCOUNTER — Other Ambulatory Visit: Payer: Self-pay | Admitting: *Deleted

## 2019-09-06 ENCOUNTER — Ambulatory Visit: Payer: Medicare Other | Admitting: Family Medicine

## 2019-09-06 MED ORDER — ACCU-CHEK SOFTCLIX LANCETS MISC: 99 refills | Status: DC

## 2019-09-14 ENCOUNTER — Ambulatory Visit: Payer: Medicare Other | Admitting: Orthopedic Surgery

## 2019-09-17 ENCOUNTER — Ambulatory Visit: Payer: Medicare Other | Admitting: Family Medicine

## 2019-09-21 ENCOUNTER — Other Ambulatory Visit: Payer: Self-pay | Admitting: Family Medicine

## 2019-10-12 ENCOUNTER — Other Ambulatory Visit: Payer: Self-pay | Admitting: Family Medicine

## 2019-10-22 ENCOUNTER — Inpatient Hospital Stay (HOSPITAL_COMMUNITY)
Admission: RE | Admit: 2019-10-22 | Discharge: 2019-10-28 | DRG: 175 | Disposition: A | Discharge status: Skilled Nursing Facility | Payer: Medicare Other | Attending: Family Medicine | Admitting: Family Medicine

## 2019-10-22 ENCOUNTER — Encounter (HOSPITAL_COMMUNITY): Payer: Self-pay | Admitting: Emergency Medicine

## 2019-10-22 ENCOUNTER — Other Ambulatory Visit (HOSPITAL_COMMUNITY): Payer: Self-pay | Admitting: *Deleted

## 2019-10-22 ENCOUNTER — Inpatient Hospital Stay (HOSPITAL_COMMUNITY): Payer: Medicare Other

## 2019-10-22 ENCOUNTER — Other Ambulatory Visit: Payer: Self-pay

## 2019-10-22 ENCOUNTER — Emergency Department (HOSPITAL_COMMUNITY): Payer: Medicare Other

## 2019-10-22 DIAGNOSIS — Z7982 Long term (current) use of aspirin: Secondary | ICD-10-CM

## 2019-10-22 DIAGNOSIS — Z841 Family history of disorders of kidney and ureter: Secondary | ICD-10-CM

## 2019-10-22 DIAGNOSIS — Z6837 Body mass index (BMI) 37.0-37.9, adult: Secondary | ICD-10-CM

## 2019-10-22 DIAGNOSIS — Z7401 Bed confinement status: Secondary | ICD-10-CM | POA: Diagnosis not present

## 2019-10-22 DIAGNOSIS — M129 Arthropathy, unspecified: Secondary | ICD-10-CM | POA: Diagnosis not present

## 2019-10-22 DIAGNOSIS — Z794 Long term (current) use of insulin: Secondary | ICD-10-CM

## 2019-10-22 DIAGNOSIS — I129 Hypertensive chronic kidney disease with stage 1 through stage 4 chronic kidney disease, or unspecified chronic kidney disease: Secondary | ICD-10-CM | POA: Diagnosis not present

## 2019-10-22 DIAGNOSIS — Z20822 Contact with and (suspected) exposure to covid-19: Secondary | ICD-10-CM | POA: Diagnosis present

## 2019-10-22 DIAGNOSIS — E1151 Type 2 diabetes mellitus with diabetic peripheral angiopathy without gangrene: Secondary | ICD-10-CM | POA: Diagnosis present

## 2019-10-22 DIAGNOSIS — R918 Other nonspecific abnormal finding of lung field: Secondary | ICD-10-CM | POA: Diagnosis not present

## 2019-10-22 DIAGNOSIS — J9601 Acute respiratory failure with hypoxia: Secondary | ICD-10-CM | POA: Diagnosis not present

## 2019-10-22 DIAGNOSIS — E1122 Type 2 diabetes mellitus with diabetic chronic kidney disease: Secondary | ICD-10-CM | POA: Diagnosis not present

## 2019-10-22 DIAGNOSIS — I1A0 Resistant hypertension: Secondary | ICD-10-CM | POA: Diagnosis present

## 2019-10-22 DIAGNOSIS — E041 Nontoxic single thyroid nodule: Secondary | ICD-10-CM

## 2019-10-22 DIAGNOSIS — E669 Obesity, unspecified: Secondary | ICD-10-CM | POA: Diagnosis present

## 2019-10-22 DIAGNOSIS — N184 Chronic kidney disease, stage 4 (severe): Secondary | ICD-10-CM | POA: Diagnosis not present

## 2019-10-22 DIAGNOSIS — E118 Type 2 diabetes mellitus with unspecified complications: Secondary | ICD-10-CM | POA: Diagnosis present

## 2019-10-22 DIAGNOSIS — R29898 Other symptoms and signs involving the musculoskeletal system: Secondary | ICD-10-CM | POA: Diagnosis not present

## 2019-10-22 DIAGNOSIS — Z8249 Family history of ischemic heart disease and other diseases of the circulatory system: Secondary | ICD-10-CM | POA: Diagnosis not present

## 2019-10-22 DIAGNOSIS — E1159 Type 2 diabetes mellitus with other circulatory complications: Secondary | ICD-10-CM | POA: Diagnosis not present

## 2019-10-22 DIAGNOSIS — N1832 Chronic kidney disease, stage 3b: Secondary | ICD-10-CM | POA: Diagnosis not present

## 2019-10-22 DIAGNOSIS — K219 Gastro-esophageal reflux disease without esophagitis: Secondary | ICD-10-CM | POA: Diagnosis not present

## 2019-10-22 DIAGNOSIS — I10 Essential (primary) hypertension: Secondary | ICD-10-CM | POA: Diagnosis not present

## 2019-10-22 DIAGNOSIS — E785 Hyperlipidemia, unspecified: Secondary | ICD-10-CM | POA: Diagnosis present

## 2019-10-22 DIAGNOSIS — Z79899 Other long term (current) drug therapy: Secondary | ICD-10-CM

## 2019-10-22 DIAGNOSIS — I2602 Saddle embolus of pulmonary artery with acute cor pulmonale: Secondary | ICD-10-CM

## 2019-10-22 DIAGNOSIS — E119 Type 2 diabetes mellitus without complications: Secondary | ICD-10-CM | POA: Diagnosis present

## 2019-10-22 DIAGNOSIS — I2699 Other pulmonary embolism without acute cor pulmonale: Secondary | ICD-10-CM | POA: Diagnosis not present

## 2019-10-22 DIAGNOSIS — Z743 Need for continuous supervision: Secondary | ICD-10-CM | POA: Diagnosis not present

## 2019-10-22 DIAGNOSIS — Z833 Family history of diabetes mellitus: Secondary | ICD-10-CM | POA: Diagnosis not present

## 2019-10-22 DIAGNOSIS — I2609 Other pulmonary embolism with acute cor pulmonale: Secondary | ICD-10-CM

## 2019-10-22 DIAGNOSIS — M170 Bilateral primary osteoarthritis of knee: Secondary | ICD-10-CM | POA: Diagnosis present

## 2019-10-22 DIAGNOSIS — D631 Anemia in chronic kidney disease: Secondary | ICD-10-CM | POA: Diagnosis not present

## 2019-10-22 DIAGNOSIS — R2689 Other abnormalities of gait and mobility: Secondary | ICD-10-CM | POA: Diagnosis not present

## 2019-10-22 DIAGNOSIS — N183 Chronic kidney disease, stage 3 unspecified: Secondary | ICD-10-CM | POA: Diagnosis present

## 2019-10-22 DIAGNOSIS — E11649 Type 2 diabetes mellitus with hypoglycemia without coma: Secondary | ICD-10-CM | POA: Diagnosis not present

## 2019-10-22 DIAGNOSIS — M6281 Muscle weakness (generalized): Secondary | ICD-10-CM | POA: Diagnosis not present

## 2019-10-22 DIAGNOSIS — R0602 Shortness of breath: Secondary | ICD-10-CM | POA: Diagnosis not present

## 2019-10-22 HISTORY — DX: Acute respiratory failure with hypoxia: J96.01

## 2019-10-22 LAB — CBC WITH DIFFERENTIAL/PLATELET
Abs Immature Granulocytes: 0.03 10*3/uL (ref 0.00–0.07)
Basophils Absolute: 0 10*3/uL (ref 0.0–0.1)
Basophils Relative: 0 %
Eosinophils Absolute: 0.1 10*3/uL (ref 0.0–0.5)
Eosinophils Relative: 2 %
HCT: 37.9 % (ref 36.0–46.0)
Hemoglobin: 11.6 g/dL — ABNORMAL LOW (ref 12.0–15.0)
Immature Granulocytes: 0 %
Lymphocytes Relative: 23 %
Lymphs Abs: 1.8 10*3/uL (ref 0.7–4.0)
MCH: 26.1 pg (ref 26.0–34.0)
MCHC: 30.6 g/dL (ref 30.0–36.0)
MCV: 85.4 fL (ref 80.0–100.0)
Monocytes Absolute: 0.6 10*3/uL (ref 0.1–1.0)
Monocytes Relative: 8 %
Neutro Abs: 5 10*3/uL (ref 1.7–7.7)
Neutrophils Relative %: 67 %
Platelets: 257 10*3/uL (ref 150–400)
RBC: 4.44 MIL/uL (ref 3.87–5.11)
RDW: 14.6 % (ref 11.5–15.5)
WBC: 7.5 10*3/uL (ref 4.0–10.5)
nRBC: 0 % (ref 0.0–0.2)

## 2019-10-22 LAB — D-DIMER, QUANTITATIVE: D-Dimer, Quant: 19.18 ug/mL-FEU — ABNORMAL HIGH (ref 0.00–0.50)

## 2019-10-22 LAB — BASIC METABOLIC PANEL
Anion gap: 11 (ref 5–15)
BUN: 26 mg/dL — ABNORMAL HIGH (ref 8–23)
CO2: 23 mmol/L (ref 22–32)
Calcium: 8.8 mg/dL — ABNORMAL LOW (ref 8.9–10.3)
Chloride: 102 mmol/L (ref 98–111)
Creatinine, Ser: 1.86 mg/dL — ABNORMAL HIGH (ref 0.44–1.00)
GFR calc Af Amer: 31 mL/min — ABNORMAL LOW (ref >60)
GFR calc non Af Amer: 27 mL/min — ABNORMAL LOW (ref >60)
Glucose, Bld: 97 mg/dL (ref 70–99)
Potassium: 4.3 mmol/L (ref 3.5–5.1)
Sodium: 136 mmol/L (ref 135–145)

## 2019-10-22 LAB — TSH: TSH: 1.11 u[IU]/mL (ref 0.350–4.500)

## 2019-10-22 LAB — BRAIN NATRIURETIC PEPTIDE: B Natriuretic Peptide: 159 pg/mL — ABNORMAL HIGH (ref 0.0–100.0)

## 2019-10-22 LAB — SARS CORONAVIRUS 2 BY RT PCR (HOSPITAL ORDER, PERFORMED IN ~~LOC~~ HOSPITAL LAB): SARS Coronavirus 2: NEGATIVE

## 2019-10-22 LAB — HEPARIN LEVEL (UNFRACTIONATED): Heparin Unfractionated: 0.48 IU/mL (ref 0.30–0.70)

## 2019-10-22 LAB — ECHOCARDIOGRAM COMPLETE
Height: 66 in
Weight: 3680 oz

## 2019-10-22 LAB — APTT: aPTT: 28 seconds (ref 24–36)

## 2019-10-22 LAB — TROPONIN I (HIGH SENSITIVITY)
Troponin I (High Sensitivity): 47 ng/L — ABNORMAL HIGH (ref <18)
Troponin I (High Sensitivity): 42 ng/L — ABNORMAL HIGH (ref <18)

## 2019-10-22 LAB — CBG MONITORING, ED: Glucose-Capillary: 189 mg/dL — ABNORMAL HIGH (ref 70–99)

## 2019-10-22 MED ORDER — SENNOSIDES-DOCUSATE SODIUM 8.6-50 MG PO TABS
2.0000 | ORAL_TABLET | Freq: Two times a day (BID) | ORAL | Status: DC
  Administered 2019-10-22 – 2019-10-28 (×12): 2 via ORAL
  Filled 2019-10-22 (×15): qty 2

## 2019-10-22 MED ORDER — INSULIN ASPART 100 UNIT/ML ~~LOC~~ SOLN
4.0000 [IU] | Freq: Three times a day (TID) | SUBCUTANEOUS | Status: DC
  Administered 2019-10-23 – 2019-10-28 (×14): 4 [IU] via SUBCUTANEOUS
  Filled 2019-10-22 (×2): qty 1

## 2019-10-22 MED ORDER — HEPARIN BOLUS VIA INFUSION: 4000.0000 [IU] | Freq: Once | INTRAVENOUS | Status: DC

## 2019-10-22 MED ORDER — IOHEXOL 350 MG/ML SOLN
100.0000 mL | Freq: Once | INTRAVENOUS | Status: AC | PRN
  Administered 2019-10-22: 100 mL via INTRAVENOUS

## 2019-10-22 MED ORDER — DEXTROSE-NACL 5-0.45 % IV SOLN: INTRAVENOUS | Status: DC

## 2019-10-22 MED ORDER — HEPARIN BOLUS VIA INFUSION
5000.0000 [IU] | Freq: Once | INTRAVENOUS | Status: AC
  Administered 2019-10-22: 5000 [IU] via INTRAVENOUS

## 2019-10-22 MED ORDER — MAGNESIUM HYDROXIDE 400 MG/5ML PO SUSP: 30.0000 mL | Freq: Once | ORAL | Status: DC

## 2019-10-22 MED ORDER — INSULIN ASPART 100 UNIT/ML ~~LOC~~ SOLN: 0.0000 [IU] | Freq: Every day | SUBCUTANEOUS | Status: DC

## 2019-10-22 MED ORDER — CLONIDINE HCL 0.1 MG PO TABS
0.1000 mg | ORAL_TABLET | Freq: Four times a day (QID) | ORAL | Status: DC | PRN
  Filled 2019-10-22: qty 1

## 2019-10-22 MED ORDER — INSULIN ASPART 100 UNIT/ML ~~LOC~~ SOLN: 0.0000 [IU] | Freq: Three times a day (TID) | SUBCUTANEOUS | Status: DC

## 2019-10-22 MED ORDER — HEPARIN (PORCINE) 25000 UT/250ML-% IV SOLN
1300.0000 [IU]/h | INTRAVENOUS | Status: DC
  Administered 2019-10-22 – 2019-10-24 (×2): 1300 [IU]/h via INTRAVENOUS
  Filled 2019-10-22 (×5): qty 250

## 2019-10-22 MED ORDER — PANTOPRAZOLE SODIUM 40 MG PO TBEC
40.0000 mg | DELAYED_RELEASE_TABLET | Freq: Every day | ORAL | Status: DC
  Administered 2019-10-22 – 2019-10-28 (×7): 40 mg via ORAL
  Filled 2019-10-22 (×7): qty 1

## 2019-10-22 NOTE — H&P (Signed)
Patient Demographics:    Kathleen [name], is a 69 y.o. female  MRN: [id number]   DOB - 1950-10-13  Admit Date - 10/22/2019  Outpatient Primary MD for the patient is Fayrene Helper, MD   Assessment & Plan:    Principal Problem:   Pulmonary embolism [facility]) Active Problems:   Acute respiratory failure with hypoxia ([place])   Morbid obesity ([place])   CKD (chronic kidney disease) stage 4, GFR 15-29 ml/min (H[facility])   Type 2 diabetes mellitus with vascular disease ([place])   Essential hypertension   GERD    1)Acute Bil PE----patient with large right-sided PE and small left upper lobe PE--- Echo with preserved EF of 65 to 70% with no significant right heart strain on echo -LE venous Dopplers without DVT -Patient may benefit from abdominal pelvic imaging -Patient [facility] and DOE started prior to trip to the beach, symptoms worsened immediately after she arrived at the beach hotel could even walk up from the elevator to her hotel room --This PE may have been present prior to patient's for a trip to the beach symptoms only worsened on that particular day rather than started that day -Patient will need malignancy work-up as part of work-up of a hypercoagulable state -Last colonoscopy more than 10 years ago apparently,  last mammogram 2021 -CTA chest also showed thyroid nodule, thyroid ultrasound and TSH pending -CTA chest also showed 2.7 x 2.1 cm rounded well-defined low density is noted in the left lower lobe --patient will need outpatient PET scan  2)CKD IIIb--Creatinine is 1.86 with recent baseline usually around 1.5-1.6 -patient had contrast/CTA study so hold Aldactone/hctz, hold ramipril --Patient is at risk for worsening renal function due to contrast exposure -- renally adjust medications, avoid nephrotoxic agents /  dehydration  / hypotension  - 3)DM2- A1c 8.3 , reflecting uncontrolled diabetes PTA had hypoglycemic episode in the ED, -PTA was on Lantus 60 mg twice daily, changed to 40 mg daily for now along with sliding scale coverage  4)HTN--okay to restart amlodipine and clonidine but hold ramipril and Aldactone/HCTZ due to kidney concerns -IV hydralazine as needed elevated BP  5)HLD--stable continue pravastatin and aspirin  6) obesity and osteoarthritis----outpatient management  as advised  7) acute hypoxic respiratory failure--- secondary to #1 above, manage as above #1  Status is: Inpatient  Remains inpatient appropriate because:IV treatments appropriate due to intensity of illness or inability to take PO and Inpatient level of care appropriate due to severity of illness   Dispo: The patient is from: Home              Anticipated d/c is to: with HH Vs SNF              Anticipated d/c date is: > 3 days              Patient currently is not medically stable to d/c. Barriers: Not Clinically Stable- -acute hypoxic respiratory failure secondary to PE  requiring IV heparin and oxygen supplementation  With History of - Reviewed by me  Past Medical History:  Diagnosis Date  . Anemia   . Arthritis   . Chronic kidney disease   . Diabetes mellitus type II   . Dysphagia    unspecified   . GERD (gastroesophageal reflux disease)   . Hyperlipidemia 2000  . Hypertension 1995  . Insomnia   . Low back pain   . Obesity       Past Surgical History:  Procedure Laterality Date  . Carpal tunnel release     left   . CHOLECYSTECTOMY  2007  . DIGIT NAIL REMOVAL  08/2011  . KNEE SURGERY  1.20.2012   arthroscopy LEFT knee partial medial meniscectomy  . TENOTOMY     2,3,4  left foot   . toenail removal    . TUBAL LIGATION      Chief Complaint  Patient presents with  . Shortness of Breath      HPI:    Kathleen [name]  is a 69 y.o. female  With PMHx relevant for CKD IIIb, HTN, DM, HLD, GERD,  obesity, OA presents to the ED with worsening shortness of breath, worsening DOE  and was found to be hypoxic in the ED --Patient apparently was having DOE and shob as well as leg pains over last couple of week, took a 4 hr trip to [employer] arriving at [employer] she had a hard time walking from the elevator to her hotel room--- shob  and DOE continue to worsen-- --today in the ED she is found to have hypoxia with elevated D-dimer and tachycardia and CTA chest consistent with large right-sided PE and possible small left upper lobe PE -No frank chest pains no palpitations no dyspnea, specifically denies pleuritic type symptoms -Additional history obtained from her sister bedside -Patient's younger sister who is 2 years younger recently diagnosed with stage IV pancreatic cancer   CTA Chest  IMPRESSION: 1). Large right-sided pulmonary emboli are noted with CT evidence of right heart strain (RV/LV Ratio = 1.0) consistent with at least submassive (intermediate risk) PE. The presence of right heart strain has been associated with an increased risk of morbidity and mortality.  2) 2.6 cm left thyroid nodule is noted. Recommend thyroid US.  3) 2.7 x 2.1 cm rounded well-defined low density is noted in the left lower lobe with average Hounsfield measurement of 40. It is uncertain if this represents possible complex pericardial cyst or mass. PET scan is recommended for further evaluation.  --Troponin is 47, repeat is 42 BNP is 159 hgb 11.6 Creatinine is 1.86 with recent baseline usually around 1.5-1.6 -D-dimer 19.18   Review of systems:    In addition to the HPI above,   A full Review of  Systems was done, all other systems reviewed are negative except as noted above in HPI , .    Social History:  Reviewed by me    Social History   Tobacco Use  . Smoking status: Never Smoker  . Smokeless tobacco: Never Used  Substance Use Topics  . Alcohol use: No     Family History :  Reviewed  by me  -Patient's younger sister who is 2 years younger recently diagnosed with stage IV pancreatic cance   Family History  Problem Relation Age of Onset  . Diabetes Mother   . Hypertension Mother        MI  . Heart disease Mother   . Kidney disease  Mother 84       dialysis  . Diabetes Sister   . Diabetes Brother   . Diabetes Brother   . Kidney disease Brother   . Dementia Sister   . Diabetes Father      Home Medications:   Prior to Admission medications   Medication Sig Start Date End Date Taking? Authorizing Provider  acetaminophen (TYLENOL ARTHRITIS PAIN) 650 MG CR tablet Take 650 mg by mouth 3 (three) times daily as needed. 1-2 by mouth three times a day as needed pain    Yes [provider]  amLODipine (NORVASC) 10 MG tablet TAKE ONE TABLET BY MOUTH ONCE DAILY FOR BLOOD PRESSURE. Patient taking differently: Take 10 mg by mouth daily. FOR BLOOD PRESSURE. 07/31/19  Yes Perlie Mayo, NP  aspirin (ASPIRIN LOW DOSE) 81 MG tablet Take 81 mg by mouth at bedtime.    Yes [provider]  cholecalciferol (VITAMIN D) 1000 UNITS tablet Take 1,000 Units by mouth daily.   Yes [provider]  cloNIDine (CATAPRES) 0.3 MG tablet TAKE (1) TABLET BY MOUTH 3 TIMES DAILY. Patient taking differently: Take 0.3 mg by mouth 3 (three) times daily.  08/09/19  Yes Fayrene Helper, MD  fluticasone (FLONASE) 50 MCG/ACT nasal spray Place 2 sprays into both nostrils daily as needed. Patient taking differently: Place 2 sprays into both nostrils daily as needed for allergies or rhinitis.  02/28/18  Yes Fayrene Helper, MD  glipiZIDE (GLUCOTROL XL) 5 MG 24 hr tablet Take 1 tablet (5 mg total) by mouth daily with breakfast. 05/31/19  Yes Fayrene Helper, MD  LANTUS SOLOSTAR 100 UNIT/ML Solostar Pen INJECT 60 UNITS INTO THE SKIN TWICE DAILY. Patient taking differently: Inject 60 Units into the skin in the morning and at bedtime.  10/12/19  Yes Fayrene Helper, MD    pravastatin (PRAVACHOL) 40 MG tablet Take 1 tablet (40 mg total) by mouth daily. 05/31/19  Yes Fayrene Helper, MD  ramipril (ALTACE) 10 MG capsule TAKE (1) CAPSULE BY MOUTH ONCE DAILY. Patient taking differently: Take 10 mg by mouth daily.  08/09/19  Yes Fayrene Helper, MD  spironolactone-hydrochlorothiazide (ALDACTAZIDE) 25-25 MG tablet TAKE ONE TABLET BY MOUTH ONCE DAILY. Patient taking differently: Take 1 tablet by mouth daily.  10/12/19  Yes Fayrene Helper, MD  sitaGLIPtan (JANUVIA) 100 MG tablet Take 100 mg by mouth daily.    07/22/11  [provider]     Allergies:    No Known Allergies   Physical Exam:   Vitals  Blood pressure (!) 156/84, pulse (!) 103, temperature 97.6 F (36.4 C), temperature source Oral, resp. rate (!) 25, height 5\' 6"  (1.676 m), weight 104.3 kg, SpO2 96 %.  Physical Examination: General appearance - alert, well appearing, and in no distress  Mental status - alert, oriented to person, place, and time,  Eyes - sclera anicteric Nose- [place] 2L/min Neck - supple, no JVD elevation , Chest - clear  to auscultation bilaterally, symmetrical air movement,  Heart - S1 and S2 normal, regular  Abdomen - soft, nontender, nondistended, no masses or organomegaly Neurological - screening mental status exam normal, neck supple without rigidity, cranial nerves II through XII intact, DTR's normal and symmetric Extremities - no pedal edema noted, intact peripheral pulses  Skin - warm, dry     Data Review:    CBC Recent Labs  Lab 10/22/19 1034  WBC 7.5  HGB 11.6*  HCT 37.9  PLT 257  MCV  85.4  MCH 26.1  MCHC 30.6  RDW 14.6  LYMPHSABS 1.8  MONOABS 0.6  EOSABS 0.1  BASOSABS 0.0   ------------------------------------------------------------------------------------------------------------------  Chemistries  Recent Labs  Lab 10/22/19 1034  NA 136  K 4.3  CL 102  CO2 23  GLUCOSE 97  BUN 26*  CREATININE 1.86*  CALCIUM 8.8*    ------------------------------------------------------------------------------------------------------------------ estimated creatinine clearance is 34.3 mL/min (A) (by C-G formula based on SCr of 1.86 mg/dL (H)). ------------------------------------------------------------------------------------------------------------------ No results for input(s): TSH, T4TOTAL, T3FREE, THYROIDAB in the last 72 hours.  Invalid input(s): FREET3   Coagulation profile No results for input(s): INR, PROTIME in the last 168 hours. ------------------------------------------------------------------------------------------------------------------- Recent Labs    10/22/19 1034  DDIMER 19.18*   -------------------------------------------------------------------------------------------------------------------  Cardiac Enzymes No results for input(s): CKMB, TROPONINI, MYOGLOBIN in the last 168 hours.  Invalid input(s): CK ------------------------------------------------------------------------------------------------------------------    Component Value Date/Time   BNP 159.0 (H) 10/22/2019 1034     ---------------------------------------------------------------------------------------------------------------  Urinalysis No results found for: COLORURINE, APPEARANCEUR, LABSPEC, PHURINE, GLUCOSEU, HGBUR, BILIRUBINUR, KETONESUR, PROTEINUR, UROBILINOGEN, NITRITE, LEUKOCYTESUR  ----------------------------------------------------------------------------------------------------------------   Imaging Results:    CT Angio Chest PE W and/or Wo Contrast  Result Date: 10/22/2019 CLINICAL DATA:  Shortness of breath. EXAM: CT ANGIOGRAPHY CHEST WITH CONTRAST TECHNIQUE: Multidetector CT imaging of the chest was performed using the standard protocol during bolus administration of intravenous contrast. Multiplanar CT image reconstructions and MIPs were obtained to evaluate the vascular anatomy. CONTRAST:  124mL  OMNIPAQUE IOHEXOL 350 MG/ML SOLN COMPARISON:  None. FINDINGS: Cardiovascular: Large right-sided pulmonary emboli are noted. RV/LV ratio of 1.0 is noted suggesting right heart strain. Smaller filling defect or embolus is seen in left upper lobe branch. Mild cardiomegaly is noted. No pericardial effusion is noted. Mediastinum/Nodes: No adenopathy is noted. Esophagus is unremarkable. 2.6 cm left thyroid nodule is noted. Lungs/Pleura: No pneumothorax or pleural effusion is noted. Minimal left posterior basilar subsegmental atelectasis is noted. 2.7 x 2.1 cm rounded well-defined low density is noted in the left lower lobe with average Hounsfield measurement of 40. It is uncertain if this represents possible complex pericardial cyst or mass. Upper Abdomen: No acute abnormality. Musculoskeletal: No chest wall abnormality. No acute or significant osseous findings. Review of the MIP images confirms the above findings. IMPRESSION: 1. Large right-sided pulmonary emboli are noted with CT evidence of right heart strain (RV/LV Ratio = 1.0) consistent with at least submassive (intermediate risk) PE. The presence of right heart strain has been associated with an increased risk of morbidity and mortality. Critical Value/emergent results were called by telephone at the time of interpretation on 10/22/2019 at 1:44 pm to provider Dr. Kathrynn Humble, Who verbally acknowledged these results. 2. 2.6 cm left thyroid nodule is noted. Recommend thyroid US. (Ref: J Am Coll Radiol. 2015 Feb;12(2): 143-50). 3. 2.7 x 2.1 cm rounded well-defined low density is noted in the left lower lobe with average Hounsfield measurement of 40. It is uncertain if this represents possible complex pericardial cyst or mass. PET scan is recommended for further evaluation. Electronically Signed   By: Marijo Conception M.D.   On: 10/22/2019 13:45   US Venous Img Lower Bilateral (DVT)  Result Date: 10/22/2019 CLINICAL DATA:  Pulmonary embolism EXAM: BILATERAL LOWER  EXTREMITY VENOUS DOPPLER ULTRASOUND TECHNIQUE: Gray-scale sonography with graded compression, as well as color Doppler and duplex ultrasound were performed to evaluate the lower extremity deep venous systems from the level of the common femoral vein and including the common femoral, femoral, profunda femoral, popliteal and calf  veins including the posterior tibial, peroneal and gastrocnemius veins when visible. The superficial great saphenous vein was also interrogated. Spectral Doppler was utilized to evaluate flow at rest and with distal augmentation maneuvers in the common femoral, femoral and popliteal veins. COMPARISON:  None. FINDINGS: RIGHT LOWER EXTREMITY Common Femoral Vein: No evidence of thrombus. Normal compressibility, respiratory phasicity and response to augmentation. Saphenofemoral Junction: No evidence of thrombus. Normal compressibility and flow on color Doppler imaging. Profunda Femoral Vein: No evidence of thrombus. Normal compressibility and flow on color Doppler imaging. Femoral Vein: No evidence of thrombus. Normal compressibility, respiratory phasicity and response to augmentation. Popliteal Vein: No evidence of thrombus. Normal compressibility, respiratory phasicity and response to augmentation. Calf Veins: No evidence of thrombus. Normal compressibility and flow on color Doppler imaging. Superficial Great Saphenous Vein: No evidence of thrombus. Normal compressibility. Venous Reflux:  None. Other Findings:  None. LEFT LOWER EXTREMITY Common Femoral Vein: No evidence of thrombus. Normal compressibility, respiratory phasicity and response to augmentation. Saphenofemoral Junction: No evidence of thrombus. Normal compressibility and flow on color Doppler imaging. Profunda Femoral Vein: No evidence of thrombus. Normal compressibility and flow on color Doppler imaging. Femoral Vein: No evidence of thrombus. Normal compressibility, respiratory phasicity and response to augmentation. Popliteal  Vein: No evidence of thrombus. Normal compressibility, respiratory phasicity and response to augmentation. Calf Veins: No evidence of thrombus. Normal compressibility and flow on color Doppler imaging. Superficial Great Saphenous Vein: No evidence of thrombus. Normal compressibility. Venous Reflux:  None. Other Findings:  None. IMPRESSION: No evidence of deep venous thrombosis in either lower extremity. Electronically Signed   By: Franchot Gallo M.D.   On: 10/22/2019 15:53   DG Chest Port 1 View  Result Date: 10/22/2019 CLINICAL DATA:  Shortness of breath for 5 days. EXAM: PORTABLE CHEST 1 VIEW COMPARISON:  Radiographs 05/26/2011 and 03/15/2008. FINDINGS: 1042 hours. The heart size is stable at the upper limits of normal for portable AP technique. The mediastinal contours are normal. The pulmonary vascularity is normal, and the lungs remain clear. There is no pleural effusion or pneumothorax. No acute osseous findings are seen. There are degenerative changes throughout the spine and at both shoulders. Telemetry leads overlie the chest. IMPRESSION: No active cardiopulmonary process. Electronically Signed   By: Richardean Sale M.D.   On: 10/22/2019 10:50   ECHOCARDIOGRAM COMPLETE  Result Date: 10/22/2019    ECHOCARDIOGRAM REPORT   Patient Name:   KAELAH HAYASHI [facility] Date of Exam: 10/22/2019 Medical Rec #:  [id number]       Height:       66.0 in Accession #:    [id number]      Weight:       230.0 lb Date of Birth:  Sep 07, 1950      BSA:          2.122 m Patient Age:    16 years        BP:           150/84 mmHg Patient Gender: F               HR:           101 bpm. Exam Location:  [facility] Procedure: 2D Echo, Cardiac Doppler and Color Doppler Indications:    Pulmonary Embolus 415.19 / I26.99  History:        Patient has no prior history of Echocardiogram examinations.                 Risk Factors:Diabetes, Dyslipidemia  and Hypertension. Acute                 respiratory failure with hypoxia,CKD (chronic kidney  disease)                 stage 4,Morbid obesity,Pulmonary embolism.  Sonographer:    Alvino Chapel RCS Referring Phys: [phone] Elif Yonts IMPRESSIONS  1. Left ventricular ejection fraction, by estimation, is 65 to 70%. The left ventricle has normal function. The left ventricle has no regional wall motion abnormalities. There is mild left ventricular hypertrophy.  2. Right ventricular systolic function is mildly reduced. The right ventricular size is mildly enlarged.  3. Right atrial size was mildly dilated.  4. The mitral valve is normal in structure. No evidence of mitral valve regurgitation.  5. The aortic valve is tricuspid. Aortic valve regurgitation is not visualized. Mild aortic valve sclerosis is present, with no evidence of aortic valve stenosis.  6. The inferior vena cava is normal in size with greater than 50% respiratory variability, suggesting right atrial pressure of 3 mmHg. FINDINGS  Left Ventricle: Left ventricular ejection fraction, by estimation, is 65 to 70%. The left ventricle has normal function. The left ventricle has no regional wall motion abnormalities. The left ventricular internal cavity size was normal in size. There is  mild left ventricular hypertrophy. Left ventricular diastolic function could not be evaluated. Right Ventricle: The right ventricular size is mildly enlarged. Right vetricular wall thickness was not assessed. Right ventricular systolic function is mildly reduced. Left Atrium: Left atrial size was normal in size. Right Atrium: Right atrial size was mildly dilated. Pericardium: There is no evidence of pericardial effusion. Mitral Valve: The mitral valve is normal in structure. No evidence of mitral valve regurgitation. Tricuspid Valve: The tricuspid valve is normal in structure. Tricuspid valve regurgitation is trivial. Aortic Valve: The aortic valve is tricuspid. Aortic valve regurgitation is not visualized. Mild aortic valve sclerosis is present, with no evidence of  aortic valve stenosis. Pulmonic Valve: The pulmonic valve was grossly normal. Pulmonic valve regurgitation is not visualized. Aorta: The aortic root is normal in size and structure. Venous: The inferior vena cava is normal in size with greater than 50% respiratory variability, suggesting right atrial pressure of 3 mmHg. IAS/Shunts: No atrial level shunt detected by color flow Doppler.  LEFT VENTRICLE PLAX 2D LVIDd:         3.43 cm  Diastology LVIDs:         2.14 cm  LV e' lateral:   7.62 cm/s LV PW:         1.15 cm  LV E/e' lateral: 10.2 LV IVS:        1.31 cm  LV e' medial:    6.74 cm/s LVOT diam:     1.90 cm  LV E/e' medial:  11.6 LV SV:         48 LV SV Index:   23 LVOT Area:     2.84 cm  RIGHT VENTRICLE RV S prime:     13.20 cm/s TAPSE (M-mode): 1.9 cm LEFT ATRIUM             Index       RIGHT ATRIUM           Index LA diam:        2.60 cm 1.23 cm/m  RA Area:     13.90 cm LA Vol (A2C):   30.0 ml 14.14 ml/m RA Volume:   29.80 ml  14.04 ml/m LA  Vol (A4C):   29.8 ml 14.04 ml/m LA Biplane Vol: 29.8 ml 14.04 ml/m  AORTIC VALVE LVOT Vmax:   128.00 cm/s LVOT Vmean:  78.900 cm/s LVOT VTI:    0.171 m  AORTA Ao Root diam: 3.10 cm MITRAL VALVE MV Area (PHT): 2.54 cm     SHUNTS MV Decel Time: 299 msec     Systemic VTI:  0.17 m MV E velocity: 78.10 cm/s   Systemic Diam: 1.90 cm MV A velocity: 108.00 cm/s MV E/A ratio:  0.72 Dorris Carnes MD Electronically signed by Dorris Carnes MD Signature Date/Time: 10/22/2019/4:36:41 PM    Final     Radiological Exams on Admission: CT Angio Chest PE W and/or Wo Contrast  Result Date: 10/22/2019 CLINICAL DATA:  Shortness of breath. EXAM: CT ANGIOGRAPHY CHEST WITH CONTRAST TECHNIQUE: Multidetector CT imaging of the chest was performed using the standard protocol during bolus administration of intravenous contrast. Multiplanar CT image reconstructions and MIPs were obtained to evaluate the vascular anatomy. CONTRAST:  172mL OMNIPAQUE IOHEXOL 350 MG/ML SOLN COMPARISON:  None.  FINDINGS: Cardiovascular: Large right-sided pulmonary emboli are noted. RV/LV ratio of 1.0 is noted suggesting right heart strain. Smaller filling defect or embolus is seen in left upper lobe branch. Mild cardiomegaly is noted. No pericardial effusion is noted. Mediastinum/Nodes: No adenopathy is noted. Esophagus is unremarkable. 2.6 cm left thyroid nodule is noted. Lungs/Pleura: No pneumothorax or pleural effusion is noted. Minimal left posterior basilar subsegmental atelectasis is noted. 2.7 x 2.1 cm rounded well-defined low density is noted in the left lower lobe with average Hounsfield measurement of 40. It is uncertain if this represents possible complex pericardial cyst or mass. Upper Abdomen: No acute abnormality. Musculoskeletal: No chest wall abnormality. No acute or significant osseous findings. Review of the MIP images confirms the above findings. IMPRESSION: 1. Large right-sided pulmonary emboli are noted with CT evidence of right heart strain (RV/LV Ratio = 1.0) consistent with at least submassive (intermediate risk) PE. The presence of right heart strain has been associated with an increased risk of morbidity and mortality. Critical Value/emergent results were called by telephone at the time of interpretation on 10/22/2019 at 1:44 pm to provider Dr. Kathrynn Humble, Who verbally acknowledged these results. 2. 2.6 cm left thyroid nodule is noted. Recommend thyroid US. (Ref: J Am Coll Radiol. 2015 Feb;12(2): 143-50). 3. 2.7 x 2.1 cm rounded well-defined low density is noted in the left lower lobe with average Hounsfield measurement of 40. It is uncertain if this represents possible complex pericardial cyst or mass. PET scan is recommended for further evaluation. Electronically Signed   By: Marijo Conception M.D.   On: 10/22/2019 13:45   US Venous Img Lower Bilateral (DVT)  Result Date: 10/22/2019 CLINICAL DATA:  Pulmonary embolism EXAM: BILATERAL LOWER EXTREMITY VENOUS DOPPLER ULTRASOUND TECHNIQUE: Gray-scale  sonography with graded compression, as well as color Doppler and duplex ultrasound were performed to evaluate the lower extremity deep venous systems from the level of the common femoral vein and including the common femoral, femoral, profunda femoral, popliteal and calf veins including the posterior tibial, peroneal and gastrocnemius veins when visible. The superficial great saphenous vein was also interrogated. Spectral Doppler was utilized to evaluate flow at rest and with distal augmentation maneuvers in the common femoral, femoral and popliteal veins. COMPARISON:  None. FINDINGS: RIGHT LOWER EXTREMITY Common Femoral Vein: No evidence of thrombus. Normal compressibility, respiratory phasicity and response to augmentation. Saphenofemoral Junction: No evidence of thrombus. Normal compressibility and flow on color Doppler  imaging. Profunda Femoral Vein: No evidence of thrombus. Normal compressibility and flow on color Doppler imaging. Femoral Vein: No evidence of thrombus. Normal compressibility, respiratory phasicity and response to augmentation. Popliteal Vein: No evidence of thrombus. Normal compressibility, respiratory phasicity and response to augmentation. Calf Veins: No evidence of thrombus. Normal compressibility and flow on color Doppler imaging. Superficial Great Saphenous Vein: No evidence of thrombus. Normal compressibility. Venous Reflux:  None. Other Findings:  None. LEFT LOWER EXTREMITY Common Femoral Vein: No evidence of thrombus. Normal compressibility, respiratory phasicity and response to augmentation. Saphenofemoral Junction: No evidence of thrombus. Normal compressibility and flow on color Doppler imaging. Profunda Femoral Vein: No evidence of thrombus. Normal compressibility and flow on color Doppler imaging. Femoral Vein: No evidence of thrombus. Normal compressibility, respiratory phasicity and response to augmentation. Popliteal Vein: No evidence of thrombus. Normal compressibility,  respiratory phasicity and response to augmentation. Calf Veins: No evidence of thrombus. Normal compressibility and flow on color Doppler imaging. Superficial Great Saphenous Vein: No evidence of thrombus. Normal compressibility. Venous Reflux:  None. Other Findings:  None. IMPRESSION: No evidence of deep venous thrombosis in either lower extremity. Electronically Signed   By: Franchot Gallo M.D.   On: 10/22/2019 15:53   DG Chest Port 1 View  Result Date: 10/22/2019 CLINICAL DATA:  Shortness of breath for 5 days. EXAM: PORTABLE CHEST 1 VIEW COMPARISON:  Radiographs 05/26/2011 and 03/15/2008. FINDINGS: 1042 hours. The heart size is stable at the upper limits of normal for portable AP technique. The mediastinal contours are normal. The pulmonary vascularity is normal, and the lungs remain clear. There is no pleural effusion or pneumothorax. No acute osseous findings are seen. There are degenerative changes throughout the spine and at both shoulders. Telemetry leads overlie the chest. IMPRESSION: No active cardiopulmonary process. Electronically Signed   By: Richardean Sale M.D.   On: 10/22/2019 10:50   ECHOCARDIOGRAM COMPLETE  Result Date: 10/22/2019    ECHOCARDIOGRAM REPORT   Patient Name:   MANDOLIN FALWELL [facility] Date of Exam: 10/22/2019 Medical Rec #:  [id number]       Height:       66.0 in Accession #:    [id number]      Weight:       230.0 lb Date of Birth:  [birth year]      BSA:          2.122 m Patient Age:    45 years        BP:           150/84 mmHg Patient Gender: F               HR:           101 bpm. Exam Location:  [facility] Procedure: 2D Echo, Cardiac Doppler and Color Doppler Indications:    Pulmonary Embolus 415.19 / I26.99  History:        Patient has no prior history of Echocardiogram examinations.                 Risk Factors:Diabetes, Dyslipidemia and Hypertension. Acute                 respiratory failure with hypoxia,CKD (chronic kidney disease)                 stage 4,Morbid obesity,Pulmonary  embolism.  Sonographer:    Alvino Chapel RCS Referring Phys: [phone] Daysean Tinkham IMPRESSIONS  1. Left ventricular ejection fraction, by estimation, is 65 to 70%. The left ventricle  has normal function. The left ventricle has no regional wall motion abnormalities. There is mild left ventricular hypertrophy.  2. Right ventricular systolic function is mildly reduced. The right ventricular size is mildly enlarged.  3. Right atrial size was mildly dilated.  4. The mitral valve is normal in structure. No evidence of mitral valve regurgitation.  5. The aortic valve is tricuspid. Aortic valve regurgitation is not visualized. Mild aortic valve sclerosis is present, with no evidence of aortic valve stenosis.  6. The inferior vena cava is normal in size with greater than 50% respiratory variability, suggesting right atrial pressure of 3 mmHg. FINDINGS  Left Ventricle: Left ventricular ejection fraction, by estimation, is 65 to 70%. The left ventricle has normal function. The left ventricle has no regional wall motion abnormalities. The left ventricular internal cavity size was normal in size. There is  mild left ventricular hypertrophy. Left ventricular diastolic function could not be evaluated. Right Ventricle: The right ventricular size is mildly enlarged. Right vetricular wall thickness was not assessed. Right ventricular systolic function is mildly reduced. Left Atrium: Left atrial size was normal in size. Right Atrium: Right atrial size was mildly dilated. Pericardium: There is no evidence of pericardial effusion. Mitral Valve: The mitral valve is normal in structure. No evidence of mitral valve regurgitation. Tricuspid Valve: The tricuspid valve is normal in structure. Tricuspid valve regurgitation is trivial. Aortic Valve: The aortic valve is tricuspid. Aortic valve regurgitation is not visualized. Mild aortic valve sclerosis is present, with no evidence of aortic valve stenosis. Pulmonic Valve: The pulmonic valve  was grossly normal. Pulmonic valve regurgitation is not visualized. Aorta: The aortic root is normal in size and structure. Venous: The inferior vena cava is normal in size with greater than 50% respiratory variability, suggesting right atrial pressure of 3 mmHg. IAS/Shunts: No atrial level shunt detected by color flow Doppler.  LEFT VENTRICLE PLAX 2D LVIDd:         3.43 cm  Diastology LVIDs:         2.14 cm  LV e' lateral:   7.62 cm/s LV PW:         1.15 cm  LV E/e' lateral: 10.2 LV IVS:        1.31 cm  LV e' medial:    6.74 cm/s LVOT diam:     1.90 cm  LV E/e' medial:  11.6 LV SV:         48 LV SV Index:   23 LVOT Area:     2.84 cm  RIGHT VENTRICLE RV S prime:     13.20 cm/s TAPSE (M-mode): 1.9 cm LEFT ATRIUM             Index       RIGHT ATRIUM           Index LA diam:        2.60 cm 1.23 cm/m  RA Area:     13.90 cm LA Vol (A2C):   30.0 ml 14.14 ml/m RA Volume:   29.80 ml  14.04 ml/m LA Vol (A4C):   29.8 ml 14.04 ml/m LA Biplane Vol: 29.8 ml 14.04 ml/m  AORTIC VALVE LVOT Vmax:   128.00 cm/s LVOT Vmean:  78.900 cm/s LVOT VTI:    0.171 m  AORTA Ao Root diam: 3.10 cm MITRAL VALVE MV Area (PHT): 2.54 cm     SHUNTS MV Decel Time: 299 msec     Systemic VTI:  0.17 m MV E velocity: 78.10 cm/s  Systemic Diam: 1.90 cm MV A velocity: 108.00 cm/s MV E/A ratio:  0.72 Dorris Carnes MD Electronically signed by Dorris Carnes MD Signature Date/Time: 10/22/2019/4:36:41 PM    Final     DVT Prophylaxis -iv heparin AM Labs Ordered, also please review Full Orders  Family Communication: Admission, patients condition and plan of care including tests being ordered have been discussed with the patient and sister who indicate understanding and agree with the plan   Code Status - Full Code  Likely DC to  Home with home health after resolution of dyspnea on exertion and improvement in hypoxia  Condition   stable  Roxan Hockey M.D on 10/22/2019 at 6:56 PM Go to [email or web address] -  for contact info  [facility] -  Office  [phone]

## 2019-10-22 NOTE — Progress Notes (Signed)
*  PRELIMINARY RESULTS* Echocardiogram 2D Echocardiogram has been performed.  Kathleen [name] 10/22/2019, 4:24 PM

## 2019-10-22 NOTE — ED Notes (Signed)
PT given gingerale and crackers with peanut butter at this time.

## 2019-10-22 NOTE — ED Notes (Addendum)
PT called out and expressed she felt like her blood sugar was low. CBG 61. EDP made aware.

## 2019-10-22 NOTE — ED Triage Notes (Signed)
Pt c/o SOB with minimal to no exertion since Wed after a beach trip, denies cough and fevers, no history of asthma, COPD or CHF,  O2 sats in triage 89%, placed on 2L [place] increased to 98%

## 2019-10-22 NOTE — Consult Note (Signed)
NAME:  Kathleen [name], MRN:  [id number], DOB:  27-Jun-1950, LOS: 0 ADMISSION DATE:  10/22/2019, CONSULTATION DATE:  6/14 REFERRING MD:  [facility], CHIEF COMPLAINT:  Sob    Brief History   51 yobf  Never smoker With chronic leg swelling much worse on R since around 6/9 and worse sob as well presented to [facility] am 6/14 with w/u pos for extensive PE with some R ht strain so PCCM service asked to consult  History of present illness   Kathleen [name] is a 69 y.o. female with a history of diabetes, hypertension, hyperlipidemia and chronic kidney disease, also recently diagnosed with arthritis in her bilateral knees presenting with shortness of breath with exertion which started 5 days PTA shortly after arriving at the beach for vacation with her family.  She rode to the beach, and noticed shortness of breath with any significant ambulation which is a new symptom plus  pain at her right medial knee which is consistent with her arthritis diagnosis.  She states walking from her bed to the bathroom causes shortness of breath, having to lie down and rest afterwards.  She denies any Covid exposures, but has been fully vaccinated for this infection.    Past Medical History  DM HBP on ACEi  CRI with baseline creat  1.6  DJD   Significant Hospital Events   Admit 6/14   Consults:  PCCM  Procedures:    Significant Diagnostic Tests:  CTa 6/14: 1. Large right-sided pulmonary emboli are noted with CT evidence of right heart strain (RV/LV Ratio = 1.0) consistent with at least submassive (intermediate risk) PE. The presence of right heart strain has been associated with an increased risk of morbidity and mortality. Critical Value/emergent results were called by telephone at the time of interpretation on 10/22/2019 at 1:44 pm to provider Dr. Kathrynn Humble, Who verbally acknowledged these results. 2. 2.6 cm left thyroid nodule is noted. Recommend thyroid US. (Ref: J Am Coll Radiol. 2015 Feb;12(2): 143-50). 3. 2.7  x 2.1 cm rounded well-defined low density is noted in the left lower lobe with average Hounsfield measurement of 40. It is uncertain if this represents possible complex pericardial cyst or mass. PET scan is recommended for further evaluation. Venous dopplers 6/14:   Micro Data:  Covid 19 PCR  6/14  Neg   Antimicrobials:    Scheduled Meds: . magnesium hydroxide  30 mL Oral Once  . senna-docusate  2 tablet Oral BID   Continuous Infusions: . heparin 1,300 Units/hr (10/22/19 1534)   PRN Meds:.    Interim history/subjective:  comforable on NP at 30 degrees HOB   Objective   Blood pressure (!) 150/84, pulse (!) 101, temperature 97.6 F (36.4 C), temperature source Oral, resp. rate (!) 22, height 5' 6"  (1.676 m), weight 104.3 kg, SpO2 90 %.       No intake or output data in the 24 hours ending 10/22/19 1542 Filed Weights   10/22/19 0952  Weight: 104.3 kg    Examination: General: pleasan somewhat anxious elderly bf nad  Lungs: clear to and P  Cardiovascular: slt increased P2 no heave  Abdomen: obese,  Soft/ benign Extremities: slt swelling R  > L amd temer R ca;f Neuro: intact sensorium   Resolved Hospital Problem list      Assessment & Plan:  1) acute PE in setting  Of probable R DVT ? Chronicity s circulatory shock or refractory hypoxemia at present with no underlying h/o or findings for chronic lung  dz or prior right heart issues so should do ok with just full dose IV Hep  >>> SBR until theapeutic (aim high thepeutic) levels have been achieved then ok to change to DOAC x at least 6 m rx before reassess risk/benefits of long term anticoagulation.  2) Acute hypoxemic resp failure secondary to PE >>> titrate to sats > 95% to help with afterload reduction on the R ht  3)  CRI on acei now s/p IV contrast > keep hydrated, hold acei, monitor UOP closeely      Labs   CBC: Recent Labs  Lab 10/22/19 1034  WBC 7.5  NEUTROABS 5.0  HGB 11.6*  HCT 37.9  MCV 85.4  PLT  270    Basic Metabolic Panel: Recent Labs  Lab 10/22/19 1034  NA 136  K 4.3  CL 102  CO2 23  GLUCOSE 97  BUN 26*  CREATININE 1.86*  CALCIUM 8.8*   GFR: Estimated Creatinine Clearance: 34.3 mL/min (A) (by C-G formula based on SCr of 1.86 mg/dL (H)). Recent Labs  Lab 10/22/19 1034  WBC 7.5    Liver Function Tests: No results for input(s): AST, ALT, ALKPHOS, BILITOT, PROT, ALBUMIN in the last 168 hours. No results for input(s): LIPASE, AMYLASE in the last 168 hours. No results for input(s): AMMONIA in the last 168 hours.  ABG No results found for: PHART, PCO2ART, PO2ART, HCO3, TCO2, ACIDBASEDEF, O2SAT   Coagulation Profile: No results for input(s): INR, PROTIME in the last 168 hours.  Cardiac Enzymes: No results for input(s): CKTOTAL, CKMB, CKMBINDEX, TROPONINI in the last 168 hours.  HbA1C: Hgb A1c MFr Bld  Date/Time Value Ref Range Status  05/24/2019 10:48 AM 8.3 (H) <5.7 % of total Hgb Final    Comment:    For someone without known diabetes, a hemoglobin A1c value of 6.5% or greater indicates that they may have  diabetes and this should be confirmed with a follow-up  test. . For someone with known diabetes, a value <7% indicates  that their diabetes is well controlled and a value  greater than or equal to 7% indicates suboptimal  control. A1c targets should be individualized based on  duration of diabetes, age, comorbid conditions, and  other considerations. . Currently, no consensus exists regarding use of hemoglobin A1c for diagnosis of diabetes for children. Marland Kitchen   02/02/2019 10:42 AM 7.7 (H) <5.7 % of total Hgb Final    Comment:    For someone without known diabetes, a hemoglobin A1c value of 6.5% or greater indicates that they may have  diabetes and this should be confirmed with a follow-up  test. . For someone with known diabetes, a value <7% indicates  that their diabetes is well controlled and a value  greater than or equal to 7% indicates  suboptimal  control. A1c targets should be individualized based on  duration of diabetes, age, comorbid conditions, and  other considerations. . Currently, no consensus exists regarding use of hemoglobin A1c for diagnosis of diabetes for children. .     CBG: No results for input(s): GLUCAP in the last 168 hours.     Past Medical History  She,  has a past medical history of Anemia, Arthritis, Chronic kidney disease, Diabetes mellitus type II, Dysphagia, GERD (gastroesophageal reflux disease), Hyperlipidemia (2000), Hypertension (1995), Insomnia, Low back pain, and Obesity.   Surgical History    Past Surgical History:  Procedure Laterality Date  . Carpal tunnel release     left   . CHOLECYSTECTOMY  2007  . DIGIT NAIL REMOVAL  08/2011  . KNEE SURGERY  1.20.2012   arthroscopy LEFT knee partial medial meniscectomy  . TENOTOMY     2,3,4  left foot   . toenail removal    . TUBAL LIGATION       Social History   reports that she has never smoked. She has never used smokeless tobacco. She reports that she does not drink alcohol and does not use drugs.   Family History   Her family history includes Dementia in her sister; Diabetes in her brother, brother, father, mother, and sister; Heart disease in her mother; Hypertension in her mother; Kidney disease in her brother; Kidney disease (age of onset: 69) in her mother.   Allergies No Known Allergies   Home Medications  Prior to Admission medications   Medication Sig Start Date End Date Taking? Authorizing Provider  amLODipine (NORVASC) 10 MG tablet TAKE ONE TABLET BY MOUTH ONCE DAILY FOR BLOOD PRESSURE. 07/31/19  Yes Perlie Mayo, NP  blood glucose meter kit and supplies Test three times daily dx e11.65 accuchek aviva plus if covered 12/27/17  Yes Fayrene Helper, MD  blood glucose meter kit and supplies Dispense based on patient and insurance preference.Use to test blood sugars three times daily. (FOR ICD-10 E10.9, E11.9).  05/21/19  Yes Fayrene Helper, MD  cloNIDine (CATAPRES) 0.3 MG tablet TAKE (1) TABLET BY MOUTH 3 TIMES DAILY. 08/09/19  Yes Fayrene Helper, MD  glipiZIDE (GLUCOTROL XL) 5 MG 24 hr tablet Take 1 tablet (5 mg total) by mouth daily with breakfast. 05/31/19  Yes Fayrene Helper, MD  Insulin Pen Needle (B-D ULTRAFINE III SHORT PEN) 31G X 8 MM MISC USE DAILY WITH LANTUS. 12/07/18  Yes Fayrene Helper, MD  LANTUS SOLOSTAR 100 UNIT/ML Solostar Pen INJECT 60 UNITS INTO THE SKIN TWICE DAILY. 10/12/19  Yes Fayrene Helper, MD  pravastatin (PRAVACHOL) 40 MG tablet Take 1 tablet (40 mg total) by mouth daily. 05/31/19  Yes Fayrene Helper, MD  ramipril (ALTACE) 10 MG capsule TAKE (1) CAPSULE BY MOUTH ONCE DAILY. 08/09/19  Yes Fayrene Helper, MD  spironolactone-hydrochlorothiazide (ALDACTAZIDE) 25-25 MG tablet TAKE ONE TABLET BY MOUTH ONCE DAILY. 10/12/19  Yes Fayrene Helper, MD  ACCU-CHEK GUIDE test strip USE TO TEST THREE TIMES DAILY. 09/24/19   Fayrene Helper, MD  Accu-Chek Softclix Lancets lancets USE TO TEST 3 TIMES DAILY. 09/06/19   Fayrene Helper, MD  acetaminophen (TYLENOL ARTHRITIS PAIN) 650 MG CR tablet Take 650 mg by mouth 3 (three) times daily as needed. 1-2 by mouth three times a day as needed pain     [provider]  aspirin (ASPIRIN LOW DOSE) 81 MG tablet Take 81 mg by mouth at bedtime.     [provider]  cholecalciferol (VITAMIN D) 1000 UNITS tablet Take 1,000 Units by mouth daily.    [provider]  fluticasone (FLONASE) 50 MCG/ACT nasal spray Place 2 sprays into both nostrils daily as needed. 02/28/18   Fayrene Helper, MD  sitaGLIPtan (JANUVIA) 100 MG tablet Take 100 mg by mouth daily.    07/22/11  [provider]         .The patient is critically ill with multiple organ systems failure and requires high complexity decision making for assessment and support, frequent evaluation and titration of therapies, application of  advanced monitoring technologies and extensive interpretation of multiple databases. Critical Care Time devoted to patient care services  described in this note is 40 minutes.    Christinia Gully, MD [facility] [place] [phone] After 6:00 PM or weekends, use [facility] [phone]  After 7:00 pm call Elink  [phone]

## 2019-10-22 NOTE — ED Notes (Signed)
Called ECHO Tech as requested by RN from Hospitalist, [place] to see is she can be done as soon as possible.

## 2019-10-22 NOTE — ED Provider Notes (Addendum)
[facility] EMERGENCY DEPARTMENT Provider Note   CSN: [id number] Arrival date & time: 10/22/19  4098     History Chief Complaint  Patient presents with  . Shortness of Breath    Kathleen [name] is a 69 y.o. female with a history of diabetes, hypertension, hyperlipidemia and chronic kidney disease, also recently diagnosed with arthritis in her bilateral knees presenting with shortness of breath with exertion which started 5 days ago shortly after arriving at the beach for vacation with her family.  She rode to the beach, and noticed shortness of breath with any significant ambulation which is a new symptom.  She denies cough, chest pain, fevers or chills.  She denies symptoms at rest.  Also denies orthopnea, peripheral edema or leg pain, although does endorse pain at her right medial knee which is consistent with her arthritis diagnosis.  She states walking from her bed to the bathroom causes shortness of breath, having to lie down and rest afterwards.  She denies any Covid exposures, but has been fully vaccinated for this infection.  Denies palpitations, chest pain, nausea or vomiting, also denies pain in her jaw neck shoulders or back.  She does not smoke cigarettes.  She has had no treatment for this complaint prior to arrival.  HPI     Past Medical History:  Diagnosis Date  . Anemia   . Arthritis   . Chronic kidney disease   . Diabetes mellitus type II   . Dysphagia    unspecified   . GERD (gastroesophageal reflux disease)   . Hyperlipidemia 2000  . Hypertension 1995  . Insomnia   . Low back pain   . Obesity     Patient Active Problem List   Diagnosis Date Noted  . Pulmonary embolism ([place]) 10/22/2019  . Acute respiratory failure with hypoxia ([place]) 10/22/2019  . Finger deformity, acquired 04/03/2018  . Knee pain, right 03/19/2018  . CKD (chronic kidney disease) stage 4, GFR 15-29 ml/min (HCC) 03/19/2018  . Seasonal allergies 09/17/2013  . Osteoarthritis of left knee  01/11/2012  . Morbid obesity ([place]) 02/03/2009  . Hyperlipidemia LDL goal <100 11/24/2007  . Type 2 diabetes mellitus with vascular disease ([place]) 06/09/2007  . Essential hypertension 04/26/2007  . GERD 04/26/2007    Past Surgical History:  Procedure Laterality Date  . Carpal tunnel release     left   . CHOLECYSTECTOMY  2007  . DIGIT NAIL REMOVAL  08/2011  . KNEE SURGERY  1.20.2012   arthroscopy LEFT knee partial medial meniscectomy  . TENOTOMY     2,3,4  left foot   . toenail removal    . TUBAL LIGATION       OB History   No obstetric history on file.     Family History  Problem Relation Age of Onset  . Diabetes Mother   . Hypertension Mother        MI  . Heart disease Mother   . Kidney disease Mother 8       dialysis  . Diabetes Sister   . Diabetes Brother   . Diabetes Brother   . Kidney disease Brother   . Dementia Sister   . Diabetes Father     Social History   Tobacco Use  . Smoking status: Never Smoker  . Smokeless tobacco: Never Used  Vaping Use  . Vaping Use: Never used  Substance Use Topics  . Alcohol use: No  . Drug use: No    Home Medications Prior to  Admission medications   Medication Sig Start Date End Date Taking? Authorizing Provider  amLODipine (NORVASC) 10 MG tablet TAKE ONE TABLET BY MOUTH ONCE DAILY FOR BLOOD PRESSURE. 07/31/19  Yes Perlie Mayo, NP  blood glucose meter kit and supplies Test three times daily dx e11.65 accuchek aviva plus if covered 12/27/17  Yes Fayrene Helper, MD  blood glucose meter kit and supplies Dispense based on patient and insurance preference.Use to test blood sugars three times daily. (FOR ICD-10 E10.9, E11.9). 05/21/19  Yes Fayrene Helper, MD  cloNIDine (CATAPRES) 0.3 MG tablet TAKE (1) TABLET BY MOUTH 3 TIMES DAILY. 08/09/19  Yes Fayrene Helper, MD  glipiZIDE (GLUCOTROL XL) 5 MG 24 hr tablet Take 1 tablet (5 mg total) by mouth daily with breakfast. 05/31/19  Yes Fayrene Helper, MD  Insulin  Pen Needle (B-D ULTRAFINE III SHORT PEN) 31G X 8 MM MISC USE DAILY WITH LANTUS. 12/07/18  Yes Fayrene Helper, MD  LANTUS SOLOSTAR 100 UNIT/ML Solostar Pen INJECT 60 UNITS INTO THE SKIN TWICE DAILY. 10/12/19  Yes Fayrene Helper, MD  pravastatin (PRAVACHOL) 40 MG tablet Take 1 tablet (40 mg total) by mouth daily. 05/31/19  Yes Fayrene Helper, MD  ramipril (ALTACE) 10 MG capsule TAKE (1) CAPSULE BY MOUTH ONCE DAILY. 08/09/19  Yes Fayrene Helper, MD  spironolactone-hydrochlorothiazide (ALDACTAZIDE) 25-25 MG tablet TAKE ONE TABLET BY MOUTH ONCE DAILY. 10/12/19  Yes Fayrene Helper, MD  ACCU-CHEK GUIDE test strip USE TO TEST THREE TIMES DAILY. 09/24/19   Fayrene Helper, MD  Accu-Chek Softclix Lancets lancets USE TO TEST 3 TIMES DAILY. 09/06/19   Fayrene Helper, MD  acetaminophen (TYLENOL ARTHRITIS PAIN) 650 MG CR tablet Take 650 mg by mouth 3 (three) times daily as needed. 1-2 by mouth three times a day as needed pain     [provider]  aspirin (ASPIRIN LOW DOSE) 81 MG tablet Take 81 mg by mouth at bedtime.     [provider]  cholecalciferol (VITAMIN D) 1000 UNITS tablet Take 1,000 Units by mouth daily.    [provider]  fluticasone (FLONASE) 50 MCG/ACT nasal spray Place 2 sprays into both nostrils daily as needed. 02/28/18   Fayrene Helper, MD  sitaGLIPtan (JANUVIA) 100 MG tablet Take 100 mg by mouth daily.    07/22/11  [provider]    Allergies    Patient has no known allergies.  Review of Systems   Review of Systems  Constitutional: Negative for chills, diaphoresis and fever.  HENT: Negative for congestion.   Eyes: Negative.   Respiratory: Positive for shortness of breath. Negative for chest tightness and wheezing.   Cardiovascular: Negative for chest pain.  Gastrointestinal: Negative for abdominal pain, nausea and vomiting.  Genitourinary: Negative.   Musculoskeletal: Positive for arthralgias. Negative for back pain,  joint swelling and neck pain.  Skin: Negative.  Negative for rash and wound.  Neurological: Negative for dizziness, weakness, light-headedness, numbness and headaches.  Psychiatric/Behavioral: Negative.     Physical Exam Updated Vital Signs BP (!) 150/84   Pulse (!) 101   Temp 97.6 F (36.4 C) (Oral)   Resp (!) 22   Ht 5' 6"  (1.676 m)   Wt 104.3 kg   SpO2 90%   BMI 37.12 kg/m   Physical Exam Vitals and nursing note reviewed.  Constitutional:      Appearance: She is well-developed.  HENT:     Head: Normocephalic and atraumatic.  Eyes:  Conjunctiva/sclera: Conjunctivae normal.  Cardiovascular:     Rate and Rhythm: Normal rate and regular rhythm.     Heart sounds: Normal heart sounds.  Pulmonary:     Effort: Pulmonary effort is normal.     Breath sounds: Normal breath sounds. No decreased breath sounds, wheezing or rhonchi.     Comments: Patient was initially hypoxic at 89% upon arrival.  O2 at 2 L with current O2 sats at 93% and patient is asymptomatic. Chest:     Chest wall: No tenderness.  Abdominal:     General: Bowel sounds are normal.     Palpations: Abdomen is soft.     Tenderness: There is no abdominal tenderness.  Musculoskeletal:        General: Normal range of motion.     Cervical back: Normal range of motion.     Right lower leg: Tenderness present. No edema.     Left lower leg: No edema.     Comments: She has mild tenderness to palpation at her right medial knee.  There is no significant edema, no palpable cords.  Skin:    General: Skin is warm and dry.  Neurological:     Mental Status: She is alert.     ED Results / Procedures / Treatments   Labs (all labs ordered are listed, but only abnormal results are displayed) Labs Reviewed  BASIC METABOLIC PANEL - Abnormal; Notable for the following components:      Result Value   BUN 26 (*)    Creatinine, Ser 1.86 (*)    Calcium 8.8 (*)    GFR calc non Af Amer 27 (*)    GFR calc Af Amer 31 (*)     All other components within normal limits  CBC WITH DIFFERENTIAL/PLATELET - Abnormal; Notable for the following components:   Hemoglobin 11.6 (*)    All other components within normal limits  D-DIMER, QUANTITATIVE (NOT AT [facility]) - Abnormal; Notable for the following components:   D-Dimer, Quant 19.18 (*)    All other components within normal limits  BRAIN NATRIURETIC PEPTIDE - Abnormal; Notable for the following components:   B Natriuretic Peptide 159.0 (*)    All other components within normal limits  TROPONIN I (HIGH SENSITIVITY) - Abnormal; Notable for the following components:   Troponin I (High Sensitivity) 47 (*)    All other components within normal limits  SARS CORONAVIRUS 2 BY RT PCR (HOSPITAL ORDER, [place] [facility])  HEPARIN LEVEL (UNFRACTIONATED)  APTT  TROPONIN I (HIGH SENSITIVITY)    EKG EKG Interpretation  Date/Time:  Monday October 22 2019 09:56:00 EDT Ventricular Rate:  99 PR Interval:    QRS Duration: 82 QT Interval:  408 QTC Calculation: 524 R Axis:   83 Text Interpretation: Sinus rhythm Low voltage with right axis deviation Prolonged QT interval No acute changes No significant change since last tracing Confirmed by Varney Biles [id number]) on 10/22/2019 10:40:26 AM   Radiology CT Angio Chest PE W and/or Wo Contrast  Result Date: 10/22/2019 CLINICAL DATA:  Shortness of breath. EXAM: CT ANGIOGRAPHY CHEST WITH CONTRAST TECHNIQUE: Multidetector CT imaging of the chest was performed using the standard protocol during bolus administration of intravenous contrast. Multiplanar CT image reconstructions and MIPs were obtained to evaluate the vascular anatomy. CONTRAST:  155m OMNIPAQUE IOHEXOL 350 MG/ML SOLN COMPARISON:  None. FINDINGS: Cardiovascular: Large right-sided pulmonary emboli are noted. RV/LV ratio of 1.0 is noted suggesting right heart strain. Smaller filling defect or embolus is  seen in left upper lobe branch. Mild cardiomegaly is noted. No  pericardial effusion is noted. Mediastinum/Nodes: No adenopathy is noted. Esophagus is unremarkable. 2.6 cm left thyroid nodule is noted. Lungs/Pleura: No pneumothorax or pleural effusion is noted. Minimal left posterior basilar subsegmental atelectasis is noted. 2.7 x 2.1 cm rounded well-defined low density is noted in the left lower lobe with average Hounsfield measurement of 40. It is uncertain if this represents possible complex pericardial cyst or mass. Upper Abdomen: No acute abnormality. Musculoskeletal: No chest wall abnormality. No acute or significant osseous findings. Review of the MIP images confirms the above findings. IMPRESSION: 1. Large right-sided pulmonary emboli are noted with CT evidence of right heart strain (RV/LV Ratio = 1.0) consistent with at least submassive (intermediate risk) PE. The presence of right heart strain has been associated with an increased risk of morbidity and mortality. Critical Value/emergent results were called by telephone at the time of interpretation on 10/22/2019 at 1:44 pm to provider Dr. Kathrynn Humble, Who verbally acknowledged these results. 2. 2.6 cm left thyroid nodule is noted. Recommend thyroid US. (Ref: J Am Coll Radiol. 2015 Feb;12(2): 143-50). 3. 2.7 x 2.1 cm rounded well-defined low density is noted in the left lower lobe with average Hounsfield measurement of 40. It is uncertain if this represents possible complex pericardial cyst or mass. PET scan is recommended for further evaluation. Electronically Signed   By: Marijo Conception M.D.   On: 10/22/2019 13:45   DG Chest Port 1 View  Result Date: 10/22/2019 CLINICAL DATA:  Shortness of breath for 5 days. EXAM: PORTABLE CHEST 1 VIEW COMPARISON:  Radiographs 05/26/2011 and 03/15/2008. FINDINGS: 1042 hours. The heart size is stable at the upper limits of normal for portable AP technique. The mediastinal contours are normal. The pulmonary vascularity is normal, and the lungs remain clear. There is no pleural  effusion or pneumothorax. No acute osseous findings are seen. There are degenerative changes throughout the spine and at both shoulders. Telemetry leads overlie the chest. IMPRESSION: No active cardiopulmonary process. Electronically Signed   By: Richardean Sale M.D.   On: 10/22/2019 10:50    Procedures Procedures (including critical care time)  CRITICAL CARE Performed by: Evalee Jefferson Total critical care time: 35 minutes Critical care time was exclusive of separately billable procedures and treating other patients. Critical care was necessary to treat or prevent imminent or life-threatening deterioration. Critical care was time spent personally by me on the following activities: development of treatment plan with patient and/or surrogate as well as nursing, discussions with consultants, evaluation of patient's response to treatment, examination of patient, obtaining history from patient or surrogate, ordering and performing treatments and interventions, ordering and review of laboratory studies, ordering and review of radiographic studies, pulse oximetry and re-evaluation of patient's condition.   Medications Ordered in ED Medications  magnesium hydroxide (MILK OF MAGNESIA) suspension 30 mL (has no administration in time range)  senna-docusate (Senokot-S) tablet 2 tablet (has no administration in time range)  heparin bolus via infusion 5,000 Units (has no administration in time range)    Followed by  heparin ADULT infusion 100 units/mL (25000 units/230m sodium chloride 0.45%) (has no administration in time range)  iohexol (OMNIPAQUE) 350 MG/ML injection 100 mL (100 mLs Intravenous Contrast Given 10/22/19 1319)    ED Course  I have reviewed the triage vital signs and the nursing notes.  Pertinent labs & imaging results that were available during my care of the patient were reviewed by me and considered in  my medical decision making (see chart for details).    MDM Rules/Calculators/A&P                           Labs and imaging reviewed and discussed with patient.  She does have a submassive right PE with right strain.  She is asymptomatic at rest, not requiring oxygen.  She will need admission for treatment however, CT imaging also positive for a left lobe cyst versus pericardial mass.  Left thyroid nodule also noted.  These results were discussed with patient.  Discussed these results with Dr. Kathrynn Humble who does not feel that she needs critical care involvement with minimal right strain present.  Her first troponin was slightly elevated at 47, delta troponin is pending at this time.  She denies chest pain.  Call placed to hospitalist for admission.  Discussed with Dr. Denton Brick who accepts for admission.  Heparin bolus ordered.  Heparin drip per pharmacy. Final Clinical Impression(s) / ED Diagnoses Final diagnoses:  Other acute pulmonary embolism with acute cor pulmonale (HCC)  Lung mass  Thyroid nodule    Rx / DC Orders ED Discharge Orders    None       Landis Martins 10/22/19 1509    Evalee Jefferson, PA-C 10/22/19 [place], [place], MD 10/23/19 [phone]

## 2019-10-22 NOTE — ED Notes (Signed)
Phlebotomy at bedside at this time.

## 2019-10-22 NOTE — Progress Notes (Signed)
ANTICOAGULATION CONSULT NOTE - Follow Up Consult  Pharmacy Consult for heparin Indication: pulmonary embolus  Labs: Recent Labs    10/22/19 1034 10/22/19 1441 10/22/19 1531 10/22/19 2230  HGB 11.6*  --   --   --   HCT 37.9  --   --   --   PLT 257  --   --   --   APTT  --   --  28  --   HEPARINUNFRC  --   --   --  0.48  CREATININE 1.86*  --   --   --   TROPONINIHS 47* 42*  --   --     Assessment/Plan:  69yo female therapeutic on heparin with initial dosing for PE. Will continue gtt at current rate and confirm stable with am labs.   Wynona Neat, PharmD, BCPS  10/22/2019,11:09 PM

## 2019-10-22 NOTE — Progress Notes (Signed)
ANTICOAGULATION CONSULT NOTE - Initial Consult  Pharmacy Consult for heparin Indication: pulmonary embolus  No Known Allergies  Patient Measurements: Height: 5\' 6"  (167.6 cm) Weight: 104.3 kg (230 lb) IBW/kg (Calculated) : 59.3 HEPARIN DW (KG): 83.2  Vital Signs: Temp: 97.6 F (36.4 C) (06/14 0955) Temp Source: Oral (06/14 0955) BP: 150/84 (06/14 1430) Pulse Rate: 101 (06/14 1430)  Labs: Recent Labs    10/22/19 1034  HGB 11.6*  HCT 37.9  PLT 257  CREATININE 1.86*  TROPONINIHS 47*    Estimated Creatinine Clearance: 34.3 mL/min (A) (by C-G formula based on SCr of 1.86 mg/dL (H)).   Medical History: Past Medical History:  Diagnosis Date  . Anemia   . Arthritis   . Chronic kidney disease   . Diabetes mellitus type II   . Dysphagia    unspecified   . GERD (gastroesophageal reflux disease)   . Hyperlipidemia 2000  . Hypertension 1995  . Insomnia   . Low back pain   . Obesity     Medications:  See med rec  Assessment: Patient presents with shortness of breath with exertion which started 5 days ago [place] . CT imaging also positive for a left lobe cyst versus pericardial mass. CT Shows e/o right heart strain consistent with at least submassive PE.  Pharmacy asked to start heparin.  Goal of Therapy:  Heparin level 0.3-0.7 units/ml Monitor platelets by anticoagulation protocol: Yes   Plan:  Give 5000 units bolus x 1 Start heparin infusion at 1300 units/hr Check anti-Xa level in ~8 hours and daily while on heparin Continue to monitor H&H and platelets  Isac Sarna, BS Vena [place], BCPS Clinical Pharmacist Pager [phone] Cristy Friedlander 10/22/2019,2:55 PM

## 2019-10-23 ENCOUNTER — Inpatient Hospital Stay (HOSPITAL_COMMUNITY): Payer: Medicare Other

## 2019-10-23 LAB — BASIC METABOLIC PANEL
Anion gap: 9 (ref 5–15)
BUN: 17 mg/dL (ref 8–23)
CO2: 25 mmol/L (ref 22–32)
Calcium: 8.6 mg/dL — ABNORMAL LOW (ref 8.9–10.3)
Chloride: 103 mmol/L (ref 98–111)
Creatinine, Ser: 1.56 mg/dL — ABNORMAL HIGH (ref 0.44–1.00)
GFR calc Af Amer: 39 mL/min — ABNORMAL LOW (ref >60)
GFR calc non Af Amer: 33 mL/min — ABNORMAL LOW (ref >60)
Glucose, Bld: 141 mg/dL — ABNORMAL HIGH (ref 70–99)
Potassium: 4.5 mmol/L (ref 3.5–5.1)
Sodium: 137 mmol/L (ref 135–145)

## 2019-10-23 LAB — CBC
HCT: 36.5 % (ref 36.0–46.0)
HCT: 37.3 % (ref 36.0–46.0)
Hemoglobin: 11.3 g/dL — ABNORMAL LOW (ref 12.0–15.0)
Hemoglobin: 11.5 g/dL — ABNORMAL LOW (ref 12.0–15.0)
MCH: 26.3 pg (ref 26.0–34.0)
MCH: 26.3 pg (ref 26.0–34.0)
MCHC: 31 g/dL (ref 30.0–36.0)
MCHC: 30.8 g/dL (ref 30.0–36.0)
MCV: 84.9 fL (ref 80.0–100.0)
MCV: 85.4 fL (ref 80.0–100.0)
Platelets: 264 10*3/uL (ref 150–400)
Platelets: 254 10*3/uL (ref 150–400)
RBC: 4.3 MIL/uL (ref 3.87–5.11)
RBC: 4.37 MIL/uL (ref 3.87–5.11)
RDW: 14.6 % (ref 11.5–15.5)
RDW: 14.6 % (ref 11.5–15.5)
WBC: 8.5 10*3/uL (ref 4.0–10.5)
WBC: 7.7 10*3/uL (ref 4.0–10.5)
nRBC: 0 % (ref 0.0–0.2)
nRBC: 0 % (ref 0.0–0.2)

## 2019-10-23 LAB — CBG MONITORING, ED
Glucose-Capillary: 134 mg/dL — ABNORMAL HIGH (ref 70–99)
Glucose-Capillary: 178 mg/dL — ABNORMAL HIGH (ref 70–99)

## 2019-10-23 LAB — MRSA PCR SCREENING: MRSA by PCR: NEGATIVE

## 2019-10-23 LAB — GLUCOSE, CAPILLARY
Glucose-Capillary: 175 mg/dL — ABNORMAL HIGH (ref 70–99)
Glucose-Capillary: 130 mg/dL — ABNORMAL HIGH (ref 70–99)

## 2019-10-23 LAB — HEMOGLOBIN A1C
Hgb A1c MFr Bld: 7.6 % — ABNORMAL HIGH (ref 4.8–5.6)
Mean Plasma Glucose: 171.42 mg/dL

## 2019-10-23 LAB — HEPARIN LEVEL (UNFRACTIONATED): Heparin Unfractionated: 0.47 IU/mL (ref 0.30–0.70)

## 2019-10-23 LAB — HIV ANTIBODY (ROUTINE TESTING W REFLEX): HIV Screen 4th Generation wRfx: NONREACTIVE

## 2019-10-23 MED ORDER — ONDANSETRON HCL 4 MG/2ML IJ SOLN: 4.0000 mg | Freq: Four times a day (QID) | INTRAMUSCULAR | Status: DC | PRN

## 2019-10-23 MED ORDER — CLONIDINE HCL 0.2 MG PO TABS
0.3000 mg | ORAL_TABLET | Freq: Three times a day (TID) | ORAL | Status: DC
  Administered 2019-10-23 – 2019-10-28 (×16): 0.3 mg via ORAL
  Filled 2019-10-23 (×16): qty 1

## 2019-10-23 MED ORDER — VITAMIN D 25 MCG (1000 UNIT) PO TABS
1000.0000 [IU] | ORAL_TABLET | Freq: Every day | ORAL | Status: DC
  Administered 2019-10-24 – 2019-10-28 (×5): 1000 [IU] via ORAL
  Filled 2019-10-23 (×7): qty 1

## 2019-10-23 MED ORDER — OXYCODONE HCL 5 MG PO TABS
5.0000 mg | ORAL_TABLET | ORAL | Status: DC | PRN
  Administered 2019-10-25 (×2): 5 mg via ORAL
  Filled 2019-10-23 (×2): qty 1

## 2019-10-23 MED ORDER — ORAL CARE MOUTH RINSE
15.0000 mL | Freq: Two times a day (BID) | OROMUCOSAL | Status: DC
  Administered 2019-10-24 – 2019-10-27 (×7): 15 mL via OROMUCOSAL

## 2019-10-23 MED ORDER — ONDANSETRON HCL 4 MG PO TABS: 4.0000 mg | ORAL_TABLET | Freq: Four times a day (QID) | ORAL | Status: DC | PRN

## 2019-10-23 MED ORDER — HYDRALAZINE HCL 20 MG/ML IJ SOLN: 10.0000 mg | INTRAMUSCULAR | Status: DC | PRN

## 2019-10-23 MED ORDER — ALBUTEROL SULFATE (2.5 MG/3ML) 0.083% IN NEBU
2.5000 mg | INHALATION_SOLUTION | RESPIRATORY_TRACT | Status: DC | PRN
  Administered 2019-10-24: 2.5 mg via RESPIRATORY_TRACT
  Filled 2019-10-23: qty 3

## 2019-10-23 MED ORDER — ACETAMINOPHEN ER 650 MG PO TBCR: 650.0000 mg | EXTENDED_RELEASE_TABLET | ORAL | Status: DC | PRN

## 2019-10-23 MED ORDER — POLYETHYLENE GLYCOL 3350 17 G PO PACK
17.0000 g | PACK | Freq: Every day | ORAL | Status: DC
  Administered 2019-10-23 – 2019-10-27 (×4): 17 g via ORAL
  Filled 2019-10-23 (×6): qty 1

## 2019-10-23 MED ORDER — CHLORHEXIDINE GLUCONATE CLOTH 2 % EX PADS
6.0000 | MEDICATED_PAD | Freq: Every day | CUTANEOUS | Status: DC
  Administered 2019-10-23 – 2019-10-27 (×4): 6 via TOPICAL

## 2019-10-23 MED ORDER — SODIUM CHLORIDE 0.9% FLUSH: 3.0000 mL | INTRAVENOUS | Status: DC | PRN

## 2019-10-23 MED ORDER — SODIUM CHLORIDE 0.9% FLUSH
3.0000 mL | Freq: Two times a day (BID) | INTRAVENOUS | Status: DC
  Administered 2019-10-24 – 2019-10-28 (×7): 3 mL via INTRAVENOUS

## 2019-10-23 MED ORDER — FENTANYL CITRATE (PF) 100 MCG/2ML IJ SOLN: 12.5000 ug | INTRAMUSCULAR | Status: DC | PRN

## 2019-10-23 MED ORDER — AMLODIPINE BESYLATE 5 MG PO TABS
10.0000 mg | ORAL_TABLET | Freq: Every day | ORAL | Status: DC
  Administered 2019-10-23 – 2019-10-28 (×6): 10 mg via ORAL
  Filled 2019-10-23 (×6): qty 2

## 2019-10-23 MED ORDER — SODIUM CHLORIDE 0.9 % IV SOLN: 250.0000 mL | INTRAVENOUS | Status: DC | PRN

## 2019-10-23 MED ORDER — DEXTROSE-NACL 5-0.45 % IV SOLN: INTRAVENOUS | Status: AC

## 2019-10-23 MED ORDER — ACETAMINOPHEN 650 MG RE SUPP: 650.0000 mg | Freq: Four times a day (QID) | RECTAL | Status: DC | PRN

## 2019-10-23 MED ORDER — TRAZODONE HCL 50 MG PO TABS
50.0000 mg | ORAL_TABLET | Freq: Every evening | ORAL | Status: DC | PRN
  Administered 2019-10-23 – 2019-10-27 (×3): 50 mg via ORAL
  Filled 2019-10-23 (×3): qty 1

## 2019-10-23 MED ORDER — INSULIN GLARGINE 100 UNIT/ML ~~LOC~~ SOLN
40.0000 [IU] | Freq: Every day | SUBCUTANEOUS | Status: DC
  Administered 2019-10-23 – 2019-10-28 (×6): 40 [IU] via SUBCUTANEOUS
  Filled 2019-10-23 (×7): qty 0.4

## 2019-10-23 MED ORDER — PRAVASTATIN SODIUM 40 MG PO TABS
40.0000 mg | ORAL_TABLET | Freq: Every day | ORAL | Status: DC
  Administered 2019-10-24 – 2019-10-28 (×5): 40 mg via ORAL
  Filled 2019-10-23 (×7): qty 1

## 2019-10-23 MED ORDER — ACETAMINOPHEN 325 MG PO TABS
650.0000 mg | ORAL_TABLET | Freq: Four times a day (QID) | ORAL | Status: DC | PRN
  Administered 2019-10-24 – 2019-10-28 (×4): 650 mg via ORAL
  Filled 2019-10-23 (×5): qty 2

## 2019-10-23 NOTE — ED Notes (Signed)
Pt was given meal tray.  

## 2019-10-23 NOTE — ED Notes (Signed)
ED TO INPATIENT HANDOFF REPORT  ED Nurse Name and Phone #: [phone]  S Name/Age/Gender Kathleen [name] 69 y.o. female Room/Bed: APA07/APA07  Code Status   Code Status: Full Code  Home/SNF/Other Home Patient oriented to: self, place, time and situation Is this baseline? Yes   Triage Complete: Triage complete  Chief Complaint Pulmonary embolism ([place]) [I26.99]  Triage Note Pt c/o SOB with minimal to no exertion since Wed after a beach trip, denies cough and fevers, no history of asthma, COPD or CHF,  O2 sats in triage 89%, placed on 2L [place] increased to 98%    Allergies No Known Allergies  Level of Care/Admitting Diagnosis ED Disposition    ED Disposition Condition [place]: [facility] [[id number]]  Level of Care: Stepdown [14]  Covid Evaluation: Asymptomatic Screening Protocol (No Symptoms)  Admission Type: Elective [3]  Diagnosis: Pulmonary embolism ([place]) [924268]  Admitting Physician: Morrison Old  Attending Physician: Morrison Old  Estimated length of stay: 3 - 4 days  Certification:: I certify this patient will need inpatient services for at least 2 midnights  Bed request comments: step down       B Medical/Surgery History Past Medical History:  Diagnosis Date  . Anemia   . Arthritis   . Chronic kidney disease   . Diabetes mellitus type II   . Dysphagia    unspecified   . GERD (gastroesophageal reflux disease)   . Hyperlipidemia 2000  . Hypertension 1995  . Insomnia   . Low back pain   . Obesity    Past Surgical History:  Procedure Laterality Date  . Carpal tunnel release     left   . CHOLECYSTECTOMY  2007  . DIGIT NAIL REMOVAL  08/2011  . KNEE SURGERY  1.20.2012   arthroscopy LEFT knee partial medial meniscectomy  . TENOTOMY     2,3,4  left foot   . toenail removal    . TUBAL LIGATION       A IV Location/Drains/Wounds Patient Lines/Drains/Airways Status    Active Line/Drains/Airways     Name Placement date Placement time Site Days   Peripheral IV 10/22/19 Right Antecubital 10/22/19  1221  Antecubital  1   External Urinary Catheter 10/22/19  1628  --  1          Intake/Output Last 24 hours No intake or output data in the 24 hours ending 10/23/19 1353  Labs/Imaging Results for orders placed or performed during the hospital encounter of 10/22/19 (from the past 48 hour(s))  Basic metabolic panel     Status: Abnormal   Collection Time: 10/22/19 10:34 AM  Result Value Ref Range   Sodium 136 135 - 145 mmol/L   Potassium 4.3 3.5 - 5.1 mmol/L   Chloride 102 98 - 111 mmol/L   CO2 23 22 - 32 mmol/L   Glucose, Bld 97 70 - 99 mg/dL    Comment: Glucose reference range applies only to samples taken after fasting for at least 8 hours.   BUN 26 (H) 8 - 23 mg/dL   Creatinine, Ser 1.86 (H) 0.44 - 1.00 mg/dL   Calcium 8.8 (L) 8.9 - 10.3 mg/dL   GFR calc non Af Amer 27 (L) >60 mL/min   GFR calc Af Amer 31 (L) >60 mL/min   Anion gap 11 5 - 15    Comment: Performed at [facility], [address] [place], [place] [address]  CBC with Differential/Platelet  Status: Abnormal   Collection Time: 10/22/19 10:34 AM  Result Value Ref Range   WBC 7.5 4.0 - 10.5 K/uL   RBC 4.44 3.87 - 5.11 MIL/uL   Hemoglobin 11.6 (L) 12.0 - 15.0 g/dL   HCT 37.9 36 - 46 %   MCV 85.4 80.0 - 100.0 fL   MCH 26.1 26.0 - 34.0 pg   MCHC 30.6 30.0 - 36.0 g/dL   RDW 14.6 11.5 - 15.5 %   Platelets 257 150 - 400 K/uL   nRBC 0.0 0.0 - 0.2 %   Neutrophils Relative % 67 %   Neutro Abs 5.0 1.7 - 7.7 K/uL   Lymphocytes Relative 23 %   Lymphs Abs 1.8 0.7 - 4.0 K/uL   Monocytes Relative 8 %   Monocytes Absolute 0.6 0 - 1 K/uL   Eosinophils Relative 2 %   Eosinophils Absolute 0.1 0 - 0 K/uL   Basophils Relative 0 %   Basophils Absolute 0.0 0 - 0 K/uL   Immature Granulocytes 0 %   Abs Immature Granulocytes 0.03 0.00 - 0.07 K/uL    Comment: Performed at [facility], [address] [place], [place] [address]   Troponin I (High Sensitivity)     Status: Abnormal   Collection Time: 10/22/19 10:34 AM  Result Value Ref Range   Troponin I (High Sensitivity) 47 (H) <18 ng/L    Comment: (NOTE) Elevated high sensitivity troponin I (hsTnI) values and significant  changes across serial measurements may suggest ACS but many other  chronic and acute conditions are known to elevate hsTnI results.  Refer to the "Links" section for chest pain algorithms and additional  guidance. Performed at [facility], [address] [place], [place] [address]   D-dimer, quantitative (not at [facility])     Status: Abnormal   Collection Time: 10/22/19 10:34 AM  Result Value Ref Range   D-Dimer, Quant 19.18 (H) 0.00 - 0.50 ug/mL-FEU    Comment: (NOTE) At the manufacturer cut-off of 0.50 ug/mL FEU, this assay has been documented to exclude PE with a sensitivity and negative predictive value of 97 to 99%.  At this time, this assay has not been approved by the FDA to exclude DVT/VTE. Results should be correlated with clinical presentation. Performed at [facility], [address] [place], [place] [address]   Brain natriuretic peptide     Status: Abnormal   Collection Time: 10/22/19 10:34 AM  Result Value Ref Range   B Natriuretic Peptide 159.0 (H) 0.0 - 100.0 pg/mL    Comment: Performed at [facility], [address], [place], [place] [address]  Hemoglobin A1c     Status: Abnormal   Collection Time: 10/22/19 10:34 AM  Result Value Ref Range   Hgb A1c MFr Bld 7.6 (H) 4.8 - 5.6 %    Comment: (NOTE) Pre diabetes:          5.7%-6.4%  Diabetes:              >6.4%  Glycemic control for   <7.0% adults with diabetes    Mean Plasma Glucose 171.42 mg/dL    Comment: Performed at [facility] [address] [place], [place] [address]  SARS Coronavirus 2 by RT PCR (hospital order, performed in [facility] hospital lab) Nasopharyngeal Nasopharyngeal Swab     Status: None   Collection Time: 10/22/19 11:17 AM   Specimen:  Nasopharyngeal Swab  Result Value Ref Range   SARS Coronavirus 2 NEGATIVE NEGATIVE  Comment: (NOTE) SARS-CoV-2 target nucleic acids are NOT DETECTED.  The SARS-CoV-2 RNA is generally detectable in upper and lower respiratory specimens during the acute phase of infection. The lowest concentration of SARS-CoV-2 viral copies this assay can detect is 250 copies / mL. A negative result does not preclude SARS-CoV-2 infection and should not be used as the sole basis for treatment or other patient management decisions.  A negative result may occur with improper specimen collection / handling, submission of specimen other than nasopharyngeal swab, presence of viral mutation(s) within the areas targeted by this assay, and inadequate number of viral copies (<250 copies / mL). A negative result must be combined with clinical observations, patient history, and epidemiological information.  Fact Sheet for Patients:   StrictlyIdeas.no  Fact Sheet for Healthcare Providers: BankingDealers.co.za  This test is not yet approved or  cleared by the [place] FDA and has been authorized for detection and/or diagnosis of SARS-CoV-2 by FDA under an Emergency Use Authorization (EUA).  This EUA will remain in effect (meaning this test can be used) for the duration of the COVID-19 declaration under Section 564(b)(1) of the Act, 21 U.S.C. section 360bbb-3(b)(1), unless the authorization is terminated or revoked sooner.  Performed at [facility], [address], [place], [place] [address]   Troponin I (High Sensitivity)     Status: Abnormal   Collection Time: 10/22/19  2:41 PM  Result Value Ref Range   Troponin I (High Sensitivity) 42 (H) <18 ng/L    Comment: (NOTE) Elevated high sensitivity troponin I (hsTnI) values and significant  changes across serial measurements may suggest ACS but many other  chronic and acute conditions are known to elevate hsTnI  results.  Refer to the "Links" section for chest pain algorithms and additional  guidance. Performed at [facility], [address], [place], [place] [address]   APTT     Status: None   Collection Time: 10/22/19  3:31 PM  Result Value Ref Range   aPTT 28 24 - 36 seconds    Comment: Performed at [facility], [address] [place], [place] [address]  CBG monitoring, ED     Status: Abnormal   Collection Time: 10/22/19 10:05 PM  Result Value Ref Range   Glucose-Capillary 189 (H) 70 - 99 mg/dL    Comment: Glucose reference range applies only to samples taken after fasting for at least 8 hours.  Heparin level (unfractionated)     Status: None   Collection Time: 10/22/19 10:30 PM  Result Value Ref Range   Heparin Unfractionated 0.48 0.30 - 0.70 IU/mL    Comment: (NOTE) If heparin results are below expected values, and patient dosage has  been confirmed, suggest follow up testing of antithrombin III levels. Performed at [facility], [address] [place], [place] [address]   TSH     Status: None   Collection Time: 10/22/19 10:30 PM  Result Value Ref Range   TSH 1.110 0.350 - 4.500 uIU/mL    Comment: Performed by a 3rd Generation assay with a functional sensitivity of <=0.01 uIU/mL. Performed at [facility], [address] [place],  [address]   CBC     Status: Abnormal   Collection Time: 10/23/19  4:33 AM  Result Value Ref Range   WBC 8.5 4.0 - 10.5 K/uL   RBC 4.30 3.87 - 5.11 MIL/uL   Hemoglobin 11.3 (L) 12.0 - 15.0 g/dL   HCT 36.5 36 - 46 %   MCV 84.9 80.0 - 100.0  fL   MCH 26.3 26.0 - 34.0 pg   MCHC 31.0 30.0 - 36.0 g/dL   RDW 14.6 11.5 - 15.5 %   Platelets 264 150 - 400 K/uL   nRBC 0.0 0.0 - 0.2 %    Comment: Performed at [facility], [address], [place], [place] [address]  Heparin level (unfractionated)     Status: None   Collection Time: 10/23/19  4:33 AM  Result Value Ref Range   Heparin Unfractionated 0.47 0.30 - 0.70 IU/mL    Comment: (NOTE) If heparin  results are below expected values, and patient dosage has  been confirmed, suggest follow up testing of antithrombin III levels. Performed at [facility], [address] [place], [place] [address]   HIV Antibody (routine testing w rflx)     Status: None   Collection Time: 10/23/19  7:23 AM  Result Value Ref Range   HIV Screen 4th Generation wRfx Non Reactive Non Reactive    Comment: Performed at [facility], [place] [address], [place], [place] [address]  Basic metabolic panel     Status: Abnormal   Collection Time: 10/23/19  7:23 AM  Result Value Ref Range   Sodium 137 135 - 145 mmol/L   Potassium 4.5 3.5 - 5.1 mmol/L   Chloride 103 98 - 111 mmol/L   CO2 25 22 - 32 mmol/L   Glucose, Bld 141 (H) 70 - 99 mg/dL    Comment: Glucose reference range applies only to samples taken after fasting for at least 8 hours.   BUN 17 8 - 23 mg/dL   Creatinine, Ser 1.56 (H) 0.44 - 1.00 mg/dL   Calcium 8.6 (L) 8.9 - 10.3 mg/dL   GFR calc non Af Amer 33 (L) >60 mL/min   GFR calc Af Amer 39 (L) >60 mL/min   Anion gap 9 5 - 15    Comment: Performed at [facility], [address] [place], [place] [address]  CBC     Status: Abnormal   Collection Time: 10/23/19  7:23 AM  Result Value Ref Range   WBC 7.7 4.0 - 10.5 K/uL   RBC 4.37 3.87 - 5.11 MIL/uL   Hemoglobin 11.5 (L) 12.0 - 15.0 g/dL   HCT 37.3 36 - 46 %   MCV 85.4 80.0 - 100.0 fL   MCH 26.3 26.0 - 34.0 pg   MCHC 30.8 30.0 - 36.0 g/dL   RDW 14.6 11.5 - 15.5 %   Platelets 254 150 - 400 K/uL   nRBC 0.0 0.0 - 0.2 %    Comment: Performed at [facility], [address] [place], [place] [address]  CBG monitoring, ED     Status: Abnormal   Collection Time: 10/23/19  8:02 AM  Result Value Ref Range   Glucose-Capillary 134 (H) 70 - 99 mg/dL    Comment: Glucose reference range applies only to samples taken after fasting for at least 8 hours.  CBG monitoring, ED     Status: Abnormal   Collection Time: 10/23/19 12:06 PM  Result Value Ref Range    Glucose-Capillary 178 (H) 70 - 99 mg/dL    Comment: Glucose reference range applies only to samples taken after fasting for at least 8 hours.   CT Angio Chest PE W and/or Wo Contrast  Result Date: 10/22/2019 CLINICAL DATA:  Shortness of breath. EXAM: CT ANGIOGRAPHY CHEST WITH CONTRAST TECHNIQUE: Multidetector CT imaging of the chest was performed using the standard protocol during bolus administration of intravenous  contrast. Multiplanar CT image reconstructions and MIPs were obtained to evaluate the vascular anatomy. CONTRAST:  134mL OMNIPAQUE IOHEXOL 350 MG/ML SOLN COMPARISON:  None. FINDINGS: Cardiovascular: Large right-sided pulmonary emboli are noted. RV/LV ratio of 1.0 is noted suggesting right heart strain. Smaller filling defect or embolus is seen in left upper lobe branch. Mild cardiomegaly is noted. No pericardial effusion is noted. Mediastinum/Nodes: No adenopathy is noted. Esophagus is unremarkable. 2.6 cm left thyroid nodule is noted. Lungs/Pleura: No pneumothorax or pleural effusion is noted. Minimal left posterior basilar subsegmental atelectasis is noted. 2.7 x 2.1 cm rounded well-defined low density is noted in the left lower lobe with average Hounsfield measurement of 40. It is uncertain if this represents possible complex pericardial cyst or mass. Upper Abdomen: No acute abnormality. Musculoskeletal: No chest wall abnormality. No acute or significant osseous findings. Review of the MIP images confirms the above findings. IMPRESSION: 1. Large right-sided pulmonary emboli are noted with CT evidence of right heart strain (RV/LV Ratio = 1.0) consistent with at least submassive (intermediate risk) PE. The presence of right heart strain has been associated with an increased risk of morbidity and mortality. Critical Value/emergent results were called by telephone at the time of interpretation on 10/22/2019 at 1:44 pm to provider Dr. Kathrynn Humble, Who verbally acknowledged these results. 2. 2.6 cm left  thyroid nodule is noted. Recommend thyroid US. (Ref: J Am Coll Radiol. 2015 Feb;12(2): 143-50). 3. 2.7 x 2.1 cm rounded well-defined low density is noted in the left lower lobe with average Hounsfield measurement of 40. It is uncertain if this represents possible complex pericardial cyst or mass. PET scan is recommended for further evaluation. Electronically Signed   By: Marijo Conception M.D.   On: 10/22/2019 13:45   US Venous Img Lower Bilateral (DVT)  Result Date: 10/22/2019 CLINICAL DATA:  Pulmonary embolism EXAM: BILATERAL LOWER EXTREMITY VENOUS DOPPLER ULTRASOUND TECHNIQUE: Gray-scale sonography with graded compression, as well as color Doppler and duplex ultrasound were performed to evaluate the lower extremity deep venous systems from the level of the common femoral vein and including the common femoral, femoral, profunda femoral, popliteal and calf veins including the posterior tibial, peroneal and gastrocnemius veins when visible. The superficial great saphenous vein was also interrogated. Spectral Doppler was utilized to evaluate flow at rest and with distal augmentation maneuvers in the common femoral, femoral and popliteal veins. COMPARISON:  None. FINDINGS: RIGHT LOWER EXTREMITY Common Femoral Vein: No evidence of thrombus. Normal compressibility, respiratory phasicity and response to augmentation. Saphenofemoral Junction: No evidence of thrombus. Normal compressibility and flow on color Doppler imaging. Profunda Femoral Vein: No evidence of thrombus. Normal compressibility and flow on color Doppler imaging. Femoral Vein: No evidence of thrombus. Normal compressibility, respiratory phasicity and response to augmentation. Popliteal Vein: No evidence of thrombus. Normal compressibility, respiratory phasicity and response to augmentation. Calf Veins: No evidence of thrombus. Normal compressibility and flow on color Doppler imaging. Superficial Great Saphenous Vein: No evidence of thrombus. Normal  compressibility. Venous Reflux:  None. Other Findings:  None. LEFT LOWER EXTREMITY Common Femoral Vein: No evidence of thrombus. Normal compressibility, respiratory phasicity and response to augmentation. Saphenofemoral Junction: No evidence of thrombus. Normal compressibility and flow on color Doppler imaging. Profunda Femoral Vein: No evidence of thrombus. Normal compressibility and flow on color Doppler imaging. Femoral Vein: No evidence of thrombus. Normal compressibility, respiratory phasicity and response to augmentation. Popliteal Vein: No evidence of thrombus. Normal compressibility, respiratory phasicity and response to augmentation. Calf Veins: No evidence of thrombus.  Normal compressibility and flow on color Doppler imaging. Superficial Great Saphenous Vein: No evidence of thrombus. Normal compressibility. Venous Reflux:  None. Other Findings:  None. IMPRESSION: No evidence of deep venous thrombosis in either lower extremity. Electronically Signed   By: Franchot Gallo M.D.   On: 10/22/2019 15:53   DG Chest Port 1 View  Result Date: 10/22/2019 CLINICAL DATA:  Shortness of breath for 5 days. EXAM: PORTABLE CHEST 1 VIEW COMPARISON:  Radiographs 05/26/2011 and 03/15/2008. FINDINGS: 1042 hours. The heart size is stable at the upper limits of normal for portable AP technique. The mediastinal contours are normal. The pulmonary vascularity is normal, and the lungs remain clear. There is no pleural effusion or pneumothorax. No acute osseous findings are seen. There are degenerative changes throughout the spine and at both shoulders. Telemetry leads overlie the chest. IMPRESSION: No active cardiopulmonary process. Electronically Signed   By: Richardean Sale M.D.   On: 10/22/2019 10:50   ECHOCARDIOGRAM COMPLETE  Result Date: 10/22/2019    ECHOCARDIOGRAM REPORT   Patient Name:   Kathleen [name] [facility] Date of Exam: 10/22/2019 Medical Rec #:  [id number]       Height:       66.0 in Accession #:    [id number]       Weight:       230.0 lb Date of Birth:  May 28, 1950      BSA:          2.122 m Patient Age:    56 years        BP:           150/84 mmHg Patient Gender: F               HR:           101 bpm. Exam Location:  [facility] Procedure: 2D Echo, Cardiac Doppler and Color Doppler Indications:    Pulmonary Embolus 415.19 / I26.99  History:        Patient has no prior history of Echocardiogram examinations.                 Risk Factors:Diabetes, Dyslipidemia and Hypertension. Acute                 respiratory failure with hypoxia,CKD (chronic kidney disease)                 stage 4,Morbid obesity,Pulmonary embolism.  Sonographer:    Alvino Chapel RCS Referring Phys: [phone] COURAGE EMOKPAE IMPRESSIONS  1. Left ventricular ejection fraction, by estimation, is 65 to 70%. The left ventricle has normal function. The left ventricle has no regional wall motion abnormalities. There is mild left ventricular hypertrophy.  2. Right ventricular systolic function is mildly reduced. The right ventricular size is mildly enlarged.  3. Right atrial size was mildly dilated.  4. The mitral valve is normal in structure. No evidence of mitral valve regurgitation.  5. The aortic valve is tricuspid. Aortic valve regurgitation is not visualized. Mild aortic valve sclerosis is present, with no evidence of aortic valve stenosis.  6. The inferior vena cava is normal in size with greater than 50% respiratory variability, suggesting right atrial pressure of 3 mmHg. FINDINGS  Left Ventricle: Left ventricular ejection fraction, by estimation, is 65 to 70%. The left ventricle has normal function. The left ventricle has no regional wall motion abnormalities. The left ventricular internal cavity size was normal in size. There is  mild left ventricular hypertrophy. Left ventricular diastolic function could not  be evaluated. Right Ventricle: The right ventricular size is mildly enlarged. Right vetricular wall thickness was not assessed. Right ventricular  systolic function is mildly reduced. Left Atrium: Left atrial size was normal in size. Right Atrium: Right atrial size was mildly dilated. Pericardium: There is no evidence of pericardial effusion. Mitral Valve: The mitral valve is normal in structure. No evidence of mitral valve regurgitation. Tricuspid Valve: The tricuspid valve is normal in structure. Tricuspid valve regurgitation is trivial. Aortic Valve: The aortic valve is tricuspid. Aortic valve regurgitation is not visualized. Mild aortic valve sclerosis is present, with no evidence of aortic valve stenosis. Pulmonic Valve: The pulmonic valve was grossly normal. Pulmonic valve regurgitation is not visualized. Aorta: The aortic root is normal in size and structure. Venous: The inferior vena cava is normal in size with greater than 50% respiratory variability, suggesting right atrial pressure of 3 mmHg. IAS/Shunts: No atrial level shunt detected by color flow Doppler.  LEFT VENTRICLE PLAX 2D LVIDd:         3.43 cm  Diastology LVIDs:         2.14 cm  LV e' lateral:   7.62 cm/s LV PW:         1.15 cm  LV E/e' lateral: 10.2 LV IVS:        1.31 cm  LV e' medial:    6.74 cm/s LVOT diam:     1.90 cm  LV E/e' medial:  11.6 LV SV:         48 LV SV Index:   23 LVOT Area:     2.84 cm  RIGHT VENTRICLE RV S prime:     13.20 cm/s TAPSE (M-mode): 1.9 cm LEFT ATRIUM             Index       RIGHT ATRIUM           Index LA diam:        2.60 cm 1.23 cm/m  RA Area:     13.90 cm LA Vol (A2C):   30.0 ml 14.14 ml/m RA Volume:   29.80 ml  14.04 ml/m LA Vol (A4C):   29.8 ml 14.04 ml/m LA Biplane Vol: 29.8 ml 14.04 ml/m  AORTIC VALVE LVOT Vmax:   128.00 cm/s LVOT Vmean:  78.900 cm/s LVOT VTI:    0.171 m  AORTA Ao Root diam: 3.10 cm MITRAL VALVE MV Area (PHT): 2.54 cm     SHUNTS MV Decel Time: 299 msec     Systemic VTI:  0.17 m MV E velocity: 78.10 cm/s   Systemic Diam: 1.90 cm MV A velocity: 108.00 cm/s MV E/A ratio:  0.72 Dorris Carnes MD Electronically signed by Dorris Carnes MD  Signature Date/Time: 10/22/2019/4:36:41 PM    Final    US THYROID  Result Date: 10/23/2019 CLINICAL DATA:  Thyroid nodule by CT EXAM: THYROID ULTRASOUND TECHNIQUE: Ultrasound examination of the thyroid gland and adjacent soft tissues was performed. COMPARISON:  10/22/2019 FINDINGS: Parenchymal Echotexture: Moderately heterogenous Isthmus: 4 mm Right lobe: 5.3 x 2.9 x 1.7 cm Left lobe: 5.6 x 3.4 x 2.7 cm _________________________________________________________ Estimated total number of nodules >/= 1 cm: 1 Number of spongiform nodules >/=  2 cm not described below (TR1): 0 Number of mixed cystic and solid nodules >/= 1.5 cm not described below (TR2): 0 _________________________________________________________ Nodule # 1: Location: Left; Inferior Maximum size: 2.6 cm; Other 2 dimensions: 2.2 x 2.0 cm Composition: solid/almost completely solid (2) Echogenicity: hypoechoic (2) Shape: not taller-than-wide (  0) Margins: ill-defined (0) Echogenic foci: none (0) ACR TI-RADS total points: 4. ACR TI-RADS risk category: TR4 (4-6 points). ACR TI-RADS recommendations: **Given size (>/= 1.5 cm) and appearance, fine needle aspiration of this moderately suspicious nodule should be considered based on TI-RADS criteria. _________________________________________________________ Nonspecific moderate thyroid heterogeneity. Limited exam because of body habitus. No significant right thyroid abnormality. No hypervascularity or regional adenopathy. IMPRESSION: 2.6 cm left inferior TR 4 nodule meets criteria for biopsy as above. This correlates with the CT finding. The above is in keeping with the ACR TI-RADS recommendations - J Am Coll Radiol 2017;14:587-595. Electronically Signed   By: Jerilynn Mages.  Shick M.D.   On: 10/23/2019 09:37    Pending Labs Unresulted Labs (From admission, onward) Comment          Start     Ordered   10/23/19 0500  CBC  Daily,   R      10/22/19 1505   10/23/19 0500  Heparin level (unfractionated)  Daily,   R       10/22/19 1505          Vitals/Pain Today's Vitals   10/23/19 0630 10/23/19 1230 10/23/19 1300 10/23/19 1330  BP: (!) 154/73 (!) 159/74 (!) 160/77 (!) 146/79  Pulse: 89 97 95 92  Resp: 19 (!) 25 20 (!) 25  Temp:      TempSrc:      SpO2: 98% 92% 94% 91%  Weight:      Height:      PainSc:        Isolation Precautions No active isolations  Medications Medications  senna-docusate (Senokot-S) tablet 2 tablet (2 tablets Oral Given 10/23/19 1059)  heparin bolus via infusion 5,000 Units (5,000 Units Intravenous Bolus from Bag 10/22/19 1533)    Followed by  heparin ADULT infusion 100 units/mL (25000 units/227mL sodium chloride 0.45%) (1,300 Units/hr Intravenous New Bag/Given 10/22/19 1534)  cloNIDine (CATAPRES) tablet 0.1 mg (has no administration in time range)  dextrose 5 %-0.45 % sodium chloride infusion ( Intravenous New Bag/Given 10/23/19 0638)  insulin glargine (LANTUS) injection 40 Units (40 Units Subcutaneous Given 10/23/19 1042)  cholecalciferol (VITAMIN D3) tablet 1,000 Units (1,000 Units Oral Not Given 10/23/19 1207)  pravastatin (PRAVACHOL) tablet 40 mg (40 mg Oral Not Given 10/23/19 1207)  cloNIDine (CATAPRES) tablet 0.3 mg (0.3 mg Oral Given 10/23/19 1042)  amLODipine (NORVASC) tablet 10 mg (10 mg Oral Given 10/23/19 1043)  sodium chloride flush (NS) 0.9 % injection 3 mL (3 mLs Intravenous Not Given 10/23/19 1044)  sodium chloride flush (NS) 0.9 % injection 3 mL (has no administration in time range)  0.9 %  sodium chloride infusion (has no administration in time range)  traZODone (DESYREL) tablet 50 mg (has no administration in time range)  polyethylene glycol (MIRALAX / GLYCOLAX) packet 17 g (17 g Oral Given 10/23/19 1043)  ondansetron (ZOFRAN) tablet 4 mg (has no administration in time range)    Or  ondansetron (ZOFRAN) injection 4 mg (has no administration in time range)  albuterol (PROVENTIL) (2.5 MG/3ML) 0.083% nebulizer solution 2.5 mg (has no administration in time  range)  acetaminophen (TYLENOL) tablet 650 mg (has no administration in time range)    Or  acetaminophen (TYLENOL) suppository 650 mg (has no administration in time range)  oxyCODONE (Oxy IR/ROXICODONE) immediate release tablet 5 mg (has no administration in time range)  hydrALAZINE (APRESOLINE) injection 10 mg (has no administration in time range)  fentaNYL (SUBLIMAZE) injection 12.5-50 mcg (has no administration in time range)  pantoprazole (PROTONIX) EC tablet 40 mg (40 mg Oral Given 10/23/19 1043)  insulin aspart (novoLOG) injection 0-10 Units (0 Units Subcutaneous Not Given 10/23/19 1229)  insulin aspart (novoLOG) injection 0-5 Units (0 Units Subcutaneous Not Given 10/22/19 2200)  insulin aspart (novoLOG) injection 4 Units (4 Units Subcutaneous Given 10/23/19 1234)  iohexol (OMNIPAQUE) 350 MG/ML injection 100 mL (100 mLs Intravenous Contrast Given 10/22/19 1319)    Mobility walks Low fall risk   Focused Assessments    R Recommendations: See Admitting Provider Note  Report given to:   Additional Notes:

## 2019-10-23 NOTE — Progress Notes (Signed)
ANTICOAGULATION CONSULT NOTE -   Pharmacy Consult for heparin Indication: pulmonary embolus  No Known Allergies  Patient Measurements: Height: 5\' 6"  (167.6 cm) Weight: 104.3 kg (230 lb) IBW/kg (Calculated) : 59.3 HEPARIN DW (KG): 83.2  Vital Signs: BP: 154/73 (06/15 0630) Pulse Rate: 89 (06/15 0630)  Labs: Recent Labs    10/22/19 1034 10/22/19 1034 10/22/19 1441 10/22/19 1531 10/22/19 2230 10/23/19 0433 10/23/19 0723  HGB 11.6*   < >  --   --   --  11.3* 11.5*  HCT 37.9  --   --   --   --  36.5 37.3  PLT 257  --   --   --   --  264 254  APTT  --   --   --  28  --   --   --   HEPARINUNFRC  --   --   --   --  0.48 0.47  --   CREATININE 1.86*  --   --   --   --   --  1.56*  TROPONINIHS 47*  --  42*  --   --   --   --    < > = values in this interval not displayed.    Estimated Creatinine Clearance: 40.9 mL/min (A) (by C-G formula based on SCr of 1.56 mg/dL (H)).   Medical History: Past Medical History:  Diagnosis Date  . Anemia   . Arthritis   . Chronic kidney disease   . Diabetes mellitus type II   . Dysphagia    unspecified   . GERD (gastroesophageal reflux disease)   . Hyperlipidemia 2000  . Hypertension 1995  . Insomnia   . Low back pain   . Obesity     Medications:  See med rec  Assessment: Patient presents with shortness of breath with exertion which started 5 days ago [place] . CT imaging also positive for a left lobe cyst versus pericardial mass. CT Shows e/o right heart strain consistent with at least submassive PE. Pharmacy asked to start heparin.  Heparin levels therapeutic 0.47>> 0.48  Goal of Therapy:  Heparin level 0.3-0.7 units/ml Monitor platelets by anticoagulation protocol: Yes   Plan:  Continue heparin infusion at 1300 units/hr Check anti-Xa level daily while on heparin Continue to monitor H&H and platelets F/U plan  Isac Sarna, BS [place], BCPS Clinical  Pharmacist Pager [phone] 10/23/2019,8:37 AM

## 2019-10-23 NOTE — Progress Notes (Signed)
NAME:  Kathleen [name], MRN:  [id number], DOB:  Apr 18, 1951, LOS: 1 ADMISSION DATE:  10/22/2019, CONSULTATION DATE:  6/14 REFERRING MD:  Triad, CHIEF COMPLAINT:  Sob    Brief History   47 yobf  Never smoker With chronic leg swelling much worse on R since around 10/17/2019 and worse sob as well presented to [facility] am 6/14 with w/u pos for CTa evidence of  extensive PE with s  R ht strain so PCCM service asked to consult      Past Medical History  DM HBP on ACEi  CRI with baseline creat  1.6  DJD   Significant Hospital Events   Admit 6/14   Consults:  PCCM  Procedures:    Significant Diagnostic Tests:  CTa 6/14: 1. Large right-sided pulmonary emboli are noted with CT evidence of right heart strain (RV/LV Ratio = 1.0) consistent with at least submassive (intermediate risk) PE. The presence of right heart strain has been associated with an increased risk of morbidity and mortality. Critical Value/emergent results were called by telephone at the time of interpretation on 10/22/2019 at 1:44 pm to provider Dr. Kathrynn Humble, Who verbally acknowledged these results. 2. 2.6 cm left thyroid nodule is noted. Recommend thyroid US. (Ref: J Am Coll Radiol. 2015 Feb;12(2): 143-50). 3. 2.7 x 2.1 cm rounded well-defined low density is noted in the left lower lobe with average Hounsfield measurement of 40. It is uncertain if this represents possible complex pericardial cyst or mass. PET scan is recommended for further evaluation. Venous dopplers 6/14:  Neg  Echo 6/14 : 1. Left ventricular ejection fraction, by estimation, is 65 to 70%. The  left ventricle has normal function. The left ventricle has no regional  wall motion abnormalities. There is mild left ventricular hypertrophy.  2. Right ventricular systolic function is mildly reduced. The right  ventricular size is mildly enlarged.  3. Right atrial size was mildly dilated.  4. The mitral valve is normal in structure. No evidence of mitral  valve  regurgitation.  5. The aortic valve is tricuspid. Aortic valve regurgitation is not  visualized. Mild aortic valve sclerosis is present, with no evidence of  aortic valve stenosis.  6. The inferior vena cava is normal in size with greater than 50%  respiratory variability, suggesting right atrial pressure of 3 mmHg. Venous dopplers   Micro Data:  Covid 19 PCR  6/14  Neg   Antimicrobials:    Scheduled Meds: . amLODipine  10 mg Oral Daily  . Chlorhexidine Gluconate Cloth  6 each Topical Daily  . cholecalciferol  1,000 Units Oral Daily  . cloNIDine  0.3 mg Oral TID  . insulin aspart  0-10 Units Subcutaneous TID WC  . insulin aspart  0-5 Units Subcutaneous QHS  . insulin aspart  4 Units Subcutaneous TID WC  . insulin glargine  40 Units Subcutaneous Daily  . pantoprazole  40 mg Oral Daily  . polyethylene glycol  17 g Oral Daily  . pravastatin  40 mg Oral Daily  . senna-docusate  2 tablet Oral BID  . sodium chloride flush  3 mL Intravenous Q12H   Continuous Infusions: . sodium chloride    . heparin 1,300 Units/hr (10/23/19 1517)   PRN Meds:.    Interim history/subjective:  breathin better, no cp   Objective   Blood pressure (!) 144/77, pulse 94, temperature 97.6 F (36.4 C), temperature source Oral, resp. rate 17, height 5\' 6"  (1.676 m), weight 104.4 kg, SpO2 96 %.  Intake/Output Summary (Last 24 hours) at 10/23/2019 1544 Last data filed at 10/23/2019 1517 Gross per 24 hour  Intake 356.34 ml  Output --  Net 356.34 ml   Filed Weights   10/22/19 0952 10/23/19 1430  Weight: 104.3 kg 104.4 kg    Examination: Tmax 97 / 02 2lpm  Pt alert, approp nad @ 30 degrees hob No jvd Oropharynx clear,  mucosa nl Neck supple Lungs clear  bilaterally RRR no s3 or or sign murmur Abd mod obese with nl  excursion  Extr warm with no edema or clubbing noted/ minimal R calf tenderness s cords/ neg Homan's  Neuro  Sensorium intact,  no apparent motor deficits     Resolved Hospital Problem list      Assessment & Plan:  1) Acute PE in setting  Of probable recent R DVT ? Chronicity s circulatory shock or refractory hypoxemia at present with no underlying h/o or findings for chronic lung dz or prior right heart issues - has done fine with just IV hep at theraptic levels since 11 pm 6/14 >>>  rec overnight Hep then start DOAC in am (whatever insurance prefers)   >>> ok to mobilize p 24 h therapeutic hep since no hemodynamic compromise and R V looks relatively good on echo with neg venous dopplers so no large clot "substrate" for additional PE has been identified   2) Acute hypoxemic resp failure secondary to PE >>> titrate to sats > 95% to help with afterload reduction on the R ht  3)  CRI on acei now s/p IV contrast > keep hydrated, hold acei, monitor UOP closely  Lab Results  Component Value Date   CREATININE 1.56 (H) 10/23/2019   CREATININE 1.86 (H) 10/22/2019   CREATININE 1.62 (H) 05/24/2019   CREATININE 1.55 (H) 02/02/2019   CREATININE 1.59 (H) 09/25/2018     4) ? Left lung mass ? Could this be infract or a mucin secreting tumor - rec PET vs repeat ct  in 6 weeks no sooner as not an immediate candidate for bx anyway.     Labs   CBC: Recent Labs  Lab 10/22/19 1034 10/23/19 0433 10/23/19 0723  WBC 7.5 8.5 7.7  NEUTROABS 5.0  --   --   HGB 11.6* 11.3* 11.5*  HCT 37.9 36.5 37.3  MCV 85.4 84.9 85.4  PLT 257 264 009    Basic Metabolic Panel: Recent Labs  Lab 10/22/19 1034 10/23/19 0723  NA 136 137  K 4.3 4.5  CL 102 103  CO2 23 25  GLUCOSE 97 141*  BUN 26* 17  CREATININE 1.86* 1.56*  CALCIUM 8.8* 8.6*   GFR: Estimated Creatinine Clearance: 40.9 mL/min (A) (by C-G formula based on SCr of 1.56 mg/dL (H)). Recent Labs  Lab 10/22/19 1034 10/23/19 0433 10/23/19 0723  WBC 7.5 8.5 7.7    Liver Function Tests: No results for input(s): AST, ALT, ALKPHOS, BILITOT, PROT, ALBUMIN in the last 168 hours. No results for  input(s): LIPASE, AMYLASE in the last 168 hours. No results for input(s): AMMONIA in the last 168 hours.  ABG No results found for: PHART, PCO2ART, PO2ART, HCO3, TCO2, ACIDBASEDEF, O2SAT   Coagulation Profile: No results for input(s): INR, PROTIME in the last 168 hours.  Cardiac Enzymes: No results for input(s): CKTOTAL, CKMB, CKMBINDEX, TROPONINI in the last 168 hours.  HbA1C: Hgb A1c MFr Bld  Date/Time Value Ref Range Status  10/22/2019 10:34 AM 7.6 (H) 4.8 - 5.6 % Final  Comment:    (NOTE) Pre diabetes:          5.7%-6.4%  Diabetes:              >6.4%  Glycemic control for   <7.0% adults with diabetes   05/24/2019 10:48 AM 8.3 (H) <5.7 % of total Hgb Final    Comment:    For someone without known diabetes, a hemoglobin A1c value of 6.5% or greater indicates that they may have  diabetes and this should be confirmed with a follow-up  test. . For someone with known diabetes, a value <7% indicates  that their diabetes is well controlled and a value  greater than or equal to 7% indicates suboptimal  control. A1c targets should be individualized based on  duration of diabetes, age, comorbid conditions, and  other considerations. . Currently, no consensus exists regarding use of hemoglobin A1c for diagnosis of diabetes for children. .     CBG: Recent Labs  Lab 10/22/19 2205 10/23/19 0802 10/23/19 1206  GLUCAP 189* 134* 178*      Pulmonary f/u can be as outpt in 6 weeks, thx   Christinia Gully, MD Pulmonary and [place] [phone] After 6:00 PM or weekends, use Beeper [phone]  After 7:00 pm call Elink  [phone]

## 2019-10-23 NOTE — Progress Notes (Signed)
Patient Demographics:    Kathleen [name], is a 69 y.o. female, DOB - [birth year], DTH:[id number]  Admit date - 10/22/2019   Admitting Physician Arvin Abello Denton Brick, MD  Outpatient Primary MD for the patient is Fayrene Helper, MD  LOS - 1   Chief Complaint  Patient presents with  . 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,  No chest pain,  --Dyspnea on exertion but no shortness of breath at rest -De-Saturations when she falls asleep suggesting possible OSA  Assessment  & Plan :    Principal Problem:   Pulmonary embolism (HCC) Active Problems:   Acute respiratory failure with hypoxia (HCC)   CKD (chronic kidney disease), stage IIIb   Type 2 diabetes mellitus with vascular disease (HCC)   Morbid obesity (HCC)   Essential hypertension   GERD   1)Acute Bil PE----patient with large right-sided PE and small left upper lobe PE--- Echo with preserved EF of 65 to 70% with no significant right heart strain on echo -LE venous Dopplers without DVT -Patient may benefit from abdominal pelvic imaging -Patient [facility] and DOE started prior to trip to the beach, symptoms worsened immediately after she arrived at the beach hotel could even walk up from the elevator to her hotel room --This PE may have been present prior to patient's for a trip to the beach symptoms only worsened on that particular day rather than started that day -Patient will need malignancy work-up as part of work-up of a hypercoagulable state -Last colonoscopy more than 10 years ago apparently,  last mammogram 2021 -CTA chest also showed 2.7 x 2.1 cm rounded well-defined low density is noted in the left lower lobe --patient will need outpatient PET scan  -Now on IV heparin drip may consider transition to [place] in a.m.  2)CKD IIIb--Creatinine is down to 1.5 from 1.86 on admission --recent baseline  usually around 1.5-1.6 -patient had contrast/CTA study so hold Aldactone/hctz, hold ramipril --Patient is at risk for worsening renal function due to contrast exposure -- renally adjust medications, avoid nephrotoxic agents / dehydration  / hypotension  - 3)DM2- A1c 8.3 , reflecting uncontrolled diabetes PTA had hypoglycemic episode in the ED, -PTA was on Lantus 60 mg twice daily, changed to 40 mg daily for now along with sliding scale coverage  4)HTN-continue amlodipine and clonidine ---hold ramipril and Aldactone/HCTZ due to kidney concerns -IV hydralazine as needed elevated BP  5)HLD--stable continue pravastatin and aspirin  6) obesity and osteoarthritis----outpatient management  as advised  7) acute hypoxic respiratory failure--- secondary to #1 above, manage as above #1  8)-De-Saturations when she falls asleep suggesting possible OSA=--outpatient sleep study advised  9)Thyroid Nodule---- 2.6 cm left inferior TR 4 nodule meets criteria for biopsy as above. This correlates with the CT finding.  Status is: Inpatient  Remains inpatient appropriate because:IV treatments appropriate due to intensity of illness or inability to take PO and Inpatient level of care appropriate due to severity of illness   Dispo: The patient is from: Home  Anticipated d/c is to: with HH Vs SNF  Anticipated d/c date is: > 3 days  Patient currently is not medically stable to d/c. Barriers: Not Clinically  Stable- -acute hypoxic respiratory failure secondary to PE requiring IV heparin and oxygen supplementation   Code Status : full  Family Communication:  (patient is alert, awake and coherent) --discussed with patient's sister  Consults  :  PCCM  DVT Prophylaxis  : IV heparin   Lab Results  Component Value Date   PLT 254 10/23/2019    Inpatient Medications  Scheduled Meds: . amLODipine  10 mg Oral Daily  . Chlorhexidine Gluconate Cloth  6 each  Topical Daily  . cholecalciferol  1,000 Units Oral Daily  . cloNIDine  0.3 mg Oral TID  . insulin aspart  0-10 Units Subcutaneous TID WC  . insulin aspart  0-5 Units Subcutaneous QHS  . insulin aspart  4 Units Subcutaneous TID WC  . insulin glargine  40 Units Subcutaneous Daily  . pantoprazole  40 mg Oral Daily  . polyethylene glycol  17 g Oral Daily  . pravastatin  40 mg Oral Daily  . senna-docusate  2 tablet Oral BID  . sodium chloride flush  3 mL Intravenous Q12H   Continuous Infusions: . sodium chloride    . heparin 1,300 Units/hr (10/23/19 1517)   PRN Meds:.sodium chloride, acetaminophen **OR** acetaminophen, albuterol, cloNIDine, fentaNYL (SUBLIMAZE) injection, hydrALAZINE, ondansetron **OR** ondansetron (ZOFRAN) IV, oxyCODONE, sodium chloride flush, traZODone    Anti-infectives (From admission, onward)   None        Objective:   Vitals:   10/23/19 1700 10/23/19 1730 10/23/19 1800 10/23/19 1830  BP: (!) 150/80 (!) 164/74 137/66 131/66  Pulse: 100 (!) 105 100 92  Resp: (!) 24 15 (!) 24 (!) 28  Temp:      TempSrc:      SpO2: 95% 95% 96% 96%  Weight:      Height:        Wt Readings from Last 3 Encounters:  10/23/19 104.4 kg  09/03/19 105.9 kg  05/21/19 105.2 kg     Intake/Output Summary (Last 24 hours) at 10/23/2019 1939 Last data filed at 10/23/2019 1517 Gross per 24 hour  Intake 356.34 ml  Output --  Net 356.34 ml     Physical Exam  Gen:- Awake Alert, speaking in short sentences HEENT:- [place].AT, No sclera icterus Neck-Supple Neck,No JVD,.  Lungs-diminished in bases without wheezing CV- S1, S2 normal, regular  Abd-  +ve B.Sounds, Abd Soft, No tenderness,    Extremity/Skin:- No significant edema, pedal pulses present  Psych-affect is appropriate, oriented x3 Neuro-generalized weakness, no new focal deficits, no tremors   Data Review:   Micro Results Recent Results (from the past 240 hour(s))  SARS Coronavirus 2 by RT PCR (hospital order,  performed in [facility] hospital lab) Nasopharyngeal Nasopharyngeal Swab     Status: None   Collection Time: 10/22/19 11:17 AM   Specimen: Nasopharyngeal Swab  Result Value Ref Range Status   SARS Coronavirus 2 NEGATIVE NEGATIVE Final    Comment: (NOTE) SARS-CoV-2 target nucleic acids are NOT DETECTED.  The SARS-CoV-2 RNA is generally detectable in upper and lower respiratory specimens during the acute phase of infection. The lowest concentration of SARS-CoV-2 viral copies this assay can detect is 250 copies / mL. A negative result does not preclude SARS-CoV-2 infection and should not be used as the sole basis for treatment or other patient management decisions.  A negative result may occur with improper specimen collection / handling, submission of specimen other than nasopharyngeal swab, presence of viral mutation(s) within the areas targeted by this assay, and inadequate number of viral  copies (<250 copies / mL). A negative result must be combined with clinical observations, patient history, and epidemiological information.  Fact Sheet for Patients:   StrictlyIdeas.no  Fact Sheet for Healthcare Providers: BankingDealers.co.za  This test is not yet approved or  cleared by the [place] FDA and has been authorized for detection and/or diagnosis of SARS-CoV-2 by FDA under an Emergency Use Authorization (EUA).  This EUA will remain in effect (meaning this test can be used) for the duration of the COVID-19 declaration under Section 564(b)(1) of the Act, 21 U.S.C. section 360bbb-3(b)(1), unless the authorization is terminated or revoked sooner.  Performed at [facility], [address], [place], [place] [address]     Radiology Reports CT Angio Chest PE W and/or Wo Contrast  Result Date: 10/22/2019 CLINICAL DATA:  Shortness of breath. EXAM: CT ANGIOGRAPHY CHEST WITH CONTRAST TECHNIQUE: Multidetector CT imaging of the chest was  performed using the standard protocol during bolus administration of intravenous contrast. Multiplanar CT image reconstructions and MIPs were obtained to evaluate the vascular anatomy. CONTRAST:  124mL OMNIPAQUE IOHEXOL 350 MG/ML SOLN COMPARISON:  None. FINDINGS: Cardiovascular: Large right-sided pulmonary emboli are noted. RV/LV ratio of 1.0 is noted suggesting right heart strain. Smaller filling defect or embolus is seen in left upper lobe branch. Mild cardiomegaly is noted. No pericardial effusion is noted. Mediastinum/Nodes: No adenopathy is noted. Esophagus is unremarkable. 2.6 cm left thyroid nodule is noted. Lungs/Pleura: No pneumothorax or pleural effusion is noted. Minimal left posterior basilar subsegmental atelectasis is noted. 2.7 x 2.1 cm rounded well-defined low density is noted in the left lower lobe with average Hounsfield measurement of 40. It is uncertain if this represents possible complex pericardial cyst or mass. Upper Abdomen: No acute abnormality. Musculoskeletal: No chest wall abnormality. No acute or significant osseous findings. Review of the MIP images confirms the above findings. IMPRESSION: 1. Large right-sided pulmonary emboli are noted with CT evidence of right heart strain (RV/LV Ratio = 1.0) consistent with at least submassive (intermediate risk) PE. The presence of right heart strain has been associated with an increased risk of morbidity and mortality. Critical Value/emergent results were called by telephone at the time of interpretation on 10/22/2019 at 1:44 pm to provider Dr. Kathrynn Humble, Who verbally acknowledged these results. 2. 2.6 cm left thyroid nodule is noted. Recommend thyroid US. (Ref: J Am Coll Radiol. 2015 Feb;12(2): 143-50). 3. 2.7 x 2.1 cm rounded well-defined low density is noted in the left lower lobe with average Hounsfield measurement of 40. It is uncertain if this represents possible complex pericardial cyst or mass. PET scan is recommended for further evaluation.  Electronically Signed   By: Marijo Conception M.D.   On: 10/22/2019 13:45   US Venous Img Lower Bilateral (DVT)  Result Date: 10/22/2019 CLINICAL DATA:  Pulmonary embolism EXAM: BILATERAL LOWER EXTREMITY VENOUS DOPPLER ULTRASOUND TECHNIQUE: Gray-scale sonography with graded compression, as well as color Doppler and duplex ultrasound were performed to evaluate the lower extremity deep venous systems from the level of the common femoral vein and including the common femoral, femoral, profunda femoral, popliteal and calf veins including the posterior tibial, peroneal and gastrocnemius veins when visible. The superficial great saphenous vein was also interrogated. Spectral Doppler was utilized to evaluate flow at rest and with distal augmentation maneuvers in the common femoral, femoral and popliteal veins. COMPARISON:  None. FINDINGS: RIGHT LOWER EXTREMITY Common Femoral Vein: No evidence of thrombus. Normal compressibility, respiratory phasicity and response to augmentation. Saphenofemoral Junction: No evidence of  thrombus. Normal compressibility and flow on color Doppler imaging. Profunda Femoral Vein: No evidence of thrombus. Normal compressibility and flow on color Doppler imaging. Femoral Vein: No evidence of thrombus. Normal compressibility, respiratory phasicity and response to augmentation. Popliteal Vein: No evidence of thrombus. Normal compressibility, respiratory phasicity and response to augmentation. Calf Veins: No evidence of thrombus. Normal compressibility and flow on color Doppler imaging. Superficial Great Saphenous Vein: No evidence of thrombus. Normal compressibility. Venous Reflux:  None. Other Findings:  None. LEFT LOWER EXTREMITY Common Femoral Vein: No evidence of thrombus. Normal compressibility, respiratory phasicity and response to augmentation. Saphenofemoral Junction: No evidence of thrombus. Normal compressibility and flow on color Doppler imaging. Profunda Femoral Vein: No evidence of  thrombus. Normal compressibility and flow on color Doppler imaging. Femoral Vein: No evidence of thrombus. Normal compressibility, respiratory phasicity and response to augmentation. Popliteal Vein: No evidence of thrombus. Normal compressibility, respiratory phasicity and response to augmentation. Calf Veins: No evidence of thrombus. Normal compressibility and flow on color Doppler imaging. Superficial Great Saphenous Vein: No evidence of thrombus. Normal compressibility. Venous Reflux:  None. Other Findings:  None. IMPRESSION: No evidence of deep venous thrombosis in either lower extremity. Electronically Signed   By: Franchot Gallo M.D.   On: 10/22/2019 15:53   DG Chest Port 1 View  Result Date: 10/22/2019 CLINICAL DATA:  Shortness of breath for 5 days. EXAM: PORTABLE CHEST 1 VIEW COMPARISON:  Radiographs 05/26/2011 and 03/15/2008. FINDINGS: 1042 hours. The heart size is stable at the upper limits of normal for portable AP technique. The mediastinal contours are normal. The pulmonary vascularity is normal, and the lungs remain clear. There is no pleural effusion or pneumothorax. No acute osseous findings are seen. There are degenerative changes throughout the spine and at both shoulders. Telemetry leads overlie the chest. IMPRESSION: No active cardiopulmonary process. Electronically Signed   By: Richardean Sale M.D.   On: 10/22/2019 10:50   ECHOCARDIOGRAM COMPLETE  Result Date: 10/22/2019    ECHOCARDIOGRAM REPORT   Patient Name:   LOURIE RETZ [facility] Date of Exam: 10/22/2019 Medical Rec #:  [id number]       Height:       66.0 in Accession #:    [id number]      Weight:       230.0 lb Date of Birth:  10-12-1950      BSA:          2.122 m Patient Age:    32 years        BP:           150/84 mmHg Patient Gender: F               HR:           101 bpm. Exam Location:  [facility] Procedure: 2D Echo, Cardiac Doppler and Color Doppler Indications:    Pulmonary Embolus 415.19 / I26.99  History:        Patient has no  prior history of Echocardiogram examinations.                 Risk Factors:Diabetes, Dyslipidemia and Hypertension. Acute                 respiratory failure with hypoxia,CKD (chronic kidney disease)                 stage 4,Morbid obesity,Pulmonary embolism.  Sonographer:    Alvino Chapel RCS Referring Phys: [phone] Hilmer Aliberti IMPRESSIONS  1. Left ventricular ejection fraction, by  estimation, is 65 to 70%. The left ventricle has normal function. The left ventricle has no regional wall motion abnormalities. There is mild left ventricular hypertrophy.  2. Right ventricular systolic function is mildly reduced. The right ventricular size is mildly enlarged.  3. Right atrial size was mildly dilated.  4. The mitral valve is normal in structure. No evidence of mitral valve regurgitation.  5. The aortic valve is tricuspid. Aortic valve regurgitation is not visualized. Mild aortic valve sclerosis is present, with no evidence of aortic valve stenosis.  6. The inferior vena cava is normal in size with greater than 50% respiratory variability, suggesting right atrial pressure of 3 mmHg. FINDINGS  Left Ventricle: Left ventricular ejection fraction, by estimation, is 65 to 70%. The left ventricle has normal function. The left ventricle has no regional wall motion abnormalities. The left ventricular internal cavity size was normal in size. There is  mild left ventricular hypertrophy. Left ventricular diastolic function could not be evaluated. Right Ventricle: The right ventricular size is mildly enlarged. Right vetricular wall thickness was not assessed. Right ventricular systolic function is mildly reduced. Left Atrium: Left atrial size was normal in size. Right Atrium: Right atrial size was mildly dilated. Pericardium: There is no evidence of pericardial effusion. Mitral Valve: The mitral valve is normal in structure. No evidence of mitral valve regurgitation. Tricuspid Valve: The tricuspid valve is normal in structure.  Tricuspid valve regurgitation is trivial. Aortic Valve: The aortic valve is tricuspid. Aortic valve regurgitation is not visualized. Mild aortic valve sclerosis is present, with no evidence of aortic valve stenosis. Pulmonic Valve: The pulmonic valve was grossly normal. Pulmonic valve regurgitation is not visualized. Aorta: The aortic root is normal in size and structure. Venous: The inferior vena cava is normal in size with greater than 50% respiratory variability, suggesting right atrial pressure of 3 mmHg. IAS/Shunts: No atrial level shunt detected by color flow Doppler.  LEFT VENTRICLE PLAX 2D LVIDd:         3.43 cm  Diastology LVIDs:         2.14 cm  LV e' lateral:   7.62 cm/s LV PW:         1.15 cm  LV E/e' lateral: 10.2 LV IVS:        1.31 cm  LV e' medial:    6.74 cm/s LVOT diam:     1.90 cm  LV E/e' medial:  11.6 LV SV:         48 LV SV Index:   23 LVOT Area:     2.84 cm  RIGHT VENTRICLE RV S prime:     13.20 cm/s TAPSE (M-mode): 1.9 cm LEFT ATRIUM             Index       RIGHT ATRIUM           Index LA diam:        2.60 cm 1.23 cm/m  RA Area:     13.90 cm LA Vol (A2C):   30.0 ml 14.14 ml/m RA Volume:   29.80 ml  14.04 ml/m LA Vol (A4C):   29.8 ml 14.04 ml/m LA Biplane Vol: 29.8 ml 14.04 ml/m  AORTIC VALVE LVOT Vmax:   128.00 cm/s LVOT Vmean:  78.900 cm/s LVOT VTI:    0.171 m  AORTA Ao Root diam: 3.10 cm MITRAL VALVE MV Area (PHT): 2.54 cm     SHUNTS MV Decel Time: 299 msec     Systemic VTI:  0.17  m MV E velocity: 78.10 cm/s   Systemic Diam: 1.90 cm MV A velocity: 108.00 cm/s MV E/A ratio:  0.72 Dorris Carnes MD Electronically signed by Dorris Carnes MD Signature Date/Time: 10/22/2019/4:36:41 PM    Final    US THYROID  Result Date: 10/23/2019 CLINICAL DATA:  Thyroid nodule by CT EXAM: THYROID ULTRASOUND TECHNIQUE: Ultrasound examination of the thyroid gland and adjacent soft tissues was performed. COMPARISON:  10/22/2019 FINDINGS: Parenchymal Echotexture: Moderately heterogenous Isthmus: 4 mm Right  lobe: 5.3 x 2.9 x 1.7 cm Left lobe: 5.6 x 3.4 x 2.7 cm _________________________________________________________ Estimated total number of nodules >/= 1 cm: 1 Number of spongiform nodules >/=  2 cm not described below (TR1): 0 Number of mixed cystic and solid nodules >/= 1.5 cm not described below (TR2): 0 _________________________________________________________ Nodule # 1: Location: Left; Inferior Maximum size: 2.6 cm; Other 2 dimensions: 2.2 x 2.0 cm Composition: solid/almost completely solid (2) Echogenicity: hypoechoic (2) Shape: not taller-than-wide (0) Margins: ill-defined (0) Echogenic foci: none (0) ACR TI-RADS total points: 4. ACR TI-RADS risk category: TR4 (4-6 points). ACR TI-RADS recommendations: **Given size (>/= 1.5 cm) and appearance, fine needle aspiration of this moderately suspicious nodule should be considered based on TI-RADS criteria. _________________________________________________________ Nonspecific moderate thyroid heterogeneity. Limited exam because of body habitus. No significant right thyroid abnormality. No hypervascularity or regional adenopathy. IMPRESSION: 2.6 cm left inferior TR 4 nodule meets criteria for biopsy as above. This correlates with the CT finding. The above is in keeping with the ACR TI-RADS recommendations - J Am Coll Radiol 2017;14:587-595. Electronically Signed   By: Jerilynn Mages.  Shick M.D.   On: 10/23/2019 09:37     CBC Recent Labs  Lab 10/22/19 1034 10/23/19 0433 10/23/19 0723  WBC 7.5 8.5 7.7  HGB 11.6* 11.3* 11.5*  HCT 37.9 36.5 37.3  PLT 257 264 254  MCV 85.4 84.9 85.4  MCH 26.1 26.3 26.3  MCHC 30.6 31.0 30.8  RDW 14.6 14.6 14.6  LYMPHSABS 1.8  --   --   MONOABS 0.6  --   --   EOSABS 0.1  --   --   BASOSABS 0.0  --   --     Chemistries  Recent Labs  Lab 10/22/19 1034 10/23/19 0723  NA 136 137  K 4.3 4.5  CL 102 103  CO2 23 25  GLUCOSE 97 141*  BUN 26* 17  CREATININE 1.86* 1.56*  CALCIUM 8.8* 8.6*    ------------------------------------------------------------------------------------------------------------------ No results for input(s): CHOL, HDL, LDLCALC, TRIG, CHOLHDL, LDLDIRECT in the last 72 hours.  Lab Results  Component Value Date   HGBA1C 7.6 (H) 10/22/2019   ------------------------------------------------------------------------------------------------------------------ Recent Labs    10/22/19 2230  TSH 1.110   ------------------------------------------------------------------------------------------------------------------ No results for input(s): VITAMINB12, FOLATE, FERRITIN, TIBC, IRON, RETICCTPCT in the last 72 hours.  Coagulation profile No results for input(s): INR, PROTIME in the last 168 hours.  Recent Labs    10/22/19 1034  DDIMER 19.18*    Cardiac Enzymes No results for input(s): CKMB, TROPONINI, MYOGLOBIN in the last 168 hours.  Invalid input(s): CK ------------------------------------------------------------------------------------------------------------------    Component Value Date/Time   BNP 159.0 (H) 10/22/2019 1034     Roxan Hockey M.D on 10/23/2019 at 7:39 PM  Go to [email or web address] - for contact info  [facility] - Office  [phone]

## 2019-10-24 ENCOUNTER — Encounter (HOSPITAL_COMMUNITY): Payer: Self-pay | Admitting: Family Medicine

## 2019-10-24 LAB — GLUCOSE, CAPILLARY
Glucose-Capillary: 61 mg/dL — ABNORMAL LOW (ref 70–99)
Glucose-Capillary: 119 mg/dL — ABNORMAL HIGH (ref 70–99)
Glucose-Capillary: 169 mg/dL — ABNORMAL HIGH (ref 70–99)
Glucose-Capillary: 136 mg/dL — ABNORMAL HIGH (ref 70–99)

## 2019-10-24 LAB — RENAL FUNCTION PANEL
Albumin: 3.4 g/dL — ABNORMAL LOW (ref 3.5–5.0)
Anion gap: 9 (ref 5–15)
BUN: 21 mg/dL (ref 8–23)
CO2: 25 mmol/L (ref 22–32)
Calcium: 9 mg/dL (ref 8.9–10.3)
Chloride: 101 mmol/L (ref 98–111)
Creatinine, Ser: 1.67 mg/dL — ABNORMAL HIGH (ref 0.44–1.00)
GFR calc Af Amer: 36 mL/min — ABNORMAL LOW (ref >60)
GFR calc non Af Amer: 31 mL/min — ABNORMAL LOW (ref >60)
Glucose, Bld: 116 mg/dL — ABNORMAL HIGH (ref 70–99)
Phosphorus: 3.7 mg/dL (ref 2.5–4.6)
Potassium: 4.7 mmol/L (ref 3.5–5.1)
Sodium: 135 mmol/L (ref 135–145)

## 2019-10-24 LAB — CBC
HCT: 36 % (ref 36.0–46.0)
Hemoglobin: 10.9 g/dL — ABNORMAL LOW (ref 12.0–15.0)
MCH: 25.8 pg — ABNORMAL LOW (ref 26.0–34.0)
MCHC: 30.3 g/dL (ref 30.0–36.0)
MCV: 85.1 fL (ref 80.0–100.0)
Platelets: 260 10*3/uL (ref 150–400)
RBC: 4.23 MIL/uL (ref 3.87–5.11)
RDW: 14.5 % (ref 11.5–15.5)
WBC: 8.2 10*3/uL (ref 4.0–10.5)
nRBC: 0 % (ref 0.0–0.2)

## 2019-10-24 LAB — HEPARIN LEVEL (UNFRACTIONATED): Heparin Unfractionated: 0.48 IU/mL (ref 0.30–0.70)

## 2019-10-24 NOTE — Progress Notes (Signed)
    SATURATION QUALIFICATIONS: (Thisnote is usedto comply with regulatory documentation for home oxygen)  Patient Saturations on Room Air at Rest =88%  Patient Saturations on Room Air while Ambulating =84 %  Patient Saturations on2Liters of oxygen while Ambulating = 93%  Patient needs continuous O2 at 2 L/min continuously via nasal cannula with humidifier, with gaseous portability and conserving device    - Roxan Hockey, MD

## 2019-10-24 NOTE — Progress Notes (Signed)
ANTICOAGULATION CONSULT NOTE -   Pharmacy Consult for heparin Indication: pulmonary embolus  No Known Allergies  Patient Measurements: Height: 5\' 6"  (167.6 cm) Weight: 104.4 kg (230 lb 2.6 oz) IBW/kg (Calculated) : 59.3 HEPARIN DW (KG): 83.2  Vital Signs: Temp: 98 F (36.7 C) (06/16 0400) Temp Source: Oral (06/15 2000) BP: 132/58 (06/16 0700) Pulse Rate: 81 (06/16 0700)  Labs: Recent Labs    10/22/19 1034 10/22/19 1034 10/22/19 1441 10/22/19 1531 10/22/19 2230 10/23/19 0433 10/23/19 0433 10/23/19 0723 10/24/19 0440  HGB 11.6*   < >  --   --   --  11.3*   < > 11.5* 10.9*  HCT 37.9   < >  --   --   --  36.5  --  37.3 36.0  PLT 257   < >  --   --   --  264  --  254 260  APTT  --   --   --  28  --   --   --   --   --   HEPARINUNFRC  --   --   --   --  0.48 0.47  --   --  0.48  CREATININE 1.86*  --   --   --   --   --   --  1.56*  --   TROPONINIHS 47*  --  42*  --   --   --   --   --   --    < > = values in this interval not displayed.    Estimated Creatinine Clearance: 40.9 mL/min (A) (by C-G formula based on SCr of 1.56 mg/dL (H)).   Medical History: Past Medical History:  Diagnosis Date  . Anemia   . Arthritis   . Chronic kidney disease   . Diabetes mellitus type II   . Dysphagia    unspecified   . GERD (gastroesophageal reflux disease)   . Hyperlipidemia 2000  . Hypertension 1995  . Insomnia   . Low back pain   . Obesity     Medications:  See med rec  Assessment: Patient presents with shortness of breath with exertion which started 5 days ago shortly after [place] with [place] . CT imaging also positive for a left lobe cyst versus pericardial mass. CT Shows e/o right heart strain consistent with at least submassive PE. Pharmacy asked to start heparin.  Heparin levels therapeutic 0.48  Goal of Therapy:  Heparin level 0.3-0.7 units/ml Monitor platelets by anticoagulation protocol: Yes   Plan:  Continue heparin  infusion at 1300 units/hr Check anti-Xa level daily while on heparin Continue to monitor H&H and platelets F/U plan  Margot Ables, PharmD Clinical Pharmacist 10/24/2019 7:48 AM

## 2019-10-24 NOTE — Progress Notes (Signed)
Patient Demographics:    Jenisis Harmsen, is a 69 y.o. female, DOB - 04/27/1951, BOF:[id number]  Admit date - 10/22/2019   Admitting Physician Keyara Ent Denton Brick, MD  Outpatient Primary MD for the patient is Fayrene Helper, MD  LOS - 2   Chief Complaint  Patient presents with  . 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,  No chest pain,  --Dyspnea on exertion but no shortness of breath at rest -Hypoxia persist , unable to wean off oxygen at this time -De-Saturations when she falls asleep suggesting possible OSA  Assessment  & Plan :    Principal Problem:   Pulmonary embolism (HCC) Active Problems:   Acute respiratory failure with hypoxia (HCC)   CKD (chronic kidney disease), stage IIIb   Type 2 diabetes mellitus with vascular disease ([place])   Morbid obesity (HCC)   Essential hypertension   GERD   1)Acute Bil PE----large right-sided submassive PE  and small left upper lobe PE--- Echo with preserved EF of 65 to 70% with no significant right heart strain on echo -LE venous Dopplers without DVT -Patient may benefit from abdominal pelvic imaging -Patient's SHOB and DOE started prior to trip to the beach, symptoms worsened immediately after she arrived at the beach hotel could even walk up from the elevator to her hotel room --This PE may have been present prior to patient's for a trip to the beach symptoms only worsened on that particular day rather than started that day -Patient will need malignancy work-up as part of work-up of a hypercoagulable state -Last colonoscopy more than 10 years ago apparently,  last mammogram 2021 -CTA chest also showed 2.7 x 2.1 cm rounded well-defined low density is noted in the left lower lobe --patient will need outpatient PET scan -Now on IV heparin drip may consider transition to [place] on 10/25/19 --Hypoxia persist ,  unable to wean off oxygen at this time  2)CKD IIIb--Creatinine is down to 1.67 from 1.86 on admission --recent baseline usually around 1.5-1.6 -patient had contrast/CTA study so continue to hold Aldactone/hctz, hold ramipril --Patient is at risk for worsening renal function due to contrast exposure -- renally adjust medications, avoid nephrotoxic agents / dehydration  / hypotension  - 3)DM2- A1c 8.3 , reflecting uncontrolled diabetes PTA had hypoglycemic episode in the ED, -PTA was on Lantus 60 mg twice daily, changed to 40 mg daily for now along with sliding scale coverage  4)HTN-continue amlodipine and clonidine ---hold ramipril and Aldactone/HCTZ due to kidney concerns -IV hydralazine as needed elevated BP  5)HLD--stable continue pravastatin and aspirin  6) obesity and osteoarthritis----outpatient management  as advised  7) acute hypoxic respiratory failure secondary to pulmonary embolism --- secondary to #1 above, manage as above #1 --Hypoxia persist , unable to wean off oxygen at this time  8)-De-Saturations when she falls asleep suggesting possible OSA=--outpatient sleep study advised  9)Thyroid Nodule---- 2.6 cm left inferior TR 4 nodule meets criteria for biopsy as above. This correlates with the CT finding. -Patient will need outpatient thyroid biopsy   Status is: Inpatient  Remains inpatient appropriate because:IV treatments appropriate due to intensity of illness or inability to take PO and Inpatient  level of care appropriate due to severity of illness   Dispo: The patient is from: Home  Anticipated d/c is to: with HH Vs SNF  Anticipated d/c date is: > 3 days  Patient currently is not medically stable to d/c. Barriers: Not Clinically Stable- -acute hypoxic respiratory failure secondary to PE requiring IV heparin and oxygen supplementation   Code Status : full  Family Communication:  (patient is alert, awake and  coherent) --discussed with patient's sister [name]  Consults  :  PCCM  DVT Prophylaxis  : IV heparin   Lab Results  Component Value Date   PLT 260 10/24/2019    Inpatient Medications  Scheduled Meds: . amLODipine  10 mg Oral Daily  . Chlorhexidine Gluconate Cloth  6 each Topical Daily  . cholecalciferol  1,000 Units Oral Daily  . cloNIDine  0.3 mg Oral TID  . insulin aspart  0-10 Units Subcutaneous TID WC  . insulin aspart  0-5 Units Subcutaneous QHS  . insulin aspart  4 Units Subcutaneous TID WC  . insulin glargine  40 Units Subcutaneous Daily  . mouth rinse  15 mL Mouth Rinse BID  . pantoprazole  40 mg Oral Daily  . polyethylene glycol  17 g Oral Daily  . pravastatin  40 mg Oral Daily  . senna-docusate  2 tablet Oral BID  . sodium chloride flush  3 mL Intravenous Q12H   Continuous Infusions: . sodium chloride    . heparin 1,300 Units/hr (10/24/19 0624)   PRN Meds:.sodium chloride, acetaminophen **OR** acetaminophen, albuterol, cloNIDine, fentaNYL (SUBLIMAZE) injection, hydrALAZINE, ondansetron **OR** ondansetron (ZOFRAN) IV, oxyCODONE, sodium chloride flush, traZODone    Anti-infectives (From admission, onward)   None        Objective:   Vitals:   10/24/19 1200 10/24/19 1230 10/24/19 1300 10/24/19 1400  BP: (!) 133/54 (!) 126/96 (!) 125/56 126/67  Pulse: 94 86 91 83  Resp: (!) 21 (!) 26 17 (!) 29  Temp:      TempSrc:      SpO2: 96% 96% 98% 95%  Weight:      Height:        Wt Readings from Last 3 Encounters:  10/23/19 104.4 kg  09/03/19 105.9 kg  05/21/19 105.2 kg     Intake/Output Summary (Last 24 hours) at 10/24/2019 1501 Last data filed at 10/24/2019 1146 Gross per 24 hour  Intake 521.62 ml  Output 900 ml  Net -378.38 ml     Physical Exam  Gen:- Awake Alert, speaking in short sentences HEENT:- [place].AT, No sclera icterus Neck-Supple Neck,No JVD,.  Lungs-diminished in bases without wheezing CV- S1, S2 normal, regular  Abd-  +ve B.Sounds, Abd  Soft, No tenderness,    Extremity/Skin:- No significant edema, pedal pulses present  Psych-affect is appropriate, oriented x3 Neuro-generalized weakness, no new focal deficits, no tremors   Data Review:   Micro Results Recent Results (from the past 240 hour(s))  SARS Coronavirus 2 by RT PCR (hospital order, performed in [facility] hospital lab) Nasopharyngeal Nasopharyngeal Swab     Status: None   Collection Time: 10/22/19 11:17 AM   Specimen: Nasopharyngeal Swab  Result Value Ref Range Status   SARS Coronavirus 2 NEGATIVE NEGATIVE Final    Comment: (NOTE) SARS-CoV-2 target nucleic acids are NOT DETECTED.  The SARS-CoV-2 RNA is generally detectable in upper and lower respiratory specimens during the acute phase of infection. The lowest concentration of SARS-CoV-2 viral copies this assay can detect is 250 copies / mL. A  negative result does not preclude SARS-CoV-2 infection and should not be used as the sole basis for treatment or other patient management decisions.  A negative result may occur with improper specimen collection / handling, submission of specimen other than nasopharyngeal swab, presence of viral mutation(s) within the areas targeted by this assay, and inadequate number of viral copies (<250 copies / mL). A negative result must be combined with clinical observations, patient history, and epidemiological information.  Fact Sheet for Patients:   StrictlyIdeas.no  Fact Sheet for Healthcare Providers: BankingDealers.co.za  This test is not yet approved or  cleared by the [place] FDA and has been authorized for detection and/or diagnosis of SARS-CoV-2 by FDA under an Emergency Use Authorization (EUA).  This EUA will remain in effect (meaning this test can be used) for the duration of the COVID-19 declaration under Section 564(b)(1) of the Act, 21 U.S.C. section 360bbb-3(b)(1), unless the authorization is terminated  or revoked sooner.  Performed at [facility], [address] [place], [place] [address]   MRSA PCR Screening     Status: None   Collection Time: 10/23/19  2:49 PM   Specimen: Nasal Mucosa; Nasopharyngeal  Result Value Ref Range Status   MRSA by PCR NEGATIVE NEGATIVE Final    Comment:        The GeneXpert MRSA Assay (FDA approved for NASAL specimens only), is one component of a comprehensive MRSA colonization surveillance program. It is not intended to diagnose MRSA infection nor to guide or monitor treatment for MRSA infections. Performed at [facility], [address] [place], [place] [address]     Radiology Reports CT Angio Chest PE W and/or Wo Contrast  Result Date: 10/22/2019 CLINICAL DATA:  Shortness of breath. EXAM: CT ANGIOGRAPHY CHEST WITH CONTRAST TECHNIQUE: Multidetector CT imaging of the chest was performed using the standard protocol during bolus administration of intravenous contrast. Multiplanar CT image reconstructions and MIPs were obtained to evaluate the vascular anatomy. CONTRAST:  166mL OMNIPAQUE IOHEXOL 350 MG/ML SOLN COMPARISON:  None. FINDINGS: Cardiovascular: Large right-sided pulmonary emboli are noted. RV/LV ratio of 1.0 is noted suggesting right heart strain. Smaller filling defect or embolus is seen in left upper lobe branch. Mild cardiomegaly is noted. No pericardial effusion is noted. Mediastinum/Nodes: No adenopathy is noted. Esophagus is unremarkable. 2.6 cm left thyroid nodule is noted. Lungs/Pleura: No pneumothorax or pleural effusion is noted. Minimal left posterior basilar subsegmental atelectasis is noted. 2.7 x 2.1 cm rounded well-defined low density is noted in the left lower lobe with average Hounsfield measurement of 40. It is uncertain if this represents possible complex pericardial cyst or mass. Upper Abdomen: No acute abnormality. Musculoskeletal: No chest wall abnormality. No acute or significant osseous findings. Review of the MIP images  confirms the above findings. IMPRESSION: 1. Large right-sided pulmonary emboli are noted with CT evidence of right heart strain (RV/LV Ratio = 1.0) consistent with at least submassive (intermediate risk) PE. The presence of right heart strain has been associated with an increased risk of morbidity and mortality. Critical Value/emergent results were called by telephone at the time of interpretation on 10/22/2019 at 1:44 pm to provider Dr. Kathrynn Humble, Who verbally acknowledged these results. 2. 2.6 cm left thyroid nodule is noted. Recommend thyroid US. (Ref: J Am Coll Radiol. 2015 Feb;12(2): 143-50). 3. 2.7 x 2.1 cm rounded well-defined low density is noted in the left lower lobe with average Hounsfield measurement of 40. It is uncertain if this represents possible complex pericardial cyst or mass. PET  scan is recommended for further evaluation. Electronically Signed   By: Marijo Conception M.D.   On: 10/22/2019 13:45   US Venous Img Lower Bilateral (DVT)  Result Date: 10/22/2019 CLINICAL DATA:  Pulmonary embolism EXAM: BILATERAL LOWER EXTREMITY VENOUS DOPPLER ULTRASOUND TECHNIQUE: Gray-scale sonography with graded compression, as well as color Doppler and duplex ultrasound were performed to evaluate the lower extremity deep venous systems from the level of the common femoral vein and including the common femoral, femoral, profunda femoral, popliteal and calf veins including the posterior tibial, peroneal and gastrocnemius veins when visible. The superficial great saphenous vein was also interrogated. Spectral Doppler was utilized to evaluate flow at rest and with distal augmentation maneuvers in the common femoral, femoral and popliteal veins. COMPARISON:  None. FINDINGS: RIGHT LOWER EXTREMITY Common Femoral Vein: No evidence of thrombus. Normal compressibility, respiratory phasicity and response to augmentation. Saphenofemoral Junction: No evidence of thrombus. Normal compressibility and flow on color Doppler  imaging. Profunda Femoral Vein: No evidence of thrombus. Normal compressibility and flow on color Doppler imaging. Femoral Vein: No evidence of thrombus. Normal compressibility, respiratory phasicity and response to augmentation. Popliteal Vein: No evidence of thrombus. Normal compressibility, respiratory phasicity and response to augmentation. Calf Veins: No evidence of thrombus. Normal compressibility and flow on color Doppler imaging. Superficial Great Saphenous Vein: No evidence of thrombus. Normal compressibility. Venous Reflux:  None. Other Findings:  None. LEFT LOWER EXTREMITY Common Femoral Vein: No evidence of thrombus. Normal compressibility, respiratory phasicity and response to augmentation. Saphenofemoral Junction: No evidence of thrombus. Normal compressibility and flow on color Doppler imaging. Profunda Femoral Vein: No evidence of thrombus. Normal compressibility and flow on color Doppler imaging. Femoral Vein: No evidence of thrombus. Normal compressibility, respiratory phasicity and response to augmentation. Popliteal Vein: No evidence of thrombus. Normal compressibility, respiratory phasicity and response to augmentation. Calf Veins: No evidence of thrombus. Normal compressibility and flow on color Doppler imaging. Superficial Great Saphenous Vein: No evidence of thrombus. Normal compressibility. Venous Reflux:  None. Other Findings:  None. IMPRESSION: No evidence of deep venous thrombosis in either lower extremity. Electronically Signed   By: Franchot Gallo M.D.   On: 10/22/2019 15:53   DG Chest Port 1 View  Result Date: 10/22/2019 CLINICAL DATA:  Shortness of breath for 5 days. EXAM: PORTABLE CHEST 1 VIEW COMPARISON:  Radiographs 05/26/2011 and 03/15/2008. FINDINGS: 1042 hours. The heart size is stable at the upper limits of normal for portable AP technique. The mediastinal contours are normal. The pulmonary vascularity is normal, and the lungs remain clear. There is no pleural effusion or  pneumothorax. No acute osseous findings are seen. There are degenerative changes throughout the spine and at both shoulders. Telemetry leads overlie the chest. IMPRESSION: No active cardiopulmonary process. Electronically Signed   By: Richardean Sale M.D.   On: 10/22/2019 10:50   ECHOCARDIOGRAM COMPLETE  Result Date: 10/22/2019    ECHOCARDIOGRAM REPORT   Patient Name:   PEOLA JOYNT [facility] Date of Exam: 10/22/2019 Medical Rec #:  [id number]       Height:       66.0 in Accession #:    [id number]      Weight:       230.0 lb Date of Birth:  [birth year]      BSA:          2.122 m Patient Age:    46 years        BP:           150/84  mmHg Patient Gender: F               HR:           101 bpm. Exam Location:  [facility] Procedure: 2D Echo, Cardiac Doppler and Color Doppler Indications:    Pulmonary Embolus 415.19 / I26.99  History:        Patient has no prior history of Echocardiogram examinations.                 Risk Factors:Diabetes, Dyslipidemia and Hypertension. Acute                 respiratory failure with hypoxia,CKD (chronic kidney disease)                 stage 4,Morbid obesity,Pulmonary embolism.  Sonographer:    Alvino Chapel RCS Referring Phys: [phone] Vickki Igou IMPRESSIONS  1. Left ventricular ejection fraction, by estimation, is 65 to 70%. The left ventricle has normal function. The left ventricle has no regional wall motion abnormalities. There is mild left ventricular hypertrophy.  2. Right ventricular systolic function is mildly reduced. The right ventricular size is mildly enlarged.  3. Right atrial size was mildly dilated.  4. The mitral valve is normal in structure. No evidence of mitral valve regurgitation.  5. The aortic valve is tricuspid. Aortic valve regurgitation is not visualized. Mild aortic valve sclerosis is present, with no evidence of aortic valve stenosis.  6. The inferior vena cava is normal in size with greater than 50% respiratory variability, suggesting right atrial pressure of 3  mmHg. FINDINGS  Left Ventricle: Left ventricular ejection fraction, by estimation, is 65 to 70%. The left ventricle has normal function. The left ventricle has no regional wall motion abnormalities. The left ventricular internal cavity size was normal in size. There is  mild left ventricular hypertrophy. Left ventricular diastolic function could not be evaluated. Right Ventricle: The right ventricular size is mildly enlarged. Right vetricular wall thickness was not assessed. Right ventricular systolic function is mildly reduced. Left Atrium: Left atrial size was normal in size. Right Atrium: Right atrial size was mildly dilated. Pericardium: There is no evidence of pericardial effusion. Mitral Valve: The mitral valve is normal in structure. No evidence of mitral valve regurgitation. Tricuspid Valve: The tricuspid valve is normal in structure. Tricuspid valve regurgitation is trivial. Aortic Valve: The aortic valve is tricuspid. Aortic valve regurgitation is not visualized. Mild aortic valve sclerosis is present, with no evidence of aortic valve stenosis. Pulmonic Valve: The pulmonic valve was grossly normal. Pulmonic valve regurgitation is not visualized. Aorta: The aortic root is normal in size and structure. Venous: The inferior vena cava is normal in size with greater than 50% respiratory variability, suggesting right atrial pressure of 3 mmHg. IAS/Shunts: No atrial level shunt detected by color flow Doppler.  LEFT VENTRICLE PLAX 2D LVIDd:         3.43 cm  Diastology LVIDs:         2.14 cm  LV e' lateral:   7.62 cm/s LV PW:         1.15 cm  LV E/e' lateral: 10.2 LV IVS:        1.31 cm  LV e' medial:    6.74 cm/s LVOT diam:     1.90 cm  LV E/e' medial:  11.6 LV SV:         48 LV SV Index:   23 LVOT Area:     2.84 cm  RIGHT VENTRICLE  RV S prime:     13.20 cm/s TAPSE (M-mode): 1.9 cm LEFT ATRIUM             Index       RIGHT ATRIUM           Index LA diam:        2.60 cm 1.23 cm/m  RA Area:     13.90 cm LA Vol  (A2C):   30.0 ml 14.14 ml/m RA Volume:   29.80 ml  14.04 ml/m LA Vol (A4C):   29.8 ml 14.04 ml/m LA Biplane Vol: 29.8 ml 14.04 ml/m  AORTIC VALVE LVOT Vmax:   128.00 cm/s LVOT Vmean:  78.900 cm/s LVOT VTI:    0.171 m  AORTA Ao Root diam: 3.10 cm MITRAL VALVE MV Area (PHT): 2.54 cm     SHUNTS MV Decel Time: 299 msec     Systemic VTI:  0.17 m MV E velocity: 78.10 cm/s   Systemic Diam: 1.90 cm MV A velocity: 108.00 cm/s MV E/A ratio:  0.72 Dorris Carnes MD Electronically signed by Dorris Carnes MD Signature Date/Time: 10/22/2019/4:36:41 PM    Final    US THYROID  Result Date: 10/23/2019 CLINICAL DATA:  Thyroid nodule by CT EXAM: THYROID ULTRASOUND TECHNIQUE: Ultrasound examination of the thyroid gland and adjacent soft tissues was performed. COMPARISON:  10/22/2019 FINDINGS: Parenchymal Echotexture: Moderately heterogenous Isthmus: 4 mm Right lobe: 5.3 x 2.9 x 1.7 cm Left lobe: 5.6 x 3.4 x 2.7 cm _________________________________________________________ Estimated total number of nodules >/= 1 cm: 1 Number of spongiform nodules >/=  2 cm not described below (TR1): 0 Number of mixed cystic and solid nodules >/= 1.5 cm not described below (TR2): 0 _________________________________________________________ Nodule # 1: Location: Left; Inferior Maximum size: 2.6 cm; Other 2 dimensions: 2.2 x 2.0 cm Composition: solid/almost completely solid (2) Echogenicity: hypoechoic (2) Shape: not taller-than-wide (0) Margins: ill-defined (0) Echogenic foci: none (0) ACR TI-RADS total points: 4. ACR TI-RADS risk category: TR4 (4-6 points). ACR TI-RADS recommendations: **Given size (>/= 1.5 cm) and appearance, fine needle aspiration of this moderately suspicious nodule should be considered based on TI-RADS criteria. _________________________________________________________ Nonspecific moderate thyroid heterogeneity. Limited exam because of body habitus. No significant right thyroid abnormality. No hypervascularity or regional adenopathy.  IMPRESSION: 2.6 cm left inferior TR 4 nodule meets criteria for biopsy as above. This correlates with the CT finding. The above is in keeping with the ACR TI-RADS recommendations - J Am Coll Radiol 2017;14:587-595. Electronically Signed   By: Jerilynn Mages.  Shick M.D.   On: 10/23/2019 09:37     CBC Recent Labs  Lab 10/22/19 1034 10/23/19 0433 10/23/19 0723 10/24/19 0440  WBC 7.5 8.5 7.7 8.2  HGB 11.6* 11.3* 11.5* 10.9*  HCT 37.9 36.5 37.3 36.0  PLT 257 264 254 260  MCV 85.4 84.9 85.4 85.1  MCH 26.1 26.3 26.3 25.8*  MCHC 30.6 31.0 30.8 30.3  RDW 14.6 14.6 14.6 14.5  LYMPHSABS 1.8  --   --   --   MONOABS 0.6  --   --   --   EOSABS 0.1  --   --   --   BASOSABS 0.0  --   --   --     Chemistries  Recent Labs  Lab 10/22/19 1034 10/23/19 0723 10/24/19 0836  NA 136 137 135  K 4.3 4.5 4.7  CL 102 103 101  CO2 23 25 25   GLUCOSE 97 141* 116*  BUN 26* 17 21  CREATININE 1.86*  1.56* 1.67*  CALCIUM 8.8* 8.6* 9.0   ------------------------------------------------------------------------------------------------------------------ No results for input(s): CHOL, HDL, LDLCALC, TRIG, CHOLHDL, LDLDIRECT in the last 72 hours.  Lab Results  Component Value Date   HGBA1C 7.6 (H) 10/22/2019   ------------------------------------------------------------------------------------------------------------------ Recent Labs    10/22/19 2230  TSH 1.110   ------------------------------------------------------------------------------------------------------------------ No results for input(s): VITAMINB12, FOLATE, FERRITIN, TIBC, IRON, RETICCTPCT in the last 72 hours.  Coagulation profile No results for input(s): INR, PROTIME in the last 168 hours.  Recent Labs    10/22/19 1034  DDIMER 19.18*    Cardiac Enzymes No results for input(s): CKMB, TROPONINI, MYOGLOBIN in the last 168 hours.  Invalid input(s):  CK ------------------------------------------------------------------------------------------------------------------    Component Value Date/Time   BNP 159.0 (H) 10/22/2019 1034     Roxan Hockey M.D on 10/24/2019 at 3:01 PM  Go to [email or web address] - for contact info  [facility] - Office  [phone]

## 2019-10-25 LAB — GLUCOSE, CAPILLARY
Glucose-Capillary: 108 mg/dL — ABNORMAL HIGH (ref 70–99)
Glucose-Capillary: 162 mg/dL — ABNORMAL HIGH (ref 70–99)
Glucose-Capillary: 129 mg/dL — ABNORMAL HIGH (ref 70–99)
Glucose-Capillary: 156 mg/dL — ABNORMAL HIGH (ref 70–99)

## 2019-10-25 LAB — CBC
HCT: 35.2 % — ABNORMAL LOW (ref 36.0–46.0)
Hemoglobin: 10.7 g/dL — ABNORMAL LOW (ref 12.0–15.0)
MCH: 26 pg (ref 26.0–34.0)
MCHC: 30.4 g/dL (ref 30.0–36.0)
MCV: 85.6 fL (ref 80.0–100.0)
Platelets: 249 10*3/uL (ref 150–400)
RBC: 4.11 MIL/uL (ref 3.87–5.11)
RDW: 14.3 % (ref 11.5–15.5)
WBC: 7.9 10*3/uL (ref 4.0–10.5)
nRBC: 0 % (ref 0.0–0.2)

## 2019-10-25 LAB — BASIC METABOLIC PANEL
Anion gap: 9 (ref 5–15)
BUN: 24 mg/dL — ABNORMAL HIGH (ref 8–23)
CO2: 24 mmol/L (ref 22–32)
Calcium: 8.8 mg/dL — ABNORMAL LOW (ref 8.9–10.3)
Chloride: 101 mmol/L (ref 98–111)
Creatinine, Ser: 1.66 mg/dL — ABNORMAL HIGH (ref 0.44–1.00)
GFR calc Af Amer: 36 mL/min — ABNORMAL LOW (ref >60)
GFR calc non Af Amer: 31 mL/min — ABNORMAL LOW (ref >60)
Glucose, Bld: 126 mg/dL — ABNORMAL HIGH (ref 70–99)
Potassium: 4.7 mmol/L (ref 3.5–5.1)
Sodium: 134 mmol/L — ABNORMAL LOW (ref 135–145)

## 2019-10-25 LAB — HEPARIN LEVEL (UNFRACTIONATED): Heparin Unfractionated: 0.42 IU/mL (ref 0.30–0.70)

## 2019-10-25 MED ORDER — APIXABAN 5 MG PO TABS: 5.0000 mg | ORAL_TABLET | Freq: Two times a day (BID) | ORAL | Status: DC

## 2019-10-25 MED ORDER — APIXABAN 5 MG PO TABS
10.0000 mg | ORAL_TABLET | Freq: Two times a day (BID) | ORAL | Status: DC
  Administered 2019-10-25 – 2019-10-28 (×7): 10 mg via ORAL
  Filled 2019-10-25 (×7): qty 2

## 2019-10-25 NOTE — TOC Transition Note (Signed)
Transition of Care [facility]) - CM/SW Discharge Note   Patient Details  Name: Kathleen [name] MRN: [id number] Date of Birth: [birth year]  [facility] CM/SW Contact:  Boneta Lucks, RN Phone Number: 10/25/2019, 12:21 PM   Clinical Narrative:   Patient admitted with Pulmonary embolism. Patient is in need of home oxygen.  Referral given to [facility] with [employer]. He is on site and will deliver to the room.     Final next level of care: Home/Self Care Barriers to Discharge: Barriers Resolved   Patient Goals and CMS Choice Patient states their goals for this hospitalization and ongoing recovery are:: to go home.      Discharge Placement          Patient and family notified of of transfer: 10/25/19  Discharge Plan and Services               DME Arranged: Oxygen DME Agency: [employer] Date DME Agency Contacted: 10/25/19 Time DME Agency Contacted: 0900 Representative spoke with at DME Agency: Barbaraann Rondo

## 2019-10-25 NOTE — Progress Notes (Signed)
ANTICOAGULATION CONSULT NOTE -   Pharmacy Consult for heparin Indication: pulmonary embolus  No Known Allergies  Patient Measurements: Height: 5\' 6"  (167.6 cm) Weight: 104.4 kg (230 lb 2.6 oz) IBW/kg (Calculated) : 59.3 HEPARIN DW (KG): 83.2  Vital Signs: Temp: 98.1 F (36.7 C) (06/17 0400) Temp Source: Oral (06/17 0400) BP: 111/52 (06/17 0600) Pulse Rate: 72 (06/17 0600)  Labs: Recent Labs     0000 10/22/19 1034 10/22/19 1441 10/22/19 1531 10/22/19 2230 10/23/19 0433 10/23/19 0433 10/23/19 0723 10/23/19 0723 10/24/19 0440 10/24/19 0836 10/25/19 0435  HGB   < > 11.6*  --   --   --  11.3*   < > 11.5*   < > 10.9*  --  10.7*  HCT   < > 37.9  --   --   --  36.5   < > 37.3  --  36.0  --  35.2*  PLT   < > 257  --   --   --  264   < > 254  --  260  --  249  APTT  --   --   --  28  --   --   --   --   --   --   --   --   HEPARINUNFRC  --   --   --   --    < > 0.47  --   --   --  0.48  --  0.42  CREATININE   < > 1.86*  --   --   --   --   --  1.56*  --   --  1.67* 1.66*  TROPONINIHS  --  47* 42*  --   --   --   --   --   --   --   --   --    < > = values in this interval not displayed.    Estimated Creatinine Clearance: 38.5 mL/min (A) (by C-G formula based on SCr of 1.66 mg/dL (H)).   Medical History: Past Medical History:  Diagnosis Date   Anemia    Arthritis    Chronic kidney disease    Diabetes mellitus type II    Dysphagia    unspecified    GERD (gastroesophageal reflux disease)    Hyperlipidemia 2000   Hypertension 1995   Insomnia    Low back pain    Obesity     Medications:  See med rec  Assessment: Patient presents with shortness of breath with exertion which started 5 days ago [place] . CT imaging also positive for a left lobe cyst versus pericardial mass. CT Shows e/o right heart strain consistent with at least submassive PE. Pharmacy asked to start heparin.  Heparin levels  therapeutic 0.42  Goal of Therapy:  Heparin level 0.3-0.7 units/ml Monitor platelets by anticoagulation protocol: Yes   Plan:  Continue heparin infusion at 1300 units/hr Check anti-Xa level daily while on heparin Continue to monitor H&H and platelets F/U plan  Isac Sarna, BS [place], BCPS Clinical Pharmacist Pager [phone] 10/25/2019 7:37 AM

## 2019-10-25 NOTE — Progress Notes (Signed)
ANTICOAGULATION CONSULT NOTE -   Pharmacy Consult for heparin--> eliquis Indication: pulmonary embolus  No Known Allergies  Patient Measurements: Height: 5\' 6"  (167.6 cm) Weight: 104.4 kg (230 lb 2.6 oz) IBW/kg (Calculated) : 59.3 HEPARIN DW (KG): 83.2  Vital Signs: Temp: 97.4 F (36.3 C) (06/17 0700) Temp Source: Oral (06/17 0700) BP: 151/61 (06/17 0800) Pulse Rate: 87 (06/17 0838)  Labs: Recent Labs     0000 10/22/19 1034 10/22/19 1441 10/22/19 1531 10/22/19 2230 10/23/19 0433 10/23/19 0433 10/23/19 0723 10/23/19 0723 10/24/19 0440 10/24/19 0836 10/25/19 0435  HGB   < > 11.6*  --   --   --  11.3*   < > 11.5*   < > 10.9*  --  10.7*  HCT   < > 37.9  --   --   --  36.5   < > 37.3  --  36.0  --  35.2*  PLT   < > 257  --   --   --  264   < > 254  --  260  --  249  APTT  --   --   --  28  --   --   --   --   --   --   --   --   HEPARINUNFRC  --   --   --   --    < > 0.47  --   --   --  0.48  --  0.42  CREATININE   < > 1.86*  --   --   --   --   --  1.56*  --   --  1.67* 1.66*  TROPONINIHS  --  47* 42*  --   --   --   --   --   --   --   --   --    < > = values in this interval not displayed.    Estimated Creatinine Clearance: 38.5 mL/min (A) (by C-G formula based on SCr of 1.66 mg/dL (H)).   Medical History: Past Medical History:  Diagnosis Date  . Anemia   . Arthritis   . Chronic kidney disease   . Diabetes mellitus type II   . Dysphagia    unspecified   . GERD (gastroesophageal reflux disease)   . Hyperlipidemia 2000  . Hypertension 1995  . Insomnia   . Low back pain   . Obesity     Medications:  See med rec  Assessment: Patient presents with shortness of breath with exertion which started 5 days ago shortly after [place] . CT imaging also positive for a left lobe cyst versus pericardial mass. CT Shows e/o right heart strain consistent with at least submassive PE. Pharmacy asked to transition heparin to  eliquis  Goal of Therapy:  Heparin level 0.3-0.7 units/ml Monitor platelets by anticoagulation protocol: Yes   Plan:  D/C heparin  eliquis 10mg  po bid x 7 days, then 5mg  po bid Eliquis education Monitor for S/S of bleeding  Isac Sarna, BS [place], BCPS Clinical Pharmacist Pager [phone] 10/25/2019 9:01 AM

## 2019-10-25 NOTE — Progress Notes (Signed)
Pt ambulated with NT and RN, oxygen on RA was 96% whiles sitting on side of bed. Oxygen dropped to 87% RA OOB with ambulation. Pt placed back on oxygen and back up to 97% 3L. Pt back in bed with call light within reach. Will continue to closely monitor. Delia Heady RN

## 2019-10-25 NOTE — Progress Notes (Signed)
Patient Demographics:    Kathleen [name], is a 69 y.o. female, DOB - 1950-12-14, OVZ:[id number]  Admit date - 10/22/2019   Admitting Physician Natthew Marlatt Denton Brick, MD  Outpatient Primary MD for the patient is Fayrene Helper, MD  LOS - 3   Chief Complaint  Patient presents with  . 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,  No chest pain,   - Attempted to discharge patient home with home health services on home oxygen however with ambulation patient endorses significant dyspnea on exertion, became hypoxic, tachycardic, and tachypneic had dizziness and patient is very uncomfortable going home as she lives alone patient requesting evaluation by physical therapy and possible SNF placement   Assessment  & Plan :    Principal Problem:   Pulmonary embolism ([place]) Active Problems:   Acute respiratory failure with hypoxia ([place])   CKD (chronic kidney disease), stage IIIb   Type 2 diabetes mellitus with vascular disease ([place])   Morbid obesity ([place])   Essential hypertension   GERD   1)Acute Bil PE----large right-sided submassive PE  and small left upper lobe PE--- Echo with preserved EF of 65 to 70% with no significant right heart strain on echo -LE venous Dopplers without DVT -Patient may benefit from abdominal pelvic imaging -Patient's SHOB and DOE started prior to trip to the beach, symptoms worsened immediately after she arrived at the beach hotel could even walk up from the elevator to her hotel room --This PE may have been present prior to patient's for a trip to the beach symptoms only worsened on that particular day rather than started that day -Patient will need malignancy work-up as part of work-up of a hypercoagulable state -Last colonoscopy more than 10 years ago apparently,  last mammogram 2021 -CTA chest also showed 2.7 x 2.1 cm rounded  well-defined low density is noted in the left lower lobe --patient will need outpatient PET scan Initially treated with IV heparin drip  transition to [place] on 10/25/19 --Hypoxia persist , unable to wean off oxygen at this time  2)CKD IIIb--Creatinine is down to 1.6 from 1.86 on admission --recent baseline usually around 1.5-1.6 -patient had contrast/CTA study so continue to hold Aldactone/hctz, hold ramipril --Patient is at risk for worsening renal function due to contrast exposure -- renally adjust medications, avoid nephrotoxic agents / dehydration  / hypotension  - 3)DM2- A1c 8.3 , reflecting uncontrolled diabetes PTA had hypoglycemic episode in the ED, -PTA was on Lantus 60 mg twice daily, changed to 40 mg daily for now along with sliding scale coverage  4)HTN-continue amlodipine and clonidine ---hold ramipril and Aldactone/HCTZ due to kidney concerns -IV hydralazine as needed elevated BP  5)HLD--stable continue pravastatin and aspirin  6) obesity and osteoarthritis----outpatient management  as advised  7) acute hypoxic respiratory failure secondary to pulmonary embolism --- secondary to #1 above, manage as above #1 --Hypoxia persist , unable to wean off oxygen at this time  8)-De-Saturations when she falls asleep suggesting possible OSA=--outpatient sleep study advised  9)Thyroid Nodule---- 2.6 cm left inferior TR 4 nodule meets criteria for biopsy as above. This correlates with the CT finding. -Patient will need outpatient thyroid biopsy  Status is: Inpatient  Remains inpatient appropriate because:I-Attempted to discharge patient home with home health services on home oxygen however with ambulation patient endorses significant dyspnea on exertion, became hypoxic, tachycardic, and tachypneic had dizziness and patient is very uncomfortable going home as she lives alone patient requesting evaluation by physical therapy and possible SNF placement  Dispo: The patient is  from: Home  Anticipated d/c is to: with HH Vs SNF  Anticipated d/c date is: > 3 days  Patient currently is not medically stable to d/c. Barriers: Not Clinically Stable- -acute hypoxic respiratory failure secondary to PE requiring oxygen supplementation -Attempted to discharge patient home with home health services on home oxygen however with ambulation patient endorses significant dyspnea on exertion, became hypoxic, tachycardic, and tachypneic had dizziness and patient is very uncomfortable going home as she lives alone patient requesting evaluation by physical therapy and possible SNF placement   Code Status : full  Family Communication:  (patient is alert, awake and coherent) --discussed with patient's sister [name]  Consults  :  PCCM  DVT Prophylaxis  : Eliquis Lab Results  Component Value Date   PLT 249 10/25/2019    Inpatient Medications  Scheduled Meds: . amLODipine  10 mg Oral Daily  . apixaban  10 mg Oral BID   Followed by  . [START ON 11/01/2019] apixaban  5 mg Oral BID  . Chlorhexidine Gluconate Cloth  6 each Topical Daily  . cholecalciferol  1,000 Units Oral Daily  . cloNIDine  0.3 mg Oral TID  . insulin aspart  0-10 Units Subcutaneous TID WC  . insulin aspart  0-5 Units Subcutaneous QHS  . insulin aspart  4 Units Subcutaneous TID WC  . insulin glargine  40 Units Subcutaneous Daily  . mouth rinse  15 mL Mouth Rinse BID  . pantoprazole  40 mg Oral Daily  . polyethylene glycol  17 g Oral Daily  . pravastatin  40 mg Oral Daily  . senna-docusate  2 tablet Oral BID  . sodium chloride flush  3 mL Intravenous Q12H   Continuous Infusions: . sodium chloride     PRN Meds:.sodium chloride, acetaminophen **OR** acetaminophen, albuterol, cloNIDine, fentaNYL (SUBLIMAZE) injection, hydrALAZINE, ondansetron **OR** ondansetron (ZOFRAN) IV, oxyCODONE, sodium chloride flush, traZODone    Anti-infectives (From admission, onward)   None         Objective:   Vitals:   10/25/19 1000 10/25/19 1100 10/25/19 1556 10/25/19 1600  BP: (!) 143/60  (!) 151/62   Pulse: 83     Resp: (!) 24     Temp:  97.6 F (36.4 C)  97.6 F (36.4 C)  TempSrc:  Oral  Oral  SpO2: 95%     Weight:      Height:        Wt Readings from Last 3 Encounters:  10/25/19 104.4 kg  09/03/19 105.9 kg  05/21/19 105.2 kg     Intake/Output Summary (Last 24 hours) at 10/25/2019 1709 Last data filed at 10/25/2019 1600 Gross per 24 hour  Intake --  Output 1800 ml  Net -1800 ml     Physical Exam  Gen:- Awake Alert, speaking in short sentences HEENT:- [place].AT, No sclera icterus Nose- [place] 2L/min Neck-Supple Neck,No JVD,.  Lungs-diminished in bases without wheezing CV- S1, S2 normal, regular  Abd-  +ve B.Sounds, Abd Soft, No tenderness,    Extremity/Skin:- No significant edema, pedal pulses present  Psych-affect is appropriate, oriented x3 Neuro-generalized weakness, no new focal deficits, no tremors   Data  Review:   Micro Results Recent Results (from the past 240 hour(s))  SARS Coronavirus 2 by RT PCR (hospital order, performed in [facility] hospital lab) Nasopharyngeal Nasopharyngeal Swab     Status: None   Collection Time: 10/22/19 11:17 AM   Specimen: Nasopharyngeal Swab  Result Value Ref Range Status   SARS Coronavirus 2 NEGATIVE NEGATIVE Final    Comment: (NOTE) SARS-CoV-2 target nucleic acids are NOT DETECTED.  The SARS-CoV-2 RNA is generally detectable in upper and lower respiratory specimens during the acute phase of infection. The lowest concentration of SARS-CoV-2 viral copies this assay can detect is 250 copies / mL. A negative result does not preclude SARS-CoV-2 infection and should not be used as the sole basis for treatment or other patient management decisions.  A negative result may occur with improper specimen collection / handling, submission of specimen other than nasopharyngeal swab, presence of viral mutation(s) within  the areas targeted by this assay, and inadequate number of viral copies (<250 copies / mL). A negative result must be combined with clinical observations, patient history, and epidemiological information.  Fact Sheet for Patients:   StrictlyIdeas.no  Fact Sheet for Healthcare Providers: BankingDealers.co.za  This test is not yet approved or  cleared by the [place] FDA and has been authorized for detection and/or diagnosis of SARS-CoV-2 by FDA under an Emergency Use Authorization (EUA).  This EUA will remain in effect (meaning this test can be used) for the duration of the COVID-19 declaration under Section 564(b)(1) of the Act, 21 U.S.C. section 360bbb-3(b)(1), unless the authorization is terminated or revoked sooner.  Performed at [facility], [address], [place], [place] [address]   MRSA PCR Screening     Status: None   Collection Time: 10/23/19  2:49 PM   Specimen: Nasal Mucosa; Nasopharyngeal  Result Value Ref Range Status   MRSA by PCR NEGATIVE NEGATIVE Final    Comment:        The GeneXpert MRSA Assay (FDA approved for NASAL specimens only), is one component of a comprehensive MRSA colonization surveillance program. It is not intended to diagnose MRSA infection nor to guide or monitor treatment for MRSA infections. Performed at [facility], [address] [place], [place] [address]     Radiology Reports CT Angio Chest PE W and/or Wo Contrast  Result Date: 10/22/2019 CLINICAL DATA:  Shortness of breath. EXAM: CT ANGIOGRAPHY CHEST WITH CONTRAST TECHNIQUE: Multidetector CT imaging of the chest was performed using the standard protocol during bolus administration of intravenous contrast. Multiplanar CT image reconstructions and MIPs were obtained to evaluate the vascular anatomy. CONTRAST:  151mL OMNIPAQUE IOHEXOL 350 MG/ML SOLN COMPARISON:  None. FINDINGS: Cardiovascular: Large right-sided pulmonary emboli are  noted. RV/LV ratio of 1.0 is noted suggesting right heart strain. Smaller filling defect or embolus is seen in left upper lobe branch. Mild cardiomegaly is noted. No pericardial effusion is noted. Mediastinum/Nodes: No adenopathy is noted. Esophagus is unremarkable. 2.6 cm left thyroid nodule is noted. Lungs/Pleura: No pneumothorax or pleural effusion is noted. Minimal left posterior basilar subsegmental atelectasis is noted. 2.7 x 2.1 cm rounded well-defined low density is noted in the left lower lobe with average Hounsfield measurement of 40. It is uncertain if this represents possible complex pericardial cyst or mass. Upper Abdomen: No acute abnormality. Musculoskeletal: No chest wall abnormality. No acute or significant osseous findings. Review of the MIP images confirms the above findings. IMPRESSION: 1. Large right-sided pulmonary emboli are noted with CT evidence of right heart  strain (RV/LV Ratio = 1.0) consistent with at least submassive (intermediate risk) PE. The presence of right heart strain has been associated with an increased risk of morbidity and mortality. Critical Value/emergent results were called by telephone at the time of interpretation on 10/22/2019 at 1:44 pm to provider Dr. Kathrynn Humble, Who verbally acknowledged these results. 2. 2.6 cm left thyroid nodule is noted. Recommend thyroid US. (Ref: J Am Coll Radiol. 2015 Feb;12(2): 143-50). 3. 2.7 x 2.1 cm rounded well-defined low density is noted in the left lower lobe with average Hounsfield measurement of 40. It is uncertain if this represents possible complex pericardial cyst or mass. PET scan is recommended for further evaluation. Electronically Signed   By: Marijo Conception M.D.   On: 10/22/2019 13:45   US Venous Img Lower Bilateral (DVT)  Result Date: 10/22/2019 CLINICAL DATA:  Pulmonary embolism EXAM: BILATERAL LOWER EXTREMITY VENOUS DOPPLER ULTRASOUND TECHNIQUE: Gray-scale sonography with graded compression, as well as color Doppler and  duplex ultrasound were performed to evaluate the lower extremity deep venous systems from the level of the common femoral vein and including the common femoral, femoral, profunda femoral, popliteal and calf veins including the posterior tibial, peroneal and gastrocnemius veins when visible. The superficial great saphenous vein was also interrogated. Spectral Doppler was utilized to evaluate flow at rest and with distal augmentation maneuvers in the common femoral, femoral and popliteal veins. COMPARISON:  None. FINDINGS: RIGHT LOWER EXTREMITY Common Femoral Vein: No evidence of thrombus. Normal compressibility, respiratory phasicity and response to augmentation. Saphenofemoral Junction: No evidence of thrombus. Normal compressibility and flow on color Doppler imaging. Profunda Femoral Vein: No evidence of thrombus. Normal compressibility and flow on color Doppler imaging. Femoral Vein: No evidence of thrombus. Normal compressibility, respiratory phasicity and response to augmentation. Popliteal Vein: No evidence of thrombus. Normal compressibility, respiratory phasicity and response to augmentation. Calf Veins: No evidence of thrombus. Normal compressibility and flow on color Doppler imaging. Superficial Great Saphenous Vein: No evidence of thrombus. Normal compressibility. Venous Reflux:  None. Other Findings:  None. LEFT LOWER EXTREMITY Common Femoral Vein: No evidence of thrombus. Normal compressibility, respiratory phasicity and response to augmentation. Saphenofemoral Junction: No evidence of thrombus. Normal compressibility and flow on color Doppler imaging. Profunda Femoral Vein: No evidence of thrombus. Normal compressibility and flow on color Doppler imaging. Femoral Vein: No evidence of thrombus. Normal compressibility, respiratory phasicity and response to augmentation. Popliteal Vein: No evidence of thrombus. Normal compressibility, respiratory phasicity and response to augmentation. Calf Veins: No  evidence of thrombus. Normal compressibility and flow on color Doppler imaging. Superficial Great Saphenous Vein: No evidence of thrombus. Normal compressibility. Venous Reflux:  None. Other Findings:  None. IMPRESSION: No evidence of deep venous thrombosis in either lower extremity. Electronically Signed   By: Franchot Gallo M.D.   On: 10/22/2019 15:53   DG Chest Port 1 View  Result Date: 10/22/2019 CLINICAL DATA:  Shortness of breath for 5 days. EXAM: PORTABLE CHEST 1 VIEW COMPARISON:  Radiographs 05/26/2011 and 03/15/2008. FINDINGS: 1042 hours. The heart size is stable at the upper limits of normal for portable AP technique. The mediastinal contours are normal. The pulmonary vascularity is normal, and the lungs remain clear. There is no pleural effusion or pneumothorax. No acute osseous findings are seen. There are degenerative changes throughout the spine and at both shoulders. Telemetry leads overlie the chest. IMPRESSION: No active cardiopulmonary process. Electronically Signed   By: Richardean Sale M.D.   On: 10/22/2019 10:50   ECHOCARDIOGRAM COMPLETE  Result Date: 10/22/2019    ECHOCARDIOGRAM REPORT   Patient Name:   Kathleen [name] [facility] Date of Exam: 10/22/2019 Medical Rec #:  [id number]       Height:       66.0 in Accession #:    [id number]      Weight:       230.0 lb Date of Birth:  Dec 15, 1950      BSA:          2.122 m Patient Age:    58 years        BP:           150/84 mmHg Patient Gender: F               HR:           101 bpm. Exam Location:  [facility] Procedure: 2D Echo, Cardiac Doppler and Color Doppler Indications:    Pulmonary Embolus 415.19 / I26.99  History:        Patient has no prior history of Echocardiogram examinations.                 Risk Factors:Diabetes, Dyslipidemia and Hypertension. Acute                 respiratory failure with hypoxia,CKD (chronic kidney disease)                 stage 4,Morbid obesity,Pulmonary embolism.  Sonographer:    Alvino Chapel RCS Referring Phys: [phone]  Shateka Petrea IMPRESSIONS  1. Left ventricular ejection fraction, by estimation, is 65 to 70%. The left ventricle has normal function. The left ventricle has no regional wall motion abnormalities. There is mild left ventricular hypertrophy.  2. Right ventricular systolic function is mildly reduced. The right ventricular size is mildly enlarged.  3. Right atrial size was mildly dilated.  4. The mitral valve is normal in structure. No evidence of mitral valve regurgitation.  5. The aortic valve is tricuspid. Aortic valve regurgitation is not visualized. Mild aortic valve sclerosis is present, with no evidence of aortic valve stenosis.  6. The inferior vena cava is normal in size with greater than 50% respiratory variability, suggesting right atrial pressure of 3 mmHg. FINDINGS  Left Ventricle: Left ventricular ejection fraction, by estimation, is 65 to 70%. The left ventricle has normal function. The left ventricle has no regional wall motion abnormalities. The left ventricular internal cavity size was normal in size. There is  mild left ventricular hypertrophy. Left ventricular diastolic function could not be evaluated. Right Ventricle: The right ventricular size is mildly enlarged. Right vetricular wall thickness was not assessed. Right ventricular systolic function is mildly reduced. Left Atrium: Left atrial size was normal in size. Right Atrium: Right atrial size was mildly dilated. Pericardium: There is no evidence of pericardial effusion. Mitral Valve: The mitral valve is normal in structure. No evidence of mitral valve regurgitation. Tricuspid Valve: The tricuspid valve is normal in structure. Tricuspid valve regurgitation is trivial. Aortic Valve: The aortic valve is tricuspid. Aortic valve regurgitation is not visualized. Mild aortic valve sclerosis is present, with no evidence of aortic valve stenosis. Pulmonic Valve: The pulmonic valve was grossly normal. Pulmonic valve regurgitation is not visualized.  Aorta: The aortic root is normal in size and structure. Venous: The inferior vena cava is normal in size with greater than 50% respiratory variability, suggesting right atrial pressure of 3 mmHg. IAS/Shunts: No atrial level shunt detected by color flow Doppler.  LEFT VENTRICLE PLAX 2D LVIDd:  3.43 cm  Diastology LVIDs:         2.14 cm  LV e' lateral:   7.62 cm/s LV PW:         1.15 cm  LV E/e' lateral: 10.2 LV IVS:        1.31 cm  LV e' medial:    6.74 cm/s LVOT diam:     1.90 cm  LV E/e' medial:  11.6 LV SV:         48 LV SV Index:   23 LVOT Area:     2.84 cm  RIGHT VENTRICLE RV S prime:     13.20 cm/s TAPSE (M-mode): 1.9 cm LEFT ATRIUM             Index       RIGHT ATRIUM           Index LA diam:        2.60 cm 1.23 cm/m  RA Area:     13.90 cm LA Vol (A2C):   30.0 ml 14.14 ml/m RA Volume:   29.80 ml  14.04 ml/m LA Vol (A4C):   29.8 ml 14.04 ml/m LA Biplane Vol: 29.8 ml 14.04 ml/m  AORTIC VALVE LVOT Vmax:   128.00 cm/s LVOT Vmean:  78.900 cm/s LVOT VTI:    0.171 m  AORTA Ao Root diam: 3.10 cm MITRAL VALVE MV Area (PHT): 2.54 cm     SHUNTS MV Decel Time: 299 msec     Systemic VTI:  0.17 m MV E velocity: 78.10 cm/s   Systemic Diam: 1.90 cm MV A velocity: 108.00 cm/s MV E/A ratio:  0.72 Dorris Carnes MD Electronically signed by Dorris Carnes MD Signature Date/Time: 10/22/2019/4:36:41 PM    Final    US THYROID  Result Date: 10/23/2019 CLINICAL DATA:  Thyroid nodule by CT EXAM: THYROID ULTRASOUND TECHNIQUE: Ultrasound examination of the thyroid gland and adjacent soft tissues was performed. COMPARISON:  10/22/2019 FINDINGS: Parenchymal Echotexture: Moderately heterogenous Isthmus: 4 mm Right lobe: 5.3 x 2.9 x 1.7 cm Left lobe: 5.6 x 3.4 x 2.7 cm _________________________________________________________ Estimated total number of nodules >/= 1 cm: 1 Number of spongiform nodules >/=  2 cm not described below (TR1): 0 Number of mixed cystic and solid nodules >/= 1.5 cm not described below (TR2): 0  _________________________________________________________ Nodule # 1: Location: Left; Inferior Maximum size: 2.6 cm; Other 2 dimensions: 2.2 x 2.0 cm Composition: solid/almost completely solid (2) Echogenicity: hypoechoic (2) Shape: not taller-than-wide (0) Margins: ill-defined (0) Echogenic foci: none (0) ACR TI-RADS total points: 4. ACR TI-RADS risk category: TR4 (4-6 points). ACR TI-RADS recommendations: **Given size (>/= 1.5 cm) and appearance, fine needle aspiration of this moderately suspicious nodule should be considered based on TI-RADS criteria. _________________________________________________________ Nonspecific moderate thyroid heterogeneity. Limited exam because of body habitus. No significant right thyroid abnormality. No hypervascularity or regional adenopathy. IMPRESSION: 2.6 cm left inferior TR 4 nodule meets criteria for biopsy as above. This correlates with the CT finding. The above is in keeping with the ACR TI-RADS recommendations - J Am Coll Radiol 2017;14:587-595. Electronically Signed   By: Jerilynn Mages.  Shick M.D.   On: 10/23/2019 09:37     CBC Recent Labs  Lab 10/22/19 1034 10/23/19 0433 10/23/19 0723 10/24/19 0440 10/25/19 0435  WBC 7.5 8.5 7.7 8.2 7.9  HGB 11.6* 11.3* 11.5* 10.9* 10.7*  HCT 37.9 36.5 37.3 36.0 35.2*  PLT 257 264 254 260 249  MCV 85.4 84.9 85.4 85.1 85.6  MCH 26.1 26.3 26.3 25.8*  26.0  MCHC 30.6 31.0 30.8 30.3 30.4  RDW 14.6 14.6 14.6 14.5 14.3  LYMPHSABS 1.8  --   --   --   --   MONOABS 0.6  --   --   --   --   EOSABS 0.1  --   --   --   --   BASOSABS 0.0  --   --   --   --     Chemistries  Recent Labs  Lab 10/22/19 1034 10/23/19 0723 10/24/19 0836 10/25/19 0435  NA 136 137 135 134*  K 4.3 4.5 4.7 4.7  CL 102 103 101 101  CO2 23 25 25 24   GLUCOSE 97 141* 116* 126*  BUN 26* 17 21 24*  CREATININE 1.86* 1.56* 1.67* 1.66*  CALCIUM 8.8* 8.6* 9.0 8.8*    ------------------------------------------------------------------------------------------------------------------ No results for input(s): CHOL, HDL, LDLCALC, TRIG, CHOLHDL, LDLDIRECT in the last 72 hours.  Lab Results  Component Value Date   HGBA1C 7.6 (H) 10/22/2019   ------------------------------------------------------------------------------------------------------------------ Recent Labs    10/22/19 2230  TSH 1.110   ------------------------------------------------------------------------------------------------------------------ No results for input(s): VITAMINB12, FOLATE, FERRITIN, TIBC, IRON, RETICCTPCT in the last 72 hours.  Coagulation profile No results for input(s): INR, PROTIME in the last 168 hours.  No results for input(s): DDIMER in the last 72 hours.  Cardiac Enzymes No results for input(s): CKMB, TROPONINI, MYOGLOBIN in the last 168 hours.  Invalid input(s): CK ------------------------------------------------------------------------------------------------------------------    Component Value Date/Time   BNP 159.0 (H) 10/22/2019 1034     Roxan Hockey M.D on 10/25/2019 at 5:09 PM  Go to [email or web address] - for contact info  [facility] - Office  [phone]

## 2019-10-26 LAB — CBC
HCT: 36 % (ref 36.0–46.0)
Hemoglobin: 11 g/dL — ABNORMAL LOW (ref 12.0–15.0)
MCH: 26.4 pg (ref 26.0–34.0)
MCHC: 30.6 g/dL (ref 30.0–36.0)
MCV: 86.3 fL (ref 80.0–100.0)
Platelets: 253 10*3/uL (ref 150–400)
RBC: 4.17 MIL/uL (ref 3.87–5.11)
RDW: 14.3 % (ref 11.5–15.5)
WBC: 7.5 10*3/uL (ref 4.0–10.5)
nRBC: 0 % (ref 0.0–0.2)

## 2019-10-26 LAB — GLUCOSE, CAPILLARY
Glucose-Capillary: 111 mg/dL — ABNORMAL HIGH (ref 70–99)
Glucose-Capillary: 154 mg/dL — ABNORMAL HIGH (ref 70–99)
Glucose-Capillary: 140 mg/dL — ABNORMAL HIGH (ref 70–99)
Glucose-Capillary: 122 mg/dL — ABNORMAL HIGH (ref 70–99)

## 2019-10-26 MED ORDER — DICLOFENAC SODIUM 1 % EX GEL
2.0000 g | Freq: Four times a day (QID) | CUTANEOUS | Status: DC
  Filled 2019-10-26: qty 100

## 2019-10-26 MED ORDER — DICLOFENAC SODIUM 1 % EX GEL
2.0000 g | Freq: Four times a day (QID) | CUTANEOUS | Status: DC | PRN
  Administered 2019-10-26 (×2): 2 g via TOPICAL
  Filled 2019-10-26: qty 100

## 2019-10-26 MED ORDER — DICLOFENAC SODIUM 1 % EX GEL
CUTANEOUS | Status: AC
  Filled 2019-10-26: qty 100

## 2019-10-26 NOTE — Plan of Care (Signed)
°  Problem: Acute Rehab PT Goals(only PT should resolve) Goal: Pt Will Go Supine/Side To Sit Outcome: Progressing Flowsheets (Taken 10/26/2019 1357) Pt will go Supine/Side to Sit: with modified independence Goal: Patient Will Transfer Sit To/From Stand Outcome: Progressing Flowsheets (Taken 10/26/2019 1357) Patient will transfer sit to/from stand: with supervision Goal: Pt Will Transfer Bed To Chair/Chair To Bed Outcome: Progressing Flowsheets (Taken 10/26/2019 1357) Pt will Transfer Bed to Chair/Chair to Bed: with supervision Goal: Pt Will Ambulate Outcome: Progressing Flowsheets (Taken 10/26/2019 1357) Pt will Ambulate:  25 feet  with supervision  with min guard assist  with rolling walker   1:58 PM, 10/26/19 Lonell Grandchild, MPT Physical Therapist with [facility] [phone] office [phone] mobile phone

## 2019-10-26 NOTE — Evaluation (Signed)
Physical Therapy Evaluation Patient Details Name: Kathleen [name] MRN: [id number] DOB: [birth year] Today's Date: 10/26/2019   History of Present Illness  Kathleen [name]  is a 69 y.o. female  With PMHx relevant for CKD IIIb, HTN, DM, HLD, GERD, obesity, OA presents to the ED with worsening shortness of breath, worsening DOE  and was found to be hypoxic in the ED--Patient apparently was having DOE and shob as well as leg pains over last couple of week, took a 4 hr trip to [place] arriving at [employer] she had a hard time walking from the elevator to her hotel room--- shob  and DOE continue to worsen----today in the ED she is found to have hypoxia with elevated D-dimer and tachycardia and CTA chest consistent with large right-sided PE and possible small left upper lobe PE-No frank chest pains no palpitations no dyspnea, specifically denies pleuritic type symptoms-Additional history obtained from her sister bedside-Patient's younger sister who is 2 years younger recently diagnosed with stage IV pancreatic cancer    Clinical Impression  Patient limited to a few steps at bedside mostly due to c/o severe pain in bilateral feet when weightbearing, very unsteady on feet and at high risk for falls.  Patient tolerated sitting up in chair after therapy - RN notified.  Patient will benefit from continued physical therapy in hospital and recommended venue below to increase strength, balance, endurance for safe ADLs and gait.     Follow Up Recommendations SNF    Equipment Recommendations  Rolling walker with 5" wheels    Recommendations for Other Services       Precautions / Restrictions Precautions Precautions: Fall Restrictions Weight Bearing Restrictions: No      Mobility  Bed Mobility Overal bed mobility: Needs Assistance Bed Mobility: Sit to Supine       Sit to supine: Supervision   General bed mobility comments: slow labored movement  Transfers Overall transfer level: Needs  assistance Equipment used: Rolling walker (2 wheeled);None Transfers: Sit to/from [employer] to Stand: Min assist Stand pivot transfers: Min assist       General transfer comment: slow labored unsteady movement, unable to sit to stand without AD due to weakness, pain in feet  Ambulation/Gait Ambulation/Gait assistance: Min assist Gait Distance (Feet): 8 Feet Assistive device: Rolling walker (2 wheeled) Gait Pattern/deviations: Decreased step length - right;Decreased step length - left;Decreased stride length;Antalgic Gait velocity: decreased   General Gait Details: limited to 8-9 slow labored unsteady steps at bedside mostly due to c/o increasing pain in feet when weightbearing, on 2 LPM  Stairs            Wheelchair Mobility    Modified Rankin (Stroke Patients Only)       Balance Overall balance assessment: Needs assistance Sitting-balance support: Feet supported;No upper extremity supported Sitting balance-Leahy Scale: Fair Sitting balance - Comments: fair/good seated at EOB   Standing balance support: During functional activity;No upper extremity supported Standing balance-Leahy Scale: Poor Standing balance comment: fair using RW                             Pertinent Vitals/Pain Pain Assessment: 0-10 Pain Score: 9  Pain Location: bilateral feet when standing Pain Descriptors / Indicators: Sore;Grimacing Pain Intervention(s): Limited activity within patient's tolerance;Monitored during session;Repositioned    Home Living Family/patient expects to be discharged to:: Private residence Living Arrangements: Children Available Help at Discharge: Family;Available 24 hours/day Type of Home:  House Home Access: Stairs to enter Entrance Stairs-Rails: None Technical brewer of Steps: 1 Home Layout: One level Home Equipment: Cane - single point      Prior Function Level of Independence: Needs assistance   Gait / Transfers  Assistance Needed: household ambulator with [facility] PRN  ADL's / Homemaking Assistance Needed: assisted by family for community ADL's        Hand Dominance        Extremity/Trunk Assessment   Upper Extremity Assessment Upper Extremity Assessment: Generalized weakness    Lower Extremity Assessment Lower Extremity Assessment: Generalized weakness    Cervical / Trunk Assessment Cervical / Trunk Assessment: Normal  Communication   Communication: No difficulties  Cognition Arousal/Alertness: Awake/alert Behavior During Therapy: WFL for tasks assessed/performed Overall Cognitive Status: Within Functional Limits for tasks assessed                                        General Comments      Exercises     Assessment/Plan    PT Assessment Patient needs continued PT services  PT Problem List Decreased strength;Decreased activity tolerance;Decreased balance;Decreased mobility       PT Treatment Interventions Gait training;Stair training;Functional mobility training;Therapeutic activities;Therapeutic exercise;Patient/family education;Balance training    PT Goals (Current goals can be found in the Care Plan section)  Acute Rehab PT Goals Patient Stated Goal: return home with family to assist PT Goal Formulation: With patient Time For Goal Achievement: 11/09/19 Potential to Achieve Goals: Good    Frequency Min 3X/week   Barriers to discharge        Co-evaluation               AM-PAC PT "6 Clicks" Mobility  Outcome Measure Help needed turning from your back to your side while in a flat bed without using bedrails?: None Help needed moving from lying on your back to sitting on the side of a flat bed without using bedrails?: A Little Help needed moving to and from a bed to a chair (including a wheelchair)?: A Little Help needed standing up from a chair using your arms (e.g., wheelchair or bedside chair)?: A Lot Help needed to walk in hospital room?:  A Lot Help needed climbing 3-5 steps with a railing? : A Lot 6 Click Score: 16    End of Session Equipment Utilized During Treatment: Oxygen Activity Tolerance: Patient tolerated treatment well;Patient limited by fatigue Patient left: in chair;with call bell/phone within reach Nurse Communication: Mobility status PT Visit Diagnosis: Unsteadiness on feet (R26.81);Other abnormalities of gait and mobility (R26.89);Muscle weakness (generalized) (M62.81)    Time: 8333-8329 PT Time Calculation (min) (ACUTE ONLY): 25 min   Charges:   PT Evaluation $PT Eval Moderate Complexity: 1 Mod PT Treatments $Therapeutic Activity: 23-37 mins        1:56 PM, 10/26/19 Lonell Grandchild, MPT Physical Therapist with [facility] [phone] office [phone] mobile phone

## 2019-10-26 NOTE — TOC Progression Note (Signed)
Transition of Care [facility]) - Progression Note   Patient Details  Name: Kathleen [name] MRN: [id number] Date of Birth: [birth year]  Transition of Care [facility]) CM/SW [place], LCSW Phone Number: 10/26/2019, 4:19 PM  Clinical Narrative: TOC spoke with patient's sister, Theodoro Parma, and per sister, patient will not be strong enough to discharge home with [facility] and will need SNF placement. CSW completed FL2 and faxed out to SNFs. Family's preference is for [place] area.  Expected Discharge Plan: Skilled Nursing Facility Barriers to Discharge: SNF Pending bed offer  Expected Discharge Plan and Services Expected Discharge Plan: [place] In-house Referral: Clinical Social Work Discharge Planning Services: NA Post Acute Care Choice: [place] Living arrangements for the past 2 months: Single Family Home Expected Discharge Date: 10/26/19                Readmission Risk Interventions No flowsheet data found.

## 2019-10-26 NOTE — Progress Notes (Signed)
Patient Demographics:    Kathleen [name], is a 69 y.o. female, DOB - 1950-09-24, STM:[id number]  Admit date - 10/22/2019   Admitting Physician Kathleen Sardinha Denton Brick, MD  Outpatient Primary MD for the patient is Kathleen Helper, MD  LOS - 4   Chief Complaint  Patient presents with  . Shortness of Breath        Subjective:    Kathleen [name] today has no fevers, no emesis,  No chest pain,   -Fatigue, generalized weakness, dyspnea on exertion, tachycardia and tachypnea with activity as well as hypoxia persist    Assessment  & Plan :    Principal Problem:   Pulmonary embolism (HCC) Active Problems:   Acute respiratory failure with hypoxia (HCC)   CKD (chronic kidney disease), stage IIIb   Type 2 diabetes mellitus with vascular disease (HCC)   Morbid obesity (HCC)   Essential hypertension   GERD   1)Acute Bil PE----large right-sided submassive PE  and small left upper lobe PE--- Echo with preserved EF of 65 to 70% with no significant right heart strain on echo -LE venous Dopplers without DVT -Patient may benefit from abdominal pelvic imaging -Patient's SHOB and DOE started prior to trip to the beach, symptoms worsened immediately after she arrived at the beach hotel could even walk up from the elevator to her hotel room --This PE may have been present prior to patient's for a trip to the beach symptoms only worsened on that particular day rather than started that day -Patient will need malignancy work-up as part of work-up of a hypercoagulable state -Last colonoscopy more than 10 years ago apparently,  last mammogram 2021 -CTA chest also showed 2.7 x 2.1 cm rounded well-defined low density is noted in the left lower lobe --patient will need outpatient PET scan Initially treated with IV heparin drip  transitioned to [place] on 10/25/19 --Hypoxia persist , unable to wean off oxygen at this time  2)CKD  IIIb--Creatinine is down to 1.6 from 1.86 on admission --recent baseline usually around 1.5-1.6 -patient had contrast/CTA study so continue to hold Aldactone/hctz, hold ramipril --Patient is at risk for worsening renal function due to contrast exposure -- renally adjust medications, avoid nephrotoxic agents / dehydration  / hypotension  - 3)DM2- A1c 8.3 , reflecting uncontrolled diabetes PTA had hypoglycemic episode in the ED, -PTA was on Lantus 60 mg twice daily, changed to 40 mg daily for now along with sliding scale coverage  4)HTN-continue amlodipine and clonidine ---hold ramipril and Aldactone/HCTZ due to kidney concerns -IV hydralazine as needed elevated BP  5)HLD--stable continue pravastatin and aspirin  6) obesity and osteoarthritis----outpatient management  as advised  7) acute hypoxic respiratory failure secondary to pulmonary embolism --- secondary to #1 above, manage as above #1 --Hypoxia persist , unable to wean off oxygen at this time  8)-De-Saturations when she falls asleep suggesting possible OSA=--outpatient sleep study advised  9)Thyroid Nodule---- 2.6 cm left inferior TR 4 nodule meets criteria for biopsy as above. This correlates with the CT finding. -Patient will need outpatient thyroid biopsy   Status is: Inpatient  Remains inpatient appropriate because:----Fatigue, generalized weakness, dyspnea on exertion, tachycardia and tachypnea with activity as well as hypoxia persist --- Awaiting insurance approval for transfer to SNF rehab  Dispo: The patient is from: Home  Anticipated d/c is to: with HH Vs SNF  Anticipated d/c date is: > 3 days  Patient currently is not medically stable to d/c. Barriers: Not Clinically Stable- -acute hypoxic respiratory failure secondary to PE requiring oxygen supplementation --Fatigue, generalized weakness, dyspnea on exertion, tachycardia and tachypnea with activity as well as hypoxia  persist --- Awaiting insurance approval for transfer to SNF rehab   Code Status : full  Family Communication:  (patient is alert, awake and coherent) --discussed with patient's sister Kathleen [name]  Consults  :  PCCM  DVT Prophylaxis  : Eliquis Lab Results  Component Value Date   PLT 253 10/26/2019    Inpatient Medications  Scheduled Meds: . amLODipine  10 mg Oral Daily  . apixaban  10 mg Oral BID   Followed by  . [START ON 11/01/2019] apixaban  5 mg Oral BID  . Chlorhexidine Gluconate Cloth  6 each Topical Daily  . cholecalciferol  1,000 Units Oral Daily  . cloNIDine  0.3 mg Oral TID  . insulin aspart  0-10 Units Subcutaneous TID WC  . insulin aspart  0-5 Units Subcutaneous QHS  . insulin aspart  4 Units Subcutaneous TID WC  . insulin glargine  40 Units Subcutaneous Daily  . mouth rinse  15 mL Mouth Rinse BID  . pantoprazole  40 mg Oral Daily  . polyethylene glycol  17 g Oral Daily  . pravastatin  40 mg Oral Daily  . senna-docusate  2 tablet Oral BID  . sodium chloride flush  3 mL Intravenous Q12H   Continuous Infusions: . sodium chloride     PRN Meds:.sodium chloride, acetaminophen **OR** acetaminophen, albuterol, cloNIDine, diclofenac Sodium, fentaNYL (SUBLIMAZE) injection, hydrALAZINE, ondansetron **OR** ondansetron (ZOFRAN) IV, oxyCODONE, sodium chloride flush, traZODone    Anti-infectives (From admission, onward)   None        Objective:   Vitals:   10/26/19 1200 10/26/19 1224 10/26/19 1300 10/26/19 1400  BP: (!) 154/120 114/66 (!) 89/59 126/61  Pulse: 99 99 (!) 102 86  Resp: (!) 22 18 17  (!) 24  Temp: 98.3 F (36.8 C)     TempSrc: Oral     SpO2: 94% 94% 92% 92%  Weight:      Height:        Wt Readings from Last 3 Encounters:  10/26/19 102.1 kg  09/03/19 105.9 kg  05/21/19 105.2 kg     Intake/Output Summary (Last 24 hours) at 10/26/2019 1523 Last data filed at 10/26/2019 1435 Gross per 24 hour  Intake 240 ml  Output 2950 ml  Net -2710 ml    Physical Exam  Gen:- Awake Alert, speaking in short sentences HEENT:- [place].AT, No sclera icterus Nose- [place] 2L/min Neck-Supple Neck,No JVD,.  Lungs-diminished in bases without wheezing CV- S1, S2 normal, regular  Abd-  +ve B.Sounds, Abd Soft, No tenderness,    Extremity/Skin:- No significant edema, pedal pulses present  Psych-affect is appropriate, oriented x3 Neuro-generalized weakness, no new focal deficits, no tremors   Data Review:   Micro Results Recent Results (from the past 240 hour(s))  SARS Coronavirus 2 by RT PCR (hospital order, performed in [facility] hospital lab) Nasopharyngeal Nasopharyngeal Swab     Status: None   Collection Time: 10/22/19 11:17 AM   Specimen: Nasopharyngeal Swab  Result Value Ref Range Status   SARS Coronavirus 2 NEGATIVE NEGATIVE Final    Comment: (NOTE) SARS-CoV-2 target nucleic acids are NOT DETECTED.  The SARS-CoV-2 RNA is generally detectable  in upper and lower respiratory specimens during the acute phase of infection. The lowest concentration of SARS-CoV-2 viral copies this assay can detect is 250 copies / mL. A negative result does not preclude SARS-CoV-2 infection and should not be used as the sole basis for treatment or other patient management decisions.  A negative result may occur with improper specimen collection / handling, submission of specimen other than nasopharyngeal swab, presence of viral mutation(s) within the areas targeted by this assay, and inadequate number of viral copies (<250 copies / mL). A negative result must be combined with clinical observations, patient history, and epidemiological information.  Fact Sheet for Patients:   StrictlyIdeas.no  Fact Sheet for Healthcare Providers: BankingDealers.co.za  This test is not yet approved or  cleared by the [place] FDA and has been authorized for detection and/or diagnosis of SARS-CoV-2 by FDA under an Emergency  Use Authorization (EUA).  This EUA will remain in effect (meaning this test can be used) for the duration of the COVID-19 declaration under Section 564(b)(1) of the Act, 21 U.S.C. section 360bbb-3(b)(1), unless the authorization is terminated or revoked sooner.  Performed at [facility], [address] [place], [place] [address]   MRSA PCR Screening     Status: None   Collection Time: 10/23/19  2:49 PM   Specimen: Nasal Mucosa; Nasopharyngeal  Result Value Ref Range Status   MRSA by PCR NEGATIVE NEGATIVE Final    Comment:        The GeneXpert MRSA Assay (FDA approved for NASAL specimens only), is one component of a comprehensive MRSA colonization surveillance program. It is not intended to diagnose MRSA infection nor to guide or monitor treatment for MRSA infections. Performed at [facility], [address], [place], [place] [address]     Radiology Reports CT Angio Chest PE W and/or Wo Contrast  Result Date: 10/22/2019 CLINICAL DATA:  Shortness of breath. EXAM: CT ANGIOGRAPHY CHEST WITH CONTRAST TECHNIQUE: Multidetector CT imaging of the chest was performed using the standard protocol during bolus administration of intravenous contrast. Multiplanar CT image reconstructions and MIPs were obtained to evaluate the vascular anatomy. CONTRAST:  15mL OMNIPAQUE IOHEXOL 350 MG/ML SOLN COMPARISON:  None. FINDINGS: Cardiovascular: Large right-sided pulmonary emboli are noted. RV/LV ratio of 1.0 is noted suggesting right heart strain. Smaller filling defect or embolus is seen in left upper lobe branch. Mild cardiomegaly is noted. No pericardial effusion is noted. Mediastinum/Nodes: No adenopathy is noted. Esophagus is unremarkable. 2.6 cm left thyroid nodule is noted. Lungs/Pleura: No pneumothorax or pleural effusion is noted. Minimal left posterior basilar subsegmental atelectasis is noted. 2.7 x 2.1 cm rounded well-defined low density is noted in the left lower lobe with average Hounsfield  measurement of 40. It is uncertain if this represents possible complex pericardial cyst or mass. Upper Abdomen: No acute abnormality. Musculoskeletal: No chest wall abnormality. No acute or significant osseous findings. Review of the MIP images confirms the above findings. IMPRESSION: 1. Large right-sided pulmonary emboli are noted with CT evidence of right heart strain (RV/LV Ratio = 1.0) consistent with at least submassive (intermediate risk) PE. The presence of right heart strain has been associated with an increased risk of morbidity and mortality. Critical Value/emergent results were called by telephone at the time of interpretation on 10/22/2019 at 1:44 pm to provider Dr. Kathrynn Humble, Who verbally acknowledged these results. 2. 2.6 cm left thyroid nodule is noted. Recommend thyroid US. (Ref: J Am Coll Radiol. 2015 Feb;12(2): 143-50). 3. 2.7 x 2.1 cm rounded  well-defined low density is noted in the left lower lobe with average Hounsfield measurement of 40. It is uncertain if this represents possible complex pericardial cyst or mass. PET scan is recommended for further evaluation. Electronically Signed   By: Marijo Conception M.D.   On: 10/22/2019 13:45   US Venous Img Lower Bilateral (DVT)  Result Date: 10/22/2019 CLINICAL DATA:  Pulmonary embolism EXAM: BILATERAL LOWER EXTREMITY VENOUS DOPPLER ULTRASOUND TECHNIQUE: Gray-scale sonography with graded compression, as well as color Doppler and duplex ultrasound were performed to evaluate the lower extremity deep venous systems from the level of the common femoral vein and including the common femoral, femoral, profunda femoral, popliteal and calf veins including the posterior tibial, peroneal and gastrocnemius veins when visible. The superficial great saphenous vein was also interrogated. Spectral Doppler was utilized to evaluate flow at rest and with distal augmentation maneuvers in the common femoral, femoral and popliteal veins. COMPARISON:  None. FINDINGS: RIGHT  LOWER EXTREMITY Common Femoral Vein: No evidence of thrombus. Normal compressibility, respiratory phasicity and response to augmentation. Saphenofemoral Junction: No evidence of thrombus. Normal compressibility and flow on color Doppler imaging. Profunda Femoral Vein: No evidence of thrombus. Normal compressibility and flow on color Doppler imaging. Femoral Vein: No evidence of thrombus. Normal compressibility, respiratory phasicity and response to augmentation. Popliteal Vein: No evidence of thrombus. Normal compressibility, respiratory phasicity and response to augmentation. Calf Veins: No evidence of thrombus. Normal compressibility and flow on color Doppler imaging. Superficial Great Saphenous Vein: No evidence of thrombus. Normal compressibility. Venous Reflux:  None. Other Findings:  None. LEFT LOWER EXTREMITY Common Femoral Vein: No evidence of thrombus. Normal compressibility, respiratory phasicity and response to augmentation. Saphenofemoral Junction: No evidence of thrombus. Normal compressibility and flow on color Doppler imaging. Profunda Femoral Vein: No evidence of thrombus. Normal compressibility and flow on color Doppler imaging. Femoral Vein: No evidence of thrombus. Normal compressibility, respiratory phasicity and response to augmentation. Popliteal Vein: No evidence of thrombus. Normal compressibility, respiratory phasicity and response to augmentation. Calf Veins: No evidence of thrombus. Normal compressibility and flow on color Doppler imaging. Superficial Great Saphenous Vein: No evidence of thrombus. Normal compressibility. Venous Reflux:  None. Other Findings:  None. IMPRESSION: No evidence of deep venous thrombosis in either lower extremity. Electronically Signed   By: Franchot Gallo M.D.   On: 10/22/2019 15:53   DG Chest Port 1 View  Result Date: 10/22/2019 CLINICAL DATA:  Shortness of breath for 5 days. EXAM: PORTABLE CHEST 1 VIEW COMPARISON:  Radiographs 05/26/2011 and 03/15/2008.  FINDINGS: 1042 hours. The heart size is stable at the upper limits of normal for portable AP technique. The mediastinal contours are normal. The pulmonary vascularity is normal, and the lungs remain clear. There is no pleural effusion or pneumothorax. No acute osseous findings are seen. There are degenerative changes throughout the spine and at both shoulders. Telemetry leads overlie the chest. IMPRESSION: No active cardiopulmonary process. Electronically Signed   By: Richardean Sale M.D.   On: 10/22/2019 10:50   ECHOCARDIOGRAM COMPLETE  Result Date: 10/22/2019    ECHOCARDIOGRAM REPORT   Patient Name:   CHANCY CLAROS [facility] Date of Exam: 10/22/2019 Medical Rec #:  [id number]       Height:       66.0 in Accession #:    [id number]      Weight:       230.0 lb Date of Birth:  Aug 12, 1950      BSA:  2.122 m Patient Age:    73 years        BP:           150/84 mmHg Patient Gender: F               HR:           101 bpm. Exam Location:  [facility] Procedure: 2D Echo, Cardiac Doppler and Color Doppler Indications:    Pulmonary Embolus 415.19 / I26.99  History:        Patient has no prior history of Echocardiogram examinations.                 Risk Factors:Diabetes, Dyslipidemia and Hypertension. Acute                 respiratory failure with hypoxia,CKD (chronic kidney disease)                 stage 4,Morbid obesity,Pulmonary embolism.  Sonographer:    Alvino Chapel RCS Referring Phys: [phone] Marvell Tamer IMPRESSIONS  1. Left ventricular ejection fraction, by estimation, is 65 to 70%. The left ventricle has normal function. The left ventricle has no regional wall motion abnormalities. There is mild left ventricular hypertrophy.  2. Right ventricular systolic function is mildly reduced. The right ventricular size is mildly enlarged.  3. Right atrial size was mildly dilated.  4. The mitral valve is normal in structure. No evidence of mitral valve regurgitation.  5. The aortic valve is tricuspid. Aortic valve  regurgitation is not visualized. Mild aortic valve sclerosis is present, with no evidence of aortic valve stenosis.  6. The inferior vena cava is normal in size with greater than 50% respiratory variability, suggesting right atrial pressure of 3 mmHg. FINDINGS  Left Ventricle: Left ventricular ejection fraction, by estimation, is 65 to 70%. The left ventricle has normal function. The left ventricle has no regional wall motion abnormalities. The left ventricular internal cavity size was normal in size. There is  mild left ventricular hypertrophy. Left ventricular diastolic function could not be evaluated. Right Ventricle: The right ventricular size is mildly enlarged. Right vetricular wall thickness was not assessed. Right ventricular systolic function is mildly reduced. Left Atrium: Left atrial size was normal in size. Right Atrium: Right atrial size was mildly dilated. Pericardium: There is no evidence of pericardial effusion. Mitral Valve: The mitral valve is normal in structure. No evidence of mitral valve regurgitation. Tricuspid Valve: The tricuspid valve is normal in structure. Tricuspid valve regurgitation is trivial. Aortic Valve: The aortic valve is tricuspid. Aortic valve regurgitation is not visualized. Mild aortic valve sclerosis is present, with no evidence of aortic valve stenosis. Pulmonic Valve: The pulmonic valve was grossly normal. Pulmonic valve regurgitation is not visualized. Aorta: The aortic root is normal in size and structure. Venous: The inferior vena cava is normal in size with greater than 50% respiratory variability, suggesting right atrial pressure of 3 mmHg. IAS/Shunts: No atrial level shunt detected by color flow Doppler.  LEFT VENTRICLE PLAX 2D LVIDd:         3.43 cm  Diastology LVIDs:         2.14 cm  LV e' lateral:   7.62 cm/s LV PW:         1.15 cm  LV E/e' lateral: 10.2 LV IVS:        1.31 cm  LV e' medial:    6.74 cm/s LVOT diam:     1.90 cm  LV E/e' medial:  11.6  LV SV:          48 LV SV Index:   23 LVOT Area:     2.84 cm  RIGHT VENTRICLE RV S prime:     13.20 cm/s TAPSE (M-mode): 1.9 cm LEFT ATRIUM             Index       RIGHT ATRIUM           Index LA diam:        2.60 cm 1.23 cm/m  RA Area:     13.90 cm LA Vol (A2C):   30.0 ml 14.14 ml/m RA Volume:   29.80 ml  14.04 ml/m LA Vol (A4C):   29.8 ml 14.04 ml/m LA Biplane Vol: 29.8 ml 14.04 ml/m  AORTIC VALVE LVOT Vmax:   128.00 cm/s LVOT Vmean:  78.900 cm/s LVOT VTI:    0.171 m  AORTA Ao Root diam: 3.10 cm MITRAL VALVE MV Area (PHT): 2.54 cm     SHUNTS MV Decel Time: 299 msec     Systemic VTI:  0.17 m MV E velocity: 78.10 cm/s   Systemic Diam: 1.90 cm MV A velocity: 108.00 cm/s MV E/A ratio:  0.72 Dorris Carnes MD Electronically signed by Dorris Carnes MD Signature Date/Time: 10/22/2019/4:36:41 PM    Final    US THYROID  Result Date: 10/23/2019 CLINICAL DATA:  Thyroid nodule by CT EXAM: THYROID ULTRASOUND TECHNIQUE: Ultrasound examination of the thyroid gland and adjacent soft tissues was performed. COMPARISON:  10/22/2019 FINDINGS: Parenchymal Echotexture: Moderately heterogenous Isthmus: 4 mm Right lobe: 5.3 x 2.9 x 1.7 cm Left lobe: 5.6 x 3.4 x 2.7 cm _________________________________________________________ Estimated total number of nodules >/= 1 cm: 1 Number of spongiform nodules >/=  2 cm not described below (TR1): 0 Number of mixed cystic and solid nodules >/= 1.5 cm not described below (TR2): 0 _________________________________________________________ Nodule # 1: Location: Left; Inferior Maximum size: 2.6 cm; Other 2 dimensions: 2.2 x 2.0 cm Composition: solid/almost completely solid (2) Echogenicity: hypoechoic (2) Shape: not taller-than-wide (0) Margins: ill-defined (0) Echogenic foci: none (0) ACR TI-RADS total points: 4. ACR TI-RADS risk category: TR4 (4-6 points). ACR TI-RADS recommendations: **Given size (>/= 1.5 cm) and appearance, fine needle aspiration of this moderately suspicious nodule should be considered based on  TI-RADS criteria. _________________________________________________________ Nonspecific moderate thyroid heterogeneity. Limited exam because of body habitus. No significant right thyroid abnormality. No hypervascularity or regional adenopathy. IMPRESSION: 2.6 cm left inferior TR 4 nodule meets criteria for biopsy as above. This correlates with the CT finding. The above is in keeping with the ACR TI-RADS recommendations - J Am Coll Radiol 2017;14:587-595. Electronically Signed   By: Jerilynn Mages.  Shick M.D.   On: 10/23/2019 09:37     CBC Recent Labs  Lab 10/22/19 1034 10/22/19 1034 10/23/19 0433 10/23/19 0723 10/24/19 0440 10/25/19 0435 10/26/19 0452  WBC 7.5   < > 8.5 7.7 8.2 7.9 7.5  HGB 11.6*   < > 11.3* 11.5* 10.9* 10.7* 11.0*  HCT 37.9   < > 36.5 37.3 36.0 35.2* 36.0  PLT 257   < > 264 254 260 249 253  MCV 85.4   < > 84.9 85.4 85.1 85.6 86.3  MCH 26.1   < > 26.3 26.3 25.8* 26.0 26.4  MCHC 30.6   < > 31.0 30.8 30.3 30.4 30.6  RDW 14.6   < > 14.6 14.6 14.5 14.3 14.3  LYMPHSABS 1.8  --   --   --   --   --   --  MONOABS 0.6  --   --   --   --   --   --   EOSABS 0.1  --   --   --   --   --   --   BASOSABS 0.0  --   --   --   --   --   --    < > = values in this interval not displayed.    Chemistries  Recent Labs  Lab 10/22/19 1034 10/23/19 0723 10/24/19 0836 10/25/19 0435  NA 136 137 135 134*  K 4.3 4.5 4.7 4.7  CL 102 103 101 101  CO2 23 25 25 24   GLUCOSE 97 141* 116* 126*  BUN 26* 17 21 24*  CREATININE 1.86* 1.56* 1.67* 1.66*  CALCIUM 8.8* 8.6* 9.0 8.8*   ------------------------------------------------------------------------------------------------------------------ No results for input(s): CHOL, HDL, LDLCALC, TRIG, CHOLHDL, LDLDIRECT in the last 72 hours.  Lab Results  Component Value Date   HGBA1C 7.6 (H) 10/22/2019   ------------------------------------------------------------------------------------------------------------------ No results for input(s): TSH, T4TOTAL,  T3FREE, THYROIDAB in the last 72 hours.  Invalid input(s): FREET3 ------------------------------------------------------------------------------------------------------------------ No results for input(s): VITAMINB12, FOLATE, FERRITIN, TIBC, IRON, RETICCTPCT in the last 72 hours.  Coagulation profile No results for input(s): INR, PROTIME in the last 168 hours.  No results for input(s): DDIMER in the last 72 hours.  Cardiac Enzymes No results for input(s): CKMB, TROPONINI, MYOGLOBIN in the last 168 hours.  Invalid input(s): CK ------------------------------------------------------------------------------------------------------------------    Component Value Date/Time   BNP 159.0 (H) 10/22/2019 1034     Roxan Hockey M.D on 10/26/2019 at 3:23 PM  Go to [email or web address] - for contact info  [facility] - Office  [phone]

## 2019-10-26 NOTE — NC FL2 (Signed)
[place] LEVEL OF CARE SCREENING TOOL     IDENTIFICATION  Patient Name: Kathleen [name] Birthdate: [birth year] Sex: female Admission Date (Current Location): 10/22/2019  [facility] and [place] Number:  [employer] and Address:  [place] [address], [place]      Provider Number: [id number]  Attending Physician Name and Address:  Roxan Hockey, MD  Relative Name and Phone Number:  Theodoro Parma (sister) Ph: [phone]    Current Level of Care: Hospital Recommended Level of Care: [facility] Prior Approval Number:    Date Approved/Denied:   PASRR Number: [id number]  Discharge Plan: SNF    Current Diagnoses: Patient Active Problem List   Diagnosis Date Noted  . Pulmonary embolism ([place]) 10/22/2019  . Acute respiratory failure with hypoxia ([place]) 10/22/2019  . CKD (chronic kidney disease), stage IIIb 10/22/2019  . Finger deformity, acquired 04/03/2018  . Knee pain, right 03/19/2018  . Seasonal allergies 09/17/2013  . Osteoarthritis of left knee 01/11/2012  . Morbid obesity ([place]) 02/03/2009  . Hyperlipidemia LDL goal <100 11/24/2007  . Type 2 diabetes mellitus with vascular disease ([place]) 06/09/2007  . Essential hypertension 04/26/2007  . GERD 04/26/2007    Orientation RESPIRATION BLADDER Height & Weight     Self, Time, Situation, Place  Normal Continent Weight: 225 lb 1.4 oz (102.1 kg) Height:  5\' 6"  (167.6 cm)  BEHAVIORAL SYMPTOMS/MOOD NEUROLOGICAL BOWEL NUTRITION STATUS      Continent Diet (Heart healthy/carb modified)  AMBULATORY STATUS COMMUNICATION OF NEEDS Skin   Limited Assist Verbally Normal                       Personal Care Assistance Level of Assistance  Bathing, Feeding, Dressing Bathing Assistance: Limited assistance Feeding assistance: Independent Dressing Assistance: Limited assistance     Functional Limitations Info  Sight, Hearing, Speech Sight Info:  Adequate Hearing Info: Adequate Speech Info: Adequate    SPECIAL CARE FACTORS FREQUENCY  PT (By licensed PT)     PT Frequency: 5x's/week              Contractures Contractures Info: Not present    Additional Factors Info  Code Status, Allergies Code Status Info: Full Allergies Info: NKA           Current Medications (10/26/2019):  This is the current hospital active medication list Current Facility-Administered Medications  Medication Dose Route Frequency Provider Last Rate Last Admin  . 0.9 %  sodium chloride infusion  250 mL Intravenous PRN Emokpae, Courage, MD      . acetaminophen (TYLENOL) tablet 650 mg  650 mg Oral Q6H PRN Emokpae, Courage, MD   650 mg at 10/25/19 2140   Or  . acetaminophen (TYLENOL) suppository 650 mg  650 mg Rectal Q6H PRN Emokpae, Courage, MD      . albuterol (PROVENTIL) (2.5 MG/3ML) 0.083% nebulizer solution 2.5 mg  2.5 mg Nebulization Q2H PRN Denton Brick, Courage, MD   2.5 mg at 10/24/19 0219  . amLODipine (NORVASC) tablet 10 mg  10 mg Oral Daily Denton Brick, Courage, MD   10 mg at 10/26/19 0944  . apixaban (ELIQUIS) tablet 10 mg  10 mg Oral BID Roxan Hockey, MD   10 mg at 10/26/19 0944   Followed by  . [START ON 11/01/2019] apixaban (ELIQUIS) tablet 5 mg  5 mg Oral BID Emokpae, Courage, MD      . Chlorhexidine Gluconate Cloth 2 % PADS 6 each  6  each Topical Daily Roxan Hockey, MD   6 each at 10/26/19 0944  . cholecalciferol (VITAMIN D3) tablet 1,000 Units  1,000 Units Oral Daily Roxan Hockey, MD   1,000 Units at 10/26/19 0943  . cloNIDine (CATAPRES) tablet 0.1 mg  0.1 mg Oral Q6H PRN Emokpae, Courage, MD      . cloNIDine (CATAPRES) tablet 0.3 mg  0.3 mg Oral TID Denton Brick, Courage, MD   0.3 mg at 10/26/19 0943  . diclofenac Sodium (VOLTAREN) 1 % topical gel 2 g  2 g Topical QID PRN Bunnie Pion Z, DO   2 g at 10/26/19 0945  . fentaNYL (SUBLIMAZE) injection 12.5-50 mcg  12.5-50 mcg Intravenous Q2H PRN Emokpae, Courage, MD      . hydrALAZINE  (APRESOLINE) injection 10 mg  10 mg Intravenous Q4H PRN Emokpae, Courage, MD      . insulin aspart (novoLOG) injection 0-10 Units  0-10 Units Subcutaneous TID WC Emokpae, Courage, MD      . insulin aspart (novoLOG) injection 0-5 Units  0-5 Units Subcutaneous QHS Emokpae, Courage, MD      . insulin aspart (novoLOG) injection 4 Units  4 Units Subcutaneous TID WC Emokpae, Courage, MD   4 Units at 10/26/19 1151  . insulin glargine (LANTUS) injection 40 Units  40 Units Subcutaneous Daily Roxan Hockey, MD   40 Units at 10/26/19 0951  . MEDLINE mouth rinse  15 mL Mouth Rinse BID Emokpae, Courage, MD   15 mL at 10/26/19 0945  . ondansetron (ZOFRAN) tablet 4 mg  4 mg Oral Q6H PRN Emokpae, Courage, MD       Or  . ondansetron (ZOFRAN) injection 4 mg  4 mg Intravenous Q6H PRN Emokpae, Courage, MD      . oxyCODONE (Oxy IR/ROXICODONE) immediate release tablet 5 mg  5 mg Oral Q4H PRN Denton Brick, Courage, MD   5 mg at 10/25/19 1557  . pantoprazole (PROTONIX) EC tablet 40 mg  40 mg Oral Daily Emokpae, Courage, MD   40 mg at 10/26/19 0944  . polyethylene glycol (MIRALAX / GLYCOLAX) packet 17 g  17 g Oral Daily Emokpae, Courage, MD   17 g at 10/26/19 0943  . pravastatin (PRAVACHOL) tablet 40 mg  40 mg Oral Daily Emokpae, Courage, MD   40 mg at 10/26/19 0943  . senna-docusate (Senokot-S) tablet 2 tablet  2 tablet Oral BID Roxan Hockey, MD   2 tablet at 10/26/19 0944  . sodium chloride flush (NS) 0.9 % injection 3 mL  3 mL Intravenous Q12H Emokpae, Courage, MD   3 mL at 10/26/19 0944  . sodium chloride flush (NS) 0.9 % injection 3 mL  3 mL Intravenous PRN Emokpae, Courage, MD      . traZODone (DESYREL) tablet 50 mg  50 mg Oral QHS PRN Roxan Hockey, MD   50 mg at 10/23/19 2141     Discharge Medications: Please see discharge summary for a list of discharge medications.  Relevant Imaging Results:  Relevant Lab Results:   Additional Information SSN#: [id number]  Sherie Don, LCSW

## 2019-10-27 DIAGNOSIS — K219 Gastro-esophageal reflux disease without esophagitis: Secondary | ICD-10-CM

## 2019-10-27 LAB — GLUCOSE, CAPILLARY
Glucose-Capillary: 83 mg/dL (ref 70–99)
Glucose-Capillary: 141 mg/dL — ABNORMAL HIGH (ref 70–99)
Glucose-Capillary: 120 mg/dL — ABNORMAL HIGH (ref 70–99)
Glucose-Capillary: 141 mg/dL — ABNORMAL HIGH (ref 70–99)

## 2019-10-27 LAB — CBC
HCT: 34.9 % — ABNORMAL LOW (ref 36.0–46.0)
Hemoglobin: 10.8 g/dL — ABNORMAL LOW (ref 12.0–15.0)
MCH: 26.2 pg (ref 26.0–34.0)
MCHC: 30.9 g/dL (ref 30.0–36.0)
MCV: 84.7 fL (ref 80.0–100.0)
Platelets: 266 10*3/uL (ref 150–400)
RBC: 4.12 MIL/uL (ref 3.87–5.11)
RDW: 14.2 % (ref 11.5–15.5)
WBC: 8.1 10*3/uL (ref 4.0–10.5)
nRBC: 0 % (ref 0.0–0.2)

## 2019-10-27 LAB — BASIC METABOLIC PANEL
Anion gap: 5 (ref 5–15)
BUN: 28 mg/dL — ABNORMAL HIGH (ref 8–23)
CO2: 26 mmol/L (ref 22–32)
Calcium: 8.9 mg/dL (ref 8.9–10.3)
Chloride: 99 mmol/L (ref 98–111)
Creatinine, Ser: 1.75 mg/dL — ABNORMAL HIGH (ref 0.44–1.00)
GFR calc Af Amer: 34 mL/min — ABNORMAL LOW (ref >60)
GFR calc non Af Amer: 29 mL/min — ABNORMAL LOW (ref >60)
Glucose, Bld: 102 mg/dL — ABNORMAL HIGH (ref 70–99)
Potassium: 5.1 mmol/L (ref 3.5–5.1)
Sodium: 130 mmol/L — ABNORMAL LOW (ref 135–145)

## 2019-10-27 MED ORDER — GABAPENTIN 100 MG PO CAPS
200.0000 mg | ORAL_CAPSULE | Freq: Two times a day (BID) | ORAL | Status: DC
  Administered 2019-10-27 – 2019-10-28 (×3): 200 mg via ORAL
  Filled 2019-10-27 (×3): qty 2

## 2019-10-27 NOTE — Progress Notes (Addendum)
Patient Demographics:    Kathleen [name], is a 69 y.o. female, DOB - Sep 07, 1950, YHC:[id number]  Admit date - 10/22/2019   Admitting Physician Jourdain Guay Denton Brick, MD  Outpatient Primary MD for the patient is Fayrene Helper, MD  LOS - 5   Chief Complaint  Patient presents with  . 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 female  With PMHx relevant for CKD IIIb, HTN, DM, HLD, GERD, obesity, OA presents to the ED with worsening shortness of breath, worsening DOE and hypoxia subsequently admitted on 10/22/2019 with acute PE   A/p  1)Acute Bil PE----large right-sided submassive PE  and small left upper lobe PE--- Echo with preserved EF of 65 to 70% with no significant right heart strain on echo -LE venous Dopplers without DVT -Patient may benefit from abdominal pelvic imaging -Patient's SHOB and DOE started prior to trip to the beach, symptoms worsened immediately after she arrived at the beach hotel could even walk up from the elevator to her hotel room --This PE may have been present prior to patient's for a trip to the beach symptoms only worsened on that particular day rather than started that day -Patient will need malignancy work-up as part of work-up of a hypercoagulable state -Last colonoscopy more than 10 years ago apparently,  last mammogram 2021 -CTA chest also showed 2.7  x 2.1 cm rounded well-defined low density is noted in the left lower lobe --patient will need outpatient PET scan Initially treated with IV heparin drip  transitioned to [place] on 10/25/19 --Hypoxia persist , unable to wean off oxygen at this time  2)CKD IIIb--Creatinine is down to 1.7 from 1.86 on admission --recent baseline usually around 1.5-1.6 -patient had contrast/CTA study so continue to hold Aldactone/hctz, hold ramipril -- renally adjust medications, avoid nephrotoxic agents / dehydration  / hypotension  - 3)DM2- A1c 8.3 , reflecting uncontrolled diabetes PTA had hypoglycemic episode in the ED, -PTA was on Lantus 60 mg twice daily, changed to 40 mg daily for now along with sliding scale coverage  4)HTN-continue amlodipine and clonidine ---hold ramipril and Aldactone/HCTZ due to kidney concerns -IV hydralazine as needed elevated BP  5)HLD--stable continue pravastatin and aspirin  6) obesity and osteoarthritis----outpatient management  as advised  7) acute hypoxic respiratory failure secondary to pulmonary embolism --- secondary to #1 above, manage as above #1 --Hypoxia persist , unable to wean off oxygen at this time  8)-De-Saturations when she falls asleep suggesting possible OSA=--outpatient sleep study advised  9)Thyroid Nodule---- 2.6 cm left inferior TR 4 nodule meets criteria for biopsy as above. This correlates with the CT finding. -Patient will need outpatient  thyroid biopsy  10) chronic normocytic normochromic anemia--baseline hemoglobin usually between 10 and 11--- etiology unclear -Monitor closely with anticoagulation use  Status is: Inpatient  Remains inpatient appropriate because:----Fatigue, generalized weakness, dyspnea on exertion, tachycardia and tachypnea with activity as well as hypoxia persist --- Awaiting insurance approval for transfer to SNF rehab  Dispo: The patient is from: Home  Anticipated d/c is to: with HH Vs  SNF  Anticipated d/c date is: > 3 days  Patient currently is  medically stable to d/c. Barriers:  --- Awaiting insurance approval for transfer to SNF rehab   Code Status : full  Family Communication:  (patient is alert, awake and coherent) --discussed with patient's sister [name]  Consults  :  PCCM  DVT Prophylaxis  : Eliquis Lab Results  Component Value Date   PLT 266 10/27/2019    Inpatient Medications  Scheduled Meds: . amLODipine  10 mg Oral Daily  . apixaban  10 mg Oral BID   Followed by  . [START ON 11/01/2019] apixaban  5 mg Oral BID  . Chlorhexidine Gluconate Cloth  6 each Topical Daily  . cholecalciferol  1,000 Units Oral Daily  . cloNIDine  0.3 mg Oral TID  . insulin aspart  0-10 Units Subcutaneous TID WC  . insulin aspart  0-5 Units Subcutaneous QHS  . insulin aspart  4 Units Subcutaneous TID WC  . insulin glargine  40 Units Subcutaneous Daily  . mouth rinse  15 mL Mouth Rinse BID  . pantoprazole  40 mg Oral Daily  . polyethylene glycol  17 g Oral Daily  . pravastatin  40 mg Oral Daily  . senna-docusate  2 tablet Oral BID  . sodium chloride flush  3 mL Intravenous Q12H   Continuous Infusions: . sodium chloride     PRN Meds:.sodium chloride, acetaminophen **OR** acetaminophen, albuterol, cloNIDine, diclofenac Sodium, fentaNYL (SUBLIMAZE) injection, hydrALAZINE, ondansetron **OR** ondansetron (ZOFRAN) IV, oxyCODONE, sodium chloride flush, traZODone    Anti-infectives (From admission, onward)   None        Objective:   Vitals:   10/26/19 2000 10/27/19 0400 10/27/19 0500 10/27/19 0746  BP: (!) 142/67 129/64    Pulse: 82 85  90  Resp: (!) 23 (!) 21  (!) 25  Temp: 98.8 F (37.1 C) 98.5 F (36.9 C)  98.5 F (36.9 C)  TempSrc: Oral Oral  Oral  SpO2: 93% 93%  92%  Weight:   102 kg   Height:        Wt Readings from Last 3 Encounters:  10/27/19 102 kg  09/03/19 105.9 kg  05/21/19 105.2 kg     Intake/Output Summary (Last  24 hours) at 10/27/2019 0919 Last data filed at 10/27/2019 0600 Gross per 24 hour  Intake 240 ml  Output 1550 ml  Net -1310 ml   Physical Exam  Gen:- Awake Alert, speaking in  sentences HEENT:- [place].AT, No sclera icterus Nose- [place] 2L/min Neck-Supple Neck,No JVD,.  Lungs-diminished in bases without wheezing CV- S1, S2 normal, regular  Abd-  +ve B.Sounds, Abd Soft, No tenderness,    Extremity/Skin:- No significant edema, pedal pulses present  Psych-affect is appropriate, oriented x3 Neuro-generalized weakness, no new focal deficits, no tremors   Data Review:   Micro Results Recent Results (from the past 240 hour(s))  SARS Coronavirus 2 by RT PCR (hospital order, performed in [facility] hospital lab) Nasopharyngeal Nasopharyngeal Swab     Status: None   Collection Time: 10/22/19 11:17 AM   Specimen: Nasopharyngeal Swab  Result Value Ref Range Status   SARS Coronavirus 2 NEGATIVE NEGATIVE Final    Comment: (NOTE) SARS-CoV-2 target nucleic acids are NOT DETECTED.  The SARS-CoV-2 RNA is generally detectable in upper and lower respiratory specimens during the acute phase of infection. The lowest concentration of SARS-CoV-2 viral copies this assay can detect is 250 copies / mL. A negative result does not preclude SARS-CoV-2 infection and should not be used as the sole basis for treatment or other patient management decisions.  A negative result may occur with improper specimen collection / handling, submission of specimen other than nasopharyngeal swab, presence of viral mutation(s) within the areas targeted by this assay, and inadequate number of viral copies (<250 copies / mL). A negative result must be combined with clinical observations, patient history, and epidemiological information.  Fact Sheet for Patients:   StrictlyIdeas.no  Fact Sheet for Healthcare Providers: BankingDealers.co.za  This test is not yet approved or   cleared by the [place] FDA and has been authorized for detection and/or diagnosis of SARS-CoV-2 by FDA under an Emergency Use Authorization (EUA).  This EUA will remain in effect (meaning this test can be used) for the duration of the COVID-19 declaration under Section 564(b)(1) of the Act, 21 U.S.C. section 360bbb-3(b)(1), unless the authorization is terminated or revoked sooner.  Performed at [facility], [address] [place], [place] [address]   MRSA PCR Screening     Status: None   Collection Time: 10/23/19  2:49 PM   Specimen: Nasal Mucosa; Nasopharyngeal  Result Value Ref Range Status   MRSA by PCR NEGATIVE NEGATIVE Final    Comment:        The GeneXpert MRSA Assay (FDA approved for NASAL specimens only), is one component of a comprehensive MRSA colonization surveillance program. It is not intended to diagnose MRSA infection nor to guide or monitor treatment for MRSA infections. Performed at [facility], [address] [place], [place] [address]     Radiology Reports CT Angio Chest PE W and/or Wo Contrast  Result Date: 10/22/2019 CLINICAL DATA:  Shortness of breath. EXAM: CT ANGIOGRAPHY CHEST WITH CONTRAST TECHNIQUE: Multidetector CT imaging of the chest was performed using the standard protocol during bolus administration of intravenous contrast. Multiplanar CT image reconstructions and MIPs were obtained to evaluate the vascular anatomy. CONTRAST:  127mL OMNIPAQUE IOHEXOL 350 MG/ML SOLN COMPARISON:  None. FINDINGS: Cardiovascular: Large right-sided pulmonary emboli are noted. RV/LV ratio of 1.0 is noted suggesting right heart strain. Smaller filling defect or embolus is seen in left upper lobe branch. Mild cardiomegaly is noted. No pericardial effusion is noted. Mediastinum/Nodes: No adenopathy is noted. Esophagus is unremarkable. 2.6 cm left thyroid nodule is noted. Lungs/Pleura: No pneumothorax or pleural effusion is noted. Minimal left posterior basilar  subsegmental atelectasis is noted. 2.7 x 2.1 cm rounded well-defined low density is noted in the left lower lobe with average Hounsfield measurement of 40. It is uncertain if this represents possible complex pericardial cyst or mass. Upper Abdomen: No acute abnormality. Musculoskeletal: No chest wall abnormality. No acute or significant osseous findings. Review of the MIP images confirms the above findings. IMPRESSION: 1. Large right-sided pulmonary emboli are noted with CT evidence of right heart strain (RV/LV Ratio = 1.0) consistent with at least submassive (intermediate risk) PE. The presence of right heart strain has been associated with an increased risk of morbidity and mortality. Critical Value/emergent results were called by telephone at the time of interpretation on 10/22/2019 at 1:44 pm to provider  Dr. Kathrynn Humble, Who verbally acknowledged these results. 2. 2.6 cm left thyroid nodule is noted. Recommend thyroid US. (Ref: J Am Coll Radiol. 2015 Feb;12(2): 143-50). 3. 2.7 x 2.1 cm rounded well-defined low density is noted in the left lower lobe with average Hounsfield measurement of 40. It is uncertain if this represents possible complex pericardial cyst or mass. PET scan is recommended for further evaluation. Electronically Signed   By: Marijo Conception M.D.   On: 10/22/2019 13:45   US Venous Img Lower Bilateral (DVT)  Result Date: 10/22/2019 CLINICAL DATA:  Pulmonary embolism EXAM: BILATERAL LOWER EXTREMITY VENOUS DOPPLER ULTRASOUND TECHNIQUE: Gray-scale sonography with graded compression, as well as color Doppler and duplex ultrasound were performed to evaluate the lower extremity deep venous systems from the level of the common femoral vein and including the common femoral, femoral, profunda femoral, popliteal and calf veins including the posterior tibial, peroneal and gastrocnemius veins when visible. The superficial great saphenous vein was also interrogated. Spectral Doppler was utilized to evaluate  flow at rest and with distal augmentation maneuvers in the common femoral, femoral and popliteal veins. COMPARISON:  None. FINDINGS: RIGHT LOWER EXTREMITY Common Femoral Vein: No evidence of thrombus. Normal compressibility, respiratory phasicity and response to augmentation. Saphenofemoral Junction: No evidence of thrombus. Normal compressibility and flow on color Doppler imaging. Profunda Femoral Vein: No evidence of thrombus. Normal compressibility and flow on color Doppler imaging. Femoral Vein: No evidence of thrombus. Normal compressibility, respiratory phasicity and response to augmentation. Popliteal Vein: No evidence of thrombus. Normal compressibility, respiratory phasicity and response to augmentation. Calf Veins: No evidence of thrombus. Normal compressibility and flow on color Doppler imaging. Superficial Great Saphenous Vein: No evidence of thrombus. Normal compressibility. Venous Reflux:  None. Other Findings:  None. LEFT LOWER EXTREMITY Common Femoral Vein: No evidence of thrombus. Normal compressibility, respiratory phasicity and response to augmentation. Saphenofemoral Junction: No evidence of thrombus. Normal compressibility and flow on color Doppler imaging. Profunda Femoral Vein: No evidence of thrombus. Normal compressibility and flow on color Doppler imaging. Femoral Vein: No evidence of thrombus. Normal compressibility, respiratory phasicity and response to augmentation. Popliteal Vein: No evidence of thrombus. Normal compressibility, respiratory phasicity and response to augmentation. Calf Veins: No evidence of thrombus. Normal compressibility and flow on color Doppler imaging. Superficial Great Saphenous Vein: No evidence of thrombus. Normal compressibility. Venous Reflux:  None. Other Findings:  None. IMPRESSION: No evidence of deep venous thrombosis in either lower extremity. Electronically Signed   By: Franchot Gallo M.D.   On: 10/22/2019 15:53   DG Chest Port 1 View  Result Date:  10/22/2019 CLINICAL DATA:  Shortness of breath for 5 days. EXAM: PORTABLE CHEST 1 VIEW COMPARISON:  Radiographs 05/26/2011 and 03/15/2008. FINDINGS: 1042 hours. The heart size is stable at the upper limits of normal for portable AP technique. The mediastinal contours are normal. The pulmonary vascularity is normal, and the lungs remain clear. There is no pleural effusion or pneumothorax. No acute osseous findings are seen. There are degenerative changes throughout the spine and at both shoulders. Telemetry leads overlie the chest. IMPRESSION: No active cardiopulmonary process. Electronically Signed   By: Richardean Sale M.D.   On: 10/22/2019 10:50   ECHOCARDIOGRAM COMPLETE  Result Date: 10/22/2019    ECHOCARDIOGRAM REPORT   Patient Name:   SARISSA DERN [facility] Date of Exam: 10/22/2019 Medical Rec #:  [id number]       Height:       66.0 in Accession #:    [id number]  Weight:       230.0 lb Date of Birth:  1951-02-04      BSA:          2.122 m Patient Age:    9 years        BP:           150/84 mmHg Patient Gender: F               HR:           101 bpm. Exam Location:  [facility] Procedure: 2D Echo, Cardiac Doppler and Color Doppler Indications:    Pulmonary Embolus 415.19 / I26.99  History:        Patient has no prior history of Echocardiogram examinations.                 Risk Factors:Diabetes, Dyslipidemia and Hypertension. Acute                 respiratory failure with hypoxia,CKD (chronic kidney disease)                 stage 4,Morbid obesity,Pulmonary embolism.  Sonographer:    Alvino Chapel RCS Referring Phys: [phone] Roberth Berling IMPRESSIONS  1. Left ventricular ejection fraction, by estimation, is 65 to 70%. The left ventricle has normal function. The left ventricle has no regional wall motion abnormalities. There is mild left ventricular hypertrophy.  2. Right ventricular systolic function is mildly reduced. The right ventricular size is mildly enlarged.  3. Right atrial size was mildly dilated.  4. The  mitral valve is normal in structure. No evidence of mitral valve regurgitation.  5. The aortic valve is tricuspid. Aortic valve regurgitation is not visualized. Mild aortic valve sclerosis is present, with no evidence of aortic valve stenosis.  6. The inferior vena cava is normal in size with greater than 50% respiratory variability, suggesting right atrial pressure of 3 mmHg. FINDINGS  Left Ventricle: Left ventricular ejection fraction, by estimation, is 65 to 70%. The left ventricle has normal function. The left ventricle has no regional wall motion abnormalities. The left ventricular internal cavity size was normal in size. There is  mild left ventricular hypertrophy. Left ventricular diastolic function could not be evaluated. Right Ventricle: The right ventricular size is mildly enlarged. Right vetricular wall thickness was not assessed. Right ventricular systolic function is mildly reduced. Left Atrium: Left atrial size was normal in size. Right Atrium: Right atrial size was mildly dilated. Pericardium: There is no evidence of pericardial effusion. Mitral Valve: The mitral valve is normal in structure. No evidence of mitral valve regurgitation. Tricuspid Valve: The tricuspid valve is normal in structure. Tricuspid valve regurgitation is trivial. Aortic Valve: The aortic valve is tricuspid. Aortic valve regurgitation is not visualized. Mild aortic valve sclerosis is present, with no evidence of aortic valve stenosis. Pulmonic Valve: The pulmonic valve was grossly normal. Pulmonic valve regurgitation is not visualized. Aorta: The aortic root is normal in size and structure. Venous: The inferior vena cava is normal in size with greater than 50% respiratory variability, suggesting right atrial pressure of 3 mmHg. IAS/Shunts: No atrial level shunt detected by color flow Doppler.  LEFT VENTRICLE PLAX 2D LVIDd:         3.43 cm  Diastology LVIDs:         2.14 cm  LV e' lateral:   7.62 cm/s LV PW:         1.15 cm  LV  E/e' lateral: 10.2 LV IVS:  1.31 cm  LV e' medial:    6.74 cm/s LVOT diam:     1.90 cm  LV E/e' medial:  11.6 LV SV:         48 LV SV Index:   23 LVOT Area:     2.84 cm  RIGHT VENTRICLE RV S prime:     13.20 cm/s TAPSE (M-mode): 1.9 cm LEFT ATRIUM             Index       RIGHT ATRIUM           Index LA diam:        2.60 cm 1.23 cm/m  RA Area:     13.90 cm LA Vol (A2C):   30.0 ml 14.14 ml/m RA Volume:   29.80 ml  14.04 ml/m LA Vol (A4C):   29.8 ml 14.04 ml/m LA Biplane Vol: 29.8 ml 14.04 ml/m  AORTIC VALVE LVOT Vmax:   128.00 cm/s LVOT Vmean:  78.900 cm/s LVOT VTI:    0.171 m  AORTA Ao Root diam: 3.10 cm MITRAL VALVE MV Area (PHT): 2.54 cm     SHUNTS MV Decel Time: 299 msec     Systemic VTI:  0.17 m MV E velocity: 78.10 cm/s   Systemic Diam: 1.90 cm MV A velocity: 108.00 cm/s MV E/A ratio:  0.72 Dorris Carnes MD Electronically signed by Dorris Carnes MD Signature Date/Time: 10/22/2019/4:36:41 PM    Final    US THYROID  Result Date: 10/23/2019 CLINICAL DATA:  Thyroid nodule by CT EXAM: THYROID ULTRASOUND TECHNIQUE: Ultrasound examination of the thyroid gland and adjacent soft tissues was performed. COMPARISON:  10/22/2019 FINDINGS: Parenchymal Echotexture: Moderately heterogenous Isthmus: 4 mm Right lobe: 5.3 x 2.9 x 1.7 cm Left lobe: 5.6 x 3.4 x 2.7 cm _________________________________________________________ Estimated total number of nodules >/= 1 cm: 1 Number of spongiform nodules >/=  2 cm not described below (TR1): 0 Number of mixed cystic and solid nodules >/= 1.5 cm not described below (TR2): 0 _________________________________________________________ Nodule # 1: Location: Left; Inferior Maximum size: 2.6 cm; Other 2 dimensions: 2.2 x 2.0 cm Composition: solid/almost completely solid (2) Echogenicity: hypoechoic (2) Shape: not taller-than-wide (0) Margins: ill-defined (0) Echogenic foci: none (0) ACR TI-RADS total points: 4. ACR TI-RADS risk category: TR4 (4-6 points). ACR TI-RADS recommendations:  **Given size (>/= 1.5 cm) and appearance, fine needle aspiration of this moderately suspicious nodule should be considered based on TI-RADS criteria. _________________________________________________________ Nonspecific moderate thyroid heterogeneity. Limited exam because of body habitus. No significant right thyroid abnormality. No hypervascularity or regional adenopathy. IMPRESSION: 2.6 cm left inferior TR 4 nodule meets criteria for biopsy as above. This correlates with the CT finding. The above is in keeping with the ACR TI-RADS recommendations - J Am Coll Radiol 2017;14:587-595. Electronically Signed   By: Jerilynn Mages.  Shick M.D.   On: 10/23/2019 09:37     CBC Recent Labs  Lab 10/22/19 1034 10/23/19 0433 10/23/19 0723 10/24/19 0440 10/25/19 0435 10/26/19 0452 10/27/19 0338  WBC 7.5   < > 7.7 8.2 7.9 7.5 8.1  HGB 11.6*   < > 11.5* 10.9* 10.7* 11.0* 10.8*  HCT 37.9   < > 37.3 36.0 35.2* 36.0 34.9*  PLT 257   < > 254 260 249 253 266  MCV 85.4   < > 85.4 85.1 85.6 86.3 84.7  MCH 26.1   < > 26.3 25.8* 26.0 26.4 26.2  MCHC 30.6   < > 30.8 30.3 30.4 30.6 30.9  RDW  14.6   < > 14.6 14.5 14.3 14.3 14.2  LYMPHSABS 1.8  --   --   --   --   --   --   MONOABS 0.6  --   --   --   --   --   --   EOSABS 0.1  --   --   --   --   --   --   BASOSABS 0.0  --   --   --   --   --   --    < > = values in this interval not displayed.    Chemistries  Recent Labs  Lab 10/22/19 1034 10/23/19 0723 10/24/19 0836 10/25/19 0435 10/27/19 0338  NA 136 137 135 134* 130*  K 4.3 4.5 4.7 4.7 5.1  CL 102 103 101 101 99  CO2 23 25 25 24 26   GLUCOSE 97 141* 116* 126* 102*  BUN 26* 17 21 24* 28*  CREATININE 1.86* 1.56* 1.67* 1.66* 1.75*  CALCIUM 8.8* 8.6* 9.0 8.8* 8.9   ------------------------------------------------------------------------------------------------------------------ No results for input(s): CHOL, HDL, LDLCALC, TRIG, CHOLHDL, LDLDIRECT in the last 72 hours.  Lab Results  Component Value Date    HGBA1C 7.6 (H) 10/22/2019   ------------------------------------------------------------------------------------------------------------------ No results for input(s): TSH, T4TOTAL, T3FREE, THYROIDAB in the last 72 hours.  Invalid input(s): FREET3 ------------------------------------------------------------------------------------------------------------------ No results for input(s): VITAMINB12, FOLATE, FERRITIN, TIBC, IRON, RETICCTPCT in the last 72 hours.  Coagulation profile No results for input(s): INR, PROTIME in the last 168 hours.  No results for input(s): DDIMER in the last 72 hours.  Cardiac Enzymes No results for input(s): CKMB, TROPONINI, MYOGLOBIN in the last 168 hours.  Invalid input(s): CK ------------------------------------------------------------------------------------------------------------------    Component Value Date/Time   BNP 159.0 (H) 10/22/2019 1034     Roxan Hockey M.D on 10/27/2019 at 9:19 AM  Go to [email or web address] - for contact info  [facility] - Office  [phone]

## 2019-10-27 NOTE — TOC Progression Note (Signed)
Transition of Care [facility]) - Progression Note    Patient Details  Name: Kathleen [name] MRN: [id number] Date of Birth: 26-May-1950  Transition of Care [facility]) CM/SW Contact  Roda Shutters Margretta Sidle, RN Phone Number: 10/27/2019, 4:14 PM  Clinical Narrative:    Pt has ins Josem Kaufmann UU#[id number]. Has chosen bed at [facility] and per Jackelyn Poling can admit on 10/28/19.    Expected Discharge Plan: [place] Barriers to Discharge: No Barriers Identified  Expected Discharge Plan and Services Expected Discharge Plan: [place] In-house Referral: Clinical Social Work Discharge Planning Services: NA Post Acute Care Choice: [place] Living arrangements for the past 2 months: [place] Expected Discharge Date: 10/27/19               DME Arranged: N/A DME Agency: NA Date DME Agency Contacted: 10/25/19 Time DME Agency Contacted: 0900 Representative spoke with at DME Agency: [place] ([place]) Interventions    Readmission Risk Interventions No flowsheet data found.

## 2019-10-28 DIAGNOSIS — K219 Gastro-esophageal reflux disease without esophagitis: Secondary | ICD-10-CM | POA: Diagnosis not present

## 2019-10-28 DIAGNOSIS — E785 Hyperlipidemia, unspecified: Secondary | ICD-10-CM | POA: Diagnosis not present

## 2019-10-28 DIAGNOSIS — R2689 Other abnormalities of gait and mobility: Secondary | ICD-10-CM | POA: Diagnosis not present

## 2019-10-28 DIAGNOSIS — R29898 Other symptoms and signs involving the musculoskeletal system: Secondary | ICD-10-CM | POA: Diagnosis not present

## 2019-10-28 DIAGNOSIS — M129 Arthropathy, unspecified: Secondary | ICD-10-CM | POA: Diagnosis not present

## 2019-10-28 DIAGNOSIS — E1169 Type 2 diabetes mellitus with other specified complication: Secondary | ICD-10-CM | POA: Diagnosis not present

## 2019-10-28 DIAGNOSIS — N1832 Chronic kidney disease, stage 3b: Secondary | ICD-10-CM | POA: Diagnosis not present

## 2019-10-28 DIAGNOSIS — Z743 Need for continuous supervision: Secondary | ICD-10-CM | POA: Diagnosis not present

## 2019-10-28 DIAGNOSIS — J9601 Acute respiratory failure with hypoxia: Secondary | ICD-10-CM | POA: Diagnosis not present

## 2019-10-28 DIAGNOSIS — N183 Chronic kidney disease, stage 3 unspecified: Secondary | ICD-10-CM | POA: Diagnosis not present

## 2019-10-28 DIAGNOSIS — E119 Type 2 diabetes mellitus without complications: Secondary | ICD-10-CM | POA: Diagnosis not present

## 2019-10-28 DIAGNOSIS — D649 Anemia, unspecified: Secondary | ICD-10-CM | POA: Diagnosis not present

## 2019-10-28 DIAGNOSIS — R918 Other nonspecific abnormal finding of lung field: Secondary | ICD-10-CM | POA: Diagnosis not present

## 2019-10-28 DIAGNOSIS — I10 Essential (primary) hypertension: Secondary | ICD-10-CM | POA: Diagnosis not present

## 2019-10-28 DIAGNOSIS — M1990 Unspecified osteoarthritis, unspecified site: Secondary | ICD-10-CM | POA: Diagnosis not present

## 2019-10-28 DIAGNOSIS — Z7401 Bed confinement status: Secondary | ICD-10-CM | POA: Diagnosis not present

## 2019-10-28 DIAGNOSIS — I2699 Other pulmonary embolism without acute cor pulmonale: Secondary | ICD-10-CM | POA: Diagnosis not present

## 2019-10-28 DIAGNOSIS — M6281 Muscle weakness (generalized): Secondary | ICD-10-CM | POA: Diagnosis not present

## 2019-10-28 DIAGNOSIS — I2609 Other pulmonary embolism with acute cor pulmonale: Secondary | ICD-10-CM | POA: Diagnosis not present

## 2019-10-28 DIAGNOSIS — E1159 Type 2 diabetes mellitus with other circulatory complications: Secondary | ICD-10-CM | POA: Diagnosis not present

## 2019-10-28 LAB — CBC
HCT: 32.6 % — ABNORMAL LOW (ref 36.0–46.0)
Hemoglobin: 10.1 g/dL — ABNORMAL LOW (ref 12.0–15.0)
MCH: 26.4 pg (ref 26.0–34.0)
MCHC: 31 g/dL (ref 30.0–36.0)
MCV: 85.3 fL (ref 80.0–100.0)
Platelets: 258 10*3/uL (ref 150–400)
RBC: 3.82 MIL/uL — ABNORMAL LOW (ref 3.87–5.11)
RDW: 14.1 % (ref 11.5–15.5)
WBC: 8.2 10*3/uL (ref 4.0–10.5)
nRBC: 0 % (ref 0.0–0.2)

## 2019-10-28 LAB — GLUCOSE, CAPILLARY
Glucose-Capillary: 101 mg/dL — ABNORMAL HIGH (ref 70–99)
Glucose-Capillary: 188 mg/dL — ABNORMAL HIGH (ref 70–99)

## 2019-10-28 MED ORDER — TRAZODONE HCL 50 MG PO TABS: 50.0000 mg | ORAL_TABLET | Freq: Every evening | ORAL | 2 refills | Status: DC | PRN

## 2019-10-28 MED ORDER — HYDROCODONE-ACETAMINOPHEN 5-325 MG PO TABS: 1.0000 | ORAL_TABLET | Freq: Four times a day (QID) | ORAL | 0 refills | Status: DC | PRN

## 2019-10-28 MED ORDER — ALBUTEROL SULFATE (2.5 MG/3ML) 0.083% IN NEBU: 2.5000 mg | INHALATION_SOLUTION | RESPIRATORY_TRACT | 12 refills | Status: DC | PRN

## 2019-10-28 MED ORDER — SENNOSIDES-DOCUSATE SODIUM 8.6-50 MG PO TABS: 2.0000 | ORAL_TABLET | Freq: Two times a day (BID) | ORAL | 2 refills | Status: DC

## 2019-10-28 MED ORDER — POLYETHYLENE GLYCOL 3350 17 G PO PACK: 17.0000 g | PACK | Freq: Every day | ORAL | 1 refills | Status: DC

## 2019-10-28 MED ORDER — LANTUS SOLOSTAR 100 UNIT/ML ~~LOC~~ SOPN: 40.0000 [IU] | PEN_INJECTOR | Freq: Every day | SUBCUTANEOUS | 0 refills | Status: DC

## 2019-10-28 MED ORDER — HUMALOG KWIKPEN 200 UNIT/ML ~~LOC~~ SOPN: 0.0000 [IU] | PEN_INJECTOR | Freq: Three times a day (TID) | SUBCUTANEOUS | 3 refills | Status: DC

## 2019-10-28 MED ORDER — ACETAMINOPHEN 325 MG PO TABS: 650.0000 mg | ORAL_TABLET | Freq: Four times a day (QID) | ORAL | 0 refills | Status: DC | PRN

## 2019-10-28 MED ORDER — PANTOPRAZOLE SODIUM 40 MG PO TBEC: 40.0000 mg | DELAYED_RELEASE_TABLET | Freq: Every day | ORAL | 3 refills | Status: DC

## 2019-10-28 MED ORDER — ELIQUIS DVT/PE STARTER PACK 5 MG PO TBPK
ORAL_TABLET | ORAL | 0 refills | Status: DC
Start: 2019-10-28 — End: 2019-11-27

## 2019-10-28 MED ORDER — GABAPENTIN 100 MG PO CAPS: 100.0000 mg | ORAL_CAPSULE | Freq: Two times a day (BID) | ORAL | 3 refills | Status: DC

## 2019-10-28 NOTE — TOC Transition Note (Signed)
Transition of Care [facility]) - CM/SW Discharge Note   Patient Details  Name: Kathleen [name] MRN: [id number] Date of Birth: Oct 02, 1950  [facility] CM/SW Contact:  Natasha Bence, LCSW Phone Number: 10/28/2019, 1:54 PM   Clinical Narrative:   Patient is a 69 year old female admited for pulmonary embolism. Patient will be discharging to [place]. Family has been notified.     Final next level of care: Skilled Nursing Facility Barriers to Discharge: Barriers Resolved   Patient Goals and CMS Choice Patient states their goals for this hospitalization and ongoing recovery are:: Rehab at [facility] CMS Medicare.gov Compare Post Acute Care list provided to:: Patient Choice offered to / list presented to : Patient  Discharge Placement PASRR number recieved: 10/28/19            Patient chooses bed at: [facility] Patient to be transferred to facility by: EMS Name of family member notified: Theodoro Parma Patient and family notified of of transfer: 10/28/19  Discharge Plan and Services In-house Referral: Clinical Social Work Discharge Planning Services: NA Post Acute Care Choice: [place]          DME Arranged: N/A DME Agency: NA Date DME Agency Contacted: 10/25/19 Time DME Agency Contacted: 0900 Representative spoke with at DME Agency: [place] [facility] Interventions     Readmission Risk Interventions No flowsheet data found.

## 2019-10-28 NOTE — Discharge Instructions (Signed)
1)Avoid ibuprofen/Advil/Aleve/Motrin/Goody Powders/Naproxen/BC powders/Meloxicam/Diclofenac/Indomethacin and other Nonsteroidal anti-inflammatory medications as these will make you more likely to bleed and can cause stomach ulcers, can also cause Kidney problems.   2)insulin Lispro (HumaLOG) injection 0-10 Units 0-10 Units Subcutaneous, 3 times daily with meals CBG < 70: Implement Hypoglycemia Standing Orders and refer to Hypoglycemia Standing Orders sidebar report  CBG 70 - 120: 0 unit CBG 121 - 150: 0 unit  CBG 151 - 200: 1 unit CBG 201 - 250: 2 units CBG 251 - 300: 4 units CBG 301 - 350: 6 units  CBG 351 - 400: 8 units  CBG > 400: 10 units  3)Continue oxygen at 2L/min continously  4)CBC and BMP every Wednesday 10/31/19  5)outpatient thyroid biopsy--- due to thyroid nodule--this can be done over the next 2 to 3 months  6) outpatient sleep study due to concerns about possible obstructive sleep apnea--you may need CPAP machine  7)2.7 x 2.1 cm rounded well-defined low density is noted in the left lower lobe--patient will need outpatient PET scan    Pulmonary Embolism  A pulmonary embolism (PE) is a sudden blockage or decrease of blood flow in one or both lungs. Most blockages come from a blood clot that forms in the vein of a lower leg, thigh, or arm (deep vein thrombosis, DVT) and travels to the lungs. A clot is blood that has thickened into a gel or solid. PE is a dangerous and life-threatening condition that needs to be treated right away. What are the causes? This condition is usually caused by a blood clot that forms in a vein and moves to the lungs. In rare cases, it may be caused by air, fat, part of a tumor, or other tissue that moves through the veins and into the lungs. What increases the risk? The following factors may make you more likely to develop this condition:  Experiencing a traumatic injury, such as breaking a hip or leg.  Having: ? A spinal cord injury. ? Orthopedic  surgery, especially hip or knee replacement. ? Any major surgery. ? A stroke. ? DVT. ? Blood clots or blood clotting disease. ? Long-term (chronic) lung or heart disease. ? Cancer treated with chemotherapy. ? A central venous catheter.  Taking medicines that contain estrogen. These include birth control pills and hormone replacement therapy.  Being: ? Pregnant. ? In the period of time after your baby is delivered (postpartum). ? Older than age 95. ? Overweight. ? A smoker, especially if you have other risks. What are the signs or symptoms? Symptoms of this condition usually start suddenly and include:  Shortness of breath during activity or at rest.  Coughing, coughing up blood, or coughing up blood-tinged mucus.  Chest pain that is often worse with deep breaths.  Rapid or irregular heartbeat.  Feeling light-headed or dizzy.  Fainting.  Feeling anxious.  Fever.  Sweating.  Pain and swelling in a leg. This is a symptom of DVT, which can lead to PE. How is this diagnosed? This condition may be diagnosed based on:  Your medical history.  A physical exam.  Blood tests.  CT pulmonary angiogram. This test checks blood flow in and around your lungs.  Ventilation-perfusion scan, also called a lung VQ scan. This test measures air flow and blood flow to the lungs.  An ultrasound of the legs. How is this treated? Treatment for this condition depends on many factors, such as the cause of your PE, your risk for bleeding or developing more clots,  and other medical conditions you have. Treatment aims to remove, dissolve, or stop blood clots from forming or growing larger. Treatment may include:  Medicines, such as: ? Blood thinning medicines (anticoagulants) to stop clots from forming. ? Medicines that dissolve clots (thrombolytics).  Procedures, such as: ? Using a flexible tube to remove a blood clot (embolectomy) or to deliver medicine to destroy it (catheter-directed  thrombolysis). ? Inserting a filter into a large vein that carries blood to the heart (inferior vena cava). This filter (vena cava filter) catches blood clots before they reach the lungs. ? Surgery to remove the clot (surgical embolectomy). This is rare. You may need a combination of immediate, long-term (up to 3 months after diagnosis), and extended (more than 3 months after diagnosis) treatments. Your treatment may continue for several months (maintenance therapy). You and your health care provider will work together to choose the treatment program that is best for you. Follow these instructions at home: Medicines  Take over-the-counter and prescription medicines only as told by your health care provider.  If you are taking an anticoagulant medicine: ? Take the medicine every day at the same time each day. ? Understand what foods and drugs interact with your medicine. ? Understand the side effects of this medicine, including excessive bruising or bleeding. Ask your health care provider or pharmacist about other side effects. General instructions  Wear a medical alert bracelet or carry a medical alert card that says you have had a PE and lists what medicines you take.  Ask your health care provider when you may return to your normal activities. Avoid sitting or lying for a long time without moving.  Maintain a healthy weight. Ask your health care provider what weight is healthy for you.  Do not use any products that contain nicotine or tobacco, such as cigarettes, e-cigarettes, and chewing tobacco. If you need help quitting, ask your health care provider.  Talk with your health care provider about any travel plans. It is important to make sure that you are still able to take your medicine while on trips.  Keep all follow-up visits as told by your health care provider. This is important. Contact a health care provider if:  You missed a dose of your blood thinner medicine. Get help right  away if:  You have: ? New or increased pain, swelling, warmth, or redness in an arm or leg. ? Numbness or tingling in an arm or leg. ? Shortness of breath during activity or at rest. ? A fever. ? Chest pain. ? A rapid or irregular heartbeat. ? A severe headache. ? Vision changes. ? A serious fall or accident, or you hit your head. ? Stomach (abdominal) pain. ? Blood in your vomit, stool, or urine. ? A cut that will not stop bleeding.  You cough up blood.  You feel light-headed or dizzy.  You cannot move your arms or legs.  You are confused or have memory loss. These symptoms may represent a serious problem that is an emergency. Do not wait to see if the symptoms will go away. Get medical help right away. Call your local emergency services (911 in the [place] Do not drive yourself to the hospital. Summary  A pulmonary embolism (PE) is a sudden blockage or decrease of blood flow in one or both lungs. PE is a dangerous and life-threatening condition that needs to be treated right away.  Treatments for this condition usually include medicines to thin your blood (anticoagulants) or medicines to  break apart blood clots (thrombolytics).  If you are given blood thinners, it is important to take the medicine every day at the same time each day.  Understand what foods and drugs interact with any medicines that you are taking.  If you have signs of PE or DVT, call your local emergency services (911 in the [place] This information is not intended to replace advice given to you by your health care provider. Make sure you discuss any questions you have with your health care provider. Document Revised: 02/01/2018 Document Reviewed: 02/01/2018 Elsevier Patient Education  2020 [place] on my medicine - ELIQUIS (apixaban)  This medication education was reviewed with me or my healthcare representative as part of my discharge preparation.   Why was Eliquis prescribed for  you? Eliquis was prescribed to treat blood clots that may have been found in the veins of your legs (deep vein thrombosis) or in your lungs (pulmonary embolism) and to reduce the risk of them occurring again.  What do You need to know about Eliquis ? The starting dose is 10 mg (two 5 mg tablets) taken TWICE daily for the FIRST SEVEN (7) DAYS, then on (enter date)   the dose is reduced to ONE 5 mg tablet taken TWICE daily.  Eliquis may be taken with or without food.   Try to take the dose about the same time in the morning and in the evening. If you have difficulty swallowing the tablet whole please discuss with your pharmacist how to take the medication safely.  Take Eliquis exactly as prescribed and DO NOT stop taking Eliquis without talking to the doctor who prescribed the medication.  Stopping may increase your risk of developing a new blood clot.  Refill your prescription before you run out.  After discharge, you should have regular check-up appointments with your healthcare provider that is prescribing your Eliquis.    What do you do if you miss a dose? If a dose of ELIQUIS is not taken at the scheduled time, take it as soon as possible on the same day and twice-daily administration should be resumed. The dose should not be doubled to make up for a missed dose.  Important Safety Information A possible side effect of Eliquis is bleeding. You should call your healthcare provider right away if you experience any of the following: ? Bleeding from an injury or your nose that does not stop. ? Unusual colored urine (red or dark brown) or unusual colored stools (red or black). ? Unusual bruising for unknown reasons. ? A serious fall or if you hit your head (even if there is no bleeding).  Some medicines may interact with Eliquis and might increase your risk of bleeding or clotting while on Eliquis. To help avoid this, consult your healthcare provider or pharmacist prior to using any new  prescription or non-prescription medications, including herbals, vitamins, non-steroidal anti-inflammatory drugs (NSAIDs) and supplements.  -- 1)Avoid ibuprofen/Advil/Aleve/Motrin/Goody Powders/Naproxen/BC powders/Meloxicam/Diclofenac/Indomethacin and other Nonsteroidal anti-inflammatory medications as these will make you more likely to bleed and can cause stomach ulcers, can also cause Kidney problems.   2)insulin Lispro (HumaLOG) injection 0-10 Units 0-10 Units Subcutaneous, 3 times daily with meals CBG < 70: Implement Hypoglycemia Standing Orders and refer to Hypoglycemia Standing Orders sidebar report  CBG 70 - 120: 0 unit CBG 121 - 150: 0 unit  CBG 151 - 200: 1 unit CBG 201 - 250: 2 units CBG 251 - 300: 4 units CBG 301 - 350: 6 units  CBG 351 - 400: 8 units  CBG > 400: 10 units  3)Continue oxygen at 2L/min continously  4)CBC and BMP every Wednesday 10/31/19  5)outpatient thyroid biopsy--- due to thyroid nodule--this can be done over the next 2 to 3 months  6) outpatient sleep study due to concerns about possible obstructive sleep apnea--you may need CPAP machine  7)2.7 x 2.1 cm rounded well-defined low density is noted in the left lower lobe--patient will need outpatient PET scan

## 2019-10-28 NOTE — Discharge Summary (Signed)
Kathleen [name], is a 69 y.o. female  DOB [birth year]  MRN [id number].  Admission date:  10/22/2019  Admitting Physician  Sawyer Mentzer Denton Brick, MD  Discharge Date:  10/28/2019   Primary MD  Fayrene Helper, MD  Recommendations for primary care physician for things to follow:   1)Avoid ibuprofen/Advil/Aleve/Motrin/Goody Powders/Naproxen/BC powders/Meloxicam/Diclofenac/Indomethacin and other Nonsteroidal anti-inflammatory medications as these will make you more likely to bleed and can cause stomach ulcers, can also cause Kidney problems.   2)insulin Lispro (HumaLOG) injection 0-10 Units 0-10 Units Subcutaneous, 3 times daily with meals CBG < 70: Implement Hypoglycemia Standing Orders and refer to Hypoglycemia Standing Orders sidebar report  CBG 70 - 120: 0 unit CBG 121 - 150: 0 unit  CBG 151 - 200: 1 unit CBG 201 - 250: 2 units CBG 251 - 300: 4 units CBG 301 - 350: 6 units  CBG 351 - 400: 8 units  CBG > 400: 10 units  3)Continue oxygen at 2L/min continously  4)CBC and BMP every Wednesday 10/31/19  5)outpatient thyroid biopsy--- due to thyroid nodule--this can be done over the next 2 to 3 months  6) outpatient sleep study due to concerns about possible obstructive sleep apnea--you may need CPAP machine  7)2.7 x 2.1 cm rounded well-defined low density is noted in the left lower lobe--patient will need outpatient PET scan   Admission Diagnosis  Thyroid nodule [E04.1] Lung mass [R91.8] Pulmonary embolism (HCC) [I26.99] Pulmonary emboli (HCC) [I26.99] Other acute pulmonary embolism with acute cor pulmonale (HCC) [I26.09]   Discharge Diagnosis  Thyroid nodule [E04.1] Lung mass [R91.8] Pulmonary embolism (HCC) [I26.99] Pulmonary emboli (HCC) [I26.99] Other acute pulmonary embolism with acute cor pulmonale (HCC) [I26.09]    Principal Problem:   Pulmonary embolism (HCC) Active Problems:   Acute respiratory  failure with hypoxia (HCC)   CKD (chronic kidney disease), stage IIIb   Type 2 diabetes mellitus with vascular disease ([place])   Morbid obesity ([place])   Essential hypertension   GERD      Past Medical History:  Diagnosis Date   Anemia    Arthritis    Chronic kidney disease    Diabetes mellitus type II    Dysphagia    unspecified    GERD (gastroesophageal reflux disease)    Hyperlipidemia 2000   Hypertension 1995   Insomnia    Low back pain    Obesity     Past Surgical History:  Procedure Laterality Date   Carpal tunnel release     left    CHOLECYSTECTOMY  2007   DIGIT NAIL REMOVAL  08/2011   KNEE SURGERY  1.20.2012   arthroscopy LEFT knee partial medial meniscectomy   TENOTOMY     2,3,4  left foot    toenail removal     TUBAL LIGATION         HPI  from the history and physical done on the day of admission:     Kathleen [name]  is a 69 y.o. female  With PMHx relevant for CKD IIIb,  HTN, DM, HLD, GERD, obesity, OA presents to the ED with worsening shortness of breath, worsening DOE  and was found to be hypoxic in the ED --Patient apparently was having DOE and shob as well as leg pains over last couple of week, took a 4 hr trip to [employer] arriving at [employer] she had a hard time walking from the elevator to her hotel room--- shob  and DOE continue to worsen-- --today in the ED she is found to have hypoxia with elevated D-dimer and tachycardia and CTA chest consistent with large right-sided PE and possible small left upper lobe PE -No frank chest pains no palpitations no dyspnea, specifically denies pleuritic type symptoms -Additional history obtained from her sister bedside -Patient's younger sister who is 2 years younger recently diagnosed with stage IV pancreatic cancer   CTA Chest  IMPRESSION: 1). Large right-sided pulmonary emboli are noted with CT evidence of right heart strain (RV/LV Ratio = 1.0) consistent with at least submassive  (intermediate risk) PE. The presence of right heart strain has been associated with an increased risk of morbidity and mortality.  2) 2.6 cm left thyroid nodule is noted. Recommend thyroid US.  3) 2.7 x 2.1 cm rounded well-defined low density is noted in the left lower lobe with average Hounsfield measurement of 40. It is uncertain if this represents possible complex pericardial cyst or mass. PET scan is recommended for further evaluation.  --Troponin is 47, repeat is 42 BNP is 159 hgb 11.6 Creatinine is 1.86 with recent baseline usually around 1.5-1.6 -D-dimer 19.18     Hospital Course:    Brief Summary:- 69 y.o.femaleWith PMHx relevant for CKD IIIb, HTN, DM, HLD, GERD, obesity, OApresents to the ED with worsening shortness of breath, worsening DOE and hypoxia subsequently admitted on 10/22/2019 with acute PE   A/p  1)Acute Bil PE----large right-sided submassive PE  and small left upper lobe PE---Echo with preserved EF of 65 to 70% with no significant right heart strain on echo -LEvenous Dopplers without DVT -Patient may benefit from abdominal pelvic imaging -Patient's SHOB and DOE started prior to trip to the beach,symptoms worsened immediately after she arrived at the beach hotel could even walk up from the elevator to her hotel room --This PE may have been present prior to patient's for a trip to the beach symptoms only worsened on that particular day rather than started that day -Patient will need malignancy work-up as part of work-up of a hypercoagulable state -Last colonoscopy more than 10 years ago apparently,last mammogram 2021 -CTAchest also showed 2.7 x 2.1 cm rounded well-defined low density is noted in the left lower lobe--patient will need outpatient PET scan Initially treated with IV heparin drip  transitioned to [place] on 10/25/19 --Hypoxia persist , unable to wean off oxygen at this time -continue supplemental oxygen  2)CKD IIIb--Creatinine is down to  1.7 from 1.86 on admission --recent baseline usually around 1.5-1.6 -patient had contrast/CTA study continue to hold Aldactone/hctz, hold ramipril --renally adjust medications, avoid nephrotoxic agents / dehydration / hypotension  - 3)DM2- A1c 8.3 ,reflecting uncontrolled diabetes PTAhad hypoglycemic episode in the ED, -PTA was on Lantus 60 mg twice daily, changed to 40 mg daily for now along with sliding scale coverage  4)HTN-continue amlodipine and clonidine ---hold ramipril and Aldactone/HCTZ due to kidney concerns  5)HLD--stable, continue pravastatin and aspirin  6)obesity and osteoarthritis----outpatient management as advised  7)acute hypoxic respiratory failure secondary to pulmonary embolism ---secondary to #1 above, manage as above #1 --Hypoxia persist ,  unable to wean off oxygen at this time  8)-De-Saturations when she falls asleep suggesting possible OSA=--outpatient sleep study advised  9)Thyroid Nodule---- 2.6 cm left inferior TR 4 nodule meets criteria for biopsy as above. This correlates with the CT finding. -Patient will need outpatient thyroid biopsy  10) chronic normocytic normochromic anemia--baseline hemoglobin usually between 10 and 11--- etiology unclear -Monitor closely with anticoagulation use  Dispo: The patient is from:Home Anticipated d/c is to:  SNF    Code Status : full  Family Communication:  (patient is alert, awake and coherent) --discussed with patient's sister Nannie  Consults  :  PCCM  DVT Prophylaxis  : Eliquis  Discharge Condition: stable  Follow UP   Follow-up Information    [place]. Schedule an appointment as soon as possible for a visit in 1 month(s).   Specialty: Radiology Why: 2.6 cm left inferior TR 4 nodule meets criteria for biopsy as above. This correlates with the CT finding. -Patient will need outpatient thyroid biopsy  Contact  information: [address] [phone] [place]       Chesley Mires, MD. Schedule an appointment as soon as possible for a visit in 3 week(s).   Specialty: Pulmonary Disease Why: -De-Saturations when she falls asleep suggesting possible OSA=--outpatient sleep study advised  Contact information: [address] [place] [address] [phone]        [facility] Follow up.   Why:   Home Oxygen              Diet and Activity recommendation:  As advised  Discharge Instructions    * Discharge Instructions    Call MD for:  difficulty breathing, headache or visual disturbances   Complete by: As directed    Call MD for:  persistant dizziness or light-headedness   Complete by: As directed    Call MD for:  persistant nausea and vomiting   Complete by: As directed    Call MD for:  severe uncontrolled pain   Complete by: As directed    Call MD for:  temperature >100.4   Complete by: As directed    Diet - low sodium heart healthy   Complete by: As directed    Diet Carb Modified   Complete by: As directed    Discharge instructions   Complete by: As directed    1)Avoid ibuprofen/Advil/Aleve/Motrin/Goody Powders/Naproxen/BC powders/Meloxicam/Diclofenac/Indomethacin and other Nonsteroidal anti-inflammatory medications as these will make you more likely to bleed and can cause stomach ulcers, can also cause Kidney problems.   2)insulin Lispro (HumaLOG) injection 0-10 Units 0-10 Units Subcutaneous, 3 times daily with meals CBG < 70: Implement Hypoglycemia Standing Orders and refer to Hypoglycemia Standing Orders sidebar report  CBG 70 - 120: 0 unit CBG 121 - 150: 0 unit  CBG 151 - 200: 1 unit CBG 201 - 250: 2 units CBG 251 - 300: 4 units CBG 301 - 350: 6 units  CBG 351 - 400: 8 units  CBG > 400: 10 units  3)Continue oxygen at 2L/min continously  4)CBC and BMP every Wednesday 10/31/19  5)outpatient thyroid  biopsy--- due to thyroid nodule--this can be done over the next 2 to 3 months  6) outpatient sleep study due to concerns about possible obstructive sleep apnea--you may need CPAP machine  7)2.7 x 2.1 cm rounded well-defined low density is noted in the left lower lobe--patient will need outpatient PET scan   Increase activity slowly  Complete by: As directed         Discharge Medications     Allergies as of 10/28/2019   No Known Allergies     Medication List    STOP taking these medications   Aspirin Low Dose 81 MG tablet Generic drug: aspirin   glipiZIDE 5 MG 24 hr tablet Commonly known as: GLUCOTROL XL   ramipril 10 MG capsule Commonly known as: ALTACE   spironolactone-hydrochlorothiazide 25-25 MG tablet Commonly known as: ALDACTAZIDE   Tylenol Arthritis Pain 650 MG CR tablet Generic drug: acetaminophen Replaced by: acetaminophen 325 MG tablet     TAKE these medications   acetaminophen 325 MG tablet Commonly known as: TYLENOL Take 2 tablets (650 mg total) by mouth every 6 (six) hours as needed for mild pain, fever or headache (or Fever >/= 101). Replaces: Tylenol Arthritis Pain 650 MG CR tablet   albuterol (2.5 MG/3ML) 0.083% nebulizer solution Commonly known as: PROVENTIL Take 3 mLs (2.5 mg total) by nebulization every 2 (two) hours as needed for wheezing.   amLODipine 10 MG tablet Commonly known as: NORVASC TAKE ONE TABLET BY MOUTH ONCE DAILY FOR BLOOD PRESSURE. What changed: See the new instructions.   cholecalciferol 1000 units tablet Commonly known as: VITAMIN D Take 1,000 Units by mouth daily.   cloNIDine 0.3 MG tablet Commonly known as: CATAPRES TAKE (1) TABLET BY MOUTH 3 TIMES DAILY. What changed: See the new instructions.   Eliquis DVT/PE Starter Pack Generic drug: Apixaban Starter Pack (10mg  and 5mg ) Take as directed on package: start with two-5mg  tablets twice daily for 7 days. On day 8, switch to one-5mg  tablet twice daily.   fluticasone  50 MCG/ACT nasal spray Commonly known as: FLONASE Place 2 sprays into both nostrils daily as needed. What changed: reasons to take this   gabapentin 100 MG capsule Commonly known as: NEURONTIN Take 1 capsule (100 mg total) by mouth 2 (two) times daily.   HumaLOG KwikPen 200 UNIT/ML KwikPen Generic drug: insulin lispro Inject 0-10 Units into the skin 4 (four) times daily -  before meals and at bedtime. insulin Lispro (HumaLOG) injection 0-10 Units 0-10 Units Subcutaneous, 3 times daily with meals CBG < 70: Implement Hypoglycemia Standing Orders and refer to Hypoglycemia Standing Orders sidebar report  CBG 70 - 120: 0 unit CBG 121 - 150: 0 unit  CBG 151 - 200: 1 unit CBG 201 - 250: 2 units CBG 251 - 300: 4 units CBG 301 - 350: 6 units  CBG 351 - 400: 8 units  CBG > 400: 10 units   HYDROcodone-acetaminophen 5-325 MG tablet Commonly known as: NORCO/VICODIN Take 1 tablet by mouth every 6 (six) hours as needed for moderate pain or severe pain.   Lantus SoloStar 100 UNIT/ML Solostar Pen Generic drug: insulin glargine Inject 40 Units into the skin at bedtime. What changed: See the new instructions.   pantoprazole 40 MG tablet Commonly known as: PROTONIX Take 1 tablet (40 mg total) by mouth daily. Start taking on: October 29, 2019   polyethylene glycol 17 g packet Commonly known as: MIRALAX / GLYCOLAX Take 17 g by mouth daily. Start taking on: October 29, 2019   pravastatin 40 MG tablet Commonly known as: PRAVACHOL Take 1 tablet (40 mg total) by mouth daily.   senna-docusate 8.6-50 MG tablet Commonly known as: Senokot-S Take 2 tablets by mouth 2 (two) times daily.   traZODone 50 MG tablet Commonly known as: DESYREL Take 1 tablet (50 mg total) by mouth at  bedtime as needed for sleep.            Durable Medical Equipment  (From admission, onward)         Start     Ordered   10/24/19 1516  For home use only DME oxygen  Once       Comments: SATURATION QUALIFICATIONS: (Thisnote  is usedto comply with regulatory documentation for home oxygen)  Patient Saturations on Room Air at Rest =88%  Patient Saturations on Room Air while Ambulating =84 %  Patient Saturations on2Liters of oxygen while Ambulating = 93%     Patient needs continuous O2 at 2 L/min continuously via nasal cannula with humidifier, with gaseous portability and conserving device  Question Answer Comment  Length of Need Lifetime   Liters per Minute 2   Frequency Continuous (stationary and portable oxygen unit needed)   Oxygen conserving device Yes   Oxygen delivery system Gas      10/24/19 1516         Major procedures and Radiology Reports - PLEASE review detailed and final reports for all details, in brief -   CT Angio Chest PE W and/or Wo Contrast  Result Date: 10/22/2019 CLINICAL DATA:  Shortness of breath. EXAM: CT ANGIOGRAPHY CHEST WITH CONTRAST TECHNIQUE: Multidetector CT imaging of the chest was performed using the standard protocol during bolus administration of intravenous contrast. Multiplanar CT image reconstructions and MIPs were obtained to evaluate the vascular anatomy. CONTRAST:  158mL OMNIPAQUE IOHEXOL 350 MG/ML SOLN COMPARISON:  None. FINDINGS: Cardiovascular: Large right-sided pulmonary emboli are noted. RV/LV ratio of 1.0 is noted suggesting right heart strain. Smaller filling defect or embolus is seen in left upper lobe branch. Mild cardiomegaly is noted. No pericardial effusion is noted. Mediastinum/Nodes: No adenopathy is noted. Esophagus is unremarkable. 2.6 cm left thyroid nodule is noted. Lungs/Pleura: No pneumothorax or pleural effusion is noted. Minimal left posterior basilar subsegmental atelectasis is noted. 2.7 x 2.1 cm rounded well-defined low density is noted in the left lower lobe with average Hounsfield measurement of 40. It is uncertain if this represents possible complex pericardial cyst or mass. Upper Abdomen: No acute abnormality. Musculoskeletal: No  chest wall abnormality. No acute or significant osseous findings. Review of the MIP images confirms the above findings. IMPRESSION: 1. Large right-sided pulmonary emboli are noted with CT evidence of right heart strain (RV/LV Ratio = 1.0) consistent with at least submassive (intermediate risk) PE. The presence of right heart strain has been associated with an increased risk of morbidity and mortality. Critical Value/emergent results were called by telephone at the time of interpretation on 10/22/2019 at 1:44 pm to provider Dr. Kathrynn Humble, Who verbally acknowledged these results. 2. 2.6 cm left thyroid nodule is noted. Recommend thyroid US. (Ref: J Am Coll Radiol. 2015 Feb;12(2): 143-50). 3. 2.7 x 2.1 cm rounded well-defined low density is noted in the left lower lobe with average Hounsfield measurement of 40. It is uncertain if this represents possible complex pericardial cyst or mass. PET scan is recommended for further evaluation. Electronically Signed   By: Marijo Conception M.D.   On: 10/22/2019 13:45   US Venous Img Lower Bilateral (DVT)  Result Date: 10/22/2019 CLINICAL DATA:  Pulmonary embolism EXAM: BILATERAL LOWER EXTREMITY VENOUS DOPPLER ULTRASOUND TECHNIQUE: Gray-scale sonography with graded compression, as well as color Doppler and duplex ultrasound were performed to evaluate the lower extremity deep venous systems from the level of the common femoral vein and including the common femoral, femoral, profunda femoral, popliteal  and calf veins including the posterior tibial, peroneal and gastrocnemius veins when visible. The superficial great saphenous vein was also interrogated. Spectral Doppler was utilized to evaluate flow at rest and with distal augmentation maneuvers in the common femoral, femoral and popliteal veins. COMPARISON:  None. FINDINGS: RIGHT LOWER EXTREMITY Common Femoral Vein: No evidence of thrombus. Normal compressibility, respiratory phasicity and response to augmentation. Saphenofemoral  Junction: No evidence of thrombus. Normal compressibility and flow on color Doppler imaging. Profunda Femoral Vein: No evidence of thrombus. Normal compressibility and flow on color Doppler imaging. Femoral Vein: No evidence of thrombus. Normal compressibility, respiratory phasicity and response to augmentation. Popliteal Vein: No evidence of thrombus. Normal compressibility, respiratory phasicity and response to augmentation. Calf Veins: No evidence of thrombus. Normal compressibility and flow on color Doppler imaging. Superficial Great Saphenous Vein: No evidence of thrombus. Normal compressibility. Venous Reflux:  None. Other Findings:  None. LEFT LOWER EXTREMITY Common Femoral Vein: No evidence of thrombus. Normal compressibility, respiratory phasicity and response to augmentation. Saphenofemoral Junction: No evidence of thrombus. Normal compressibility and flow on color Doppler imaging. Profunda Femoral Vein: No evidence of thrombus. Normal compressibility and flow on color Doppler imaging. Femoral Vein: No evidence of thrombus. Normal compressibility, respiratory phasicity and response to augmentation. Popliteal Vein: No evidence of thrombus. Normal compressibility, respiratory phasicity and response to augmentation. Calf Veins: No evidence of thrombus. Normal compressibility and flow on color Doppler imaging. Superficial Great Saphenous Vein: No evidence of thrombus. Normal compressibility. Venous Reflux:  None. Other Findings:  None. IMPRESSION: No evidence of deep venous thrombosis in either lower extremity. Electronically Signed   By: Franchot Gallo M.D.   On: 10/22/2019 15:53   DG Chest Port 1 View  Result Date: 10/22/2019 CLINICAL DATA:  Shortness of breath for 5 days. EXAM: PORTABLE CHEST 1 VIEW COMPARISON:  Radiographs 05/26/2011 and 03/15/2008. FINDINGS: 1042 hours. The heart size is stable at the upper limits of normal for portable AP technique. The mediastinal contours are normal. The pulmonary  vascularity is normal, and the lungs remain clear. There is no pleural effusion or pneumothorax. No acute osseous findings are seen. There are degenerative changes throughout the spine and at both shoulders. Telemetry leads overlie the chest. IMPRESSION: No active cardiopulmonary process. Electronically Signed   By: Richardean Sale M.D.   On: 10/22/2019 10:50   ECHOCARDIOGRAM COMPLETE  Result Date: 10/22/2019    ECHOCARDIOGRAM REPORT   Patient Name:   JULLIAN PREVITI [facility] Date of Exam: 10/22/2019 Medical Rec #:  [id number]       Height:       66.0 in Accession #:    [id number]      Weight:       230.0 lb Date of Birth:  02/21/1951      BSA:          2.122 m Patient Age:    12 years        BP:           150/84 mmHg Patient Gender: F               HR:           101 bpm. Exam Location:  [facility] Procedure: 2D Echo, Cardiac Doppler and Color Doppler Indications:    Pulmonary Embolus 415.19 / I26.99  History:        Patient has no prior history of Echocardiogram examinations.  Risk Factors:Diabetes, Dyslipidemia and Hypertension. Acute                 respiratory failure with hypoxia,CKD (chronic kidney disease)                 stage 4,Morbid obesity,Pulmonary embolism.  Sonographer:    Alvino Chapel RCS Referring Phys: [phone] Salvadore Valvano IMPRESSIONS  1. Left ventricular ejection fraction, by estimation, is 65 to 70%. The left ventricle has normal function. The left ventricle has no regional wall motion abnormalities. There is mild left ventricular hypertrophy.  2. Right ventricular systolic function is mildly reduced. The right ventricular size is mildly enlarged.  3. Right atrial size was mildly dilated.  4. The mitral valve is normal in structure. No evidence of mitral valve regurgitation.  5. The aortic valve is tricuspid. Aortic valve regurgitation is not visualized. Mild aortic valve sclerosis is present, with no evidence of aortic valve stenosis.  6. The inferior vena cava is normal in size with  greater than 50% respiratory variability, suggesting right atrial pressure of 3 mmHg. FINDINGS  Left Ventricle: Left ventricular ejection fraction, by estimation, is 65 to 70%. The left ventricle has normal function. The left ventricle has no regional wall motion abnormalities. The left ventricular internal cavity size was normal in size. There is  mild left ventricular hypertrophy. Left ventricular diastolic function could not be evaluated. Right Ventricle: The right ventricular size is mildly enlarged. Right vetricular wall thickness was not assessed. Right ventricular systolic function is mildly reduced. Left Atrium: Left atrial size was normal in size. Right Atrium: Right atrial size was mildly dilated. Pericardium: There is no evidence of pericardial effusion. Mitral Valve: The mitral valve is normal in structure. No evidence of mitral valve regurgitation. Tricuspid Valve: The tricuspid valve is normal in structure. Tricuspid valve regurgitation is trivial. Aortic Valve: The aortic valve is tricuspid. Aortic valve regurgitation is not visualized. Mild aortic valve sclerosis is present, with no evidence of aortic valve stenosis. Pulmonic Valve: The pulmonic valve was grossly normal. Pulmonic valve regurgitation is not visualized. Aorta: The aortic root is normal in size and structure. Venous: The inferior vena cava is normal in size with greater than 50% respiratory variability, suggesting right atrial pressure of 3 mmHg. IAS/Shunts: No atrial level shunt detected by color flow Doppler.  LEFT VENTRICLE PLAX 2D LVIDd:         3.43 cm  Diastology LVIDs:         2.14 cm  LV e' lateral:   7.62 cm/s LV PW:         1.15 cm  LV E/e' lateral: 10.2 LV IVS:        1.31 cm  LV e' medial:    6.74 cm/s LVOT diam:     1.90 cm  LV E/e' medial:  11.6 LV SV:         48 LV SV Index:   23 LVOT Area:     2.84 cm  RIGHT VENTRICLE RV S prime:     13.20 cm/s TAPSE (M-mode): 1.9 cm LEFT ATRIUM             Index       RIGHT ATRIUM            Index LA diam:        2.60 cm 1.23 cm/m  RA Area:     13.90 cm LA Vol (A2C):   30.0 ml 14.14 ml/m RA Volume:   29.80 ml  14.04 ml/m LA Vol (A4C):   29.8 ml 14.04 ml/m LA Biplane Vol: 29.8 ml 14.04 ml/m  AORTIC VALVE LVOT Vmax:   128.00 cm/s LVOT Vmean:  78.900 cm/s LVOT VTI:    0.171 m  AORTA Ao Root diam: 3.10 cm MITRAL VALVE MV Area (PHT): 2.54 cm     SHUNTS MV Decel Time: 299 msec     Systemic VTI:  0.17 m MV E velocity: 78.10 cm/s   Systemic Diam: 1.90 cm MV A velocity: 108.00 cm/s MV E/A ratio:  0.72 Dorris Carnes MD Electronically signed by Dorris Carnes MD Signature Date/Time: 10/22/2019/4:36:41 PM    Final    US THYROID  Result Date: 10/23/2019 CLINICAL DATA:  Thyroid nodule by CT EXAM: THYROID ULTRASOUND TECHNIQUE: Ultrasound examination of the thyroid gland and adjacent soft tissues was performed. COMPARISON:  10/22/2019 FINDINGS: Parenchymal Echotexture: Moderately heterogenous Isthmus: 4 mm Right lobe: 5.3 x 2.9 x 1.7 cm Left lobe: 5.6 x 3.4 x 2.7 cm _________________________________________________________ Estimated total number of nodules >/= 1 cm: 1 Number of spongiform nodules >/=  2 cm not described below (TR1): 0 Number of mixed cystic and solid nodules >/= 1.5 cm not described below (TR2): 0 _________________________________________________________ Nodule # 1: Location: Left; Inferior Maximum size: 2.6 cm; Other 2 dimensions: 2.2 x 2.0 cm Composition: solid/almost completely solid (2) Echogenicity: hypoechoic (2) Shape: not taller-than-wide (0) Margins: ill-defined (0) Echogenic foci: none (0) ACR TI-RADS total points: 4. ACR TI-RADS risk category: TR4 (4-6 points). ACR TI-RADS recommendations: **Given size (>/= 1.5 cm) and appearance, fine needle aspiration of this moderately suspicious nodule should be considered based on TI-RADS criteria. _________________________________________________________ Nonspecific moderate thyroid heterogeneity. Limited exam because of body habitus. No  significant right thyroid abnormality. No hypervascularity or regional adenopathy. IMPRESSION: 2.6 cm left inferior TR 4 nodule meets criteria for biopsy as above. This correlates with the CT finding. The above is in keeping with the ACR TI-RADS recommendations - J Am Coll Radiol 2017;14:587-595. Electronically Signed   By: Jerilynn Mages.  Shick M.D.   On: 10/23/2019 09:37   Micro Results   Recent Results (from the past 240 hour(s))  SARS Coronavirus 2 by RT PCR (hospital order, performed in [facility] hospital lab) Nasopharyngeal Nasopharyngeal Swab     Status: None   Collection Time: 10/22/19 11:17 AM   Specimen: Nasopharyngeal Swab  Result Value Ref Range Status   SARS Coronavirus 2 NEGATIVE NEGATIVE Final    Comment: (NOTE) SARS-CoV-2 target nucleic acids are NOT DETECTED.  The SARS-CoV-2 RNA is generally detectable in upper and lower respiratory specimens during the acute phase of infection. The lowest concentration of SARS-CoV-2 viral copies this assay can detect is 250 copies / mL. A negative result does not preclude SARS-CoV-2 infection and should not be used as the sole basis for treatment or other patient management decisions.  A negative result may occur with improper specimen collection / handling, submission of specimen other than nasopharyngeal swab, presence of viral mutation(s) within the areas targeted by this assay, and inadequate number of viral copies (<250 copies / mL). A negative result must be combined with clinical observations, patient history, and epidemiological information.  Fact Sheet for Patients:   StrictlyIdeas.no  Fact Sheet for Healthcare Providers: BankingDealers.co.za  This test is not yet approved or  cleared by the [place] FDA and has been authorized for detection and/or diagnosis of SARS-CoV-2 by FDA under an Emergency Use Authorization (EUA).  This EUA will remain in effect (meaning this test can  be  used) for the duration of the COVID-19 declaration under Section 564(b)(1) of the Act, 21 U.S.C. section 360bbb-3(b)(1), unless the authorization is terminated or revoked sooner.  Performed at [facility], [address], [place], [place] [address]   MRSA PCR Screening     Status: None   Collection Time: 10/23/19  2:49 PM   Specimen: Nasal Mucosa; Nasopharyngeal  Result Value Ref Range Status   MRSA by PCR NEGATIVE NEGATIVE Final    Comment:        The GeneXpert MRSA Assay (FDA approved for NASAL specimens only), is one component of a comprehensive MRSA colonization surveillance program. It is not intended to diagnose MRSA infection nor to guide or monitor treatment for MRSA infections. Performed at [facility], [address] [place], [place] [address]    Today   Subjective    [facility] today has no new complaints No fever  Or chills   No Nausea, Vomiting or Diarrhea       Patient has been seen and examined prior to discharge   Objective   Blood pressure (!) 148/68, pulse 92, temperature 98.6 F (37 C), temperature source Oral, resp. rate 17, height 5\' 6"  (1.676 m), weight 100.7 kg, SpO2 98 %.   Intake/Output Summary (Last 24 hours) at 10/28/2019 1113 Last data filed at 10/28/2019 0500 Gross per 24 hour  Intake 480 ml  Output --  Net 480 ml   Exam Gen:- Awake Alert, no acute distress  HEENT:- [place].AT, No sclera icterus Neck-Supple Neck,No JVD,.  Lungs-  CTAB , good air movement bilaterally  CV- S1, S2 normal, regular Abd-  +ve B.Sounds, Abd Soft, No tenderness,    Extremity/Skin:- No  edema,   good pulses Psych-affect is appropriate, oriented x3 Neuro-generalized weakness, no new focal deficits, no tremors    Data Review   CBC w Diff:  Lab Results  Component Value Date   WBC 8.2 10/28/2019   HGB 10.1 (L) 10/28/2019   HCT 32.6 (L) 10/28/2019   PLT 258 10/28/2019   LYMPHOPCT 23 10/22/2019   MONOPCT 8 10/22/2019   EOSPCT 2 10/22/2019   BASOPCT 0  10/22/2019    CMP:  Lab Results  Component Value Date   NA 130 (L) 10/27/2019   K 5.1 10/27/2019   CL 99 10/27/2019   CO2 26 10/27/2019   BUN 28 (H) 10/27/2019   CREATININE 1.75 (H) 10/27/2019   CREATININE 1.62 (H) 05/24/2019   PROT 7.7 05/24/2019   ALBUMIN 3.4 (L) 10/24/2019   BILITOT 0.4 05/24/2019   ALKPHOS 94 01/06/2017   AST 13 05/24/2019   ALT 13 05/24/2019  . Total Discharge time is about 33 minutes  Roxan Hockey M.D on 10/28/2019 at 11:13 AM  Go to [email or web address] -  for contact info  [facility] - Office  [phone]

## 2019-10-29 DIAGNOSIS — M1990 Unspecified osteoarthritis, unspecified site: Secondary | ICD-10-CM | POA: Diagnosis not present

## 2019-10-29 DIAGNOSIS — E1169 Type 2 diabetes mellitus with other specified complication: Secondary | ICD-10-CM | POA: Diagnosis not present

## 2019-10-29 DIAGNOSIS — N183 Chronic kidney disease, stage 3 unspecified: Secondary | ICD-10-CM | POA: Diagnosis not present

## 2019-10-29 DIAGNOSIS — D649 Anemia, unspecified: Secondary | ICD-10-CM | POA: Diagnosis not present

## 2019-10-29 DIAGNOSIS — K219 Gastro-esophageal reflux disease without esophagitis: Secondary | ICD-10-CM | POA: Diagnosis not present

## 2019-11-13 ENCOUNTER — Telehealth: Payer: Self-pay

## 2019-11-13 NOTE — Telephone Encounter (Signed)
Transition Care Management Follow-up Telephone Call   Date discharged?  11/09/19 discharged from rehab              How have you been since you were released from the hospital? good but feet swelling and walking with cane   Do you understand why you were in the hospital? Clot in lung   Do you understand the discharge instructions? yes   Where were you discharged to? rehab   Items Reviewed:  Medications reviewed: yes  Allergies reviewed: yes  Dietary changes reviewed: yes  Referrals reviewed: no new referrals   Functional Questionnaire:   Activities of Daily Living (ADLs):  yes and has help if needs it    Any transportation issues/concerns?: no   Any patient concerns? no   Confirmed importance and date/time of follow-up visits scheduled has her appt     Confirmed with patient if condition begins to worsen call PCP or go to the ER.  Patient was given the office number and encouraged to call back with question or concerns.  yes with verbal understanding.

## 2019-11-17 DIAGNOSIS — D631 Anemia in chronic kidney disease: Secondary | ICD-10-CM | POA: Diagnosis not present

## 2019-11-17 DIAGNOSIS — I129 Hypertensive chronic kidney disease with stage 1 through stage 4 chronic kidney disease, or unspecified chronic kidney disease: Secondary | ICD-10-CM | POA: Diagnosis not present

## 2019-11-17 DIAGNOSIS — Z7901 Long term (current) use of anticoagulants: Secondary | ICD-10-CM | POA: Diagnosis not present

## 2019-11-17 DIAGNOSIS — E785 Hyperlipidemia, unspecified: Secondary | ICD-10-CM | POA: Diagnosis not present

## 2019-11-17 DIAGNOSIS — M129 Arthropathy, unspecified: Secondary | ICD-10-CM | POA: Diagnosis not present

## 2019-11-17 DIAGNOSIS — G4700 Insomnia, unspecified: Secondary | ICD-10-CM | POA: Diagnosis not present

## 2019-11-17 DIAGNOSIS — E1159 Type 2 diabetes mellitus with other circulatory complications: Secondary | ICD-10-CM | POA: Diagnosis not present

## 2019-11-17 DIAGNOSIS — N1832 Chronic kidney disease, stage 3b: Secondary | ICD-10-CM | POA: Diagnosis not present

## 2019-11-17 DIAGNOSIS — I2699 Other pulmonary embolism without acute cor pulmonale: Secondary | ICD-10-CM | POA: Diagnosis not present

## 2019-11-17 DIAGNOSIS — M545 Low back pain: Secondary | ICD-10-CM | POA: Diagnosis not present

## 2019-11-17 DIAGNOSIS — E041 Nontoxic single thyroid nodule: Secondary | ICD-10-CM | POA: Diagnosis not present

## 2019-11-17 DIAGNOSIS — R1310 Dysphagia, unspecified: Secondary | ICD-10-CM | POA: Diagnosis not present

## 2019-11-17 DIAGNOSIS — Z794 Long term (current) use of insulin: Secondary | ICD-10-CM | POA: Diagnosis not present

## 2019-11-17 DIAGNOSIS — K219 Gastro-esophageal reflux disease without esophagitis: Secondary | ICD-10-CM | POA: Diagnosis not present

## 2019-11-17 DIAGNOSIS — Z9981 Dependence on supplemental oxygen: Secondary | ICD-10-CM | POA: Diagnosis not present

## 2019-11-17 DIAGNOSIS — E1122 Type 2 diabetes mellitus with diabetic chronic kidney disease: Secondary | ICD-10-CM | POA: Diagnosis not present

## 2019-11-17 DIAGNOSIS — R918 Other nonspecific abnormal finding of lung field: Secondary | ICD-10-CM | POA: Diagnosis not present

## 2019-11-20 ENCOUNTER — Inpatient Hospital Stay: Payer: Medicare Other | Admitting: Primary Care

## 2019-11-22 ENCOUNTER — Other Ambulatory Visit: Payer: Self-pay

## 2019-11-22 ENCOUNTER — Ambulatory Visit (INDEPENDENT_AMBULATORY_CARE_PROVIDER_SITE_OTHER): Payer: Medicare Other | Admitting: Family Medicine

## 2019-11-22 ENCOUNTER — Encounter: Payer: Self-pay | Admitting: Family Medicine

## 2019-11-22 VITALS — BP 154/80 | HR 108 | Resp 17 | Ht 66.0 in | Wt 227.0 lb

## 2019-11-22 DIAGNOSIS — E1159 Type 2 diabetes mellitus with other circulatory complications: Secondary | ICD-10-CM

## 2019-11-22 DIAGNOSIS — G4730 Sleep apnea, unspecified: Secondary | ICD-10-CM | POA: Diagnosis not present

## 2019-11-22 DIAGNOSIS — R9389 Abnormal findings on diagnostic imaging of other specified body structures: Secondary | ICD-10-CM

## 2019-11-22 DIAGNOSIS — Z9981 Dependence on supplemental oxygen: Secondary | ICD-10-CM | POA: Diagnosis not present

## 2019-11-22 DIAGNOSIS — I10 Essential (primary) hypertension: Secondary | ICD-10-CM

## 2019-11-22 DIAGNOSIS — E041 Nontoxic single thyroid nodule: Secondary | ICD-10-CM

## 2019-11-22 DIAGNOSIS — N1832 Chronic kidney disease, stage 3b: Secondary | ICD-10-CM

## 2019-11-22 DIAGNOSIS — Z09 Encounter for follow-up examination after completed treatment for conditions other than malignant neoplasm: Secondary | ICD-10-CM

## 2019-11-22 MED ORDER — LISINOPRIL 10 MG PO TABS: 10.0000 mg | ORAL_TABLET | Freq: Every day | ORAL | 3 refills | Status: DC

## 2019-11-22 NOTE — Patient Instructions (Signed)
F/u in office with md in 6 to 8 weeks, call if you need me sooner  You need a sleep study, lung specialist can arrange this , I will request  New additional medication for  Blood pressure is lisinopril one daily, it is high  You will be referred to eNT to biopsy thyroid nodule in the next approximately 3 month  HANDICAP sticker today from office  Be careful not to fall  Bedtime/ sleep oxygen level will be checked, we will arrange test  Thanks for choosing [facility], we consider it a privelige to serve you.

## 2019-11-23 DIAGNOSIS — E1122 Type 2 diabetes mellitus with diabetic chronic kidney disease: Secondary | ICD-10-CM | POA: Diagnosis not present

## 2019-11-23 DIAGNOSIS — Z9981 Dependence on supplemental oxygen: Secondary | ICD-10-CM | POA: Diagnosis not present

## 2019-11-23 DIAGNOSIS — I129 Hypertensive chronic kidney disease with stage 1 through stage 4 chronic kidney disease, or unspecified chronic kidney disease: Secondary | ICD-10-CM | POA: Diagnosis not present

## 2019-11-23 DIAGNOSIS — Z7901 Long term (current) use of anticoagulants: Secondary | ICD-10-CM | POA: Diagnosis not present

## 2019-11-23 DIAGNOSIS — E041 Nontoxic single thyroid nodule: Secondary | ICD-10-CM | POA: Diagnosis not present

## 2019-11-23 DIAGNOSIS — D631 Anemia in chronic kidney disease: Secondary | ICD-10-CM | POA: Diagnosis not present

## 2019-11-23 DIAGNOSIS — R918 Other nonspecific abnormal finding of lung field: Secondary | ICD-10-CM | POA: Diagnosis not present

## 2019-11-23 DIAGNOSIS — I2699 Other pulmonary embolism without acute cor pulmonale: Secondary | ICD-10-CM | POA: Diagnosis not present

## 2019-11-23 DIAGNOSIS — R1310 Dysphagia, unspecified: Secondary | ICD-10-CM | POA: Diagnosis not present

## 2019-11-23 DIAGNOSIS — Z794 Long term (current) use of insulin: Secondary | ICD-10-CM | POA: Diagnosis not present

## 2019-11-23 DIAGNOSIS — M129 Arthropathy, unspecified: Secondary | ICD-10-CM | POA: Diagnosis not present

## 2019-11-23 DIAGNOSIS — G4700 Insomnia, unspecified: Secondary | ICD-10-CM | POA: Diagnosis not present

## 2019-11-23 DIAGNOSIS — K219 Gastro-esophageal reflux disease without esophagitis: Secondary | ICD-10-CM | POA: Diagnosis not present

## 2019-11-23 DIAGNOSIS — M545 Low back pain: Secondary | ICD-10-CM | POA: Diagnosis not present

## 2019-11-23 DIAGNOSIS — N1832 Chronic kidney disease, stage 3b: Secondary | ICD-10-CM | POA: Diagnosis not present

## 2019-11-23 DIAGNOSIS — E1159 Type 2 diabetes mellitus with other circulatory complications: Secondary | ICD-10-CM | POA: Diagnosis not present

## 2019-11-23 DIAGNOSIS — E785 Hyperlipidemia, unspecified: Secondary | ICD-10-CM | POA: Diagnosis not present

## 2019-11-25 ENCOUNTER — Encounter: Payer: Self-pay | Admitting: Family Medicine

## 2019-11-25 DIAGNOSIS — Z09 Encounter for follow-up examination after completed treatment for conditions other than malignant neoplasm: Secondary | ICD-10-CM | POA: Insufficient documentation

## 2019-11-25 DIAGNOSIS — G4730 Sleep apnea, unspecified: Secondary | ICD-10-CM

## 2019-11-25 DIAGNOSIS — E041 Nontoxic single thyroid nodule: Secondary | ICD-10-CM

## 2019-11-25 DIAGNOSIS — R9389 Abnormal findings on diagnostic imaging of other specified body structures: Secondary | ICD-10-CM | POA: Insufficient documentation

## 2019-11-25 HISTORY — DX: Sleep apnea, unspecified: G47.30

## 2019-11-25 HISTORY — DX: Nontoxic single thyroid nodule: E04.1

## 2019-11-25 NOTE — Assessment & Plan Note (Signed)
refer to Nephrology ofr follow up and assistance with management

## 2019-11-25 NOTE — Assessment & Plan Note (Addendum)
Elevated at visit,lisinopril added , re eval in 6 to  Weeks DASH diet and commitment to daily physical activity for a minimum of 30 minutes discussed and encouraged, as a part of hypertension management. The importance of attaining a healthy weight is also discussed.  BP/Weight 11/22/2019 10/28/2019 09/03/2019 05/21/2019 03/06/2019 07/15/9907 4/0/0050  Systolic BP 567 889 338 826 666 - 486  Diastolic BP 80 68 80 70 76 - 56  Wt. (Lbs) 227 222 233.4 232 232 232 233.12  BMI 36.64 35.83 37.67 37.45 37.45 37.45 37.63

## 2019-11-25 NOTE — Assessment & Plan Note (Signed)
Needs pulmonary eval and study for dx to be  made

## 2019-11-25 NOTE — Assessment & Plan Note (Signed)
LLL density , unclear if a pericardial cyst or lung mass, PET scan recommended , will ask Pulmonary to f/u on this

## 2019-11-25 NOTE — Assessment & Plan Note (Signed)
Kathleen [name] is reminded of the importance of commitment to daily physical activity for 30 minutes or more, as able and the need to limit carbohydrate intake to 30 to 60 grams per meal to help with blood sugar control.   The need to take medication as prescribed, test blood sugar as directed, and to call between visits if there is a concern that blood sugar is uncontrolled is also discussed.   Kathleen [name] is reminded of the importance of daily foot exam, annual eye examination, and good blood sugar, blood pressure and cholesterol control. Controlled, no change in medication   Diabetic Labs Latest Ref Rng & Units 10/27/2019 10/25/2019 10/24/2019 10/23/2019 10/22/2019  HbA1c 4.8 - 5.6 % - - - - 7.6(H)  Microalbumin mg/dL - - - - -  Micro/Creat Ratio 0.0 - 30.0 mg/g creat - - - - -  Chol <200 mg/dL - - - - -  HDL > OR = 50 mg/dL - - - - -  Calc LDL mg/dL (calc) - - - - -  Triglycerides <150 mg/dL - - - - -  Creatinine 0.44 - 1.00 mg/dL 1.75(H) 1.66(H) 1.67(H) 1.56(H) 1.86(H)   BP/Weight 11/22/2019 10/28/2019 09/03/2019 05/21/2019 03/06/2019 06/16/784 11/12/4490  Systolic BP 010 071 219 758 832 - 549  Diastolic BP 80 68 80 70 76 - 56  Wt. (Lbs) 227 222 233.4 232 232 232 233.12  BMI 36.64 35.83 37.67 37.45 37.45 37.45 37.63   Foot/eye exam completion dates Latest Ref Rng & Units 03/06/2019 06/22/2018  Eye Exam No Retinopathy - -  Foot Form Completion - Done Done

## 2019-11-25 NOTE — Assessment & Plan Note (Signed)
Left thyroid nodule > 1 cm , needs biopsy, refer ENT

## 2019-11-25 NOTE — Assessment & Plan Note (Signed)
Required supplemental oxygen at time of discharge from hospital , currnetly states no longer needs this will hold off on removing oxygen at this time, has upcoming pulmonary evaluation

## 2019-11-25 NOTE — Progress Notes (Signed)
Kathleen [name]     MRN: [id number]      DOB: 1950/07/08   HPI Kathleen [name] is here for follow up of recent hospitalization for acute PE, with cor pulmonale from 6/14 to 6/20/201, followed by admission to SNF for rehab, recently got home Currently anticoagulated denies epistaxis or rectal bleeding,and has appt with pulmonary to follow her POE as well as possible pulmonary mass also for sleep study She had required oxygen at time of discharge , does not feel she needs it  Now but has upcoming pulmonary appt with  several concerns as far as her lungs are concerned Needs biopsy of thyroid nodule also  ROS Denies recent fever or chills. Denies sinus pressure, nasal congestion, ear pain or sore throat.  Denies chest pains, palpitations and leg swelling Denies abdominal pain, nausea, vomiting,diarrhea or constipation.   Denies dysuria, frequency, hesitancy or incontinence. Chronic   joint pain, swelling and limitation in mobility. Denies headaches, seizures, numbness, or tingling. Denies depression, anxiety or insomnia. Denies skin break down or rash.   PE  BP (!) 154/80   Pulse (!) 108   Resp 17   Ht 5\' 6"  (1.676 m)   Wt 227 lb (103 kg)   SpO2 93%   BMI 36.64 kg/m   Patient alert and oriented and in no cardiopulmonary distress.  HEENT: No facial asymmetry, EOMI,     Neck supple .  Chest: Clear to auscultation bilaterally.  CVS: S1, S2 no murmurs, no S3.Regular rate.  ABD: Soft non tender.   Ext: No edema  MS: Adequate ROM spine, shoulders, hips and knees.  Skin: Intact, no ulcerations or rash noted.  Psych: Good eye contact, normal affect. Memory intact not anxious or depressed appearing.  CNS: CN 2-12 intact, power,  normal throughout.no focal deficits noted.   Kathleen [name] Hospital discharge follow-up Patient in for follow up of recent hospitalization. Discharge summary, and laboratory and radiology data are reviewed, and any questions or concerns about  recent hospitalization are discussed. Specific issues requiring follow up are specifically addressed.   Sleep apnea in adult Needs pulmonary eval and study for dx to be  made  Abnormal CT scan, chest LLL density , unclear if a pericardial cyst or lung mass, PET scan recommended , will ask Pulmonary to f/u on this   Supplemental oxygen dependent Required supplemental oxygen at time of discharge from hospital , currnetly states no longer needs this will hold off on removing oxygen at this time, has upcoming pulmonary evaluation  Essential hypertension Elevated at visit,lisinopril added , re eval in 6 to  Weeks DASH diet and commitment to daily physical activity for a minimum of 30 minutes discussed and encouraged, as a part of hypertension management. The importance of attaining a healthy weight is also discussed.  BP/Weight 11/22/2019 10/28/2019 09/03/2019 05/21/2019 03/06/2019 12/16/3808 05/16/5100  Systolic BP 585 277 824 235 361 - 443  Diastolic BP 80 68 80 70 76 - 56  Wt. (Lbs) 227 222 233.4 232 232 232 233.12  BMI 36.64 35.83 37.67 37.45 37.45 37.45 37.63       Type 2 diabetes mellitus with vascular disease ([place]) Ms. Mercer is reminded of the importance of commitment to daily physical activity for 30 minutes or more, as able and the need to limit carbohydrate intake to 30 to 60 grams per meal to help with blood sugar control.   The need to take medication as prescribed, test blood sugar as directed, and  to call between visits if there is a concern that blood sugar is uncontrolled is also discussed.   Ms. Spizzirri is reminded of the importance of daily foot exam, annual eye examination, and good blood sugar, blood pressure and cholesterol control. Controlled, no change in medication   Diabetic Labs Latest Ref Rng & Units 10/27/2019 10/25/2019 10/24/2019 10/23/2019 10/22/2019  HbA1c 4.8 - 5.6 % - - - - 7.6(H)  Microalbumin mg/dL - - - - -  Micro/Creat Ratio 0.0 - 30.0 mg/g creat - - - -  -  Chol <200 mg/dL - - - - -  HDL > OR = 50 mg/dL - - - - -  Calc LDL mg/dL (calc) - - - - -  Triglycerides <150 mg/dL - - - - -  Creatinine 0.44 - 1.00 mg/dL 1.75(H) 1.66(H) 1.67(H) 1.56(H) 1.86(H)   BP/Weight 11/22/2019 10/28/2019 09/03/2019 05/21/2019 03/06/2019 11/12/4490 0/05/69  Systolic BP 219 758 832 549 826 - 415  Diastolic BP 80 68 80 70 76 - 56  Wt. (Lbs) 227 222 233.4 232 232 232 233.12  BMI 36.64 35.83 37.67 37.45 37.45 37.45 37.63   Foot/eye exam completion dates Latest Ref Rng & Units 03/06/2019 06/22/2018  Eye Exam No Retinopathy - -  Foot Form Completion - Done Done        Morbid obesity (HCC) Obesity linked with HTN and diabetes  Patient re-educated about  the importance of commitment to a  minimum of 150 minutes of exercise per week as able.  The importance of healthy food choices with portion control discussed, as well as eating regularly and within a 12 hour window most days. The need to choose "clean , green" food 50 to 75% of the time is discussed, as well as to make water the primary drink and set a goal of 64 ounces water daily.    Weight /BMI 11/22/2019 10/28/2019 10/23/2019  WEIGHT 227 lb 222 lb 0.1 oz -  HEIGHT 5\' 6"  - 5\' 6"   BMI 36.64 kg/m2 - 35.83 kg/m2      Thyroid nodule Left thyroid nodule > 1 cm , needs biopsy, refer ENT  CKD (chronic kidney disease) stage 3, GFR 30-59 ml/min  refer to Nephrology ofr follow up and assistance with management

## 2019-11-25 NOTE — Assessment & Plan Note (Signed)
Patient in for follow up of recent hospitalization. Discharge summary, and laboratory and radiology data are reviewed, and any questions or concerns about recent hospitalization are discussed. Specific issues requiring follow up are specifically addressed.  

## 2019-11-25 NOTE — Assessment & Plan Note (Signed)
Obesity linked with HTN and diabetes  Patient re-educated about  the importance of commitment to a  minimum of 150 minutes of exercise per week as able.  The importance of healthy food choices with portion control discussed, as well as eating regularly and within a 12 hour window most days. The need to choose "clean , green" food 50 to 75% of the time is discussed, as well as to make water the primary drink and set a goal of 64 ounces water daily.    Weight /BMI 11/22/2019 10/28/2019 10/23/2019  WEIGHT 227 lb 222 lb 0.1 oz -  HEIGHT 5\' 6"  - 5\' 6"   BMI 36.64 kg/m2 - 35.83 kg/m2

## 2019-11-27 ENCOUNTER — Ambulatory Visit (INDEPENDENT_AMBULATORY_CARE_PROVIDER_SITE_OTHER): Payer: Medicare Other | Admitting: Internal Medicine

## 2019-11-27 ENCOUNTER — Other Ambulatory Visit: Payer: Self-pay

## 2019-11-27 ENCOUNTER — Encounter: Payer: Self-pay | Admitting: Internal Medicine

## 2019-11-27 DIAGNOSIS — I2699 Other pulmonary embolism without acute cor pulmonale: Secondary | ICD-10-CM

## 2019-11-27 DIAGNOSIS — I10 Essential (primary) hypertension: Secondary | ICD-10-CM

## 2019-11-27 DIAGNOSIS — R9389 Abnormal findings on diagnostic imaging of other specified body structures: Secondary | ICD-10-CM

## 2019-11-27 DIAGNOSIS — Z9981 Dependence on supplemental oxygen: Secondary | ICD-10-CM

## 2019-11-27 MED ORDER — APIXABAN 5 MG PO TABS
5.0000 mg | ORAL_TABLET | Freq: Two times a day (BID) | ORAL | 5 refills | Status: DC
Start: 2019-11-27 — End: 2020-06-03

## 2019-11-27 NOTE — Assessment & Plan Note (Signed)
Dx 10/22/19 p car trip to [place] and back in pt with  - CTa 10/22/19 large right-sided submassive PE and small left upper lobe PE - ECho 10/22/19 Right ventricular systolic function is mildly reduced. The right  ventricular size is mildly enlarged.  Right atrial size was mildly dilated.  -LEvenous Dopplers 10/22/19  without DVT  Back to baseline doe/ leg swelling persists but is mild and likely related to use of amlodipine  Will need f/u echo at 3 m on rx to r/o evolving TEPAH and decision at 6 m whether to continue high dose, intermediate dose of asa and welcome hematology input at that point as her risk factors = prior PE, MO and relatively sedentary secondary to DJD  >>> f/u by televisit in 3 m after studies available.

## 2019-11-27 NOTE — Assessment & Plan Note (Signed)
Adequate control on present rx, reviewed in detail with pt > no change in rx needed    However, the acei is probably contributing to sporadic dry cough and the amlopidine to the swelling > would favor trial of ARB/continue lasix but defer final rx/ f/u to Dr Griffin Dakin capable hands  Medical decision making was a high  level of complexity in this case because of  two chronic conditions /diagnoses requiring extra time for  H and P, chart review, counselin= , 42min post hosp ov  and generating customized AVS unique to this office visit and charting.   Each maintenance medication was reviewed in detail including emphasizing most importantly the difference between maintenance and prns and under what circumstances the prns are to be triggered using an action plan format where appropriate. Please see avs for details which were reviewed in writing by both me and my nurse and patient given a written copy highlighted where appropriate with yellow highlighter for the patient's continued care at home along with an updated version of their medications.  Patient was asked to maintain medication reconciliation by comparing this list to the actual medications being used at home and to contact this office right away if there is a conflict or discrepancy.

## 2019-11-27 NOTE — Assessment & Plan Note (Signed)
Never smoker - dt a 10/22/19 :  LLL 2.7 x 2.1 ? Cystic lesion vs mass in setting of Acute life threatening PE - placed reminder file for reCT 01/22/20   I though this might just be an infarct but probably some form of benign cyst, no likely ca and no urgency to attempt any kind of invasive w/u in setting of recent PE so continue DOAC and f/u with a 3 month CT s contrast / echo - if lesion persists but echo nl will consider PET in anticipation of bx / surgical intervention which should be postponed until then  Discussed in detail all the  indications, usual  risks and alternatives  relative to the benefits with patient who agrees to proceed with w/u as outlined.

## 2019-11-27 NOTE — Patient Instructions (Signed)
Stay as active as you can   We will contact you in early September to arrange your CT and Echo repeat and call you the results with recommendations for long term treatment (set up televist after studies)

## 2019-11-27 NOTE — Assessment & Plan Note (Addendum)
Complicated by DM/ hbp and now PE  Body mass index is 35.51 kg/m.  -   Lab Results  Component Value Date   TSH 1.110 10/22/2019     Contributing to gerd risk/ doe/reviewed the need and the process to achieve and maintain neg calorie balance > defer f/u primary care including intermittently monitoring thyroid status

## 2019-11-27 NOTE — Progress Notes (Signed)
Kathleen Kathleen [name] July, female    DOB: 1950-09-15, 70 y.o.   MRN: [id number]   Brief Kathleen Kathleen [name] profile:     Kathleen [name] date:  10/22/2019     Discharge Date:  10/28/2019   Primary MD  Kathleen Helper, MD  Recommendations for primary care physician for things to follow:   1)Avoid ibuprofen/Advil/Aleve/Motrin/Goody Powders/Naproxen/BC powders/Meloxicam/Diclofenac/Indomethacin and other Nonsteroidal anti-inflammatory medications as these will make you more likely to bleed and can cause stomach ulcers, can also cause Kidney problems.   2)insulin Lispro (HumaLOG) injection 0-10 Units 0-10 Units Subcutaneous, 3 times daily with meals CBG < 70: Implement Hypoglycemia Standing Orders and refer to Hypoglycemia Standing Orders sidebar report  CBG 70 - 120: 0 unit CBG 121 - 150: 0 unit  CBG 151 - 200: 1 unit CBG 201 - 250: 2 units CBG 251 - 300: 4 units CBG 301 - 350: 6 units  CBG 351 - 400: 8 units  CBG > 400: 10 units  3)Continue oxygen at 2L/min continously  4)CBC and BMP every Wednesday 10/31/19  5)outpatient thyroid biopsy--- due to thyroid nodule--this can be Kathleen [name] over the next 2 to 3 months  6) outpatient sleep study due to concerns about possible obstructive sleep apnea--you may need CPAP machine  7)2.7 x 2.1 cm rounded well-defined low density is noted in the left lower lobe--Kathleen Kathleen [name] will need outpatient PET scan   Kathleen [name] Diagnosis  Thyroid nodule [E04.1] Lung mass [R91.8] Pulmonary embolism (HCC) [I26.99] Pulmonary emboli (HCC) [I26.99] Other acute pulmonary embolism with acute cor pulmonale (HCC) [I26.09]   Discharge Diagnosis  Thyroid nodule [E04.1] Lung mass [R91.8] Pulmonary embolism (HCC) [I26.99] Pulmonary emboli (HCC) [I26.99] Other acute pulmonary embolism with acute cor pulmonale (HCC) [I26.09]    Principal Problem:   Pulmonary embolism (HCC) Active Problems:   Acute respiratory failure with hypoxia (HCC)   CKD (chronic kidney disease), stage IIIb    Type 2 diabetes mellitus with vascular disease (Kathleen [name])   Morbid obesity (Kathleen Kathleen [name])   Essential hypertension   GERD          Past Medical History:  Diagnosis Date  . Anemia   . Arthritis   . Chronic kidney disease   . Diabetes mellitus type II   . Dysphagia    unspecified   . GERD (gastroesophageal reflux disease)   . Hyperlipidemia 2000  . Hypertension 1995  . Insomnia   . Low back pain   . Obesity          Past Surgical History:  Procedure Laterality Date  . Carpal tunnel release     left   . CHOLECYSTECTOMY  2007  . DIGIT NAIL REMOVAL  08/2011  . KNEE SURGERY  1.20.2012   arthroscopy LEFT knee partial medial meniscectomy  . TENOTOMY     2,3,4  left foot   . toenail removal    . TUBAL LIGATION         HPI  from the history and physical Kathleen Kathleen [name]:    Kathleen Kathleen [name] S54 y.o.femaleWith PMHx relevant for CKD IIIb, HTN, DM, HLD, GERD, obesity, OApresents to the ED with worsening shortness of breath, worsening DOE and was found to be hypoxic in the ED --Kathleen Kathleen [name] apparently was having DOE and shob as well as leg pains over last couple of week, took a 4 hr trip to [place] arriving at [employer] she had a hard time walking from the elevator to her hotel room--- shoband DOE continue to worsen-- --today in the  ED she is found to have hypoxia with elevated D-dimer and tachycardia and CTA chest consistent with large right-sided PE and possible small left upper lobe PE -No frank chest pains no palpitations no dyspnea,specifically denies pleuritic type symptoms -Additional history obtained from her sister bedside -Kathleen Kathleen [name]'s younger sister who is 2 years younger recently diagnosed with stage IV pancreatic cancer   CTA ChestIMPRESSION: 1). Large right-sided pulmonary emboli are noted with CT evidence of right heart strain (RV/LV Ratio = 1.0) consistent with at least submassive (intermediate risk) PE. The presence  of right heart strain has been associated with an increased risk of morbidity and mortality. 2)2.6 cm left thyroid nodule is noted. Recommend thyroid US.  3)2.7 x 2.1 cm rounded well-defined low density is noted in the left lower lobe with average Hounsfield measurement of 40. It is uncertain if this represents possible complex pericardial cyst or mass. PET scan is recommended for further evaluation.  --Troponin is47, repeat is42 BNP is 159 hgb 11.6 Creatinine is 1.86with recent baseline usually around 1.5-1.6 -D-dimer 19.18     Hospital Course:    Brief Summary:- 69 y.o.femaleWith PMHx relevant for CKD IIIb, HTN, DM, HLD, GERD, obesity, OApresents to the ED with worsening shortness of breath, worsening DOEand hypoxia subsequently admitted on 10/22/2019 with acute PE   A/p  1)Acute Bil PE----large right-sided submassive PE and small left upper lobe PE---Echo with preserved EF of 65 to 70% with  significant right heart strain on echo -LEvenous Dopplers without DVT -Kathleen Kathleen [name] may benefit from abdominal pelvic imaging -Kathleen Kathleen [name]'s SHOB and DOE started prior to trip to the beach,symptoms worsened immediately after she arrived at the beach hotel could even walk up from the elevator to her hotel room --This PE may have been present prior to Kathleen Kathleen [name]'s for a trip to the beach symptoms only worsened on that particular day rather than started that day -Kathleen Kathleen [name] will need malignancy work-up as part of work-up of a hypercoagulable state -Last colonoscopy more than 10 years ago apparently,last mammogram 2021 -CTAchest also showed 2.7 x 2.1 cm rounded well-defined low density is noted in the left lower lobe  Initially treated with IV heparin drip transitioned to [place] on 10/25/19 --Hypoxia persist , unable to wean off oxygen at this time -continue supplemental oxygen  2)CKD IIIb--Creatinine is down to 1.81from 1.86 on Kathleen [name] --recent baseline usually around  1.5-1.6 -Kathleen Kathleen [name] had contrast/CTA study continue to hold Aldactone/hctz, hold ramipril --renally adjust medications, avoid nephrotoxic agents / dehydration / hypotension  - 3)DM2- A1c 8.3 ,reflecting uncontrolled diabetes PTAhad hypoglycemic episode in the ED, -PTA was on Lantus 60 mg twice daily, changed to 40 mg daily for now along with sliding scale coverage  4)HTN-continue amlodipine and clonidine ---hold ramipril and Aldactone/HCTZ due to kidney concerns  5)HLD--stable, continue pravastatin and aspirin  6)obesity and osteoarthritis----outpatient management as advised  7)acute hypoxic respiratory failure secondary to pulmonary embolism ---secondary to #1 above, manage as above #1 --Hypoxia persist , unable to wean off oxygen at this time  8)-De-Saturations when she falls asleep suggesting possible OSA=--outpatient sleep study advised  9)Thyroid Nodule---- 2.6 cm left inferior TR 4 nodule meets criteria for biopsy as above. This correlates with the CT finding. -Kathleen Kathleen [name] will need outpatient thyroid biopsy  10)chronic normocytic normochromic anemia--baseline hemoglobin usually between 10 and 11---etiology unclear -Monitor closely with anticoagulation use  Dispo: The Kathleen Kathleen [name] is from:Home Anticipated d/c is to:  SNF        History of Present Illness  11/27/2019  Pulmonary/ 1st office eval/Renada Cronin /  never smoker with PE/ ? Lung nodule vs infarct Chief Complaint  Kathleen Kathleen [name] presents with  . Hospitalization Follow-up    July 2nd finished rehab. Has oxygen but has not used it since getting Kathleen [name] with rehab. Denies shortness of breath. Dry cough  Dyspnea:  R knee stops her before breathing / walks with cane/  Cough: dry sporadic sometimes worse hs but does not wake her up  Sleep: able to sleep flat bed on 2 pillows  SABA use: none 02 :  Not using since d/c   No obvious day to day or daytime variability or assoc excess/ purulent sputum or  mucus plugs or hemoptysis or cp or chest tightness, subjective wheeze or overt sinus or hb symptoms.   Sleeping  without nocturnal  or early am exacerbation  of respiratory  c/o's or need for noct saba. Also denies any obvious fluctuation of symptoms with weather or environmental changes or other aggravating or alleviating factors except as outlined above   No unusual exposure hx or h/o childhood pna/ asthma or knowledge of premature birth.  Current Allergies, Complete Past Medical History, Past Surgical History, Family History, and Social History were reviewed in [employer] record.  ROS  The following are not active complaints unless bolded Hoarseness, sore throat, dysphagia, dental problems, itching, sneezing,  nasal congestion or discharge of excess mucus or purulent secretions, ear ache,   fever, chills, sweats, unintended wt loss or wt gain, classically pleuritic or exertional cp,  orthopnea pnd or arm/hand swelling  or leg swelling, presyncope, palpitations, abdominal pain, anorexia, nausea, vomiting, diarrhea  or change in bowel habits or change in bladder habits, change in stools or change in urine, dysuria, hematuria,  rash, arthralgias, visual complaints, headache, numbness, weakness or ataxia or problems with walking or coordination,  change in mood or  memory.             Past Medical History:  Diagnosis Date  . Anemia   . Arthritis   . Chronic kidney disease   . Diabetes mellitus type II   . Dysphagia    unspecified   . GERD (gastroesophageal reflux disease)   . Hyperlipidemia 2000  . Hypertension 1995  . Insomnia   . Low back pain   . Obesity   . Sleep apnea in adult 11/25/2019  . Thyroid nodule 11/25/2019    Outpatient Medications Prior to Visit  Medication Sig Dispense Refill  . acetaminophen (TYLENOL) 325 MG tablet Take 2 tablets (650 mg total) by mouth every 6 (six) hours as needed for mild pain, fever or headache (or Fever >/= 101). 12  tablet 0  . amLODipine (NORVASC) 10 MG tablet TAKE ONE TABLET BY MOUTH ONCE DAILY FOR BLOOD PRESSURE. (Kathleen Kathleen [name] taking differently: Take 10 mg by mouth daily. FOR BLOOD PRESSURE.) 90 tablet 0  . Apixaban Starter Pack, 10mg  and 5mg , (ELIQUIS DVT/PE STARTER PACK) Take as directed on package: start with two-5mg  tablets twice daily for 7 days. On day 8, switch to one-5mg  tablet twice daily. 1 each 0  . cholecalciferol (VITAMIN D) 1000 UNITS tablet Take 1,000 Units by mouth daily.    . cloNIDine (CATAPRES) 0.3 MG tablet TAKE (1) TABLET BY MOUTH 3 TIMES DAILY. (Kathleen Kathleen [name] taking differently: Take 0.3 mg by mouth 3 (three) times daily. ) 270 tablet 0  . fluticasone (FLONASE) 50 MCG/ACT nasal spray Place 2 sprays into both nostrils daily as needed. (Kathleen Kathleen [name] taking differently: Place 2 sprays into both nostrils daily as needed for allergies  or rhinitis. ) 15 g 1  . gabapentin (NEURONTIN) 100 MG capsule Take 1 capsule (100 mg total) by mouth 2 (two) times daily. 60 capsule 3  . insulin glargine (LANTUS SOLOSTAR) 100 UNIT/ML Solostar Pen Inject 40 Units into the skin at bedtime. 30 mL 0  . lisinopril (ZESTRIL) 10 MG tablet Take 1 tablet (10 mg total) by mouth daily. 30 tablet 3  . pantoprazole (PROTONIX) 40 MG tablet Take 1 tablet (40 mg total) by mouth daily. 30 tablet 3  . polyethylene glycol (MIRALAX / GLYCOLAX) 17 g packet Take 17 g by mouth daily. 30 each 1  . pravastatin (PRAVACHOL) 40 MG tablet Take 1 tablet (40 mg total) by mouth daily. 30 tablet 5  . rosuvastatin (CRESTOR) 5 MG tablet Take 5 mg by mouth daily.    Marland Kitchen senna-docusate (SENOKOT-S) 8.6-50 MG tablet Take 2 tablets by mouth 2 (two) times daily. 120 tablet 2   No facility-administered medications prior to visit.     Objective:     BP (!) 144/64 (BP Location: Left Arm, Kathleen Kathleen [name] Position: Sitting, Cuff Size: Large)   Pulse 100   Temp 99.1 F (37.3 C) (Oral)   Ht 5\' 6"  (1.676 m)   Wt 220 lb (99.8 kg)   SpO2 97% Comment: on room air  BMI  35.51 kg/m   SpO2: 97 % (on room air)   Pleasant amb bf walks with cane   HEENT : pt wearing mask not removed for exam due to covid -19 concerns.    NECK :  without JVD/Nodes/TM/ nl carotid upstrokes bilaterally   LUNGS: no acc muscle use,  Nl contour chest which is clear to A and P bilaterally without cough on insp or exp maneuvers   CV:  RRR  no s3 or murmur or increase in P2, and trace sym LE  edema bilaterally sym   ABD:  Obese soft and nontender with nl inspiratory excursion in the supine position. No bruits or organomegaly appreciated, bowel sounds nl  MS:  Nl gait/ ext warm without deformities, calf tenderness, cyanosis or clubbing No obvious joint restrictions   SKIN: warm and dry without lesions    NEURO:  alert, approp, nl sensorium with  no motor or cerebellar deficits apparent.       Assessment   Pulmonary embolism ([place]) Dx 10/22/19 p car trip to [place] and back in pt with  - CTa 10/22/19 large right-sided submassive PE and small left upper lobe PE - ECho 10/22/19 Right ventricular systolic function is mildly reduced. The right  ventricular size is mildly enlarged.  Right atrial size was mildly dilated.  -LEvenous Dopplers 10/22/19  without DVT  Back to baseline doe/ leg swelling persists but is mild and likely related to use of amlodipine  Will need f/u echo at 3 m on rx to r/o evolving TEPAH and decision at 6 m whether to continue high dose, intermediate dose of asa and welcome hematology input at that point as her risk factors = prior PE, MO and relatively sedentary secondary to DJD  >>> f/u by televisit in 3 m after studies available.    Abnormal CT scan, chest Never smoker - dt a 10/22/19 :  LLL 2.7 x 2.1 ? Cystic lesion vs mass in setting of Acute life threatening PE - placed reminder file for reCT 01/22/20   I though this might just be an infarct but probably some form of benign cyst, no likely ca and no urgency to attempt any kind  of invasive w/u in  setting of recent PE so continue DOAC and f/u with a 3 month CT s contrast / echo - if lesion persists but echo nl will consider PET in anticipation of bx / surgical intervention which should be postponed until then  Discussed in detail all the  indications, usual  risks and alternatives  relative to the benefits with Kathleen Kathleen [name] who agrees to proceed with w/u as outlined.       Morbid obesity ([place]) Complicated by DM/ hbp and now PE  Body mass index is 35.51 kg/m.  -   Lab Results  Component Value Date   TSH 1.110 10/22/2019     Contributing to gerd risk/ doe/reviewed the need and the process to achieve and maintain neg calorie balance > defer f/u primary care including intermittently monitoring thyroid status      Supplemental oxygen dependent D/c as of 11/27/2019 > not using/ sats fine    Essential hypertension Adequate control on present rx, reviewed in detail with pt > no change in rx needed    However, the acei is probably contributing to sporadic dry cough and the amlopidine to the swelling > would favor trial of ARB/continue lasix but defer final rx/ f/u to Dr Griffin Dakin capable hands     Medical decision making was a high  level of complexity in this case because of  two chronic conditions /diagnoses requiring extra time for  H and P, chart review, counselin= , 6min post hosp ov  and generating customized AVS unique to this office visit and charting.   Each maintenance medication was reviewed in detail including emphasizing most importantly the difference between maintenance and prns and under what circumstances the prns are to be triggered using an action plan format where appropriate. Please see avs for details which were reviewed in writing by both me and my nurse and Kathleen Kathleen [name] given a written copy highlighted where appropriate with yellow highlighter for the Kathleen Kathleen [name]'s continued care at home along with an updated version of their medications.  Kathleen Kathleen [name] was asked to maintain  medication reconciliation by comparing this list to the actual medications being used at home and to contact this office right away if there is a conflict or discrepancy.      Christinia Gully, MD 11/27/2019

## 2019-11-27 NOTE — Assessment & Plan Note (Signed)
D/c as of 11/27/2019 > not using/ sats fine

## 2019-11-28 DIAGNOSIS — Z7901 Long term (current) use of anticoagulants: Secondary | ICD-10-CM | POA: Diagnosis not present

## 2019-11-28 DIAGNOSIS — Z794 Long term (current) use of insulin: Secondary | ICD-10-CM | POA: Diagnosis not present

## 2019-11-28 DIAGNOSIS — R1310 Dysphagia, unspecified: Secondary | ICD-10-CM | POA: Diagnosis not present

## 2019-11-28 DIAGNOSIS — G4700 Insomnia, unspecified: Secondary | ICD-10-CM | POA: Diagnosis not present

## 2019-11-28 DIAGNOSIS — D631 Anemia in chronic kidney disease: Secondary | ICD-10-CM | POA: Diagnosis not present

## 2019-11-28 DIAGNOSIS — E041 Nontoxic single thyroid nodule: Secondary | ICD-10-CM | POA: Diagnosis not present

## 2019-11-28 DIAGNOSIS — I129 Hypertensive chronic kidney disease with stage 1 through stage 4 chronic kidney disease, or unspecified chronic kidney disease: Secondary | ICD-10-CM | POA: Diagnosis not present

## 2019-11-28 DIAGNOSIS — K219 Gastro-esophageal reflux disease without esophagitis: Secondary | ICD-10-CM | POA: Diagnosis not present

## 2019-11-28 DIAGNOSIS — E785 Hyperlipidemia, unspecified: Secondary | ICD-10-CM | POA: Diagnosis not present

## 2019-11-28 DIAGNOSIS — M545 Low back pain: Secondary | ICD-10-CM | POA: Diagnosis not present

## 2019-11-28 DIAGNOSIS — N1832 Chronic kidney disease, stage 3b: Secondary | ICD-10-CM | POA: Diagnosis not present

## 2019-11-28 DIAGNOSIS — M129 Arthropathy, unspecified: Secondary | ICD-10-CM | POA: Diagnosis not present

## 2019-11-28 DIAGNOSIS — I2699 Other pulmonary embolism without acute cor pulmonale: Secondary | ICD-10-CM | POA: Diagnosis not present

## 2019-11-28 DIAGNOSIS — Z9981 Dependence on supplemental oxygen: Secondary | ICD-10-CM | POA: Diagnosis not present

## 2019-11-28 DIAGNOSIS — E1122 Type 2 diabetes mellitus with diabetic chronic kidney disease: Secondary | ICD-10-CM | POA: Diagnosis not present

## 2019-11-28 DIAGNOSIS — R918 Other nonspecific abnormal finding of lung field: Secondary | ICD-10-CM | POA: Diagnosis not present

## 2019-11-28 DIAGNOSIS — E1159 Type 2 diabetes mellitus with other circulatory complications: Secondary | ICD-10-CM | POA: Diagnosis not present

## 2019-12-03 ENCOUNTER — Other Ambulatory Visit: Payer: Self-pay

## 2019-12-03 ENCOUNTER — Telehealth (INDEPENDENT_AMBULATORY_CARE_PROVIDER_SITE_OTHER): Payer: Medicare Other

## 2019-12-03 VITALS — BP 144/64 | Ht 66.0 in | Wt 220.0 lb

## 2019-12-03 DIAGNOSIS — Z780 Asymptomatic menopausal state: Secondary | ICD-10-CM | POA: Diagnosis not present

## 2019-12-03 DIAGNOSIS — Z0000 Encounter for general adult medical examination without abnormal findings: Secondary | ICD-10-CM

## 2019-12-03 DIAGNOSIS — Z1211 Encounter for screening for malignant neoplasm of colon: Secondary | ICD-10-CM

## 2019-12-03 DIAGNOSIS — E1159 Type 2 diabetes mellitus with other circulatory complications: Secondary | ICD-10-CM | POA: Diagnosis not present

## 2019-12-03 NOTE — Progress Notes (Signed)
Subjective:   Kathleen [name] is a 69 y.o. female who presents for Medicare Annual (Subsequent) preventive examination.  Review of Systems     Cardiac Risk Factors include: advanced age (>28men, >86 women);diabetes mellitus;dyslipidemia;hypertension;obesity (BMI >30kg/m2);sedentary lifestyle     Objective:    Today's Vitals   12/03/19 1101  BP: (!) 144/64  Weight: (!) 220 lb (99.8 kg)  Height: 5\' 6"  (1.676 m)   Body mass index is 35.51 kg/m.  Advanced Directives 12/03/2019 10/22/2019 10/22/2019 04/27/2017 01/20/2017 04/20/2016  Does Patient Have a Medical Advance Directive? No No No No No No  Would patient like information on creating a medical advance directive? Yes (ED - Information included in AVS) No - Patient declined No - Patient declined No - Patient declined - Yes (ED - Information included in AVS)    Current Medications (verified) Outpatient Encounter Medications as of 12/03/2019  Medication Sig  . acetaminophen (TYLENOL) 325 MG tablet Take 2 tablets (650 mg total) by mouth every 6 (six) hours as needed for mild pain, fever or headache (or Fever >/= 101).  Marland Kitchen amLODipine (NORVASC) 10 MG tablet TAKE ONE TABLET BY MOUTH ONCE DAILY FOR BLOOD PRESSURE. (Patient taking differently: Take 10 mg by mouth daily. FOR BLOOD PRESSURE.)  . apixaban (ELIQUIS) 5 MG TABS tablet Take 1 tablet (5 mg total) by mouth 2 (two) times daily.  . cholecalciferol (VITAMIN D) 1000 UNITS tablet Take 1,000 Units by mouth daily.  . cloNIDine (CATAPRES) 0.3 MG tablet TAKE (1) TABLET BY MOUTH 3 TIMES DAILY. (Patient taking differently: Take 0.3 mg by mouth 3 (three) times daily. )  . fluticasone (FLONASE) 50 MCG/ACT nasal spray Place 2 sprays into both nostrils daily as needed. (Patient taking differently: Place 2 sprays into both nostrils daily as needed for allergies or rhinitis. )  . gabapentin (NEURONTIN) 100 MG capsule Take 1 capsule (100 mg total) by mouth 2 (two) times daily.  . insulin glargine  (LANTUS SOLOSTAR) 100 UNIT/ML Solostar Pen Inject 40 Units into the skin at bedtime.  Marland Kitchen lisinopril (ZESTRIL) 10 MG tablet Take 1 tablet (10 mg total) by mouth daily.  . pantoprazole (PROTONIX) 40 MG tablet Take 1 tablet (40 mg total) by mouth daily.  . polyethylene glycol (MIRALAX / GLYCOLAX) 17 g packet Take 17 g by mouth daily.  . pravastatin (PRAVACHOL) 40 MG tablet Take 1 tablet (40 mg total) by mouth daily.  . rosuvastatin (CRESTOR) 5 MG tablet Take 5 mg by mouth daily.  Marland Kitchen senna-docusate (SENOKOT-S) 8.6-50 MG tablet Take 2 tablets by mouth 2 (two) times daily.  . [DISCONTINUED] sitaGLIPtan (JANUVIA) 100 MG tablet Take 100 mg by mouth daily.     No facility-administered encounter medications on file as of 12/03/2019.    Allergies (verified) Patient has no known allergies.   History: Past Medical History:  Diagnosis Date  . Anemia   . Arthritis   . Chronic kidney disease   . Diabetes mellitus type II   . Dysphagia    unspecified   . GERD (gastroesophageal reflux disease)   . Hyperlipidemia 2000  . Hypertension 1995  . Insomnia   . Low back pain   . Obesity   . Sleep apnea in adult 11/25/2019  . Thyroid nodule 11/25/2019   Past Surgical History:  Procedure Laterality Date  . Carpal tunnel release     left   . CHOLECYSTECTOMY  2007  . DIGIT NAIL REMOVAL  08/2011  . KNEE SURGERY  1.20.2012   arthroscopy  LEFT knee partial medial meniscectomy  . TENOTOMY     2,3,4  left foot   . toenail removal    . TUBAL LIGATION     Family History  Problem Relation Age of Onset  . Diabetes Mother   . Hypertension Mother        MI  . Heart disease Mother   . Kidney disease Mother 45       dialysis  . Diabetes Sister   . Diabetes Brother   . Diabetes Brother   . Kidney disease Brother   . Dementia Sister   . Diabetes Father    Social History   Socioeconomic History  . Marital status: Divorced    Spouse name: Not on file  . Number of children: 4  . Years of education: Not  on file  . Highest education level: 11th grade  Occupational History  . Occupation: Full time Engineer, petroleum   Tobacco Use  . Smoking status: Never Smoker  . Smokeless tobacco: Never Used  Vaping Use  . Vaping Use: Never used  Substance and Sexual Activity  . Alcohol use: No  . Drug use: No  . Sexual activity: Yes  Other Topics Concern  . Not on file  Social History Narrative   Lives with 2nd oldest son   Social Determinants of Health   Financial Resource Strain: Low Risk   . Difficulty of Paying Living Expenses: Not hard at all  Food Insecurity: No Food Insecurity  . Worried About Charity fundraiser in the Last Year: Never true  . Ran Out of Food in the Last Year: Never true  Transportation Needs: No Transportation Needs  . Lack of Transportation (Medical): No  . Lack of Transportation (Non-Medical): No  Physical Activity: Insufficiently Active  . Days of Exercise per Week: 2 days  . Minutes of Exercise per Session: 10 min  Stress: No Stress Concern Present  . Feeling of Stress : Not at all  Social Connections: Moderately Isolated  . Frequency of Communication with Friends and Family: More than three times a week  . Frequency of Social Gatherings with Friends and Family: More than three times a week  . Attends Religious Services: More than 4 times per year  . Active Member of Clubs or Organizations: No  . Attends Archivist Meetings: Never  . Marital Status: Divorced    Tobacco Counseling Counseling given: Not Answered   Clinical Intake:  Pre-visit preparation completed: Yes  Pain : No/denies pain     Nutritional Status: BMI > 30  Obese Diabetes: Yes  How often do you need to have someone help you when you read instructions, pamphlets, or other written materials from your doctor or pharmacy?: 2 - Rarely What is the last grade level you completed in school?: 11th grade  Diabetic? yes  Interpreter Needed?: No  Information entered by :: Senya Hinzman LPN   Activities of Daily Living In your present state of health, do you have any difficulty performing the following activities: 12/03/2019 10/22/2019  Hearing? N N  Vision? N N  Difficulty concentrating or making decisions? N N  Walking or climbing stairs? N N  Dressing or bathing? N N  Doing errands, shopping? N N  Comment doesn't drive- gets a family member to bring to appts -  Conservation officer, nature and eating ? N -  Using the Toilet? N -  In the past six months, have you accidently leaked urine? N -  Do  you have problems with loss of bowel control? N -  Managing your Medications? N -  Managing your Finances? N -  Housekeeping or managing your Housekeeping? N -  Some recent data might be hidden    Patient Care Team: Fayrene Helper, MD as PCP - General  Indicate any recent Medical Services you may have received from other than [facility] providers in the past year (date may be approximate).     Assessment:   This is a routine wellness examination for Shailyn.  Hearing/Vision screen No exam data present  Dietary issues and exercise activities discussed: Current Exercise Habits: The patient does not participate in regular exercise at present, Exercise limited by: orthopedic condition(s)  Goals    . HEMOGLOBIN A1C < 7.0    . Increase physical activity     Start walking 5 days a week for 15-20 mins    . Reduce carbohydrate intake     Starting 04/20/2016 try to decrease amount of carbohydrates in your diet.      Depression Screen PHQ 2/9 Scores 12/03/2019 09/03/2019 01/16/2019 11/15/2018 10/27/2018 09/25/2018 09/20/2018  PHQ - 2 Score 0 0 0 0 0 0 0  PHQ- 9 Score - - - - - - -    Fall Risk Fall Risk  12/03/2019 11/22/2019 09/03/2019 05/21/2019 03/06/2019  Falls in the past year? 0 0 0 0 0  Number falls in past yr: 0 0 0 0 0  Injury with Fall? 0 0 0 0 0  Risk for fall due to : - - - - -  Follow up - - Falls evaluation completed - -    Any stairs in or around the home? No  If so,  are there any without handrails? No  Home free of loose throw rugs in walkways, pet beds, electrical cords, etc? Yes  Adequate lighting in your home to reduce risk of falls? Yes   ASSISTIVE DEVICES UTILIZED TO PREVENT FALLS:  Life alert? No  Use of a cane, walker or w/c? Yes cane Grab bars in the bathroom? No  Shower chair or bench in shower? Yes  Elevated toilet seat or a handicapped toilet? No   TIMED UP AND GO:  Was the test performed? No .  Length of time to ambulate 10 feet:  sec.     Cognitive Function:     6CIT Screen 12/03/2019 09/20/2018 04/27/2017 04/20/2016  What Year? 0 points 0 points 0 points 0 points  What month? 0 points 0 points 0 points 0 points  What time? 0 points 0 points 0 points 0 points  Count back from 20 0 points 0 points 0 points 0 points  Months in reverse 0 points 0 points 0 points 0 points  Repeat phrase 2 points 0 points 0 points 0 points  Total Score 2 0 0 0    Immunizations Immunization History  Administered Date(s) Administered  . Fluad Quad(high Dose 65+) 03/06/2019  . Influenza Split 02/23/2011, 03/14/2012  . Influenza Whole 03/24/2007, 02/21/2008, 02/26/2010  . Influenza,inj,Quad PF,6+ Mos 04/23/2014, 04/15/2015, 12/29/2015, 02/14/2017, 12/27/2017  . Moderna SARS-COVID-2 Vaccination 07/26/2019, 08/28/2019  . Pneumococcal Conjugate-13 12/26/2013  . Pneumococcal Polysaccharide-23 11/29/2008, 08/12/2015  . Td 02/03/2009  . Zoster 10/28/2010    TDAP status: Due, Education has been provided regarding the importance of this vaccine. Advised may receive this vaccine at local pharmacy or Health Dept. Aware to provide a copy of the vaccination record if obtained from local pharmacy or Health Dept. Verbalized acceptance and  understanding. Flu Vaccine status: Up to date Pneumococcal vaccine status: Up to date Covid-19 vaccine status: Completed vaccines  Qualifies for Shingles Vaccine? Yes   Zostavax completed Yes   Shingrix Completed?: No.     Education has been provided regarding the importance of this vaccine. Patient has been advised to call insurance company to determine out of pocket expense if they have not yet received this vaccine. Advised may also receive vaccine at local pharmacy or Health Dept. Verbalized acceptance and understanding.  Screening Tests Health Maintenance  Topic Date Due  . OPHTHALMOLOGY EXAM  06/07/2019  . COLONOSCOPY  12/02/2019  . TETANUS/TDAP  03/05/2020 (Originally 02/04/2019)  . INFLUENZA VACCINE  12/09/2019  . FOOT EXAM  03/10/2020  . HEMOGLOBIN A1C  04/22/2020  . MAMMOGRAM  06/24/2021  . DEXA SCAN  Completed  . COVID-19 Vaccine  Completed  . Hepatitis C Screening  Completed  . PNA vac Low Risk Adult  Completed    Health Maintenance  Health Maintenance Due  Topic Date Due  . OPHTHALMOLOGY EXAM  06/07/2019  . COLONOSCOPY  12/02/2019    Colorectal cancer screening: Referral to GI placed yes. Pt aware the office will call re: appt. Mammogram status: Completed yes. Repeat every year Bone Density status: Ordered yes. Pt provided with contact info and advised to call to schedule appt.  Lung Cancer Screening: (Low Dose CT Chest recommended if Age 70-80 years, 30 pack-year currently smoking OR have quit w/in 15years.) does not qualify.   Lung Cancer Screening Referral: does not qualify  Additional Screening:  Hepatitis C Screening: does qualify; Completed 12/16/2014  Vision Screening: Recommended annual ophthalmology exams for early detection of glaucoma and other disorders of the eye. Is the patient up to date with their annual eye exam?  No  Who is the provider or what is the name of the office in which the patient attends annual eye exams? Pt has appt to come to office 12/10/19 to get diabetic retinopathy exam by [facility] If pt is not established with a provider, would they like to be referred to a provider to establish care? No .   Dental Screening: Recommended annual dental exams for proper  oral hygiene  Community Resource Referral / Chronic Care Management: CRR required this visit?  No   CCM required this visit?  No      Plan:     I have personally reviewed and noted the following in the patient's chart:   . Medical and social history . Use of alcohol, tobacco or illicit drugs  . Current medications and supplements . Functional ability and status . Nutritional status . Physical activity . Advanced directives . List of other physicians . Hospitalizations, surgeries, and ER visits in previous 12 months . Vitals . Screenings to include cognitive, depression, and falls . Referrals and appointments  In addition, I have reviewed and discussed with patient certain preventive protocols, quality metrics, and best practice recommendations. A written personalized care plan for preventive services as well as general preventive health recommendations were provided to patient.     Kate Sable, LPN, LPN   [id number]/46/2703   Nurse Notes: Visit done by telephone. Patient at home and provider in the office. Time spent with patient- 30 mins

## 2019-12-03 NOTE — Patient Instructions (Signed)
Kathleen [name] , Thank you for taking time to come for your Medicare Wellness Visit. I appreciate your ongoing commitment to your health goals. Please review the following plan we discussed and let me know if I can assist you in the future.   Screening recommendations/referrals: Colonoscopy: due- referred to Marylee Floras- they will contact you by phone/mail Mammogram: up to date  Bone Density: due- call [phone] to schedule appt Recommended yearly ophthalmology/optometry visit for glaucoma screening and checkup Recommended yearly dental visit for hygiene and checkup  Vaccinations: Influenza vaccine: up to date  Pneumococcal vaccine: up to date  Tdap vaccine: not covered by medicare as a preventative vaccine Shingles vaccine: can receive at pharmacy if wanted      Next appointment: wellness in 1 year   Preventive Care 65 Years and Older, Female Preventive care refers to lifestyle choices and visits with your health care provider that can promote health and wellness. What does preventive care include?  A yearly physical exam. This is also called an annual well check.  Dental exams once or twice a year.  Routine eye exams. Ask your health care provider how often you should have your eyes checked.  Personal lifestyle choices, including:  Daily care of your teeth and gums.  Regular physical activity.  Eating a healthy diet.  Avoiding tobacco and drug use.  Limiting alcohol use.  Practicing safe sex.  Taking low-dose aspirin every day.  Taking vitamin and mineral supplements as recommended by your health care provider. What happens during an annual well check? The services and screenings done by your health care provider during your annual well check will depend on your age, overall health, lifestyle risk factors, and family history of disease. Counseling  Your health care provider may ask you questions about your:  Alcohol use.  Tobacco use.  Drug  use.  Emotional well-being.  Home and relationship well-being.  Sexual activity.  Eating habits.  History of falls.  Memory and ability to understand (cognition).  Work and work Statistician.  Reproductive health. Screening  You may have the following tests or measurements:  Height, weight, and BMI.  Blood pressure.  Lipid and cholesterol levels. These may be checked every 5 years, or more frequently if you are over 25 years old.  Skin check.  Lung cancer screening. You may have this screening every year starting at age 2 if you have a 30-pack-year history of smoking and currently smoke or have quit within the past 15 years.  Fecal occult blood test (FOBT) of the stool. You may have this test every year starting at age 89.  Flexible sigmoidoscopy or colonoscopy. You may have a sigmoidoscopy every 5 years or a colonoscopy every 10 years starting at age 76.  Hepatitis C blood test.  Hepatitis B blood test.  Sexually transmitted disease (STD) testing.  Diabetes screening. This is done by checking your blood sugar (glucose) after you have not eaten for a while (fasting). You may have this done every 1-3 years.  Bone density scan. This is done to screen for osteoporosis. You may have this done starting at age 27.  Mammogram. This may be done every 1-2 years. Talk to your health care provider about how often you should have regular mammograms. Talk with your health care provider about your test results, treatment options, and if necessary, the need for more tests. Vaccines  Your health care provider may recommend certain vaccines, such as:  Influenza vaccine. This is recommended every year.  Tetanus,  diphtheria, and acellular pertussis (Tdap, Td) vaccine. You may need a Td booster every 10 years.  Zoster vaccine. You may need this after age 83.  Pneumococcal 13-valent conjugate (PCV13) vaccine. One dose is recommended after age 62.  Pneumococcal polysaccharide  (PPSV23) vaccine. One dose is recommended after age 76. Talk to your health care provider about which screenings and vaccines you need and how often you need them. This information is not intended to replace advice given to you by your health care provider. Make sure you discuss any questions you have with your health care provider. Document Released: 05/23/2015 Document Revised: 01/14/2016 Document Reviewed: 02/25/2015 Elsevier Interactive Patient Education  2017 [employer] Prevention in the Home Falls can cause injuries. They can happen to people of all ages. There are many things you can do to make your home safe and to help prevent falls. What can I do on the outside of my home?  Regularly fix the edges of walkways and driveways and fix any cracks.  Remove anything that might make you trip as you walk through a door, such as a raised step or threshold.  Trim any bushes or trees on the path to your home.  Use bright outdoor lighting.  Clear any walking paths of anything that might make someone trip, such as rocks or tools.  Regularly check to see if handrails are loose or broken. Make sure that both sides of any steps have handrails.  Any raised decks and porches should have guardrails on the edges.  Have any leaves, snow, or ice cleared regularly.  Use sand or salt on walking paths during winter.  Clean up any spills in your garage right away. This includes oil or grease spills. What can I do in the bathroom?  Use night lights.  Install grab bars by the toilet and in the tub and shower. Do not use towel bars as grab bars.  Use non-skid mats or decals in the tub or shower.  If you need to sit down in the shower, use a plastic, non-slip stool.  Keep the floor dry. Clean up any water that spills on the floor as soon as it happens.  Remove soap buildup in the tub or shower regularly.  Attach bath mats securely with double-sided non-slip rug tape.  Do not have  throw rugs and other things on the floor that can make you trip. What can I do in the bedroom?  Use night lights.  Make sure that you have a light by your bed that is easy to reach.  Do not use any sheets or blankets that are too big for your bed. They should not hang down onto the floor.  Have a firm chair that has side arms. You can use this for support while you get dressed.  Do not have throw rugs and other things on the floor that can make you trip. What can I do in the kitchen?  Clean up any spills right away.  Avoid walking on wet floors.  Keep items that you use a lot in easy-to-reach places.  If you need to reach something above you, use a strong step stool that has a grab bar.  Keep electrical cords out of the way.  Do not use floor polish or wax that makes floors slippery. If you must use wax, use non-skid floor wax.  Do not have throw rugs and other things on the floor that can make you trip. What can I do with  my stairs?  Do not leave any items on the stairs.  Make sure that there are handrails on both sides of the stairs and use them. Fix handrails that are broken or loose. Make sure that handrails are as long as the stairways.  Check any carpeting to make sure that it is firmly attached to the stairs. Fix any carpet that is loose or worn.  Avoid having throw rugs at the top or bottom of the stairs. If you do have throw rugs, attach them to the floor with carpet tape.  Make sure that you have a light switch at the top of the stairs and the bottom of the stairs. If you do not have them, ask someone to add them for you. What else can I do to help prevent falls?  Wear shoes that:  Do not have high heels.  Have rubber bottoms.  Are comfortable and fit you well.  Are closed at the toe. Do not wear sandals.  If you use a stepladder:  Make sure that it is fully opened. Do not climb a closed stepladder.  Make sure that both sides of the stepladder are  locked into place.  Ask someone to hold it for you, if possible.  Clearly mark and make sure that you can see:  Any grab bars or handrails.  First and last steps.  Where the edge of each step is.  Use tools that help you move around (mobility aids) if they are needed. These include:  Canes.  Walkers.  Scooters.  Crutches.  Turn on the lights when you go into a dark area. Replace any light bulbs as soon as they burn out.  Set up your furniture so you have a clear path. Avoid moving your furniture around.  If any of your floors are uneven, fix them.  If there are any pets around you, be aware of where they are.  Review your medicines with your doctor. Some medicines can make you feel dizzy. This can increase your chance of falling. Ask your doctor what other things that you can do to help prevent falls. This information is not intended to replace advice given to you by your health care provider. Make sure you discuss any questions you have with your health care provider. Document Released: 02/20/2009 Document Revised: 10/02/2015 Document Reviewed: 05/31/2014 Elsevier Interactive Patient Education  2017 [employer].

## 2019-12-04 ENCOUNTER — Encounter: Payer: Self-pay | Admitting: Internal Medicine

## 2019-12-05 ENCOUNTER — Other Ambulatory Visit: Payer: Self-pay | Admitting: Family Medicine

## 2019-12-05 DIAGNOSIS — E1159 Type 2 diabetes mellitus with other circulatory complications: Secondary | ICD-10-CM

## 2019-12-05 DIAGNOSIS — E785 Hyperlipidemia, unspecified: Secondary | ICD-10-CM

## 2019-12-07 DIAGNOSIS — E041 Nontoxic single thyroid nodule: Secondary | ICD-10-CM | POA: Diagnosis not present

## 2019-12-07 DIAGNOSIS — I129 Hypertensive chronic kidney disease with stage 1 through stage 4 chronic kidney disease, or unspecified chronic kidney disease: Secondary | ICD-10-CM | POA: Diagnosis not present

## 2019-12-07 DIAGNOSIS — R1310 Dysphagia, unspecified: Secondary | ICD-10-CM | POA: Diagnosis not present

## 2019-12-07 DIAGNOSIS — M545 Low back pain: Secondary | ICD-10-CM | POA: Diagnosis not present

## 2019-12-07 DIAGNOSIS — E1122 Type 2 diabetes mellitus with diabetic chronic kidney disease: Secondary | ICD-10-CM | POA: Diagnosis not present

## 2019-12-07 DIAGNOSIS — Z9981 Dependence on supplemental oxygen: Secondary | ICD-10-CM | POA: Diagnosis not present

## 2019-12-07 DIAGNOSIS — R918 Other nonspecific abnormal finding of lung field: Secondary | ICD-10-CM | POA: Diagnosis not present

## 2019-12-07 DIAGNOSIS — K219 Gastro-esophageal reflux disease without esophagitis: Secondary | ICD-10-CM | POA: Diagnosis not present

## 2019-12-07 DIAGNOSIS — D631 Anemia in chronic kidney disease: Secondary | ICD-10-CM | POA: Diagnosis not present

## 2019-12-07 DIAGNOSIS — M129 Arthropathy, unspecified: Secondary | ICD-10-CM | POA: Diagnosis not present

## 2019-12-07 DIAGNOSIS — Z794 Long term (current) use of insulin: Secondary | ICD-10-CM | POA: Diagnosis not present

## 2019-12-07 DIAGNOSIS — E1159 Type 2 diabetes mellitus with other circulatory complications: Secondary | ICD-10-CM | POA: Diagnosis not present

## 2019-12-07 DIAGNOSIS — G4700 Insomnia, unspecified: Secondary | ICD-10-CM | POA: Diagnosis not present

## 2019-12-07 DIAGNOSIS — I2699 Other pulmonary embolism without acute cor pulmonale: Secondary | ICD-10-CM | POA: Diagnosis not present

## 2019-12-07 DIAGNOSIS — E785 Hyperlipidemia, unspecified: Secondary | ICD-10-CM | POA: Diagnosis not present

## 2019-12-07 DIAGNOSIS — Z7901 Long term (current) use of anticoagulants: Secondary | ICD-10-CM | POA: Diagnosis not present

## 2019-12-07 DIAGNOSIS — N1832 Chronic kidney disease, stage 3b: Secondary | ICD-10-CM | POA: Diagnosis not present

## 2019-12-10 ENCOUNTER — Ambulatory Visit: Payer: Medicare Other

## 2019-12-12 DIAGNOSIS — I129 Hypertensive chronic kidney disease with stage 1 through stage 4 chronic kidney disease, or unspecified chronic kidney disease: Secondary | ICD-10-CM

## 2019-12-12 DIAGNOSIS — I2699 Other pulmonary embolism without acute cor pulmonale: Secondary | ICD-10-CM

## 2019-12-12 DIAGNOSIS — M545 Low back pain: Secondary | ICD-10-CM

## 2019-12-12 DIAGNOSIS — E1122 Type 2 diabetes mellitus with diabetic chronic kidney disease: Secondary | ICD-10-CM

## 2019-12-12 DIAGNOSIS — D631 Anemia in chronic kidney disease: Secondary | ICD-10-CM

## 2019-12-12 DIAGNOSIS — N1832 Chronic kidney disease, stage 3b: Secondary | ICD-10-CM

## 2019-12-12 DIAGNOSIS — E1159 Type 2 diabetes mellitus with other circulatory complications: Secondary | ICD-10-CM

## 2019-12-12 DIAGNOSIS — M129 Arthropathy, unspecified: Secondary | ICD-10-CM

## 2019-12-12 DIAGNOSIS — E041 Nontoxic single thyroid nodule: Secondary | ICD-10-CM

## 2019-12-12 DIAGNOSIS — G4700 Insomnia, unspecified: Secondary | ICD-10-CM

## 2019-12-13 DIAGNOSIS — M129 Arthropathy, unspecified: Secondary | ICD-10-CM

## 2019-12-13 DIAGNOSIS — N1832 Chronic kidney disease, stage 3b: Secondary | ICD-10-CM

## 2019-12-13 DIAGNOSIS — E1159 Type 2 diabetes mellitus with other circulatory complications: Secondary | ICD-10-CM

## 2019-12-13 DIAGNOSIS — D631 Anemia in chronic kidney disease: Secondary | ICD-10-CM

## 2019-12-13 DIAGNOSIS — E041 Nontoxic single thyroid nodule: Secondary | ICD-10-CM

## 2019-12-13 DIAGNOSIS — E1122 Type 2 diabetes mellitus with diabetic chronic kidney disease: Secondary | ICD-10-CM

## 2019-12-13 DIAGNOSIS — M545 Low back pain: Secondary | ICD-10-CM

## 2019-12-13 DIAGNOSIS — I2699 Other pulmonary embolism without acute cor pulmonale: Secondary | ICD-10-CM

## 2019-12-13 DIAGNOSIS — I129 Hypertensive chronic kidney disease with stage 1 through stage 4 chronic kidney disease, or unspecified chronic kidney disease: Secondary | ICD-10-CM

## 2019-12-13 DIAGNOSIS — G4700 Insomnia, unspecified: Secondary | ICD-10-CM

## 2019-12-17 ENCOUNTER — Ambulatory Visit: Payer: Medicare Other

## 2019-12-19 DIAGNOSIS — R1310 Dysphagia, unspecified: Secondary | ICD-10-CM | POA: Diagnosis not present

## 2019-12-19 DIAGNOSIS — K219 Gastro-esophageal reflux disease without esophagitis: Secondary | ICD-10-CM | POA: Diagnosis not present

## 2019-12-19 DIAGNOSIS — R918 Other nonspecific abnormal finding of lung field: Secondary | ICD-10-CM | POA: Diagnosis not present

## 2019-12-19 DIAGNOSIS — M545 Low back pain: Secondary | ICD-10-CM | POA: Diagnosis not present

## 2019-12-19 DIAGNOSIS — Z794 Long term (current) use of insulin: Secondary | ICD-10-CM | POA: Diagnosis not present

## 2019-12-19 DIAGNOSIS — M129 Arthropathy, unspecified: Secondary | ICD-10-CM | POA: Diagnosis not present

## 2019-12-19 DIAGNOSIS — I2699 Other pulmonary embolism without acute cor pulmonale: Secondary | ICD-10-CM | POA: Diagnosis not present

## 2019-12-19 DIAGNOSIS — E1159 Type 2 diabetes mellitus with other circulatory complications: Secondary | ICD-10-CM | POA: Diagnosis not present

## 2019-12-19 DIAGNOSIS — Z9981 Dependence on supplemental oxygen: Secondary | ICD-10-CM | POA: Diagnosis not present

## 2019-12-19 DIAGNOSIS — I129 Hypertensive chronic kidney disease with stage 1 through stage 4 chronic kidney disease, or unspecified chronic kidney disease: Secondary | ICD-10-CM | POA: Diagnosis not present

## 2019-12-19 DIAGNOSIS — Z7901 Long term (current) use of anticoagulants: Secondary | ICD-10-CM | POA: Diagnosis not present

## 2019-12-19 DIAGNOSIS — N1832 Chronic kidney disease, stage 3b: Secondary | ICD-10-CM | POA: Diagnosis not present

## 2019-12-19 DIAGNOSIS — D631 Anemia in chronic kidney disease: Secondary | ICD-10-CM | POA: Diagnosis not present

## 2019-12-19 DIAGNOSIS — E1122 Type 2 diabetes mellitus with diabetic chronic kidney disease: Secondary | ICD-10-CM | POA: Diagnosis not present

## 2019-12-19 DIAGNOSIS — G4700 Insomnia, unspecified: Secondary | ICD-10-CM | POA: Diagnosis not present

## 2019-12-19 DIAGNOSIS — E041 Nontoxic single thyroid nodule: Secondary | ICD-10-CM | POA: Diagnosis not present

## 2019-12-19 DIAGNOSIS — E785 Hyperlipidemia, unspecified: Secondary | ICD-10-CM | POA: Diagnosis not present

## 2020-01-02 ENCOUNTER — Telehealth: Payer: Self-pay

## 2020-01-02 ENCOUNTER — Other Ambulatory Visit: Payer: Self-pay

## 2020-01-02 MED ORDER — LANTUS SOLOSTAR 100 UNIT/ML ~~LOC~~ SOPN: 40.0000 [IU] | PEN_INJECTOR | Freq: Every day | SUBCUTANEOUS | 0 refills | Status: DC

## 2020-01-02 MED ORDER — AMLODIPINE BESYLATE 10 MG PO TABS: ORAL_TABLET | ORAL | 0 refills | Status: DC

## 2020-01-02 NOTE — Telephone Encounter (Signed)
Pt needing refill on BP medication(amlodipine)  and insulin glargine. Pt is out of insulin and only has 2 of the BP medication.

## 2020-01-02 NOTE — Telephone Encounter (Signed)
Medications refilled

## 2020-01-03 ENCOUNTER — Ambulatory Visit: Payer: Medicare Other | Admitting: Family Medicine

## 2020-01-05 ENCOUNTER — Other Ambulatory Visit: Payer: Self-pay | Admitting: Family Medicine

## 2020-01-07 ENCOUNTER — Other Ambulatory Visit: Payer: Self-pay | Admitting: Family Medicine

## 2020-01-07 DIAGNOSIS — E1159 Type 2 diabetes mellitus with other circulatory complications: Secondary | ICD-10-CM

## 2020-01-08 ENCOUNTER — Other Ambulatory Visit: Payer: Self-pay | Admitting: Family Medicine

## 2020-01-19 ENCOUNTER — Other Ambulatory Visit: Payer: Self-pay | Admitting: Internal Medicine

## 2020-01-19 DIAGNOSIS — R911 Solitary pulmonary nodule: Secondary | ICD-10-CM

## 2020-01-19 DIAGNOSIS — I2699 Other pulmonary embolism without acute cor pulmonale: Secondary | ICD-10-CM

## 2020-01-21 ENCOUNTER — Ambulatory Visit: Payer: Medicare Other | Admitting: Family Medicine

## 2020-01-24 ENCOUNTER — Ambulatory Visit: Payer: Medicare Other | Admitting: Family Medicine

## 2020-01-29 ENCOUNTER — Ambulatory Visit: Payer: Medicare Other | Admitting: Gastroenterology

## 2020-01-29 ENCOUNTER — Encounter: Payer: Self-pay | Admitting: Internal Medicine

## 2020-01-29 ENCOUNTER — Other Ambulatory Visit: Payer: Self-pay | Admitting: Family Medicine

## 2020-01-29 DIAGNOSIS — E1159 Type 2 diabetes mellitus with other circulatory complications: Secondary | ICD-10-CM

## 2020-01-30 ENCOUNTER — Ambulatory Visit (INDEPENDENT_AMBULATORY_CARE_PROVIDER_SITE_OTHER): Payer: Medicare Other | Admitting: Family Medicine

## 2020-01-30 ENCOUNTER — Encounter: Payer: Self-pay | Admitting: Family Medicine

## 2020-01-30 ENCOUNTER — Other Ambulatory Visit: Payer: Self-pay

## 2020-01-30 VITALS — BP 146/74 | HR 70 | Resp 16 | Ht 66.0 in | Wt 235.0 lb

## 2020-01-30 DIAGNOSIS — Z23 Encounter for immunization: Secondary | ICD-10-CM | POA: Diagnosis not present

## 2020-01-30 DIAGNOSIS — R9389 Abnormal findings on diagnostic imaging of other specified body structures: Secondary | ICD-10-CM

## 2020-01-30 DIAGNOSIS — I10 Essential (primary) hypertension: Secondary | ICD-10-CM | POA: Diagnosis not present

## 2020-01-30 DIAGNOSIS — E1159 Type 2 diabetes mellitus with other circulatory complications: Secondary | ICD-10-CM | POA: Diagnosis not present

## 2020-01-30 DIAGNOSIS — E785 Hyperlipidemia, unspecified: Secondary | ICD-10-CM | POA: Diagnosis not present

## 2020-01-30 DIAGNOSIS — K219 Gastro-esophageal reflux disease without esophagitis: Secondary | ICD-10-CM

## 2020-01-30 LAB — POCT GLYCOSYLATED HEMOGLOBIN (HGB A1C): Hemoglobin A1C: 7.3 % — AB (ref 4.0–5.6)

## 2020-01-30 MED ORDER — LISINOPRIL 20 MG PO TABS: 20.0000 mg | ORAL_TABLET | Freq: Every day | ORAL | 5 refills | Status: DC

## 2020-01-30 NOTE — Patient Instructions (Signed)
Annual physical exam in office and re eval blood pressure with mD, 10/28 or after, call if you need me sooner  PLEASE BRING ALL MEDICATIONS TO YOUR NEXT VISIT  GlycohB in office today  Flu vaccine today  NEW hIgher dose of lisinopril as blood pressure is still high, start lisinopril 20 mg daily  We will put you on a list for eye exam in the office , this will not be before November  We will follow up on stool test with your insurance , you state this was done around June  Nurse please verify and enter the statin she is filling, pravavstatin and crestor are both listed, needs only one, and correct/ update med list, thank you  Thanks for choosing [facility], we consider it a privelige to serve you.

## 2020-01-30 NOTE — Assessment & Plan Note (Signed)
Uncontrolled, increase dose of lisinopril DASH diet and commitment to daily physical activity for a minimum of 30 minutes discussed and encouraged, as a part of hypertension management. The importance of attaining a healthy weight is also discussed.  BP/Weight 01/30/2020 12/03/2019 11/27/2019 11/22/2019 10/28/2019 09/03/2019 3/54/6568  Systolic BP 127 517 001 749 [phone]  Diastolic BP 74 64 64 80 68 80 70  Wt. (Lbs) 235 220 220 227 222 233.4 232  BMI 37.93 35.51 35.51 36.64 35.83 37.67 37.45

## 2020-01-30 NOTE — Progress Notes (Signed)
Kathleen [name]     MRN: [id number]      DOB: 1950/06/03   HPI [name] is here for follow up and re-evaluation of chronic medical conditions, medication management and review of any available recent lab and radiology data.  Preventive health is updated, specifically  Cancer screening and Immunization.   Questions or concerns regarding consultations or procedures which the PT has had in the interim are  Addressed.Is being followed by pulmonary  Following recent hospitalization with abnormal chest scan The PT denies any adverse reactions to current medications since the last visit.  C/o shortness of breath with activity Denies polyuria, polydipsia, blurred vision , or hypoglycemic episodes.   ROS Denies recent fever or chills. Denies sinus pressure, nasal congestion, ear pain or sore throat. Denies chest congestion, productive cough or wheezing. Denies chest pains, palpitations and leg swelling Denies abdominal pain, nausea, vomiting,diarrhea or constipation.   Denies dysuria, frequency, hesitancy or incontinence. C/o  limitation in mobility.Uses a cane for safe ambulation Denies headaches, seizures, numbness, or tingling. Denies depression, anxiety or insomnia. Denies skin break down or rash.   PE  BP (!) 146/74   Pulse 70   Resp 16   Ht 5\' 6"  (1.676 m)   Wt 235 lb (106.6 kg)   SpO2 95%   BMI 37.93 kg/m   Patient alert and oriented and in no cardiopulmonary distress.  HEENT: No facial asymmetry, EOMI,     Neck supple .  Chest: Clear to auscultation bilaterally.  CVS: S1, S2 no murmurs, no S3.Regular rate.  ABD: Soft non tender.   Ext: No edema  MS: decreased  ROM spine, shoulders, hips and knees.  Skin: Intact, no ulcerations or rash noted.  Psych: Good eye contact, normal affect. Memory intact not anxious or depressed appearing.  CNS: CN 2-12 intact, power,  normal throughout.no focal deficits noted.   Assessment & Plan  Essential  hypertension Uncontrolled, increase dose of lisinopril DASH diet and commitment to daily physical activity for a minimum of 30 minutes discussed and encouraged, as a part of hypertension management. The importance of attaining a healthy weight is also discussed.  BP/Weight 01/30/2020 12/03/2019 11/27/2019 11/22/2019 10/28/2019 09/03/2019 08/16/8117  Systolic BP 147 829 562 130 865 784 696  Diastolic BP 74 64 64 80 68 80 70  Wt. (Lbs) 235 220 220 227 222 233.4 232  BMI 37.93 35.51 35.51 36.64 35.83 37.67 37.45       Morbid obesity (HCC) Obesity linked with hypertension and diabetes  Patient re-educated about  the importance of commitment to a  minimum of 150 minutes of exercise per week as able.  The importance of healthy food choices with portion control discussed, as well as eating regularly and within a 12 hour window most days. The need to choose "clean , green" food 50 to 75% of the time is discussed, as well as to make water the primary drink and set a goal of 64 ounces water daily.    Weight /BMI 01/30/2020 12/03/2019 11/27/2019  WEIGHT 235 lb 220 lb 220 lb  HEIGHT 5\' 6"  5\' 6"  5\' 6"   BMI 37.93 kg/m2 35.51 kg/m2 35.51 kg/m2      Hyperlipidemia LDL goal <100 Hyperlipidemia:Low fat diet discussed and encouraged.   Lipid Panel  Lab Results  Component Value Date   CHOL 157 05/24/2019   HDL 36 (L) 05/24/2019   LDLCALC 102 (H) 05/24/2019   TRIG 95 05/24/2019   CHOLHDL 4.4 05/24/2019  Updated lab needed at/ before next visit. Not at goal  Type 2 diabetes mellitus with vascular disease ([place]) Improved [name] is reminded of the importance of commitment to daily physical activity for 30 minutes or more, as able and the need to limit carbohydrate intake to 30 to 60 grams per meal to help with blood sugar control.   The need to take medication as prescribed, test blood sugar as directed, and to call between visits if there is a concern that blood sugar is uncontrolled is  also discussed.   [name] is reminded of the importance of daily foot exam, annual eye examination, and good blood sugar, blood pressure and cholesterol control.  Diabetic Labs Latest Ref Rng & Units 01/30/2020 10/27/2019 10/25/2019 10/24/2019 10/23/2019  HbA1c 4.0 - 5.6 % 7.3(A) - - - -  Microalbumin mg/dL - - - - -  Micro/Creat Ratio 0.0 - 30.0 mg/g creat - - - - -  Chol <200 mg/dL - - - - -  HDL > OR = 50 mg/dL - - - - -  Calc LDL mg/dL (calc) - - - - -  Triglycerides <150 mg/dL - - - - -  Creatinine 0.44 - 1.00 mg/dL - 1.75(H) 1.66(H) 1.67(H) 1.56(H)   BP/Weight 01/30/2020 12/03/2019 11/27/2019 11/22/2019 10/28/2019 09/03/2019 2/50/0370  Systolic BP 488 891 694 503 888 280 034  Diastolic BP 74 64 64 80 68 80 70  Wt. (Lbs) 235 220 220 227 222 233.4 232  BMI 37.93 35.51 35.51 36.64 35.83 37.67 37.45   Foot/eye exam completion dates Latest Ref Rng & Units 03/06/2019 06/22/2018  Eye Exam No Retinopathy - -  Foot Form Completion - Done Done      No med change  GERD Controlled, no change in medication   Abnormal CT scan, chest Being evaluated by Pulmonary, has rept scan upcomoing, no longer oxygen depenedent

## 2020-01-30 NOTE — Assessment & Plan Note (Signed)
Obesity linked with hypertension and diabetes  Patient re-educated about  the importance of commitment to a  minimum of 150 minutes of exercise per week as able.  The importance of healthy food choices with portion control discussed, as well as eating regularly and within a 12 hour window most days. The need to choose "clean , green" food 50 to 75% of the time is discussed, as well as to make water the primary drink and set a goal of 64 ounces water daily.    Weight /BMI 01/30/2020 12/03/2019 11/27/2019  WEIGHT 235 lb 220 lb 220 lb  HEIGHT 5\' 6"  5\' 6"  5\' 6"   BMI 37.93 kg/m2 35.51 kg/m2 35.51 kg/m2

## 2020-01-30 NOTE — Assessment & Plan Note (Signed)
Being evaluated by Pulmonary, has rept scan upcomoing, no longer oxygen depenedent

## 2020-01-30 NOTE — Assessment & Plan Note (Signed)
Hyperlipidemia:Low fat diet discussed and encouraged.   Lipid Panel  Lab Results  Component Value Date   CHOL 157 05/24/2019   HDL 36 (L) 05/24/2019   LDLCALC 102 (H) 05/24/2019   TRIG 95 05/24/2019   CHOLHDL 4.4 05/24/2019     Updated lab needed at/ before next visit. Not at goal

## 2020-01-30 NOTE — Assessment & Plan Note (Signed)
Controlled, no change in medication  

## 2020-01-30 NOTE — Assessment & Plan Note (Signed)
Improved Kathleen [name] is reminded of the importance of commitment to daily physical activity for 30 minutes or more, as able and the need to limit carbohydrate intake to 30 to 60 grams per meal to help with blood sugar control.   The need to take medication as prescribed, test blood sugar as directed, and to call between visits if there is a concern that blood sugar is uncontrolled is also discussed.   Kathleen [name] is reminded of the importance of daily foot exam, annual eye examination, and good blood sugar, blood pressure and cholesterol control.  Diabetic Labs Latest Ref Rng & Units 01/30/2020 10/27/2019 10/25/2019 10/24/2019 10/23/2019  HbA1c 4.0 - 5.6 % 7.3(A) - - - -  Microalbumin mg/dL - - - - -  Micro/Creat Ratio 0.0 - 30.0 mg/g creat - - - - -  Chol <200 mg/dL - - - - -  HDL > OR = 50 mg/dL - - - - -  Calc LDL mg/dL (calc) - - - - -  Triglycerides <150 mg/dL - - - - -  Creatinine 0.44 - 1.00 mg/dL - 1.75(H) 1.66(H) 1.67(H) 1.56(H)   BP/Weight 01/30/2020 12/03/2019 11/27/2019 11/22/2019 10/28/2019 09/03/2019 08/21/2393  Systolic BP 320 233 435 686 168 372 902  Diastolic BP 74 64 64 80 68 80 70  Wt. (Lbs) 235 220 220 227 222 233.4 232  BMI 37.93 35.51 35.51 36.64 35.83 37.67 37.45   Foot/eye exam completion dates Latest Ref Rng & Units 03/06/2019 06/22/2018  Eye Exam No Retinopathy - -  Foot Form Completion - Done Done      No med change

## 2020-01-31 ENCOUNTER — Other Ambulatory Visit: Payer: Self-pay

## 2020-01-31 ENCOUNTER — Ambulatory Visit (HOSPITAL_COMMUNITY)
Admission: RE | Admit: 2020-01-31 | Discharge: 2020-01-31 | Disposition: A | Discharge status: Home / Self Care | Payer: Medicare Other | Source: Ambulatory Visit | Attending: Internal Medicine | Admitting: Internal Medicine

## 2020-01-31 DIAGNOSIS — J8410 Pulmonary fibrosis, unspecified: Secondary | ICD-10-CM | POA: Diagnosis not present

## 2020-01-31 DIAGNOSIS — M5134 Other intervertebral disc degeneration, thoracic region: Secondary | ICD-10-CM | POA: Diagnosis not present

## 2020-01-31 DIAGNOSIS — R911 Solitary pulmonary nodule: Secondary | ICD-10-CM

## 2020-01-31 DIAGNOSIS — I700 Atherosclerosis of aorta: Secondary | ICD-10-CM | POA: Diagnosis not present

## 2020-01-31 MED ORDER — LANTUS SOLOSTAR 100 UNIT/ML ~~LOC~~ SOPN: 40.0000 [IU] | PEN_INJECTOR | Freq: Every day | SUBCUTANEOUS | 6 refills | Status: DC

## 2020-02-01 ENCOUNTER — Ambulatory Visit (HOSPITAL_COMMUNITY)
Admission: RE | Admit: 2020-02-01 | Discharge: 2020-02-01 | Disposition: A | Discharge status: Home / Self Care | Payer: Medicare Other | Source: Ambulatory Visit | Attending: Internal Medicine | Admitting: Internal Medicine

## 2020-02-01 ENCOUNTER — Other Ambulatory Visit: Payer: Self-pay

## 2020-02-01 DIAGNOSIS — I10 Essential (primary) hypertension: Secondary | ICD-10-CM | POA: Diagnosis not present

## 2020-02-01 DIAGNOSIS — E785 Hyperlipidemia, unspecified: Secondary | ICD-10-CM | POA: Insufficient documentation

## 2020-02-01 DIAGNOSIS — I519 Heart disease, unspecified: Secondary | ICD-10-CM

## 2020-02-01 DIAGNOSIS — I2699 Other pulmonary embolism without acute cor pulmonale: Secondary | ICD-10-CM

## 2020-02-01 DIAGNOSIS — E119 Type 2 diabetes mellitus without complications: Secondary | ICD-10-CM | POA: Insufficient documentation

## 2020-02-01 LAB — ECHOCARDIOGRAM COMPLETE
AR max vel: 2.15 cm2
AV Area VTI: 2.54 cm2
AV Area mean vel: 2.08 cm2
AV Mean grad: 4.8 mmHg
AV Peak grad: 11.3 mmHg
Ao pk vel: 1.68 m/s
Area-P 1/2: 2.38 cm2
S' Lateral: 2.57 cm

## 2020-02-01 NOTE — Progress Notes (Signed)
*  PRELIMINARY RESULTS* Echocardiogram 2D Echocardiogram has been performed.  Leavy Cella 02/01/2020, 3:01 PM

## 2020-02-01 NOTE — Progress Notes (Signed)
Spoke with pt and notified of results per Dr. Wert. Pt verbalized understanding and denied any questions. 

## 2020-02-05 ENCOUNTER — Encounter: Payer: Self-pay | Admitting: Family Medicine

## 2020-02-05 ENCOUNTER — Other Ambulatory Visit: Payer: Self-pay

## 2020-02-05 ENCOUNTER — Ambulatory Visit (INDEPENDENT_AMBULATORY_CARE_PROVIDER_SITE_OTHER): Payer: Medicare Other | Admitting: Family Medicine

## 2020-02-05 VITALS — BP 127/75 | HR 89 | Resp 16 | Ht 66.0 in | Wt 231.0 lb

## 2020-02-05 DIAGNOSIS — E1159 Type 2 diabetes mellitus with other circulatory complications: Secondary | ICD-10-CM

## 2020-02-05 DIAGNOSIS — I10 Essential (primary) hypertension: Secondary | ICD-10-CM

## 2020-02-05 LAB — POCT GLYCOSYLATED HEMOGLOBIN (HGB A1C): Hemoglobin A1C: 7.5 % — AB (ref 4.0–5.6)

## 2020-02-05 MED ORDER — INSULIN GLARGINE 100 UNIT/ML SOLOSTAR PEN: 60.0000 [IU] | PEN_INJECTOR | Freq: Every day | SUBCUTANEOUS | 11 refills | Status: DC

## 2020-02-05 NOTE — Patient Instructions (Signed)
Please keep appointment  For physical exam  Increase insulin dose back to 60 units daily, this is the correct dose for you   We will send for cologuard result  From 09/13/2019, copy of the information to be given to the nurse  It is important that you exercise regularly at least 30 minutes 5 times a week. If you develop chest pain, have severe difficulty breathing, or feel very tired, stop exercising immediately and seek medical attention   Think about what you will eat, plan ahead. Choose " clean, green, fresh or frozen" over canned, processed or packaged foods which are more sugary, salty and fatty. 70 to 75% of food eaten should be vegetables and fruit. Three meals at set times with snacks allowed between meals, but they must be fruit or vegetables. Aim to eat over a 12 hour period , example 7 am to 7 pm, and STOP after  your last meal of the day. Drink water,generally about 64 ounces per day, no other drink is as healthy. Fruit juice is best enjoyed in a healthy way, by EATING the fruit.  Thanks for choosing [facility], we consider it a privelige to serve you.

## 2020-02-05 NOTE — Progress Notes (Signed)
Kathleen [name]     MRN: [id number]      DOB: 1950-06-04   HPI Kathleen [name] due to uncontrolled blood sugar over 200 when she followed direction ont he bottle for 40 units lantus daily, had been using 60 units with great control. In the office to have the matter resolved No other concerns ROS Denies recent fever or chills. Denies sinus pressure, nasal congestion, ear pain or sore throat. Denies chest congestion, productive cough or wheezing. Denies chest pains, palpitations and leg swelling Denies skin break down or rash.   PE  BP 127/75    Pulse 89    Resp 16    Ht 5\' 6"  (1.676 m)    Wt 231 lb 0.6 oz (104.8 kg)    SpO2 95%    BMI 37.29 kg/m   Patient alert and oriented and in no cardiopulmonary distress.  HEENT: No facial asymmetry, EOMI,     Neck supple .  Chest: Clear to auscultation bilaterally.  CVS: S1, S2 no murmurs, no S3.Regular rate.  Ext: No edema   Psych: Good eye contact, normal affect. Memory intact not anxious or depressed appearing.  CNS: CN 2-12 intact, power,  normal throughout.no focal deficits noted.   Assessment & Plan Type 2 diabetes mellitus with vascular disease (HCC) Controlled, no change in medication New prescription sent with correct dose of lantus 60 units daily Kathleen [name] is reminded of the importance of commitment to daily physical activity for 30 minutes or more, as able and the need to limit carbohydrate intake to 30 to 60 grams per meal to help with blood sugar control.   The need to take medication as prescribed, test blood sugar as directed, and to call between visits if there is a concern that blood sugar is uncontrolled is also discussed.   Kathleen [name] is reminded of the importance of daily foot exam, annual eye examination, and good blood sugar, blood pressure and cholesterol control.  Diabetic Labs Latest Ref Rng & Units 02/05/2020 01/30/2020 10/27/2019 10/25/2019 10/24/2019  HbA1c 4.0 - 5.6 % 7.5(A) 7.3(A) - - -    Microalbumin mg/dL - - - - -  Micro/Creat Ratio 0.0 - 30.0 mg/g creat - - - - -  Chol <200 mg/dL - - - - -  HDL > OR = 50 mg/dL - - - - -  Calc LDL mg/dL (calc) - - - - -  Triglycerides <150 mg/dL - - - - -  Creatinine 0.44 - 1.00 mg/dL - - 1.75(H) 1.66(H) 1.67(H)   BP/Weight 02/05/2020 01/30/2020 12/03/2019 11/27/2019 11/22/2019 10/28/2019 4/40/1027  Systolic BP 253 664 403 474 259 563 875  Diastolic BP 75 74 64 64 80 68 80  Wt. (Lbs) 231.04 235 220 220 227 222 233.4  BMI 37.29 37.93 35.51 35.51 36.64 35.83 37.67   Foot/eye exam completion dates Latest Ref Rng & Units 03/06/2019 06/22/2018  Eye Exam No Retinopathy - -  Foot Form Completion - Done Done        Essential hypertension Controlled, no change in medication DASH diet and commitment to daily physical activity for a minimum of 30 minutes discussed and encouraged, as a part of hypertension management. The importance of attaining a healthy weight is also discussed.  BP/Weight 02/05/2020 01/30/2020 12/03/2019 11/27/2019 11/22/2019 10/28/2019 6/43/3295  Systolic BP 188 416 606 301 601 093 235  Diastolic BP 75 74 64 64 80 68 80  Wt. (Lbs) 231.04 235 220 220 227 222 233.4  BMI 37.29 37.93 35.51 35.51 36.64 35.83 37.67       Morbid obesity (HCC) obsity linked with hypertension and diabetes  Patient re-educated about  the importance of commitment to a  minimum of 150 minutes of exercise per week as able.  The importance of healthy food choices with portion control discussed, as well as eating regularly and within a 12 hour window most days. The need to choose "clean , green" food 50 to 75% of the time is discussed, as well as to make water the primary drink and set a goal of 64 ounces water daily.    Weight /BMI 02/05/2020 01/30/2020 12/03/2019  WEIGHT 231 lb 0.6 oz 235 lb 220 lb  HEIGHT 5\' 6"  5\' 6"  5\' 6"   BMI 37.29 kg/m2 37.93 kg/m2 35.51 kg/m2

## 2020-02-06 ENCOUNTER — Other Ambulatory Visit: Payer: Self-pay | Admitting: Nephrology

## 2020-02-06 DIAGNOSIS — E1122 Type 2 diabetes mellitus with diabetic chronic kidney disease: Secondary | ICD-10-CM

## 2020-02-06 DIAGNOSIS — N189 Chronic kidney disease, unspecified: Secondary | ICD-10-CM | POA: Diagnosis not present

## 2020-02-06 DIAGNOSIS — D638 Anemia in other chronic diseases classified elsewhere: Secondary | ICD-10-CM | POA: Diagnosis not present

## 2020-02-06 DIAGNOSIS — E559 Vitamin D deficiency, unspecified: Secondary | ICD-10-CM

## 2020-02-06 DIAGNOSIS — I129 Hypertensive chronic kidney disease with stage 1 through stage 4 chronic kidney disease, or unspecified chronic kidney disease: Secondary | ICD-10-CM | POA: Diagnosis not present

## 2020-02-06 DIAGNOSIS — E871 Hypo-osmolality and hyponatremia: Secondary | ICD-10-CM | POA: Diagnosis not present

## 2020-02-06 DIAGNOSIS — I10 Essential (primary) hypertension: Secondary | ICD-10-CM

## 2020-02-07 NOTE — Progress Notes (Signed)
Spoke with the pt, notified of results and scheduled televisit for 02/08/20.

## 2020-02-09 ENCOUNTER — Encounter: Payer: Self-pay | Admitting: Family Medicine

## 2020-02-09 NOTE — Assessment & Plan Note (Signed)
obsity linked with hypertension and diabetes  Patient re-educated about  the importance of commitment to a  minimum of 150 minutes of exercise per week as able.  The importance of healthy food choices with portion control discussed, as well as eating regularly and within a 12 hour window most days. The need to choose "clean , green" food 50 to 75% of the time is discussed, as well as to make water the primary drink and set a goal of 64 ounces water daily.    Weight /BMI 02/05/2020 01/30/2020 12/03/2019  WEIGHT 231 lb 0.6 oz 235 lb 220 lb  HEIGHT 5\' 6"  5\' 6"  5\' 6"   BMI 37.29 kg/m2 37.93 kg/m2 35.51 kg/m2

## 2020-02-09 NOTE — Assessment & Plan Note (Signed)
Controlled, no change in medication New prescription sent with correct dose of lantus 60 units daily Kathleen [name] is reminded of the importance of commitment to daily physical activity for 30 minutes or more, as able and the need to limit carbohydrate intake to 30 to 60 grams per meal to help with blood sugar control.   The need to take medication as prescribed, test blood sugar as directed, and to call between visits if there is a concern that blood sugar is uncontrolled is also discussed.   Kathleen [name] is reminded of the importance of daily foot exam, annual eye examination, and good blood sugar, blood pressure and cholesterol control.  Diabetic Labs Latest Ref Rng & Units 02/05/2020 01/30/2020 10/27/2019 10/25/2019 10/24/2019  HbA1c 4.0 - 5.6 % 7.5(A) 7.3(A) - - -  Microalbumin mg/dL - - - - -  Micro/Creat Ratio 0.0 - 30.0 mg/g creat - - - - -  Chol <200 mg/dL - - - - -  HDL > OR = 50 mg/dL - - - - -  Calc LDL mg/dL (calc) - - - - -  Triglycerides <150 mg/dL - - - - -  Creatinine 0.44 - 1.00 mg/dL - - 1.75(H) 1.66(H) 1.67(H)   BP/Weight 02/05/2020 01/30/2020 12/03/2019 11/27/2019 11/22/2019 10/28/2019 4/69/6295  Systolic BP 284 132 440 102 725 366 440  Diastolic BP 75 74 64 64 80 68 80  Wt. (Lbs) 231.04 235 220 220 227 222 233.4  BMI 37.29 37.93 35.51 35.51 36.64 35.83 37.67   Foot/eye exam completion dates Latest Ref Rng & Units 03/06/2019 06/22/2018  Eye Exam No Retinopathy - -  Foot Form Completion - Done Done

## 2020-02-09 NOTE — Assessment & Plan Note (Signed)
Controlled, no change in medication DASH diet and commitment to daily physical activity for a minimum of 30 minutes discussed and encouraged, as a part of hypertension management. The importance of attaining a healthy weight is also discussed.  BP/Weight 02/05/2020 01/30/2020 12/03/2019 11/27/2019 11/22/2019 10/28/2019 10/20/2447  Systolic BP 753 005 110 211 173 567 014  Diastolic BP 75 74 64 64 80 68 80  Wt. (Lbs) 231.04 235 220 220 227 222 233.4  BMI 37.29 37.93 35.51 35.51 36.64 35.83 37.67

## 2020-02-14 ENCOUNTER — Other Ambulatory Visit: Payer: Self-pay

## 2020-02-14 ENCOUNTER — Ambulatory Visit (HOSPITAL_COMMUNITY)
Admission: RE | Admit: 2020-02-14 | Discharge: 2020-02-14 | Disposition: A | Discharge status: Home / Self Care | Payer: Medicare Other | Source: Ambulatory Visit | Attending: Nephrology | Admitting: Nephrology

## 2020-02-14 DIAGNOSIS — N289 Disorder of kidney and ureter, unspecified: Secondary | ICD-10-CM | POA: Diagnosis not present

## 2020-02-14 DIAGNOSIS — E559 Vitamin D deficiency, unspecified: Secondary | ICD-10-CM

## 2020-02-14 DIAGNOSIS — I10 Essential (primary) hypertension: Secondary | ICD-10-CM | POA: Diagnosis not present

## 2020-02-14 DIAGNOSIS — E1122 Type 2 diabetes mellitus with diabetic chronic kidney disease: Secondary | ICD-10-CM | POA: Diagnosis not present

## 2020-02-14 DIAGNOSIS — D638 Anemia in other chronic diseases classified elsewhere: Secondary | ICD-10-CM

## 2020-02-14 DIAGNOSIS — N281 Cyst of kidney, acquired: Secondary | ICD-10-CM | POA: Insufficient documentation

## 2020-02-19 DIAGNOSIS — Z79899 Other long term (current) drug therapy: Secondary | ICD-10-CM | POA: Diagnosis not present

## 2020-02-19 DIAGNOSIS — I5032 Chronic diastolic (congestive) heart failure: Secondary | ICD-10-CM | POA: Diagnosis not present

## 2020-02-19 DIAGNOSIS — E559 Vitamin D deficiency, unspecified: Secondary | ICD-10-CM | POA: Diagnosis not present

## 2020-02-19 DIAGNOSIS — N189 Chronic kidney disease, unspecified: Secondary | ICD-10-CM | POA: Diagnosis not present

## 2020-02-19 DIAGNOSIS — E1122 Type 2 diabetes mellitus with diabetic chronic kidney disease: Secondary | ICD-10-CM | POA: Diagnosis not present

## 2020-02-19 DIAGNOSIS — Z1159 Encounter for screening for other viral diseases: Secondary | ICD-10-CM | POA: Diagnosis not present

## 2020-02-19 DIAGNOSIS — R809 Proteinuria, unspecified: Secondary | ICD-10-CM | POA: Diagnosis not present

## 2020-02-28 ENCOUNTER — Other Ambulatory Visit: Payer: Self-pay | Admitting: Family Medicine

## 2020-02-28 DIAGNOSIS — E785 Hyperlipidemia, unspecified: Secondary | ICD-10-CM

## 2020-03-05 ENCOUNTER — Telehealth: Payer: Self-pay

## 2020-03-07 DIAGNOSIS — D472 Monoclonal gammopathy: Secondary | ICD-10-CM | POA: Diagnosis not present

## 2020-03-07 DIAGNOSIS — D638 Anemia in other chronic diseases classified elsewhere: Secondary | ICD-10-CM | POA: Diagnosis not present

## 2020-03-07 DIAGNOSIS — I129 Hypertensive chronic kidney disease with stage 1 through stage 4 chronic kidney disease, or unspecified chronic kidney disease: Secondary | ICD-10-CM | POA: Diagnosis not present

## 2020-03-07 DIAGNOSIS — E871 Hypo-osmolality and hyponatremia: Secondary | ICD-10-CM | POA: Diagnosis not present

## 2020-03-07 DIAGNOSIS — N189 Chronic kidney disease, unspecified: Secondary | ICD-10-CM | POA: Diagnosis not present

## 2020-03-12 ENCOUNTER — Encounter: Payer: Medicare Other | Admitting: Family Medicine

## 2020-03-18 ENCOUNTER — Telehealth: Payer: Self-pay | Admitting: Internal Medicine

## 2020-03-18 NOTE — Telephone Encounter (Signed)
Spoke with pt, aware of recs.  States that she is not currently bleeding so she does not wish to go to ED at this time.  I asked if she needed GI referral and she declined.  I again stressed the importance of presenting to ED if she is still bleeding, and pt expressed understanding.  Also advised pt that Magda Paganini spoke to pharmacy, they advised pt to bring Eliquis starter pack to pharmacy and they will switch out to the correct rx.  Pt expressed understanding.  Nothing further needed at this time- will close encounter.

## 2020-03-18 NOTE — Telephone Encounter (Signed)
Spoke with the pt  She states started having bright red stools 2 days ago  She is supposed to be on Eliquis 5 mg 1 bid  She says when she picked up the refill on 11/1 the bottle said to take 5 mg 2 bid and so this is how she has been taking it  [employer] and spoke with April  She states rx for starter pack was accidentally filled instead on 11/1  She will get another rx for the 5 mg bid dose ready for the pt and she can bring back the other to them  Dr Melvyn Novas- do you have any recommendations? Please advise, thanks!

## 2020-03-18 NOTE — Telephone Encounter (Signed)
Even the double dose should not cause gib so will need to go to ER to evaluate if it is ongoing or she's having any symptoms of lightheadedness or standing  or set up with her GI doc if it has stopped and she feels fine now.  Should hold any futher dosing of eliquis  in any case in the event ongoing (fresh/ bright red) bleeding then resume 5 mg bid when no more bleeding at all  x 24 hours minimum.

## 2020-03-19 ENCOUNTER — Ambulatory Visit (INDEPENDENT_AMBULATORY_CARE_PROVIDER_SITE_OTHER): Payer: Medicare Other | Admitting: Internal Medicine

## 2020-03-19 ENCOUNTER — Other Ambulatory Visit: Payer: Self-pay

## 2020-03-19 ENCOUNTER — Other Ambulatory Visit: Payer: Self-pay | Admitting: Family Medicine

## 2020-03-19 ENCOUNTER — Encounter: Payer: Self-pay | Admitting: Internal Medicine

## 2020-03-19 VITALS — BP 130/69 | HR 86 | Temp 99.3°F | Resp 20 | Ht 66.0 in | Wt 225.0 lb

## 2020-03-19 DIAGNOSIS — I10 Essential (primary) hypertension: Secondary | ICD-10-CM | POA: Diagnosis not present

## 2020-03-19 DIAGNOSIS — E1159 Type 2 diabetes mellitus with other circulatory complications: Secondary | ICD-10-CM

## 2020-03-19 DIAGNOSIS — Z0000 Encounter for general adult medical examination without abnormal findings: Secondary | ICD-10-CM | POA: Diagnosis not present

## 2020-03-19 NOTE — Assessment & Plan Note (Addendum)
BP Readings from Last 1 Encounters:  03/19/20 130/69   controlled Counseled for compliance with the medications Advised DASH diet and moderate exercise/walking, at least 150 mins/week

## 2020-03-19 NOTE — Assessment & Plan Note (Signed)
Annual exam as documented. Counseling done  re healthy lifestyle involving commitment to 150 minutes exercise per week, heart healthy diet, and attaining healthy weight.The importance of adequate sleep also discussed. Changes in health habits are decided on by the patient with goals and time frames  set for achieving them. Immunization and cancer screening needs are specifically addressed at this visit. 

## 2020-03-19 NOTE — Assessment & Plan Note (Signed)
HbA1C: 7.5 (01/2020) On Lantus Advised to follow diabetic diet On ACEi Diabetic eye exam: Advised to follow up with Ophthalmology for diabetic eye exam

## 2020-03-19 NOTE — Patient Instructions (Addendum)
Please continue to take medications as prescribed.  Please follow low sodium diet and try to walk at least 150 mins/week if possible.  Please check your blood pressure at home and bring the log in the next visit.  Please continue to follow up with your Nephrologist.  Please schedule visit with Ophthalmologist for annual eye exam.

## 2020-03-19 NOTE — Progress Notes (Signed)
Established Patient Office Visit  Subjective:  Patient ID: Kathleen [name], female    DOB: 10-27-1950  Age: 69 y.o. MRN: [id number]  CC:  Chief Complaint  Patient presents with  . Annual Exam    HPI Kathleen [name] is up 69 year old female with past medical history of type II DM, hypertension, OSA, PE, CKD stage IIIb, hyperlipidemia and morbid obesity presents for annual physical exam.  She states that she has been in good health overall.  She denies any active complaints currently.  Her blood pressure was 154/81 today, but on repeat check it was found to be 130/69.  She denies any headache, dizziness, chest pain, dyspnea or palpitations.  She had cologuard done in 10/2019, but has not heard back yet.  Past Medical History:  Diagnosis Date  . Anemia   . Arthritis   . Chronic kidney disease   . Diabetes mellitus type II   . Dysphagia    unspecified   . GERD (gastroesophageal reflux disease)   . Hyperlipidemia 2000  . Hypertension 1995  . Insomnia   . Low back pain   . Obesity   . Sleep apnea in adult 11/25/2019  . Thyroid nodule 11/25/2019    Past Surgical History:  Procedure Laterality Date  . Carpal tunnel release     left   . CHOLECYSTECTOMY  2007  . DIGIT NAIL REMOVAL  08/2011  . KNEE SURGERY  1.20.2012   arthroscopy LEFT knee partial medial meniscectomy  . TENOTOMY     2,3,4  left foot   . toenail removal    . TUBAL LIGATION      Family History  Problem Relation Age of Onset  . Diabetes Mother   . Hypertension Mother        MI  . Heart disease Mother   . Kidney disease Mother 41       dialysis  . Diabetes Sister   . Diabetes Brother   . Diabetes Brother   . Kidney disease Brother   . Dementia Sister   . Diabetes Father     Social History   Socioeconomic History  . Marital status: Divorced    Spouse name: Not on file  . Number of children: 4  . Years of education: Not on file  . Highest education level: 11th grade  Occupational History    . Occupation: Full time Engineer, petroleum   Tobacco Use  . Smoking status: Never Smoker  . Smokeless tobacco: Never Used  Vaping Use  . Vaping Use: Never used  Substance and Sexual Activity  . Alcohol use: No  . Drug use: No  . Sexual activity: Yes  Other Topics Concern  . Not on file  Social History Narrative   Lives with 2nd oldest son   Social Determinants of Health   Financial Resource Strain: Low Risk   . Difficulty of Paying Living Expenses: Not hard at all  Food Insecurity: No Food Insecurity  . Worried About Charity fundraiser in the Last Year: Never true  . Ran Out of Food in the Last Year: Never true  Transportation Needs: No Transportation Needs  . Lack of Transportation (Medical): No  . Lack of Transportation (Non-Medical): No  Physical Activity: Insufficiently Active  . Days of Exercise per Week: 2 days  . Minutes of Exercise per Session: 10 min  Stress: No Stress Concern Present  . Feeling of Stress : Not at all  Social Connections: Moderately Isolated  .  Frequency of Communication with Friends and Family: More than three times a week  . Frequency of Social Gatherings with Friends and Family: More than three times a week  . Attends Religious Services: More than 4 times per year  . Active Member of Clubs or Organizations: No  . Attends Archivist Meetings: Never  . Marital Status: Divorced  Human resources officer Violence:   . Fear of Current or Ex-Partner: Not on file  . Emotionally Abused: Not on file  . Physically Abused: Not on file  . Sexually Abused: Not on file    Outpatient Medications Prior to Visit  Medication Sig Dispense Refill  . ACCU-CHEK GUIDE test strip USE TO TEST THREE TIMES DAILY. 100 strip 0  . acetaminophen (TYLENOL) 325 MG tablet Take 2 tablets (650 mg total) by mouth every 6 (six) hours as needed for mild pain, fever or headache (or Fever >/= 101). 12 tablet 0  . amLODipine (NORVASC) 10 MG tablet TAKE ONE TABLET BY MOUTH ONCE DAILY  FOR BLOOD PRESSURE. 90 tablet 0  . apixaban (ELIQUIS) 5 MG TABS tablet Take 1 tablet (5 mg total) by mouth 2 (two) times daily. 60 tablet 5  . cholecalciferol (VITAMIN D) 1000 UNITS tablet Take 1,000 Units by mouth daily.    . cloNIDine (CATAPRES) 0.3 MG tablet TAKE (1) TABLET BY MOUTH 3 TIMES DAILY. (Patient taking differently: Take 0.3 mg by mouth 3 (three) times daily. ) 270 tablet 0  . fluticasone (FLONASE) 50 MCG/ACT nasal spray Place 2 sprays into both nostrils daily as needed. (Patient taking differently: Place 2 sprays into both nostrils daily as needed for allergies or rhinitis. ) 15 g 1  . gabapentin (NEURONTIN) 100 MG capsule Take 1 capsule (100 mg total) by mouth 2 (two) times daily. 60 capsule 3  . insulin glargine (LANTUS) 100 UNIT/ML Solostar Pen Inject 60 Units into the skin daily. 15 mL 11  . lisinopril (ZESTRIL) 20 MG tablet Take 1 tablet (20 mg total) by mouth daily. 30 tablet 5  . pantoprazole (PROTONIX) 40 MG tablet Take 1 tablet (40 mg total) by mouth daily. 30 tablet 3  . polyethylene glycol (MIRALAX / GLYCOLAX) 17 g packet Take 17 g by mouth daily. 30 each 1  . pravastatin (PRAVACHOL) 40 MG tablet TAKE ONE TABLET BY MOUTH ONCE DAILY. 30 tablet 4  . senna-docusate (SENOKOT-S) 8.6-50 MG tablet Take 2 tablets by mouth 2 (two) times daily. 120 tablet 2  . spironolactone-hydrochlorothiazide (ALDACTAZIDE) 25-25 MG tablet TAKE ONE TABLET BY MOUTH ONCE DAILY. 90 tablet 0   No facility-administered medications prior to visit.    No Known Allergies  ROS Review of Systems  Constitutional: Negative for chills and fever.  HENT: Negative for congestion, sinus pressure, sinus pain and sore throat.   Eyes: Negative for pain and discharge.  Respiratory: Negative for cough and shortness of breath.   Cardiovascular: Negative for chest pain and palpitations.  Gastrointestinal: Negative for abdominal pain, constipation, diarrhea, nausea and vomiting.  Endocrine: Negative for polydipsia  and polyuria.  Genitourinary: Negative for dysuria and hematuria.  Musculoskeletal: Negative for neck pain and neck stiffness.  Skin: Negative for rash.  Neurological: Negative for dizziness and weakness.  Psychiatric/Behavioral: Negative for agitation and behavioral problems.      Objective:    Physical Exam Vitals reviewed.  Constitutional:      General: She is not in acute distress.    Appearance: She is obese. She is not diaphoretic.  HENT:  Head: Normocephalic and atraumatic.     Nose: Nose normal. No congestion.     Mouth/Throat:     Mouth: Mucous membranes are moist.     Pharynx: No posterior oropharyngeal erythema.  Eyes:     General: No scleral icterus.    Extraocular Movements: Extraocular movements intact.     Pupils: Pupils are equal, round, and reactive to light.  Cardiovascular:     Rate and Rhythm: Normal rate and regular rhythm.     Pulses: Normal pulses.     Heart sounds: Normal heart sounds. No murmur heard.   Pulmonary:     Breath sounds: Normal breath sounds. No wheezing or rales.  Abdominal:     Palpations: Abdomen is soft.     Tenderness: There is no abdominal tenderness.  Musculoskeletal:     Cervical back: Neck supple. No tenderness.     Right lower leg: No edema.     Left lower leg: No edema.  Skin:    General: Skin is warm.     Findings: No rash.  Neurological:     General: No focal deficit present.     Mental Status: She is alert and oriented to person, place, and time.     Sensory: No sensory deficit.     Motor: No weakness.  Psychiatric:        Mood and Affect: Mood normal.        Behavior: Behavior normal.     BP (!) 154/81   Pulse 86   Temp 99.3 F (37.4 C)   Resp 20   Ht 5\' 6"  (1.676 m)   Wt 225 lb (102.1 kg)   SpO2 95%   BMI 36.32 kg/m  Wt Readings from Last 3 Encounters:  03/19/20 225 lb (102.1 kg)  02/05/20 231 lb 0.6 oz (104.8 kg)  01/30/20 235 lb (106.6 kg)     Health Maintenance Due  Topic Date Due  .  TETANUS/TDAP  02/04/2019  . OPHTHALMOLOGY EXAM  06/07/2019  . COLONOSCOPY  12/02/2019  . FOOT EXAM  03/10/2020    There are no preventive care reminders to display for this patient.  Lab Results  Component Value Date   TSH 1.110 10/22/2019   Lab Results  Component Value Date   WBC 8.2 10/28/2019   HGB 10.1 (L) 10/28/2019   HCT 32.6 (L) 10/28/2019   MCV 85.3 10/28/2019   PLT 258 10/28/2019   Lab Results  Component Value Date   NA 130 (L) 10/27/2019   K 5.1 10/27/2019   CO2 26 10/27/2019   GLUCOSE 102 (H) 10/27/2019   BUN 28 (H) 10/27/2019   CREATININE 1.75 (H) 10/27/2019   BILITOT 0.4 05/24/2019   ALKPHOS 94 01/06/2017   AST 13 05/24/2019   ALT 13 05/24/2019   PROT 7.7 05/24/2019   ALBUMIN 3.4 (L) 10/24/2019   CALCIUM 8.9 10/27/2019   ANIONGAP 5 10/27/2019   Lab Results  Component Value Date   CHOL 157 05/24/2019   Lab Results  Component Value Date   HDL 36 (L) 05/24/2019   Lab Results  Component Value Date   LDLCALC 102 (H) 05/24/2019   Lab Results  Component Value Date   TRIG 95 05/24/2019   Lab Results  Component Value Date   CHOLHDL 4.4 05/24/2019   Lab Results  Component Value Date   HGBA1C 7.5 (A) 02/05/2020      Assessment & Plan:   Encounter for annual physical exam Annual exam as documented. Counseling done  re healthy lifestyle involving commitment to 150 minutes exercise per week, heart healthy diet, and attaining healthy weight.The importance of adequate sleep also discussed. Changes in health habits are decided on by the patient with goals and time frames  set for achieving them. Immunization and cancer screening needs are specifically addressed at this visit.  Essential hypertension BP Readings from Last 1 Encounters:  03/19/20 130/69   controlled Counseled for compliance with the medications Advised DASH diet and moderate exercise/walking, at least 150 mins/week   Type 2 diabetes mellitus with vascular disease ([place]) HbA1C:  7.5 (01/2020) On Lantus Advised to follow diabetic diet On ACEi Diabetic eye exam: Advised to follow up with Ophthalmology for diabetic eye exam   Morbid obesity ([place]) Diet modification and walking for at least 150 mins/week    No orders of the defined types were placed in this encounter.   Follow-up: No follow-ups on file.    Lindell Spar, MD

## 2020-03-19 NOTE — Assessment & Plan Note (Signed)
Diet modification and walking for at least 150 mins/week

## 2020-04-01 ENCOUNTER — Other Ambulatory Visit: Payer: Self-pay | Admitting: Family Medicine

## 2020-04-08 ENCOUNTER — Other Ambulatory Visit: Payer: Self-pay | Admitting: Family Medicine

## 2020-04-23 ENCOUNTER — Other Ambulatory Visit: Payer: Self-pay | Admitting: Internal Medicine

## 2020-04-23 DIAGNOSIS — R911 Solitary pulmonary nodule: Secondary | ICD-10-CM

## 2020-04-24 ENCOUNTER — Other Ambulatory Visit: Payer: Self-pay | Admitting: Family Medicine

## 2020-05-01 ENCOUNTER — Ambulatory Visit (HOSPITAL_COMMUNITY)
Admission: RE | Admit: 2020-05-01 | Discharge: 2020-05-01 | Disposition: A | Discharge status: Home / Self Care | Payer: Medicare Other | Source: Ambulatory Visit | Attending: Internal Medicine | Admitting: Internal Medicine

## 2020-05-01 ENCOUNTER — Other Ambulatory Visit: Payer: Self-pay

## 2020-05-01 DIAGNOSIS — R911 Solitary pulmonary nodule: Secondary | ICD-10-CM | POA: Insufficient documentation

## 2020-05-23 ENCOUNTER — Other Ambulatory Visit: Payer: Self-pay | Admitting: Family Medicine

## 2020-05-29 NOTE — Telephone Encounter (Signed)
error 

## 2020-06-03 ENCOUNTER — Other Ambulatory Visit: Payer: Self-pay | Admitting: Internal Medicine

## 2020-06-06 ENCOUNTER — Other Ambulatory Visit: Payer: Self-pay | Admitting: Family Medicine

## 2020-06-10 DIAGNOSIS — D472 Monoclonal gammopathy: Secondary | ICD-10-CM | POA: Diagnosis not present

## 2020-06-10 DIAGNOSIS — E1122 Type 2 diabetes mellitus with diabetic chronic kidney disease: Secondary | ICD-10-CM | POA: Diagnosis not present

## 2020-06-10 DIAGNOSIS — I129 Hypertensive chronic kidney disease with stage 1 through stage 4 chronic kidney disease, or unspecified chronic kidney disease: Secondary | ICD-10-CM | POA: Diagnosis not present

## 2020-06-10 DIAGNOSIS — E871 Hypo-osmolality and hyponatremia: Secondary | ICD-10-CM | POA: Diagnosis not present

## 2020-06-10 DIAGNOSIS — N189 Chronic kidney disease, unspecified: Secondary | ICD-10-CM | POA: Diagnosis not present

## 2020-06-19 ENCOUNTER — Encounter: Payer: Self-pay | Admitting: Family Medicine

## 2020-06-19 ENCOUNTER — Ambulatory Visit: Payer: Medicare Other | Admitting: Family Medicine

## 2020-06-19 ENCOUNTER — Other Ambulatory Visit (HOSPITAL_COMMUNITY): Payer: Self-pay | Admitting: Family Medicine

## 2020-06-19 ENCOUNTER — Other Ambulatory Visit: Payer: Self-pay | Admitting: Family Medicine

## 2020-06-19 ENCOUNTER — Telehealth (INDEPENDENT_AMBULATORY_CARE_PROVIDER_SITE_OTHER): Payer: Medicare Other | Admitting: Family Medicine

## 2020-06-19 ENCOUNTER — Other Ambulatory Visit: Payer: Self-pay

## 2020-06-19 DIAGNOSIS — E785 Hyperlipidemia, unspecified: Secondary | ICD-10-CM | POA: Diagnosis not present

## 2020-06-19 DIAGNOSIS — Z1231 Encounter for screening mammogram for malignant neoplasm of breast: Secondary | ICD-10-CM

## 2020-06-19 DIAGNOSIS — E559 Vitamin D deficiency, unspecified: Secondary | ICD-10-CM | POA: Diagnosis not present

## 2020-06-19 DIAGNOSIS — E1159 Type 2 diabetes mellitus with other circulatory complications: Secondary | ICD-10-CM | POA: Diagnosis not present

## 2020-06-19 DIAGNOSIS — I10 Essential (primary) hypertension: Secondary | ICD-10-CM | POA: Diagnosis not present

## 2020-06-19 NOTE — Patient Instructions (Addendum)
F/U in office first week in September, call if you need me sooner  Please schedule mammogram at checkout  Please get fasting lipid, cmp and EGFr, HBa1C, microalb , and vit D in the next 1 week  It is important that you exercise regularly at least 30 minutes 5 times a week. If you develop chest pain, have severe difficulty breathing, or feel very tired, stop exercising immediately and seek medical attention   Think about what you will eat, plan ahead. Choose " clean, green, fresh or frozen" over canned, processed or packaged foods which are more sugary, salty and fatty. 70 to 75% of food eaten should be vegetables and fruit. Three meals at set times with snacks allowed between meals, but they must be fruit or vegetables. Aim to eat over a 12 hour period , example 7 am to 7 pm, and STOP after  your last meal of the day. Drink water,generally about 64 ounces per day, no other drink is as healthy. Fruit juice is best enjoyed in a healthy way, by EATING the fruit. Thanks for choosing [facility], we consider it a privelige to serve you.

## 2020-06-19 NOTE — Progress Notes (Signed)
Virtual Visit via Telephone Note  I connected with Kathleen [name] on 41/93/79 at  1:40 PM EST by telephone and verified that I am speaking with the correct person using two identifiers.  Location: Patient: home Provider: work   I discussed the limitations, risks, security and privacy concerns of performing an evaluation and management service by telephone and the availability of in person appointments. I also discussed with the patient that there may be a patient responsible charge related to this service. The patient expressed understanding and agreed to proceed.   History of Present Illness: .F/U chronic problems and address any new or current concerns. Review and update medications and allergies. Review recent lab and radiologic data .Labs available after the visit, will need sooner recall due to uncontrolled diabetes Update routine health maintainace. Review an encourage improved health habits to include nutrition, exercise and  sleep .  Denies recent fever or chills. Denies polyuria, polydipsia, blurred vision , or hypoglycemic episodes.  Denies sinus pressure, nasal congestion, ear pain or sore throat. Denies chest congestion, productive cough or wheezing. Denies chest pains, palpitations and leg swelling Denies abdominal pain, nausea, vomiting,diarrhea or constipation.   Denies dysuria, frequency, hesitancy or incontinence. Denies uncontrolled  joint pain, swelling and limitation in mobility. Denies headaches, seizures, numbness, or tingling. Denies depression, anxiety or insomnia. Denies skin break down or rash.       Observations/Objective: There were no vitals taken for this visit. Good communication with no confusion and intact memory. Alert and oriented x 3 No signs of respiratory distress during speech    Assessment and Plan: Type 2 diabetes mellitus with vascular disease ([place]) Kathleen [name] is reminded of the importance of commitment to daily physical  activity for 30 minutes or more, as able and the need to limit carbohydrate intake to 30 to 60 grams per meal to help with blood sugar control.   The need to take medication as prescribed, test blood sugar as directed, and to call between visits if there is a concern that blood sugar is uncontrolled is also discussed.   Kathleen [name] is reminded of the importance of daily foot exam, annual eye examination, and good blood sugar, blood pressure and cholesterol control. Deteriorated and uncontrolled , will be recalled for sooner appt  Diabetic Labs Latest Ref Rng & Units 06/20/2020 02/05/2020 01/30/2020 10/27/2019 10/25/2019  HbA1c 4.8 - 5.6 % 9.4(H) 7.5(A) 7.3(A) - -  Microalbumin mg/dL - - - - -  Micro/Creat Ratio - WILL FOLLOW - - - -  Chol 100 - 199 mg/dL 111 - - - -  HDL >39 mg/dL 30(L) - - - -  Calc LDL 0 - 99 mg/dL 62 - - - -  Triglycerides 0 - 149 mg/dL 99 - - - -  Creatinine 0.57 - 1.00 mg/dL 1.66(H) - - 1.75(H) 1.66(H)   BP/Weight 03/19/2020 02/05/2020 01/30/2020 12/03/2019 11/27/2019 11/22/2019 0/24/0973  Systolic BP 532 992 426 834 196 222 979  Diastolic BP 69 75 74 64 64 80 68  Wt. (Lbs) 225 231.04 235 220 220 227 222  BMI 36.32 37.29 37.93 35.51 35.51 36.64 35.83   Foot/eye exam completion dates Latest Ref Rng & Units 03/19/2020 03/06/2019  Eye Exam No Retinopathy - -  Foot Form Completion - Done Done        Hyperlipidemia LDL goal <100 Hyperlipidemia:Low fat diet discussed and encouraged.   Lipid Panel  Lab Results  Component Value Date   CHOL 111 06/20/2020   HDL 30 (  L) 06/20/2020   LDLCALC 62 06/20/2020   TRIG 99 06/20/2020   CHOLHDL 3.7 06/20/2020     Need to increase exercise  CKD (chronic kidney disease) stage 3, GFR 30-59 ml/min Deteriorating due to uncontrolled diabetes, importance of changing this is stressed ,a lso importance of control of BP  Morbid obesity ([place])  Patient re-educated about  the importance of commitment to a  minimum of 150 minutes of  exercise per week as able.  The importance of healthy food choices with portion control discussed, as well as eating regularly and within a 12 hour window most days. The need to choose "clean , green" food 50 to 75% of the time is discussed, as well as to make water the primary drink and set a goal of 64 ounces water daily.    Weight /BMI 03/19/2020 02/05/2020 01/30/2020  WEIGHT 225 lb 231 lb 0.6 oz 235 lb  HEIGHT 5\' 6"  5\' 6"  5\' 6"   BMI 36.32 kg/m2 37.29 kg/m2 37.93 kg/m2        Follow Up Instructions:    I discussed the assessment and treatment plan with the patient. The patient was provided an opportunity to ask questions and all were answered. The patient agreed with the plan and demonstrated an understanding of the instructions.   The patient was advised to call back or seek an in-person evaluation if the symptoms worsen or if the condition fails to improve as anticipated.  I provided  22 minutes of non-face-to-face time during this encounter.   Kathleen Nakayama, MD

## 2020-06-20 DIAGNOSIS — E559 Vitamin D deficiency, unspecified: Secondary | ICD-10-CM | POA: Diagnosis not present

## 2020-06-20 DIAGNOSIS — E1159 Type 2 diabetes mellitus with other circulatory complications: Secondary | ICD-10-CM | POA: Diagnosis not present

## 2020-06-20 DIAGNOSIS — E785 Hyperlipidemia, unspecified: Secondary | ICD-10-CM | POA: Diagnosis not present

## 2020-06-20 DIAGNOSIS — I10 Essential (primary) hypertension: Secondary | ICD-10-CM | POA: Diagnosis not present

## 2020-06-21 ENCOUNTER — Encounter: Payer: Self-pay | Admitting: Family Medicine

## 2020-06-21 NOTE — Assessment & Plan Note (Signed)
  Patient re-educated about  the importance of commitment to a  minimum of 150 minutes of exercise per week as able.  The importance of healthy food choices with portion control discussed, as well as eating regularly and within a 12 hour window most days. The need to choose "clean , green" food 50 to 75% of the time is discussed, as well as to make water the primary drink and set a goal of 64 ounces water daily.    Weight /BMI 03/19/2020 02/05/2020 01/30/2020  WEIGHT 225 lb 231 lb 0.6 oz 235 lb  HEIGHT 5\' 6"  5\' 6"  5\' 6"   BMI 36.32 kg/m2 37.29 kg/m2 37.93 kg/m2

## 2020-06-21 NOTE — Assessment & Plan Note (Signed)
Hyperlipidemia:Low fat diet discussed and encouraged.   Lipid Panel  Lab Results  Component Value Date   CHOL 111 06/20/2020   HDL 30 (L) 06/20/2020   LDLCALC 62 06/20/2020   TRIG 99 06/20/2020   CHOLHDL 3.7 06/20/2020     Need to increase exercise

## 2020-06-21 NOTE — Assessment & Plan Note (Signed)
Kathleen [name] is reminded of the importance of commitment to daily physical activity for 30 minutes or more, as able and the need to limit carbohydrate intake to 30 to 60 grams per meal to help with blood sugar control.   The need to take medication as prescribed, test blood sugar as directed, and to call between visits if there is a concern that blood sugar is uncontrolled is also discussed.   Kathleen [name] is reminded of the importance of daily foot exam, annual eye examination, and good blood sugar, blood pressure and cholesterol control. Deteriorated and uncontrolled , will be recalled for sooner appt  Diabetic Labs Latest Ref Rng & Units 06/20/2020 02/05/2020 01/30/2020 10/27/2019 10/25/2019  HbA1c 4.8 - 5.6 % 9.4(H) 7.5(A) 7.3(A) - -  Microalbumin mg/dL - - - - -  Micro/Creat Ratio - WILL FOLLOW - - - -  Chol 100 - 199 mg/dL 111 - - - -  HDL >39 mg/dL 30(L) - - - -  Calc LDL 0 - 99 mg/dL 62 - - - -  Triglycerides 0 - 149 mg/dL 99 - - - -  Creatinine 0.57 - 1.00 mg/dL 1.66(H) - - 1.75(H) 1.66(H)   BP/Weight 03/19/2020 02/05/2020 01/30/2020 12/03/2019 11/27/2019 11/22/2019 2/83/1517  Systolic BP 616 073 710 626 948 546 270  Diastolic BP 69 75 74 64 64 80 68  Wt. (Lbs) 225 231.04 235 220 220 227 222  BMI 36.32 37.29 37.93 35.51 35.51 36.64 35.83   Foot/eye exam completion dates Latest Ref Rng & Units 03/19/2020 03/06/2019  Eye Exam No Retinopathy - -  Foot Form Completion - Done Done

## 2020-06-21 NOTE — Assessment & Plan Note (Signed)
Deteriorating due to uncontrolled diabetes, importance of changing this is stressed ,a lso importance of control of BP

## 2020-06-22 LAB — CMP14+EGFR
ALT: 12 IU/L (ref 0–32)
AST: 10 IU/L (ref 0–40)
Albumin/Globulin Ratio: 1.2 (ref 1.2–2.2)
Albumin: 4.1 g/dL (ref 3.7–4.7)
Alkaline Phosphatase: 96 IU/L (ref 44–121)
BUN/Creatinine Ratio: 14 (ref 12–28)
BUN: 23 mg/dL (ref 8–27)
Bilirubin Total: 0.3 mg/dL (ref 0.0–1.2)
CO2: 20 mmol/L (ref 20–29)
Calcium: 9.9 mg/dL (ref 8.7–10.3)
Chloride: 103 mmol/L (ref 96–106)
Creatinine, Ser: 1.66 mg/dL — ABNORMAL HIGH (ref 0.57–1.00)
GFR calc Af Amer: 35 mL/min/{1.73_m2} — ABNORMAL LOW (ref >59)
GFR calc non Af Amer: 31 mL/min/{1.73_m2} — ABNORMAL LOW (ref >59)
Globulin, Total: 3.4 g/dL (ref 1.5–4.5)
Glucose: 92 mg/dL (ref 65–99)
Potassium: 5 mmol/L (ref 3.5–5.2)
Sodium: 137 mmol/L (ref 134–144)
Total Protein: 7.5 g/dL (ref 6.0–8.5)

## 2020-06-22 LAB — LIPID PANEL
Chol/HDL Ratio: 3.7 ratio (ref 0.0–4.4)
Cholesterol, Total: 111 mg/dL (ref 100–199)
HDL: 30 mg/dL — ABNORMAL LOW (ref >39)
LDL Chol Calc (NIH): 62 mg/dL (ref 0–99)
Triglycerides: 99 mg/dL (ref 0–149)
VLDL Cholesterol Cal: 19 mg/dL (ref 5–40)

## 2020-06-22 LAB — VITAMIN D 25 HYDROXY (VIT D DEFICIENCY, FRACTURES): Vit D, 25-Hydroxy: 29.4 ng/mL — ABNORMAL LOW (ref 30.0–100.0)

## 2020-06-22 LAB — MICROALBUMIN / CREATININE URINE RATIO
Creatinine, Urine: 110.9 mg/dL
Microalb/Creat Ratio: <3 mg/g creat (ref 0–29)
Microalbumin, Urine: <3 ug/mL

## 2020-06-22 LAB — HEMOGLOBIN A1C
Est. average glucose Bld gHb Est-mCnc: 223 mg/dL
Hgb A1c MFr Bld: 9.4 % — ABNORMAL HIGH (ref 4.8–5.6)

## 2020-06-26 ENCOUNTER — Encounter: Payer: Self-pay | Admitting: Family Medicine

## 2020-06-26 ENCOUNTER — Telehealth (INDEPENDENT_AMBULATORY_CARE_PROVIDER_SITE_OTHER): Payer: Medicare Other | Admitting: Family Medicine

## 2020-06-26 ENCOUNTER — Other Ambulatory Visit: Payer: Self-pay

## 2020-06-26 VITALS — Ht 66.0 in | Wt 220.0 lb

## 2020-06-26 DIAGNOSIS — E785 Hyperlipidemia, unspecified: Secondary | ICD-10-CM

## 2020-06-26 DIAGNOSIS — I10 Essential (primary) hypertension: Secondary | ICD-10-CM

## 2020-06-26 DIAGNOSIS — E1159 Type 2 diabetes mellitus with other circulatory complications: Secondary | ICD-10-CM | POA: Diagnosis not present

## 2020-06-26 MED ORDER — GLIPIZIDE ER 2.5 MG PO TB24: 2.5000 mg | ORAL_TABLET | Freq: Every day | ORAL | 3 refills | Status: DC

## 2020-06-26 NOTE — Patient Instructions (Addendum)
F/U in 5 weeks, with blood sugar log, , call if you need me before  New is glipizde 2.5 mg once daily take with breakfast, continue lantus 60 units every evening  TEST and record twice daily  Before breakfast , goal is 100 to 130  Bedtime, goal is 140 to 180  You are referred to the diabetic educator to help you to understand how to better control your sugar by food choice and portion control  Wonderful that you have stopped sugar and sweet drinks, gREAT move  It is important that you exercise regularly at least 30 minutes 5 times a week. If you develop chest pain, have severe difficulty breathing, or feel very tired, stop exercising immediately and seek medical attention  Thanks for choosing [facility], we consider it a privelige to serve you.  Thanks for choosing [facility], we consider it a privelige to serve you.

## 2020-06-29 ENCOUNTER — Encounter: Payer: Self-pay | Admitting: Family Medicine

## 2020-06-29 NOTE — Assessment & Plan Note (Signed)
  Patient re-educated about  the importance of commitment to a  minimum of 150 minutes of exercise per week as able.  The importance of healthy food choices with portion control discussed, as well as eating regularly and within a 12 hour window most days. The need to choose "clean , green" food 50 to 75% of the time is discussed, as well as to make water the primary drink and set a goal of 64 ounces water daily.    Weight /BMI 06/26/2020 03/19/2020 02/05/2020  WEIGHT 220 lb 225 lb 231 lb 0.6 oz  HEIGHT 5\' 6"  5\' 6"  5\' 6"   BMI 35.51 kg/m2 36.32 kg/m2 37.29 kg/m2

## 2020-06-29 NOTE — Progress Notes (Signed)
Virtual Visit via Telephone Note  I connected with Teleah Villamar [name] on 40/97/35 at  8:20 AM EST by telephone and verified that I am speaking with the correct person using two identifiers.  Location: Patient: home Provider: work   I discussed the limitations, risks, security and privacy concerns of performing an evaluation and management service by telephone and the availability of in person appointments. I also discussed with the patient that there may be a patient responsible charge related to this service. The patient expressed understanding and agreed to proceed.   History of Present Illness: Main reason for the  Visit is to address uncontrolled and deteriorated blood sugar despite patient reporting excellent numbers at a recent visit Denies polyuria, polydipsia, blurred vision , or hypoglycemic episodes. Agrees to diabetic educaion, although sites transport as a challenge Personally educated her re food choice and portion sizes for 10 minutes   Observations/Objective: Ht 5\' 6"  (1.676 m)   Wt 220 lb (99.8 kg)   BMI 35.51 kg/m  Good communication with no confusion and intact memory. Alert and oriented x 3 No signs of respiratory distress during speech    Assessment and Plan: Type 2 diabetes mellitus with vascular disease ([place]) Uncontrolled and deteriorated, add glipizide 2.5 mg daily, test twice daily and referred for diabetic ed Kathleen [name] is reminded of the importance of commitment to daily physical activity for 30 minutes or more, as able and the need to limit carbohydrate intake to 30 to 60 grams per meal to help with blood sugar control.   The need to take medication as prescribed, test blood sugar as directed, and to call between visits if there is a concern that blood sugar is uncontrolled is also discussed.   Kathleen [name] is reminded of the importance of daily foot exam, annual eye examination, and good blood sugar, blood pressure and cholesterol control.  Diabetic Labs  Latest Ref Rng & Units 06/20/2020 02/05/2020 01/30/2020 10/27/2019 10/25/2019  HbA1c 4.8 - 5.6 % 9.4(H) 7.5(A) 7.3(A) - -  Microalbumin mg/dL - - - - -  Micro/Creat Ratio 0 - 29 mg/g creat <3 - - - -  Chol 100 - 199 mg/dL 111 - - - -  HDL >39 mg/dL 30(L) - - - -  Calc LDL 0 - 99 mg/dL 62 - - - -  Triglycerides 0 - 149 mg/dL 99 - - - -  Creatinine 0.57 - 1.00 mg/dL 1.66(H) - - 1.75(H) 1.66(H)   BP/Weight 06/26/2020 03/19/2020 02/05/2020 01/30/2020 12/03/2019 11/27/2019 08/05/9240  Systolic BP - 683 419 622 297 989 211  Diastolic BP - 69 75 74 64 64 80  Wt. (Lbs) 220 225 231.04 235 220 220 227  BMI 35.51 36.32 37.29 37.93 35.51 35.51 36.64   Foot/eye exam completion dates Latest Ref Rng & Units 03/19/2020 03/06/2019  Eye Exam No Retinopathy - -  Foot Form Completion - Done Done   F/U with log in 5 weeks     Hyperlipidemia LDL goal <100 Controlled, no change in medication Hyperlipidemia:Low fat diet discussed and encouraged.   Lipid Panel  Lab Results  Component Value Date   CHOL 111 06/20/2020   HDL 30 (L) 06/20/2020   LDLCALC 62 06/20/2020   TRIG 99 06/20/2020   CHOLHDL 3.7 06/20/2020   Needs to increase exercise    Essential hypertension DASH diet and commitment to daily physical activity for a minimum of 30 minutes discussed and encouraged, as a part of hypertension management. The importance of attaining a  healthy weight is also discussed.  BP/Weight 06/26/2020 03/19/2020 02/05/2020 01/30/2020 12/03/2019 11/27/2019 8/89/1694  Systolic BP - 503 888 280 034 917 915  Diastolic BP - 69 75 74 64 64 80  Wt. (Lbs) 220 225 231.04 235 220 220 227  BMI 35.51 36.32 37.29 37.93 35.51 35.51 36.64       Morbid obesity (HCC)  Patient re-educated about  the importance of commitment to a  minimum of 150 minutes of exercise per week as able.  The importance of healthy food choices with portion control discussed, as well as eating regularly and within a 12 hour window most days. The need  to choose "clean , green" food 50 to 75% of the time is discussed, as well as to make water the primary drink and set a goal of 64 ounces water daily.    Weight /BMI 06/26/2020 03/19/2020 02/05/2020  WEIGHT 220 lb 225 lb 231 lb 0.6 oz  HEIGHT 5\' 6"  5\' 6"  5\' 6"   BMI 35.51 kg/m2 36.32 kg/m2 37.29 kg/m2        Follow Up Instructions:    I discussed the assessment and treatment plan with the patient. The patient was provided an opportunity to ask questions and all were answered. The patient agreed with the plan and demonstrated an understanding of the instructions.   The patient was advised to call back or seek an in-person evaluation if the symptoms worsen or if the condition fails to improve as anticipated.  I provided 15 minutes of non-face-to-face time during this encounter.   Kathleen Nakayama, MD

## 2020-06-29 NOTE — Assessment & Plan Note (Signed)
Uncontrolled and deteriorated, add glipizide 2.5 mg daily, test twice daily and referred for diabetic ed Kathleen [name] is reminded of the importance of commitment to daily physical activity for 30 minutes or more, as able and the need to limit carbohydrate intake to 30 to 60 grams per meal to help with blood sugar control.   The need to take medication as prescribed, test blood sugar as directed, and to call between visits if there is a concern that blood sugar is uncontrolled is also discussed.   Kathleen [name] is reminded of the importance of daily foot exam, annual eye examination, and good blood sugar, blood pressure and cholesterol control.  Diabetic Labs Latest Ref Rng & Units 06/20/2020 02/05/2020 01/30/2020 10/27/2019 10/25/2019  HbA1c 4.8 - 5.6 % 9.4(H) 7.5(A) 7.3(A) - -  Microalbumin mg/dL - - - - -  Micro/Creat Ratio 0 - 29 mg/g creat <3 - - - -  Chol 100 - 199 mg/dL 111 - - - -  HDL >39 mg/dL 30(L) - - - -  Calc LDL 0 - 99 mg/dL 62 - - - -  Triglycerides 0 - 149 mg/dL 99 - - - -  Creatinine 0.57 - 1.00 mg/dL 1.66(H) - - 1.75(H) 1.66(H)   BP/Weight 06/26/2020 03/19/2020 02/05/2020 01/30/2020 12/03/2019 11/27/2019 2/69/4854  Systolic BP - 627 035 009 381 829 937  Diastolic BP - 69 75 74 64 64 80  Wt. (Lbs) 220 225 231.04 235 220 220 227  BMI 35.51 36.32 37.29 37.93 35.51 35.51 36.64   Foot/eye exam completion dates Latest Ref Rng & Units 03/19/2020 03/06/2019  Eye Exam No Retinopathy - -  Foot Form Completion - Done Done   F/U with log in 5 weeks

## 2020-06-29 NOTE — Assessment & Plan Note (Signed)
DASH diet and commitment to daily physical activity for a minimum of 30 minutes discussed and encouraged, as a part of hypertension management. The importance of attaining a healthy weight is also discussed.  BP/Weight 06/26/2020 03/19/2020 02/05/2020 01/30/2020 12/03/2019 11/27/2019 1/64/2903  Systolic BP - 795 583 167 425 525 894  Diastolic BP - 69 75 74 64 64 80  Wt. (Lbs) 220 225 231.04 235 220 220 227  BMI 35.51 36.32 37.29 37.93 35.51 35.51 36.64

## 2020-06-29 NOTE — Assessment & Plan Note (Signed)
Controlled, no change in medication Hyperlipidemia:Low fat diet discussed and encouraged.   Lipid Panel  Lab Results  Component Value Date   CHOL 111 06/20/2020   HDL 30 (L) 06/20/2020   LDLCALC 62 06/20/2020   TRIG 99 06/20/2020   CHOLHDL 3.7 06/20/2020   Needs to increase exercise

## 2020-07-03 DIAGNOSIS — I5032 Chronic diastolic (congestive) heart failure: Secondary | ICD-10-CM | POA: Diagnosis not present

## 2020-07-03 DIAGNOSIS — I129 Hypertensive chronic kidney disease with stage 1 through stage 4 chronic kidney disease, or unspecified chronic kidney disease: Secondary | ICD-10-CM | POA: Diagnosis not present

## 2020-07-03 DIAGNOSIS — E1122 Type 2 diabetes mellitus with diabetic chronic kidney disease: Secondary | ICD-10-CM | POA: Diagnosis not present

## 2020-07-03 DIAGNOSIS — N189 Chronic kidney disease, unspecified: Secondary | ICD-10-CM | POA: Diagnosis not present

## 2020-07-03 DIAGNOSIS — E559 Vitamin D deficiency, unspecified: Secondary | ICD-10-CM | POA: Diagnosis not present

## 2020-07-07 ENCOUNTER — Other Ambulatory Visit: Payer: Self-pay | Admitting: Family Medicine

## 2020-07-07 DIAGNOSIS — E785 Hyperlipidemia, unspecified: Secondary | ICD-10-CM

## 2020-07-15 LAB — HM DIABETES EYE EXAM

## 2020-07-16 ENCOUNTER — Ambulatory Visit: Payer: Medicare Other | Admitting: Nutrition

## 2020-07-16 ENCOUNTER — Other Ambulatory Visit: Payer: Self-pay

## 2020-07-16 ENCOUNTER — Encounter: Payer: Self-pay | Admitting: Family Medicine

## 2020-07-16 ENCOUNTER — Telehealth (INDEPENDENT_AMBULATORY_CARE_PROVIDER_SITE_OTHER): Payer: Medicare Other | Admitting: Family Medicine

## 2020-07-16 VITALS — Ht 66.0 in | Wt 218.0 lb

## 2020-07-16 DIAGNOSIS — Z1211 Encounter for screening for malignant neoplasm of colon: Secondary | ICD-10-CM | POA: Diagnosis not present

## 2020-07-16 DIAGNOSIS — E1159 Type 2 diabetes mellitus with other circulatory complications: Secondary | ICD-10-CM

## 2020-07-16 DIAGNOSIS — E785 Hyperlipidemia, unspecified: Secondary | ICD-10-CM | POA: Diagnosis not present

## 2020-07-16 DIAGNOSIS — N1832 Chronic kidney disease, stage 3b: Secondary | ICD-10-CM

## 2020-07-16 DIAGNOSIS — I10 Essential (primary) hypertension: Secondary | ICD-10-CM

## 2020-07-16 DIAGNOSIS — I129 Hypertensive chronic kidney disease with stage 1 through stage 4 chronic kidney disease, or unspecified chronic kidney disease: Secondary | ICD-10-CM

## 2020-07-16 MED ORDER — GABAPENTIN 100 MG PO CAPS: 100.0000 mg | ORAL_CAPSULE | Freq: Two times a day (BID) | ORAL | 5 refills | Status: DC

## 2020-07-16 NOTE — Patient Instructions (Addendum)
Annual physical exam with Md end May, call if t you need me sooner  Non fast HBA1C, chem 7 and EGFr May 11 or shortly after  Need colonoscopy and you are referred  Thankful that you are getting health maintenance done  Continue testing and taking medication and following diabetic diet  Thanks for choosing [facility], we consider it a privelige to serve you.

## 2020-07-16 NOTE — Progress Notes (Signed)
Virtual Visit via Telephone Note  I connected with Kathleen [name] on 78/24/23 at  8:40 AM EST by telephone and verified that I am speaking with the correct person using two identifiers.  Location: Patient: home Provider: office   I discussed the limitations, risks, security and privacy concerns of performing an evaluation and management service by telephone and the availability of in person appointments. I also discussed with the patient that there may be a patient responsible charge related to this service. The patient expressed understanding and agreed to proceed.   History of Present Illness: F/U chronic problems in particular uncontrolled diabetes and address any new or current concerns. Review and update medications and allergies. Review recent lab and radiologic data . Update routine health maintainace. Review an encourage improved health habits to include nutrition, exercise and  sleep . Blood sugar 75, 110,; 134, 120, 141, 130, 114, 122, 132, 135, 140 Denies polyuria, polydipsia, blurred vision , or hypoglycemic episodes.  Denies recent fever or chills. Denies sinus pressure, nasal congestion, ear pain or sore throat. Denies chest congestion, productive cough or wheezing. Denies chest pains, palpitations and leg swelling Denies abdominal pain, nausea, vomiting,diarrhea or constipation.   Denies dysuria, frequency, hesitancy or incontinence. Denies joint pain, swelling and limitation in mobility. Denies headaches, seizures, numbness, or tingling. Denies depression, anxiety or insomnia. Denies skin break down or rash.     Observations/Objective: Ht 5\' 6"  (1.676 m)   Wt 218 lb (98.9 kg)   BMI 35.19 kg/m  Good communication with no confusion and intact memory. Alert and oriented x 3 No signs of respiratory distress during speech    Assessment and Plan: Type 2 diabetes mellitus with vascular disease (Kathleen [name]) Uncontrolled though reports improved numbers Kathleen [name] is  reminded of the importance of commitment to daily physical activity for 30 minutes or more, as able and the need to limit carbohydrate intake to 30 to 60 grams per meal to help with blood sugar control.   The need to take medication as prescribed, test blood sugar as directed, and to call between visits if there is a concern that blood sugar is uncontrolled is also discussed.   Kathleen [name] is reminded of the importance of daily foot exam, annual eye examination, and good blood sugar, blood pressure and cholesterol control.  Diabetic Labs Latest Ref Rng & Units 06/20/2020 02/05/2020 01/30/2020 10/27/2019 10/25/2019  HbA1c 4.8 - 5.6 % 9.4(H) 7.5(A) 7.3(A) - -  Microalbumin mg/dL - - - - -  Micro/Creat Ratio 0 - 29 mg/g creat <3 - - - -  Chol 100 - 199 mg/dL 111 - - - -  HDL >39 mg/dL 30(L) - - - -  Calc LDL 0 - 99 mg/dL 62 - - - -  Triglycerides 0 - 149 mg/dL 99 - - - -  Creatinine 0.57 - 1.00 mg/dL 1.66(H) - - 1.75(H) 1.66(H)   BP/Weight 07/16/2020 06/26/2020 03/19/2020 02/05/2020 01/30/2020 12/03/2019 5/36/1443  Systolic BP - - 154 008 676 195 093  Diastolic BP - - 69 75 74 64 64  Wt. (Lbs) 218 220 225 231.04 235 220 220  BMI 35.19 35.51 36.32 37.29 37.93 35.51 35.51   Foot/eye exam completion dates Latest Ref Rng & Units 03/19/2020 03/06/2019  Eye Exam No Retinopathy - -  Foot Form Completion - Done Done        Essential hypertension DASH diet and commitment to daily physical activity for a minimum of 30 minutes discussed and encouraged, as a part  of hypertension management. The importance of attaining a healthy weight is also discussed.  BP/Weight 07/16/2020 06/26/2020 03/19/2020 02/05/2020 01/30/2020 12/03/2019 0/62/3762  Systolic BP - - 831 517 616 073 710  Diastolic BP - - 69 75 74 64 64  Wt. (Lbs) 218 220 225 231.04 235 220 220  BMI 35.19 35.51 36.32 37.29 37.93 35.51 35.51       Morbid obesity (HCC) Obesity linked with hypertension and diabetes  Patient re-educated about  the  importance of commitment to a  minimum of 150 minutes of exercise per week as able.  The importance of healthy food choices with portion control discussed, as well as eating regularly and within a 12 hour window most days. The need to choose "clean , green" food 50 to 75% of the time is discussed, as well as to make water the primary drink and set a goal of 64 ounces water daily.    Weight /BMI 07/16/2020 06/26/2020 03/19/2020  WEIGHT 218 lb 220 lb 225 lb  HEIGHT 5\' 6"  5\' 6"  5\' 6"   BMI 35.19 kg/m2 35.51 kg/m2 36.32 kg/m2      Hyperlipidemia LDL goal <100 Hyperlipidemia:Low fat diet discussed and encouraged.   Lipid Panel  Lab Results  Component Value Date   CHOL 111 06/20/2020   HDL 30 (L) 06/20/2020   LDLCALC 62 06/20/2020   TRIG 99 06/20/2020   CHOLHDL 3.7 06/20/2020     Needs to increase exercise to improve HDL    Follow Up Instructions:    I discussed the assessment and treatment plan with the patient. The patient was provided an opportunity to ask questions and all were answered. The patient agreed with the plan and demonstrated an understanding of the instructions.   The patient was advised to call back or seek an in-person evaluation if the symptoms worsen or if the condition fails to improve as anticipated.  I provided 20 minutes of non-face-to-face time during this encounter.   Tula Nakayama, MD

## 2020-07-18 ENCOUNTER — Encounter: Payer: Self-pay | Admitting: Family Medicine

## 2020-07-18 NOTE — Assessment & Plan Note (Signed)
Obesity linked with hypertension and diabetes  Patient re-educated about  the importance of commitment to a  minimum of 150 minutes of exercise per week as able.  The importance of healthy food choices with portion control discussed, as well as eating regularly and within a 12 hour window most days. The need to choose "clean , green" food 50 to 75% of the time is discussed, as well as to make water the primary drink and set a goal of 64 ounces water daily.    Weight /BMI 07/16/2020 06/26/2020 03/19/2020  WEIGHT 218 lb 220 lb 225 lb  HEIGHT 5\' 6"  5\' 6"  5\' 6"   BMI 35.19 kg/m2 35.51 kg/m2 36.32 kg/m2

## 2020-07-18 NOTE — Assessment & Plan Note (Signed)
Uncontrolled though reports improved numbers Kathleen [name] is reminded of the importance of commitment to daily physical activity for 30 minutes or more, as able and the need to limit carbohydrate intake to 30 to 60 grams per meal to help with blood sugar control.   The need to take medication as prescribed, test blood sugar as directed, and to call between visits if there is a concern that blood sugar is uncontrolled is also discussed.   Kathleen [name] is reminded of the importance of daily foot exam, annual eye examination, and good blood sugar, blood pressure and cholesterol control.  Diabetic Labs Latest Ref Rng & Units 06/20/2020 02/05/2020 01/30/2020 10/27/2019 10/25/2019  HbA1c 4.8 - 5.6 % 9.4(H) 7.5(A) 7.3(A) - -  Microalbumin mg/dL - - - - -  Micro/Creat Ratio 0 - 29 mg/g creat <3 - - - -  Chol 100 - 199 mg/dL 111 - - - -  HDL >39 mg/dL 30(L) - - - -  Calc LDL 0 - 99 mg/dL 62 - - - -  Triglycerides 0 - 149 mg/dL 99 - - - -  Creatinine 0.57 - 1.00 mg/dL 1.66(H) - - 1.75(H) 1.66(H)   BP/Weight 07/16/2020 06/26/2020 03/19/2020 02/05/2020 01/30/2020 12/03/2019 06/03/5807  Systolic BP - - 983 382 505 397 673  Diastolic BP - - 69 75 74 64 64  Wt. (Lbs) 218 220 225 231.04 235 220 220  BMI 35.19 35.51 36.32 37.29 37.93 35.51 35.51   Foot/eye exam completion dates Latest Ref Rng & Units 03/19/2020 03/06/2019  Eye Exam No Retinopathy - -  Foot Form Completion - Done Done

## 2020-07-18 NOTE — Assessment & Plan Note (Signed)
Hyperlipidemia:Low fat diet discussed and encouraged.   Lipid Panel  Lab Results  Component Value Date   CHOL 111 06/20/2020   HDL 30 (L) 06/20/2020   LDLCALC 62 06/20/2020   TRIG 99 06/20/2020   CHOLHDL 3.7 06/20/2020     Needs to increase exercise to improve HDL

## 2020-07-18 NOTE — Assessment & Plan Note (Signed)
DASH diet and commitment to daily physical activity for a minimum of 30 minutes discussed and encouraged, as a part of hypertension management. The importance of attaining a healthy weight is also discussed.  BP/Weight 07/16/2020 06/26/2020 03/19/2020 02/05/2020 01/30/2020 12/03/2019 1/44/8185  Systolic BP - - 631 497 026 378 588  Diastolic BP - - 69 75 74 64 64  Wt. (Lbs) 218 220 225 231.04 235 220 220  BMI 35.19 35.51 36.32 37.29 37.93 35.51 35.51

## 2020-07-21 ENCOUNTER — Ambulatory Visit (HOSPITAL_COMMUNITY): Payer: Medicare Other

## 2020-07-22 ENCOUNTER — Encounter: Payer: Self-pay | Admitting: Internal Medicine

## 2020-07-31 ENCOUNTER — Other Ambulatory Visit: Payer: Self-pay

## 2020-07-31 ENCOUNTER — Ambulatory Visit (HOSPITAL_COMMUNITY)
Admission: RE | Admit: 2020-07-31 | Discharge: 2020-07-31 | Disposition: A | Discharge status: Home / Self Care | Payer: Medicare Other | Source: Ambulatory Visit | Attending: Family Medicine | Admitting: Family Medicine

## 2020-07-31 ENCOUNTER — Telehealth: Payer: Medicare Other | Admitting: Family Medicine

## 2020-07-31 ENCOUNTER — Other Ambulatory Visit: Payer: Self-pay | Admitting: Family Medicine

## 2020-07-31 DIAGNOSIS — Z1231 Encounter for screening mammogram for malignant neoplasm of breast: Secondary | ICD-10-CM | POA: Insufficient documentation

## 2020-08-05 ENCOUNTER — Encounter: Payer: Self-pay | Admitting: Internal Medicine

## 2020-08-05 ENCOUNTER — Ambulatory Visit (INDEPENDENT_AMBULATORY_CARE_PROVIDER_SITE_OTHER): Payer: Medicare Other | Admitting: Internal Medicine

## 2020-08-05 ENCOUNTER — Other Ambulatory Visit: Payer: Self-pay

## 2020-08-05 ENCOUNTER — Encounter (INDEPENDENT_AMBULATORY_CARE_PROVIDER_SITE_OTHER): Payer: Self-pay | Admitting: *Deleted

## 2020-08-05 ENCOUNTER — Ambulatory Visit (HOSPITAL_COMMUNITY)
Admission: RE | Admit: 2020-08-05 | Discharge: 2020-08-05 | Disposition: A | Discharge status: Home / Self Care | Payer: Medicare Other | Source: Ambulatory Visit | Attending: Internal Medicine | Admitting: Internal Medicine

## 2020-08-05 VITALS — BP 168/79 | HR 85 | Resp 18 | Ht 66.0 in | Wt 218.0 lb

## 2020-08-05 DIAGNOSIS — M79642 Pain in left hand: Secondary | ICD-10-CM | POA: Diagnosis not present

## 2020-08-05 DIAGNOSIS — M19042 Primary osteoarthritis, left hand: Secondary | ICD-10-CM | POA: Diagnosis not present

## 2020-08-05 DIAGNOSIS — M7989 Other specified soft tissue disorders: Secondary | ICD-10-CM | POA: Diagnosis not present

## 2020-08-05 DIAGNOSIS — Z1211 Encounter for screening for malignant neoplasm of colon: Secondary | ICD-10-CM

## 2020-08-05 MED ORDER — PREDNISONE 20 MG PO TABS: 40.0000 mg | ORAL_TABLET | Freq: Every day | ORAL | 0 refills | Status: DC

## 2020-08-05 NOTE — Patient Instructions (Signed)
Hand Pain Many things can cause hand pain. Some common causes are:  An injury.  Repeating the same movement with your hand over and over (overuse).  Osteoporosis.  Arthritis.  Lumps in the tendons or joints of the hand and wrist (ganglion cysts).  Nerve compression syndromes (carpal tunnel syndrome).  Inflammation of the tendons (tendinitis).  Infection. Follow these instructions at home: Pay attention to any changes in your symptoms. Take these actions to help with your discomfort: Managing pain, stiffness, and swelling  Take over-the-counter and prescription medicines only as told by your health care provider.  Wear a hand splint or support as told by your health care provider.  If directed, put ice on the affected area: ? Put ice in a plastic bag. ? Place a towel between your skin and the bag. ? Leave the ice on for 20 minutes, 2-3 times a day.   Activity  Take breaks from repetitive activity often.  Avoid activities that make your pain worse.  Minimize stress on your hands and wrists as much as possible.  Do stretches or exercises as told by your health care provider.  Do not do activities that make your pain worse. Contact a health care provider if:  Your pain does not get better after a few days of self-care.  Your pain gets worse.  Your pain affects your ability to do your daily activities. Get help right away if:  Your hand becomes warm, red, or swollen.  Your hand is numb or tingling.  Your hand is extremely swollen or deformed.  Your hand or fingers turn white or blue.  You cannot move your hand, wrist, or fingers. Summary  Many things can cause hand pain.  Contact your health care provider if your pain does not get better after a few days of self care.  Minimize stress on your hands and wrists as much as possible.  Do not do activities that make your pain worse. This information is not intended to replace advice given to you by your  health care provider. Make sure you discuss any questions you have with your health care provider. Document Revised: 01/20/2018 Document Reviewed: 01/20/2018 [employer]vier Patient Education  2021 [employer]  

## 2020-08-05 NOTE — Progress Notes (Signed)
Acute Office Visit  Subjective:    Patient ID: Kathleen [name], female    DOB: 06/17/1950, 70 y.o.   MRN: [id number]  Chief Complaint  Patient presents with  . Acute Visit    Pt left hand has been swelling since Friday its also painful    HPI Patient is in today for evaluation of acute onset left hand pain and swelling. She states pain is constant, 8/10, worse with movement and better with Ibuprofen. She had similar pain and swelling over left foot few days ago, which resolved with Ibuprofen. She denies any recent injury. Denies insect bite. Denies alcohol use. Denies numbness or tingling of the hand, but is not able to make fist due to severe pain. Denies any fever, chills, nausea, vomiting, dysuria or hematuria.  Past Medical History:  Diagnosis Date  . Acute respiratory failure with hypoxia ([place]) 10/22/2019  . Anemia   . Arthritis   . Chronic kidney disease   . Diabetes mellitus type II   . Dysphagia    unspecified   . GERD (gastroesophageal reflux disease)   . Hyperlipidemia 2000  . Hypertension 1995  . Insomnia   . Low back pain   . Obesity   . Sleep apnea in adult 11/25/2019  . Thyroid nodule 11/25/2019    Past Surgical History:  Procedure Laterality Date  . Carpal tunnel release     left   . CHOLECYSTECTOMY  2007  . DIGIT NAIL REMOVAL  08/2011  . KNEE SURGERY  1.20.2012   arthroscopy LEFT knee partial medial meniscectomy  . TENOTOMY     2,3,4  left foot   . toenail removal    . TUBAL LIGATION      Family History  Problem Relation Age of Onset  . Diabetes Mother   . Hypertension Mother        MI  . Heart disease Mother   . Kidney disease Mother 61       dialysis  . Diabetes Sister   . Diabetes Brother   . Diabetes Brother   . Kidney disease Brother   . Dementia Sister   . Diabetes Father     Social History   Socioeconomic History  . Marital status: Divorced    Spouse name: Not on file  . Number of children: 4  . Years of education: Not on  file  . Highest education level: 11th grade  Occupational History  . Occupation: Full time Engineer, petroleum   Tobacco Use  . Smoking status: Never Smoker  . Smokeless tobacco: Never Used  Vaping Use  . Vaping Use: Never used  Substance and Sexual Activity  . Alcohol use: No  . Drug use: No  . Sexual activity: Yes  Other Topics Concern  . Not on file  Social History Narrative   Lives with 2nd oldest son   Social Determinants of Health   Financial Resource Strain: Low Risk   . Difficulty of Paying Living Expenses: Not hard at all  Food Insecurity: No Food Insecurity  . Worried About Charity fundraiser in the Last Year: Never true  . Ran Out of Food in the Last Year: Never true  Transportation Needs: No Transportation Needs  . Lack of Transportation (Medical): No  . Lack of Transportation (Non-Medical): No  Physical Activity: Insufficiently Active  . Days of Exercise per Week: 2 days  . Minutes of Exercise per Session: 10 min  Stress: No Stress Concern Present  . Feeling of  Stress : Not at all  Social Connections: Moderately Isolated  . Frequency of Communication with Friends and Family: More than three times a week  . Frequency of Social Gatherings with Friends and Family: More than three times a week  . Attends Religious Services: More than 4 times per year  . Active Member of Clubs or Organizations: No  . Attends Archivist Meetings: Never  . Marital Status: Divorced  Human resources officer Violence: Not on file    Outpatient Medications Prior to Visit  Medication Sig Dispense Refill  . ACCU-CHEK GUIDE test strip USE TO TEST THREE TIMES DAILY. 100 strip 0  . acetaminophen (TYLENOL) 325 MG tablet Take 2 tablets (650 mg total) by mouth every 6 (six) hours as needed for mild pain, fever or headache (or Fever >/= 101). 12 tablet 0  . amLODipine (NORVASC) 10 MG tablet TAKE ONE TABLET BY MOUTH ONCE DAILY FOR BLOOD PRESSURE. 90 tablet 0  . cholecalciferol (VITAMIN D) 1000  UNITS tablet Take 1,000 Units by mouth daily.    . cloNIDine (CATAPRES) 0.3 MG tablet TAKE (1) TABLET BY MOUTH 3 TIMES DAILY. 270 tablet 0  . ELIQUIS 5 MG TABS tablet TAKE 1 TABLET BY MOUTH TWICE DAILY. 60 tablet 0  . fluticasone (FLONASE) 50 MCG/ACT nasal spray Place 2 sprays into both nostrils daily as needed. (Patient taking differently: Place 2 sprays into both nostrils daily as needed for allergies or rhinitis.) 15 g 1  . gabapentin (NEURONTIN) 100 MG capsule Take 1 capsule (100 mg total) by mouth 2 (two) times daily. 60 capsule 5  . glipiZIDE (GLUCOTROL XL) 2.5 MG 24 hr tablet Take 1 tablet (2.5 mg total) by mouth daily with breakfast. 30 tablet 3  . insulin glargine (LANTUS) 100 UNIT/ML Solostar Pen Inject 60 Units into the skin daily. 15 mL 11  . lisinopril (ZESTRIL) 20 MG tablet TAKE 1 TABLET BY MOUTH ONCE A DAY. 30 tablet 0  . pantoprazole (PROTONIX) 40 MG tablet Take 1 tablet (40 mg total) by mouth daily. 30 tablet 3  . polyethylene glycol (MIRALAX / GLYCOLAX) 17 g packet Take 17 g by mouth daily. 30 each 1  . pravastatin (PRAVACHOL) 40 MG tablet TAKE ONE TABLET BY MOUTH ONCE DAILY. 30 tablet 0  . ramipril (ALTACE) 10 MG capsule TAKE (1) CAPSULE BY MOUTH ONCE DAILY. 90 capsule 0  . senna-docusate (SENOKOT-S) 8.6-50 MG tablet Take 2 tablets by mouth 2 (two) times daily. 120 tablet 2  . spironolactone-hydrochlorothiazide (ALDACTAZIDE) 25-25 MG tablet TAKE ONE TABLET BY MOUTH ONCE DAILY. 90 tablet 0   No facility-administered medications prior to visit.    No Known Allergies  Review of Systems  Constitutional: Negative for chills and fever.  Eyes: Negative for pain and discharge.  Genitourinary: Negative for dysuria and hematuria.  Musculoskeletal: Positive for arthralgias (Left hand small joints) and joint swelling.  Neurological: Negative for dizziness and weakness.       Objective:    Physical Exam Constitutional:      General: She is not in acute distress.    Appearance:  She is obese. She is not diaphoretic.  HENT:     Head: Normocephalic and atraumatic.     Nose: Nose normal. No congestion.  Eyes:     General: No scleral icterus.    Extraocular Movements: Extraocular movements intact.  Musculoskeletal:     Right hand: Normal.     Left hand: Swelling (PIP and DIP joints of left hand) and tenderness present.  Decreased range of motion.  Neurological:     Mental Status: She is alert.     BP (!) 168/79 (BP Location: Right Arm, Patient Position: Sitting, Cuff Size: Normal)   Pulse 85   Resp 18   Ht 5\' 6"  (1.676 m)   Wt 218 lb (98.9 kg)   SpO2 95%   BMI 35.19 kg/m  Wt Readings from Last 3 Encounters:  08/05/20 218 lb (98.9 kg)  07/16/20 218 lb (98.9 kg)  06/26/20 220 lb (99.8 kg)    Health Maintenance Due  Topic Date Due  . OPHTHALMOLOGY EXAM  06/07/2019  . COLONOSCOPY (Pts 45-10yrs Insurance coverage will need to be confirmed)  12/02/2019    There are no preventive care reminders to display for this patient.   Lab Results  Component Value Date   TSH 1.110 10/22/2019   Lab Results  Component Value Date   WBC 8.2 10/28/2019   HGB 10.1 (L) 10/28/2019   HCT 32.6 (L) 10/28/2019   MCV 85.3 10/28/2019   PLT 258 10/28/2019   Lab Results  Component Value Date   NA 137 06/20/2020   K 5.0 06/20/2020   CO2 20 06/20/2020   GLUCOSE 92 06/20/2020   BUN 23 06/20/2020   CREATININE 1.66 (H) 06/20/2020   BILITOT 0.3 06/20/2020   ALKPHOS 96 06/20/2020   AST 10 06/20/2020   ALT 12 06/20/2020   PROT 7.5 06/20/2020   ALBUMIN 4.1 06/20/2020   CALCIUM 9.9 06/20/2020   ANIONGAP 5 10/27/2019   Lab Results  Component Value Date   CHOL 111 06/20/2020   Lab Results  Component Value Date   HDL 30 (L) 06/20/2020   Lab Results  Component Value Date   LDLCALC 62 06/20/2020   Lab Results  Component Value Date   TRIG 99 06/20/2020   Lab Results  Component Value Date   CHOLHDL 3.7 06/20/2020   Lab Results  Component Value Date   HGBA1C  9.4 (H) 06/20/2020       Assessment & Plan:   Problem List Items Addressed This Visit      Other   Left hand pain - Primary Swelling concerning for gout flare As patient is on Eliquis and h/o CKD, prescribed steroids Check X-ray of the left hand Referred to Orthopedic surgeon for possible arthrocentesis   Relevant Medications   predniSONE (DELTASONE) 20 MG tablet   Other Relevant Orders   Ambulatory referral to Orthopedic Surgery   DG Hand Complete Left    Other Visit Diagnoses    Special screening for malignant neoplasms, colon       Relevant Orders   Ambulatory referral to Gastroenterology       Meds ordered this encounter  Medications  . predniSONE (DELTASONE) 20 MG tablet    Sig: Take 2 tablets (40 mg total) by mouth daily with breakfast.    Dispense:  5 tablet    Refill:  0     Zeynep Fantroy Keith Rake, MD

## 2020-08-07 ENCOUNTER — Ambulatory Visit: Payer: Medicare Other | Admitting: Orthopedic Surgery

## 2020-08-08 ENCOUNTER — Other Ambulatory Visit: Payer: Self-pay

## 2020-08-08 ENCOUNTER — Encounter: Payer: Self-pay | Admitting: Orthopedic Surgery

## 2020-08-08 ENCOUNTER — Ambulatory Visit: Payer: Medicare Other | Admitting: Orthopedic Surgery

## 2020-08-08 VITALS — BP 175/79 | HR 95 | Ht 66.0 in | Wt 216.0 lb

## 2020-08-08 DIAGNOSIS — M79642 Pain in left hand: Secondary | ICD-10-CM

## 2020-08-08 NOTE — Progress Notes (Deleted)
New Patient Visit  Assessment: Kathleen [name] is a 70 y.o. female with the following: There are no diagnoses linked to this encounter.  Plan:  Follow-up: No follow-ups on file.  Subjective:  Chief Complaint  Patient presents with  . Hand Pain    Lt hand pain and swelling for 1-2 wks. Pt states she recently has the same type of pain in swelling in her left foot and it went down after topical rubs and prednisone.    History of Present Illness: Kathleen [name] is a 70 y.o. female who presents    Review of Systems: No fevers or chills*** No numbness or tingling No chest pain No shortness of breath No bowel or bladder dysfunction No GI distress No headaches   Medical History:  Past Medical History:  Diagnosis Date  . Acute respiratory failure with hypoxia ([place]) 10/22/2019  . Anemia   . Arthritis   . Chronic kidney disease   . Diabetes mellitus type II   . Dysphagia    unspecified   . GERD (gastroesophageal reflux disease)   . Hyperlipidemia 2000  . Hypertension 1995  . Insomnia   . Low back pain   . Obesity   . Sleep apnea in adult 11/25/2019  . Thyroid nodule 11/25/2019    Past Surgical History:  Procedure Laterality Date  . Carpal tunnel release     left   . CHOLECYSTECTOMY  2007  . DIGIT NAIL REMOVAL  08/2011  . KNEE SURGERY  1.20.2012   arthroscopy LEFT knee partial medial meniscectomy  . TENOTOMY     2,3,4  left foot   . toenail removal    . TUBAL LIGATION      Family History  Problem Relation Age of Onset  . Diabetes Mother   . Hypertension Mother        MI  . Heart disease Mother   . Kidney disease Mother 32       dialysis  . Diabetes Sister   . Diabetes Brother   . Diabetes Brother   . Kidney disease Brother   . Dementia Sister   . Diabetes Father    Social History   Tobacco Use  . Smoking status: Never Smoker  . Smokeless tobacco: Never Used  Vaping Use  . Vaping Use: Never used  Substance Use Topics  . Alcohol use: No  .  Drug use: No    No Known Allergies  Current Meds  Medication Sig  . ACCU-CHEK GUIDE test strip USE TO TEST THREE TIMES DAILY.  Marland Kitchen acetaminophen (TYLENOL) 325 MG tablet Take 2 tablets (650 mg total) by mouth every 6 (six) hours as needed for mild pain, fever or headache (or Fever >/= 101).  Marland Kitchen amLODipine (NORVASC) 10 MG tablet TAKE ONE TABLET BY MOUTH ONCE DAILY FOR BLOOD PRESSURE.  . cholecalciferol (VITAMIN D) 1000 UNITS tablet Take 1,000 Units by mouth daily.  . cloNIDine (CATAPRES) 0.3 MG tablet TAKE (1) TABLET BY MOUTH 3 TIMES DAILY.  Marland Kitchen ELIQUIS 5 MG TABS tablet TAKE 1 TABLET BY MOUTH TWICE DAILY.  . fluticasone (FLONASE) 50 MCG/ACT nasal spray Place 2 sprays into both nostrils daily as needed. (Patient taking differently: Place 2 sprays into both nostrils daily as needed for allergies or rhinitis.)  . gabapentin (NEURONTIN) 100 MG capsule Take 1 capsule (100 mg total) by mouth 2 (two) times daily.  Marland Kitchen glipiZIDE (GLUCOTROL XL) 2.5 MG 24 hr tablet Take 1 tablet (2.5 mg total) by mouth daily with breakfast.  .  insulin glargine (LANTUS) 100 UNIT/ML Solostar Pen Inject 60 Units into the skin daily.  Marland Kitchen lisinopril (ZESTRIL) 20 MG tablet TAKE 1 TABLET BY MOUTH ONCE A DAY.  . pantoprazole (PROTONIX) 40 MG tablet Take 1 tablet (40 mg total) by mouth daily.  . polyethylene glycol (MIRALAX / GLYCOLAX) 17 g packet Take 17 g by mouth daily.  . pravastatin (PRAVACHOL) 40 MG tablet TAKE ONE TABLET BY MOUTH ONCE DAILY.  . ramipril (ALTACE) 10 MG capsule TAKE (1) CAPSULE BY MOUTH ONCE DAILY.  Marland Kitchen senna-docusate (SENOKOT-S) 8.6-50 MG tablet Take 2 tablets by mouth 2 (two) times daily.  Marland Kitchen spironolactone-hydrochlorothiazide (ALDACTAZIDE) 25-25 MG tablet TAKE ONE TABLET BY MOUTH ONCE DAILY.    Objective: BP (!) 175/79   Pulse 95   Ht 5\' 6"  (1.676 m)   Wt 216 lb (98 kg)   BMI 34.86 kg/m   Physical Exam:  General:  Gait:     IMAGING: {XR Reviewed:24899}   New Medications:  No orders of the  defined types were placed in this encounter.     Mordecai Rasmussen, MD  08/08/2020 11:14 AM

## 2020-08-08 NOTE — Progress Notes (Signed)
New Patient Visit  Assessment: Kathleen [name] is a 70 y.o. female with the following: Left wrist pain Left hand DIP arthritis  Plan: Patient has diffuse pain about her left wrist.  We reviewed radiographs in clinic today which demonstrates no obvious abnormality.  The swelling that she experienced in her left hand has significantly improved since starting prednisone.  There is no redness or warmth about her wrist so I do not think that her current symptoms are caused by gout.  In addition, she has never had a flare of gout in the past.  As result, I elected not to aspirate the wrist.  If this truly is gout, it can be treated medically.  Because of the pain she is experiencing in her left wrist, I offered her a wrist brace, and she elected to proceed.  She was fitted for the brace today, and I urged her to use this as needed, and gradually wean off use the brace.  She stated understanding.  Follow-up as needed.   Follow-up: Return if symptoms worsen or fail to improve.  Subjective:  Chief Complaint  Patient presents with  . Hand Pain    Lt hand pain and swelling for 1-2 wks. Pt states she recently has the same type of pain in swelling in her left foot and it went down after topical rubs and prednisone.    History of Present Illness: Kathleen [name] is a 70 y.o. female who has been referred to clinic today by Ihor Dow, MD for evaluation of left hand swelling.  She has noticed swelling of her left hand, as well as her left foot over the last 1-2 weeks.  She was evaluated the other day in clinic by Dr. Posey Pronto, who provided her with some prednisone.  She has completed this prescription and states the swelling has significantly improved.  She also states that she has been rubbing and ointment on her hands, and this has also helped with the swelling.  She does not have a history of gout.  She also notes some pain in her left wrist.  No specific injury to her wrist.  She also notices swelling to  her fingers, as well as pain.  She states she uses her left hand for everything, and is currently very difficult for her.  She has never worked with therapy.   Review of Systems: No fevers or chills No numbness or tingling No chest pain No shortness of breath No bowel or bladder dysfunction No GI distress No headaches   Medical History:  Past Medical History:  Diagnosis Date  . Acute respiratory failure with hypoxia ([place]) 10/22/2019  . Anemia   . Arthritis   . Chronic kidney disease   . Diabetes mellitus type II   . Dysphagia    unspecified   . GERD (gastroesophageal reflux disease)   . Hyperlipidemia 2000  . Hypertension 1995  . Insomnia   . Low back pain   . Obesity   . Sleep apnea in adult 11/25/2019  . Thyroid nodule 11/25/2019    Past Surgical History:  Procedure Laterality Date  . Carpal tunnel release     left   . CHOLECYSTECTOMY  2007  . DIGIT NAIL REMOVAL  08/2011  . KNEE SURGERY  1.20.2012   arthroscopy LEFT knee partial medial meniscectomy  . TENOTOMY     2,3,4  left foot   . toenail removal    . TUBAL LIGATION      Family History  Problem  Relation Age of Onset  . Diabetes Mother   . Hypertension Mother        MI  . Heart disease Mother   . Kidney disease Mother 37       dialysis  . Diabetes Sister   . Diabetes Brother   . Diabetes Brother   . Kidney disease Brother   . Dementia Sister   . Diabetes Father    Social History   Tobacco Use  . Smoking status: Never Smoker  . Smokeless tobacco: Never Used  Vaping Use  . Vaping Use: Never used  Substance Use Topics  . Alcohol use: No  . Drug use: No    No Known Allergies  Current Meds  Medication Sig  . ACCU-CHEK GUIDE test strip USE TO TEST THREE TIMES DAILY.  Marland Kitchen acetaminophen (TYLENOL) 325 MG tablet Take 2 tablets (650 mg total) by mouth every 6 (six) hours as needed for mild pain, fever or headache (or Fever >/= 101).  Marland Kitchen amLODipine (NORVASC) 10 MG tablet TAKE ONE TABLET BY MOUTH  ONCE DAILY FOR BLOOD PRESSURE.  . cholecalciferol (VITAMIN D) 1000 UNITS tablet Take 1,000 Units by mouth daily.  . cloNIDine (CATAPRES) 0.3 MG tablet TAKE (1) TABLET BY MOUTH 3 TIMES DAILY.  Marland Kitchen ELIQUIS 5 MG TABS tablet TAKE 1 TABLET BY MOUTH TWICE DAILY.  . fluticasone (FLONASE) 50 MCG/ACT nasal spray Place 2 sprays into both nostrils daily as needed. (Patient taking differently: Place 2 sprays into both nostrils daily as needed for allergies or rhinitis.)  . gabapentin (NEURONTIN) 100 MG capsule Take 1 capsule (100 mg total) by mouth 2 (two) times daily.  Marland Kitchen glipiZIDE (GLUCOTROL XL) 2.5 MG 24 hr tablet Take 1 tablet (2.5 mg total) by mouth daily with breakfast.  . insulin glargine (LANTUS) 100 UNIT/ML Solostar Pen Inject 60 Units into the skin daily.  Marland Kitchen lisinopril (ZESTRIL) 20 MG tablet TAKE 1 TABLET BY MOUTH ONCE A DAY.  . pantoprazole (PROTONIX) 40 MG tablet Take 1 tablet (40 mg total) by mouth daily.  . polyethylene glycol (MIRALAX / GLYCOLAX) 17 g packet Take 17 g by mouth daily.  . pravastatin (PRAVACHOL) 40 MG tablet TAKE ONE TABLET BY MOUTH ONCE DAILY.  . ramipril (ALTACE) 10 MG capsule TAKE (1) CAPSULE BY MOUTH ONCE DAILY.  Marland Kitchen senna-docusate (SENOKOT-S) 8.6-50 MG tablet Take 2 tablets by mouth 2 (two) times daily.  Marland Kitchen spironolactone-hydrochlorothiazide (ALDACTAZIDE) 25-25 MG tablet TAKE ONE TABLET BY MOUTH ONCE DAILY.    Objective: BP (!) 175/79   Pulse 95   Ht 5\' 6"  (1.676 m)   Wt 216 lb (98 kg)   BMI 34.86 kg/m   Physical Exam:  General: Alert female, no acute distress. Gait: Normal  Evaluation of the left wrist and hand demonstrates mild diffuse swelling.  There is no redness around the wrist.  There is no warmth appreciated.  She does have some swelling and deformity to the DIP to all fingers, most prominent at the ring finger.  In particular, the ring finger DIP is very stiff with obvious nodules on the dorsal aspect of her finger.  She tolerates extension to 50 degrees with  minimal discomfort.  She does have some pain with flexion beyond 20 degrees of the wrist.  Grip strength is 4/5 in the left hand compared to the right.  Sensation is intact distally throughout.    IMAGING: I personally reviewed images previously obtained in clinic   X-rays of the left hand were reviewed and demonstrates  no obvious deformity about the wrist.  There is degenerative changes noted to the DIP joints.  New Medications:  No orders of the defined types were placed in this encounter.     Mordecai Rasmussen, MD  08/08/2020 1:06 PM

## 2020-08-11 ENCOUNTER — Other Ambulatory Visit: Payer: Self-pay | Admitting: Internal Medicine

## 2020-08-11 ENCOUNTER — Telehealth: Payer: Self-pay | Admitting: Orthopedic Surgery

## 2020-08-11 NOTE — Telephone Encounter (Signed)
Patient called Dr. Moshe Cipro office and they said she needs to follow up with who she saw here at our office.  She was seen by Dr. Amedeo Kinsman on Friday 08/08/20 and Dr. Moshe Cipro office told the patient to call us back.  She can't bend her hand at all and she is in pain!     Please call her back at [phone]

## 2020-08-11 NOTE — Telephone Encounter (Signed)
I called the patient and she reports that her hand is feeling fine and she is having more pain in her wrist. The patient is requesting further imaging for her wrist.  She is going to call back for an appointment.

## 2020-08-12 ENCOUNTER — Encounter: Payer: Self-pay | Admitting: Orthopedic Surgery

## 2020-08-12 ENCOUNTER — Ambulatory Visit: Payer: Medicare Other | Admitting: Orthopedic Surgery

## 2020-08-12 ENCOUNTER — Other Ambulatory Visit: Payer: Self-pay

## 2020-08-12 VITALS — BP 147/77 | HR 118 | Ht 66.0 in | Wt 216.0 lb

## 2020-08-12 DIAGNOSIS — M25532 Pain in left wrist: Secondary | ICD-10-CM | POA: Diagnosis not present

## 2020-08-12 NOTE — Progress Notes (Signed)
Orthopaedic Clinic Return  Assessment: Kathleen [name] is a 70 y.o. female with the following: Left wrist pain  Plan: Patient return to clinic, less than a week from her previous visit.  She was fitted for a wrist brace at the last visit, and this is helped with some of her pain.  The pain is primarily located in her wrist.  There is no associated redness or swelling.  The prednisone helped with some of her symptoms, but she is no longer taking this.  Based on her radiographs, and her physical exam, it is difficult to accurately discern what is causing her pain.  This may be associated with some degenerative changes within her left wrist, although she has not had an injury in the past.  In addition, could be related to gout, although she has never had gout in the past.  After discussing potential treatment options, the patient is interested in a steroid injection.  In addition, we will attempt to aspirate the wrist, and if successful send the fluid for evaluation.  However, we were unable to aspirate joint fluid, but she did receive a steroid injection.  She can follow-up in clinic as needed.  If she has any issues in the future, I have asked her to contact the clinic.  Procedure note injection -left wrist aspiration and injection  Verbal consent was obtained to aspirate and inject the left wrist joint  Timeout was completed to confirm the site of injection.  The skin was prepped with alcohol and ethyl chloride was sprayed at the injection site.  A 21-gauge needle was inserted into the wrist joint, and redirected appropriately.  After several attempts, we were unable to aspirate fluid from the wrist joint.  Using the same needle, we injected 6 mg of Betamethasone and 1% lidocaine (3 cc) into the Left wrist using a dorsal approach.  There were no complications. A sterile bandage was applied.  Follow-up: Return if symptoms worsen or fail to improve.   Subjective:  Chief Complaint  Patient presents  with  . Wrist Pain    Lt wrist pain     History of Present Illness: Kathleen [name] is a 70 y.o. female who returns to clinic today for repeat evaluation of her left wrist.  She was seen in clinic last week, with concerns about swelling.  At that time, she did have some left wrist pain.  As a result, I provided her with a removable wrist splint.  She has been using the splint, with which has helped with some of her pain and discomfort.  The swelling she previously had in her hand has significantly improved.  When she was taking prednisone, her symptoms were improved.  However, the prednisone is done, and the pain has worsened.  She is left-handed, and finds it difficult to use her left hand due to the pain in her wrist.  Review of Systems: No fevers or chills No numbness or tingling No chest pain No shortness of breath No bowel or bladder dysfunction No GI distress No headaches    Objective: BP (!) 147/77   Pulse (!) 118   Ht 5\' 6"  (1.676 m)   Wt 216 lb (98 kg)   BMI 34.86 kg/m   Physical Exam:  Evaluation of the left wrist and hand demonstrates mild diffuse swelling.  There is no redness around the wrist.  There is no warmth appreciated.  She does have some swelling and deformity to the DIP to all fingers, most prominent  at the ring finger.  In particular, the ring finger DIP is very stiff with obvious nodules on the dorsal aspect of her finger.  She tolerates extension to 50 degrees with minimal discomfort.  She does have some pain with flexion beyond 20 degrees of the wrist.  Grip strength is 4/5 in the left hand compared to the right.  Sensation is intact distally throughout.  IMAGING: I personally ordered and reviewed the following images:   No new imaging obtained today.  Kathleen Rasmussen, MD 08/12/2020 4:51 PM

## 2020-08-12 NOTE — Patient Instructions (Signed)

## 2020-08-14 ENCOUNTER — Telehealth: Payer: Self-pay | Admitting: Orthopedic Surgery

## 2020-08-14 NOTE — Telephone Encounter (Signed)
Left VM for pt to call back.

## 2020-08-14 NOTE — Telephone Encounter (Signed)
Patient called and left a voicemail stating she wants to go to [facility] for a CT of her hand/wrist.  She said she is still hurting and wants to know what is going on .  She thinks a CT will tell us what is going on .    Would you call her and speak with her regarding this?  Thanks so much

## 2020-08-15 ENCOUNTER — Other Ambulatory Visit: Payer: Self-pay

## 2020-08-15 ENCOUNTER — Telehealth: Payer: Self-pay

## 2020-08-15 DIAGNOSIS — E1159 Type 2 diabetes mellitus with other circulatory complications: Secondary | ICD-10-CM

## 2020-08-15 MED ORDER — BLOOD GLUCOSE METER KIT: PACK | 3 refills | Status: DC

## 2020-08-15 NOTE — Telephone Encounter (Signed)
Rx sent in

## 2020-08-15 NOTE — Telephone Encounter (Signed)
Pt called back stating the pain in her wrist is becoming unbearable. States she's not able to do ADL with that hand and the shot did not help at all. Wants to get an MRI/CT because her daughter told her it could be carpal tunnel. I asked pt if she's contacted her PCP anymore and was told that they referred her back to [place] and that her actual PCP is out of office at this time. Please advise on next steps.

## 2020-08-15 NOTE — Telephone Encounter (Signed)
Spoke with pt and explained information from Dr. Amedeo Kinsman, pt verbalized understanding and states she will give the injection a little more time to work. Let pt know I will give her a call next week to check her status.

## 2020-08-15 NOTE — Telephone Encounter (Signed)
Patient called states she is needing a new meter to check her blood sugar hers isnt working anymore ph# [phone]

## 2020-08-15 NOTE — Telephone Encounter (Signed)
Thank you  Unfortunately, the steroid in the injection can take a few days to start to work.  IN ~25% of cases, it can make the pain worse for a few days before the steroid improves the pain, that is how this medications works.  Based on her symptoms and presentation in clinic, I am not concerned about carpal tunnel syndrome.  Rarely does this cause significant pain and swelling.  This primarily causes numbness and tingling, some weakness if it is a chronic problem.    At this point, I do not think that a CT scan or an MRI will provide additional information.  Based on the XR, she may have some arthritis, but it is not severe.  CT scan evaluates bones in great detail.  She does not have a specific injury like a fall, so I do not think we have missed a fracture.  MRI provides detail about soft tissue like ligaments or tendons.  Again, no specific injury, so I do not think an MRI will give [place] more information.   I do think her pain will progressively improve following the injection, it just takes time.   Thanks [employer]

## 2020-08-19 NOTE — Telephone Encounter (Signed)
Attempted to call pt to check status. No answer and no VM.

## 2020-08-20 NOTE — Telephone Encounter (Signed)
Patient states that she had gone to the store when you called yesterday.  She asks that you call her again today.  Thanks

## 2020-08-21 NOTE — Telephone Encounter (Signed)
Called pt and states pain isn't constant anymore and pain subsides with ibuprofen, but pain is still in the same area. Has been told that she shouldn't take ibuprofen. Would like to know if you have anymore recommendation.

## 2020-08-28 ENCOUNTER — Ambulatory Visit: Payer: Medicare Other | Admitting: Nurse Practitioner

## 2020-08-28 ENCOUNTER — Encounter: Payer: Self-pay | Admitting: Internal Medicine

## 2020-08-28 NOTE — Progress Notes (Deleted)
Primary Care Physician:  Fayrene Helper, MD Primary Gastroenterologist:  Dr. Abbey Chatters  No chief complaint on file.   HPI:   Kathleen [name] is a 70 y.o. female who presents to schedule colonoscopy.  Triage/nurse visit deferred to office visit due to medications likely necessitating augmented sedation.  Previous colonoscopy completed 11/27/2009 which found moderate sigmoid: Diverticulosis, moderate internal hemorrhoids.  Recommended repeat colonoscopy in 10 years (2021).  Today she states she is doing okay overall.  The patient is currently on Eliquis.  Past Medical History:  Diagnosis Date  . Acute respiratory failure with hypoxia ([place]) 10/22/2019  . Anemia   . Arthritis   . Chronic kidney disease   . Diabetes mellitus type II   . Dysphagia    unspecified   . GERD (gastroesophageal reflux disease)   . Hyperlipidemia 2000  . Hypertension 1995  . Insomnia   . Low back pain   . Obesity   . Sleep apnea in adult 11/25/2019  . Thyroid nodule 11/25/2019    Past Surgical History:  Procedure Laterality Date  . Carpal tunnel release     left   . CHOLECYSTECTOMY  2007  . DIGIT NAIL REMOVAL  08/2011  . KNEE SURGERY  1.20.2012   arthroscopy LEFT knee partial medial meniscectomy  . TENOTOMY     2,3,4  left foot   . toenail removal    . TUBAL LIGATION      Current Outpatient Medications  Medication Sig Dispense Refill  . ACCU-CHEK GUIDE test strip USE TO TEST THREE TIMES DAILY. 100 strip 0  . acetaminophen (TYLENOL) 325 MG tablet Take 2 tablets (650 mg total) by mouth every 6 (six) hours as needed for mild pain, fever or headache (or Fever >/= 101). 12 tablet 0  . amLODipine (NORVASC) 10 MG tablet TAKE ONE TABLET BY MOUTH ONCE DAILY FOR BLOOD PRESSURE. 90 tablet 0  . blood glucose meter kit and supplies Dispense based on patient and insurance preference. Use up to four times daily as directed. (FOR ICD-10 E10.9, E11.9). 1 each 3  . cholecalciferol (VITAMIN D) 1000 UNITS  tablet Take 1,000 Units by mouth daily.    . cloNIDine (CATAPRES) 0.3 MG tablet TAKE (1) TABLET BY MOUTH 3 TIMES DAILY. 270 tablet 0  . ELIQUIS 5 MG TABS tablet TAKE 1 TABLET BY MOUTH TWICE DAILY. 60 tablet 0  . fluticasone (FLONASE) 50 MCG/ACT nasal spray Place 2 sprays into both nostrils daily as needed. (Patient taking differently: Place 2 sprays into both nostrils daily as needed for allergies or rhinitis.) 15 g 1  . gabapentin (NEURONTIN) 100 MG capsule Take 1 capsule (100 mg total) by mouth 2 (two) times daily. 60 capsule 5  . glipiZIDE (GLUCOTROL XL) 2.5 MG 24 hr tablet Take 1 tablet (2.5 mg total) by mouth daily with breakfast. 30 tablet 3  . insulin glargine (LANTUS) 100 UNIT/ML Solostar Pen Inject 60 Units into the skin daily. 15 mL 11  . lisinopril (ZESTRIL) 20 MG tablet TAKE 1 TABLET BY MOUTH ONCE A DAY. 30 tablet 0  . pantoprazole (PROTONIX) 40 MG tablet Take 1 tablet (40 mg total) by mouth daily. 30 tablet 3  . polyethylene glycol (MIRALAX / GLYCOLAX) 17 g packet Take 17 g by mouth daily. 30 each 1  . pravastatin (PRAVACHOL) 40 MG tablet TAKE ONE TABLET BY MOUTH ONCE DAILY. 30 tablet 0  . predniSONE (DELTASONE) 20 MG tablet Take 2 tablets (40 mg total) by mouth daily with  breakfast. 5 tablet 0  . ramipril (ALTACE) 10 MG capsule TAKE (1) CAPSULE BY MOUTH ONCE DAILY. 90 capsule 0  . senna-docusate (SENOKOT-S) 8.6-50 MG tablet Take 2 tablets by mouth 2 (two) times daily. 120 tablet 2  . spironolactone-hydrochlorothiazide (ALDACTAZIDE) 25-25 MG tablet TAKE ONE TABLET BY MOUTH ONCE DAILY. 90 tablet 0   No current facility-administered medications for this visit.    Allergies as of 08/28/2020  . (No Known Allergies)    Family History  Problem Relation Age of Onset  . Diabetes Mother   . Hypertension Mother        MI  . Heart disease Mother   . Kidney disease Mother 42       dialysis  . Diabetes Sister   . Diabetes Brother   . Diabetes Brother   . Kidney disease Brother   .  Dementia Sister   . Diabetes Father     Social History   Socioeconomic History  . Marital status: Divorced    Spouse name: Not on file  . Number of children: 4  . Years of education: Not on file  . Highest education level: 11th grade  Occupational History  . Occupation: Full time Engineer, petroleum   Tobacco Use  . Smoking status: Never Smoker  . Smokeless tobacco: Never Used  Vaping Use  . Vaping Use: Never used  Substance and Sexual Activity  . Alcohol use: No  . Drug use: No  . Sexual activity: Yes  Other Topics Concern  . Not on file  Social History Narrative   Lives with 2nd oldest son   Social Determinants of Health   Financial Resource Strain: Low Risk   . Difficulty of Paying Living Expenses: Not hard at all  Food Insecurity: No Food Insecurity  . Worried About Charity fundraiser in the Last Year: Never true  . Ran Out of Food in the Last Year: Never true  Transportation Needs: No Transportation Needs  . Lack of Transportation (Medical): No  . Lack of Transportation (Non-Medical): No  Physical Activity: Insufficiently Active  . Days of Exercise per Week: 2 days  . Minutes of Exercise per Session: 10 min  Stress: No Stress Concern Present  . Feeling of Stress : Not at all  Social Connections: Moderately Isolated  . Frequency of Communication with Friends and Family: More than three times a week  . Frequency of Social Gatherings with Friends and Family: More than three times a week  . Attends Religious Services: More than 4 times per year  . Active Member of Clubs or Organizations: No  . Attends Archivist Meetings: Never  . Marital Status: Divorced  Human resources officer Violence: Not on file    Subjective:*** Review of Systems  Constitutional: Negative for chills, fever, malaise/fatigue and weight loss.  HENT: Negative for congestion and sore throat.   Respiratory: Negative for cough and shortness of breath.   Cardiovascular: Negative for chest pain  and palpitations.  Gastrointestinal: Negative for abdominal pain, blood in stool, diarrhea, melena, nausea and vomiting.  Musculoskeletal: Negative for joint pain and myalgias.  Skin: Negative for rash.  Neurological: Negative for dizziness and weakness.  Endo/Heme/Allergies: Does not bruise/bleed easily.  Psychiatric/Behavioral: Negative for depression. The patient is not nervous/anxious.   All other systems reviewed and are negative.      Objective: There were no vitals taken for this visit. Physical Exam Vitals and nursing note reviewed.  Constitutional:      General: She  is not in acute distress.    Appearance: Normal appearance. She is well-developed. She is not ill-appearing, toxic-appearing or diaphoretic.  HENT:     Head: Normocephalic and atraumatic.     Nose: No congestion or rhinorrhea.  Eyes:     General: No scleral icterus. Cardiovascular:     Rate and Rhythm: Normal rate and regular rhythm.     Heart sounds: Normal heart sounds.  Pulmonary:     Effort: Pulmonary effort is normal. No respiratory distress.     Breath sounds: Normal breath sounds.  Abdominal:     General: Bowel sounds are normal.     Palpations: Abdomen is soft. There is no hepatomegaly, splenomegaly or mass.     Tenderness: There is no abdominal tenderness. There is no guarding or rebound.     Hernia: No hernia is present.  Skin:    General: Skin is warm and dry.     Coloration: Skin is not jaundiced.     Findings: No rash.  Neurological:     General: No focal deficit present.     Mental Status: She is alert and oriented to person, place, and time.  Psychiatric:        Attention and Perception: Attention normal.        Mood and Affect: Mood normal.        Speech: Speech normal.        Behavior: Behavior normal.        Thought Content: Thought content normal.        Cognition and Memory: Cognition and memory normal.      Assessment:  ***   Plan: ***    Thank you for allowing [place]  to participate in the care of Grant, DNP, AGNP-C Adult & Gerontological Nurse Practitioner [facility]   08/28/2020 7:41 AM   Disclaimer: This note was dictated with voice recognition software. Similar sounding words can inadvertently be transcribed and may not be corrected upon review.

## 2020-09-01 ENCOUNTER — Other Ambulatory Visit: Payer: Self-pay | Admitting: Family Medicine

## 2020-09-11 ENCOUNTER — Other Ambulatory Visit: Payer: Self-pay | Admitting: Family Medicine

## 2020-09-11 ENCOUNTER — Other Ambulatory Visit: Payer: Self-pay | Admitting: Internal Medicine

## 2020-09-11 DIAGNOSIS — E785 Hyperlipidemia, unspecified: Secondary | ICD-10-CM

## 2020-09-22 DIAGNOSIS — I5032 Chronic diastolic (congestive) heart failure: Secondary | ICD-10-CM | POA: Diagnosis not present

## 2020-09-22 DIAGNOSIS — I129 Hypertensive chronic kidney disease with stage 1 through stage 4 chronic kidney disease, or unspecified chronic kidney disease: Secondary | ICD-10-CM | POA: Diagnosis not present

## 2020-09-22 DIAGNOSIS — E1122 Type 2 diabetes mellitus with diabetic chronic kidney disease: Secondary | ICD-10-CM | POA: Diagnosis not present

## 2020-09-22 DIAGNOSIS — N189 Chronic kidney disease, unspecified: Secondary | ICD-10-CM | POA: Diagnosis not present

## 2020-09-22 DIAGNOSIS — E559 Vitamin D deficiency, unspecified: Secondary | ICD-10-CM | POA: Diagnosis not present

## 2020-09-24 ENCOUNTER — Encounter: Payer: Medicare Other | Admitting: Family Medicine

## 2020-09-25 DIAGNOSIS — I129 Hypertensive chronic kidney disease with stage 1 through stage 4 chronic kidney disease, or unspecified chronic kidney disease: Secondary | ICD-10-CM | POA: Diagnosis not present

## 2020-09-25 DIAGNOSIS — E559 Vitamin D deficiency, unspecified: Secondary | ICD-10-CM | POA: Diagnosis not present

## 2020-09-25 DIAGNOSIS — I5032 Chronic diastolic (congestive) heart failure: Secondary | ICD-10-CM | POA: Diagnosis not present

## 2020-09-25 DIAGNOSIS — E1122 Type 2 diabetes mellitus with diabetic chronic kidney disease: Secondary | ICD-10-CM | POA: Diagnosis not present

## 2020-09-25 DIAGNOSIS — D472 Monoclonal gammopathy: Secondary | ICD-10-CM | POA: Diagnosis not present

## 2020-09-25 DIAGNOSIS — N189 Chronic kidney disease, unspecified: Secondary | ICD-10-CM | POA: Diagnosis not present

## 2020-09-25 DIAGNOSIS — D638 Anemia in other chronic diseases classified elsewhere: Secondary | ICD-10-CM | POA: Diagnosis not present

## 2020-09-29 ENCOUNTER — Other Ambulatory Visit: Payer: Self-pay | Admitting: *Deleted

## 2020-09-29 ENCOUNTER — Other Ambulatory Visit: Payer: Self-pay | Admitting: Family Medicine

## 2020-09-29 MED ORDER — ACCU-CHEK GUIDE VI STRP: ORAL_STRIP | 0 refills | Status: DC

## 2020-10-07 ENCOUNTER — Other Ambulatory Visit: Payer: Self-pay | Admitting: Family Medicine

## 2020-10-20 ENCOUNTER — Other Ambulatory Visit: Payer: Self-pay | Admitting: Family Medicine

## 2020-10-20 ENCOUNTER — Other Ambulatory Visit: Payer: Self-pay | Admitting: Internal Medicine

## 2020-10-20 DIAGNOSIS — E785 Hyperlipidemia, unspecified: Secondary | ICD-10-CM

## 2020-11-17 ENCOUNTER — Encounter: Payer: Self-pay | Admitting: Family Medicine

## 2020-11-17 ENCOUNTER — Ambulatory Visit (INDEPENDENT_AMBULATORY_CARE_PROVIDER_SITE_OTHER): Payer: Medicare Other | Admitting: Family Medicine

## 2020-11-17 ENCOUNTER — Other Ambulatory Visit: Payer: Self-pay

## 2020-11-17 VITALS — BP 152/78 | HR 94 | Resp 16 | Ht 66.0 in | Wt 220.0 lb

## 2020-11-17 DIAGNOSIS — M79645 Pain in left finger(s): Secondary | ICD-10-CM | POA: Diagnosis not present

## 2020-11-17 DIAGNOSIS — Z794 Long term (current) use of insulin: Secondary | ICD-10-CM | POA: Diagnosis not present

## 2020-11-17 DIAGNOSIS — I10 Essential (primary) hypertension: Secondary | ICD-10-CM | POA: Diagnosis not present

## 2020-11-17 DIAGNOSIS — E1122 Type 2 diabetes mellitus with diabetic chronic kidney disease: Secondary | ICD-10-CM

## 2020-11-17 DIAGNOSIS — E7849 Other hyperlipidemia: Secondary | ICD-10-CM | POA: Diagnosis not present

## 2020-11-17 LAB — POCT GLYCOSYLATED HEMOGLOBIN (HGB A1C): HbA1c, POC (controlled diabetic range): 7.6 % — AB (ref 0.0–7.0)

## 2020-11-17 MED ORDER — LISINOPRIL 20 MG PO TABS: 20.0000 mg | ORAL_TABLET | Freq: Every day | ORAL | 5 refills | Status: DC

## 2020-11-17 MED ORDER — RAMIPRIL 10 MG PO CAPS: ORAL_CAPSULE | ORAL | 1 refills | Status: DC

## 2020-11-17 MED ORDER — SPIRONOLACTONE-HCTZ 25-25 MG PO TABS: 1.0000 | ORAL_TABLET | Freq: Every day | ORAL | 1 refills | Status: DC

## 2020-11-17 MED ORDER — CLONIDINE HCL 0.3 MG PO TABS: ORAL_TABLET | ORAL | 1 refills | Status: DC

## 2020-11-17 MED ORDER — AMLODIPINE BESYLATE 10 MG PO TABS: ORAL_TABLET | ORAL | 1 refills | Status: DC

## 2020-11-17 MED ORDER — GLIPIZIDE ER 2.5 MG PO TB24: ORAL_TABLET | ORAL | 5 refills | Status: DC

## 2020-11-17 NOTE — Assessment & Plan Note (Signed)
Improved but not at goal, needs to take med consistently 8 hrs apart DASH diet and commitment to daily physical activity for a minimum of 30 minutes discussed and encouraged, as a part of hypertension management. The importance of attaining a healthy weight is also discussed.  BP/Weight 11/17/2020 08/12/2020 08/08/2020 08/05/2020 07/16/2020 06/26/2020 15/87/2761  Systolic BP 848 592 763 943 - - 200  Diastolic BP 78 77 79 79 - - 69  Wt. (Lbs) 220 216 216 218 218 220 225  BMI 35.51 34.86 34.86 35.19 35.19 35.51 36.32

## 2020-11-17 NOTE — Assessment & Plan Note (Signed)
Increasingly debilitating , refer Ortho 

## 2020-11-17 NOTE — Assessment & Plan Note (Signed)
Improved and controlled Kathleen [name] is reminded of the importance of commitment to daily physical activity for 30 minutes or more, as able and the need to limit carbohydrate intake to 30 to 60 grams per meal to help with blood sugar control.   The need to take medication as prescribed, test blood sugar as directed, and to call between visits if there is a concern that blood sugar is uncontrolled is also discussed.   Kathleen [name] is reminded of the importance of daily foot exam, annual eye examination, and good blood sugar, blood pressure and cholesterol control.  Diabetic Labs Latest Ref Rng & Units 11/17/2020 06/20/2020 02/05/2020 01/30/2020 10/27/2019  HbA1c 0.0 - 7.0 % 7.6(A) 9.4(H) 7.5(A) 7.3(A) -  Microalbumin mg/dL - - - - -  Micro/Creat Ratio 0 - 29 mg/g creat - <3 - - -  Chol 100 - 199 mg/dL - 111 - - -  HDL >39 mg/dL - 30(L) - - -  Calc LDL 0 - 99 mg/dL - 62 - - -  Triglycerides 0 - 149 mg/dL - 99 - - -  Creatinine 0.57 - 1.00 mg/dL - 1.66(H) - - 1.75(H)   BP/Weight 11/17/2020 08/12/2020 08/08/2020 08/05/2020 07/16/2020 06/26/2020 94/70/7615  Systolic BP 183 437 357 897 - - 847  Diastolic BP 78 77 79 79 - - 69  Wt. (Lbs) 220 216 216 218 218 220 225  BMI 35.51 34.86 34.86 35.19 35.19 35.51 36.32   Foot/eye exam completion dates Latest Ref Rng & Units 07/15/2020 03/19/2020  Eye Exam No Retinopathy No Retinopathy -  Foot Form Completion - - Done

## 2020-11-17 NOTE — Assessment & Plan Note (Signed)
Hyperlipidemia:Low fat diet discussed and encouraged.   Lipid Panel  Lab Results  Component Value Date   CHOL 111 06/20/2020   HDL 30 (L) 06/20/2020   LDLCALC 62 06/20/2020   TRIG 99 06/20/2020   CHOLHDL 3.7 06/20/2020   Needs to increase exercise Updated lab needed at/ before next visit.

## 2020-11-17 NOTE — Patient Instructions (Addendum)
Keep September appointment to f/u uncontrolled BP and get flu vaccine, call if you need me sooner.  Blood sugar has improved continue to monitor your sugar closely both with eating foods recommended taking medication as prescribed and testing regularly. You are referred to Orthopedics re finger pain  Blood pressure is high today.  Please make sure that you take your clonidine at 6 in the morning 2 in the afternoon and 10 at night.important to take on regular schedule  Nurse to  obtain Cologuard which patient reports she has had in the past 2 years so that the gap  in colon cancer screening is closed.  Very important that you get your COVID booster, please get this today.  You need Shingrix vaccines and should ask about these when you go for your COVID booster.  Start your Shingrix vaccinations approximately 2 to 3 weeks after your COVID booster.  Thanks for choosing [facility], we consider it a privelige to serve you.

## 2020-11-17 NOTE — Progress Notes (Signed)
Kathleen [name]     MRN: [id number]      DOB: [birth year]   HPI Kathleen [name] is here for follow up and re-evaluation of chronic medical conditions, medication management and review of any available recent lab and radiology data.  Preventive health is updated, specifically  Cancer screening and Immunization.   C/o swelling , pain and reduced ROM of left ring finger, no inciting trauma, wrist which she saw Ortho for as improved Denies polyuria, polydipsia, blurred vision , or hypoglycemic episodes.  The PT denies any adverse reactions to current medications since the last visit.  There are no new concerns.  There are no specific complaints   ROS Denies recent fever or chills. Denies sinus pressure, nasal congestion, ear pain or sore throat. Denies chest congestion, productive cough or wheezing. Denies chest pains, palpitations and leg swelling Denies abdominal pain, nausea, vomiting,diarrhea or constipation.   Denies dysuria, frequency, hesitancy or incontinence. . Denies headaches, seizures, numbness, or tingling. Denies depression, anxiety or insomnia. Denies skin break down or rash.   PE  BP (!) 152/78   Pulse 94   Resp 16   Ht 5\' 6"  (1.676 m)   Wt 220 lb (99.8 kg)   SpO2 95%   BMI 35.51 kg/m   Patient alert and oriented and in no cardiopulmonary distress.  HEENT: No facial asymmetry, EOMI,     Neck supple .  Chest: Clear to auscultation bilaterally.  CVS: S1, S2 no murmurs, no S3.Regular rate.  ABD: Soft non tender.   Ext: No edema  MS: Adequate ROM spine, shoulders, hips and knees.  Skin: Intact, no ulcerations or rash noted.  Psych: Good eye contact, normal affect. Memory intact not anxious or depressed appearing.  CNS: CN 2-12 intact, power,  normal throughout.no focal deficits noted.   Assessment & Plan  Pain in finger of left hand Refer ortho  Essential hypertension Improved but not at goal, needs to take med consistently 8 hrs apart DASH diet  and commitment to daily physical activity for a minimum of 30 minutes discussed and encouraged, as a part of hypertension management. The importance of attaining a healthy weight is also discussed.  BP/Weight 11/17/2020 08/12/2020 08/08/2020 08/05/2020 07/16/2020 06/26/2020 74/25/9563  Systolic BP 875 643 329 518 - - 841  Diastolic BP 78 77 79 79 - - 69  Wt. (Lbs) 220 216 216 218 218 220 225  BMI 35.51 34.86 34.86 35.19 35.19 35.51 36.32       Type 2 diabetes mellitus (HCC) Improved and controlled Kathleen [name] is reminded of the importance of commitment to daily physical activity for 30 minutes or more, as able and the need to limit carbohydrate intake to 30 to 60 grams per meal to help with blood sugar control.   The need to take medication as prescribed, test blood sugar as directed, and to call between visits if there is a concern that blood sugar is uncontrolled is also discussed.   Kathleen [name] is reminded of the importance of daily foot exam, annual eye examination, and good blood sugar, blood pressure and cholesterol control.  Diabetic Labs Latest Ref Rng & Units 11/17/2020 06/20/2020 02/05/2020 01/30/2020 10/27/2019  HbA1c 0.0 - 7.0 % 7.6(A) 9.4(H) 7.5(A) 7.3(A) -  Microalbumin mg/dL - - - - -  Micro/Creat Ratio 0 - 29 mg/g creat - <3 - - -  Chol 100 - 199 mg/dL - 111 - - -  HDL >39 mg/dL - 30(L) - - -  Calc  LDL 0 - 99 mg/dL - 62 - - -  Triglycerides 0 - 149 mg/dL - 99 - - -  Creatinine 0.57 - 1.00 mg/dL - 1.66(H) - - 1.75(H)   BP/Weight 11/17/2020 08/12/2020 08/08/2020 08/05/2020 07/16/2020 06/26/2020 79/15/0569  Systolic BP 794 801 655 374 - - 827  Diastolic BP 78 77 79 79 - - 69  Wt. (Lbs) 220 216 216 218 218 220 225  BMI 35.51 34.86 34.86 35.19 35.19 35.51 36.32   Foot/eye exam completion dates Latest Ref Rng & Units 07/15/2020 03/19/2020  Eye Exam No Retinopathy No Retinopathy -  Foot Form Completion - - Done        Morbid obesity ([place]) Patient re-educated about  the importance of  commitment to a  minimum of 150 minutes of exercise per week as able.  The importance of healthy food choices with portion control discussed, as well as eating regularly and within a 12 hour window most days. The need to choose "clean , green" food 50 to 75% of the time is discussed, as well as to make water the primary drink and set a goal of 64 ounces water daily.    Weight /BMI 11/17/2020 08/12/2020 08/08/2020  WEIGHT 220 lb 216 lb 216 lb  HEIGHT 5\' 6"  5\' 6"  5\' 6"   BMI 35.51 kg/m2 34.86 kg/m2 34.86 kg/m2      Hyperlipidemia Hyperlipidemia:Low fat diet discussed and encouraged.   Lipid Panel  Lab Results  Component Value Date   CHOL 111 06/20/2020   HDL 30 (L) 06/20/2020   LDLCALC 62 06/20/2020   TRIG 99 06/20/2020   CHOLHDL 3.7 06/20/2020   Needs to increase exercise Updated lab needed at/ before next visit.

## 2020-11-17 NOTE — Assessment & Plan Note (Addendum)
Patient re-educated about  the importance of commitment to a  minimum of 150 minutes of exercise per week as able.  The importance of healthy food choices with portion control discussed, as well as eating regularly and within a 12 hour window most days. The need to choose "clean , green" food 50 to 75% of the time is discussed, as well as to make water the primary drink and set a goal of 64 ounces water daily.    Weight /BMI 11/17/2020 08/12/2020 08/08/2020  WEIGHT 220 lb 216 lb 216 lb  HEIGHT 5\' 6"  5\' 6"  5\' 6"   BMI 35.51 kg/m2 34.86 kg/m2 34.86 kg/m2

## 2020-11-19 ENCOUNTER — Other Ambulatory Visit: Payer: Self-pay | Admitting: Family Medicine

## 2020-11-19 ENCOUNTER — Other Ambulatory Visit: Payer: Self-pay

## 2020-11-19 ENCOUNTER — Telehealth: Payer: Self-pay

## 2020-11-19 ENCOUNTER — Other Ambulatory Visit: Payer: Self-pay | Admitting: Internal Medicine

## 2020-11-19 DIAGNOSIS — E785 Hyperlipidemia, unspecified: Secondary | ICD-10-CM

## 2020-11-19 MED ORDER — ACCU-CHEK GUIDE VI STRP: ORAL_STRIP | 11 refills | Status: DC

## 2020-11-19 NOTE — Telephone Encounter (Signed)
Patient called said [facility] has not yet received test strips glucose blood (ACCU-CHEK GUIDE) test strip

## 2020-11-19 NOTE — Telephone Encounter (Signed)
Resent to the pharmacy with dx code

## 2020-11-20 ENCOUNTER — Telehealth: Payer: Self-pay | Admitting: Internal Medicine

## 2020-11-20 ENCOUNTER — Other Ambulatory Visit: Payer: Self-pay

## 2020-11-20 NOTE — Telephone Encounter (Signed)
Pt stated that she is requesting a refill for; Eliquis; stated that she has contacted her pharmacy and informed her there was no more refills for the medication and she was just wanting to check and see if it may be sent in for her.  Pharmacy; [facility], [place] regard; [phone]

## 2020-11-20 NOTE — Telephone Encounter (Signed)
Called and spoke with pt and she is aware that per MW he will call her after clinic tomorrow--around 3:30 to speak with her about the eliquis.  Nothing further is needed.

## 2020-11-20 NOTE — Telephone Encounter (Signed)
Needs a televisit at the very least to decide, ok to add on to tomorrow and I'll call her when done in office

## 2020-11-20 NOTE — Telephone Encounter (Signed)
Called and spoke with patient regarding Eliquis refill. Patient states she is wanting to know if she needs to continue with medication. I advised patient that it is going on a year since we saw her. Told patient I would send a message to Dr. Melvyn Novas and call her back with his recs.  Dr. Melvyn Novas please advise  Thank you

## 2020-11-21 ENCOUNTER — Encounter: Payer: Self-pay | Admitting: Internal Medicine

## 2020-11-21 ENCOUNTER — Ambulatory Visit (INDEPENDENT_AMBULATORY_CARE_PROVIDER_SITE_OTHER): Payer: Medicare Other | Admitting: Internal Medicine

## 2020-11-21 ENCOUNTER — Other Ambulatory Visit: Payer: Self-pay

## 2020-11-21 DIAGNOSIS — I2699 Other pulmonary embolism without acute cor pulmonale: Secondary | ICD-10-CM

## 2020-11-21 NOTE — Assessment & Plan Note (Addendum)
Dx 10/22/19 p car trip to [place] and back  - CTa 10/22/19 large right-sided submassive PE and small left upper lobe PE - ECho 10/22/19 Right ventricular systolic function is mildly reduced. The right  ventricular size is mildly enlarged.  Right atrial size was mildly dilated.  - LEvenous Dopplers 10/22/19  without DVT - Echo 02/01/20  Grade I  diastolic dysfunction, nl RV with RA pressure low could not determine PAS    - ok to d/c eliquis 11/21/2020  - referred to hematology for long term risk assessment rel  prior PE, MO and relatively sedentary secondary to DJD  Discussed in detail all the  indications, usual  risks and alternatives  relative to the benefits with patient who agrees to proceed with w/u as outlined.     Each maintenance medication was reviewed in detail including most importantly the difference between maintenance and as needed and under what circumstances the prns are to be used.  Please see AVS for specific  Instructions which are unique to this visit and I personally typed out  which were reviewed in detail over the phone with the patient and a copy provided.

## 2020-11-21 NOTE — Progress Notes (Signed)
Kathleen [name], female    DOB: [birth year], .   MRN: [id number]   Brief patient profile:     Admission date:  10/22/2019      Discharge Date:  10/28/2019    Primary MD  Fayrene Helper, MD   Recommendations for primary care physician for things to follow:    1)Avoid ibuprofen/Advil/Aleve/Motrin/Goody Powders/Naproxen/BC powders/Meloxicam/Diclofenac/Indomethacin and other Nonsteroidal anti-inflammatory medications as these will make you more likely to bleed and can cause stomach ulcers, can also cause Kidney problems.    2)insulin Lispro (HumaLOG) injection 0-10 Units 0-10 Units Subcutaneous, 3 times daily with meals CBG < 70: Implement Hypoglycemia Standing Orders and refer to Hypoglycemia Standing Orders sidebar report  CBG 70 - 120: 0 unit CBG 121 - 150: 0 unit  CBG 151 - 200: 1 unit CBG 201 - 250: 2 units CBG 251 - 300: 4 units CBG 301 - 350: 6 units  CBG 351 - 400: 8 units  CBG > 400: 10 units   3)Continue oxygen at 2L/min continously   4)CBC and BMP every Wednesday 10/31/19   5)outpatient thyroid biopsy--- due to thyroid nodule--this can be done over the next 2 to 3 months   6) outpatient sleep study due to concerns about possible obstructive sleep apnea--you may need CPAP machine   7)2.7 x 2.1 cm rounded well-defined low density is noted in the left lower lobe --patient will need outpatient PET scan     Admission Diagnosis  Thyroid nodule [E04.1] Lung mass [R91.8] Pulmonary embolism (HCC) [I26.99] Pulmonary emboli (HCC) [I26.99] Other acute pulmonary embolism with acute cor pulmonale (HCC) [I26.09]     Discharge Diagnosis  Thyroid nodule [E04.1] Lung mass [R91.8] Pulmonary embolism (HCC) [I26.99] Pulmonary emboli (HCC) [I26.99] Other acute pulmonary embolism with acute cor pulmonale (HCC) [I26.09]     Principal Problem:   Pulmonary embolism (HCC) Active Problems:   Acute respiratory failure with hypoxia (HCC)   CKD (chronic kidney disease), stage IIIb   Type  2 diabetes mellitus with vascular disease ([place])   Morbid obesity ([place])   Essential hypertension   GERD           Past Medical History:  Diagnosis Date   Anemia     Arthritis     Chronic kidney disease     Diabetes mellitus type II     Dysphagia      unspecified    GERD (gastroesophageal reflux disease)     Hyperlipidemia 2000   Hypertension 1995   Insomnia     Low back pain     Obesity             Past Surgical History:  Procedure Laterality Date   Carpal tunnel release        left    CHOLECYSTECTOMY   2007   DIGIT NAIL REMOVAL   08/2011   KNEE SURGERY   1.20.2012    arthroscopy LEFT knee partial medial meniscectomy   TENOTOMY        2,3,4  left foot    toenail removal       TUBAL LIGATION               HPI  from the history and physical done on the day of admission:      Kathleen [name]  is a 70 y.o. female  With PMHx relevant for CKD IIIb, HTN, DM, HLD, GERD, obesity, OA presents to the ED with worsening shortness of breath, worsening DOE  and was  found to be hypoxic in the ED --Patient apparently was having DOE and shob as well as leg pains over last couple of week, took a 4 hr trip to [employer] arriving at the beach she had a hard time walking from the elevator to her hotel room--- shob  and DOE continue to worsen-- --today in the ED she is found to have hypoxia with elevated D-dimer and tachycardia and CTA chest consistent with large right-sided PE and possible small left upper lobe PE -No frank chest pains no palpitations no dyspnea, specifically denies pleuritic type symptoms -Additional history obtained from her sister bedside -Patient's younger sister who is 2 years younger recently diagnosed with stage IV pancreatic cancer     CTA Chest  IMPRESSION: 1). Large right-sided pulmonary emboli are noted with CT evidence of right heart strain (RV/LV Ratio = 1.0) consistent with at least submassive (intermediate risk) PE. The presence of right heart strain  has been associated with an increased risk of morbidity and mortality.  2) 2.6 cm left thyroid nodule is noted. Recommend thyroid US.  3) 2.7 x 2.1 cm rounded well-defined low density is noted in the left lower lobe with average Hounsfield measurement of 40. It is uncertain if this represents possible complex pericardial cyst or mass. PET scan is recommended for further evaluation.   --Troponin is 47, repeat is 42 BNP is 159 hgb 11.6 Creatinine is 1.86 with recent baseline usually around 1.5-1.6 -D-dimer 19.18        Hospital Course:      Brief Summary:- 70 y.o. female  With PMHx relevant for CKD IIIb, HTN, DM, HLD, GERD, obesity, OA presents to the ED with worsening shortness of breath, worsening DOE and hypoxia subsequently admitted on 10/22/2019 with acute PE     A/p   1)Acute Bil PE----large right-sided submassive PE  and small left upper lobe PE--- Echo with preserved EF of 65 to 70% with  significant right heart strain on echo -LE venous Dopplers without DVT -Patient may benefit from abdominal pelvic imaging -Patient's SHOB and DOE started prior to trip to the beach, symptoms worsened immediately after she arrived at the beach hotel could even walk up from the elevator to her hotel room --This PE may have been present prior to patient's for a trip to the beach symptoms only worsened on that particular day rather than started that day -Patient will need malignancy work-up as part of work-up of a hypercoagulable state -Last colonoscopy more than 10 years ago apparently,  last mammogram 2021 -CTA chest also showed 2.7 x 2.1 cm rounded well-defined low density is noted in the left lower lobe   Initially treated with IV heparin drip  transitioned to [place] on 10/25/19 --Hypoxia persist , unable to wean off oxygen at this time -continue supplemental oxygen   2)CKD IIIb--Creatinine is down to 1.7 from 1.86 on admission --recent baseline usually around 1.5-1.6 -patient had contrast/CTA  study continue to hold Aldactone/hctz, hold ramipril -- renally adjust medications, avoid nephrotoxic agents / dehydration  / hypotension   - 3)DM2- A1c 8.3 , reflecting uncontrolled diabetes PTA had hypoglycemic episode in the ED, -PTA was on Lantus 60 mg twice daily, changed to 40 mg daily for now along with sliding scale coverage   4)HTN-continue amlodipine and clonidine ---hold ramipril and Aldactone/HCTZ due to kidney concerns   5)HLD--stable, continue pravastatin and aspirin   6) obesity and osteoarthritis----outpatient management  as advised   7) acute hypoxic respiratory failure secondary to pulmonary  embolism --- secondary to #1 above, manage as above #1 --Hypoxia persist , unable to wean off oxygen at this time   8)-De-Saturations when she falls asleep suggesting possible OSA=--outpatient sleep study advised   9)Thyroid Nodule---- 2.6 cm left inferior TR 4 nodule meets criteria for biopsy as above. This correlates with the CT finding. -Patient will need outpatient thyroid biopsy   10) chronic normocytic normochromic anemia--baseline hemoglobin usually between 10 and 11--- etiology unclear -Monitor closely with anticoagulation use   Dispo: The patient is from: Home              Anticipated d/c is to:   SNF                History of Present Illness  11/27/2019  Pulmonary/ 1st office eval/Raheel Kunkle / never smoker with PE/ ? Lung nodule vs infarct Chief Complaint  Patient presents with   Hospitalization Follow-up    July 2nd finished rehab. Has oxygen but has not used it since getting done with rehab. Denies shortness of breath. Dry cough  Dyspnea:  R knee stops her before breathing / walks with cane/  Cough: dry sporadic sometimes worse hs but does not wake her up  Sleep: able to sleep flat bed on 2 pillows  SABA use: none 02 :  Not using since d/c  Rec Stay as active as you can  We will contact you in early September to arrange your CT and Echo repeat and call you  the results with recommendations for long term treatment (set up televist after studies)    Virtual Visit via Telephone Note 11/21/2020   I connected with Kathleen [name] on [birth year] at 1215 pm  by telephone and verified that I am speaking with the correct person using two identifiers. Pt is at home and this call made from my office with no other participants    I discussed the limitations, risks, security and privacy concerns of performing an evaluation and management service by telephone and the availability of in person appointments. I also discussed with the patient that there may be a patient responsible charge related to this service. The patient expressed understanding and agreed to proceed.   History of Present Illness: pe f/u  Dyspnea:  walks with can / not limited by sob  Cough: none Sleeping: no resp problems  SABA use: none  02: none   Still relatively sedentary but no cp/ leg swelling and takes baby asa daily anyway.    No obvious day to day or daytime variability or assoc excess/ purulent sputum or mucus plugs or hemoptysis or cp or chest tightness, subjective wheeze or overt sinus or hb symptoms.    Also denies any obvious fluctuation of symptoms with weather or environmental changes or other aggravating or alleviating factors except as outlined above.   Meds reviewed/ med reconciliation completed       Observations/Objective: Sounds fine, good voice texture, no conversational sob    Assessment and Plan: See problem list for active a/p's   Follow Up Instructions: See avs for instructions unique to this ov which includes revised/ updated med list     I discussed the assessment and treatment plan with the patient. The patient was provided an opportunity to ask questions and all were answered. The patient agreed with the plan and demonstrated an understanding of the instructions.   The patient was advised to call back or seek an in-person evaluation if the  symptoms worsen or if the condition  fails to improve as anticipated.  I provided 25  minutes of non-face-to-face time during this encounter.   Christinia Gully, MD    .             Past Medical History:  Diagnosis Date   Anemia    Arthritis    Chronic kidney disease    Diabetes mellitus type II    Dysphagia    unspecified    GERD (gastroesophageal reflux disease)    Hyperlipidemia 2000   Hypertension 1995   Insomnia    Low back pain    Obesity    Sleep apnea in adult 11/25/2019   Thyroid nodule 11/25/2019    Outpatient Medications Prior to Visit  Medication Sig Dispense Refill   acetaminophen (TYLENOL) 325 MG tablet Take 2 tablets (650 mg total) by mouth every 6 (six) hours as needed for mild pain, fever or headache (or Fever >/= 101). 12 tablet 0   amLODipine (NORVASC) 10 MG tablet TAKE ONE TABLET BY MOUTH ONCE DAILY FOR BLOOD PRESSURE. (Patient taking differently: Take 10 mg by mouth daily. FOR BLOOD PRESSURE.) 90 tablet 0   Apixaban Starter Pack, 10mg  and 5mg , (ELIQUIS DVT/PE STARTER PACK) Take as directed on package: start with two-5mg  tablets twice daily for 7 days. On day 8, switch to one-5mg  tablet twice daily. 1 each 0   cholecalciferol (VITAMIN D) 1000 UNITS tablet Take 1,000 Units by mouth daily.     cloNIDine (CATAPRES) 0.3 MG tablet TAKE (1) TABLET BY MOUTH 3 TIMES DAILY. (Patient taking differently: Take 0.3 mg by mouth 3 (three) times daily. ) 270 tablet 0   fluticasone (FLONASE) 50 MCG/ACT nasal spray Place 2 sprays into both nostrils daily as needed. (Patient taking differently: Place 2 sprays into both nostrils daily as needed for allergies or rhinitis. ) 15 g 1   gabapentin (NEURONTIN) 100 MG capsule Take 1 capsule (100 mg total) by mouth 2 (two) times daily. 60 capsule 3   insulin glargine (LANTUS SOLOSTAR) 100 UNIT/ML Solostar Pen Inject 40 Units into the skin at bedtime. 30 mL 0   lisinopril (ZESTRIL) 10 MG tablet Take 1 tablet (10 mg total) by mouth daily. 30  tablet 3   pantoprazole (PROTONIX) 40 MG tablet Take 1 tablet (40 mg total) by mouth daily. 30 tablet 3   polyethylene glycol (MIRALAX / GLYCOLAX) 17 g packet Take 17 g by mouth daily. 30 each 1   pravastatin (PRAVACHOL) 40 MG tablet Take 1 tablet (40 mg total) by mouth daily. 30 tablet 5   rosuvastatin (CRESTOR) 5 MG tablet Take 5 mg by mouth daily.     senna-docusate (SENOKOT-S) 8.6-50 MG tablet Take 2 tablets by mouth 2 (two) times daily. 120 tablet 2   No facility-administered medications prior to visit.     Objective:     BP (!) 144/64 (BP Location: Left Arm, Patient Position: Sitting, Cuff Size: Large)   Pulse 100   Temp 99.1 F (37.3 C) (Oral)   Ht 5\' 6"  (1.676 m)   Wt 220 lb (99.8 kg)   SpO2 97% Comment: on room air  BMI 35.51 kg/m   SpO2: 97 % (on room air)   Pleasant amb bf walks with cane   HEENT : pt wearing mask not removed for exam due to covid -19 concerns.    NECK :  without JVD/Nodes/TM/ nl carotid upstrokes bilaterally   LUNGS: no acc muscle use,  Nl contour chest which is clear to A and P bilaterally  without cough on insp or exp maneuvers   CV:  RRR  no s3 or murmur or increase in P2, and trace sym LE  edema bilaterally sym   ABD:  Obese soft and nontender with nl inspiratory excursion in the supine position. No bruits or organomegaly appreciated, bowel sounds nl  MS:  Nl gait/ ext warm without deformities, calf tenderness, cyanosis or clubbing No obvious joint restrictions   SKIN: warm and dry without lesions    NEURO:  alert, approp, nl sensorium with  no motor or cerebellar deficits apparent.       Assessment   Pulmonary embolism ([place]) Dx 10/22/19 p car trip to [place] and back in pt with  - CTa 10/22/19 large right-sided submassive PE  and small left upper lobe PE - ECho 10/22/19 Right ventricular systolic function is mildly reduced. The right  ventricular size is mildly enlarged.  Right atrial size was mildly dilated.  -LE venous Dopplers  10/22/19  without DVT  Back to baseline doe/ leg swelling persists but is mild and likely related to use of amlodipine  Will need f/u echo at 3 m on rx to r/o evolving TEPAH and decision at 6 m whether to continue high dose, intermediate dose of asa and welcome hematology input at that point as her risk factors = prior PE, MO and relatively sedentary secondary to DJD  >>> f/u by televisit in 3 m after studies available.    Abnormal CT scan, chest Never smoker - dt a 10/22/19 :  LLL 2.7 x 2.1 ? Cystic lesion vs mass in setting of Acute life threatening PE - placed reminder file for reCT 01/22/20   I though this might just be an infarct but probably some form of benign cyst, no likely ca and no urgency to attempt any kind of invasive w/u in setting of recent PE so continue DOAC and f/u with a 3 month CT s contrast / echo - if lesion persists but echo nl will consider PET in anticipation of bx / surgical intervention which should be postponed until then  Discussed in detail all the  indications, usual  risks and alternatives  relative to the benefits with patient who agrees to proceed with w/u as outlined.       Morbid obesity ([place]) Complicated by DM/ hbp and now PE  Body mass index is 35.51 kg/m.  -   Lab Results  Component Value Date   TSH 1.110 10/22/2019     Contributing to gerd risk/ doe/reviewed the need and the process to achieve and maintain neg calorie balance > defer f/u primary care including intermittently monitoring thyroid status      Supplemental oxygen dependent D/c as of 11/27/2019 > not using/ sats fine    Essential hypertension Adequate control on present rx, reviewed in detail with pt > no change in rx needed    However, the acei is probably contributing to sporadic dry cough and the amlopidine to the swelling > would favor trial of ARB/continue lasix but defer final rx/ f/u to Dr Griffin Dakin capable hands     Medical decision making was a high  level of  complexity in this case because of  two chronic conditions /diagnoses requiring extra time for  H and P, chart review, counselin= , 59min post hosp ov  and generating customized AVS unique to this office visit and charting.   Each maintenance medication was reviewed in detail including emphasizing most importantly the difference between maintenance and prns  and under what circumstances the prns are to be triggered using an action plan format where appropriate. Please see avs for details which were reviewed in writing by both me and my nurse and patient given a written copy highlighted where appropriate with yellow highlighter for the patient's continued care at home along with an updated version of their medications.  Patient was asked to maintain medication reconciliation by comparing this list to the actual medications being used at home and to contact this office right away if there is a conflict or discrepancy.      Christinia Gully, MD 11/27/2019

## 2020-11-21 NOTE — Patient Instructions (Signed)
Ok to finish your eliquis  We will call to set you up with a hematologist to evaluate your risk of future clots  In meantime continue your baby aspirin and stay as active as you can.  Pulmonary follow up is as needed.

## 2020-12-02 ENCOUNTER — Inpatient Hospital Stay (HOSPITAL_COMMUNITY): Payer: Medicare Other | Admitting: Hematology and Oncology

## 2020-12-03 ENCOUNTER — Ambulatory Visit: Payer: Medicare Other | Admitting: Orthopedic Surgery

## 2020-12-03 ENCOUNTER — Ambulatory Visit (INDEPENDENT_AMBULATORY_CARE_PROVIDER_SITE_OTHER): Payer: Medicare Other | Admitting: *Deleted

## 2020-12-03 ENCOUNTER — Other Ambulatory Visit: Payer: Self-pay

## 2020-12-03 DIAGNOSIS — Z0000 Encounter for general adult medical examination without abnormal findings: Secondary | ICD-10-CM | POA: Diagnosis not present

## 2020-12-03 NOTE — Progress Notes (Signed)
Subjective:   Kathleen [name] is a 70 y.o. female who presents for Medicare Annual (Subsequent) preventive examination.  Review of Systems           Objective:    Today's Vitals   12/03/20 1047  PainSc: 0-No pain   There is no height or weight on file to calculate BMI.  Advanced Directives 12/03/2019 10/22/2019 10/22/2019 04/27/2017 01/20/2017 04/20/2016  Does Patient Have a Medical Advance Directive? _0  No  Would patient like information on creating a medical advance directive? Yes (ED - Information included in AVS) No - Patient declined No - Patient declined No - Patient declined - Yes (ED - Information included in AVS)    Current Medications (verified) Outpatient Encounter Medications as of 12/03/2020  Medication Sig   acetaminophen (TYLENOL) 325 MG tablet Take 2 tablets (650 mg total) by mouth every 6 (six) hours as needed for mild pain, fever or headache (or Fever >/= 101).   amLODipine (NORVASC) 10 MG tablet TAKE ONE TABLET BY MOUTH ONCE DAILY FOR BLOOD PRESSURE.   blood glucose meter kit and supplies Dispense based on patient and insurance preference. Use up to four times daily as directed. (FOR ICD-10 E10.9, E11.9).   cholecalciferol (VITAMIN D) 1000 UNITS tablet Take 1,000 Units by mouth daily.   cloNIDine (CATAPRES) 0.3 MG tablet TAKE (1) TABLET BY MOUTH 3 TIMES DAILY.   fluticasone (FLONASE) 50 MCG/ACT nasal spray Place 2 sprays into both nostrils daily as needed. (Patient taking differently: Place 2 sprays into both nostrils daily as needed for allergies or rhinitis.)   gabapentin (NEURONTIN) 100 MG capsule Take 1 capsule (100 mg total) by mouth 2 (two) times daily.   glipiZIDE (GLUCOTROL XL) 2.5 MG 24 hr tablet TAKE (1) TABLET BY MOUTH ONCE DAILY.   glucose blood (ACCU-CHEK GUIDE) test strip USE TO TEST BLOOD SUGAR THREE TIMES DAILY AS DIRECTED      DX E11.65   insulin glargine (LANTUS) 100 UNIT/ML Solostar Pen Inject 60 Units into the skin daily.    lisinopril (ZESTRIL) 20 MG tablet Take 1 tablet (20 mg total) by mouth daily.   pantoprazole (PROTONIX) 40 MG tablet Take 1 tablet (40 mg total) by mouth daily.   polyethylene glycol (MIRALAX / GLYCOLAX) 17 g packet Take 17 g by mouth daily.   pravastatin (PRAVACHOL) 40 MG tablet TAKE ONE TABLET BY MOUTH ONCE DAILY.   ramipril (ALTACE) 10 MG capsule TAKE (1) CAPSULE BY MOUTH ONCE DAILY.   senna-docusate (SENOKOT-S) 8.6-50 MG tablet Take 2 tablets by mouth 2 (two) times daily.   spironolactone-hydrochlorothiazide (ALDACTAZIDE) 25-25 MG tablet Take 1 tablet by mouth daily.   [DISCONTINUED] sitaGLIPtan (JANUVIA) 100 MG tablet Take 100 mg by mouth daily.     No facility-administered encounter medications on file as of 12/03/2020.    Allergies (verified) Patient has no known allergies.   History: Past Medical History:  Diagnosis Date   Acute respiratory failure with hypoxia ([place]) 10/22/2019   Anemia    Arthritis    Chronic kidney disease    Diabetes mellitus type II    Dysphagia    unspecified    GERD (gastroesophageal reflux disease)    Hyperlipidemia 2000   Hypertension 1995   Insomnia    Low back pain    Obesity    Sleep apnea in adult 11/25/2019   Thyroid nodule 11/25/2019   Past Surgical History:  Procedure Laterality Date   Carpal tunnel release  left    CHOLECYSTECTOMY  2007   DIGIT NAIL REMOVAL  08/2011   KNEE SURGERY  1.20.2012   arthroscopy LEFT knee partial medial meniscectomy   TENOTOMY     2,3,4  left foot    toenail removal     TUBAL LIGATION     Family History  Problem Relation Age of Onset   Diabetes Mother    Hypertension Mother        MI   Heart disease Mother    Kidney disease Mother 9       dialysis   Diabetes Sister    Diabetes Brother    Diabetes Brother    Kidney disease Brother    Dementia Sister    Diabetes Father    Social History   Socioeconomic History   Marital status: Divorced    Spouse name: Not on file   Number of children:  4   Years of education: Not on file   Highest education level: 11th grade  Occupational History   Occupation: Full time Engineer, petroleum   Tobacco Use   Smoking status: Never   Smokeless tobacco: Never  Vaping Use   Vaping Use: Never used  Substance and Sexual Activity   Alcohol use: No   Drug use: No   Sexual activity: Yes  Other Topics Concern   Not on file  Social History Narrative   Lives with 2nd oldest son   Social Determinants of Health   Financial Resource Strain: Not on file  Food Insecurity: Not on file  Transportation Needs: Not on file  Physical Activity: Not on file  Stress: Not on file  Social Connections: Not on file    Tobacco Counseling Counseling given: Not Answered   Clinical Intake:     Pain Score: 0-No pain           Diabetic?Nutrition Risk Assessment:  Has the patient had any N/V/D within the last 2 months?  No  Does the patient have any non-healing wounds?  No  Has the patient had any unintentional weight loss or weight gain?  No   Diabetes:  Is the patient diabetic?  Yes  If diabetic, was a CBG obtained today?  No  Did the patient bring in their glucometer from home?  No  How often do you monitor your CBG's? 3 times daily .   Financial Strains and Diabetes Management:  Are you having any financial strains with the device, your supplies or your medication? No .  Does the patient want to be seen by Chronic Care Management for management of their diabetes?  No  Would the patient like to be referred to a Nutritionist or for Diabetic Management?  No   Diabetic Exams:  Diabetic Eye Exam: Completed 07-15-20. Overdue for diabetic eye exam. Pt has been advised about the importance in completing this exam. A referral has been placed today. Message sent to referral coordinator for scheduling purposes. Advised pt to expect a call from office referred to regarding appt.  Diabetic Foot Exam: Completed 03-19-20. Pt has been advised about the  importance in completing this exam. Pt is scheduled for diabetic foot exam on 03-19-21.           Activities of Daily Living No flowsheet data found.  Patient Care Team: Fayrene Helper, MD as PCP - General Carver, Elon Alas, DO as Consulting Physician (Internal Medicine)  Indicate any recent Medical Services you may have received from other than [facility] providers in the past  year (date may be approximate).     Assessment:   This is a routine wellness examination for Jannie.  Hearing/Vision screen No results found.  Dietary issues and exercise activities discussed:     Goals Addressed   None   Depression Screen PHQ 2/9 Scores 11/17/2020 08/05/2020 07/16/2020 06/26/2020 06/19/2020 03/19/2020 02/05/2020  PHQ - 2 Score 0 0 0 0 0 0 0  PHQ- 9 Score - - - - - - -    Fall Risk Fall Risk  11/17/2020 08/05/2020 07/16/2020 06/26/2020 06/19/2020  Falls in the past year? 0 0 0 0 -  Number falls in past yr: 0 0 0 0 0  Injury with Fall? 0 0 0 0 0  Risk for fall due to : - No Fall Risks - No Fall Risks -  Follow up - Falls evaluation completed - Falls evaluation completed -    FALL RISK PREVENTION PERTAINING TO THE HOME:  Any stairs in or around the home? No  If so, are there any without handrails? No  Home free of loose throw rugs in walkways, pet beds, electrical cords, etc? Yes  Adequate lighting in your home to reduce risk of falls? Yes   ASSISTIVE DEVICES UTILIZED TO PREVENT FALLS:  Life alert? No  Use of a cane, walker or w/c? Yes  Grab bars in the bathroom? No  Shower chair or bench in shower? Yes  Elevated toilet seat or a handicapped toilet? Yes   TIMED UP AND GO:  Was the test performed? No .  Length of time to ambulate 10 feet: NA sec.     Cognitive Function:     6CIT Screen 12/03/2019 09/20/2018 04/27/2017 04/20/2016  What Year? 0 points 0 points 0 points 0 points  What month? 0 points 0 points 0 points 0 points  What time? 0 points 0 points 0 points 0 points   Count back from 20 0 points 0 points 0 points 0 points  Months in reverse 0 points 0 points 0 points 0 points  Repeat phrase 2 points 0 points 0 points 0 points  Total Score 2 0 0 0    Immunizations Immunization History  Administered Date(s) Administered   Fluad Quad(high Dose 65+) 03/06/2019, 01/30/2020   Influenza Split 02/23/2011, 03/14/2012   Influenza Whole 03/24/2007, 02/21/2008, 02/26/2010   Influenza,inj,Quad PF,6+ Mos 04/23/2014, 04/15/2015, 12/29/2015, 02/14/2017, 12/27/2017   Moderna Sars-Covid-2 Vaccination 07/26/2019, 08/28/2019, 06/06/2020   Pneumococcal Conjugate-13 12/26/2013   Pneumococcal Polysaccharide-23 11/29/2008, 08/12/2015   Td 02/03/2009   Zoster, Live 10/28/2010    TDAP status: Due, Education has been provided regarding the importance of this vaccine. Advised may receive this vaccine at local pharmacy or Health Dept. Aware to provide a copy of the vaccination record if obtained from local pharmacy or Health Dept. Verbalized acceptance and understanding.  Flu Vaccine status: Up to date  Pneumococcal vaccine status: Up to date  Covid-19 vaccine status: Completed vaccines  Qualifies for Shingles Vaccine? Yes   Zostavax completed No   Shingrix Completed?: No.    Education has been provided regarding the importance of this vaccine. Patient has been advised to call insurance company to determine out of pocket expense if they have not yet received this vaccine. Advised may also receive vaccine at local pharmacy or Health Dept. Verbalized acceptance and understanding.  Screening Tests Health Maintenance  Topic Date Due   Zoster Vaccines- Shingrix (1 of 2) Never done   COLONOSCOPY (Pts 45-15yr Insurance coverage will need to be  confirmed)  12/02/2019   COVID-19 Vaccine (4 - Booster for Moderna series) 09/04/2020   TETANUS/TDAP  06/24/2022 (Originally 02/04/2019)   INFLUENZA VACCINE  12/08/2020   FOOT EXAM  03/19/2021   HEMOGLOBIN A1C  05/20/2021    OPHTHALMOLOGY EXAM  07/15/2021   MAMMOGRAM  08/01/2022   DEXA SCAN  Completed   Hepatitis C Screening  Completed   PNA vac Low Risk Adult  Completed   HPV VACCINES  Aged Out    Health Maintenance  Health Maintenance Due  Topic Date Due   Zoster Vaccines- Shingrix (1 of 2) Never done   COLONOSCOPY (Pts 45-6yr Insurance coverage will need to be confirmed)  12/02/2019   COVID-19 Vaccine (4 - Booster for Moderna series) 09/04/2020    Colorectal cancer screening: Type of screening: Colonoscopy. Completed 12-01-09. Repeat every 10 years  Mammogram status: Completed 07-31-20. Repeat every year  Bone Density status: Completed 08-19-14. Results reflect: Bone density results: NORMAL. Repeat every 5 years.  Lung Cancer Screening: (Low Dose CT Chest recommended if Age 72-80years, 30 pack-year currently smoking OR have quit w/in 15years.) does not qualify.   Lung Cancer Screening Referral: NA  Additional Screening:  Hepatitis C Screening: does qualify;   Vision Screening: Recommended annual ophthalmology exams for early detection of glaucoma and other disorders of the eye. Is the patient up to date with their annual eye exam?  Yes  Who is the provider or what is the name of the office in which the patient attends annual eye exams? [facility] R[facility]na HoffIf pt is not established with a provider, would they like to be referred to a provider to establish care? No .   Dental Screening: Recommended annual dental exams for proper oral hygiene  Community Resource Referral / Chronic Care Management: CRR required this visit?  No   CCM required this visit?  No      Plan:     I have personally reviewed and noted the following in the patient's chart:   Medical and social history Use of alcohol, tobacco or illicit drugs  Current medications and supplements including opioid prescriptions.  Functional ability and status Nutritional status Physical activity Advanced directives List of other  physicians Hospitalizations, surgeries, and ER visits in previous 12 months Vitals Screenings to include cognitive, depression, and falls Referrals and appointments  In addition, I have reviewed and discussed with patient certain preventive protocols, quality metrics, and best practice recommendations. A written personalized care plan for preventive services as well as general preventive health recommendations were provided to patient.     SShelda Altes CMA   12/03/2020   Nurse Notes: Telehealth, Spent 40 mins talking to patient

## 2020-12-03 NOTE — Patient Instructions (Signed)
Kathleen [name] , Thank you for taking time to come for your Medicare Wellness Visit. I appreciate your ongoing commitment to your health goals. Please review the following plan we discussed and let me know if I can assist you in the future.   Screening recommendations/referrals: Colonoscopy: Due now this has been ordered patient just needs to reschedule  Mammogram: Completed Due 07-31-21 Bone Density: Due now would like to wait until next mammogram and have them together Recommended yearly ophthalmology/optometry visit for glaucoma screening and checkup Recommended yearly dental visit for hygiene and checkup  Vaccinations: Influenza vaccine: Completed Due 12-08-20 Pneumococcal vaccine: Completed Tdap vaccine: Due now  Shingles vaccine: Due now     Advanced directives: Information Provided  Conditions/risks identified: Hypertension, Diabetes  Next appointment: 1 year    Preventive Care 72 Years and Older, Female Preventive care refers to lifestyle choices and visits with your health care provider that can promote health and wellness. What does preventive care include? A yearly physical exam. This is also called an annual well check. Dental exams once or twice a year. Routine eye exams. Ask your health care provider how often you should have your eyes checked. Personal lifestyle choices, including: Daily care of your teeth and gums. Regular physical activity. Eating a healthy diet. Avoiding tobacco and drug use. Limiting alcohol use. Practicing safe sex. Taking low-dose aspirin every day. Taking vitamin and mineral supplements as recommended by your health care provider. What happens during an annual well check? The services and screenings done by your health care provider during your annual well check will depend on your age, overall health, lifestyle risk factors, and family history of disease. Counseling  Your health care provider may ask you questions about your: Alcohol  use. Tobacco use. Drug use. Emotional well-being. Home and relationship well-being. Sexual activity. Eating habits. History of falls. Memory and ability to understand (cognition). Work and work Statistician. Reproductive health. Screening  You may have the following tests or measurements: Height, weight, and BMI. Blood pressure. Lipid and cholesterol levels. These may be checked every 5 years, or more frequently if you are over 33 years old. Skin check. Lung cancer screening. You may have this screening every year starting at age 18 if you have a 30-pack-year history of smoking and currently smoke or have quit within the past 15 years. Fecal occult blood test (FOBT) of the stool. You may have this test every year starting at age 82. Flexible sigmoidoscopy or colonoscopy. You may have a sigmoidoscopy every 5 years or a colonoscopy every 10 years starting at age 46. Hepatitis C blood test. Hepatitis B blood test. Sexually transmitted disease (STD) testing. Diabetes screening. This is done by checking your blood sugar (glucose) after you have not eaten for a while (fasting). You may have this done every 1-3 years. Bone density scan. This is done to screen for osteoporosis. You may have this done starting at age 51. Mammogram. This may be done every 1-2 years. Talk to your health care provider about how often you should have regular mammograms. Talk with your health care provider about your test results, treatment options, and if necessary, the need for more tests. Vaccines  Your health care provider may recommend certain vaccines, such as: Influenza vaccine. This is recommended every year. Tetanus, diphtheria, and acellular pertussis (Tdap, Td) vaccine. You may need a Td booster every 10 years. Zoster vaccine. You may need this after age 9. Pneumococcal 13-valent conjugate (PCV13) vaccine. One dose is recommended after age  65. Pneumococcal polysaccharide (PPSV23) vaccine. One dose is  recommended after age 70. Talk to your health care provider about which screenings and vaccines you need and how often you need them. This information is not intended to replace advice given to you by your health care provider. Make sure you discuss any questions you have with your health care provider. Document Released: 05/23/2015 Document Revised: 01/14/2016 Document Reviewed: 02/25/2015 Elsevier Interactive Patient Education  2017 [place] Prevention in the Home Falls can cause injuries. They can happen to people of all ages. There are many things you can do to make your home safe and to help prevent falls. What can I do on the outside of my home? Regularly fix the edges of walkways and driveways and fix any cracks. Remove anything that might make you trip as you walk through a door, such as a raised step or threshold. Trim any bushes or trees on the path to your home. Use bright outdoor lighting. Clear any walking paths of anything that might make someone trip, such as rocks or tools. Regularly check to see if handrails are loose or broken. Make sure that both sides of any steps have handrails. Any raised decks and porches should have guardrails on the edges. Have any leaves, snow, or ice cleared regularly. Use sand or salt on walking paths during winter. Clean up any spills in your garage right away. This includes oil or grease spills. What can I do in the bathroom? Use night lights. Install grab bars by the toilet and in the tub and shower. Do not use towel bars as grab bars. Use non-skid mats or decals in the tub or shower. If you need to sit down in the shower, use a plastic, non-slip stool. Keep the floor dry. Clean up any water that spills on the floor as soon as it happens. Remove soap buildup in the tub or shower regularly. Attach bath mats securely with double-sided non-slip rug tape. Do not have throw rugs and other things on the floor that can make you  trip. What can I do in the bedroom? Use night lights. Make sure that you have a light by your bed that is easy to reach. Do not use any sheets or blankets that are too big for your bed. They should not hang down onto the floor. Have a firm chair that has side arms. You can use this for support while you get dressed. Do not have throw rugs and other things on the floor that can make you trip. What can I do in the kitchen? Clean up any spills right away. Avoid walking on wet floors. Keep items that you use a lot in easy-to-reach places. If you need to reach something above you, use a strong step stool that has a grab bar. Keep electrical cords out of the way. Do not use floor polish or wax that makes floors slippery. If you must use wax, use non-skid floor wax. Do not have throw rugs and other things on the floor that can make you trip. What can I do with my stairs? Do not leave any items on the stairs. Make sure that there are handrails on both sides of the stairs and use them. Fix handrails that are broken or loose. Make sure that handrails are as long as the stairways. Check any carpeting to make sure that it is firmly attached to the stairs. Fix any carpet that is loose or worn. Avoid having throw rugs at  the top or bottom of the stairs. If you do have throw rugs, attach them to the floor with carpet tape. Make sure that you have a light switch at the top of the stairs and the bottom of the stairs. If you do not have them, ask someone to add them for you. What else can I do to help prevent falls? Wear shoes that: Do not have high heels. Have rubber bottoms. Are comfortable and fit you well. Are closed at the toe. Do not wear sandals. If you use a stepladder: Make sure that it is fully opened. Do not climb a closed stepladder. Make sure that both sides of the stepladder are locked into place. Ask someone to hold it for you, if possible. Clearly mark and make sure that you can  see: Any grab bars or handrails. First and last steps. Where the edge of each step is. Use tools that help you move around (mobility aids) if they are needed. These include: Canes. Walkers. Scooters. Crutches. Turn on the lights when you go into a dark area. Replace any light bulbs as soon as they burn out. Set up your furniture so you have a clear path. Avoid moving your furniture around. If any of your floors are uneven, fix them. If there are any pets around you, be aware of where they are. Review your medicines with your doctor. Some medicines can make you feel dizzy. This can increase your chance of falling. Ask your doctor what other things that you can do to help prevent falls. This information is not intended to replace advice given to you by your health care provider. Make sure you discuss any questions you have with your health care provider. Document Released: 02/20/2009 Document Revised: 10/02/2015 Document Reviewed: 05/31/2014 Elsevier Interactive Patient Education  2017 [employer].

## 2020-12-22 ENCOUNTER — Other Ambulatory Visit: Payer: Self-pay | Admitting: Family Medicine

## 2020-12-22 DIAGNOSIS — E785 Hyperlipidemia, unspecified: Secondary | ICD-10-CM

## 2020-12-22 NOTE — Progress Notes (Signed)
I connected with  Kathleen [name] on 98/72/15 by an audio enabled telemedicine application and verified that I am speaking with the correct person using two identifiers.   I discussed the limitations, risks, security and privacy concerns of performing an evaluation and management service by telephone and the availability of in person appointments. I also discussed with the patient that there may be a patient responsible charge related to this service. The patient expressed understanding and verbally consented to this telephonic visit.  The patient was at home. The provider was Tula Nakayama, MD who was in office. This was a telehealth visit.

## 2020-12-26 ENCOUNTER — Ambulatory Visit (INDEPENDENT_AMBULATORY_CARE_PROVIDER_SITE_OTHER): Payer: Medicare Other | Admitting: Orthopedic Surgery

## 2020-12-26 ENCOUNTER — Encounter: Payer: Self-pay | Admitting: Orthopedic Surgery

## 2020-12-26 ENCOUNTER — Other Ambulatory Visit: Payer: Self-pay

## 2020-12-26 VITALS — BP 158/78 | HR 73 | Ht 66.0 in | Wt 218.6 lb

## 2020-12-26 DIAGNOSIS — M19042 Primary osteoarthritis, left hand: Secondary | ICD-10-CM | POA: Diagnosis not present

## 2020-12-26 DIAGNOSIS — M19049 Primary osteoarthritis, unspecified hand: Secondary | ICD-10-CM

## 2020-12-26 NOTE — Progress Notes (Signed)
[facility] Return  Assessment: Kathleen [name] is a 70 y.o. female with the following: Left ring finger, DIP arthritis  Plan: Following in the hand, with associated pain is likely due to diffuse arthritis.  She has developed a cyst overlying the left ring finger, DIP joint.  This is since ruptured, leaving a healing scab.  We reviewed radiographs of the left ring finger, which demonstrates advanced osteoarthritis in the DIP joint.  Limited range of motion on exam.  This joint is close to autofusion.  Swelling and pain likely secondary to the diffuse degenerative changes in the hand.  Advised her to continue to use the hand is much as possible.  No limitations on her activities.  Medications as needed.  Follow-up: Return if symptoms worsen or fail to improve.   Subjective:  Chief Complaint  Patient presents with   Hand Pain    Left ringer finger/swells, resolves, and comes back x 1 year    History of Present Illness: Kathleen [name] is a 70 y.o. female who returns to clinic for repeat evaluation of left hand.  She was previously evaluated for hand pain and swelling, as well as wrist pain and swelling.  This is improved.  She is also noticed that swelling over the distal aspect of the ring finger.  This developed a scab.  The pain and swelling has recently improved.  No specific injury.  No specific treatment  Review of Systems: No fevers or chills No numbness or tingling No chest pain No shortness of breath No bowel or bladder dysfunction No GI distress No headaches   Objective: BP (!) 158/78   Pulse 73   Ht 5\' 6"  (1.676 m)   Wt 218 lb 9.6 oz (99.2 kg)   BMI 35.28 kg/m   Physical Exam:  Elderly female.  No acute distress.  Alert and oriented.  Evaluation left hand demonstrates minimal swelling.  Office deformity to the left ring finger.  She has swelling distally.  There is a healing scab directly overlying the DIP joint.  Minimal range of motion of the DIP joint.   She is able to make a full fist.  Strength is good  IMAGING: I personally ordered and reviewed the following images:  XR of the left hand were previously obtained in clinic, demonstrates diffuse degenerative changes.  Most severe at the DIP joint of the left ring finger.  Mordecai Rasmussen, MD 12/26/2020 1:36 PM

## 2020-12-31 ENCOUNTER — Telehealth: Payer: Self-pay

## 2020-12-31 ENCOUNTER — Other Ambulatory Visit: Payer: Self-pay

## 2020-12-31 MED ORDER — LANTUS SOLOSTAR 100 UNIT/ML ~~LOC~~ SOPN: PEN_INJECTOR | SUBCUTANEOUS | 3 refills | Status: DC

## 2020-12-31 NOTE — Telephone Encounter (Signed)
Patient needing refill on Insulin ph# [phone]

## 2021-01-01 ENCOUNTER — Encounter (HOSPITAL_COMMUNITY): Payer: Self-pay

## 2021-01-01 ENCOUNTER — Other Ambulatory Visit: Payer: Self-pay

## 2021-01-02 ENCOUNTER — Encounter (HOSPITAL_COMMUNITY): Payer: Self-pay | Admitting: Hematology and Oncology

## 2021-01-02 ENCOUNTER — Telehealth: Payer: Self-pay | Admitting: Family Medicine

## 2021-01-02 ENCOUNTER — Inpatient Hospital Stay (HOSPITAL_COMMUNITY): Payer: Medicare Other | Attending: Hematology and Oncology | Admitting: Hematology and Oncology

## 2021-01-02 ENCOUNTER — Other Ambulatory Visit: Payer: Self-pay | Admitting: Family Medicine

## 2021-01-02 VITALS — BP 109/54 | HR 63 | Temp 96.8°F | Resp 20 | Ht 66.0 in | Wt 219.0 lb

## 2021-01-02 DIAGNOSIS — I129 Hypertensive chronic kidney disease with stage 1 through stage 4 chronic kidney disease, or unspecified chronic kidney disease: Secondary | ICD-10-CM | POA: Insufficient documentation

## 2021-01-02 DIAGNOSIS — I2699 Other pulmonary embolism without acute cor pulmonale: Secondary | ICD-10-CM | POA: Insufficient documentation

## 2021-01-02 DIAGNOSIS — E041 Nontoxic single thyroid nodule: Secondary | ICD-10-CM

## 2021-01-02 DIAGNOSIS — E1122 Type 2 diabetes mellitus with diabetic chronic kidney disease: Secondary | ICD-10-CM | POA: Diagnosis not present

## 2021-01-02 NOTE — Progress Notes (Signed)
[place] CONSULT NOTE  Patient Care Team: Fayrene Helper, MD as PCP - General Abbey Chatters, Elon Alas, DO as Consulting Physician (Internal Medicine)  CHIEF COMPLAINTS/PURPOSE OF CONSULTATION:  Pulmonary embolism, new consultation  ASSESSMENT & PLAN:   This is a very pleasant 70 year old female patient with past medical history of unprovoked PE back in June 2021 status post 6 to 7 months of anticoagulation, currently not on any anticoagulation referred to hematology for additional recommendations.  She denies any prior history of DVT/PE.  No family history of clotting disorders.  She could not remember any provoking factors. Physical examination without any concerns.  She is up-to-date with her age-appropriate cancer screening and denies any concerning review of systems for an underlying malignancy.  At this time since she has completed at least 6 months of anticoagulation and since she was about 68 at the time of her first PE, I do not see a reason for hypercoagulable work-up for indefinite anticoagulation.  I have certainly discussed with her about symptoms and signs of DVT/PE and the need to go to the hospital immediately without waiting.  We have also discussed risk factors for DVT/PE. She understands that if she were to have another PE, she will have to do lifelong anticoagulation.  2.  Left thyroid nodule, ultrasound was done which recommended a biopsy.  Patient does not remember having a biopsy.  She has a follow-up appointment with Dr. Moshe Cipro in September and I have recommended that she discuss with Dr. Moshe Cipro as well about the thyroid nodule biopsy. I personally called the office and discussed this with Dr Moshe Cipro as well  At this time, she does not need a hematology follow-up.  She can consider taking a baby aspirin daily.  Thank you for consulting [place] in the care of this patient.  Please do not hesitate to contact us with any additional questions or concerns.  HISTORY  OF PRESENTING ILLNESS:  Kathleen [name] 70 y.o. female is here because of Pulmonary embolism  This is a very pleasant 70 year old female patient with past medical history of pulmonary embolism back in June 2021, unprovoked referred to hematology for additional recommendations.  She [place] when she first noticed shortness of breath. She waited for few days for it to resolve but when she noticed that the shortness of breath persisted, she went to the ER with chief complaint of shortness of breath.  She had a CT PE protocol on October 22, 2019 which showed large right-sided pulmonary emboli with evidence of right heart strain consistent with at least submassive PE.  There was also 2.6 cm left thyroid nodule noted at this time and an ultrasound thyroid was done to follow-up which recommended a thyroid nodule biopsy. She continue anticoagulation for at least 6 to 7 months and discontinued it about a couple months ago when she ran out.  She denies any prior history of thromboembolic episodes.  No family history of clotting disorders.  She had kids and no peripartum or postpartum DVT/PE.  She cannot remember any provoking factors such as surgery, trauma, estrogen supplementation, COVID-19 infection prior to the PE.  She currently feels well, has recovered pretty much from the shortness of breath except for some exertional shortness of breath.  She denies any abnormal mammograms, change in bowel habits or urinary habits.  She cannot remember having a thyroid biopsy.  Rest of the pertinent 10 point ROS reviewed and negative.  REVIEW OF SYSTEMS:   Constitutional:  Denies fevers, chills or abnormal night sweats Eyes: Denies blurriness of vision, double vision or watery eyes Ears, nose, mouth, throat, and face: Denies mucositis or sore throat Respiratory: Denies cough, dyspnea or wheezes Cardiovascular: Denies palpitation, chest discomfort or lower extremity swelling Gastrointestinal:  Denies  nausea, heartburn or change in bowel habits Skin: Denies abnormal skin rashes Lymphatics: Denies new lymphadenopathy or easy bruising Neurological:Denies numbness, tingling or new weaknesses Behavioral/Psych: Mood is stable, no new changes  All other systems were reviewed with the patient and are negative.  MEDICAL HISTORY:  Past Medical History:  Diagnosis Date   Acute respiratory failure with hypoxia ([place]) 10/22/2019   Anemia    Arthritis    Chronic kidney disease    Diabetes mellitus type II    Dysphagia    unspecified    GERD (gastroesophageal reflux disease)    Hyperlipidemia 2000   Hypertension 1995   Insomnia    Low back pain    Obesity    Sleep apnea in adult 11/25/2019   Thyroid nodule 11/25/2019    SURGICAL HISTORY: Past Surgical History:  Procedure Laterality Date   Carpal tunnel release     left    CHOLECYSTECTOMY  2007   DIGIT NAIL REMOVAL  08/2011   KNEE SURGERY  1.20.2012   arthroscopy LEFT knee partial medial meniscectomy   TENOTOMY     2,3,4  left foot    toenail removal     TUBAL LIGATION      SOCIAL HISTORY: Social History   Socioeconomic History   Marital status: Divorced    Spouse name: Not on file   Number of children: 4   Years of education: Not on file   Highest education level: 11th grade  Occupational History   Occupation: Full time Engineer, petroleum   Tobacco Use   Smoking status: Never   Smokeless tobacco: Never  Vaping Use   Vaping Use: Never used  Substance and Sexual Activity   Alcohol use: No   Drug use: No   Sexual activity: Yes  Other Topics Concern   Not on file  Social History Narrative   Lives with 2nd oldest son   Social Determinants of Health   Financial Resource Strain: Low Risk    Difficulty of Paying Living Expenses: Not hard at all  Food Insecurity: No Food Insecurity   Worried About Charity fundraiser in the Last Year: Never true   Arboriculturist in the Last Year: Never true  Transportation Needs: No  Transportation Needs   Lack of Transportation (Medical): No   Lack of Transportation (Non-Medical): No  Physical Activity: Insufficiently Active   Days of Exercise per Week: 3 days   Minutes of Exercise per Session: 30 min  Stress: No Stress Concern Present   Feeling of Stress : Not at all  Social Connections: Moderately Isolated   Frequency of Communication with Friends and Family: More than three times a week   Frequency of Social Gatherings with Friends and Family: More than three times a week   Attends Religious Services: More than 4 times per year   Active Member of [employer] or Organizations: No   Attends Archivist Meetings: Never   Marital Status: Divorced  Human resources officer Violence: Not At Risk   Fear of Current or Ex-Partner: No   Emotionally Abused: No   Physically Abused: No   Sexually Abused: No    FAMILY HISTORY: Family History  Problem Relation Age of Onset  Diabetes Mother    Hypertension Mother        MI   Heart disease Mother    Kidney disease Mother 78       dialysis   Diabetes Father    Diabetes Sister    Dementia Sister    Diabetes Brother    Diabetes Brother    Kidney disease Brother     ALLERGIES:  has No Known Allergies.  MEDICATIONS:  Current Outpatient Medications  Medication Sig Dispense Refill   acetaminophen (TYLENOL) 325 MG tablet Take 2 tablets (650 mg total) by mouth every 6 (six) hours as needed for mild pain, fever or headache (or Fever >/= 101). 12 tablet 0   amLODipine (NORVASC) 10 MG tablet TAKE ONE TABLET BY MOUTH ONCE DAILY FOR BLOOD PRESSURE. 90 tablet 1   blood glucose meter kit and supplies Dispense based on patient and insurance preference. Use up to four times daily as directed. (FOR ICD-10 E10.9, E11.9). 1 each 3   cholecalciferol (VITAMIN D) 1000 UNITS tablet Take 1,000 Units by mouth daily.     cloNIDine (CATAPRES) 0.3 MG tablet TAKE (1) TABLET BY MOUTH 3 TIMES DAILY. 270 tablet 1   fluticasone (FLONASE) 50 MCG/ACT  nasal spray Place 2 sprays into both nostrils daily as needed. (Patient not taking: Reported on 01/01/2021) 15 g 1   gabapentin (NEURONTIN) 100 MG capsule Take 1 capsule (100 mg total) by mouth 2 (two) times daily. 60 capsule 5   glipiZIDE (GLUCOTROL XL) 2.5 MG 24 hr tablet TAKE (1) TABLET BY MOUTH ONCE DAILY. 30 tablet 5   glucose blood (ACCU-CHEK GUIDE) test strip USE TO TEST BLOOD SUGAR THREE TIMES DAILY AS DIRECTED      DX E11.65 100 strip 11   insulin glargine (LANTUS SOLOSTAR) 100 UNIT/ML Solostar Pen INJECT 60 UNITS INTO THE SKIN ONCE DAILY. 15 mL 3   lisinopril (ZESTRIL) 20 MG tablet Take 1 tablet (20 mg total) by mouth daily. 30 tablet 5   pantoprazole (PROTONIX) 40 MG tablet Take 1 tablet (40 mg total) by mouth daily. 30 tablet 3   polyethylene glycol (MIRALAX / GLYCOLAX) 17 g packet Take 17 g by mouth daily. 30 each 1   pravastatin (PRAVACHOL) 40 MG tablet TAKE ONE TABLET BY MOUTH ONCE DAILY. 30 tablet 0   ramipril (ALTACE) 10 MG capsule TAKE (1) CAPSULE BY MOUTH ONCE DAILY. 90 capsule 1   senna-docusate (SENOKOT-S) 8.6-50 MG tablet Take 2 tablets by mouth 2 (two) times daily. 120 tablet 2   spironolactone-hydrochlorothiazide (ALDACTAZIDE) 25-25 MG tablet Take 1 tablet by mouth daily. 90 tablet 1   No current facility-administered medications for this visit.    PHYSICAL EXAMINATION: ECOG PERFORMANCE STATUS: 0 - Asymptomatic  Vitals:   01/02/21 0956  BP: (!) 109/54  Pulse: 63  Resp: 20  Temp: (!) 96.8 F (36 C)  SpO2: 100%   Filed Weights   01/02/21 0956  Weight: 219 lb (99.3 kg)   GENERAL:alert, no distress and comfortable SKIN: skin color, texture, turgor are normal, no rashes or significant lesions EYES: normal, conjunctiva are pink and non-injected, sclera clear OROPHARYNX:no exudate, no erythema and lips, buccal mucosa, and tongue normal  NECK: supple, LYMPH:  no palpable lymphadenopathy in the cervical, axillary  LUNGS: clear to auscultation and percussion with  normal breathing effort HEART: regular rate & rhythm and no murmurs and no lower extremity edema ABDOMEN:abdomen soft, non-tender and normal bowel sounds Musculoskeletal:no cyanosis of digits and no clubbing  PSYCH: alert &  oriented x 3 with fluent speech NEURO: no focal motor/sensory deficits  LABORATORY DATA:  I have reviewed the data as listed Lab Results  Component Value Date   WBC 8.2 10/28/2019   HGB 10.1 (L) 10/28/2019   HCT 32.6 (L) 10/28/2019   MCV 85.3 10/28/2019   PLT 258 10/28/2019     Chemistry      Component Value Date/Time   NA 137 06/20/2020 0915   K 5.0 06/20/2020 0915   CL 103 06/20/2020 0915   CO2 20 06/20/2020 0915   BUN 23 06/20/2020 0915   CREATININE 1.66 (H) 06/20/2020 0915   CREATININE 1.62 (H) 05/24/2019 1048      Component Value Date/Time   CALCIUM 9.9 06/20/2020 0915   ALKPHOS 96 06/20/2020 0915   AST 10 06/20/2020 0915   ALT 12 06/20/2020 0915   BILITOT 0.3 06/20/2020 0915     I have reviewed her CT PE protocol imaging reports from June 2021.  No evidence of DVT in either lower extremity at the time of presentation.  Well-defined low-density in the left lower lobe has been followed with sequential CTs and was thought to be benign.  Thyroid nodule noted in the left thyroid, fine-needle aspiration was recommended for this thyroid nodule.  RADIOGRAPHIC STUDIES: I have personally reviewed the radiological images as listed and agreed with the findings in the report. No results found.  All questions were answered. The patient knows to call the clinic with any problems, questions or concerns. I spent 45 minutes in the care of this patient including H and P, review of records, counseling and coordination of care. I tried and called Dr Moshe Cipro about this as well.     Benay Pike, MD 01/02/2021 10:10 AM

## 2021-01-02 NOTE — Telephone Encounter (Signed)
Thyroid US recommends biopsy since 2021, will follow up and have this done asapls call pt , eneeds in office appt with  me next week so we can get this taken care of

## 2021-01-02 NOTE — Telephone Encounter (Signed)
Needs appt scheduled urgently for next week

## 2021-01-08 ENCOUNTER — Encounter: Payer: Self-pay | Admitting: Family Medicine

## 2021-01-08 ENCOUNTER — Other Ambulatory Visit: Payer: Self-pay

## 2021-01-08 ENCOUNTER — Ambulatory Visit (INDEPENDENT_AMBULATORY_CARE_PROVIDER_SITE_OTHER): Payer: Medicare Other | Admitting: Family Medicine

## 2021-01-08 VITALS — BP 131/66 | HR 64 | Resp 16 | Ht 66.0 in | Wt 218.0 lb

## 2021-01-08 DIAGNOSIS — E041 Nontoxic single thyroid nodule: Secondary | ICD-10-CM | POA: Diagnosis not present

## 2021-01-08 DIAGNOSIS — E7849 Other hyperlipidemia: Secondary | ICD-10-CM | POA: Diagnosis not present

## 2021-01-08 DIAGNOSIS — I10 Essential (primary) hypertension: Secondary | ICD-10-CM

## 2021-01-08 DIAGNOSIS — Z23 Encounter for immunization: Secondary | ICD-10-CM | POA: Diagnosis not present

## 2021-01-08 DIAGNOSIS — E119 Type 2 diabetes mellitus without complications: Secondary | ICD-10-CM | POA: Diagnosis not present

## 2021-01-08 NOTE — Patient Instructions (Addendum)
Annual exam in office with MD November 11 or after call if you need me sooner.Please cancel September appt at checkout  You are referred for an fine-needle biopsy of the nodule in your thyroid gland we will give you the appointment information hopefully at checkout today the order is placed.  It is very important that you get this done.  You will be referred to the thyroid specialist Dr. Nida to follow the thyroid nodule also   Flu vaccine in office today.  Please get fasting lipids CMP and EGFR and hemoglobin A1c first week in November for your mid November visit.  Please get your shingles vaccines at the pharmacy.    Thanks for choosing  [facility], we consider it a privelige to serve you. ' 

## 2021-01-09 DIAGNOSIS — I129 Hypertensive chronic kidney disease with stage 1 through stage 4 chronic kidney disease, or unspecified chronic kidney disease: Secondary | ICD-10-CM | POA: Diagnosis not present

## 2021-01-09 DIAGNOSIS — E559 Vitamin D deficiency, unspecified: Secondary | ICD-10-CM | POA: Diagnosis not present

## 2021-01-09 DIAGNOSIS — E1122 Type 2 diabetes mellitus with diabetic chronic kidney disease: Secondary | ICD-10-CM | POA: Diagnosis not present

## 2021-01-09 DIAGNOSIS — N189 Chronic kidney disease, unspecified: Secondary | ICD-10-CM | POA: Diagnosis not present

## 2021-01-09 DIAGNOSIS — I5032 Chronic diastolic (congestive) heart failure: Secondary | ICD-10-CM | POA: Diagnosis not present

## 2021-01-10 ENCOUNTER — Inpatient Hospital Stay (HOSPITAL_COMMUNITY)
Admission: EM | Admit: 2021-01-10 | Discharge: 2021-01-27 | DRG: 208 | Disposition: A | Discharge status: Skilled Nursing Facility | Payer: Medicare Other | Attending: Internal Medicine | Admitting: Internal Medicine

## 2021-01-10 ENCOUNTER — Emergency Department (HOSPITAL_COMMUNITY): Payer: Medicare Other

## 2021-01-10 ENCOUNTER — Other Ambulatory Visit: Payer: Self-pay

## 2021-01-10 ENCOUNTER — Encounter (HOSPITAL_COMMUNITY): Payer: Self-pay | Admitting: *Deleted

## 2021-01-10 DIAGNOSIS — Z79899 Other long term (current) drug therapy: Secondary | ICD-10-CM

## 2021-01-10 DIAGNOSIS — M1990 Unspecified osteoarthritis, unspecified site: Secondary | ICD-10-CM | POA: Diagnosis not present

## 2021-01-10 DIAGNOSIS — I959 Hypotension, unspecified: Secondary | ICD-10-CM | POA: Diagnosis present

## 2021-01-10 DIAGNOSIS — S0990XA Unspecified injury of head, initial encounter: Secondary | ICD-10-CM | POA: Diagnosis not present

## 2021-01-10 DIAGNOSIS — I469 Cardiac arrest, cause unspecified: Secondary | ICD-10-CM | POA: Diagnosis not present

## 2021-01-10 DIAGNOSIS — R0602 Shortness of breath: Secondary | ICD-10-CM

## 2021-01-10 DIAGNOSIS — I2609 Other pulmonary embolism with acute cor pulmonale: Principal | ICD-10-CM | POA: Diagnosis present

## 2021-01-10 DIAGNOSIS — I468 Cardiac arrest due to other underlying condition: Secondary | ICD-10-CM | POA: Diagnosis not present

## 2021-01-10 DIAGNOSIS — N1832 Chronic kidney disease, stage 3b: Secondary | ICD-10-CM | POA: Diagnosis present

## 2021-01-10 DIAGNOSIS — J9601 Acute respiratory failure with hypoxia: Secondary | ICD-10-CM | POA: Diagnosis not present

## 2021-01-10 DIAGNOSIS — J209 Acute bronchitis, unspecified: Secondary | ICD-10-CM | POA: Diagnosis present

## 2021-01-10 DIAGNOSIS — R579 Shock, unspecified: Secondary | ICD-10-CM

## 2021-01-10 DIAGNOSIS — N179 Acute kidney failure, unspecified: Secondary | ICD-10-CM | POA: Diagnosis not present

## 2021-01-10 DIAGNOSIS — R042 Hemoptysis: Secondary | ICD-10-CM | POA: Diagnosis not present

## 2021-01-10 DIAGNOSIS — E872 Acidosis: Secondary | ICD-10-CM | POA: Diagnosis present

## 2021-01-10 DIAGNOSIS — D631 Anemia in chronic kidney disease: Secondary | ICD-10-CM | POA: Diagnosis not present

## 2021-01-10 DIAGNOSIS — Z9981 Dependence on supplemental oxygen: Secondary | ICD-10-CM

## 2021-01-10 DIAGNOSIS — E785 Hyperlipidemia, unspecified: Secondary | ICD-10-CM | POA: Diagnosis not present

## 2021-01-10 DIAGNOSIS — E041 Nontoxic single thyroid nodule: Secondary | ICD-10-CM | POA: Diagnosis not present

## 2021-01-10 DIAGNOSIS — M129 Arthropathy, unspecified: Secondary | ICD-10-CM | POA: Diagnosis not present

## 2021-01-10 DIAGNOSIS — E1122 Type 2 diabetes mellitus with diabetic chronic kidney disease: Secondary | ICD-10-CM | POA: Diagnosis present

## 2021-01-10 DIAGNOSIS — K219 Gastro-esophageal reflux disease without esophagitis: Secondary | ICD-10-CM | POA: Diagnosis present

## 2021-01-10 DIAGNOSIS — N183 Chronic kidney disease, stage 3 unspecified: Secondary | ICD-10-CM | POA: Diagnosis present

## 2021-01-10 DIAGNOSIS — I129 Hypertensive chronic kidney disease with stage 1 through stage 4 chronic kidney disease, or unspecified chronic kidney disease: Secondary | ICD-10-CM | POA: Diagnosis present

## 2021-01-10 DIAGNOSIS — G931 Anoxic brain damage, not elsewhere classified: Secondary | ICD-10-CM | POA: Diagnosis not present

## 2021-01-10 DIAGNOSIS — J811 Chronic pulmonary edema: Secondary | ICD-10-CM | POA: Diagnosis not present

## 2021-01-10 DIAGNOSIS — Z833 Family history of diabetes mellitus: Secondary | ICD-10-CM

## 2021-01-10 DIAGNOSIS — I6523 Occlusion and stenosis of bilateral carotid arteries: Secondary | ICD-10-CM | POA: Diagnosis not present

## 2021-01-10 DIAGNOSIS — Z841 Family history of disorders of kidney and ureter: Secondary | ICD-10-CM

## 2021-01-10 DIAGNOSIS — R531 Weakness: Secondary | ICD-10-CM | POA: Diagnosis not present

## 2021-01-10 DIAGNOSIS — R570 Cardiogenic shock: Secondary | ICD-10-CM | POA: Diagnosis not present

## 2021-01-10 DIAGNOSIS — R918 Other nonspecific abnormal finding of lung field: Secondary | ICD-10-CM | POA: Diagnosis not present

## 2021-01-10 DIAGNOSIS — I1A0 Resistant hypertension: Secondary | ICD-10-CM | POA: Diagnosis present

## 2021-01-10 DIAGNOSIS — Z20822 Contact with and (suspected) exposure to covid-19: Secondary | ICD-10-CM | POA: Diagnosis not present

## 2021-01-10 DIAGNOSIS — Z8249 Family history of ischemic heart disease and other diseases of the circulatory system: Secondary | ICD-10-CM

## 2021-01-10 DIAGNOSIS — M6281 Muscle weakness (generalized): Secondary | ICD-10-CM | POA: Diagnosis not present

## 2021-01-10 DIAGNOSIS — E118 Type 2 diabetes mellitus with unspecified complications: Secondary | ICD-10-CM

## 2021-01-10 DIAGNOSIS — Z9049 Acquired absence of other specified parts of digestive tract: Secondary | ICD-10-CM

## 2021-01-10 DIAGNOSIS — N170 Acute kidney failure with tubular necrosis: Secondary | ICD-10-CM | POA: Diagnosis not present

## 2021-01-10 DIAGNOSIS — I10 Essential (primary) hypertension: Secondary | ICD-10-CM | POA: Diagnosis not present

## 2021-01-10 DIAGNOSIS — I2699 Other pulmonary embolism without acute cor pulmonale: Secondary | ICD-10-CM

## 2021-01-10 DIAGNOSIS — G4700 Insomnia, unspecified: Secondary | ICD-10-CM | POA: Diagnosis present

## 2021-01-10 DIAGNOSIS — N281 Cyst of kidney, acquired: Secondary | ICD-10-CM | POA: Diagnosis not present

## 2021-01-10 DIAGNOSIS — Z794 Long term (current) use of insulin: Secondary | ICD-10-CM

## 2021-01-10 DIAGNOSIS — E1140 Type 2 diabetes mellitus with diabetic neuropathy, unspecified: Secondary | ICD-10-CM | POA: Diagnosis not present

## 2021-01-10 DIAGNOSIS — J9602 Acute respiratory failure with hypercapnia: Secondary | ICD-10-CM | POA: Diagnosis not present

## 2021-01-10 DIAGNOSIS — E875 Hyperkalemia: Secondary | ICD-10-CM | POA: Diagnosis not present

## 2021-01-10 DIAGNOSIS — I517 Cardiomegaly: Secondary | ICD-10-CM | POA: Diagnosis not present

## 2021-01-10 DIAGNOSIS — E1169 Type 2 diabetes mellitus with other specified complication: Secondary | ICD-10-CM | POA: Diagnosis not present

## 2021-01-10 DIAGNOSIS — E1165 Type 2 diabetes mellitus with hyperglycemia: Secondary | ICD-10-CM | POA: Diagnosis not present

## 2021-01-10 DIAGNOSIS — Z743 Need for continuous supervision: Secondary | ICD-10-CM | POA: Diagnosis not present

## 2021-01-10 DIAGNOSIS — R42 Dizziness and giddiness: Secondary | ICD-10-CM | POA: Diagnosis not present

## 2021-01-10 DIAGNOSIS — E1159 Type 2 diabetes mellitus with other circulatory complications: Secondary | ICD-10-CM | POA: Diagnosis not present

## 2021-01-10 DIAGNOSIS — G9341 Metabolic encephalopathy: Secondary | ICD-10-CM | POA: Diagnosis not present

## 2021-01-10 DIAGNOSIS — J302 Other seasonal allergic rhinitis: Secondary | ICD-10-CM | POA: Diagnosis present

## 2021-01-10 DIAGNOSIS — J9690 Respiratory failure, unspecified, unspecified whether with hypoxia or hypercapnia: Secondary | ICD-10-CM

## 2021-01-10 DIAGNOSIS — Z86711 Personal history of pulmonary embolism: Secondary | ICD-10-CM

## 2021-01-10 DIAGNOSIS — R2689 Other abnormalities of gait and mobility: Secondary | ICD-10-CM | POA: Diagnosis not present

## 2021-01-10 DIAGNOSIS — Z6836 Body mass index (BMI) 36.0-36.9, adult: Secondary | ICD-10-CM | POA: Diagnosis not present

## 2021-01-10 DIAGNOSIS — E1110 Type 2 diabetes mellitus with ketoacidosis without coma: Secondary | ICD-10-CM | POA: Diagnosis not present

## 2021-01-10 DIAGNOSIS — E119 Type 2 diabetes mellitus without complications: Secondary | ICD-10-CM

## 2021-01-10 DIAGNOSIS — I2694 Multiple subsegmental pulmonary emboli without acute cor pulmonale: Secondary | ICD-10-CM | POA: Diagnosis not present

## 2021-01-10 DIAGNOSIS — Z7401 Bed confinement status: Secondary | ICD-10-CM | POA: Diagnosis not present

## 2021-01-10 DIAGNOSIS — G4730 Sleep apnea, unspecified: Secondary | ICD-10-CM | POA: Diagnosis present

## 2021-01-10 DIAGNOSIS — E669 Obesity, unspecified: Secondary | ICD-10-CM | POA: Diagnosis present

## 2021-01-10 DIAGNOSIS — Z7984 Long term (current) use of oral hypoglycemic drugs: Secondary | ICD-10-CM

## 2021-01-10 DIAGNOSIS — R000 Tachycardia, unspecified: Secondary | ICD-10-CM | POA: Diagnosis not present

## 2021-01-10 HISTORY — DX: Other pulmonary embolism without acute cor pulmonale: I26.99

## 2021-01-10 LAB — COMPREHENSIVE METABOLIC PANEL
ALT: 26 U/L (ref 0–44)
AST: 22 U/L (ref 15–41)
Albumin: 4.1 g/dL (ref 3.5–5.0)
Alkaline Phosphatase: 99 U/L (ref 38–126)
Anion gap: 10 (ref 5–15)
BUN: 33 mg/dL — ABNORMAL HIGH (ref 8–23)
CO2: 22 mmol/L (ref 22–32)
Calcium: 9 mg/dL (ref 8.9–10.3)
Chloride: 103 mmol/L (ref 98–111)
Creatinine, Ser: 1.9 mg/dL — ABNORMAL HIGH (ref 0.44–1.00)
GFR, Estimated: 28 mL/min — ABNORMAL LOW (ref >60)
Glucose, Bld: 167 mg/dL — ABNORMAL HIGH (ref 70–99)
Potassium: 4.4 mmol/L (ref 3.5–5.1)
Sodium: 135 mmol/L (ref 135–145)
Total Bilirubin: 0.4 mg/dL (ref 0.3–1.2)
Total Protein: 8 g/dL (ref 6.5–8.1)

## 2021-01-10 LAB — CBC WITH DIFFERENTIAL/PLATELET
Abs Immature Granulocytes: 0.04 10*3/uL (ref 0.00–0.07)
Basophils Absolute: 0.1 10*3/uL (ref 0.0–0.1)
Basophils Relative: 1 %
Eosinophils Absolute: 0.2 10*3/uL (ref 0.0–0.5)
Eosinophils Relative: 2 %
HCT: 36.6 % (ref 36.0–46.0)
Hemoglobin: 11.3 g/dL — ABNORMAL LOW (ref 12.0–15.0)
Immature Granulocytes: 0 %
Lymphocytes Relative: 22 %
Lymphs Abs: 2 10*3/uL (ref 0.7–4.0)
MCH: 27.1 pg (ref 26.0–34.0)
MCHC: 30.9 g/dL (ref 30.0–36.0)
MCV: 87.8 fL (ref 80.0–100.0)
Monocytes Absolute: 0.6 10*3/uL (ref 0.1–1.0)
Monocytes Relative: 7 %
Neutro Abs: 6.3 10*3/uL (ref 1.7–7.7)
Neutrophils Relative %: 68 %
Platelets: 219 10*3/uL (ref 150–400)
RBC: 4.17 MIL/uL (ref 3.87–5.11)
RDW: 13.8 % (ref 11.5–15.5)
WBC: 9.2 10*3/uL (ref 4.0–10.5)
nRBC: 0 % (ref 0.0–0.2)

## 2021-01-10 LAB — BRAIN NATRIURETIC PEPTIDE: B Natriuretic Peptide: 49 pg/mL (ref 0.0–100.0)

## 2021-01-10 LAB — TROPONIN I (HIGH SENSITIVITY): Troponin I (High Sensitivity): 19 ng/L — ABNORMAL HIGH (ref <18)

## 2021-01-10 MED ORDER — HEPARIN (PORCINE) 25000 UT/250ML-% IV SOLN
1300.0000 [IU]/h | INTRAVENOUS | Status: DC
  Administered 2021-01-11: 1300 [IU]/h via INTRAVENOUS
  Filled 2021-01-10: qty 250

## 2021-01-10 MED ORDER — HEPARIN BOLUS VIA INFUSION
5000.0000 [IU] | Freq: Once | INTRAVENOUS | Status: AC
  Administered 2021-01-11: 5000 [IU] via INTRAVENOUS

## 2021-01-10 NOTE — Progress Notes (Addendum)
ANTICOAGULATION CONSULT NOTE - Initial Consult  Pharmacy Consult for heparin Indication:  r/o PE  No Known Allergies  Patient Measurements: Height: 5\' 6"  (167.6 cm) Weight: 98.9 kg (218 lb) IBW/kg (Calculated) : 59.3 Heparin Dosing Weight: 80kg  Vital Signs: Temp: 97.6 F (36.4 C) 01/12/2023 2206) Temp Source: Oral January 12, 2023 2206) BP: 153/79 01/12/2023 2206) Pulse Rate: 116 01-12-2023 2206)   Medical History: Past Medical History:  Diagnosis Date   Acute respiratory failure with hypoxia ([place]) 10/22/2019   Anemia    Arthritis    Chronic kidney disease    Diabetes mellitus type II    Dysphagia    unspecified    GERD (gastroesophageal reflux disease)    Hyperlipidemia 2000   Hypertension 1995   Insomnia    Low back pain    Obesity    PE (pulmonary thromboembolism) ([place])    Sleep apnea in adult 11/25/2019   Thyroid nodule 11/25/2019    Assessment: 70yo female c/o SOB with exertion, h/o PE but not currently on [facility], awaiting imaging, to begin heparin for concern for PE.  Goal of Therapy:  Heparin level 0.3-0.7 units/ml Monitor platelets by anticoagulation protocol: Yes   Plan:  Heparin 5000 units IV bolus x1 followed by infusion at 1300 units/hr and monitor heparin levels and CBC.  Wynona Neat, PharmD, BCPS  01/11/21,11:08 PM  Addendum: Pt coded at [facility], PE was confirmed, and alteplase was ordered for lysis, now transferred to [facility].  Although heparin was never charted as stopped, [facility] MICU RN confirms that [facility] RN reported heparin having been stopped.  Will obtain PTT and restart heparin when PTT <80; goal will be reduced to 0.3-0.5 x24h after tPA.   VB 01/11/2021 6:10 AM

## 2021-01-10 NOTE — ED Triage Notes (Signed)
Pt with hx of PE last year, not currently on blood thinner.

## 2021-01-10 NOTE — ED Triage Notes (Signed)
Pt with dizziness then became SOB around 2130, pt had went to a cookout.  Pt with SOB with exertion.

## 2021-01-10 NOTE — ED Provider Notes (Addendum)
[facility] Provider Note   CSN: [id number] Arrival date & time: 01/10/21  2152     History Chief Complaint  Patient presents with   Shortness of Breath    Kathleen [name] is a 70 y.o. female.  Patient With Progressively worsening shortness of breath since this evening but became worse at a cookout tonight.  Associated with shortness of breath and dizziness.  Denies chest pain.  Denies cough or fever.  Patient with a history of submassive pulmonary embolism last year no longer on anticoagulation. Anticoagulation was stopped several months ago.  She has no chest pain.  Her leg swelling is at baseline.  No abdominal pain, nausea or vomiting.  No cough or fever.  She states that she feels dizzy when she tries to walk around and get short of breath when she tries to ambulate but has no chest pain.  Her blood thinner was stopped by her doctor several months ago. Denies any history of cardiac issues or CHF  The history is provided by the patient.  Shortness of Breath Associated symptoms: no abdominal pain, no chest pain, no cough, no fever, no headaches, no rash and no vomiting       Past Medical History:  Diagnosis Date   Acute respiratory failure with hypoxia ([place]) 10/22/2019   Anemia    Arthritis    Chronic kidney disease    Diabetes mellitus type II    Dysphagia    unspecified    GERD (gastroesophageal reflux disease)    Hyperlipidemia 2000   Hypertension 1995   Insomnia    Low back pain    Obesity    PE (pulmonary thromboembolism) ([place])    Sleep apnea in adult 11/25/2019   Thyroid nodule 11/25/2019    Patient Active Problem List   Diagnosis Date Noted   Pain in finger of left hand 11/17/2020   Left hand pain 08/05/2020   Sleep apnea 11/25/2019   Abnormal CT scan, chest 11/25/2019   Thyroid nodule 11/25/2019   Pulmonary embolism ([place]) 10/22/2019   Chronic kidney disease, stage 3 unspecified ([place]) 05/11/2019   Oxygen dependent 05/11/2019    Finger deformity, acquired 04/03/2018   Knee pain, right 03/19/2018   Seasonal allergies 09/17/2013   Unilateral primary osteoarthritis, left knee 01/11/2012   Morbid obesity ([place]) 02/03/2009   Hyperlipidemia 11/24/2007   Type 2 diabetes mellitus ([place]) 06/09/2007   Essential hypertension 04/26/2007   GERD 04/26/2007    Past Surgical History:  Procedure Laterality Date   Carpal tunnel release     left    CHOLECYSTECTOMY  2007   DIGIT NAIL REMOVAL  08/2011   KNEE SURGERY  1.20.2012   arthroscopy LEFT knee partial medial meniscectomy   TENOTOMY     2,3,4  left foot    toenail removal     TUBAL LIGATION       OB History   No obstetric history on file.     Family History  Problem Relation Age of Onset   Diabetes Mother    Hypertension Mother        MI   Heart disease Mother    Kidney disease Mother 87       dialysis   Diabetes Father    Diabetes Sister    Dementia Sister    Diabetes Brother    Diabetes Brother    Kidney disease Brother     Social History   Tobacco Use   Smoking status: Never   Smokeless  tobacco: Never  Vaping Use   Vaping Use: Never used  Substance Use Topics   Alcohol use: No   Drug use: No    Home Medications Prior to Admission medications   Medication Sig Start Date End Date Taking? Authorizing Provider  acetaminophen (TYLENOL) 325 MG tablet Take 2 tablets (650 mg total) by mouth every 6 (six) hours as needed for mild pain, fever or headache (or Fever >/= 101). 10/28/19   Roxan Hockey, MD  amLODipine (NORVASC) 10 MG tablet TAKE ONE TABLET BY MOUTH ONCE DAILY FOR BLOOD PRESSURE. 11/17/20   Fayrene Helper, MD  blood glucose meter kit and supplies Dispense based on patient and insurance preference. Use up to four times daily as directed. (FOR ICD-10 E10.9, E11.9). 08/15/20   Fayrene Helper, MD  cholecalciferol (VITAMIN D) 1000 UNITS tablet Take 1,000 Units by mouth daily.    [provider]  cloNIDine (CATAPRES) 0.3 MG  tablet TAKE (1) TABLET BY MOUTH 3 TIMES DAILY. 11/17/20   Fayrene Helper, MD  fluticasone (FLONASE) 50 MCG/ACT nasal spray Place 2 sprays into both nostrils daily as needed. 02/28/18   Fayrene Helper, MD  gabapentin (NEURONTIN) 100 MG capsule Take 1 capsule (100 mg total) by mouth 2 (two) times daily. 07/16/20   Fayrene Helper, MD  glipiZIDE (GLUCOTROL XL) 2.5 MG 24 hr tablet TAKE (1) TABLET BY MOUTH ONCE DAILY. 11/17/20   Fayrene Helper, MD  glucose blood (ACCU-CHEK GUIDE) test strip USE TO TEST BLOOD SUGAR THREE TIMES DAILY AS DIRECTED      DX E11.65 11/19/20   Fayrene Helper, MD  insulin glargine (LANTUS SOLOSTAR) 100 UNIT/ML Solostar Pen INJECT 60 UNITS INTO THE SKIN ONCE DAILY. 12/31/20   Fayrene Helper, MD  lisinopril (ZESTRIL) 20 MG tablet Take 1 tablet (20 mg total) by mouth daily. 11/17/20   Fayrene Helper, MD  pantoprazole (PROTONIX) 40 MG tablet Take 1 tablet (40 mg total) by mouth daily. 10/29/19   Roxan Hockey, MD  polyethylene glycol (MIRALAX / GLYCOLAX) 17 g packet Take 17 g by mouth daily. 10/29/19   Roxan Hockey, MD  pravastatin (PRAVACHOL) 40 MG tablet TAKE ONE TABLET BY MOUTH ONCE DAILY. 12/22/20   Fayrene Helper, MD  ramipril (ALTACE) 10 MG capsule TAKE (1) CAPSULE BY MOUTH ONCE DAILY. 11/17/20   Fayrene Helper, MD  senna-docusate (SENOKOT-S) 8.6-50 MG tablet Take 2 tablets by mouth 2 (two) times daily. 10/28/19   Roxan Hockey, MD  spironolactone-hydrochlorothiazide (ALDACTAZIDE) 25-25 MG tablet Take 1 tablet by mouth daily. 11/17/20   Fayrene Helper, MD  sitaGLIPtan (JANUVIA) 100 MG tablet Take 100 mg by mouth daily.    07/22/11  [provider]    Allergies    Patient has no known allergies.  Review of Systems   Review of Systems  Constitutional:  Negative for activity change, appetite change and fever.  HENT:  Negative for congestion and rhinorrhea.   Respiratory:  Positive for shortness of breath. Negative for  cough and chest tightness.   Cardiovascular:  Negative for chest pain and leg swelling.  Gastrointestinal:  Negative for abdominal pain, nausea and vomiting.  Genitourinary:  Negative for dysuria and hematuria.  Musculoskeletal:  Negative for arthralgias and myalgias.  Skin:  Negative for rash.  Neurological:  Positive for dizziness and light-headedness. Negative for weakness and headaches.   all other systems are negative except as noted in the HPI and PMH.   Physical Exam Updated  Vital Signs BP (!) 179/83   Pulse (!) 118   Temp 97.6 F (36.4 C) (Oral)   Resp (!) 25   Ht 5' 6" (1.676 m)   Wt 98.9 kg   SpO2 100%   BMI 35.19 kg/m   Physical Exam Vitals and nursing note reviewed.  Constitutional:      General: She is in acute distress.     Appearance: She is well-developed.     Comments: Mild increased work of breathing, speaking short phrase  HENT:     Head: Normocephalic and atraumatic.     Mouth/Throat:     Pharynx: No oropharyngeal exudate.  Eyes:     Conjunctiva/sclera: Conjunctivae normal.     Pupils: Pupils are equal, round, and reactive to light.  Neck:     Comments: No meningismus. Cardiovascular:     Rate and Rhythm: Regular rhythm. Tachycardia present.     Heart sounds: Normal heart sounds. No murmur heard. Pulmonary:     Effort: Pulmonary effort is normal. No respiratory distress.     Breath sounds: Normal breath sounds.  Abdominal:     Palpations: Abdomen is soft.     Tenderness: There is no abdominal tenderness. There is no guarding or rebound.  Musculoskeletal:        General: No tenderness. Normal range of motion.     Cervical back: Normal range of motion and neck supple.     Right lower leg: Edema present.     Left lower leg: Edema present.  Skin:    General: Skin is warm.  Neurological:     Mental Status: She is alert and oriented to person, place, and time.     Cranial Nerves: No cranial nerve deficit.     Motor: No abnormal muscle tone.      Coordination: Coordination normal.     Comments:  5/5 strength throughout. CN 2-12 intact.Equal grip strength.   Psychiatric:        Behavior: Behavior normal.    ED Results / Procedures / Treatments   Labs (all labs ordered are listed, but only abnormal results are displayed) Labs Reviewed  CBC WITH DIFFERENTIAL/PLATELET - Abnormal; Notable for the following components:      Result Value   Hemoglobin 11.3 (*)    All other components within normal limits  COMPREHENSIVE METABOLIC PANEL - Abnormal; Notable for the following components:   Glucose, Bld 167 (*)    BUN 33 (*)    Creatinine, Ser 1.90 (*)    GFR, Estimated 28 (*)    All other components within normal limits  D-DIMER, QUANTITATIVE - Abnormal; Notable for the following components:   D-Dimer, Quant >20.00 (*)    All other components within normal limits  CBC - Abnormal; Notable for the following components:   Hemoglobin 11.3 (*)    All other components within normal limits  BLOOD GAS, ARTERIAL - Abnormal; Notable for the following components:   pH, Arterial 7.023 (*)    pCO2 arterial 58.9 (*)    Bicarbonate 12.3 (*)    Acid-base deficit 14.4 (*)    Allens test (pass/fail) NOT INDICATED (*)    All other components within normal limits  GLUCOSE, CAPILLARY - Abnormal; Notable for the following components:   Glucose-Capillary 375 (*)    All other components within normal limits  CBC - Abnormal; Notable for the following components:   WBC 21.8 (*)    Hemoglobin 11.3 (*)    All other components within normal limits  CBG MONITORING, ED - Abnormal; Notable for the following components:   Glucose-Capillary 194 (*)    All other components within normal limits  POCT I-STAT EG7 - Abnormal; Notable for the following components:   pH, Ven 7.191 (*)    pCO2, Ven 61.4 (*)    pO2, Ven 31.0 (*)    Acid-base deficit 5.0 (*)    Calcium, Ion 1.10 (*)    All other components within normal limits  TROPONIN I (HIGH SENSITIVITY) -  Abnormal; Notable for the following components:   Troponin I (High Sensitivity) 19 (*)    All other components within normal limits  TROPONIN I (HIGH SENSITIVITY) - Abnormal; Notable for the following components:   Troponin I (High Sensitivity) 77 (*)    All other components within normal limits  RESP PANEL BY RT-PCR (FLU A&B, COVID) ARPGX2  MRSA NEXT GEN BY PCR, NASAL  BRAIN NATRIURETIC PEPTIDE  APTT  BLOOD GAS, VENOUS  PROTIME-INR  COMPREHENSIVE METABOLIC PANEL  LACTIC ACID, PLASMA  FIBRINOGEN  HEMOGLOBIN A1C  MAGNESIUM  TROPONIN I (HIGH SENSITIVITY)    EKG EKG Interpretation  Date/Time:  Saturday January 10 2021 22:04:26 EDT Ventricular Rate:  115 PR Interval:  158 QRS Duration: 78 QT Interval:  322 QTC Calculation: 445 R Axis:   70 Text Interpretation: Sinus tachycardia Otherwise normal ECG Rate faster Nonspecific ST abnormality Confirmed by Ezequiel Essex [phone]) on 01/10/2021 10:58:58 PM  Radiology CT Angio Chest PE W and/or Wo Contrast  Result Date: 01/11/2021 CLINICAL DATA:  PE suspected, low/intermediate prob, positive D-dimer EXAM: CT ANGIOGRAPHY CHEST WITH CONTRAST TECHNIQUE: Multidetector CT imaging of the chest was performed using the standard protocol during bolus administration of intravenous contrast. Multiplanar CT image reconstructions and MIPs were obtained to evaluate the vascular anatomy. CONTRAST:  16m OMNIPAQUE IOHEXOL 350 MG/ML SOLN COMPARISON:  05/01/2020 FINDINGS: Cardiovascular: There are bilateral pulmonary emboli involving all lobes of both lungs. Evidence of right heart strain within RV to LV ratio of 2.7. Cardiomegaly. No evidence of aortic aneurysm. Mediastinum/Nodes: No mediastinal, hilar, or axillary adenopathy. Trachea and esophagus are unremarkable. Lungs/Pleura: No confluent airspace opacities or effusions. Upper Abdomen: Imaging into the upper abdomen demonstrates no acute findings. Reflux of contrast into the IVC and hepatic veins compatible  with right heart dysfunction. Musculoskeletal: Chest wall soft tissues are unremarkable. No acute bony abnormality. Review of the MIP images confirms the above findings. IMPRESSION: Evidence of bilateral pulmonary emboli with right heart strain (RV to LV ratio of 2.7. Cardiomegaly. Critical Value/emergent results were called by telephone at the time of interpretation on 01/11/2021 at 3:08 am to [facility], who verbally acknowledged these results. Electronically Signed   By: KRolm BaptiseM.D.   On: 01/11/2021 03:10   DG Chest Portable 1 View  Result Date: 01/11/2021 CLINICAL DATA:  ET tube placement EXAM: PORTABLE CHEST 1 VIEW COMPARISON:  01/10/2021 FINDINGS: Endotracheal tube is 2 cm above the carina. Mild cardiomegaly, vascular congestion. No confluent opacities, effusions or edema. IMPRESSION: Endotracheal tube 2 cm above the carina. Cardiomegaly, vascular congestion. Electronically Signed   By: KRolm BaptiseM.D.   On: 01/11/2021 03:59   DG Chest Portable 1 View  Result Date: 01/10/2021 CLINICAL DATA:  Shortness of breath EXAM: PORTABLE CHEST 1 VIEW COMPARISON:  10/22/2019 FINDINGS: Cardiomegaly. No confluent opacities, effusions or edema. No acute bony abnormality. IMPRESSION: Cardiomegaly.  No active disease. Electronically Signed   By: KRolm BaptiseM.D.   On: 01/10/2021 23:24    Procedures  Procedure Name: Intubation Date/Time: 01/11/2021 3:58 AM Performed by: Ezequiel Essex, MD Pre-anesthesia Checklist: Patient identified, Patient being monitored, Emergency Drugs available, Timeout performed and Suction available Oxygen Delivery Method: Ambu bag Preoxygenation: Pre-oxygenation with 100% oxygen Induction Type: Rapid sequence Ventilation: Mask ventilation without difficulty Laryngoscope Size: Glidescope and 4 Grade View: Grade II Tube size: 7.5 mm Number of attempts: 2 Airway Equipment and Method: Video-laryngoscopy and Stylet Placement Confirmation: ETT inserted through vocal  cords under direct vision, CO2 detector and Breath sounds checked- equal and bilateral Secured at: 25 cm Tube secured with: ETT holder Dental Injury: Teeth and Oropharynx as per pre-operative assessment  Difficulty Due To: Difficult Airway- due to large tongue, Difficulty was anticipated, Difficult Airway-  due to edematous airway and Difficult Airway- due to limited oral opening Future Recommendations: Recommend- induction with short-acting agent, and alternative techniques readily available Comments: Performed during CPR without medications    .Critical Care  Date/Time: 01/11/2021 3:59 AM Performed by: Ezequiel Essex, MD Authorized by: Ezequiel Essex, MD   Critical care provider statement:    Critical care time (minutes):  90   Critical care was necessary to treat or prevent imminent or life-threatening deterioration of the following conditions:  Circulatory failure, cardiac failure and respiratory failure   Critical care was time spent personally by me on the following activities:  Discussions with consultants, evaluation of patient's response to treatment, examination of patient, ordering and performing treatments and interventions, ordering and review of laboratory studies, ordering and review of radiographic studies, pulse oximetry, re-evaluation of patient's condition, obtaining history from patient or surrogate and review of old charts CPR  Date/Time: 01/11/2021 4:01 AM Performed by: Ezequiel Essex, MD Authorized by: Ezequiel Essex, MD  CPR Procedure Details:      Amount of time prior to administration of ACLS/BLS (minutes):  0   ACLS/BLS initiated by EMS: Yes     CPR/ACLS performed in the ED: Yes     Duration of CPR (minutes):  25   Outcome: ROSC obtained    CPR performed via ACLS guidelines under my direct supervision.  See RN documentation for details including defibrillator use, medications, doses and timing. Ultrasound ED Echo  Date/Time: 01/11/2021 3:20 AM Performed  by: Ezequiel Essex, MD Authorized by: Ezequiel Essex, MD   Procedure details:    Indications: cardiac arrest, dyspnea and syncope     Views: subxiphoid and parasternal short axis view     Images: not archived     Limitations:  Acoustic shadowing, body habitus and positioning Findings:    Pericardium: no pericardial effusion     Cardiac Activity: agonal     LV Function: normal (>50% EF)     RV Diameter: dilated     Other signs of RV strain: paradoxical septal motion and RV hypokinesis   Impression:    Impression: decreased contractility     Impression comment:  No pericardial effusion. RV strain with D shaped ventricle. initial LV activity was agonal but improved   Medications Ordered in ED Medications  heparin bolus via infusion 5,000 Units (has no administration in time range)  heparin ADULT infusion 100 units/mL (25000 units/247m) (has no administration in time range)    ED Course  I have reviewed the triage vital signs and the nursing notes.  Pertinent labs & imaging results that were available during my care of the patient were reviewed by me and considered in my medical decision making (see chart for details).    MDM Rules/Calculators/A&P  Shortness of breath with history of pulmonary embolism no longer on anticoagulation. she is tachycardic and tachypneic but in no significant distress.  EKG shows sinus tachycardia with nonspecific ST changes.  High suspicion for pulmonary embolism.  Labs and imaging will be obtained.  Initiate empiric IV heparin.  Chest x-ray is clear.  Patient continued on IV heparin. She is tachycardic but not hypoxic and is in no significant distress.  Labs show minimal troponin elevation of 19.  Creatinine 1.9 with GFR 29.  Discussed with radiology but is still necessary to proceed with CT scan given concern for large PE.  While in CT scan, patient became unresponsive with agonal respirations.  Assisted ventilations  were performed.  On returning to the room patient was found to be pulseless.  CPR was begun. Patient did have brief episode of ROSC and degraded to PEA/asystole again.  CPR was continued.  Continue per ACLS protocol with epinephrine, calcium, bicarb.  Patient intubated as above.  Empiric IV tPA was given with concern for massive pulmonary embolism.  Called by radiology Dr. Ardeen Garland during code who confirms bilateral large pulmonary emboli with right heart strain.  CPR continued for approximately 25 minutes with return of pulses and spontaneous circulation.  Patient now hypotensive and initiated on IV fluids and as well as IV Levophed.  Family updated extensively.  Care transferred to [facility], ICU discussed with Dr. Duwayne Heck  Patient has maintained her pulse.  She is given IV fluids as well as IV Levophed and epinephrine gtt to maintain her blood pressure. Discussed with Dr. Duwayne Heck who accepts [facility] to [facility], ICU.  She agrees with no further thrombolytics after bolus dose of tPA.  We will reinitiate IV heparin. Family wishes everything to be done for the patient and maintain full code at this time. pH 7.0.  Bicarb drip initiated.  Dr. Duwayne Heck updated. Final Clinical Impression(s) / ED Diagnoses Final diagnoses:  Acute massive pulmonary embolism ([place])  Cardiac arrest ([place])  Acute respiratory failure with hypoxia [facility])    Rx / DC Orders ED Discharge Orders     None        Dezyre Hoefer, Annie Main, MD 01/11/21 9935    Ezequiel Essex, MD 01/11/21 2140

## 2021-01-11 ENCOUNTER — Inpatient Hospital Stay (HOSPITAL_COMMUNITY): Payer: Medicare Other

## 2021-01-11 ENCOUNTER — Emergency Department (HOSPITAL_COMMUNITY): Payer: Medicare Other

## 2021-01-11 DIAGNOSIS — D631 Anemia in chronic kidney disease: Secondary | ICD-10-CM | POA: Diagnosis present

## 2021-01-11 DIAGNOSIS — I2699 Other pulmonary embolism without acute cor pulmonale: Secondary | ICD-10-CM

## 2021-01-11 DIAGNOSIS — N179 Acute kidney failure, unspecified: Secondary | ICD-10-CM

## 2021-01-11 DIAGNOSIS — R579 Shock, unspecified: Secondary | ICD-10-CM

## 2021-01-11 DIAGNOSIS — R000 Tachycardia, unspecified: Secondary | ICD-10-CM | POA: Diagnosis not present

## 2021-01-11 DIAGNOSIS — E1140 Type 2 diabetes mellitus with diabetic neuropathy, unspecified: Secondary | ICD-10-CM | POA: Diagnosis present

## 2021-01-11 DIAGNOSIS — I2694 Multiple subsegmental pulmonary emboli without acute cor pulmonale: Secondary | ICD-10-CM | POA: Diagnosis not present

## 2021-01-11 DIAGNOSIS — I6523 Occlusion and stenosis of bilateral carotid arteries: Secondary | ICD-10-CM | POA: Diagnosis not present

## 2021-01-11 DIAGNOSIS — J9601 Acute respiratory failure with hypoxia: Secondary | ICD-10-CM | POA: Diagnosis not present

## 2021-01-11 DIAGNOSIS — R570 Cardiogenic shock: Secondary | ICD-10-CM | POA: Diagnosis present

## 2021-01-11 DIAGNOSIS — E1169 Type 2 diabetes mellitus with other specified complication: Secondary | ICD-10-CM | POA: Diagnosis not present

## 2021-01-11 DIAGNOSIS — J811 Chronic pulmonary edema: Secondary | ICD-10-CM | POA: Diagnosis not present

## 2021-01-11 DIAGNOSIS — K219 Gastro-esophageal reflux disease without esophagitis: Secondary | ICD-10-CM | POA: Diagnosis present

## 2021-01-11 DIAGNOSIS — G931 Anoxic brain damage, not elsewhere classified: Secondary | ICD-10-CM | POA: Diagnosis not present

## 2021-01-11 DIAGNOSIS — E1122 Type 2 diabetes mellitus with diabetic chronic kidney disease: Secondary | ICD-10-CM | POA: Diagnosis present

## 2021-01-11 DIAGNOSIS — I10 Essential (primary) hypertension: Secondary | ICD-10-CM | POA: Diagnosis not present

## 2021-01-11 DIAGNOSIS — R0602 Shortness of breath: Secondary | ICD-10-CM | POA: Diagnosis not present

## 2021-01-11 DIAGNOSIS — J9602 Acute respiratory failure with hypercapnia: Secondary | ICD-10-CM | POA: Diagnosis not present

## 2021-01-11 DIAGNOSIS — I129 Hypertensive chronic kidney disease with stage 1 through stage 4 chronic kidney disease, or unspecified chronic kidney disease: Secondary | ICD-10-CM | POA: Diagnosis present

## 2021-01-11 DIAGNOSIS — J209 Acute bronchitis, unspecified: Secondary | ICD-10-CM | POA: Diagnosis present

## 2021-01-11 DIAGNOSIS — R042 Hemoptysis: Secondary | ICD-10-CM | POA: Diagnosis not present

## 2021-01-11 DIAGNOSIS — E041 Nontoxic single thyroid nodule: Secondary | ICD-10-CM | POA: Diagnosis present

## 2021-01-11 DIAGNOSIS — I469 Cardiac arrest, cause unspecified: Secondary | ICD-10-CM | POA: Diagnosis not present

## 2021-01-11 DIAGNOSIS — S0990XA Unspecified injury of head, initial encounter: Secondary | ICD-10-CM | POA: Diagnosis not present

## 2021-01-11 DIAGNOSIS — N170 Acute kidney failure with tubular necrosis: Secondary | ICD-10-CM | POA: Diagnosis present

## 2021-01-11 DIAGNOSIS — G9341 Metabolic encephalopathy: Secondary | ICD-10-CM | POA: Diagnosis present

## 2021-01-11 DIAGNOSIS — R42 Dizziness and giddiness: Secondary | ICD-10-CM | POA: Diagnosis not present

## 2021-01-11 DIAGNOSIS — N281 Cyst of kidney, acquired: Secondary | ICD-10-CM | POA: Diagnosis not present

## 2021-01-11 DIAGNOSIS — I468 Cardiac arrest due to other underlying condition: Secondary | ICD-10-CM | POA: Diagnosis present

## 2021-01-11 DIAGNOSIS — Z6836 Body mass index (BMI) 36.0-36.9, adult: Secondary | ICD-10-CM | POA: Diagnosis not present

## 2021-01-11 DIAGNOSIS — I2609 Other pulmonary embolism with acute cor pulmonale: Secondary | ICD-10-CM

## 2021-01-11 DIAGNOSIS — M1990 Unspecified osteoarthritis, unspecified site: Secondary | ICD-10-CM | POA: Diagnosis present

## 2021-01-11 DIAGNOSIS — J9690 Respiratory failure, unspecified, unspecified whether with hypoxia or hypercapnia: Secondary | ICD-10-CM | POA: Diagnosis not present

## 2021-01-11 DIAGNOSIS — E875 Hyperkalemia: Secondary | ICD-10-CM | POA: Diagnosis not present

## 2021-01-11 DIAGNOSIS — E1110 Type 2 diabetes mellitus with ketoacidosis without coma: Secondary | ICD-10-CM | POA: Diagnosis not present

## 2021-01-11 DIAGNOSIS — E872 Acidosis: Secondary | ICD-10-CM | POA: Diagnosis present

## 2021-01-11 DIAGNOSIS — I959 Hypotension, unspecified: Secondary | ICD-10-CM | POA: Diagnosis present

## 2021-01-11 DIAGNOSIS — I517 Cardiomegaly: Secondary | ICD-10-CM | POA: Diagnosis not present

## 2021-01-11 DIAGNOSIS — Z20822 Contact with and (suspected) exposure to covid-19: Secondary | ICD-10-CM | POA: Diagnosis present

## 2021-01-11 DIAGNOSIS — N1832 Chronic kidney disease, stage 3b: Secondary | ICD-10-CM | POA: Diagnosis present

## 2021-01-11 DIAGNOSIS — E1165 Type 2 diabetes mellitus with hyperglycemia: Secondary | ICD-10-CM | POA: Diagnosis present

## 2021-01-11 LAB — GLUCOSE, CAPILLARY
Glucose-Capillary: 375 mg/dL — ABNORMAL HIGH (ref 70–99)
Glucose-Capillary: 322 mg/dL — ABNORMAL HIGH (ref 70–99)
Glucose-Capillary: 401 mg/dL — ABNORMAL HIGH (ref 70–99)
Glucose-Capillary: 327 mg/dL — ABNORMAL HIGH (ref 70–99)
Glucose-Capillary: 267 mg/dL — ABNORMAL HIGH (ref 70–99)
Glucose-Capillary: 197 mg/dL — ABNORMAL HIGH (ref 70–99)
Glucose-Capillary: 142 mg/dL — ABNORMAL HIGH (ref 70–99)

## 2021-01-11 LAB — ECHOCARDIOGRAM COMPLETE
AR max vel: 1.98 cm2
AV Area VTI: 1.64 cm2
AV Area mean vel: 1.73 cm2
AV Mean grad: 4 mmHg
AV Peak grad: 8.6 mmHg
Ao pk vel: 1.47 m/s
Area-P 1/2: 2.39 cm2
Height: 66 in
MV VTI: 1.15 cm2
S' Lateral: 2.3 cm
Weight: 3488 oz

## 2021-01-11 LAB — D-DIMER, QUANTITATIVE: D-Dimer, Quant: >20 ug/mL-FEU — ABNORMAL HIGH (ref 0.00–0.50)

## 2021-01-11 LAB — RESP PANEL BY RT-PCR (FLU A&B, COVID) ARPGX2
Influenza A by PCR: NEGATIVE
Influenza B by PCR: NEGATIVE
SARS Coronavirus 2 by RT PCR: NEGATIVE

## 2021-01-11 LAB — COMPREHENSIVE METABOLIC PANEL
ALT: 203 U/L — ABNORMAL HIGH (ref 0–44)
AST: 203 U/L — ABNORMAL HIGH (ref 15–41)
Albumin: 2.7 g/dL — ABNORMAL LOW (ref 3.5–5.0)
Alkaline Phosphatase: 114 U/L (ref 38–126)
Anion gap: 16 — ABNORMAL HIGH (ref 5–15)
BUN: 34 mg/dL — ABNORMAL HIGH (ref 8–23)
CO2: 22 mmol/L (ref 22–32)
Calcium: 8.1 mg/dL — ABNORMAL LOW (ref 8.9–10.3)
Chloride: 101 mmol/L (ref 98–111)
Creatinine, Ser: 2.31 mg/dL — ABNORMAL HIGH (ref 0.44–1.00)
GFR, Estimated: 22 mL/min — ABNORMAL LOW (ref >60)
Glucose, Bld: 449 mg/dL — ABNORMAL HIGH (ref 70–99)
Potassium: 3.4 mmol/L — ABNORMAL LOW (ref 3.5–5.1)
Sodium: 139 mmol/L (ref 135–145)
Total Bilirubin: 0.9 mg/dL (ref 0.3–1.2)
Total Protein: 5.9 g/dL — ABNORMAL LOW (ref 6.5–8.1)

## 2021-01-11 LAB — CBC
HCT: 36.1 % (ref 36.0–46.0)
HCT: 36.4 % (ref 36.0–46.0)
Hemoglobin: 11.3 g/dL — ABNORMAL LOW (ref 12.0–15.0)
Hemoglobin: 11.3 g/dL — ABNORMAL LOW (ref 12.0–15.0)
MCH: 27.8 pg (ref 26.0–34.0)
MCH: 27 pg (ref 26.0–34.0)
MCHC: 31.3 g/dL (ref 30.0–36.0)
MCHC: 31 g/dL (ref 30.0–36.0)
MCV: 88.7 fL (ref 80.0–100.0)
MCV: 87.1 fL (ref 80.0–100.0)
Platelets: 232 10*3/uL (ref 150–400)
Platelets: 224 10*3/uL (ref 150–400)
RBC: 4.07 MIL/uL (ref 3.87–5.11)
RBC: 4.18 MIL/uL (ref 3.87–5.11)
RDW: 13.8 % (ref 11.5–15.5)
RDW: 13.9 % (ref 11.5–15.5)
WBC: 9.8 10*3/uL (ref 4.0–10.5)
WBC: 21.8 10*3/uL — ABNORMAL HIGH (ref 4.0–10.5)
nRBC: 0 % (ref 0.0–0.2)
nRBC: 0 % (ref 0.0–0.2)

## 2021-01-11 LAB — POCT I-STAT EG7
Acid-base deficit: 5 mmol/L — ABNORMAL HIGH (ref 0.0–2.0)
Bicarbonate: 23.7 mmol/L (ref 20.0–28.0)
Calcium, Ion: 1.1 mmol/L — ABNORMAL LOW (ref 1.15–1.40)
HCT: 36 % (ref 36.0–46.0)
Hemoglobin: 12.2 g/dL (ref 12.0–15.0)
O2 Saturation: 47 %
Patient temperature: 97.8
Potassium: 3.5 mmol/L (ref 3.5–5.1)
Sodium: 141 mmol/L (ref 135–145)
TCO2: 26 mmol/L (ref 22–32)
pCO2, Ven: 61.4 mmHg — ABNORMAL HIGH (ref 44.0–60.0)
pH, Ven: 7.191 — CL (ref 7.250–7.430)
pO2, Ven: 31 mmHg — CL (ref 32.0–45.0)

## 2021-01-11 LAB — CBG MONITORING, ED: Glucose-Capillary: 194 mg/dL — ABNORMAL HIGH (ref 70–99)

## 2021-01-11 LAB — BASIC METABOLIC PANEL
Anion gap: 19 — ABNORMAL HIGH (ref 5–15)
BUN: 39 mg/dL — ABNORMAL HIGH (ref 8–23)
CO2: 18 mmol/L — ABNORMAL LOW (ref 22–32)
Calcium: 8.4 mg/dL — ABNORMAL LOW (ref 8.9–10.3)
Chloride: 102 mmol/L (ref 98–111)
Creatinine, Ser: 2.86 mg/dL — ABNORMAL HIGH (ref 0.44–1.00)
GFR, Estimated: 17 mL/min — ABNORMAL LOW (ref >60)
Glucose, Bld: 337 mg/dL — ABNORMAL HIGH (ref 70–99)
Potassium: 4 mmol/L (ref 3.5–5.1)
Sodium: 139 mmol/L (ref 135–145)

## 2021-01-11 LAB — MRSA NEXT GEN BY PCR, NASAL: MRSA by PCR Next Gen: NOT DETECTED

## 2021-01-11 LAB — MAGNESIUM
Magnesium: 1.5 mg/dL — ABNORMAL LOW (ref 1.7–2.4)
Magnesium: 1.9 mg/dL (ref 1.7–2.4)

## 2021-01-11 LAB — LACTIC ACID, PLASMA: Lactic Acid, Venous: 7.4 mmol/L (ref 0.5–1.9)

## 2021-01-11 LAB — BLOOD GAS, ARTERIAL
Acid-base deficit: 14.4 mmol/L — ABNORMAL HIGH (ref 0.0–2.0)
Bicarbonate: 12.3 mmol/L — ABNORMAL LOW (ref 20.0–28.0)
FIO2: 100
O2 Saturation: 93.1 %
Patient temperature: 37
pCO2 arterial: 58.9 mmHg — ABNORMAL HIGH (ref 32.0–48.0)
pH, Arterial: 7.023 — CL (ref 7.350–7.450)
pO2, Arterial: 107 mmHg (ref 83.0–108.0)

## 2021-01-11 LAB — APTT
aPTT: 114 seconds — ABNORMAL HIGH (ref 24–36)
aPTT: 79 seconds — ABNORMAL HIGH (ref 24–36)

## 2021-01-11 LAB — TROPONIN I (HIGH SENSITIVITY)
Troponin I (High Sensitivity): 77 ng/L — ABNORMAL HIGH (ref <18)
Troponin I (High Sensitivity): 390 ng/L (ref <18)

## 2021-01-11 LAB — PROTIME-INR
INR: >10 (ref 0.8–1.2)
Prothrombin Time: >90 seconds — ABNORMAL HIGH (ref 11.4–15.2)

## 2021-01-11 LAB — HEPARIN LEVEL (UNFRACTIONATED): Heparin Unfractionated: 0.15 IU/mL — ABNORMAL LOW (ref 0.30–0.70)

## 2021-01-11 LAB — FIBRINOGEN: Fibrinogen: >800 mg/dL — ABNORMAL HIGH (ref 210–475)

## 2021-01-11 MED ORDER — MIDAZOLAM HCL 2 MG/2ML IJ SOLN
1.0000 mg | INTRAMUSCULAR | Status: AC | PRN
Start: 2021-01-11 — End: 2021-01-11
  Administered 2021-01-11 (×3): 1 mg via INTRAVENOUS
  Filled 2021-01-11 (×2): qty 2

## 2021-01-11 MED ORDER — MAGNESIUM SULFATE 2 GM/50ML IV SOLN
2.0000 g | Freq: Once | INTRAVENOUS | Status: AC
  Administered 2021-01-11: 2 g via INTRAVENOUS
  Filled 2021-01-11: qty 50

## 2021-01-11 MED ORDER — SODIUM CHLORIDE 0.9 % IV SOLN: 250.0000 mL | INTRAVENOUS | Status: DC

## 2021-01-11 MED ORDER — NOREPINEPHRINE 4 MG/250ML-% IV SOLN: 2.0000 ug/min | INTRAVENOUS | Status: DC

## 2021-01-11 MED ORDER — SODIUM BICARBONATE 8.4 % IV SOLN
INTRAVENOUS | Status: AC | PRN
  Administered 2021-01-11: 50 meq via INTRAVENOUS

## 2021-01-11 MED ORDER — BUSPIRONE HCL 15 MG PO TABS: 30.0000 mg | ORAL_TABLET | Freq: Three times a day (TID) | ORAL | Status: DC

## 2021-01-11 MED ORDER — SODIUM CHLORIDE 0.9 % IV SOLN: 250.0000 mL | Freq: Once | INTRAVENOUS | Status: DC

## 2021-01-11 MED ORDER — HEPARIN (PORCINE) 25000 UT/250ML-% IV SOLN
1400.0000 [IU]/h | INTRAVENOUS | Status: DC
  Administered 2021-01-11: 1100 [IU]/h via INTRAVENOUS
  Administered 2021-01-12: 1300 [IU]/h via INTRAVENOUS
  Administered 2021-01-13: 1200 [IU]/h via INTRAVENOUS
  Administered 2021-01-14: 1450 [IU]/h via INTRAVENOUS
  Administered 2021-01-14: 1350 [IU]/h via INTRAVENOUS
  Administered 2021-01-15 – 2021-01-16 (×2): 1450 [IU]/h via INTRAVENOUS
  Filled 2021-01-11 (×7): qty 250

## 2021-01-11 MED ORDER — ACETAMINOPHEN 650 MG RE SUPP: 650.0000 mg | Freq: Four times a day (QID) | RECTAL | Status: DC | PRN

## 2021-01-11 MED ORDER — MIDAZOLAM HCL 2 MG/2ML IJ SOLN
1.0000 mg | INTRAMUSCULAR | Status: DC | PRN
  Administered 2021-01-11: 1 mg via INTRAVENOUS
  Filled 2021-01-11 (×2): qty 2

## 2021-01-11 MED ORDER — LACTATED RINGERS IV BOLUS
1000.0000 mL | Freq: Once | INTRAVENOUS | Status: AC
  Administered 2021-01-11: 1000 mL via INTRAVENOUS

## 2021-01-11 MED ORDER — LACTATED RINGERS IV SOLN: INTRAVENOUS | Status: DC

## 2021-01-11 MED ORDER — CALCIUM CHLORIDE 10 % IV SOLN
INTRAVENOUS | Status: AC | PRN
  Administered 2021-01-11: 1 g via INTRAVENOUS

## 2021-01-11 MED ORDER — IOHEXOL 350 MG/ML SOLN
60.0000 mL | Freq: Once | INTRAVENOUS | Status: AC | PRN
  Administered 2021-01-11: 60 mL via INTRAVENOUS

## 2021-01-11 MED ORDER — ACETAMINOPHEN 650 MG RE SUPP: 650.0000 mg | RECTAL | Status: DC

## 2021-01-11 MED ORDER — NOREPINEPHRINE 4 MG/250ML-% IV SOLN
0.0000 ug/min | INTRAVENOUS | Status: DC
  Administered 2021-01-11: 15 ug/min via INTRAVENOUS
  Administered 2021-01-11: 20 ug/min via INTRAVENOUS
  Administered 2021-01-11: 16 ug/min via INTRAVENOUS
  Filled 2021-01-11 (×4): qty 250

## 2021-01-11 MED ORDER — MAGNESIUM SULFATE 2 GM/50ML IV SOLN: 2.0000 g | Freq: Once | INTRAVENOUS | Status: DC

## 2021-01-11 MED ORDER — POLYETHYLENE GLYCOL 3350 17 G PO PACK: 17.0000 g | PACK | Freq: Every day | ORAL | Status: DC

## 2021-01-11 MED ORDER — SODIUM BICARBONATE 8.4 % IV SOLN
INTRAVENOUS | Status: DC
  Filled 2021-01-11 (×4): qty 1000

## 2021-01-11 MED ORDER — SODIUM BICARBONATE 8.4 % IV SOLN
INTRAVENOUS | Status: AC | PRN
  Administered 2021-01-11 (×3): 50 meq via INTRAVENOUS

## 2021-01-11 MED ORDER — ONDANSETRON HCL 4 MG/2ML IJ SOLN
4.0000 mg | Freq: Once | INTRAMUSCULAR | Status: AC
  Administered 2021-01-11: 4 mg via INTRAVENOUS
  Filled 2021-01-11: qty 2

## 2021-01-11 MED ORDER — FENTANYL CITRATE (PF) 100 MCG/2ML IJ SOLN
50.0000 ug | INTRAMUSCULAR | Status: DC | PRN
  Administered 2021-01-11: 50 ug via INTRAVENOUS
  Filled 2021-01-11 (×2): qty 2

## 2021-01-11 MED ORDER — SODIUM CHLORIDE 0.9 % IV BOLUS
1000.0000 mL | Freq: Once | INTRAVENOUS | Status: AC
  Administered 2021-01-11: 1000 mL via INTRAVENOUS

## 2021-01-11 MED ORDER — POLYETHYLENE GLYCOL 3350 17 G PO PACK
17.0000 g | PACK | Freq: Every day | ORAL | Status: DC | PRN
  Administered 2021-01-20: 17 g
  Filled 2021-01-11: qty 1

## 2021-01-11 MED ORDER — CHLORHEXIDINE GLUCONATE CLOTH 2 % EX PADS
6.0000 | MEDICATED_PAD | Freq: Every day | CUTANEOUS | Status: DC
  Administered 2021-01-11 – 2021-01-13 (×4): 6 via TOPICAL

## 2021-01-11 MED ORDER — ONDANSETRON HCL 4 MG/2ML IJ SOLN
4.0000 mg | Freq: Four times a day (QID) | INTRAMUSCULAR | Status: DC | PRN
  Administered 2021-01-11 – 2021-01-24 (×6): 4 mg via INTRAVENOUS
  Filled 2021-01-11 (×7): qty 2

## 2021-01-11 MED ORDER — ACETAMINOPHEN 160 MG/5ML PO SOLN
650.0000 mg | Freq: Four times a day (QID) | ORAL | Status: DC | PRN
  Filled 2021-01-11: qty 20.3

## 2021-01-11 MED ORDER — DOCUSATE SODIUM 50 MG/5ML PO LIQD: 100.0000 mg | Freq: Two times a day (BID) | ORAL | Status: DC

## 2021-01-11 MED ORDER — SODIUM BICARBONATE 8.4 % IV SOLN
50.0000 meq | Freq: Once | INTRAVENOUS | Status: AC
  Administered 2021-01-11: 50 meq via INTRAVENOUS

## 2021-01-11 MED ORDER — EPINEPHRINE 1 MG/10ML IJ SOSY
PREFILLED_SYRINGE | INTRAMUSCULAR | Status: AC | PRN
  Administered 2021-01-11: 1 mg via INTRAVENOUS

## 2021-01-11 MED ORDER — ORAL CARE MOUTH RINSE
15.0000 mL | OROMUCOSAL | Status: DC
  Administered 2021-01-11 – 2021-01-12 (×10): 15 mL via OROMUCOSAL

## 2021-01-11 MED ORDER — NOREPINEPHRINE 4 MG/250ML-% IV SOLN
2.0000 ug/min | INTRAVENOUS | Status: DC
  Administered 2021-01-11: 2 ug/min via INTRAVENOUS

## 2021-01-11 MED ORDER — CHLORHEXIDINE GLUCONATE 0.12% ORAL RINSE (MEDLINE KIT)
15.0000 mL | Freq: Two times a day (BID) | OROMUCOSAL | Status: DC
  Administered 2021-01-11 – 2021-01-12 (×3): 15 mL via OROMUCOSAL

## 2021-01-11 MED ORDER — FENTANYL CITRATE PF 50 MCG/ML IJ SOSY
50.0000 ug | PREFILLED_SYRINGE | INTRAMUSCULAR | Status: DC | PRN
Start: 2021-01-11 — End: 2021-01-11

## 2021-01-11 MED ORDER — EPINEPHRINE 1 MG/10ML IJ SOSY
PREFILLED_SYRINGE | INTRAMUSCULAR | Status: AC | PRN
  Administered 2021-01-11 (×8): 1 mg via INTRAVENOUS

## 2021-01-11 MED ORDER — ACETAMINOPHEN 325 MG PO TABS: 650.0000 mg | ORAL_TABLET | ORAL | Status: DC

## 2021-01-11 MED ORDER — PANTOPRAZOLE SODIUM 40 MG IV SOLR
40.0000 mg | Freq: Every day | INTRAVENOUS | Status: DC
  Administered 2021-01-11 – 2021-01-20 (×10): 40 mg via INTRAVENOUS
  Filled 2021-01-11 (×10): qty 40

## 2021-01-11 MED ORDER — ACETAMINOPHEN 160 MG/5ML PO SOLN: 650.0000 mg | ORAL | Status: DC

## 2021-01-11 MED ORDER — FENTANYL CITRATE PF 50 MCG/ML IJ SOSY
50.0000 ug | PREFILLED_SYRINGE | INTRAMUSCULAR | Status: AC | PRN
  Administered 2021-01-11 (×3): 50 ug via INTRAVENOUS
  Filled 2021-01-11 (×2): qty 1

## 2021-01-11 MED ORDER — EPINEPHRINE HCL 5 MG/250ML IV SOLN IN NS
0.5000 ug/min | INTRAVENOUS | Status: DC
  Administered 2021-01-11: 10 ug/min via INTRAVENOUS
  Filled 2021-01-11: qty 250

## 2021-01-11 MED ORDER — ACETAMINOPHEN 325 MG PO TABS
650.0000 mg | ORAL_TABLET | Freq: Four times a day (QID) | ORAL | Status: DC | PRN
  Administered 2021-01-12 – 2021-01-21 (×8): 650 mg via ORAL
  Filled 2021-01-11 (×8): qty 2

## 2021-01-11 MED ORDER — INSULIN ASPART 100 UNIT/ML IJ SOLN: 0.0000 [IU] | INTRAMUSCULAR | Status: DC

## 2021-01-11 MED ORDER — SODIUM BICARBONATE 8.4 % IV SOLN
INTRAVENOUS | Status: AC
  Filled 2021-01-11: qty 150

## 2021-01-11 MED ORDER — ALTEPLASE (PULMONARY EMBOLISM) INFUSION
100.0000 mg | Freq: Once | INTRAVENOUS | Status: AC
  Administered 2021-01-11: 100 mg via INTRAVENOUS
  Filled 2021-01-11: qty 100

## 2021-01-11 MED ORDER — INSULIN ASPART 100 UNIT/ML IJ SOLN
0.0000 [IU] | INTRAMUSCULAR | Status: DC
  Administered 2021-01-11: 15 [IU] via SUBCUTANEOUS
  Administered 2021-01-11: 8 [IU] via SUBCUTANEOUS
  Administered 2021-01-11: 11 [IU] via SUBCUTANEOUS
  Administered 2021-01-11: 3 [IU] via SUBCUTANEOUS
  Administered 2021-01-12: 2 [IU] via SUBCUTANEOUS

## 2021-01-11 MED ORDER — SODIUM CHLORIDE 0.9 % IV BOLUS: 1000.0000 mL | Freq: Once | INTRAVENOUS | Status: DC

## 2021-01-11 MED ORDER — DOCUSATE SODIUM 50 MG/5ML PO LIQD: 100.0000 mg | Freq: Two times a day (BID) | ORAL | Status: DC | PRN

## 2021-01-11 NOTE — Progress Notes (Signed)
RT note- Patient transported to CT and back to 2M14.

## 2021-01-11 NOTE — Progress Notes (Signed)
Updated pt's family at bedside about current status and treatment plan.  Reviewed orders and adjust IV fluids.  She is having episodes of vomiting.  Unable to place NG tube at this time since she received tPA few hours ago.  Continue prn zofran.  Chesley Mires, MD [place] Pager - [phone] 01/11/2021, 8:01 AM

## 2021-01-11 NOTE — Progress Notes (Signed)
ANTICOAGULATION CONSULT NOTE - Follow Up Consult  Pharmacy Consult for IV Heparin Indication: pulmonary embolus  No Known Allergies  Patient Measurements: Height: 5' 6"  (167.6 cm) Weight: 98.9 kg (218 lb) IBW/kg (Calculated) : 59.3 Heparin Dosing Weight: 81.6 kg  Vital Signs: Temp: 98 F (36.7 C) (09/04 1600) Temp Source: Oral (09/04 1600) BP: 106/71 (09/04 1830) Pulse Rate: 89 (09/04 1830)  Labs: Recent Labs    01/10/21 2316 01/11/21 0120 01/11/21 0655 01/11/21 0700 01/11/21 0842 01/11/21 1300 01/11/21 1800  HGB 11.3* 11.3* 11.3* 12.2  --   --   --   HCT 36.6 36.1 36.4 36.0  --   --   --   PLT 219 232 224  --   --   --   --   APTT  --   --  114*  --  79*  --   --   LABPROT  --   --  >90.0*  --   --   --   --   INR  --   --  >10.0*  --   --   --   --   HEPARINUNFRC  --   --   --   --   --   --  0.15*  CREATININE 1.90*  --  2.31*  --   --  2.86*  --   TROPONINIHS 19* 77* 390*  --   --   --   --      Estimated Creatinine Clearance: 21.4 mL/min (A) (by C-G formula based on SCr of 2.86 mg/dL (H)).   Medications:  Scheduled:   chlorhexidine gluconate (MEDLINE KIT)  15 mL Mouth Rinse BID   Chlorhexidine Gluconate Cloth  6 each Topical Daily   insulin aspart  0-15 Units Subcutaneous Q4H   mouth rinse  15 mL Mouth Rinse 10 times per day   pantoprazole (PROTONIX) IV  40 mg Intravenous QHS   Infusions:   sodium chloride Stopped (01/11/21 0813)   heparin Stopped (01/11/21 1701)   lactated ringers 125 mL/hr at 01/11/21 1800   norepinephrine (LEVOPHED) Adult infusion 13 mcg/min (01/11/21 1800)   sodium chloride      Assessment: 70 years of age female who arrested at [facility] with confirmed PE and received alteplase 167m  at 03:00 AM. Patient has had two aPTTs, last down to 79. CBC within normal limits. Patient with nausea and vomiting - sent for stat CT Head which was reviewed by Dr. SHalford Chessman Dr. SHalford Chessmangave verbal order to proceed with IV heparin therapy. Patient with  AKI - SCr up to 2.31 (baseline ~1.5-1.7).  [facility] uts[facility]  Patient with limited IV access, labs being drawn from current IV access after holding heparin drip and flushing line,  Heparin level 0.15 less than goal but may be altered from approach Will increase conservatively  Goal of Therapy:  Heparin level 0.3 to 0.5 units/ml for 24 hours (at 01/12/21 at 3AM) Monitor platelets by anticoagulation protocol: Yes   Plan:  Increase IV Heparin  1300 units/hr.  Daily Heparin level and CBC while on therapy.  Mag replaced now 1.9 K improved 4 - no supplement needed     LBonnita NasutiPharm.D. CPP, BCPS Clinical Pharmacist [phone]/08/2020 7:22 PM

## 2021-01-11 NOTE — Progress Notes (Signed)
ANTICOAGULATION CONSULT NOTE - Follow Up Consult  Pharmacy Consult for IV Heparin Indication: pulmonary embolus  No Known Allergies  Patient Measurements: Height: 5' 6"  (167.6 cm) Weight: 98.9 kg (218 lb) IBW/kg (Calculated) : 59.3 Heparin Dosing Weight: 81.6 kg  Vital Signs: Temp: 97.9 F (36.6 C) (09/04 0700) Temp Source: Oral (09/04 0549) BP: 127/77 (09/04 0800) Pulse Rate: 110 (09/04 0800)  Labs: Recent Labs    01/10/21 2316 01/11/21 0120 01/11/21 0655 01/11/21 0700 01/11/21 0842  HGB 11.3* 11.3* 11.3* 12.2  --   HCT 36.6 36.1 36.4 36.0  --   PLT 219 232 224  --   --   APTT  --   --  114*  --  79*  LABPROT  --   --  >90.0*  --   --   INR  --   --  >10.0*  --   --   CREATININE 1.90*  --  2.31*  --   --   TROPONINIHS 19* 77* 390*  --   --     Estimated Creatinine Clearance: 26.5 mL/min (A) (by C-G formula based on SCr of 2.31 mg/dL (H)).   Medications:  Scheduled:   chlorhexidine gluconate (MEDLINE KIT)  15 mL Mouth Rinse BID   Chlorhexidine Gluconate Cloth  6 each Topical Daily   insulin aspart  0-15 Units Subcutaneous Q4H   mouth rinse  15 mL Mouth Rinse 10 times per day   pantoprazole (PROTONIX) IV  40 mg Intravenous QHS   Infusions:   sodium chloride Stopped (01/11/21 0813)   epinephrine Stopped (01/11/21 0838)   lactated ringers 125 mL/hr at 01/11/21 0900   norepinephrine (LEVOPHED) Adult infusion 13 mcg/min (01/11/21 0900)   sodium chloride      Assessment: 70 years of age female who arrested at [facility] with confirmed PE and received alteplase at 03:00 AM. Patient has had two aPTTs, last down to 79. CBC within normal limits. Patient with nausea and vomiting - sent for stat CT Head which was reviewed by Dr. Halford Chessman. Dr. Halford Chessman gave verbal order to proceed with IV heparin therapy. Patient with AKI - SCr up to 2.31 (baseline ~1.5-1.7).   Goal of Therapy:  Heparin level 0.3 to 0.5 units/ml for 24 hours (at 01/12/21 at 3AM) Monitor platelets by  anticoagulation protocol: Yes   Plan:  Start IV Heparin at 1100 units/hr. Heparin level in 8 hours.  Daily Heparin level and CBC while on therapy.   Also, Mag level low at 1.5 - orders to replace per normothermia protocol (patient in AKI) - Ok for Magnesium 2g IV x1 per Dr. Halford Chessman. Order has been placed.   Sloan Leiter, PharmD, BCPS, BCCCP Clinical Pharmacist Please refer to [facility] for [place] numbers 01/11/2021,9:21 AM

## 2021-01-11 NOTE — Code Documentation (Signed)
Pulses noted, hr tachy,Dr Rancour speaking with family

## 2021-01-11 NOTE — Progress Notes (Signed)
VASCULAR LAB    Bilateral lower extremity venous duplex has been performed.  See CV proc for preliminary results.   Nakeshia Waldeck, RVT 01/11/2021, 11:29 AM

## 2021-01-11 NOTE — Code Documentation (Signed)
Pulses noted, sinus tach on monitor.

## 2021-01-11 NOTE — Code Documentation (Signed)
Pt went unresponsive while in ct, rapid response called, pt brought back to er, Dr Wyvonnia Dusky at bedside,

## 2021-01-11 NOTE — H&P (Signed)
NAME:  Kathleen [name], MRN:  [id number], DOB:  [birth year], LOS: 0 ADMISSION DATE:  01/10/2021, CONSULTATION DATE:  9/4 REFERRING MD:  Rancour MD , CHIEF COMPLAINT:  Dizziness and SOB   History of Present Illness:  Patient is encephalopathic and/or intubated. Therefore history has been obtained from chart review.   Shayda Kalka Blystone is a 70 y.o. female, who presented to the [facility] ED with a chief complaint of Dizziness and SOB   They have a pertinent past medical history of unprovoked PE (June 2021) not on AC, HTN, CKD, HLD, GERD, thyroid nodule, DMII, obesity  Ms. Kolodny was at a cookout the evening of 9/3 and became dizzy and SOB. She presented to the ED. ED course was notable for no leukocytosis, fever, or infiltrate on CXR. D dimer was above reportable range. She was stared on a heparin gtt. A CTA chest was obtained which demonstrated bilateral pulmonary emboli with RV strain. Per nursing notes, while in the ED she aasystole arrested with CPR for 5 minutes with ROSC, arrested a second time with 25 minutes of CPR with ROSC, and finally arrested a third time with 2 minutes of CPR with ROSC. 160m of TPA was given. She was intubated in the ED.  PCCM was consulted for admission.   Pertinent  Medical History  [facility], CKD, HLD, GERD, thyroid nodule, DMII, obesity  Significant Hospital Events: Including procedures, antibiotic start and stop dates in addition to other pertinent events   9/3 presented 9/4 Asystole arrest, TPA 1070m intubated  Interim History / Subjective:  Intubated  On 15 levo, 7 epi  Unable to obtain subjective evaluation due to patient status   Objective   Blood pressure 107/62, pulse 85, temperature 97.6 F (36.4 C), temperature source Oral, resp. rate (!) 35, height _0  (1.676 m), weight 98.9 kg, SpO2 100 %.    Vent Mode: PRVC FiO2 (%):  [100 %] 100 % Set Rate:  [28 bmp-35 bmp] 35 bmp Vt Set:  [470 mL-580 mL] 470 mL PEEP:  [5  cmH20] 5 cmH20 Plateau Pressure:  [25 cmH20] 25 cmH20  No intake or output data in the 24 hours ending 01/11/21 0605 Filed Weights   01/10/21 2300  Weight: 98.9 kg    Examination: General: in bed, ill appearing, NAD HEENT: MM pink/moist, anicteric, atraumatic Neuro: GCS 3, RASS -5 , PERRL 71m671mV: S1S2, st, no m/r/g appreciated PULM:  air movement appreciated in all lobes, Trachea midline, chest expansion symmetric GI: soft, bsx4 hypoactive, non distended   Extremities: cool/dry, no pretibial edema, capillary refill greater than 3 seconds  Skin: no rashes or lesions noted   Resolved Hospital Problem list     Assessment & Plan:  Pulmonary embolism Cardiac Arrest- Asystole, secondary to above Shock- secondary to above Hx of unprovoked PE. CTA bilateral pulmonary emboli with RV strain. S/P 100m30m TPA. -Admit to ICU with continuous tele and SPO2 monitoring -Goal MAP greater than 65. Titrate levophed and epi to goal. -Goal normothermia. Target temperature between 36 and 37.8 degrees celsius. Notify provider if temperature goals are not met. -AM CMP, CBC, INR, fibrinogen -Start scheduled tylenol 650mg371m per tube or PR. -Trend Troponin and lactate -Repeat EKG -Obtain ECHO -Obtain LE duplex  -Discussed AC with Dr. GonzaPatsey Bertholdck PTT and resume heparin gtt. -Continue bicarb gtt.  Acute respiratory failure with hypercapnia Secondary to above. ABG 7.02/58/107/12.3 -LTVV strategy with tidal volumes of 4-8 cc/kg ideal body weight -  Goal plateau pressures less than 30 and driving pressures less than 15 -Wean PEEP/FiO2 for SpO2 92-98% -VAP bundle -Daily SAT and SBT -PRN Versed and fentanyl push. Goal RASS 0, CPOT 0-2 -Follow intermittent CXR and ABG PRN  Acute Metabolic Encephalopathy Secondary to cardiac arrest. GCS 3 on exam. Received fentanyl and versed by ems in route to cone. -Obtain head CT -AM neurology consult -PAD bundle as discussed -Frequent neuro exams -Continue  neuroprotective measures: goal normothermia, euglycemia, HOB greater than 30 when able, head in neutral alignment, normocapnia, normoxia, normonatremia.    AKI on CKD Creatinine 1.9. Baseline 1.6 -Ensure renal perfusion. Goal MAP 65 or greater. -Avoid neprotoxic drugs as possible. -Strict I&O's -Follow up AM creatinine  DMII -Blood Glucose goal 140-180. -SSI  HX GERD -PPI  Hx thyroid nodule Biopsy recommended and scheduled outpatient -On hold during critical illness.   Best Practice (right click and "Reselect all SmartList Selections" daily)   Diet/type: NPO DVT prophylaxis: systemic heparin GI prophylaxis: PPI Lines: N/A Foley:  N/A Code Status:  full code Last date of multidisciplinary goals of care discussion [pending]  Labs   CBC: Recent Labs  Lab 01/10/21 2316 01/11/21 0120  WBC 9.2 9.8  NEUTROABS 6.3  --   HGB 11.3* 11.3*  HCT 36.6 36.1  MCV 87.8 88.7  PLT 219 382    Basic Metabolic Panel: Recent Labs  Lab 01/10/21 2316  NA 135  K 4.4  CL 103  CO2 22  GLUCOSE 167*  BUN 33*  CREATININE 1.90*  CALCIUM 9.0   GFR: Estimated Creatinine Clearance: 32.2 mL/min (A) (by C-G formula based on SCr of 1.9 mg/dL (H)). Recent Labs  Lab 01/10/21 2316 01/11/21 0120  WBC 9.2 9.8    Liver Function Tests: Recent Labs  Lab 01/10/21 2316  AST 22  ALT 26  ALKPHOS 99  BILITOT 0.4  PROT 8.0  ALBUMIN 4.1   No results for input(s): LIPASE, AMYLASE in the last 168 hours. No results for input(s): AMMONIA in the last 168 hours.  ABG    Component Value Date/Time   PHART 7.023 (LL) 01/11/2021 0340   PCO2ART 58.9 (H) 01/11/2021 0340   PO2ART 107 01/11/2021 0340   HCO3 12.3 (L) 01/11/2021 0340   ACIDBASEDEF 14.4 (H) 01/11/2021 0340   O2SAT 93.1 01/11/2021 0340     Coagulation Profile: No results for input(s): INR, PROTIME in the last 168 hours.  Cardiac Enzymes: No results for input(s): CKTOTAL, CKMB, CKMBINDEX, TROPONINI in the last 168  hours.  HbA1C: Hemoglobin A1C  Date/Time Value Ref Range Status  02/05/2020 03:26 PM 7.5 (A) 4.0 - 5.6 % Final  01/30/2020 10:15 AM 7.3 (A) 4.0 - 5.6 % Final   HbA1c, POC (controlled diabetic range)  Date/Time Value Ref Range Status  11/17/2020 02:35 PM 7.6 (A) 0.0 - 7.0 % Final   Hgb A1c MFr Bld  Date/Time Value Ref Range Status  06/20/2020 09:15 AM 9.4 (H) 4.8 - 5.6 % Final    Comment:             Prediabetes: 5.7 - 6.4          Diabetes: >6.4          Glycemic control for adults with diabetes: <7.0   10/22/2019 10:34 AM 7.6 (H) 4.8 - 5.6 % Final    Comment:    (NOTE) Pre diabetes:          5.7%-6.4%  Diabetes:              >  6.4%  Glycemic control for   <7.0% adults with diabetes     CBG: Recent Labs  Lab 01/11/21 0254 01/11/21 0557  GLUCAP 194* 375*    Review of Systems:   Unable to obtain ROS due to patient status.  Past Medical History:  She,  has a past medical history of Acute respiratory failure with hypoxia ([place]) (10/22/2019), Anemia, Arthritis, Chronic kidney disease, Diabetes mellitus type II, Dysphagia, GERD (gastroesophageal reflux disease), Hyperlipidemia (2000), Hypertension (1995), Insomnia, Low back pain, Obesity, PE (pulmonary thromboembolism) ([place]), Sleep apnea in adult (11/25/2019), and Thyroid nodule (11/25/2019).   Surgical History:   Past Surgical History:  Procedure Laterality Date   Carpal tunnel release     left    CHOLECYSTECTOMY  2007   DIGIT NAIL REMOVAL  08/2011   KNEE SURGERY  1.20.2012   arthroscopy LEFT knee partial medial meniscectomy   TENOTOMY     2,3,4  left foot    toenail removal     TUBAL LIGATION       Social History:   reports that she has never smoked. She has never used smokeless tobacco. She reports that she does not drink alcohol and does not use drugs.   Family History:  Her family history includes Dementia in her sister; Diabetes in her brother, brother, father, mother, and sister; Heart disease in her  mother; Hypertension in her mother; Kidney disease in her brother; Kidney disease (age of onset: 70) in her mother.   Allergies No Known Allergies   Home Medications  Prior to Admission medications   Medication Sig Start Date End Date Taking? Authorizing Provider  acetaminophen (TYLENOL) 325 MG tablet Take 2 tablets (650 mg total) by mouth every 6 (six) hours as needed for mild pain, fever or headache (or Fever >/= 101). 10/28/19   Roxan Hockey, MD  amLODipine (NORVASC) 10 MG tablet TAKE ONE TABLET BY MOUTH ONCE DAILY FOR BLOOD PRESSURE. 11/17/20   Fayrene Helper, MD  blood glucose meter kit and supplies Dispense based on patient and insurance preference. Use up to four times daily as directed. (FOR ICD-10 E10.9, E11.9). 08/15/20   Fayrene Helper, MD  cholecalciferol (VITAMIN D) 1000 UNITS tablet Take 1,000 Units by mouth daily.    [provider]  cloNIDine (CATAPRES) 0.3 MG tablet TAKE (1) TABLET BY MOUTH 3 TIMES DAILY. 11/17/20   Fayrene Helper, MD  fluticasone (FLONASE) 50 MCG/ACT nasal spray Place 2 sprays into both nostrils daily as needed. 02/28/18   Fayrene Helper, MD  gabapentin (NEURONTIN) 100 MG capsule Take 1 capsule (100 mg total) by mouth 2 (two) times daily. 07/16/20   Fayrene Helper, MD  glipiZIDE (GLUCOTROL XL) 2.5 MG 24 hr tablet TAKE (1) TABLET BY MOUTH ONCE DAILY. 11/17/20   Fayrene Helper, MD  glucose blood (ACCU-CHEK GUIDE) test strip USE TO TEST BLOOD SUGAR THREE TIMES DAILY AS DIRECTED      DX E11.65 11/19/20   Fayrene Helper, MD  insulin glargine (LANTUS SOLOSTAR) 100 UNIT/ML Solostar Pen INJECT 60 UNITS INTO THE SKIN ONCE DAILY. 12/31/20   Fayrene Helper, MD  lisinopril (ZESTRIL) 20 MG tablet Take 1 tablet (20 mg total) by mouth daily. 11/17/20   Fayrene Helper, MD  pantoprazole (PROTONIX) 40 MG tablet Take 1 tablet (40 mg total) by mouth daily. 10/29/19   Roxan Hockey, MD  polyethylene glycol (MIRALAX / GLYCOLAX) 17 g  packet Take 17 g by mouth daily. 10/29/19   Emokpae,  Courage, MD  pravastatin (PRAVACHOL) 40 MG tablet TAKE ONE TABLET BY MOUTH ONCE DAILY. 12/22/20   Fayrene Helper, MD  ramipril (ALTACE) 10 MG capsule TAKE (1) CAPSULE BY MOUTH ONCE DAILY. 11/17/20   Fayrene Helper, MD  senna-docusate (SENOKOT-S) 8.6-50 MG tablet Take 2 tablets by mouth 2 (two) times daily. 10/28/19   Roxan Hockey, MD  spironolactone-hydrochlorothiazide (ALDACTAZIDE) 25-25 MG tablet Take 1 tablet by mouth daily. 11/17/20   Fayrene Helper, MD  sitaGLIPtan (JANUVIA) 100 MG tablet Take 100 mg by mouth daily.    07/22/11  [provider]     Critical care time: 24 minutes    [employer]., MSN, APRN, AGACNP-BC [facility]  01/11/2021 , 6:24 AM  Please see [email or web address] for pager details  If no response, please call [phone] After hours, please call [place] at [phone]

## 2021-01-11 NOTE — Code Documentation (Signed)
cpr in progress, no pulse noted, asystole on monitor,

## 2021-01-11 NOTE — Code Documentation (Signed)
Pt asystole on monitor, cpr started,

## 2021-01-11 NOTE — Progress Notes (Signed)
  Echocardiogram 2D Echocardiogram has been performed.  Kathleen [name] 01/11/2021, 2:38 PM

## 2021-01-11 NOTE — ED Notes (Addendum)
Verbal approval by edp for second 1mg  of versed .

## 2021-01-11 NOTE — Progress Notes (Signed)
Pt was weaning well off of vasopressors through her IO line until approx 1200 when her BP began to drop. Adjusted BP cuff and reassessed and BP low. Pt c/o pain in left leg, noted boggy and cool. MD notified, advised to run vasopressors in other PIV due to lack of central access post TPA. BP still low after changing infusion site, increased to 22 mcg from 6 mcg over approx 30 mins. MD notified again, orders obtained for 1L LR bolus. During this period of hypotension she retained consciousness, able to communicate clearly despite being intubated.   Pt has had persistent nausea, vomiting, and diarrhea since arrival to unit. Mg was replaced this morning, but K was not at 3.4. Obtained order to collect BMP to assess. Difficulty obtaining blood due to PIV draw difficulty, unable to do lab sticks due to recent TPA. MD aware of difficulty obtaining blood for labs.  Family updated at bedside of pt's status, all questions answered at this time.

## 2021-01-11 NOTE — Progress Notes (Signed)
Called in report to Maudie Mercury, RRT at [facility].  Patient to be going to 2M14.

## 2021-01-11 NOTE — Code Documentation (Signed)
Pulses noted, tachy rhythm noted, Dr Wyvonnia Dusky at bedside,

## 2021-01-11 NOTE — Code Documentation (Signed)
Pt asystole on monitor, cpr started, Dr Wyvonnia Dusky at bedside

## 2021-01-11 NOTE — Code Documentation (Signed)
Pt asystole on monitor, cpr restarted,

## 2021-01-12 ENCOUNTER — Inpatient Hospital Stay (HOSPITAL_COMMUNITY): Payer: Medicare Other

## 2021-01-12 DIAGNOSIS — J9602 Acute respiratory failure with hypercapnia: Secondary | ICD-10-CM | POA: Diagnosis not present

## 2021-01-12 DIAGNOSIS — I2699 Other pulmonary embolism without acute cor pulmonale: Secondary | ICD-10-CM | POA: Diagnosis not present

## 2021-01-12 LAB — CBC
HCT: 32.1 % — ABNORMAL LOW (ref 36.0–46.0)
Hemoglobin: 10.4 g/dL — ABNORMAL LOW (ref 12.0–15.0)
MCH: 26.5 pg (ref 26.0–34.0)
MCHC: 32.4 g/dL (ref 30.0–36.0)
MCV: 81.9 fL (ref 80.0–100.0)
Platelets: 178 10*3/uL (ref 150–400)
RBC: 3.92 MIL/uL (ref 3.87–5.11)
RDW: 13.9 % (ref 11.5–15.5)
WBC: 13.3 10*3/uL — ABNORMAL HIGH (ref 4.0–10.5)
nRBC: 0 % (ref 0.0–0.2)

## 2021-01-12 LAB — MAGNESIUM: Magnesium: 1.5 mg/dL — ABNORMAL LOW (ref 1.7–2.4)

## 2021-01-12 LAB — GLUCOSE, CAPILLARY
Glucose-Capillary: 90 mg/dL (ref 70–99)
Glucose-Capillary: 65 mg/dL — ABNORMAL LOW (ref 70–99)
Glucose-Capillary: 78 mg/dL (ref 70–99)
Glucose-Capillary: 67 mg/dL — ABNORMAL LOW (ref 70–99)
Glucose-Capillary: 74 mg/dL (ref 70–99)
Glucose-Capillary: 84 mg/dL (ref 70–99)
Glucose-Capillary: 99 mg/dL (ref 70–99)
Glucose-Capillary: 119 mg/dL — ABNORMAL HIGH (ref 70–99)

## 2021-01-12 LAB — POCT I-STAT 7, (LYTES, BLD GAS, ICA,H+H)
Acid-base deficit: 2 mmol/L (ref 0.0–2.0)
Bicarbonate: 20.6 mmol/L (ref 20.0–28.0)
Calcium, Ion: 1.13 mmol/L — ABNORMAL LOW (ref 1.15–1.40)
HCT: 31 % — ABNORMAL LOW (ref 36.0–46.0)
Hemoglobin: 10.5 g/dL — ABNORMAL LOW (ref 12.0–15.0)
O2 Saturation: 96 %
Patient temperature: 99
Potassium: 4.3 mmol/L (ref 3.5–5.1)
Sodium: 139 mmol/L (ref 135–145)
TCO2: 21 mmol/L — ABNORMAL LOW (ref 22–32)
pCO2 arterial: 27.6 mmHg — ABNORMAL LOW (ref 32.0–48.0)
pH, Arterial: 7.481 — ABNORMAL HIGH (ref 7.350–7.450)
pO2, Arterial: 75 mmHg — ABNORMAL LOW (ref 83.0–108.0)

## 2021-01-12 LAB — COMPREHENSIVE METABOLIC PANEL
ALT: 124 U/L — ABNORMAL HIGH (ref 0–44)
AST: 87 U/L — ABNORMAL HIGH (ref 15–41)
Albumin: 2.4 g/dL — ABNORMAL LOW (ref 3.5–5.0)
Alkaline Phosphatase: 80 U/L (ref 38–126)
Anion gap: 13 (ref 5–15)
BUN: 45 mg/dL — ABNORMAL HIGH (ref 8–23)
CO2: 23 mmol/L (ref 22–32)
Calcium: 8.4 mg/dL — ABNORMAL LOW (ref 8.9–10.3)
Chloride: 102 mmol/L (ref 98–111)
Creatinine, Ser: 4.36 mg/dL — ABNORMAL HIGH (ref 0.44–1.00)
GFR, Estimated: 10 mL/min — ABNORMAL LOW (ref >60)
Glucose, Bld: 81 mg/dL (ref 70–99)
Potassium: 4.6 mmol/L (ref 3.5–5.1)
Sodium: 138 mmol/L (ref 135–145)
Total Bilirubin: 0.4 mg/dL (ref 0.3–1.2)
Total Protein: 5.4 g/dL — ABNORMAL LOW (ref 6.5–8.1)

## 2021-01-12 LAB — HEPARIN LEVEL (UNFRACTIONATED)
Heparin Unfractionated: 0.65 IU/mL (ref 0.30–0.70)
Heparin Unfractionated: 0.78 IU/mL — ABNORMAL HIGH (ref 0.30–0.70)
Heparin Unfractionated: 0.31 IU/mL (ref 0.30–0.70)

## 2021-01-12 LAB — HEMOGLOBIN A1C
Hgb A1c MFr Bld: 7.6 % — ABNORMAL HIGH (ref 4.8–5.6)
Mean Plasma Glucose: 171.42 mg/dL

## 2021-01-12 MED ORDER — VITAMIN D 25 MCG (1000 UNIT) PO TABS
1000.0000 [IU] | ORAL_TABLET | Freq: Every day | ORAL | Status: DC
  Administered 2021-01-12 – 2021-01-27 (×16): 1000 [IU] via ORAL
  Filled 2021-01-12 (×16): qty 1

## 2021-01-12 MED ORDER — ALBUTEROL SULFATE (2.5 MG/3ML) 0.083% IN NEBU
INHALATION_SOLUTION | RESPIRATORY_TRACT | Status: AC
  Administered 2021-01-12: 2.5 mg
  Filled 2021-01-12: qty 3

## 2021-01-12 MED ORDER — ORAL CARE MOUTH RINSE
15.0000 mL | Freq: Two times a day (BID) | OROMUCOSAL | Status: DC
  Administered 2021-01-12 – 2021-01-26 (×23): 15 mL via OROMUCOSAL

## 2021-01-12 MED ORDER — MAGNESIUM SULFATE 2 GM/50ML IV SOLN
2.0000 g | Freq: Once | INTRAVENOUS | Status: AC
  Administered 2021-01-12: 2 g via INTRAVENOUS
  Filled 2021-01-12: qty 50

## 2021-01-12 MED ORDER — DEXTROSE 50 % IV SOLN: 12.5000 mL | Freq: Once | INTRAVENOUS | Status: AC

## 2021-01-12 MED ORDER — IPRATROPIUM-ALBUTEROL 0.5-2.5 (3) MG/3ML IN SOLN
RESPIRATORY_TRACT | Status: AC
  Administered 2021-01-12: 3 mL
  Filled 2021-01-12: qty 3

## 2021-01-12 MED ORDER — ASPIRIN EC 81 MG PO TBEC
81.0000 mg | DELAYED_RELEASE_TABLET | Freq: Every day | ORAL | Status: DC
  Administered 2021-01-12 – 2021-01-27 (×16): 81 mg via ORAL
  Filled 2021-01-12 (×16): qty 1

## 2021-01-12 MED ORDER — IPRATROPIUM-ALBUTEROL 0.5-2.5 (3) MG/3ML IN SOLN
3.0000 mL | RESPIRATORY_TRACT | Status: DC
  Administered 2021-01-13 (×3): 3 mL via RESPIRATORY_TRACT
  Filled 2021-01-12 (×3): qty 3

## 2021-01-12 MED ORDER — IPRATROPIUM-ALBUTEROL 0.5-2.5 (3) MG/3ML IN SOLN
3.0000 mL | RESPIRATORY_TRACT | Status: DC | PRN
  Administered 2021-01-12: 3 mL via RESPIRATORY_TRACT
  Filled 2021-01-12: qty 3

## 2021-01-12 MED ORDER — AMLODIPINE BESYLATE 10 MG PO TABS
10.0000 mg | ORAL_TABLET | Freq: Every day | ORAL | Status: DC
  Administered 2021-01-12 – 2021-01-27 (×16): 10 mg via ORAL
  Filled 2021-01-12 (×16): qty 1

## 2021-01-12 MED ORDER — INSULIN ASPART 100 UNIT/ML IJ SOLN
0.0000 [IU] | Freq: Every day | INTRAMUSCULAR | Status: DC
  Administered 2021-01-16 – 2021-01-26 (×8): 2 [IU] via SUBCUTANEOUS

## 2021-01-12 MED ORDER — INSULIN ASPART 100 UNIT/ML IJ SOLN
0.0000 [IU] | Freq: Three times a day (TID) | INTRAMUSCULAR | Status: DC
  Administered 2021-01-13: 7 [IU] via SUBCUTANEOUS
  Administered 2021-01-13 – 2021-01-14 (×3): 4 [IU] via SUBCUTANEOUS
  Administered 2021-01-14: 3 [IU] via SUBCUTANEOUS
  Administered 2021-01-15 (×2): 7 [IU] via SUBCUTANEOUS
  Administered 2021-01-15 – 2021-01-16 (×2): 4 [IU] via SUBCUTANEOUS
  Administered 2021-01-16 – 2021-01-17 (×4): 7 [IU] via SUBCUTANEOUS
  Administered 2021-01-18: 4 [IU] via SUBCUTANEOUS
  Administered 2021-01-18: 7 [IU] via SUBCUTANEOUS
  Administered 2021-01-18: 11 [IU] via SUBCUTANEOUS
  Administered 2021-01-19: 2 [IU] via SUBCUTANEOUS
  Administered 2021-01-19: 7 [IU] via SUBCUTANEOUS
  Administered 2021-01-19 – 2021-01-20 (×2): 4 [IU] via SUBCUTANEOUS
  Administered 2021-01-20: 7 [IU] via SUBCUTANEOUS
  Administered 2021-01-20 – 2021-01-21 (×2): 4 [IU] via SUBCUTANEOUS
  Administered 2021-01-21: 7 [IU] via SUBCUTANEOUS
  Administered 2021-01-21: 4 [IU] via SUBCUTANEOUS
  Administered 2021-01-22: 7 [IU] via SUBCUTANEOUS
  Administered 2021-01-22: 4 [IU] via SUBCUTANEOUS
  Administered 2021-01-22: 7 [IU] via SUBCUTANEOUS
  Administered 2021-01-23: 11 [IU] via SUBCUTANEOUS
  Administered 2021-01-23 – 2021-01-24 (×3): 4 [IU] via SUBCUTANEOUS
  Administered 2021-01-24: 7 [IU] via SUBCUTANEOUS
  Administered 2021-01-25: 4 [IU] via SUBCUTANEOUS
  Administered 2021-01-25: 7 [IU] via SUBCUTANEOUS
  Administered 2021-01-25 – 2021-01-26 (×2): 4 [IU] via SUBCUTANEOUS
  Administered 2021-01-26 – 2021-01-27 (×3): 7 [IU] via SUBCUTANEOUS
  Administered 2021-01-27: 4 [IU] via SUBCUTANEOUS

## 2021-01-12 MED ORDER — DEXTROSE 50 % IV SOLN
INTRAVENOUS | Status: AC
  Administered 2021-01-12: 12.5 mL via INTRAVENOUS
  Filled 2021-01-12: qty 50

## 2021-01-12 NOTE — Progress Notes (Signed)
Updated pt's family about current status and treatment plan.  Explained oxygenation and hemodynamics improving, but renal function remains an issue.  Chesley Mires, MD [place] Pager - [phone] 01/12/2021, 2:33 PM

## 2021-01-12 NOTE — Procedures (Signed)
Extubation Procedure Note  Patient Details:   Name: Kathleen [name] DOB: [birth year] MRN: [id number]   Airway Documentation:  Airway 7.5 mm (Active)  Secured at (cm) 23 cm 01/12/21 0800  Measured From Lips 01/12/21 0800  Secured Location Left 01/12/21 0735  Secured By [employer] 01/12/21 0735  Tube Holder Repositioned Yes 01/12/21 0735  Prone position No 01/12/21 0735  Cuff Pressure (cm H2O) Clear OR 27-39 CmH2O 01/12/21 0735  Site Condition Dry 01/12/21 0800   Vent end date: (not recorded) Vent end time: (not recorded)   Evaluation  O2 sats: stable throughout Complications: No apparent complications Patient did tolerate procedure well. Bilateral Breath Sounds: Diminished, Rhonchi   Yes  Pt following all commands, audible cuff leak, extubated to 4 L [place] and able to speak without stridor.  Will continue to monitor sat 100%  Ned Grace 01/12/2021, 9:07 AM

## 2021-01-12 NOTE — H&P (Signed)
NAME:  Kathleen [name], MRN:  [id number], DOB:  Jan 14, 1951, LOS: 1 ADMISSION DATE:  01/10/2021, CONSULTATION DATE:  9/4 REFERRING MD:  Rancour MD , CHIEF COMPLAINT:  Dizziness and SOB   History of Present Illness:  70 yo female with hx of PE and was transitioned off eliquis in July 2022 presented to [facility] with dyspnea and dizziness.  Found to have recurrent PE on CT angiogram chest with RV strain.  Developed cardiac arrest with ROSC after about 30 minutes.  She received thrombolytic therapy.  Intubated and started on pressors.  Transferred to [facility] for further management.  Pertinent  Medical History  Unprovoked PE (June 2021), HTN, CKD, HLD, GERD, thyroid nodule, DM type II  Significant Hospital Events: Including procedures, antibiotic start and stop dates in addition to other pertinent events   9/3 presented 9/4 Asystole arrest, TPA 100mg , intubated 9/5 off pressors, extubated  Studies:  CT angio chest 9/04 >> b/l PE, RV:LV ratio 2.7 CT head 9/04 >> mild white matter disease Doppler legs 9/04 >> no evidence of DVT Echo 9/04 >> EF 55 to 60%, mod LVH, grade 1 DD, positive McConnell's sign, mod RV systolic dysfunction, moderate elevation in PASP, mod/severe TR  Interim History / Subjective:  Off pressors, on pressure support.  Objective   Blood pressure 131/70, pulse 98, temperature 98.9 F (37.2 C), temperature source Oral, resp. rate (!) 21, height 5\' 6"  (1.676 m), weight 105 kg, SpO2 100 %.    Vent Mode: CPAP;PSV FiO2 (%):  [40 %-70 %] 40 % Set Rate:  [28 bmp-35 bmp] 28 bmp Vt Set:  [470 mL] 470 mL PEEP:  [5 cmH20] 5 cmH20 Pressure Support:  [5 cmH20] 5 cmH20 Plateau Pressure:  [19 cmH20-25 cmH20] 19 cmH20   Intake/Output Summary (Last 24 hours) at 01/12/2021 0902 Last data filed at 01/12/2021 0800 Gross per 24 hour  Intake 4397.61 ml  Output --  Net 4397.61 ml   Filed Weights   01/10/21 2300 01/12/21 0549  Weight: 98.9 kg 105 kg    Examination:  General - alert Eyes -  pupils reactive ENT - ETT in place Cardiac - regular rate/rhythm, no murmur Chest - equal breath sounds b/l, no wheezing or rales Abdomen - soft, non tender, + bowel sounds Extremities - no cyanosis, clubbing, or edema Skin - no rashes Neuro - normal strength, moves extremities, follows commands  Resolved Hospital Problem list   Cardiogenic shock, Asystolic cardiac arrest, Acute hypoxic encephalopathy  Assessment & Plan:   Acute hypoxic/hypercapnic respiratory failure in setting of acute pulmonary embolism with acute cor pulmonale. Hx of pulmonary embolism in 2021. - proceed with extubation on 9/05 - goal SpO2 > 92% - continue heparin gtt for now; she will need indefinite anticoagulation  Acute cor pulmonale from acute PE. Hx of HTN. - continue ASA - hold outpt norvasc, catapres, lisinopril, spironolactone, HCTZ, pravachol for now  AKI with ATN from cardiogenic shock. - no acute indications for renal replacement at this time; defer nephrology consult for now - continue IV fluids - f/u BMET  DM type 2. DM neuropathy. - SSI - hold outpt neurontin  Hx of GERD - protonix  Hx thyroid nodule. - will need f/u as outpt for scheduled biopsy  Best Practice (right click and "Reselect all SmartList Selections" daily)   Diet/type: NPO DVT prophylaxis: systemic heparin GI prophylaxis: PPI Lines: N/A Foley:  N/A Code Status:  full code Last date of multidisciplinary goals of care discussion updated family on 9/04  Labs    CMP Latest Ref Rng & Units 01/12/2021 01/12/2021 01/11/2021  Glucose 70 - 99 mg/dL 81 - 337(H)  BUN 8 - 23 mg/dL 45(H) - 39(H)  Creatinine 0.44 - 1.00 mg/dL 4.36(H) - 2.86(H)  Sodium 135 - 145 mmol/L 138 139 139  Potassium 3.5 - 5.1 mmol/L 4.6 4.3 4.0  Chloride 98 - 111 mmol/L 102 - 102  CO2 22 - 32 mmol/L 23 - 18(L)  Calcium 8.9 - 10.3 mg/dL 8.4(L) - 8.4(L)  Total Protein 6.5 - 8.1 g/dL 5.4(L) - -  Total Bilirubin 0.3 - 1.2 mg/dL 0.4 - -  Alkaline Phos 38  - 126 U/L 80 - -  AST 15 - 41 U/L 87(H) - -  ALT 0 - 44 U/L 124(H) - -    CBC Latest Ref Rng & Units 01/12/2021 01/12/2021 01/11/2021  WBC 4.0 - 10.5 K/uL 13.3(H) - -  Hemoglobin 12.0 - 15.0 g/dL 10.4(L) 10.5(L) 12.2  Hematocrit 36.0 - 46.0 % 32.1(L) 31.0(L) 36.0  Platelets 150 - 400 K/uL 178 - -    ABG    Component Value Date/Time   PHART 7.481 (H) 01/12/2021 0341   PCO2ART 27.6 (L) 01/12/2021 0341   PO2ART 75 (L) 01/12/2021 0341   HCO3 20.6 01/12/2021 0341   TCO2 21 (L) 01/12/2021 0341   ACIDBASEDEF 2.0 01/12/2021 0341   O2SAT 96.0 01/12/2021 0341    CBG (last 3)  Recent Labs    01/12/21 0330 01/12/21 0754 01/12/21 0826  GLUCAP 90 65* 78   Critical care time: 33 minutes  Chesley Mires, MD [place] Pager - [phone] 01/12/2021, 9:15 AM

## 2021-01-12 NOTE — Progress Notes (Signed)
ANTICOAGULATION CONSULT NOTE - Follow Up Consult  Pharmacy Consult for IV Heparin Indication: pulmonary embolus  No Known Allergies  Patient Measurements: Height: 5\' 6"  (167.6 cm) Weight: 105 kg (231 lb 7.7 oz) IBW/kg (Calculated) : 59.3 Heparin Dosing Weight: 81.6 kg  Vital Signs: Temp: 98.9 F (37.2 C) (09/05 0340) Temp Source: Oral (09/05 0340) BP: 105/66 (09/05 0715) Pulse Rate: 69 (09/05 0715)  Labs: Recent Labs    01/10/21 2316 01/11/21 0120 01/11/21 0655 01/11/21 0700 01/11/21 0842 01/11/21 1300 01/11/21 1800 01/12/21 0341 01/12/21 0557  HGB 11.3* 11.3* 11.3* 12.2  --   --   --  10.5* 10.4*  HCT 36.6 36.1 36.4 36.0  --   --   --  31.0* 32.1*  PLT 219 232 224  --   --   --   --   --  178  APTT  --   --  114*  --  79*  --   --   --   --   LABPROT  --   --  >90.0*  --   --   --   --   --   --   INR  --   --  >10.0*  --   --   --   --   --   --   HEPARINUNFRC  --   --   --   --   --   --  0.15*  --  0.65  CREATININE 1.90*  --  2.31*  --   --  2.86*  --   --  4.36*  TROPONINIHS 19* 77* 390*  --   --   --   --   --   --     Estimated Creatinine Clearance: 14.5 mL/min (A) (by C-G formula based on SCr of 4.36 mg/dL (H)).   Assessment: 70 years of age female who arrested at [facility] with confirmed PE and received alteplase 100mg   at 03:00 AM. Patient has had two aPTTs, last down to 79. CBC within normal limits. Patient with nausea and vomiting - sent for stat CT Head which was reviewed by Dr. Halford Chessman. Dr. Halford Chessman gave verbal order to proceed with IV heparin therapy.  Heparin level 0.65 after increase to 1300 units/hr. Now >24 hours since [place], ok for goal 0.3 to 0.7. AKI continues to worsen with SCr up to 4.36 -- at risk for accumulation.   Goal of Therapy:  Heparin level 0.3-0.7 units/ml (>24hrs post Alteplase) Monitor platelets by anticoagulation protocol: Yes   Plan:  Continue IV Heparin  1300 units/hr.  Recheck HL to rule out accumulation this PM.  Daily  Heparin level and CBC while on therapy.  Mag back down to 1.5 (severe AKI) -discussed with Dr. Halford Chessman, replace with 2g IV x1.   Sloan Leiter, PharmD, BCPS, BCCCP Clinical Pharmacist Please refer to [facility] for [place] numbers 01/12/2021 7:27 AM

## 2021-01-12 NOTE — Progress Notes (Signed)
ANTICOAGULATION CONSULT NOTE - Follow Up Consult  Pharmacy Consult for IV Heparin Indication: pulmonary embolus  No Known Allergies  Patient Measurements: Height: 5\' 6"  (167.6 cm) Weight: 105 kg (231 lb 7.7 oz) IBW/kg (Calculated) : 59.3 Heparin Dosing Weight: 81.6 kg  Vital Signs: Temp: 98.9 F (37.2 C) (09/05 0340) Temp Source: Oral (09/05 0340) BP: 165/74 (09/05 1400) Pulse Rate: 101 (09/05 1400)  Labs: Recent Labs    01/10/21 2316 01/11/21 0120 01/11/21 0655 01/11/21 0700 01/11/21 0842 01/11/21 1300 01/11/21 1800 01/12/21 0341 01/12/21 0557 01/12/21 1321  HGB 11.3* 11.3* 11.3* 12.2  --   --   --  10.5* 10.4*  --   HCT 36.6 36.1 36.4 36.0  --   --   --  31.0* 32.1*  --   PLT 219 232 224  --   --   --   --   --  178  --   APTT  --   --  114*  --  79*  --   --   --   --   --   LABPROT  --   --  >90.0*  --   --   --   --   --   --   --   INR  --   --  >10.0*  --   --   --   --   --   --   --   HEPARINUNFRC  --   --   --   --   --   --  0.15*  --  0.65 0.78*  CREATININE 1.90*  --  2.31*  --   --  2.86*  --   --  4.36*  --   TROPONINIHS 19* 77* 390*  --   --   --   --   --   --   --     Estimated Creatinine Clearance: 14.5 mL/min (A) (by C-G formula based on SCr of 4.36 mg/dL (H)).   Assessment: 70 years of age female who arrested at [facility] with confirmed PE and received alteplase 100mg   at 03:00 AM. Patient has had two aPTTs, last down to 79. CBC within normal limits. Patient with nausea and vomiting - sent for stat CT Head which was reviewed by Dr. Halford Chessman. Dr. Halford Chessman gave verbal order to proceed with IV heparin therapy.   Heparin level recheck is supra-therapeutic at 0.78 on 1300 units/hr - confirming accumulation with AKI and worsening renal function.   Goal of Therapy:  Heparin level 0.3-0.7 units/ml (>24hrs post Alteplase) Monitor platelets by anticoagulation protocol: Yes   Plan:  Continue IV Heparin  1100 units/hr.  Recheck HL this PM.  Daily Heparin  level and CBC while on therapy.   Sloan Leiter, PharmD, BCPS, BCCCP Clinical Pharmacist Please refer to [facility] for [place] numbers 01/12/2021 2:36 PM

## 2021-01-12 NOTE — Progress Notes (Signed)
eLink Physician-Brief Progress Note Patient Name: Kathleen [name] DOB: [id number] MRN: [id number]   Date of Service  01/12/2021  HPI/Events of Note  Pt noted to be > 6000cc net (+) since admission.    eICU Interventions  Holding IV fluids for the rest of the night     Intervention Category Intermediate Interventions: Oliguria - evaluation and management  Tilden Dome 01/12/2021, 10:17 PM

## 2021-01-12 NOTE — Progress Notes (Signed)
Hypoglycemic Event  CBG: 1120; 67  Treatment: 4 oz juice/soda  Symptoms: None  Follow-up CBG: Time:1147 CBG Result:74  Possible Reasons for Event: Inadequate meal intake, diet initiated post extubation  Comments/MD notified: Dr. Ardis Rowan

## 2021-01-12 NOTE — Progress Notes (Signed)
Hypoglycemic Event  CBG: 65  Treatment: 12.5 mL 50% dextrose per pharmacy   Symptoms: None  Follow-up CBG: Time:0827 CBG Result:78  Possible Reasons for Event: Inadequate meal intake  Comments/MD notified: Dr. Ardis Rowan

## 2021-01-13 ENCOUNTER — Inpatient Hospital Stay (HOSPITAL_COMMUNITY): Payer: Medicare Other

## 2021-01-13 ENCOUNTER — Encounter: Payer: Self-pay | Admitting: Family Medicine

## 2021-01-13 DIAGNOSIS — I2699 Other pulmonary embolism without acute cor pulmonale: Secondary | ICD-10-CM | POA: Diagnosis not present

## 2021-01-13 LAB — URINALYSIS, ROUTINE W REFLEX MICROSCOPIC
Bilirubin Urine: NEGATIVE
Glucose, UA: NEGATIVE mg/dL
Ketones, ur: NEGATIVE mg/dL
Nitrite: NEGATIVE
Protein, ur: NEGATIVE mg/dL
Specific Gravity, Urine: 1.015 (ref 1.005–1.030)
pH: 6 (ref 5.0–8.0)

## 2021-01-13 LAB — BASIC METABOLIC PANEL
Anion gap: 12 (ref 5–15)
BUN: 49 mg/dL — ABNORMAL HIGH (ref 8–23)
CO2: 19 mmol/L — ABNORMAL LOW (ref 22–32)
Calcium: 7.8 mg/dL — ABNORMAL LOW (ref 8.9–10.3)
Chloride: 103 mmol/L (ref 98–111)
Creatinine, Ser: 5.43 mg/dL — ABNORMAL HIGH (ref 0.44–1.00)
GFR, Estimated: 8 mL/min — ABNORMAL LOW (ref >60)
Glucose, Bld: 176 mg/dL — ABNORMAL HIGH (ref 70–99)
Potassium: 4.9 mmol/L (ref 3.5–5.1)
Sodium: 134 mmol/L — ABNORMAL LOW (ref 135–145)

## 2021-01-13 LAB — GLUCOSE, CAPILLARY
Glucose-Capillary: 136 mg/dL — ABNORMAL HIGH (ref 70–99)
Glucose-Capillary: 189 mg/dL — ABNORMAL HIGH (ref 70–99)
Glucose-Capillary: 209 mg/dL — ABNORMAL HIGH (ref 70–99)
Glucose-Capillary: 189 mg/dL — ABNORMAL HIGH (ref 70–99)
Glucose-Capillary: 171 mg/dL — ABNORMAL HIGH (ref 70–99)
Glucose-Capillary: 175 mg/dL — ABNORMAL HIGH (ref 70–99)

## 2021-01-13 LAB — HEMOGLOBIN A1C
Hgb A1c MFr Bld: 7.8 % — ABNORMAL HIGH (ref 4.8–5.6)
Mean Plasma Glucose: 177 mg/dL

## 2021-01-13 LAB — CBC
HCT: 28.4 % — ABNORMAL LOW (ref 36.0–46.0)
Hemoglobin: 9.1 g/dL — ABNORMAL LOW (ref 12.0–15.0)
MCH: 27.1 pg (ref 26.0–34.0)
MCHC: 32 g/dL (ref 30.0–36.0)
MCV: 84.5 fL (ref 80.0–100.0)
Platelets: 167 10*3/uL (ref 150–400)
RBC: 3.36 MIL/uL — ABNORMAL LOW (ref 3.87–5.11)
RDW: 14.4 % (ref 11.5–15.5)
WBC: 11.9 10*3/uL — ABNORMAL HIGH (ref 4.0–10.5)
nRBC: 0 % (ref 0.0–0.2)

## 2021-01-13 LAB — URINALYSIS, MICROSCOPIC (REFLEX)

## 2021-01-13 LAB — HEPARIN LEVEL (UNFRACTIONATED)
Heparin Unfractionated: 0.28 IU/mL — ABNORMAL LOW (ref 0.30–0.70)
Heparin Unfractionated: 0.4 IU/mL (ref 0.30–0.70)

## 2021-01-13 MED ORDER — ACETAMINOPHEN 325 MG PO TABS
650.0000 mg | ORAL_TABLET | Freq: Once | ORAL | Status: AC
  Administered 2021-01-13: 650 mg via ORAL
  Filled 2021-01-13: qty 2

## 2021-01-13 MED ORDER — SODIUM BICARBONATE 650 MG PO TABS
1300.0000 mg | ORAL_TABLET | Freq: Two times a day (BID) | ORAL | Status: DC
  Administered 2021-01-13 – 2021-01-14 (×3): 1300 mg via ORAL
  Filled 2021-01-13 (×3): qty 2

## 2021-01-13 MED ORDER — IPRATROPIUM-ALBUTEROL 0.5-2.5 (3) MG/3ML IN SOLN
3.0000 mL | RESPIRATORY_TRACT | Status: DC | PRN
  Administered 2021-01-13 – 2021-01-15 (×4): 3 mL via RESPIRATORY_TRACT
  Filled 2021-01-13 (×4): qty 3

## 2021-01-13 MED ORDER — SODIUM BICARBONATE 650 MG PO TABS
650.0000 mg | ORAL_TABLET | Freq: Three times a day (TID) | ORAL | Status: DC
  Administered 2021-01-13: 650 mg via ORAL
  Filled 2021-01-13: qty 1

## 2021-01-13 MED ORDER — ACETAMINOPHEN 500 MG PO TABS
1000.0000 mg | ORAL_TABLET | Freq: Two times a day (BID) | ORAL | Status: AC
  Administered 2021-01-13 – 2021-01-14 (×3): 1000 mg via ORAL
  Filled 2021-01-13 (×3): qty 2

## 2021-01-13 NOTE — Progress Notes (Signed)
Kathleen [name]     MRN: [id number]      DOB: 1950-07-04   HPI Kathleen [name] is here for follow up and re-evaluation of chronic medical conditions, medication management and review of any available recent lab and radiology data.  Preventive health is updated, specifically  Cancer screening and Immunization.   The PT denies any adverse reactions to current medications since the last visit.  Has left thyroid nodule for which biopsy was recommended 1 year ago, urgent need for biopsy explained and appointment scheduled at the visit. She reports no difficulty swallowing but does state she will have the biopsy done Denies polyuria, polydipsia, blurred vision , or hypoglycemic episodes.   ROS Denies recent fever or chills. Denies sinus pressure, nasal congestion, ear pain or sore throat. Denies chest congestion, productive cough or wheezing. Denies chest pains, palpitations and leg swelling Denies abdominal pain, nausea, vomiting,diarrhea or constipation.   Denies dysuria, frequency, hesitancy or incontinence. Denies uncontrolled joint pain, swelling and limitation in mobility. Denies headaches, seizures, numbness, or tingling. Denies depression, anxiety or insomnia. Denies skin break down or rash.   PE  BP 131/66   Pulse 64   Resp 16   Ht 5\' 6"  (1.676 m)   Wt 218 lb (98.9 kg)   SpO2 94%   BMI 35.19 kg/m   Patient alert and oriented and in no cardiopulmonary distress.  HEENT: No facial asymmetry, EOMI,     Neck supple .Left thyroid nodule  Chest: Clear to auscultation bilaterally.  CVS: S1, S2 no murmurs, no S3.Regular rate.  ABD: Soft non tender.   Ext: No edema  MS: Adequate ROM spine, shoulders, hips and knees.  Skin: Intact, no ulcerations or rash noted.  Psych: Good eye contact, normal affect. Memory intact not anxious or depressed appearing.  CNS: CN 2-12 intact, power,  normal throughout.no focal deficits noted.   Assessment & Plan  Thyroid nodule Biopsy  recommended in 2021, referral on asap basis made and I explained the importance of follow through, will also have endo follow up  Essential hypertension Controlled, no change in medication   Type 2 diabetes mellitus ([place]) Kathleen [name] is reminded of the importance of commitment to daily physical activity for 30 minutes or more, as able and the need to limit carbohydrate intake to 30 to 60 grams per meal to help with blood sugar control.   The need to take medication as prescribed, test blood sugar as directed, and to call between visits if there is a concern that blood sugar is uncontrolled is also discussed.   Kathleen [name] is reminded of the importance of daily foot exam, annual eye examination, and good blood sugar, blood pressure and cholesterol control. Improved but still not at goal Updated lab needed at/ before next visit.   Diabetic Labs Latest Ref Rng & Units 01/13/2021 01/12/2021 01/11/2021 01/11/2021 01/11/2021  HbA1c 4.8 - 5.6 % - 7.6(H) - - 7.8(H)  Microalbumin mg/dL - - - - -  Micro/Creat Ratio 0 - 29 mg/g creat - - - - -  Chol 100 - 199 mg/dL - - - - -  HDL >39 mg/dL - - - - -  Calc LDL 0 - 99 mg/dL - - - - -  Triglycerides 0 - 149 mg/dL - - - - -  Creatinine 0.44 - 1.00 mg/dL 5.43(H) 4.36(H) 2.86(H) 2.31(H) -   BP/Weight 01/13/2021 01/08/2021 01/02/2021 12/26/2020 11/17/2020 0/0/1749 08/11/9673  Systolic BP 916 384 665 993 152 147 175  Diastolic BP 68 66 54 78 78 77 79  Wt. (Lbs) 232.81 218 219 218.6 220 216 216  BMI 37.58 35.19 35.35 35.28 35.51 34.86 34.86   Foot/eye exam completion dates Latest Ref Rng & Units 07/15/2020 03/19/2020  Eye Exam No Retinopathy No Retinopathy -  Foot Form Completion - - Done

## 2021-01-13 NOTE — Consult Note (Signed)
[facility] Nephrology Consultation Note  Requesting MD: Freda Jackson Reason for consult: AKI on CKD  HPI:  Kathleen [name] is a 70 y.o. female.  With history of hypertension, type 2 diabetes, HLD, obesity, PE, CKD stage IIIb, who was initially presented to [facility] on 9/3 with dyspnea and dizziness, seen as a consultation for AKI on CKD.  In the ER the CT scan showed bilateral recurrent PE with a right ventricular strain.  She developed cardiac arrest with ROSC after about 30 minutes.  She received thrombolytic therapy, intubated, started on pressors and then transferred to [facility] for further evaluation.  The patient was transitioned off of Eliquis in 11/2020. She follows with a nephrologist Dr.Bhutani for CKD stage IIIb and proteinuria thought to be due to diabetic nephropathy.  Extensive serologies including ANA screen, ANCA, anti-GBM, anti-PLA 2R, hep B, hep C, HIV negative.The kidney ultrasound done on 02/2020 with mild right renal cortical thinning and cyst.  The baseline serum creatinine level seems to be around 1.6-1.75. On admission, the creatinine level was 1.90.  Patient received CT angio with contrast on 9/4.  The creatinine level continue to go up to 2.86 today.  She received IV fluid yesterday.  The urine output is around 850 cc in 24 hours.  She was subsequently extubated on 9/5.  Treating with heparin drip.  The home medication including lisinopril, Aldactone on hold. Currently on amlodipine, aspirin, vitamin D, heparin IV, insulin, inhaler, Protonix and sodium bicarbonate. Patient is sitting on chair, using around 2 L of oxygen.  She has tachycardia.  Complains of chest discomfort at the site of CPR.  She denies shortness of breath, nausea, vomiting.  She has external urinary catheter with clear urine.  Echocardiogram with EF of 55 to 60%, moderate concentric LVH, diastolic dysfunction McConnell sign consistent with acute cor pulmonale.   01/11/2021 01:00 PM  2.86 (H) 0.44 - 1.00 mg/dL Final  01/11/2021 06:55 AM 2.31 (H) 0.44 - 1.00 mg/dL Final  01/10/2021 11:16 PM 1.90 (H) 0.44 - 1.00 mg/dL Final  06/20/2020 09:15 AM 1.66 (H) 0.57 - 1.00 mg/dL Final  10/27/2019 03:38 AM 1.75 (H) 0.44 - 1.00 mg/dL Final    PMHx:   Past Medical History:  Diagnosis Date   Acute respiratory failure with hypoxia ([place]) 10/22/2019   Anemia    Arthritis    Chronic kidney disease    Diabetes mellitus type II    Dysphagia    unspecified    GERD (gastroesophageal reflux disease)    Hyperlipidemia 2000   Hypertension 1995   Insomnia    Low back pain    Obesity    PE (pulmonary thromboembolism) (HCC)    Sleep apnea in adult 11/25/2019   Thyroid nodule 11/25/2019    Past Surgical History:  Procedure Laterality Date   Carpal tunnel release     left    CHOLECYSTECTOMY  2007   DIGIT NAIL REMOVAL  08/2011   KNEE SURGERY  1.20.2012   arthroscopy LEFT knee partial medial meniscectomy   TENOTOMY     2,3,4  left foot    toenail removal     TUBAL LIGATION      Family Hx:  Family History  Problem Relation Age of Onset   Diabetes Mother    Hypertension Mother        MI   Heart disease Mother    Kidney disease Mother 69       dialysis   Diabetes Father  Diabetes Sister    Dementia Sister    Diabetes Brother    Diabetes Brother    Kidney disease Brother     Social History:  reports that she has never smoked. She has never used smokeless tobacco. She reports that she does not drink alcohol and does not use drugs.  Allergies: No Known Allergies  Medications: Prior to Admission medications   Medication Sig Start Date End Date Taking? Authorizing Provider  amLODipine (NORVASC) 10 MG tablet TAKE ONE TABLET BY MOUTH ONCE DAILY FOR BLOOD PRESSURE. Patient taking differently: Take 10 mg by mouth daily. 11/17/20  Yes Fayrene Helper, MD  aspirin EC 81 MG tablet Take 81 mg by mouth daily. Swallow whole.   Yes [provider]   cholecalciferol (VITAMIN D) 1000 UNITS tablet Take 1,000 Units by mouth daily.   Yes [provider]  cloNIDine (CATAPRES) 0.3 MG tablet TAKE (1) TABLET BY MOUTH 3 TIMES DAILY. Patient taking differently: Take 0.3 mg by mouth 3 (three) times daily. 11/17/20  Yes Fayrene Helper, MD  gabapentin (NEURONTIN) 100 MG capsule Take 1 capsule (100 mg total) by mouth 2 (two) times daily. Patient taking differently: Take 100 mg by mouth daily. 07/16/20  Yes Fayrene Helper, MD  glipiZIDE (GLUCOTROL XL) 2.5 MG 24 hr tablet TAKE (1) TABLET BY MOUTH ONCE DAILY. Patient taking differently: Take 2.5 mg by mouth daily with breakfast. 11/17/20  Yes Fayrene Helper, MD  insulin glargine (LANTUS SOLOSTAR) 100 UNIT/ML Solostar Pen INJECT 60 UNITS INTO THE SKIN ONCE DAILY. Patient taking differently: Inject 60 Units into the skin daily. 12/31/20  Yes Fayrene Helper, MD  lisinopril (ZESTRIL) 20 MG tablet Take 1 tablet (20 mg total) by mouth daily. 11/17/20  Yes Fayrene Helper, MD  pravastatin (PRAVACHOL) 40 MG tablet TAKE ONE TABLET BY MOUTH ONCE DAILY. Patient taking differently: Take 40 mg by mouth daily. 12/22/20  Yes Fayrene Helper, MD  ramipril (ALTACE) 10 MG capsule TAKE (1) CAPSULE BY MOUTH ONCE DAILY. Patient taking differently: Take 10 mg by mouth daily. 11/17/20  Yes Fayrene Helper, MD  spironolactone-hydrochlorothiazide (ALDACTAZIDE) 25-25 MG tablet Take 1 tablet by mouth daily. 11/17/20  Yes Fayrene Helper, MD  blood glucose meter kit and supplies Dispense based on patient and insurance preference. Use up to four times daily as directed. (FOR ICD-10 E10.9, E11.9). 08/15/20   Fayrene Helper, MD  glucose blood (ACCU-CHEK GUIDE) test strip USE TO TEST BLOOD SUGAR THREE TIMES DAILY AS DIRECTED      DX E11.65 11/19/20   Fayrene Helper, MD  sitaGLIPtan (JANUVIA) 100 MG tablet Take 100 mg by mouth daily.    07/22/11  [provider]    I have reviewed the  patient's current medications.  Labs:  Results for orders placed or performed during the hospital encounter of 01/10/21 (from the past 48 hour(s))  Magnesium     Status: None   Collection Time: 01/11/21  1:00 PM  Result Value Ref Range   Magnesium 1.9 1.7 - 2.4 mg/dL    Comment: Performed at [facility], [place] [address], [place], [place] [address]  Basic metabolic panel     Status: Abnormal   Collection Time: 01/11/21  1:00 PM  Result Value Ref Range   Sodium 139 135 - 145 mmol/L   Potassium 4.0 3.5 - 5.1 mmol/L   Chloride 102 98 - 111 mmol/L   CO2 18 (L) 22 - 32 mmol/L  Glucose, Bld 337 (H) 70 - 99 mg/dL    Comment: Glucose reference range applies only to samples taken after fasting for at least 8 hours.   BUN 39 (H) 8 - 23 mg/dL   Creatinine, Ser 2.86 (H) 0.44 - 1.00 mg/dL   Calcium 8.4 (L) 8.9 - 10.3 mg/dL   GFR, Estimated 17 (L) >60 mL/min    Comment: (NOTE) Calculated using the CKD-EPI Creatinine Equation (2021)    Anion gap 19 (H) 5 - 15    Comment: Performed at [facility] [address], [place], [place] [address]  Glucose, capillary     Status: Abnormal   Collection Time: 01/11/21  4:03 PM  Result Value Ref Range   Glucose-Capillary 267 (H) 70 - 99 mg/dL    Comment: Glucose reference range applies only to samples taken after fasting for at least 8 hours.  Heparin level (unfractionated)     Status: Abnormal   Collection Time: 01/11/21  6:00 PM  Result Value Ref Range   Heparin Unfractionated 0.15 (L) 0.30 - 0.70 IU/mL    Comment: (NOTE) The clinical reportable range upper limit is being lowered to >1.10 to align with the FDA approved guidance for the current laboratory assay.  If heparin results are below expected values, and patient dosage has  been confirmed, suggest follow up testing of antithrombin III levels. Performed at [facility], [place] [address] [place], [place] [address]   Glucose, capillary     Status: Abnormal   Collection  Time: 01/11/21  7:45 PM  Result Value Ref Range   Glucose-Capillary 197 (H) 70 - 99 mg/dL    Comment: Glucose reference range applies only to samples taken after fasting for at least 8 hours.  Glucose, capillary     Status: Abnormal   Collection Time: 01/11/21 11:27 PM  Result Value Ref Range   Glucose-Capillary 142 (H) 70 - 99 mg/dL    Comment: Glucose reference range applies only to samples taken after fasting for at least 8 hours.  Glucose, capillary     Status: None   Collection Time: 01/12/21  3:30 AM  Result Value Ref Range   Glucose-Capillary 90 70 - 99 mg/dL    Comment: Glucose reference range applies only to samples taken after fasting for at least 8 hours.  I-STAT 7, (LYTES, BLD GAS, ICA, H+H)     Status: Abnormal   Collection Time: 01/12/21  3:41 AM  Result Value Ref Range   pH, Arterial 7.481 (H) 7.350 - 7.450   pCO2 arterial 27.6 (L) 32.0 - 48.0 mmHg   pO2, Arterial 75 (L) 83.0 - 108.0 mmHg   Bicarbonate 20.6 20.0 - 28.0 mmol/L   TCO2 21 (L) 22 - 32 mmol/L   O2 Saturation 96.0 %   Acid-base deficit 2.0 0.0 - 2.0 mmol/L   Sodium 139 135 - 145 mmol/L   Potassium 4.3 3.5 - 5.1 mmol/L   Calcium, Ion 1.13 (L) 1.15 - 1.40 mmol/L   HCT 31.0 (L) 36.0 - 46.0 %   Hemoglobin 10.5 (L) 12.0 - 15.0 g/dL   Patient temperature 99.0 F    Collection site Radial    Drawn by Operator    Sample type ARTERIAL   Comprehensive metabolic panel     Status: Abnormal   Collection Time: 01/12/21  5:57 AM  Result Value Ref Range   Sodium 138 135 - 145 mmol/L   Potassium 4.6 3.5 - 5.1 mmol/L   Chloride 102 98 -  111 mmol/L   CO2 23 22 - 32 mmol/L   Glucose, Bld 81 70 - 99 mg/dL    Comment: Glucose reference range applies only to samples taken after fasting for at least 8 hours.   BUN 45 (H) 8 - 23 mg/dL   Creatinine, Ser 4.36 (H) 0.44 - 1.00 mg/dL    Comment: REPEATED TO VERIFY DELTA CHECK NOTED    Calcium 8.4 (L) 8.9 - 10.3 mg/dL   Total Protein 5.4 (L) 6.5 - 8.1 g/dL   Albumin 2.4 (L)  3.5 - 5.0 g/dL   AST 87 (H) 15 - 41 U/L   ALT 124 (H) 0 - 44 U/L   Alkaline Phosphatase 80 38 - 126 U/L   Total Bilirubin 0.4 0.3 - 1.2 mg/dL   GFR, Estimated 10 (L) >60 mL/min    Comment: (NOTE) Calculated using the CKD-EPI Creatinine Equation (2021)    Anion gap 13 5 - 15    Comment: Performed at [facility], [place] [address], [place], [place] [address]  Heparin level (unfractionated)     Status: None   Collection Time: 01/12/21  5:57 AM  Result Value Ref Range   Heparin Unfractionated 0.65 0.30 - 0.70 IU/mL    Comment: (NOTE) The clinical reportable range upper limit is being lowered to >1.10 to align with the FDA approved guidance for the current laboratory assay.  If heparin results are below expected values, and patient dosage has  been confirmed, suggest follow up testing of antithrombin III levels. Performed at [facility], [place] [address], [place], [place] [address]   CBC     Status: Abnormal   Collection Time: 01/12/21  5:57 AM  Result Value Ref Range   WBC 13.3 (H) 4.0 - 10.5 K/uL   RBC 3.92 3.87 - 5.11 MIL/uL   Hemoglobin 10.4 (L) 12.0 - 15.0 g/dL   HCT 32.1 (L) 36.0 - 46.0 %   MCV 81.9 80.0 - 100.0 fL   MCH 26.5 26.0 - 34.0 pg   MCHC 32.4 30.0 - 36.0 g/dL   RDW 13.9 11.5 - 15.5 %   Platelets 178 150 - 400 K/uL   nRBC 0.0 0.0 - 0.2 %    Comment: Performed at [facility], [place] [address], [place], [place] [address]  Magnesium     Status: Abnormal   Collection Time: 01/12/21  5:57 AM  Result Value Ref Range   Magnesium 1.5 (L) 1.7 - 2.4 mg/dL    Comment: Performed at [facility] [address], [place], [place] [address]  Glucose, capillary     Status: Abnormal   Collection Time: 01/12/21  7:54 AM  Result Value Ref Range   Glucose-Capillary 65 (L) 70 - 99 mg/dL    Comment: Glucose reference range applies only to samples taken after fasting for at least 8 hours.  Glucose, capillary     Status: None   Collection Time: 01/12/21  8:26  AM  Result Value Ref Range   Glucose-Capillary 78 70 - 99 mg/dL    Comment: Glucose reference range applies only to samples taken after fasting for at least 8 hours.  Hemoglobin A1c     Status: Abnormal   Collection Time: 01/12/21  9:30 AM  Result Value Ref Range   Hgb A1c MFr Bld 7.6 (H) 4.8 - 5.6 %    Comment: (NOTE) Pre diabetes:          5.7%-6.4%  Diabetes:              >  6.4%  Glycemic control for   <7.0% adults with diabetes    Mean Plasma Glucose 171.42 mg/dL    Comment: Performed at [facility] [address], [place], [place] [address]  Glucose, capillary     Status: Abnormal   Collection Time: 01/12/21 11:20 AM  Result Value Ref Range   Glucose-Capillary 67 (L) 70 - 99 mg/dL    Comment: Glucose reference range applies only to samples taken after fasting for at least 8 hours.  Glucose, capillary     Status: None   Collection Time: 01/12/21 11:47 AM  Result Value Ref Range   Glucose-Capillary 74 70 - 99 mg/dL    Comment: Glucose reference range applies only to samples taken after fasting for at least 8 hours.  Heparin level (unfractionated)     Status: Abnormal   Collection Time: 01/12/21  1:21 PM  Result Value Ref Range   Heparin Unfractionated 0.78 (H) 0.30 - 0.70 IU/mL    Comment: (NOTE) The clinical reportable range upper limit is being lowered to >1.10 to align with the FDA approved guidance for the current laboratory assay.  If heparin results are below expected values, and patient dosage has  been confirmed, suggest follow up testing of antithrombin III levels. Performed at [facility], [place] [address], [place], [place] [address]   Glucose, capillary     Status: None   Collection Time: 01/12/21  3:41 PM  Result Value Ref Range   Glucose-Capillary 84 70 - 99 mg/dL    Comment: Glucose reference range applies only to samples taken after fasting for at least 8 hours.  Glucose, capillary     Status: None   Collection Time: 01/12/21  7:44 PM   Result Value Ref Range   Glucose-Capillary 99 70 - 99 mg/dL    Comment: Glucose reference range applies only to samples taken after fasting for at least 8 hours.  Heparin level (unfractionated)     Status: None   Collection Time: 01/12/21 10:28 PM  Result Value Ref Range   Heparin Unfractionated 0.31 0.30 - 0.70 IU/mL    Comment: (NOTE) The clinical reportable range upper limit is being lowered to >1.10 to align with the FDA approved guidance for the current laboratory assay.  If heparin results are below expected values, and patient dosage has  been confirmed, suggest follow up testing of antithrombin III levels. Performed at [facility], [place] [address] [place], [place] [address]   Glucose, capillary     Status: Abnormal   Collection Time: 01/12/21 11:06 PM  Result Value Ref Range   Glucose-Capillary 119 (H) 70 - 99 mg/dL    Comment: Glucose reference range applies only to samples taken after fasting for at least 8 hours.  Glucose, capillary     Status: Abnormal   Collection Time: 01/13/21  3:28 AM  Result Value Ref Range   Glucose-Capillary 136 (H) 70 - 99 mg/dL    Comment: Glucose reference range applies only to samples taken after fasting for at least 8 hours.  CBC     Status: Abnormal   Collection Time: 01/13/21  5:22 AM  Result Value Ref Range   WBC 11.9 (H) 4.0 - 10.5 K/uL   RBC 3.36 (L) 3.87 - 5.11 MIL/uL   Hemoglobin 9.1 (L) 12.0 - 15.0 g/dL   HCT 28.4 (L) 36.0 - 46.0 %   MCV 84.5 80.0 - 100.0 fL   MCH 27.1 26.0 - 34.0 pg  MCHC 32.0 30.0 - 36.0 g/dL   RDW 14.4 11.5 - 15.5 %   Platelets 167 150 - 400 K/uL   nRBC 0.0 0.0 - 0.2 %    Comment: Performed at [facility], [place] [address], [place], [place] [address]  Basic metabolic panel     Status: Abnormal   Collection Time: 01/13/21  5:22 AM  Result Value Ref Range   Sodium 134 (L) 135 - 145 mmol/L   Potassium 4.9 3.5 - 5.1 mmol/L   Chloride 103 98 - 111 mmol/L   CO2 19 (L) 22 - 32 mmol/L    Glucose, Bld 176 (H) 70 - 99 mg/dL    Comment: Glucose reference range applies only to samples taken after fasting for at least 8 hours.   BUN 49 (H) 8 - 23 mg/dL   Creatinine, Ser 5.43 (H) 0.44 - 1.00 mg/dL   Calcium 7.8 (L) 8.9 - 10.3 mg/dL   GFR, Estimated 8 (L) >60 mL/min    Comment: (NOTE) Calculated using the CKD-EPI Creatinine Equation (2021)    Anion gap 12 5 - 15    Comment: Performed at [facility] [address], [place], [place] [address]  Glucose, capillary     Status: Abnormal   Collection Time: 01/13/21  7:21 AM  Result Value Ref Range   Glucose-Capillary 189 (H) 70 - 99 mg/dL    Comment: Glucose reference range applies only to samples taken after fasting for at least 8 hours.  Heparin level (unfractionated)     Status: Abnormal   Collection Time: 01/13/21  8:11 AM  Result Value Ref Range   Heparin Unfractionated 0.28 (L) 0.30 - 0.70 IU/mL    Comment: (NOTE) The clinical reportable range upper limit is being lowered to >1.10 to align with the FDA approved guidance for the current laboratory assay.  If heparin results are below expected values, and patient dosage has  been confirmed, suggest follow up testing of antithrombin III levels. Performed at [facility], [place] [address] [place], [place] [address]   Glucose, capillary     Status: Abnormal   Collection Time: 01/13/21 11:06 AM  Result Value Ref Range   Glucose-Capillary 209 (H) 70 - 99 mg/dL    Comment: Glucose reference range applies only to samples taken after fasting for at least 8 hours.     ROS:  Pertinent items noted in HPI and remainder of comprehensive ROS otherwise negative.  Physical Exam: Vitals:   01/13/21 1107 01/13/21 1125  BP:    Pulse:    Resp:    Temp: 98.3 F (36.8 C)   SpO2:  99%     General exam: Sitting on chair comfortable, not in distress Respiratory system: Coarse breath sound bilateral, no increased work of breathing Cardiovascular system: S1 & S2 heard,  RRR.  Gastrointestinal system: Abdomen is nondistended, soft and nontender. Normal bowel sounds heard. Central nervous system: Alert and oriented. No focal neurological deficits. Extremities: Upper extremity edema, only trace lower extreme edema.  No cyanosis or clubbing. Skin: No rashes, lesions or ulcers Psychiatry: Judgement and insight appear normal. Mood & affect appropriate.   Assessment/Plan:  #Acute kidney injury on CKD stage IIIb likely ischemic ATN due to cardiac arrest/shock, IV contrast concomitant with the use of ACE inhibitor: Baseline creatinine level around 1.6 with proteinuria thought to be due to diabetic nephropathy, followed by Dr. Theador Hawthorne.  Extensive serologies and outpatient work up unremarkable. I will check urinalysis, spot urine  PCR, repeat kidney ultrasound. Hold further IV fluid.  Strict ins and outs, daily lab and monitor for renal recovery.  No uremic symptoms and no need for dialysis.  #Anion gap metabolic acidosis: Increase sodium bicarbonate to 2 pills twice a day.  #Acute hypoxic/hypercapnic respiratory failure in the setting of acute PE with acute cor pulmonal: Now extubated, Doppler negative for lower extremity DVT.  On IV heparin and anticoagulation per pulmonary team.  #Cardiac arrest/cardiogenic shock receiving CPR and ROSC in 30 minutes.  In the setting of acute cor pulmonale.  Echo reviewed.  Some chest pain status post CPR.  Thank you for the consult.  We will follow with you.  Cayden Granholm Tanna Furry 01/13/2021, 12:51 PM  [facility].

## 2021-01-13 NOTE — Progress Notes (Signed)
ANTICOAGULATION CONSULT NOTE - Follow Up Consult  Pharmacy Consult for IV Heparin Indication: pulmonary embolus  No Known Allergies  Patient Measurements: Height: 5\' 6"  (167.6 cm) Weight: 105.6 kg (232 lb 12.9 oz) IBW/kg (Calculated) : 59.3 Heparin Dosing Weight: 81.6 kg  Vital Signs: Temp: 97.9 F (36.6 C) (09/06 0722) Temp Source: Oral (09/06 0722) BP: 150/68 (09/06 0900) Pulse Rate: 110 (09/06 0900)  Labs: Recent Labs    01/10/21 2316 01/11/21 0120 01/11/21 0655 01/11/21 0700 01/11/21 0842 01/11/21 1300 01/11/21 1800 01/12/21 0341 01/12/21 0557 01/12/21 1321 01/12/21 2228 01/13/21 0522 01/13/21 0811  HGB 11.3* 11.3* 11.3*   < >  --   --   --  10.5* 10.4*  --   --  9.1*  --   HCT 36.6 36.1 36.4   < >  --   --   --  31.0* 32.1*  --   --  28.4*  --   PLT 219 232 224  --   --   --   --   --  178  --   --  167  --   APTT  --   --  114*  --  79*  --   --   --   --   --   --   --   --   LABPROT  --   --  >90.0*  --   --   --   --   --   --   --   --   --   --   INR  --   --  >10.0*  --   --   --   --   --   --   --   --   --   --   HEPARINUNFRC  --   --   --   --   --   --    < >  --  0.65 0.78* 0.31  --  0.28*  CREATININE 1.90*  --  2.31*  --   --  2.86*  --   --  4.36*  --   --  5.43*  --   TROPONINIHS 19* 77* 390*  --   --   --   --   --   --   --   --   --   --    < > = values in this interval not displayed.     Estimated Creatinine Clearance: 11.7 mL/min (A) (by C-G formula based on SCr of 5.43 mg/dL (H)).   Assessment: 70 years of age female who arrested at [facility] with confirmed PE and received alteplase 100mg   at 03:00 AM. Patient has had two aPTTs, last down to 79. CBC within normal limits. Patient with nausea and vomiting - sent for stat CT Head which was reviewed by Dr. Halford Chessman. Dr. Halford Chessman gave verbal order to proceed with IV heparin therapy. Now >24 hours since [place], ok for goal 0.3 to 0.7.   Heparin level low at 0.28, decreased after rate increase  earlier this AM. Hg down to 9.1, plt wnl. No bleeding or issues with infusion per discussion with RN. Noted AKI - continues to worsen with SCr up to 5.43 -- at risk for accumulation. Will monitor levels closely.  Goal of Therapy:  Heparin level 0.3-0.7 units/ml (>24hrs post Alteplase) Monitor platelets by anticoagulation protocol: Yes   Plan:  Increase IV Heparin to 1350 units/hr.  Check 6hr heparin level Monitor daily  CBC, s/sx bleeding   Arturo Morton, PharmD, BCPS Please check AMION for all [facility] contact numbers Clinical Pharmacist 01/13/2021 9:39 AM

## 2021-01-13 NOTE — Progress Notes (Signed)
Son, Elta Guadeloupe, updated via phone on plan of care.     Noe Gens, MSN, APRN, NP-C, AGACNP-BC  Pulmonary & Critical Care 01/13/2021, 10:56 AM   Please see [email or web address] for pager details.   From 7A-7P if no response, please call [phone] After hours, please call ELink [phone]

## 2021-01-13 NOTE — Progress Notes (Addendum)
NAME:  Kathleen [name], MRN:  [id number], DOB:  04-08-1951, LOS: 2 ADMISSION DATE:  01/10/2021, CONSULTATION DATE:  9/4 REFERRING MD:  Rancour MD , CHIEF COMPLAINT:  Dizziness and SOB   History of Present Illness:  70 y/o female with hx of PE and was transitioned off eliquis in July 2022 presented to [facility] with dyspnea and dizziness.  Found to have recurrent PE on CT angiogram chest with RV strain.  Developed cardiac arrest with ROSC after about 30 minutes.  She received thrombolytic therapy.  Intubated and started on pressors.  Transferred to [facility] for further management.  Pertinent  Medical History  Unprovoked PE (June 2021), HTN, CKD, HLD, GERD, thyroid nodule, DM type II  Significant Hospital Events: Including procedures, antibiotic start and stop dates in addition to other pertinent events   9/3 presented 9/4 Asystole arrest, TPA 100mg , intubated 9/5 off pressors, extubated  Studies:  CT angio chest 9/04 >> b/l PE, RV:LV ratio 2.7 CT head 9/04 >> mild white matter disease Doppler legs 9/04 >> no evidence of DVT Echo 9/04 >> EF 55 to 60%, mod LVH, grade 1 DD, positive McConnell's sign, mod RV systolic dysfunction, moderate elevation in PASP, mod/severe TR  Interim History / Subjective:  Afebrile  On 2L [place] Glucose range 119-189 I/O 866ml UOP, +642ml in last 24 hours On heparin infusion   Objective   Blood pressure 112/60, pulse (!) 118, temperature 97.9 F (36.6 C), temperature source Oral, resp. rate (!) 27, height 5\' 6"  (1.676 m), weight 105.6 kg, SpO2 99 %.    FiO2 (%):  [40 %] 40 %   Intake/Output Summary (Last 24 hours) at 01/13/2021 0742 Last data filed at 01/13/2021 0600 Gross per 24 hour  Intake 1479.42 ml  Output 850 ml  Net 629.42 ml   Filed Weights   01/10/21 2300 01/12/21 0549 01/13/21 0335  Weight: 98.9 kg 105 kg 105.6 kg    Examination: General: adult female sitting up in bed in NAD   HEENT: MM pink/moist, [place] O2, anicteric  Neuro: AAOx4, speech clear,  MAE CV: s1s2 RRR, ST on monitor, no m/r/g PULM: non-labored on [place], lungs bilaterally clear  GI: soft, bsx4 active  Extremities: warm/dry, bilateral UE1+, LE trace to 1+ edema  Skin: no rashes or lesions  Resolved Hospital Problem list   Cardiogenic shock, Asystolic cardiac arrest, Acute hypoxic encephalopathy  Assessment & Plan:   Acute hypoxic/hypercapnic respiratory failure in setting of acute pulmonary embolism with acute cor pulmonale. Hx of pulmonary embolism in 2021. Post cardiac arrest with intubation.  Extubated 9/5. LE negative for DVT.  -wean O2 for sats >92% -continue heparin gtt per pharmacy  -consider minimum 48 hours therapeutic on infusion before transition to [place] > plan for 9/7 transition -she will need life long anticoagulation   Acute cor pulmonale from acute PE. Hx of HTN. -ASA, norvasc -hold home catapres, lisinopril, spironolactone, pravachol   AKI with ATN from cardiogenic shock. -No acute indications for HD at this time, defer Nephrology consult for now  -add sodium bicarbonate 650mg  PO TID x3 days -encourage PO intake  -Trend BMP / urinary output -Replace electrolytes as indicated -Avoid nephrotoxic agents, ensure adequate renal perfusion  Chest Pain / Discomfort Post CPR  Prolonged CPR, approx 30 minutes  -add scheduled tylenol 1gm BID x2 days  -avoid sedating medications  -warm compress to site   DM type 2. DM neuropathy. -SSI -hold outpatient Neurontin   Hx of GERD -protonix   Hx thyroid  nodule. -will need outpatient follow up for biopsy  Best Practice (right click and "Reselect all SmartList Selections" daily)  Diet/type: NPO DVT prophylaxis: systemic heparin GI prophylaxis: PPI Lines: N/A Foley:  N/A Code Status:  full code Last date of multidisciplinary goals of care discussion: patient updated on plan of care 9/6.  Consider transition of of ICU this afternoon. Attempted to call son Vesta Mixer, no answer.  Message left for return  call. Called [place] as well. No answer.   Critical care time: n/a   Noe Gens, MSN, APRN, NP-C, AGACNP-BC [place] [facility] 01/13/2021, 7:42 AM   Please see [email or web address] for pager details.   From 7A-7P if no response, please call [phone] After hours, please call ELink [phone]

## 2021-01-13 NOTE — Assessment & Plan Note (Signed)
Biopsy recommended in 2021, referral on asap basis made and I explained the importance of follow through, will also have endo follow up

## 2021-01-13 NOTE — Progress Notes (Signed)
ANTICOAGULATION CONSULT NOTE - Follow Up Consult  Pharmacy Consult for IV Heparin Indication: pulmonary embolus  No Known Allergies  Patient Measurements: Height: 5\' 6"  (167.6 cm) Weight: 105.6 kg (232 lb 12.9 oz) IBW/kg (Calculated) : 59.3 Heparin Dosing Weight: 81.6 kg  Vital Signs: Temp: 97.4 F (36.3 C) (09/06 1536) Temp Source: Oral (09/06 1536) BP: 152/55 (09/06 1800) Pulse Rate: 111 (09/06 1800)  Labs: Recent Labs    01/10/21 2316 01/11/21 0120 01/11/21 0655 01/11/21 0700 01/11/21 0842 01/11/21 1300 01/11/21 1800 01/12/21 0341 01/12/21 0557 01/12/21 1321 01/12/21 2228 01/13/21 0522 01/13/21 0811 01/13/21 1649  HGB 11.3* 11.3* 11.3*   < >  --   --   --  10.5* 10.4*  --   --  9.1*  --   --   HCT 36.6 36.1 36.4   < >  --   --   --  31.0* 32.1*  --   --  28.4*  --   --   PLT 219 232 224  --   --   --   --   --  178  --   --  167  --   --   APTT  --   --  114*  --  79*  --   --   --   --   --   --   --   --   --   LABPROT  --   --  >90.0*  --   --   --   --   --   --   --   --   --   --   --   INR  --   --  >10.0*  --   --   --   --   --   --   --   --   --   --   --   HEPARINUNFRC  --   --   --   --   --   --    < >  --  0.65   < > 0.31  --  0.28* 0.40  CREATININE 1.90*  --  2.31*  --   --  2.86*  --   --  4.36*  --   --  5.43*  --   --   TROPONINIHS 19* 77* 390*  --   --   --   --   --   --   --   --   --   --   --    < > = values in this interval not displayed.     Estimated Creatinine Clearance: 11.7 mL/min (A) (by C-G formula based on SCr of 5.43 mg/dL (H)).   Assessment: 70 years of age female who arrested at [facility] with confirmed PE and received alteplase 100mg   at 03:00 AM. Patient has had two aPTTs, last down to 79. CBC within normal limits. Patient with nausea and vomiting - sent for stat CT Head which was reviewed by Dr. Halford Chessman. Dr. Halford Chessman gave verbal order to proceed with IV heparin therapy. Now >24 hours since [place], ok for goal 0.3 to 0.7.    Heparin level therapeutic at 0.4. No bleeding or issues noted. Noted AKI - continues to worsen with SCr up to 5.43 -- at risk for accumulation. Will monitor levels closely.  Goal of Therapy:  Heparin level 0.3-0.7 units/ml (>24hrs post Alteplase) Monitor platelets by anticoagulation protocol: Yes   Plan:  Continue  IV Heparin at 1350 units/hr.  Check heparin level with am labs Monitor daily CBC, s/sx bleeding Alanda Slim, PharmD, [facility] Clinical Pharmacist Please see AMION for all Pharmacists' Contact Phone Numbers 01/13/2021, 6:41 PM

## 2021-01-13 NOTE — Progress Notes (Signed)
Assisted tele visit to patient with family member.  Thomas, Kathleen [name] Renee, RN   

## 2021-01-13 NOTE — Progress Notes (Signed)
ANTICOAGULATION CONSULT NOTE - Follow Up Consult  Pharmacy Consult for heparin Indication: pulmonary embolus  Labs: Recent Labs    01/10/21 2316 01/11/21 0120 01/11/21 0655 01/11/21 0700 01/11/21 0842 01/11/21 1300 01/11/21 1800 01/12/21 0341 01/12/21 0557 01/12/21 1321 01/12/21 2228  HGB 11.3* 11.3* 11.3* 12.2  --   --   --  10.5* 10.4*  --   --   HCT 36.6 36.1 36.4 36.0  --   --   --  31.0* 32.1*  --   --   PLT 219 232 224  --   --   --   --   --  178  --   --   APTT  --   --  114*  --  79*  --   --   --   --   --   --   LABPROT  --   --  >90.0*  --   --   --   --   --   --   --   --   INR  --   --  >10.0*  --   --   --   --   --   --   --   --   HEPARINUNFRC  --   --   --   --   --   --    < >  --  0.65 0.78* 0.31  CREATININE 1.90*  --  2.31*  --   --  2.86*  --   --  4.36*  --   --   TROPONINIHS 19* 77* 390*  --   --   --   --   --   --   --   --    < > = values in this interval not displayed.    Assessment: 70yo female therapeutic on heparin after rate change but now at very low end of goal and would prefer higher w/ massive PE; no infusion issues or signs of bleeding per RN.  Goal of Therapy:  Heparin level 0.3-0.7 units/ml   Plan:  Will increase heparin infusion by 1 unit/kg/hr to 1200 units/hr and check level in 8 hours.    Wynona Neat, PharmD, BCPS  01/13/2021,12:24 AM

## 2021-01-13 NOTE — Plan of Care (Signed)
Patient got up to chair this shift for lunch and sat up for approximately 2hrs and tolerated well.  Did get a little short of breathe while moving back to bed.  Remains on [facility].  I&O cath for a specimen this afternoon with 627ml out.  Tylenol given for her chest soreness.  Does have noted transfer orders for Cardiac Telemetry.  Family in to visit this shift and also video chatted.   Problem: Education: Goal: Knowledge of General Education information will improve Description: Including pain rating scale, medication(s)/side effects and non-pharmacologic comfort measures Outcome: Progressing   Problem: Health Behavior/Discharge Planning: Goal: Ability to manage health-related needs will improve Outcome: Progressing   Problem: Clinical Measurements: Goal: Ability to maintain clinical measurements within normal limits will improve Outcome: Progressing Goal: Will remain free from infection Outcome: Progressing Goal: Diagnostic test results will improve Outcome: Progressing Goal: Cardiovascular complication will be avoided Outcome: Progressing   Problem: Nutrition: Goal: Adequate nutrition will be maintained Outcome: Progressing   Problem: Coping: Goal: Level of anxiety will decrease Outcome: Progressing   Problem: Elimination: Goal: Will not experience complications related to bowel motility Outcome: Progressing   Problem: Pain Managment: Goal: General experience of comfort will improve Outcome: Progressing   Problem: Safety: Goal: Ability to remain free from injury will improve Outcome: Progressing   Problem: Skin Integrity: Goal: Risk for impaired skin integrity will decrease Outcome: Progressing   Problem: Activity: Goal: Ability to tolerate increased activity will improve Outcome: Progressing   Problem: Respiratory: Goal: Ability to maintain a clear airway and adequate ventilation will improve Outcome: Progressing   Problem: Role Relationship: Goal: Method of  communication will improve Outcome: Progressing   Problem: Education: Goal: Ability to describe self-care measures that may prevent or decrease complications (Diabetes Survival Skills Education) will improve Outcome: Progressing Goal: Individualized Educational Video(s) Outcome: Progressing   Problem: Coping: Goal: Ability to adjust to condition or change in health will improve Outcome: Progressing   Problem: Fluid Volume: Goal: Ability to maintain a balanced intake and output will improve Outcome: Progressing   Problem: Health Behavior/Discharge Planning: Goal: Ability to identify and utilize available resources and services will improve Outcome: Progressing Goal: Ability to manage health-related needs will improve Outcome: Progressing   Problem: Metabolic: Goal: Ability to maintain appropriate glucose levels will improve Outcome: Progressing   Problem: Nutritional: Goal: Maintenance of adequate nutrition will improve Outcome: Progressing Goal: Progress toward achieving an optimal weight will improve Outcome: Progressing   Problem: Skin Integrity: Goal: Risk for impaired skin integrity will decrease Outcome: Progressing   Problem: Tissue Perfusion: Goal: Adequacy of tissue perfusion will improve Outcome: Progressing   Problem: Clinical Measurements: Goal: Respiratory complications will improve Outcome: Not Progressing Note: Continues to feel SOB with activity 2LNC in place.    Problem: Activity: Goal: Risk for activity intolerance will decrease Outcome: Not Progressing   Problem: Elimination: Goal: Will not experience complications related to urinary retention Outcome: Not Progressing Note: I&O cath x1 this shift for specimen drained 646ml at that time

## 2021-01-13 NOTE — Progress Notes (Signed)
Patient transferred to [facility] for 9/7 am, PCCM will be available PRN. Please call back if new needs arise.    Noe Gens, MSN, APRN, NP-C, AGACNP-BC [place] [facility] 01/13/2021, 2:31 PM   Please see [email or web address] for pager details.   From 7A-7P if no response, please call [phone] After hours, please call ELink [phone]

## 2021-01-13 NOTE — Assessment & Plan Note (Signed)
Kathleen [name] is reminded of the importance of commitment to daily physical activity for 30 minutes or more, as able and the need to limit carbohydrate intake to 30 to 60 grams per meal to help with blood sugar control.   The need to take medication as prescribed, test blood sugar as directed, and to call between visits if there is a concern that blood sugar is uncontrolled is also discussed.   Kathleen [name] is reminded of the importance of daily foot exam, annual eye examination, and good blood sugar, blood pressure and cholesterol control. Improved but still not at goal Updated lab needed at/ before next visit.   Diabetic Labs Latest Ref Rng & Units 01/13/2021 01/12/2021 01/11/2021 01/11/2021 01/11/2021  HbA1c 4.8 - 5.6 % - 7.6(H) - - 7.8(H)  Microalbumin mg/dL - - - - -  Micro/Creat Ratio 0 - 29 mg/g creat - - - - -  Chol 100 - 199 mg/dL - - - - -  HDL >39 mg/dL - - - - -  Calc LDL 0 - 99 mg/dL - - - - -  Triglycerides 0 - 149 mg/dL - - - - -  Creatinine 0.44 - 1.00 mg/dL 5.43(H) 4.36(H) 2.86(H) 2.31(H) -   BP/Weight 01/13/2021 01/08/2021 01/02/2021 12/26/2020 11/17/2020 4/0/9811 01/08/4781  Systolic BP 956 213 086 578 469 629 528  Diastolic BP 68 66 54 78 78 77 79  Wt. (Lbs) 232.81 218 219 218.6 220 216 216  BMI 37.58 35.19 35.35 35.28 35.51 34.86 34.86   Foot/eye exam completion dates Latest Ref Rng & Units 07/15/2020 03/19/2020  Eye Exam No Retinopathy No Retinopathy -  Foot Form Completion - - Done

## 2021-01-13 NOTE — Assessment & Plan Note (Signed)
Controlled, no change in medication  

## 2021-01-14 ENCOUNTER — Inpatient Hospital Stay (HOSPITAL_COMMUNITY): Payer: Medicare Other

## 2021-01-14 DIAGNOSIS — I469 Cardiac arrest, cause unspecified: Secondary | ICD-10-CM

## 2021-01-14 DIAGNOSIS — I2699 Other pulmonary embolism without acute cor pulmonale: Secondary | ICD-10-CM | POA: Diagnosis not present

## 2021-01-14 DIAGNOSIS — N179 Acute kidney failure, unspecified: Secondary | ICD-10-CM | POA: Diagnosis not present

## 2021-01-14 DIAGNOSIS — R579 Shock, unspecified: Secondary | ICD-10-CM | POA: Diagnosis not present

## 2021-01-14 LAB — GLUCOSE, CAPILLARY
Glucose-Capillary: 171 mg/dL — ABNORMAL HIGH (ref 70–99)
Glucose-Capillary: 130 mg/dL — ABNORMAL HIGH (ref 70–99)
Glucose-Capillary: 116 mg/dL — ABNORMAL HIGH (ref 70–99)
Glucose-Capillary: 152 mg/dL — ABNORMAL HIGH (ref 70–99)

## 2021-01-14 LAB — CBC
HCT: 25.6 % — ABNORMAL LOW (ref 36.0–46.0)
Hemoglobin: 8.1 g/dL — ABNORMAL LOW (ref 12.0–15.0)
MCH: 26.8 pg (ref 26.0–34.0)
MCHC: 31.6 g/dL (ref 30.0–36.0)
MCV: 84.8 fL (ref 80.0–100.0)
Platelets: 171 10*3/uL (ref 150–400)
RBC: 3.02 MIL/uL — ABNORMAL LOW (ref 3.87–5.11)
RDW: 14.6 % (ref 11.5–15.5)
WBC: 12.4 10*3/uL — ABNORMAL HIGH (ref 4.0–10.5)
nRBC: 0 % (ref 0.0–0.2)

## 2021-01-14 LAB — BASIC METABOLIC PANEL
Anion gap: 13 (ref 5–15)
BUN: 52 mg/dL — ABNORMAL HIGH (ref 8–23)
CO2: 17 mmol/L — ABNORMAL LOW (ref 22–32)
Calcium: 8.3 mg/dL — ABNORMAL LOW (ref 8.9–10.3)
Chloride: 103 mmol/L (ref 98–111)
Creatinine, Ser: 5.71 mg/dL — ABNORMAL HIGH (ref 0.44–1.00)
GFR, Estimated: 7 mL/min — ABNORMAL LOW (ref >60)
Glucose, Bld: 171 mg/dL — ABNORMAL HIGH (ref 70–99)
Potassium: 5.5 mmol/L — ABNORMAL HIGH (ref 3.5–5.1)
Sodium: 133 mmol/L — ABNORMAL LOW (ref 135–145)

## 2021-01-14 LAB — HEPARIN LEVEL (UNFRACTIONATED)
Heparin Unfractionated: 0.25 IU/mL — ABNORMAL LOW (ref 0.30–0.70)
Heparin Unfractionated: 0.64 IU/mL (ref 0.30–0.70)
Heparin Unfractionated: 0.62 IU/mL (ref 0.30–0.70)

## 2021-01-14 MED ORDER — ALBUTEROL SULFATE (2.5 MG/3ML) 0.083% IN NEBU: 2.5000 mg | INHALATION_SOLUTION | RESPIRATORY_TRACT | Status: DC | PRN

## 2021-01-14 MED ORDER — OXYCODONE HCL 5 MG PO TABS
5.0000 mg | ORAL_TABLET | Freq: Four times a day (QID) | ORAL | Status: AC | PRN
  Administered 2021-01-14 – 2021-01-15 (×4): 5 mg via ORAL
  Filled 2021-01-14 (×4): qty 1

## 2021-01-14 MED ORDER — METHYLPREDNISOLONE SODIUM SUCC 40 MG IJ SOLR
40.0000 mg | Freq: Two times a day (BID) | INTRAMUSCULAR | Status: DC
Start: 2021-01-14 — End: 2021-01-15
  Administered 2021-01-14 – 2021-01-15 (×2): 40 mg via INTRAVENOUS
  Filled 2021-01-14 (×2): qty 1

## 2021-01-14 MED ORDER — SPIRONOLACTONE-HCTZ 25-25 MG PO TABS: 1.0000 | ORAL_TABLET | Freq: Every day | ORAL | Status: DC

## 2021-01-14 MED ORDER — HYDROMORPHONE HCL 1 MG/ML IJ SOLN
0.5000 mg | Freq: Once | INTRAMUSCULAR | Status: AC
Start: 2021-01-14 — End: 2021-01-14
  Administered 2021-01-14: 0.5 mg via INTRAVENOUS
  Filled 2021-01-14: qty 1

## 2021-01-14 MED ORDER — FUROSEMIDE 10 MG/ML IJ SOLN
40.0000 mg | Freq: Once | INTRAMUSCULAR | Status: AC
  Administered 2021-01-14: 40 mg via INTRAVENOUS
  Filled 2021-01-14: qty 4

## 2021-01-14 MED ORDER — SODIUM ZIRCONIUM CYCLOSILICATE 10 G PO PACK
10.0000 g | PACK | Freq: Once | ORAL | Status: AC
  Administered 2021-01-14: 10 g via ORAL
  Filled 2021-01-14: qty 1

## 2021-01-14 MED FILL — Medication: Qty: 1 | Status: AC

## 2021-01-14 NOTE — Progress Notes (Signed)
ANTICOAGULATION CONSULT NOTE - Follow Up Consult  Pharmacy Consult for IV Heparin Indication: pulmonary embolus  No Known Allergies  Patient Measurements: Height: 5\' 6"  (167.6 cm) Weight: 104.7 kg (230 lb 13.2 oz) IBW/kg (Calculated) : 59.3 Heparin Dosing Weight: 81.6 kg  Vital Signs: Temp: 97.7 F (36.5 C) (09/07 0740) Temp Source: Oral (09/07 0740) BP: 141/78 (09/07 0740) Pulse Rate: 103 (09/07 0740)  Labs: Recent Labs    01/12/21 0557 01/12/21 1321 01/13/21 0522 01/13/21 0811 01/13/21 1649 01/14/21 0113 01/14/21 0538  HGB 10.4*  --  9.1*  --   --   --  8.1*  HCT 32.1*  --  28.4*  --   --   --  25.6*  PLT 178  --  167  --   --   --  171  HEPARINUNFRC 0.65   < >  --  0.28* 0.40 0.25*  --   CREATININE 4.36*  --  5.43*  --   --  5.71*  --    < > = values in this interval not displayed.     Estimated Creatinine Clearance: 11.1 mL/min (A) (by C-G formula based on SCr of 5.71 mg/dL (H)).   Assessment: 70 years of age female who arrested at [facility] with confirmed PE and received alteplase 100mg  on 9/04 @ 0300. Now >24 hours since Alteplase, continue heparin IV with goal heparin level between 0.3 to 0.7.   No bleeding or issues noted. Noted AKI - continues to worsen with SCr up to 5.71 -- at risk for accumulation. Will monitor levels closely. Heparin level 9/07 @ 1053 at 0.64. Will decrease slightly to avoid becoming supratherapeutic.  Goal of Therapy:  Heparin level 0.3-0.7 units/ml (>24hrs post Alteplase) Monitor platelets by anticoagulation protocol: Yes   Plan:  - Decrease IV Heparin to 1450 units/hr.  - Check heparin level in 8 hours and with AM labs - Monitor daily CBC, s/sx bleeding   Thank you for allowing pharmacy to be a part of this patient's care.  Ardyth Harps, PharmD Clinical Pharmacist

## 2021-01-14 NOTE — Progress Notes (Signed)
ANTICOAGULATION CONSULT NOTE - Follow Up Consult  Pharmacy Consult for IV Heparin Indication: pulmonary embolus  No Known Allergies  Patient Measurements: Height: 5\' 6"  (167.6 cm) Weight: 104.7 kg (230 lb 13.2 oz) IBW/kg (Calculated) : 59.3 Heparin Dosing Weight: 81.6 kg  Vital Signs: Temp: 98.7 F (37.1 C) (09/07 1950) Temp Source: Oral (09/07 1950) BP: 143/64 (09/07 1950) Pulse Rate: 100 (09/07 1950)  Labs: Recent Labs    01/12/21 0557 01/12/21 1321 01/13/21 0522 01/13/21 0811 01/14/21 0113 01/14/21 0538 01/14/21 1043 01/14/21 2036  HGB 10.4*  --  9.1*  --   --  8.1*  --   --   HCT 32.1*  --  28.4*  --   --  25.6*  --   --   PLT 178  --  167  --   --  171  --   --   HEPARINUNFRC 0.65   < >  --    < > 0.25*  --  0.64 0.62  CREATININE 4.36*  --  5.43*  --  5.71*  --   --   --    < > = values in this interval not displayed.     Estimated Creatinine Clearance: 11.1 mL/min (A) (by C-G formula based on SCr of 5.71 mg/dL (H)).   Assessment: 70 years of age female who arrested at [facility] with confirmed PE and received alteplase 100mg  on 9/04 @ 0300. Now >24 hours since Alteplase, continue heparin IV with goal heparin level between 0.3 to 0.7.   No bleeding or issues noted. Noted AKI - continues to worsen with SCr up to 5.71 -- at risk for accumulation.  Heparin level this evening remains in goal range.  No overt bleeding or complications noted.  Goal of Therapy:  Heparin level 0.3-0.7 units/ml (>24hrs post Alteplase) Monitor platelets by anticoagulation protocol: Yes   Plan:  Continue IV heparin at current rate. Daily heparin level and CBC.  [place] Crane, Roylene Reason, BCCP Clinical Pharmacist  01/14/2021 9:18 PM   [facility] pharmacy phone numbers are listed on [email or web address]

## 2021-01-14 NOTE — Progress Notes (Signed)
ANTICOAGULATION CONSULT NOTE - Follow Up Consult  Pharmacy Consult for IV Heparin Indication: pulmonary embolus  No Known Allergies  Patient Measurements: Height: 5\' 6"  (167.6 cm) Weight: 104.7 kg (230 lb 13.2 oz) IBW/kg (Calculated) : 59.3 Heparin Dosing Weight: 81.6 kg  Vital Signs: Temp: 98.2 F (36.8 C) (09/06 2341) Temp Source: Oral (09/06 2341) BP: 146/62 (09/06 2341) Pulse Rate: 105 (09/06 2341)  Labs: Recent Labs    01/11/21 0655 01/11/21 0700 01/11/21 0842 01/11/21 1300 01/11/21 1800 01/12/21 0341 01/12/21 0557 01/12/21 1321 01/13/21 0522 01/13/21 0811 01/13/21 1649 01/14/21 0113  HGB 11.3*   < >  --   --   --  10.5* 10.4*  --  9.1*  --   --   --   HCT 36.4   < >  --   --   --  31.0* 32.1*  --  28.4*  --   --   --   PLT 224  --   --   --   --   --  178  --  167  --   --   --   APTT 114*  --  79*  --   --   --   --   --   --   --   --   --   LABPROT >90.0*  --   --   --   --   --   --   --   --   --   --   --   INR >10.0*  --   --   --   --   --   --   --   --   --   --   --   HEPARINUNFRC  --   --   --   --    < >  --  0.65   < >  --  0.28* 0.40 0.25*  CREATININE 2.31*  --   --  2.86*  --   --  4.36*  --  5.43*  --   --   --   TROPONINIHS 390*  --   --   --   --   --   --   --   --   --   --   --    < > = values in this interval not displayed.     Estimated Creatinine Clearance: 11.6 mL/min (A) (by C-G formula based on SCr of 5.43 mg/dL (H)).   Assessment: 70 years of age female who arrested at [facility] with confirmed PE and received alteplase 100mg   at 03:00 AM. Patient has had two aPTTs, last down to 79. CBC within normal limits. Patient with nausea and vomiting - sent for stat CT Head which was reviewed by Dr. Halford Chessman. Dr. Halford Chessman gave verbal order to proceed with IV heparin therapy. Now >24 hours since Alteplase, ok for goal 0.3 to 0.7.   Heparin level down to subtherapeutic (0.25) on gtt at 1350 units/hr. No issues with line or bleeding reported per  RN.  Goal of Therapy:  Heparin level 0.3-0.7 units/ml (>24hrs post Alteplase) Monitor platelets by anticoagulation protocol: Yes   Plan:  Increase IV Heparin to 1500 units/hr F/u 8 hr heparin level  Sherlon Handing, PharmD, BCPS Please see [email or web address] for complete clinical pharmacist phone list 01/14/2021, 2:44 AM

## 2021-01-14 NOTE — Progress Notes (Addendum)
[facility] NEPHROLOGY PROGRESS NOTE  Assessment/ Plan: Pt is a 70 y.o. yo female with a history of HTN, type 2 DM, HLD, obesity, CKD IIIb, and unprovoked PE in 2021 recently transitioned off eliquis who presented with dyspnea and dizziness, found to have bilateral recurrent PE with right ventricular strain. She had cardiac arrest with ROSC after about 30 min. She received thrombolytic therapy, was intubated and started on pressors. She was seen as a consult for AKI on CKD.    # AKI on CKD stage IIIb- likely ATN in the setting of cardiogenic shock, ACE inhibitor use and IV contrast. Extensive outpatient serology workup has been unrevealing. Renal US unremarkable. Spot urine PCR pending. Creatine has worsened today at 5.71, as expected. I suspect this will flatten and downtrend. No uremic symptoms. Do not anticipate she will need HD. Continue to trend BMP and start IV lasix 40 mg due to SOB.  # Hyperkalemia- K 5.5 today, will give lokelma and trend.  # Anion gap metabolic acidosis- Continue sodium bicarbonate bid.   # Acute hypoxic/hypercapnic respiratory failure- in the setting of PE with acute cor pulmonale. S/p extubation on 9/5. On IV heparin and anticoagulation per pulm. SOB on exam today, ordered IV lasix 40 mg x1.  # Cardiac arrest/cardiogenic shock- s/p CPR and ROSC after 30 min, in the setting of PE and acute cor pulmonale.    Subjective:    Patient reports feeling well this morning. States her chest pain has improved on pain meds. States she is continuing to make urine. No complaints.   Objective Vital signs in last 24 hours: Vitals:   01/13/21 2111 01/13/21 2341 01/14/21 0405 01/14/21 0740  BP: 137/72 (!) 146/62 (!) 160/77 (!) 141/78  Pulse: (!) 110 (!) 105 (!) 108 (!) 103  Resp: 20 20 (!) 22 20  Temp: 98.7 F (37.1 C) 98.2 F (36.8 C) 97.9 F (36.6 C) 97.7 F (36.5 C)  TempSrc: Oral Oral Oral Oral  SpO2: 100% 100% 99% 100%  Weight: 104.7 kg     Height:  5\' 6"  (1.676 m)      Weight change: -0.9 kg  Intake/Output Summary (Last 24 hours) at 01/14/2021 0831 Last data filed at 01/14/2021 0830 Gross per 24 hour  Intake 1252.21 ml  Output 1050 ml  Net 202.21 ml       Labs: Basic Metabolic Panel: Recent Labs  Lab 01/12/21 0557 01/13/21 0522 01/14/21 0113  NA 138 134* 133*  K 4.6 4.9 5.5*  CL 102 103 103  CO2 23 19* 17*  GLUCOSE 81 176* 171*  BUN 45* 49* 52*  CREATININE 4.36* 5.43* 5.71*  CALCIUM 8.4* 7.8* 8.3*   Liver Function Tests: Recent Labs  Lab 01/10/21 2316 01/11/21 0655 01/12/21 0557  AST 22 203* 87*  ALT 26 203* 124*  ALKPHOS 99 114 80  BILITOT 0.4 0.9 0.4  PROT 8.0 5.9* 5.4*  ALBUMIN 4.1 2.7* 2.4*   No results for input(s): LIPASE, AMYLASE in the last 168 hours. No results for input(s): AMMONIA in the last 168 hours. CBC: Recent Labs  Lab 01/10/21 2316 01/11/21 0120 01/11/21 0655 01/11/21 0700 01/12/21 0557 01/13/21 0522 01/14/21 0538  WBC 9.2 9.8 21.8*  --  13.3* 11.9* 12.4*  NEUTROABS 6.3  --   --   --   --   --   --   HGB 11.3* 11.3* 11.3*   < > 10.4* 9.1* 8.1*  HCT 36.6 36.1 36.4   < > 32.1* 28.4*  25.6*  MCV 87.8 88.7 87.1  --  81.9 84.5 84.8  PLT 219 232 224  --  178 167 171   < > = values in this interval not displayed.   Cardiac Enzymes: No results for input(s): CKTOTAL, CKMB, CKMBINDEX, TROPONINI in the last 168 hours. CBG: Recent Labs  Lab 01/13/21 1106 01/13/21 1535 01/13/21 1942 01/13/21 2136 01/14/21 0624  GLUCAP 209* 189* 171* 175* 171*    Iron Studies: No results for input(s): IRON, TIBC, TRANSFERRIN, FERRITIN in the last 72 hours. Studies/Results: US RENAL  Result Date: 01/13/2021 CLINICAL DATA:  Acute kidney injury EXAM: RENAL / URINARY TRACT ULTRASOUND COMPLETE COMPARISON:  Ultrasound 02/14/2020 FINDINGS: Right Kidney: Renal measurements: 10.5 x 6.1 x 6.2 cm = volume: 205.7 mL. Echogenicity within normal limits. Mild cortical thinning. No hydronephrosis. Cyst at the upper  pole measuring 3.9 x 4.4 x 3 cm. Left Kidney: Renal measurements: 9.8 x 5 x 5.4 cm = volume: 151.1 mL. Echogenicity within normal limits. No mass or hydronephrosis Bladder: Appears normal for degree of bladder distention. Other: None. IMPRESSION: 1. Negative for hydronephrosis. 2. Mild renal cortical thinning on the right. Simple right renal cyst Electronically Signed   By: Donavan Foil M.D.   On: 01/13/2021 19:51    Medications: Infusions:  sodium chloride Stopped (01/11/21 0813)   heparin 1,500 Units/hr (01/14/21 1610)    Scheduled Medications:  acetaminophen  1,000 mg Oral BID   amLODipine  10 mg Oral Daily   aspirin EC  81 mg Oral Daily   Chlorhexidine Gluconate Cloth  6 each Topical Daily   cholecalciferol  1,000 Units Oral Daily   insulin aspart  0-20 Units Subcutaneous TID WC   insulin aspart  0-5 Units Subcutaneous QHS   mouth rinse  15 mL Mouth Rinse BID   pantoprazole (PROTONIX) IV  40 mg Intravenous QHS   sodium bicarbonate  1,300 mg Oral BID    have reviewed scheduled and prn medications.  Physical Exam: General:NAD, comfortable Heart:RRR, s1s2 nl Lungs:clear b/l, no crackle Abdomen:soft, Non-tender, non-distended Extremities:No edema Dialysis Access: n/a   Edina Winningham N Cassey Bacigalupo 01/14/2021,8:31 AM  LOS: 3 days

## 2021-01-14 NOTE — Progress Notes (Addendum)
[facility] PROGRESS NOTE    Progress Note  Kathleen [name]  YTK:[id number] DOB: 09/04/1950 DOA: 01/10/2021 PCP: Fayrene Helper, MD     Brief Narrative:   Kathleen [name] is an 70 y.o. female past medical history of essential hypertension, diabetes mellitus type 2, chronic kidney disease stage IIIb, PE who was taken off Eliquis in July 2022 initially presented to [facility] 01/10/2021 with dyspnea and dizziness, was found to have recurrent PE with RV strain cardiac arrest with ROSC in about 30 minutes received thrombolytic therapy intubated and started on pressors and transferred to [facility]  Significant Events: 9/3 presented 9/4 Asystole arrest, TPA 100mg , intubated 9/5 off pressors, extubated  Significant studies: CT angio chest 9/04 >> b/l PE, RV:LV ratio 2.7 CT head 9/04 >> mild white matter disease Doppler legs 9/04 >> no evidence of DVT Echo 9/04 >> EF 55 to 60%, mod LVH, grade 1 DD, positive McConnell's sign, mod RV systolic dysfunction, moderate elevation in PASP, mod/severe TR   Assessment/Plan:   Acute respiratory failure with hypoxia and hypercarbia in the setting of acute pulmonary embolism with cor pulmonale: Status postextubation now off pressors on 01/12/2021 Try to wean oxygen to keep saturations greater 92%. He is currently on IV heparin will require an additional 24 hours of IV heparin infusion before transitioning to DOAC. Needs lifelong anticoagulation. Appears wet on physical exam, we have discussed with nephrology.  Essential hypertension: Continue aspirin and Norvasc, holding Catapres lisinopril Aldactone, will resume as blood pressure starts to rise.  Acute kidney injury/ATN in the setting of cardiogenic shock, lisinopril and IV contrast: With a baseline creatinine around 1.6 she had proteinuria due to diabetic nephropathy. No indication for HD at this point in time. Nephrology has been consulted he was started on bicarbonate  tablets. Holding ACE inhibitor and diuretic therapy. Urine output is picking up, spot urine is pending Renal ultrasound pending Holding IV fluids continue strict I's and O's. Nephrology to start IV lasix  Metabolic anion gap acidosis: Continue bicarbonate tablets.  Chest pain likely due to CPR:  Due to prolonged CPR Continue Tylenol.  Diabetes mellitus type 2/diabetic neuropathy: Continue to hold Neurontin in the setting of renal disease continue sliding scale.  History of GERD: Continue PPI.  History of thyroid nodule: Follow-up with PCP as an outpatient for possible biopsy if needed.   DVT prophylaxis: heparin Family Communication:none Status is: Inpatient  Remains inpatient appropriate because:Hemodynamically unstable  Dispo: The patient is from: Home              Anticipated d/c is to: SNF              Patient currently is not medically stable to d/c.   Difficult to place patient No   Code Status:     Code Status Orders  (From admission, onward)           Start     Ordered   01/11/21 0755  Full code  Continuous        01/11/21 0759           Code Status History     Date Active Date Inactive Code Status Order ID Comments User Context   10/23/2019 0627 10/28/2019 2020 Full Code [id number]  Roxan Hockey, MD ED         IV Access:   Peripheral IV   Procedures and diagnostic studies:   US RENAL  Result Date: 01/13/2021 CLINICAL DATA:  Acute kidney injury EXAM: RENAL /  URINARY TRACT ULTRASOUND COMPLETE COMPARISON:  Ultrasound 02/14/2020 FINDINGS: Right Kidney: Renal measurements: 10.5 x 6.1 x 6.2 cm = volume: 205.7 mL. Echogenicity within normal limits. Mild cortical thinning. No hydronephrosis. Cyst at the upper pole measuring 3.9 x 4.4 x 3 cm. Left Kidney: Renal measurements: 9.8 x 5 x 5.4 cm = volume: 151.1 mL. Echogenicity within normal limits. No mass or hydronephrosis Bladder: Appears normal for degree of bladder distention. Other: None.  IMPRESSION: 1. Negative for hydronephrosis. 2. Mild renal cortical thinning on the right. Simple right renal cyst Electronically Signed   By: Donavan Foil M.D.   On: 01/13/2021 19:51     Medical Consultants:   None.   Subjective:    Kathleen [name] she relates she is a little bit wheezy, still complaining of soreness in her chest.  Objective:    Vitals:   01/13/21 2111 01/13/21 2341 01/14/21 0405 01/14/21 0740  BP: 137/72 (!) 146/62 (!) 160/77 (!) 141/78  Pulse: (!) 110 (!) 105 (!) 108 (!) 103  Resp: 20 20 (!) 22 20  Temp: 98.7 F (37.1 C) 98.2 F (36.8 C) 97.9 F (36.6 C) 97.7 F (36.5 C)  TempSrc: Oral Oral Oral Oral  SpO2: 100% 100% 99% 100%  Weight: 104.7 kg     Height: 5\' 6"  (1.676 m)      SpO2: 100 % O2 Flow Rate (L/min): 2 L/min FiO2 (%): 40 %   Intake/Output Summary (Last 24 hours) at 01/14/2021 0836 Last data filed at 01/14/2021 0830 Gross per 24 hour  Intake 1252.21 ml  Output 1050 ml  Net 202.21 ml   Filed Weights   01/12/21 0549 01/13/21 0335 01/13/21 2111  Weight: 105 kg 105.6 kg 104.7 kg    Exam: General exam: In no acute distress. Respiratory system: Good air movement and crackles at bases bilaterally Cardiovascular system: S1 & S2 heard, RRR. No JVD. Gastrointestinal system: Abdomen is nondistended, soft and nontender.  Extremities: Trace lower extremity edema Skin: No rashes, lesions or ulcers Psychiatry: Judgement and insight appear normal. Mood & affect appropriate.    Data Reviewed:    Labs: Basic Metabolic Panel: Recent Labs  Lab 01/11/21 0655 01/11/21 0700 01/11/21 1300 01/12/21 0341 01/12/21 0557 01/13/21 0522 01/14/21 0113  NA 139   < > 139 139 138 134* 133*  K 3.4*   < > 4.0 4.3 4.6 4.9 5.5*  CL 101  --  102  --  102 103 103  CO2 22  --  18*  --  23 19* 17*  GLUCOSE 449*  --  337*  --  81 176* 171*  BUN 34*  --  39*  --  45* 49* 52*  CREATININE 2.31*  --  2.86*  --  4.36* 5.43* 5.71*  CALCIUM 8.1*  --  8.4*  --  8.4*  7.8* 8.3*  MG 1.5*  --  1.9  --  1.5*  --   --    < > = values in this interval not displayed.   GFR Estimated Creatinine Clearance: 11.1 mL/min (A) (by C-G formula based on SCr of 5.71 mg/dL (H)). Liver Function Tests: Recent Labs  Lab 01/10/21 2316 01/11/21 0655 01/12/21 0557  AST 22 203* 87*  ALT 26 203* 124*  ALKPHOS 99 114 80  BILITOT 0.4 0.9 0.4  PROT 8.0 5.9* 5.4*  ALBUMIN 4.1 2.7* 2.4*   No results for input(s): LIPASE, AMYLASE in the last 168 hours. No results for input(s): AMMONIA in the last 168  hours. Coagulation profile Recent Labs  Lab 01/11/21 0655  INR >10.0*   COVID-19 Labs  No results for input(s): DDIMER, FERRITIN, LDH, CRP in the last 72 hours.  Lab Results  Component Value Date   SARSCOV2NAA NEGATIVE 01/11/2021   Staves NEGATIVE 10/22/2019    CBC: Recent Labs  Lab 01/10/21 2316 01/11/21 0120 01/11/21 0655 01/11/21 0700 01/12/21 0341 01/12/21 0557 01/13/21 0522 01/14/21 0538  WBC 9.2 9.8 21.8*  --   --  13.3* 11.9* 12.4*  NEUTROABS 6.3  --   --   --   --   --   --   --   HGB 11.3* 11.3* 11.3* 12.2 10.5* 10.4* 9.1* 8.1*  HCT 36.6 36.1 36.4 36.0 31.0* 32.1* 28.4* 25.6*  MCV 87.8 88.7 87.1  --   --  81.9 84.5 84.8  PLT 219 232 224  --   --  178 167 171   Cardiac Enzymes: No results for input(s): CKTOTAL, CKMB, CKMBINDEX, TROPONINI in the last 168 hours. BNP (last 3 results) No results for input(s): PROBNP in the last 8760 hours. CBG: Recent Labs  Lab 01/13/21 1106 01/13/21 1535 01/13/21 1942 01/13/21 2136 01/14/21 0624  GLUCAP 209* 189* 171* 175* 171*   D-Dimer: No results for input(s): DDIMER in the last 72 hours. Hgb A1c: Recent Labs    01/12/21 0930  HGBA1C 7.6*   Lipid Profile: No results for input(s): CHOL, HDL, LDLCALC, TRIG, CHOLHDL, LDLDIRECT in the last 72 hours. Thyroid function studies: No results for input(s): TSH, T4TOTAL, T3FREE, THYROIDAB in the last 72 hours.  Invalid input(s): FREET3 Anemia work  up: No results for input(s): VITAMINB12, FOLATE, FERRITIN, TIBC, IRON, RETICCTPCT in the last 72 hours. Sepsis Labs: Recent Labs  Lab 01/11/21 0655 01/12/21 0557 01/13/21 0522 01/14/21 0538  WBC 21.8* 13.3* 11.9* 12.4*  LATICACIDVEN 7.4*  --   --   --    Microbiology Recent Results (from the past 240 hour(s))  Resp Panel by RT-PCR (Flu A&B, Covid) Nasopharyngeal Swab     Status: None   Collection Time: 01/11/21  4:48 AM   Specimen: Nasopharyngeal Swab; Nasopharyngeal(NP) swabs in vial transport medium  Result Value Ref Range Status   SARS Coronavirus 2 by RT PCR NEGATIVE NEGATIVE Final    Comment: (NOTE) SARS-CoV-2 target nucleic acids are NOT DETECTED.  The SARS-CoV-2 RNA is generally detectable in upper respiratory specimens during the acute phase of infection. The lowest concentration of SARS-CoV-2 viral copies this assay can detect is 138 copies/mL. A negative result does not preclude SARS-Cov-2 infection and should not be used as the sole basis for treatment or other patient management decisions. A negative result may occur with  improper specimen collection/handling, submission of specimen other than nasopharyngeal swab, presence of viral mutation(s) within the areas targeted by this assay, and inadequate number of viral copies(<138 copies/mL). A negative result must be combined with clinical observations, patient history, and epidemiological information. The expected result is Negative.  Fact Sheet for Patients:  EntrepreneurPulse.com.au  Fact Sheet for Healthcare Providers:  IncredibleEmployment.be  This test is no t yet approved or cleared by the [place] FDA and  has been authorized for detection and/or diagnosis of SARS-CoV-2 by FDA under an Emergency Use Authorization (EUA). This EUA will remain  in effect (meaning this test can be used) for the duration of the COVID-19 declaration under Section 564(b)(1) of the Act,  21 U.S.C.section 360bbb-3(b)(1), unless the authorization is terminated  or revoked sooner.  Influenza A by PCR NEGATIVE NEGATIVE Final   Influenza B by PCR NEGATIVE NEGATIVE Final    Comment: (NOTE) The Xpert Xpress SARS-CoV-2/FLU/RSV plus assay is intended as an aid in the diagnosis of influenza from Nasopharyngeal swab specimens and should not be used as a sole basis for treatment. Nasal washings and aspirates are unacceptable for Xpert Xpress SARS-CoV-2/FLU/RSV testing.  Fact Sheet for Patients: EntrepreneurPulse.com.au  Fact Sheet for Healthcare Providers: IncredibleEmployment.be  This test is not yet approved or cleared by the [place] FDA and has been authorized for detection and/or diagnosis of SARS-CoV-2 by FDA under an Emergency Use Authorization (EUA). This EUA will remain in effect (meaning this test can be used) for the duration of the COVID-19 declaration under Section 564(b)(1) of the Act, 21 U.S.C. section 360bbb-3(b)(1), unless the authorization is terminated or revoked.  Performed at [facility], [address] [place], [place] [address]   MRSA Next Gen by PCR, Nasal     Status: None   Collection Time: 01/11/21  6:03 AM   Specimen: Nasal Mucosa; Nasal Swab  Result Value Ref Range Status   MRSA by PCR Next Gen NOT DETECTED NOT DETECTED Final    Comment: (NOTE) The GeneXpert MRSA Assay (FDA approved for NASAL specimens only), is one component of a comprehensive MRSA colonization surveillance program. It is not intended to diagnose MRSA infection nor to guide or monitor treatment for MRSA infections. Test performance is not FDA approved in patients less than 54 years old. Performed at [facility], [place] [address] [place], [place] [address]      Medications:    acetaminophen  1,000 mg Oral BID   amLODipine  10 mg Oral Daily   aspirin EC  81 mg Oral Daily   Chlorhexidine Gluconate Cloth  6 each  Topical Daily   cholecalciferol  1,000 Units Oral Daily   insulin aspart  0-20 Units Subcutaneous TID WC   insulin aspart  0-5 Units Subcutaneous QHS   mouth rinse  15 mL Mouth Rinse BID   pantoprazole (PROTONIX) IV  40 mg Intravenous QHS   sodium bicarbonate  1,300 mg Oral BID   Continuous Infusions:  sodium chloride Stopped (01/11/21 0813)   heparin 1,500 Units/hr (01/14/21 8182)      LOS: 3 days   Charlynne Cousins  [facility]  01/14/2021, 8:36 AM

## 2021-01-14 NOTE — Progress Notes (Signed)
[facility] Progress Note Patient Name: GRACEMARIE SKEET DOB: [birth year] MRN: [id number]   Date of Service  01/14/2021  HPI/Events of Note  Patient is sore from prior CPR and Tylenol is not adequately relieving the pain.  eICU Interventions  Oxycodone 5 mg po Q 6 hours PRN pain x 4 doses then discontinue.        Kerry Kass Kariel Skillman 01/14/2021, 12:04 AM

## 2021-01-14 NOTE — Progress Notes (Signed)
eLink Physician-Brief Progress Note Patient Name: Kathleen [name] DOB: [birth year] MRN: [id number]   Date of Service  01/14/2021  HPI/Events of Note  Patient with reproducible sternal pain (I had the bedside RN palpate the sternum and the pain was produced) secondary to CPR. Prior CXR did indicate fracture.  eICU Interventions  Dilaudid 0.5 mg iv ordered for pain not fully abolished by Oxycodone.        Kerry Kass Westley Blass 01/14/2021, 3:07 AM

## 2021-01-15 ENCOUNTER — Ambulatory Visit (HOSPITAL_COMMUNITY)
Admission: RE | Admit: 2021-01-15 | Discharge: 2021-01-15 | Disposition: A | Discharge status: Home / Self Care | Payer: Medicare Other | Source: Ambulatory Visit | Attending: Family Medicine | Admitting: Family Medicine

## 2021-01-15 DIAGNOSIS — I2699 Other pulmonary embolism without acute cor pulmonale: Secondary | ICD-10-CM | POA: Diagnosis not present

## 2021-01-15 DIAGNOSIS — N179 Acute kidney failure, unspecified: Secondary | ICD-10-CM | POA: Diagnosis not present

## 2021-01-15 DIAGNOSIS — R579 Shock, unspecified: Secondary | ICD-10-CM | POA: Diagnosis not present

## 2021-01-15 DIAGNOSIS — I469 Cardiac arrest, cause unspecified: Secondary | ICD-10-CM | POA: Diagnosis not present

## 2021-01-15 LAB — GLUCOSE, CAPILLARY
Glucose-Capillary: 211 mg/dL — ABNORMAL HIGH (ref 70–99)
Glucose-Capillary: 202 mg/dL — ABNORMAL HIGH (ref 70–99)
Glucose-Capillary: 181 mg/dL — ABNORMAL HIGH (ref 70–99)
Glucose-Capillary: 190 mg/dL — ABNORMAL HIGH (ref 70–99)

## 2021-01-15 LAB — BASIC METABOLIC PANEL
Anion gap: 11 (ref 5–15)
BUN: 57 mg/dL — ABNORMAL HIGH (ref 8–23)
CO2: 23 mmol/L (ref 22–32)
Calcium: 8.7 mg/dL — ABNORMAL LOW (ref 8.9–10.3)
Chloride: 100 mmol/L (ref 98–111)
Creatinine, Ser: 5.75 mg/dL — ABNORMAL HIGH (ref 0.44–1.00)
GFR, Estimated: 7 mL/min — ABNORMAL LOW (ref >60)
Glucose, Bld: 203 mg/dL — ABNORMAL HIGH (ref 70–99)
Potassium: 5.7 mmol/L — ABNORMAL HIGH (ref 3.5–5.1)
Sodium: 134 mmol/L — ABNORMAL LOW (ref 135–145)

## 2021-01-15 LAB — CBC
HCT: 25.7 % — ABNORMAL LOW (ref 36.0–46.0)
Hemoglobin: 8 g/dL — ABNORMAL LOW (ref 12.0–15.0)
MCH: 26.8 pg (ref 26.0–34.0)
MCHC: 31.1 g/dL (ref 30.0–36.0)
MCV: 86.2 fL (ref 80.0–100.0)
Platelets: 217 10*3/uL (ref 150–400)
RBC: 2.98 MIL/uL — ABNORMAL LOW (ref 3.87–5.11)
RDW: 14.6 % (ref 11.5–15.5)
WBC: 12.7 10*3/uL — ABNORMAL HIGH (ref 4.0–10.5)
nRBC: 0 % (ref 0.0–0.2)

## 2021-01-15 LAB — HEPARIN LEVEL (UNFRACTIONATED): Heparin Unfractionated: 0.53 IU/mL (ref 0.30–0.70)

## 2021-01-15 MED ORDER — SODIUM ZIRCONIUM CYCLOSILICATE 10 G PO PACK: 10.0000 g | PACK | Freq: Once | ORAL | Status: DC

## 2021-01-15 MED ORDER — SODIUM BICARBONATE 650 MG PO TABS
650.0000 mg | ORAL_TABLET | Freq: Two times a day (BID) | ORAL | Status: DC
  Administered 2021-01-15 – 2021-01-18 (×6): 650 mg via ORAL
  Filled 2021-01-15 (×6): qty 1

## 2021-01-15 MED ORDER — METHYLPREDNISOLONE SODIUM SUCC 40 MG IJ SOLR
40.0000 mg | Freq: Two times a day (BID) | INTRAMUSCULAR | Status: DC
  Administered 2021-01-15: 40 mg via INTRAVENOUS
  Filled 2021-01-15: qty 1

## 2021-01-15 MED ORDER — SODIUM ZIRCONIUM CYCLOSILICATE 10 G PO PACK
10.0000 g | PACK | Freq: Two times a day (BID) | ORAL | Status: AC
  Administered 2021-01-15 (×2): 10 g via ORAL
  Filled 2021-01-15 (×2): qty 1

## 2021-01-15 NOTE — Care Management Important Message (Signed)
Important Message  Patient Details  Name: Kathleen [name] MRN: [id number] Date of Birth: 1951-04-09   Medicare Important Message Given:  Yes     Shelda Altes 01/15/2021, 9:55 AM

## 2021-01-15 NOTE — Progress Notes (Addendum)
[facility] NEPHROLOGY PROGRESS NOTE  Assessment/ Plan: Pt is a 70 y.o. yo female  with a history of HTN, type 2 DM, HLD, obesity, CKD IIIb, and unprovoked PE in 2021 recently transitioned off eliquis who presented with dyspnea and dizziness, found to have bilateral recurrent PE with right ventricular strain. She had cardiac arrest with ROSC after about 30 min. She received thrombolytic therapy, was intubated and started on pressors. She was seen as a consult for AKI on CKD. Chronic renal disease thought to be due to diabetic nephropathy.    # AKI on CKD stage IIIb- likely ATN in the setting of cardiogenic shock, ACE inhibitor use and IV contrast. Spot urine PCR pending. Creatine stable today at 5.75, expect it to downtrend/improve. No uremic symptoms. Continue to trend BMP.   # Hyperkalemia- K 5.7, will repeat lokelma today and recommend low potassium diet.   # Anion gap metabolic acidosis- resolved, bicarb normalized. Decrease sodium bicarbonate to 650 mg bid.    # Acute hypoxic/hypercapnic respiratory failure- in the setting of PE with acute cor pulmonale. S/p extubation on 9/5. On IV heparin and anticoagulation per pulm. She continues to feel SOB despite good urine output (~1.5L) on IV lasix x1, appears euvolemic on exam and repeat CXR is reassuring with improvement. Appreciated wheezing on exam, can consider a breathing treatment; although denies any history of COPD or lung disease.    # Cardiac arrest/cardiogenic shock- s/p CPR and ROSC after 30 min, in the setting of PE and acute cor pulmonale.     Subjective:    Patient reports she continues to feel short of breath. Denies any other complaints.  Objective Vital signs in last 24 hours: Vitals:   01/14/21 1950 01/14/21 2335 01/15/21 0400 01/15/21 0805  BP: (!) 143/64 (!) 154/68 (!) 151/63 (!) 144/70  Pulse: 100 100 (!) 104 (!) 109  Resp: 20 20 18 20   Temp: 98.7 F (37.1 C) 98.3 F (36.8 C) 98.6 F (37 C) 98.6 F  (37 C)  TempSrc: Oral Oral Oral Oral  SpO2: 98% 98% 100% 99%  Weight:      Height:       Weight change:   Intake/Output Summary (Last 24 hours) at 01/15/2021 0831 Last data filed at 01/15/2021 0501 Gross per 24 hour  Intake 453.76 ml  Output 2250 ml  Net -1796.24 ml       Labs: Basic Metabolic Panel: Recent Labs  Lab 01/13/21 0522 01/14/21 0113 01/15/21 0710  NA 134* 133* 134*  K 4.9 5.5* 5.7*  CL 103 103 100  CO2 19* 17* 23  GLUCOSE 176* 171* 203*  BUN 49* 52* 57*  CREATININE 5.43* 5.71* 5.75*  CALCIUM 7.8* 8.3* 8.7*   Liver Function Tests: Recent Labs  Lab 01/10/21 2316 01/11/21 0655 01/12/21 0557  AST 22 203* 87*  ALT 26 203* 124*  ALKPHOS 99 114 80  BILITOT 0.4 0.9 0.4  PROT 8.0 5.9* 5.4*  ALBUMIN 4.1 2.7* 2.4*   No results for input(s): LIPASE, AMYLASE in the last 168 hours. No results for input(s): AMMONIA in the last 168 hours. CBC: Recent Labs  Lab 01/10/21 2316 01/11/21 0120 01/11/21 0655 01/11/21 0700 01/12/21 0557 01/13/21 0522 01/14/21 0538 01/15/21 0144  WBC 9.2   < > 21.8*  --  13.3* 11.9* 12.4* 12.7*  NEUTROABS 6.3  --   --   --   --   --   --   --   HGB 11.3*   < >  11.3*   < > 10.4* 9.1* 8.1* 8.0*  HCT 36.6   < > 36.4   < > 32.1* 28.4* 25.6* 25.7*  MCV 87.8   < > 87.1  --  81.9 84.5 84.8 86.2  PLT 219   < > 224  --  178 167 171 217   < > = values in this interval not displayed.   Cardiac Enzymes: No results for input(s): CKTOTAL, CKMB, CKMBINDEX, TROPONINI in the last 168 hours. CBG: Recent Labs  Lab 01/14/21 0624 01/14/21 1150 01/14/21 1634 01/14/21 2129 01/15/21 0628  GLUCAP 171* 130* 116* 152* 211*    Iron Studies: No results for input(s): IRON, TIBC, TRANSFERRIN, FERRITIN in the last 72 hours. Studies/Results: US RENAL  Result Date: 01/13/2021 CLINICAL DATA:  Acute kidney injury EXAM: RENAL / URINARY TRACT ULTRASOUND COMPLETE COMPARISON:  Ultrasound 02/14/2020 FINDINGS: Right Kidney: Renal measurements: 10.5 x 6.1 x  6.2 cm = volume: 205.7 mL. Echogenicity within normal limits. Mild cortical thinning. No hydronephrosis. Cyst at the upper pole measuring 3.9 x 4.4 x 3 cm. Left Kidney: Renal measurements: 9.8 x 5 x 5.4 cm = volume: 151.1 mL. Echogenicity within normal limits. No mass or hydronephrosis Bladder: Appears normal for degree of bladder distention. Other: None. IMPRESSION: 1. Negative for hydronephrosis. 2. Mild renal cortical thinning on the right. Simple right renal cyst Electronically Signed   By: Donavan Foil M.D.   On: 01/13/2021 19:51   DG Chest Port 1V same Day  Result Date: 01/14/2021 CLINICAL DATA:  Sudden onset of shortness of breath today. EXAM: PORTABLE CHEST 1 VIEW COMPARISON:  01/12/2021 FINDINGS: Interval extubation. Continued basilar improvement with residual streaky opacities at both lung bases. Stable heart size and mediastinal contours. No new consolidation. No pulmonary edema. No pneumothorax. No large volume pleural fluid. IMPRESSION: Interval extubation with continued basilar improvement in streaky opacities. No new abnormality. Electronically Signed   By: Keith Rake M.D.   On: 01/14/2021 18:14    Medications: Infusions:  sodium chloride Stopped (01/11/21 0813)   heparin 1,450 Units/hr (01/15/21 0501)    Scheduled Medications:  acetaminophen  1,000 mg Oral BID   amLODipine  10 mg Oral Daily   aspirin EC  81 mg Oral Daily   Chlorhexidine Gluconate Cloth  6 each Topical Daily   cholecalciferol  1,000 Units Oral Daily   insulin aspart  0-20 Units Subcutaneous TID WC   insulin aspart  0-5 Units Subcutaneous QHS   mouth rinse  15 mL Mouth Rinse BID   methylPREDNISolone (SOLU-MEDROL) injection  40 mg Intravenous Q12H   pantoprazole (PROTONIX) IV  40 mg Intravenous QHS   sodium bicarbonate  1,300 mg Oral BID    have reviewed scheduled and prn medications.  Physical Exam: General:NAD, comfortable Heart:RRR, s1s2 nl Lungs:bilateral wheezing, on 2L [place] Abdomen:soft,  Non-tender, non-distended Extremities:No edema Dialysis Access: n/a   Elaiza Shoberg N Wilford Merryfield 01/15/2021,8:31 AM  LOS: 4 days

## 2021-01-15 NOTE — Progress Notes (Signed)
ANTICOAGULATION CONSULT NOTE - Follow Up Consult  Pharmacy Consult for IV Heparin Indication: pulmonary embolus  No Known Allergies  Patient Measurements: Height: 5\' 6"  (167.6 cm) Weight: 104.7 kg (230 lb 13.2 oz) IBW/kg (Calculated) : 59.3 Heparin Dosing Weight: 81.6 kg  Vital Signs: Temp: 98.6 F (37 C) (09/08 0805) Temp Source: Oral (09/08 0805) BP: 144/70 (09/08 0805) Pulse Rate: 109 (09/08 0805)  Labs: Recent Labs    01/13/21 0522 01/13/21 7588 01/14/21 0113 01/14/21 0538 01/14/21 1043 01/14/21 2036 01/15/21 0144 01/15/21 0710  HGB 9.1*  --   --  8.1*  --   --  8.0*  --   HCT 28.4*  --   --  25.6*  --   --  25.7*  --   PLT 167  --   --  171  --   --  217  --   HEPARINUNFRC  --    < > 0.25*  --  0.64 0.62 0.53  --   CREATININE 5.43*  --  5.71*  --   --   --   --  5.75*   < > = values in this interval not displayed.    Estimated Creatinine Clearance: 11 mL/min (A) (by C-G formula based on SCr of 5.75 mg/dL (H)).   Assessment: 70 years of age female who arrested at [facility] with confirmed PE and received alteplase 100mg  on 9/04 @ 0300. Now >24 hours since Alteplase, continue heparin IV with goal heparin level between 0.3 to 0.7.   No bleeding or issues noted. Noted AKI - continues to worsen with SCr up to 5.75 -- at risk for accumulation. Will monitor levels closely. Now therapeutic x2 heparin levels. Per chart review, no s/sx bleeding. No changes needed at this time.  Goal of Therapy:  Heparin level 0.3-0.7 units/ml (>24hrs post Alteplase) Monitor platelets by anticoagulation protocol: Yes   Plan:  - Continue IV Heparin at 1450 units/hr - Check daily heparin level and CBC - Monitor daily CBC, s/sx bleeding   Thank you for allowing pharmacy to be a part of this patient's care.  Ardyth Harps, PharmD Clinical Pharmacist

## 2021-01-15 NOTE — Progress Notes (Addendum)
[facility] PROGRESS NOTE    Progress Note  Kathleen [name]  LNL:[id number] DOB: Mar 06, 1951 DOA: 01/10/2021 PCP: Fayrene Helper, MD     Brief Narrative:   Kathleen [name] is an 70 y.o. female past medical history of essential hypertension, diabetes mellitus type 2, chronic kidney disease stage IIIb, PE who was taken off Eliquis in July 2022 initially presented to [facility] 01/10/2021 with dyspnea and dizziness, was found to have recurrent PE with RV strain cardiac arrest with ROSC in about 30 minutes received thrombolytic therapy intubated and started on pressors and transferred to [facility]  Significant Events: 9/3 presented 9/4 Asystole arrest, TPA 100mg , intubated 9/5 off pressors, extubated  Significant studies: CT angio chest 9/04 >> b/l PE, RV:LV ratio 2.7 CT head 9/04 >> mild white matter disease Doppler legs 9/04 >> no evidence of DVT Echo 9/04 >> EF 55 to 60%, mod LVH, grade 1 DD, positive McConnell's sign, mod RV systolic dysfunction, moderate elevation in PASP, mod/severe TR   Assessment/Plan:   Acute respiratory failure with hypoxia and hypercarbia in the setting of acute pulmonary embolism with cor pulmonale: Try to wean oxygen to keep saturations greater 92%. She is currently on IV heparin will require an additional 24 hours of IV heparin infusion before transitioning to DOAC. Needs lifelong anticoagulation. She relates she continues to be short of breath question pulmonary edema she was started on IV Lasix by nephrology her respiration is stabilized but still she relates she feels short of breath goal saturations greater 90% she is currently on 2 L. Cont. Steroids and inhalers. A 2D echo was done that showed an EF of 55% and grade 1 diastolic heart failure. Hopefully we can diurese her further.   Chest x-ray was done that showed bibasilar improved and streaky opacities.  Essential hypertension: Continue aspirin and Norvasc, holding Catapres  lisinopril Aldactone, will resume as blood pressure starts to rise.  Acute kidney injury/ATN in the setting of cardiogenic shock, lisinopril and IV contrast: With a baseline creatinine around 1.6 she had proteinuria due to diabetic nephropathy. No indication for HD at this point in time. Nephrology has been consulted he was started on bicarbonate tablets. Urine output continues to increase, continue to hold IV fluids, monitor strict I's and O's. Further management per nephrology. Potassium is slightly high creatinine has stabilized at 5.7.  She continues to be positive about 4 L.  Metabolic anion gap acidosis: Continue bicarbonate tablets.  Chest pain likely due to CPR:  Due to prolonged CPR Continue Tylenol.  Diabetes mellitus type 2/diabetic neuropathy: Continue to hold Neurontin in the setting of renal disease continue sliding scale.  History of GERD: Continue PPI.  History of thyroid nodule: Follow-up with PCP as an outpatient for possible biopsy if needed.   DVT prophylaxis: heparin Family Communication:none Status is: Inpatient  Remains inpatient appropriate because:Hemodynamically unstable  Dispo: The patient is from: Home              Anticipated d/c is to: SNF              Patient currently is not medically stable to d/c.   Difficult to place patient No   Code Status:     Code Status Orders  (From admission, onward)           Start     Ordered   01/11/21 0755  Full code  Continuous        01/11/21 0759  Code Status History     Date Active Date Inactive Code Status Order ID Comments User Context   10/23/2019 0627 10/28/2019 2020 Full Code [id number]  Roxan Hockey, MD ED         IV Access:   Peripheral IV   Procedures and diagnostic studies:   US RENAL  Result Date: 01/13/2021 CLINICAL DATA:  Acute kidney injury EXAM: RENAL / URINARY TRACT ULTRASOUND COMPLETE COMPARISON:  Ultrasound 02/14/2020 FINDINGS: Right Kidney: Renal  measurements: 10.5 x 6.1 x 6.2 cm = volume: 205.7 mL. Echogenicity within normal limits. Mild cortical thinning. No hydronephrosis. Cyst at the upper pole measuring 3.9 x 4.4 x 3 cm. Left Kidney: Renal measurements: 9.8 x 5 x 5.4 cm = volume: 151.1 mL. Echogenicity within normal limits. No mass or hydronephrosis Bladder: Appears normal for degree of bladder distention. Other: None. IMPRESSION: 1. Negative for hydronephrosis. 2. Mild renal cortical thinning on the right. Simple right renal cyst Electronically Signed   By: Donavan Foil M.D.   On: 01/13/2021 19:51   DG Chest Port 1V same Day  Result Date: 01/14/2021 CLINICAL DATA:  Sudden onset of shortness of breath today. EXAM: PORTABLE CHEST 1 VIEW COMPARISON:  01/12/2021 FINDINGS: Interval extubation. Continued basilar improvement with residual streaky opacities at both lung bases. Stable heart size and mediastinal contours. No new consolidation. No pulmonary edema. No pneumothorax. No large volume pleural fluid. IMPRESSION: Interval extubation with continued basilar improvement in streaky opacities. No new abnormality. Electronically Signed   By: Keith Rake M.D.   On: 01/14/2021 18:14     Medical Consultants:   None.   Subjective:    Kathleen [name] wheezing has improved, she relates she still feels short of breath.  Objective:    Vitals:   01/14/21 1950 01/14/21 2335 01/15/21 0400 01/15/21 0805  BP: (!) 143/64 (!) 154/68 (!) 151/63 (!) 144/70  Pulse: 100 100 (!) 104 (!) 109  Resp: 20 20 18 20   Temp: 98.7 F (37.1 C) 98.3 F (36.8 C) 98.6 F (37 C) 98.6 F (37 C)  TempSrc: Oral Oral Oral Oral  SpO2: 98% 98% 100% 99%  Weight:      Height:       SpO2: 99 % O2 Flow Rate (L/min): 2 L/min FiO2 (%): 40 %   Intake/Output Summary (Last 24 hours) at 01/15/2021 0814 Last data filed at 01/15/2021 0501 Gross per 24 hour  Intake 573.76 ml  Output 2250 ml  Net -1676.24 ml    Filed Weights   01/12/21 0549 01/13/21 0335 01/13/21  2111  Weight: 105 kg 105.6 kg 104.7 kg    Exam: General exam: In no acute distress. Respiratory system: Good air movement and clear to auscultation. Cardiovascular system: S1 & S2 heard, RRR. No JVD. Gastrointestinal system: Abdomen is nondistended, soft and nontender.  Extremities: No pedal edema. Skin: No rashes, lesions or ulcers  Data Reviewed:    Labs: Basic Metabolic Panel: Recent Labs  Lab 01/11/21 0655 01/11/21 0700 01/11/21 1300 01/12/21 0341 01/12/21 0557 01/13/21 0522 01/14/21 0113 01/15/21 0710  NA 139   < > 139 139 138 134* 133* 134*  K 3.4*   < > 4.0 4.3 4.6 4.9 5.5* 5.7*  CL 101  --  102  --  102 103 103 100  CO2 22  --  18*  --  23 19* 17* 23  GLUCOSE 449*  --  337*  --  81 176* 171* 203*  BUN 34*  --  39*  --  45* 49* 52* 57*  CREATININE 2.31*  --  2.86*  --  4.36* 5.43* 5.71* 5.75*  CALCIUM 8.1*  --  8.4*  --  8.4* 7.8* 8.3* 8.7*  MG 1.5*  --  1.9  --  1.5*  --   --   --    < > = values in this interval not displayed.    GFR Estimated Creatinine Clearance: 11 mL/min (A) (by C-G formula based on SCr of 5.75 mg/dL (H)). Liver Function Tests: Recent Labs  Lab 01/10/21 2316 01/11/21 0655 01/12/21 0557  AST 22 203* 87*  ALT 26 203* 124*  ALKPHOS 99 114 80  BILITOT 0.4 0.9 0.4  PROT 8.0 5.9* 5.4*  ALBUMIN 4.1 2.7* 2.4*    No results for input(s): LIPASE, AMYLASE in the last 168 hours. No results for input(s): AMMONIA in the last 168 hours. Coagulation profile Recent Labs  Lab 01/11/21 0655  INR >10.0*    COVID-19 Labs  No results for input(s): DDIMER, FERRITIN, LDH, CRP in the last 72 hours.  Lab Results  Component Value Date   SARSCOV2NAA NEGATIVE 01/11/2021   Anthony NEGATIVE 10/22/2019    CBC: Recent Labs  Lab 01/10/21 2316 01/11/21 0120 01/11/21 0655 01/11/21 0700 01/12/21 0341 01/12/21 0557 01/13/21 0522 01/14/21 0538 01/15/21 0144  WBC 9.2   < > 21.8*  --   --  13.3* 11.9* 12.4* 12.7*  NEUTROABS 6.3  --   --    --   --   --   --   --   --   HGB 11.3*   < > 11.3*   < > 10.5* 10.4* 9.1* 8.1* 8.0*  HCT 36.6   < > 36.4   < > 31.0* 32.1* 28.4* 25.6* 25.7*  MCV 87.8   < > 87.1  --   --  81.9 84.5 84.8 86.2  PLT 219   < > 224  --   --  178 167 171 217   < > = values in this interval not displayed.    Cardiac Enzymes: No results for input(s): CKTOTAL, CKMB, CKMBINDEX, TROPONINI in the last 168 hours. BNP (last 3 results) No results for input(s): PROBNP in the last 8760 hours. CBG: Recent Labs  Lab 01/14/21 0624 01/14/21 1150 01/14/21 1634 01/14/21 2129 01/15/21 0628  GLUCAP 171* 130* 116* 152* 211*    D-Dimer: No results for input(s): DDIMER in the last 72 hours. Hgb A1c: Recent Labs    01/12/21 0930  HGBA1C 7.6*    Lipid Profile: No results for input(s): CHOL, HDL, LDLCALC, TRIG, CHOLHDL, LDLDIRECT in the last 72 hours. Thyroid function studies: No results for input(s): TSH, T4TOTAL, T3FREE, THYROIDAB in the last 72 hours.  Invalid input(s): FREET3 Anemia work up: No results for input(s): VITAMINB12, FOLATE, FERRITIN, TIBC, IRON, RETICCTPCT in the last 72 hours. Sepsis Labs: Recent Labs  Lab 01/11/21 0655 01/12/21 0557 01/13/21 0522 01/14/21 0538 01/15/21 0144  WBC 21.8* 13.3* 11.9* 12.4* 12.7*  LATICACIDVEN 7.4*  --   --   --   --     Microbiology Recent Results (from the past 240 hour(s))  Resp Panel by RT-PCR (Flu A&B, Covid) Nasopharyngeal Swab     Status: None   Collection Time: 01/11/21  4:48 AM   Specimen: Nasopharyngeal Swab; Nasopharyngeal(NP) swabs in vial transport medium  Result Value Ref Range Status   SARS Coronavirus 2 by RT PCR NEGATIVE NEGATIVE Final    Comment: (NOTE) SARS-CoV-2 target nucleic acids are NOT  DETECTED.  The SARS-CoV-2 RNA is generally detectable in upper respiratory specimens during the acute phase of infection. The lowest concentration of SARS-CoV-2 viral copies this assay can detect is 138 copies/mL. A negative result does not  preclude SARS-Cov-2 infection and should not be used as the sole basis for treatment or other patient management decisions. A negative result may occur with  improper specimen collection/handling, submission of specimen other than nasopharyngeal swab, presence of viral mutation(s) within the areas targeted by this assay, and inadequate number of viral copies(<138 copies/mL). A negative result must be combined with clinical observations, patient history, and epidemiological information. The expected result is Negative.  Fact Sheet for Patients:  EntrepreneurPulse.com.au  Fact Sheet for Healthcare Providers:  IncredibleEmployment.be  This test is no t yet approved or cleared by the [place] FDA and  has been authorized for detection and/or diagnosis of SARS-CoV-2 by FDA under an Emergency Use Authorization (EUA). This EUA will remain  in effect (meaning this test can be used) for the duration of the COVID-19 declaration under Section 564(b)(1) of the Act, 21 U.S.C.section 360bbb-3(b)(1), unless the authorization is terminated  or revoked sooner.       Influenza A by PCR NEGATIVE NEGATIVE Final   Influenza B by PCR NEGATIVE NEGATIVE Final    Comment: (NOTE) The Xpert Xpress SARS-CoV-2/FLU/RSV plus assay is intended as an aid in the diagnosis of influenza from Nasopharyngeal swab specimens and should not be used as a sole basis for treatment. Nasal washings and aspirates are unacceptable for Xpert Xpress SARS-CoV-2/FLU/RSV testing.  Fact Sheet for Patients: EntrepreneurPulse.com.au  Fact Sheet for Healthcare Providers: IncredibleEmployment.be  This test is not yet approved or cleared by the [place] FDA and has been authorized for detection and/or diagnosis of SARS-CoV-2 by FDA under an Emergency Use Authorization (EUA). This EUA will remain in effect (meaning this test can be used) for the  duration of the COVID-19 declaration under Section 564(b)(1) of the Act, 21 U.S.C. section 360bbb-3(b)(1), unless the authorization is terminated or revoked.  Performed at [facility], [address], [place], [place] [address]   MRSA Next Gen by PCR, Nasal     Status: None   Collection Time: 01/11/21  6:03 AM   Specimen: Nasal Mucosa; Nasal Swab  Result Value Ref Range Status   MRSA by PCR Next Gen NOT DETECTED NOT DETECTED Final    Comment: (NOTE) The GeneXpert MRSA Assay (FDA approved for NASAL specimens only), is one component of a comprehensive MRSA colonization surveillance program. It is not intended to diagnose MRSA infection nor to guide or monitor treatment for MRSA infections. Test performance is not FDA approved in patients less than 84 years old. Performed at [facility], [place] [address] [place], [place] [address]      Medications:    acetaminophen  1,000 mg Oral BID   amLODipine  10 mg Oral Daily   aspirin EC  81 mg Oral Daily   Chlorhexidine Gluconate Cloth  6 each Topical Daily   cholecalciferol  1,000 Units Oral Daily   insulin aspart  0-20 Units Subcutaneous TID WC   insulin aspart  0-5 Units Subcutaneous QHS   mouth rinse  15 mL Mouth Rinse BID   methylPREDNISolone (SOLU-MEDROL) injection  40 mg Intravenous Q12H   pantoprazole (PROTONIX) IV  40 mg Intravenous QHS   sodium bicarbonate  1,300 mg Oral BID   Continuous Infusions:  sodium chloride Stopped (01/11/21 0813)   heparin 1,450 Units/hr (01/15/21  0501)      LOS: 4 days   [facility]  01/15/2021, 8:14 AM

## 2021-01-15 NOTE — Evaluation (Signed)
Clinical/Bedside Swallow Evaluation Patient Details  Name: Kathleen [name] MRN: [id number] Date of Birth: 09-May-1951  Today's Date: 01/15/2021 Time: SLP Start Time (ACUTE ONLY): 3086 SLP Stop Time (ACUTE ONLY): 0945 SLP Time Calculation (min) (ACUTE ONLY): 23 min  Past Medical History:  Past Medical History:  Diagnosis Date   Acute respiratory failure with hypoxia ([place]) 10/22/2019   Anemia    Arthritis    Chronic kidney disease    Diabetes mellitus type II    Dysphagia    unspecified    GERD (gastroesophageal reflux disease)    Hyperlipidemia 2000   Hypertension 1995   Insomnia    Low back pain    Obesity    PE (pulmonary thromboembolism) ([place])    Sleep apnea in adult 11/25/2019   Thyroid nodule 11/25/2019   Past Surgical History:  Past Surgical History:  Procedure Laterality Date   Carpal tunnel release     left    CHOLECYSTECTOMY  2007   DIGIT NAIL REMOVAL  08/2011   KNEE SURGERY  1.20.2012   arthroscopy LEFT knee partial medial meniscectomy   TENOTOMY     2,3,4  left foot    toenail removal     TUBAL LIGATION     HPI:  70yo female admitted 01/10/21 to [facility] with dyspnea and dizziness, due to recurrent PE. Cardiac arrest with ROSC after 30 minutes. Intubated 9/4-5/22. PMH: PE, HTN, CKD3b, HLD, GERD, thyroid nodule, DM2   Assessment / Plan / Recommendation Clinical Impression  Pt presents with adequate oral motor strength and function. She is missing most of her teeth, but reports no difficulty swallowing. CN exam is unremarkable. Pt accpeted trials of thin liquid, puree, and solid textures without obvious oral difficulty or overt s/s aspiration. Recommend dys 3 diet and thin liquids, meds as tolerated. RN informed. No further ST intervention is recommended at this time. Please reconsult if needs arise. SLP Visit Diagnosis: Dysphagia, unspecified (R13.10)    Aspiration Risk  Mild aspiration risk    Diet Recommendation Dysphagia 3 (Mech soft);Thin liquid   Liquid  Administration via: Cup;Straw Medication Administration: Whole meds with liquid Supervision: Patient able to self feed Compensations: Small sips/bites;Slow rate Postural Changes: Seated upright at 90 degrees;Remain upright for at least 30 minutes after po intake    Other  Recommendations Oral Care Recommendations: Oral care BID   Follow up Recommendations None             Prognosis Prognosis for Safe Diet Advancement: Fair Barriers/Prognosis Comment: poor dentition      Swallow Study   General Date of Onset: 01/10/21 HPI: 70yo female admitted 01/10/21 to [facility] with dyspnea and dizziness, due to recurrent PE. Cardiac arrest with ROSC after 30 minutes. Intubated 9/4-5/22. PMH: PE, HTN, CKD3b, HLD, GERD, thyroid nodule, DM2 Type of Study: Bedside Swallow Evaluation Previous Swallow Assessment: none Diet Prior to this Study: Dysphagia 3 (soft);Thin liquids Temperature Spikes Noted: No Respiratory Status: Nasal cannula History of Recent Intubation: Yes Length of Intubations (days): 1 days Date extubated: 01/12/21 Behavior/Cognition: Alert;Cooperative;Pleasant mood Oral Cavity Assessment: Within Functional Limits Oral Care Completed by SLP: No Oral Cavity - Dentition: Missing dentition Vision: Functional for self-feeding Self-Feeding Abilities: Able to feed self Patient Positioning: Upright in bed Baseline Vocal Quality: Normal Volitional Cough: Strong Volitional Swallow: Able to elicit    Oral/Motor/Sensory Function Overall Oral Motor/Sensory Function: Within functional limits   Ice Chips Ice chips: Not tested   Thin Liquid Thin Liquid: Within functional limits Presentation: Straw  Nectar Thick Nectar Thick Liquid: Not tested   Honey Thick Honey Thick Liquid: Not tested   Puree Puree: Within functional limits Presentation: Spoon;Self Fed   Solid     Solid: Within functional limits Presentation: [place] B. Kathleen [name], [facility], [place] Speech Language  Pathologist Office: [phone]  Shonna Chock 01/15/2021,9:50 AM

## 2021-01-16 DIAGNOSIS — I469 Cardiac arrest, cause unspecified: Secondary | ICD-10-CM | POA: Diagnosis not present

## 2021-01-16 DIAGNOSIS — R579 Shock, unspecified: Secondary | ICD-10-CM | POA: Diagnosis not present

## 2021-01-16 DIAGNOSIS — I2699 Other pulmonary embolism without acute cor pulmonale: Secondary | ICD-10-CM | POA: Diagnosis not present

## 2021-01-16 DIAGNOSIS — N179 Acute kidney failure, unspecified: Secondary | ICD-10-CM | POA: Diagnosis not present

## 2021-01-16 LAB — RESPIRATORY PANEL BY PCR

## 2021-01-16 LAB — HEPARIN LEVEL (UNFRACTIONATED)
Heparin Unfractionated: 0.68 IU/mL (ref 0.30–0.70)
Heparin Unfractionated: 0.71 IU/mL — ABNORMAL HIGH (ref 0.30–0.70)

## 2021-01-16 LAB — RENAL FUNCTION PANEL
Albumin: 2.7 g/dL — ABNORMAL LOW (ref 3.5–5.0)
Anion gap: 13 (ref 5–15)
BUN: 70 mg/dL — ABNORMAL HIGH (ref 8–23)
CO2: 23 mmol/L (ref 22–32)
Calcium: 8.8 mg/dL — ABNORMAL LOW (ref 8.9–10.3)
Chloride: 97 mmol/L — ABNORMAL LOW (ref 98–111)
Creatinine, Ser: 5.39 mg/dL — ABNORMAL HIGH (ref 0.44–1.00)
GFR, Estimated: 8 mL/min — ABNORMAL LOW (ref >60)
Glucose, Bld: 183 mg/dL — ABNORMAL HIGH (ref 70–99)
Phosphorus: 4.9 mg/dL — ABNORMAL HIGH (ref 2.5–4.6)
Potassium: 5.5 mmol/L — ABNORMAL HIGH (ref 3.5–5.1)
Sodium: 133 mmol/L — ABNORMAL LOW (ref 135–145)

## 2021-01-16 LAB — CBC
HCT: 23.5 % — ABNORMAL LOW (ref 36.0–46.0)
Hemoglobin: 7.3 g/dL — ABNORMAL LOW (ref 12.0–15.0)
MCH: 26.6 pg (ref 26.0–34.0)
MCHC: 31.1 g/dL (ref 30.0–36.0)
MCV: 85.8 fL (ref 80.0–100.0)
Platelets: 228 10*3/uL (ref 150–400)
RBC: 2.74 MIL/uL — ABNORMAL LOW (ref 3.87–5.11)
RDW: 14.6 % (ref 11.5–15.5)
WBC: 16.4 10*3/uL — ABNORMAL HIGH (ref 4.0–10.5)
nRBC: 0.1 % (ref 0.0–0.2)

## 2021-01-16 LAB — GLUCOSE, CAPILLARY
Glucose-Capillary: 211 mg/dL — ABNORMAL HIGH (ref 70–99)
Glucose-Capillary: 194 mg/dL — ABNORMAL HIGH (ref 70–99)
Glucose-Capillary: 227 mg/dL — ABNORMAL HIGH (ref 70–99)
Glucose-Capillary: 210 mg/dL — ABNORMAL HIGH (ref 70–99)

## 2021-01-16 MED ORDER — METHYLPREDNISOLONE SODIUM SUCC 40 MG IJ SOLR
20.0000 mg | Freq: Two times a day (BID) | INTRAMUSCULAR | Status: DC
  Administered 2021-01-16 – 2021-01-18 (×6): 20 mg via INTRAVENOUS
  Filled 2021-01-16 (×6): qty 1

## 2021-01-16 MED ORDER — HEPARIN (PORCINE) 25000 UT/250ML-% IV SOLN
1250.0000 [IU]/h | INTRAVENOUS | Status: DC
  Administered 2021-01-16: 1400 [IU]/h via INTRAVENOUS
  Administered 2021-01-17 – 2021-01-23 (×5): 1200 [IU]/h via INTRAVENOUS
  Administered 2021-01-24: 1250 [IU]/h via INTRAVENOUS
  Administered 2021-01-24: 1200 [IU]/h via INTRAVENOUS
  Administered 2021-01-25: 1250 [IU]/h via INTRAVENOUS
  Filled 2021-01-16 (×11): qty 250

## 2021-01-16 MED ORDER — SODIUM ZIRCONIUM CYCLOSILICATE 10 G PO PACK
10.0000 g | PACK | Freq: Two times a day (BID) | ORAL | Status: AC
  Administered 2021-01-16 (×2): 10 g via ORAL
  Filled 2021-01-16 (×2): qty 1

## 2021-01-16 MED ORDER — AZITHROMYCIN 500 MG PO TABS
500.0000 mg | ORAL_TABLET | Freq: Every day | ORAL | Status: AC
  Administered 2021-01-16 – 2021-01-20 (×5): 500 mg via ORAL
  Filled 2021-01-16 (×5): qty 1

## 2021-01-16 NOTE — Progress Notes (Signed)
Inpatient Diabetes Program Recommendations  AACE/ADA: New Consensus Statement on Inpatient Glycemic Control (2015)  Target Ranges:  Prepandial:   less than 140 mg/dL      Peak postprandial:   less than 180 mg/dL (1-2 hours)      Critically ill patients:  140 - 180 mg/dL   Lab Results  Component Value Date   GLUCAP 211 (H) 01/16/2021   HGBA1C 7.6 (H) 01/12/2021    Review of Glycemic Control Results for TAKELA, VARDEN (MRN [id number]) as of 01/16/2021 12:36  Ref. Range 01/15/2021 06:28 01/15/2021 11:48 01/15/2021 17:57 01/15/2021 21:47 01/16/2021 06:22  Glucose-Capillary Latest Ref Range: 70 - 99 mg/dL 211 (H) 202 (H) 181 (H) 190 (H) 211 (H)   Diabetes history: DM 2 Outpatient Diabetes medications:  Glucotrol 2.5 mg daily, Lantus 60 units daily Current orders for Inpatient glycemic control:  Novolog resistant tid with meals  Solumedrol 20 mg IV q 12 hours  Inpatient Diabetes Program Recommendations:    Consider reducing Novolog correction to moderate tid with meals and add Semglee 10 units daily.   Thanks,  Adah Perl, RN, BC-ADM Inpatient Diabetes Coordinator Pager [phone]  (8a-5p)

## 2021-01-16 NOTE — Progress Notes (Signed)
ANTICOAGULATION CONSULT NOTE - Follow Up Consult  Pharmacy Consult for IV Heparin Indication: pulmonary embolus  No Known Allergies  Patient Measurements: Height: 5\' 6"  (167.6 cm) Weight: 104.7 kg (230 lb 13.2 oz) IBW/kg (Calculated) : 59.3 Heparin Dosing Weight: 81.6 kg  Vital Signs: Temp: 98.5 F (36.9 C) (09/09 0456) Temp Source: Oral (09/09 0456) BP: 139/66 (09/09 0456) Pulse Rate: 100 (09/09 0456)  Labs: Recent Labs     0000 01/14/21 0113 01/14/21 0538 01/14/21 1043 01/14/21 2036 01/15/21 0144 01/15/21 0710 01/16/21 0204  HGB   < >  --  8.1*  --   --  8.0*  --  7.3*  HCT  --   --  25.6*  --   --  25.7*  --  23.5*  PLT  --   --  171  --   --  217  --  228  HEPARINUNFRC  --  0.25*  --    < > 0.62 0.53  --  0.68  CREATININE  --  5.71*  --   --   --   --  5.75* 5.39*   < > = values in this interval not displayed.    Estimated Creatinine Clearance: 11.7 mL/min (A) (by C-G formula based on SCr of 5.39 mg/dL (H)).   Assessment: 70 years of age female who arrested at [facility] with confirmed PE and received alteplase 100mg  on 9/04 @ 0300. Now >24 hours since Alteplase, continue heparin IV with goal heparin level between 0.3 to 0.7.   No bleeding or issues noted. Noted AKI - SCr finally decreasing from 5.75 > 5.39 -- still remains at risk for accumulation. Will monitor levels closely. Heparin levels therapeutic but at higher end of goal range. Per chart review, no s/sx bleeding. Will decrease slightly to prevent supratherapeutic heparin levels. Will check heparin level in 8 hours.  Goal of Therapy:  Heparin level 0.3-0.7 units/ml (>24hrs post Alteplase) Monitor platelets by anticoagulation protocol: Yes   Plan:  - Decrease IV Heparin to 1400 units/hr - Check heparin level in 8 hours - Check daily heparin level and CBC - Monitor daily CBC, s/sx bleeding   Thank you for allowing pharmacy to be a part of this patient's care.  Ardyth Harps, PharmD Clinical  Pharmacist

## 2021-01-16 NOTE — Progress Notes (Signed)
Pt family is at bedside requesting a work note.  MD not agreeable to this stating that family is not required to be at bedside for confusion, assist with moving, etc.  Family notified.  According to unit secretary, family was already informed no work note would be given yesterday.

## 2021-01-16 NOTE — Progress Notes (Addendum)
[facility] PROGRESS NOTE    Progress Note  Kathleen [name]  TMA:[id number] DOB: 12-06-1950 DOA: 01/10/2021 PCP: Fayrene Helper, MD     Brief Narrative:   Kathleen [name] is an 70 y.o. female past medical history of essential hypertension, diabetes mellitus type 2, chronic kidney disease stage IIIb, PE who was taken off Eliquis in July 2022 initially presented to [facility] 01/10/2021 with dyspnea and dizziness, was found to have recurrent PE with RV strain cardiac arrest with ROSC in about 30 minutes received thrombolytic therapy intubated and started on pressors and transferred to [facility]  Significant Events: 9/3 presented 9/4 Asystole arrest, TPA 100mg , intubated 9/5 off pressors, extubated  Significant studies: CT angio chest 9/04 >> b/l PE, RV:LV ratio 2.7 CT head 9/04 >> mild white matter disease Doppler legs 9/04 >> no evidence of DVT Echo 9/04 >> EF 55 to 60%, mod LVH, grade 1 DD, positive McConnell's sign, mod RV systolic dysfunction, moderate elevation in PASP, mod/severe TR   Assessment/Plan:   Acute respiratory failure with hypoxia and hypercarbia in the setting of acute pulmonary embolism with cor pulmonale and probably acute bronchitis She is currently requiring 1 to 2 L to keep saturations greater 92%. Continue IV heparin for now as creatinine remains elevated hopefully will start trending down and we can just transition her to oral Eliquis. She will need lifelong anticoagulation. Continue steroids, antibiotics and inhalers. Check a respiratory virus panel  Essential hypertension: Continue aspirin and Norvasc, holding Catapres lisinopril Aldactone, will resume as blood pressure starts to rise.  ATN in the setting of cardiogenic shock, lisinopril and IV contrast: With a baseline creatinine around 1.6 she had proteinuria due to diabetic nephropathy. No indication for HD at this point in time. Creatinine has plateaued hopefully it will start  improving. Further management per nephrology. For hyperkalemia we will start oral Lokelma.  Metabolic anion gap acidosis: Continue bicarbonate tablets.  Chest pain likely due to CPR:  Due to prolonged CPR Continue Tylenol.  Diabetes mellitus type 2/diabetic neuropathy: Continue to hold Neurontin in the setting of renal disease continue sliding scale.  History of GERD: Continue PPI.  History of thyroid nodule: Follow-up with PCP as an outpatient for possible biopsy if needed.   DVT prophylaxis: heparin Family Communication:none Status is: Inpatient  Remains inpatient appropriate because:Hemodynamically unstable  Dispo: The patient is from: Home              Anticipated d/c is to: SNF              Patient currently is not medically stable to d/c.   Difficult to place patient No   Code Status:     Code Status Orders  (From admission, onward)           Start     Ordered   01/11/21 0755  Full code  Continuous        01/11/21 0759           Code Status History     Date Active Date Inactive Code Status Order ID Comments User Context   10/23/2019 0627 10/28/2019 2020 Full Code [id number]  Roxan Hockey, MD ED         IV Access:   Peripheral IV   Procedures and diagnostic studies:   DG Chest Port 1V same Day  Result Date: 01/14/2021 CLINICAL DATA:  Sudden onset of shortness of breath today. EXAM: PORTABLE CHEST 1 VIEW COMPARISON:  01/12/2021 FINDINGS: Interval extubation. Continued  basilar improvement with residual streaky opacities at both lung bases. Stable heart size and mediastinal contours. No new consolidation. No pulmonary edema. No pneumothorax. No large volume pleural fluid. IMPRESSION: Interval extubation with continued basilar improvement in streaky opacities. No new abnormality. Electronically Signed   By: Keith Rake M.D.   On: 01/14/2021 18:14     Medical Consultants:   None.   Subjective:    Kathleen [name] relates her  breathing is better today.  Objective:    Vitals:   01/15/21 2023 01/15/21 2325 01/16/21 0456 01/16/21 0813  BP: 123/80 (!) 156/66 139/66 (!) 112/53  Pulse: 100 (!) 107 100 (!) 106  Resp: 20 19 20 20   Temp: 98.2 F (36.8 C) 98.3 F (36.8 C) 98.5 F (36.9 C) 98.6 F (37 C)  TempSrc: Oral Oral Oral Oral  SpO2: 98% 97% 99% 94%  Weight:      Height:       SpO2: 94 % O2 Flow Rate (L/min): 2 L/min FiO2 (%): 28 %   Intake/Output Summary (Last 24 hours) at 01/16/2021 0814 Last data filed at 01/16/2021 0600 Gross per 24 hour  Intake 580.74 ml  Output 1000 ml  Net -419.26 ml    Filed Weights   01/12/21 0549 01/13/21 0335 01/13/21 2111  Weight: 105 kg 105.6 kg 104.7 kg    Exam: General exam: In no acute distress. Respiratory system: Good air movement and clear to auscultation. Cardiovascular system: S1 & S2 heard, RRR. No JVD. Gastrointestinal system: Abdomen is nondistended, soft and nontender.  Extremities: No pedal edema. Skin: No rashes, lesions or ulcers  Data Reviewed:    Labs: Basic Metabolic Panel: Recent Labs  Lab 01/11/21 0655 01/11/21 0700 01/11/21 1300 01/12/21 0341 01/12/21 0557 01/13/21 0522 01/14/21 0113 01/15/21 0710 01/16/21 0204  NA 139   < > 139   < > 138 134* 133* 134* 133*  K 3.4*   < > 4.0   < > 4.6 4.9 5.5* 5.7* 5.5*  CL 101  --  102  --  102 103 103 100 97*  CO2 22  --  18*  --  23 19* 17* 23 23  GLUCOSE 449*  --  337*  --  81 176* 171* 203* 183*  BUN 34*  --  39*  --  45* 49* 52* 57* 70*  CREATININE 2.31*  --  2.86*  --  4.36* 5.43* 5.71* 5.75* 5.39*  CALCIUM 8.1*  --  8.4*  --  8.4* 7.8* 8.3* 8.7* 8.8*  MG 1.5*  --  1.9  --  1.5*  --   --   --   --   PHOS  --   --   --   --   --   --   --   --  4.9*   < > = values in this interval not displayed.    GFR Estimated Creatinine Clearance: 11.7 mL/min (A) (by C-G formula based on SCr of 5.39 mg/dL (H)). Liver Function Tests: Recent Labs  Lab 01/10/21 2316 01/11/21 0655  01/12/21 0557 01/16/21 0204  AST 22 203* 87*  --   ALT 26 203* 124*  --   ALKPHOS 99 114 80  --   BILITOT 0.4 0.9 0.4  --   PROT 8.0 5.9* 5.4*  --   ALBUMIN 4.1 2.7* 2.4* 2.7*    No results for input(s): LIPASE, AMYLASE in the last 168 hours. No results for input(s): AMMONIA in the last 168 hours. Coagulation  profile Recent Labs  Lab 01/11/21 0655  INR >10.0*    COVID-19 Labs  No results for input(s): DDIMER, FERRITIN, LDH, CRP in the last 72 hours.  Lab Results  Component Value Date   SARSCOV2NAA NEGATIVE 01/11/2021   Walker NEGATIVE 10/22/2019    CBC: Recent Labs  Lab 01/10/21 2316 01/11/21 0120 01/12/21 0557 01/13/21 0522 01/14/21 0538 01/15/21 0144 01/16/21 0204  WBC 9.2   < > 13.3* 11.9* 12.4* 12.7* 16.4*  NEUTROABS 6.3  --   --   --   --   --   --   HGB 11.3*   < > 10.4* 9.1* 8.1* 8.0* 7.3*  HCT 36.6   < > 32.1* 28.4* 25.6* 25.7* 23.5*  MCV 87.8   < > 81.9 84.5 84.8 86.2 85.8  PLT 219   < > 178 167 171 217 228   < > = values in this interval not displayed.    Cardiac Enzymes: No results for input(s): CKTOTAL, CKMB, CKMBINDEX, TROPONINI in the last 168 hours. BNP (last 3 results) No results for input(s): PROBNP in the last 8760 hours. CBG: Recent Labs  Lab 01/15/21 0628 01/15/21 1148 01/15/21 1757 01/15/21 2147 01/16/21 0622  GLUCAP 211* 202* 181* 190* 211*    D-Dimer: No results for input(s): DDIMER in the last 72 hours. Hgb A1c: No results for input(s): HGBA1C in the last 72 hours.  Lipid Profile: No results for input(s): CHOL, HDL, LDLCALC, TRIG, CHOLHDL, LDLDIRECT in the last 72 hours. Thyroid function studies: No results for input(s): TSH, T4TOTAL, T3FREE, THYROIDAB in the last 72 hours.  Invalid input(s): FREET3 Anemia work up: No results for input(s): VITAMINB12, FOLATE, FERRITIN, TIBC, IRON, RETICCTPCT in the last 72 hours. Sepsis Labs: Recent Labs  Lab 01/11/21 0655 01/12/21 0557 01/13/21 0522 01/14/21 0538  01/15/21 0144 01/16/21 0204  WBC 21.8*   < > 11.9* 12.4* 12.7* 16.4*  LATICACIDVEN 7.4*  --   --   --   --   --    < > = values in this interval not displayed.    Microbiology Recent Results (from the past 240 hour(s))  Resp Panel by RT-PCR (Flu A&B, Covid) Nasopharyngeal Swab     Status: None   Collection Time: 01/11/21  4:48 AM   Specimen: Nasopharyngeal Swab; Nasopharyngeal(NP) swabs in vial transport medium  Result Value Ref Range Status   SARS Coronavirus 2 by RT PCR NEGATIVE NEGATIVE Final    Comment: (NOTE) SARS-CoV-2 target nucleic acids are NOT DETECTED.  The SARS-CoV-2 RNA is generally detectable in upper respiratory specimens during the acute phase of infection. The lowest concentration of SARS-CoV-2 viral copies this assay can detect is 138 copies/mL. A negative result does not preclude SARS-Cov-2 infection and should not be used as the sole basis for treatment or other patient management decisions. A negative result may occur with  improper specimen collection/handling, submission of specimen other than nasopharyngeal swab, presence of viral mutation(s) within the areas targeted by this assay, and inadequate number of viral copies(<138 copies/mL). A negative result must be combined with clinical observations, patient history, and epidemiological information. The expected result is Negative.  Fact Sheet for Patients:  EntrepreneurPulse.com.au  Fact Sheet for Healthcare Providers:  IncredibleEmployment.be  This test is no t yet approved or cleared by the [place] FDA and  has been authorized for detection and/or diagnosis of SARS-CoV-2 by FDA under an Emergency Use Authorization (EUA). This EUA will remain  in effect (meaning this test can be  used) for the duration of the COVID-19 declaration under Section 564(b)(1) of the Act, 21 U.S.C.section 360bbb-3(b)(1), unless the authorization is terminated  or revoked sooner.        Influenza A by PCR NEGATIVE NEGATIVE Final   Influenza B by PCR NEGATIVE NEGATIVE Final    Comment: (NOTE) The Xpert Xpress SARS-CoV-2/FLU/RSV plus assay is intended as an aid in the diagnosis of influenza from Nasopharyngeal swab specimens and should not be used as a sole basis for treatment. Nasal washings and aspirates are unacceptable for Xpert Xpress SARS-CoV-2/FLU/RSV testing.  Fact Sheet for Patients: EntrepreneurPulse.com.au  Fact Sheet for Healthcare Providers: IncredibleEmployment.be  This test is not yet approved or cleared by the [place] FDA and has been authorized for detection and/or diagnosis of SARS-CoV-2 by FDA under an Emergency Use Authorization (EUA). This EUA will remain in effect (meaning this test can be used) for the duration of the COVID-19 declaration under Section 564(b)(1) of the Act, 21 U.S.C. section 360bbb-3(b)(1), unless the authorization is terminated or revoked.  Performed at [facility], [address], [place], [place] [address]   MRSA Next Gen by PCR, Nasal     Status: None   Collection Time: 01/11/21  6:03 AM   Specimen: Nasal Mucosa; Nasal Swab  Result Value Ref Range Status   MRSA by PCR Next Gen NOT DETECTED NOT DETECTED Final    Comment: (NOTE) The GeneXpert MRSA Assay (FDA approved for NASAL specimens only), is one component of a comprehensive MRSA colonization surveillance program. It is not intended to diagnose MRSA infection nor to guide or monitor treatment for MRSA infections. Test performance is not FDA approved in patients less than 69 years old. Performed at [facility], [place] [address] [place], [place] [address]      Medications:    amLODipine  10 mg Oral Daily   aspirin EC  81 mg Oral Daily   Chlorhexidine Gluconate Cloth  6 each Topical Daily   cholecalciferol  1,000 Units Oral Daily   insulin aspart  0-20 Units Subcutaneous TID WC   insulin aspart  0-5  Units Subcutaneous QHS   mouth rinse  15 mL Mouth Rinse BID   methylPREDNISolone (SOLU-MEDROL) injection  40 mg Intravenous Q12H   pantoprazole (PROTONIX) IV  40 mg Intravenous QHS   sodium bicarbonate  650 mg Oral BID   Continuous Infusions:  sodium chloride Stopped (01/11/21 0813)   heparin 1,450 Units/hr (01/16/21 0655)      LOS: 5 days   Charlynne Cousins  [facility]  01/16/2021, 8:14 AM

## 2021-01-16 NOTE — Progress Notes (Signed)
[facility] NEPHROLOGY PROGRESS NOTE  Assessment/ Plan: Pt is a 70 y.o. yo female  with a history of HTN, type 2 DM, HLD, obesity, CKD IIIb, and unprovoked PE in 2021 recently transitioned off eliquis who presented with dyspnea and dizziness, found to have bilateral recurrent PE with right ventricular strain. She had cardiac arrest with ROSC after about 30 min. She received thrombolytic therapy, was intubated and started on pressors. She was seen as a consult for AKI on CKD. Chronic renal disease thought to be due to diabetic nephropathy.    # AKI on CKD stage IIIb- likely ATN in the setting of cardiogenic shock, ACE inhibitor use and IV contrast. Spot urine PCR pending. Given one dose of lasix on 9/7, with good response. Creatine improved today 5.39. She continues to have good urinary output. Does not appear volume overloaded thus will not continue diuretics. No uremic symptoms or need for HD. Continue to trend BMP.   # Hyperkalemia- K 5.5, will continue lokelma BID.   # Anion gap metabolic acidosis- resolved, bicarb normalized. Continue sodium bicarbonate to 650 mg bid.    # Acute hypoxic/hypercapnic respiratory failure- in the setting of PE with acute cor pulmonale. S/p extubation on 9/5. On IV heparin and anticoagulation per primary. Continues to be short of breath, wheezing on exam improved. I do not suspect this is due to volume overload. May be in the setting of PE.   # Cardiac arrest/cardiogenic shock- s/p CPR and ROSC after 30 min, in the setting of PE and acute cor pulmonale.     Subjective:    Patient reports she continues to feel short of breath. Denies any other complaints.  Objective Vital signs in last 24 hours: Vitals:   01/15/21 1752 01/15/21 2023 01/15/21 2325 01/16/21 0456  BP: (!) 152/75 123/80 (!) 156/66 139/66  Pulse: 98 100 (!) 107 100  Resp: 20 20 19 20   Temp: 98.6 F (37 C) 98.2 F (36.8 C) 98.3 F (36.8 C) 98.5 F (36.9 C)  TempSrc: Oral Oral  Oral Oral  SpO2: 95% 98% 97% 99%  Weight:      Height:       Weight change:   Intake/Output Summary (Last 24 hours) at 01/16/2021 0751 Last data filed at 01/16/2021 0600 Gross per 24 hour  Intake 820.74 ml  Output 1000 ml  Net -179.26 ml       Labs: Basic Metabolic Panel: Recent Labs  Lab 01/14/21 0113 01/15/21 0710 01/16/21 0204  NA 133* 134* 133*  K 5.5* 5.7* 5.5*  CL 103 100 97*  CO2 17* 23 23  GLUCOSE 171* 203* 183*  BUN 52* 57* 70*  CREATININE 5.71* 5.75* 5.39*  CALCIUM 8.3* 8.7* 8.8*  PHOS  --   --  4.9*   Liver Function Tests: Recent Labs  Lab 01/10/21 2316 01/11/21 0655 01/12/21 0557 01/16/21 0204  AST 22 203* 87*  --   ALT 26 203* 124*  --   ALKPHOS 99 114 80  --   BILITOT 0.4 0.9 0.4  --   PROT 8.0 5.9* 5.4*  --   ALBUMIN 4.1 2.7* 2.4* 2.7*   No results for input(s): LIPASE, AMYLASE in the last 168 hours. No results for input(s): AMMONIA in the last 168 hours. CBC: Recent Labs  Lab 01/10/21 2316 01/11/21 0120 01/12/21 0557 01/13/21 0522 01/14/21 0538 01/15/21 0144 01/16/21 0204  WBC 9.2   < > 13.3* 11.9* 12.4* 12.7* 16.4*  NEUTROABS 6.3  --   --   --   --   --   --  HGB 11.3*   < > 10.4* 9.1* 8.1* 8.0* 7.3*  HCT 36.6   < > 32.1* 28.4* 25.6* 25.7* 23.5*  MCV 87.8   < > 81.9 84.5 84.8 86.2 85.8  PLT 219   < > 178 167 171 217 228   < > = values in this interval not displayed.   Cardiac Enzymes: No results for input(s): CKTOTAL, CKMB, CKMBINDEX, TROPONINI in the last 168 hours. CBG: Recent Labs  Lab 01/15/21 0628 01/15/21 1148 01/15/21 1757 01/15/21 2147 01/16/21 0622  GLUCAP 211* 202* 181* 190* 211*    Iron Studies: No results for input(s): IRON, TIBC, TRANSFERRIN, FERRITIN in the last 72 hours. Studies/Results: DG Chest Port 1V same Day  Result Date: 01/14/2021 CLINICAL DATA:  Sudden onset of shortness of breath today. EXAM: PORTABLE CHEST 1 VIEW COMPARISON:  01/12/2021 FINDINGS: Interval extubation. Continued basilar  improvement with residual streaky opacities at both lung bases. Stable heart size and mediastinal contours. No new consolidation. No pulmonary edema. No pneumothorax. No large volume pleural fluid. IMPRESSION: Interval extubation with continued basilar improvement in streaky opacities. No new abnormality. Electronically Signed   By: Keith Rake M.D.   On: 01/14/2021 18:14    Medications: Infusions:  sodium chloride Stopped (01/11/21 0813)   heparin 1,450 Units/hr (01/16/21 0655)    Scheduled Medications:  amLODipine  10 mg Oral Daily   aspirin EC  81 mg Oral Daily   Chlorhexidine Gluconate Cloth  6 each Topical Daily   cholecalciferol  1,000 Units Oral Daily   insulin aspart  0-20 Units Subcutaneous TID WC   insulin aspart  0-5 Units Subcutaneous QHS   mouth rinse  15 mL Mouth Rinse BID   methylPREDNISolone (SOLU-MEDROL) injection  40 mg Intravenous Q12H   pantoprazole (PROTONIX) IV  40 mg Intravenous QHS   sodium bicarbonate  650 mg Oral BID    have reviewed scheduled and prn medications.  Physical Exam: General:NAD, comfortable Heart:RRR, s1s2 nl Lungs:bilateral wheezing, on 2L [place] Abdomen:soft, Non-tender, non-distended Extremities:Trace nonpitting edema in b/l LE, right >left  Dialysis Access: n/a   Jarmal Lewelling N Christphor Groft 01/16/2021,7:51 AM  LOS: 5 days

## 2021-01-17 DIAGNOSIS — I2699 Other pulmonary embolism without acute cor pulmonale: Secondary | ICD-10-CM | POA: Diagnosis not present

## 2021-01-17 DIAGNOSIS — R579 Shock, unspecified: Secondary | ICD-10-CM | POA: Diagnosis not present

## 2021-01-17 DIAGNOSIS — N179 Acute kidney failure, unspecified: Secondary | ICD-10-CM | POA: Diagnosis not present

## 2021-01-17 DIAGNOSIS — I469 Cardiac arrest, cause unspecified: Secondary | ICD-10-CM | POA: Diagnosis not present

## 2021-01-17 LAB — RENAL FUNCTION PANEL
Albumin: 2.5 g/dL — ABNORMAL LOW (ref 3.5–5.0)
Anion gap: 8 (ref 5–15)
BUN: 74 mg/dL — ABNORMAL HIGH (ref 8–23)
CO2: 26 mmol/L (ref 22–32)
Calcium: 8.7 mg/dL — ABNORMAL LOW (ref 8.9–10.3)
Chloride: 100 mmol/L (ref 98–111)
Creatinine, Ser: 4.81 mg/dL — ABNORMAL HIGH (ref 0.44–1.00)
GFR, Estimated: 9 mL/min — ABNORMAL LOW (ref >60)
Glucose, Bld: 206 mg/dL — ABNORMAL HIGH (ref 70–99)
Phosphorus: 4 mg/dL (ref 2.5–4.6)
Potassium: 5.2 mmol/L — ABNORMAL HIGH (ref 3.5–5.1)
Sodium: 134 mmol/L — ABNORMAL LOW (ref 135–145)

## 2021-01-17 LAB — CBC
HCT: 22.7 % — ABNORMAL LOW (ref 36.0–46.0)
Hemoglobin: 7.3 g/dL — ABNORMAL LOW (ref 12.0–15.0)
MCH: 27.2 pg (ref 26.0–34.0)
MCHC: 32.2 g/dL (ref 30.0–36.0)
MCV: 84.7 fL (ref 80.0–100.0)
Platelets: 241 10*3/uL (ref 150–400)
RBC: 2.68 MIL/uL — ABNORMAL LOW (ref 3.87–5.11)
RDW: 14.2 % (ref 11.5–15.5)
WBC: 15.7 10*3/uL — ABNORMAL HIGH (ref 4.0–10.5)
nRBC: 0.1 % (ref 0.0–0.2)

## 2021-01-17 LAB — GLUCOSE, CAPILLARY
Glucose-Capillary: 205 mg/dL — ABNORMAL HIGH (ref 70–99)
Glucose-Capillary: 244 mg/dL — ABNORMAL HIGH (ref 70–99)
Glucose-Capillary: 236 mg/dL — ABNORMAL HIGH (ref 70–99)
Glucose-Capillary: 170 mg/dL — ABNORMAL HIGH (ref 70–99)

## 2021-01-17 LAB — HEPARIN LEVEL (UNFRACTIONATED)
Heparin Unfractionated: 0.93 IU/mL — ABNORMAL HIGH (ref 0.30–0.70)
Heparin Unfractionated: 0.57 IU/mL (ref 0.30–0.70)

## 2021-01-17 MED ORDER — CARVEDILOL 3.125 MG PO TABS
3.1250 mg | ORAL_TABLET | Freq: Two times a day (BID) | ORAL | Status: DC
  Administered 2021-01-17 – 2021-01-18 (×2): 3.125 mg via ORAL
  Filled 2021-01-17 (×2): qty 1

## 2021-01-17 MED ORDER — SODIUM ZIRCONIUM CYCLOSILICATE 10 G PO PACK
10.0000 g | PACK | Freq: Two times a day (BID) | ORAL | Status: AC
  Administered 2021-01-17 (×2): 10 g via ORAL
  Filled 2021-01-17 (×2): qty 1

## 2021-01-17 NOTE — Progress Notes (Signed)
[facility] PROGRESS NOTE    Progress Note  Kathleen [name]  BVQ:[id number] DOB: [birth year] DOA: 01/10/2021 PCP: Fayrene Helper, MD     Brief Narrative:   Kathleen [name] is an 70 y.o. female past medical history of essential hypertension, diabetes mellitus type 2, chronic kidney disease stage IIIb, PE who was taken off Eliquis in July 2022 initially presented to [facility] 01/10/2021 with dyspnea and dizziness, was found to have recurrent PE with RV strain cardiac arrest with ROSC in about 30 minutes received thrombolytic therapy intubated and started on pressors and transferred to [facility]  Significant Events: 9/3 presented 9/4 Asystole arrest,  intubated 9/5 off pressors, extubated  Significant studies: CT angio chest 9/04 >> b/l PE, RV:LV ratio 2.7 CT head 9/04 >> mild white matter disease Doppler legs 9/04 >> no evidence of DVT Echo 9/04 >> EF 55 to 60%, mod LVH, grade 1 DD, positive McConnell's sign, mod RV systolic dysfunction, moderate elevation in PASP, mod/severe TR   Assessment/Plan:   Acute respiratory failure with hypoxia and hypercarbia in the setting of acute pulmonary embolism with cor pulmonale and probably acute bronchitis Still requiring 2 L of oxygen. Continue IV heparin, is slowly improving. Will be transition to oral Eliquis once her creatinine continues to improve. She will need lifelong anticoagulation. Continue steroids, antibiotics and inhalers. Respiratory panel is negative.  Essential hypertension: Blood pressure is stable continue Norvasc, holding Catapres lisinopril Aldactone, will resume as blood pressure starts to rise and creatinine start to improve.  ATN in the setting of cardiogenic shock, lisinopril and IV contrast: With a baseline creatinine around 1.6 she had proteinuria due to diabetic nephropathy. Creatinine is slowly improving with conservative management. Continue to monitor electrolytes and treat as needed. Her  potassium is today 5.2 after Lokelma. Appreciate nephrology's assistance.  Metabolic anion gap acidosis: Continue bicarbonate tablets.  Chest pain likely due to CPR:  Due to prolonged CPR Continue Tylenol.  Diabetes mellitus type 2/diabetic neuropathy: Continue to hold Neurontin in the setting of renal disease continue sliding scale.  History of GERD: Continue PPI.  History of thyroid nodule: Follow-up with PCP as an outpatient for possible biopsy if needed.   DVT prophylaxis: heparin Family Communication:none Status is: Inpatient  Remains inpatient appropriate because:Hemodynamically unstable  Dispo: The patient is from: Home              Anticipated d/c is to: SNF              Patient currently is not medically stable to d/c.   Difficult to place patient No   Code Status:     Code Status Orders  (From admission, onward)           Start     Ordered   01/11/21 0755  Full code  Continuous        01/11/21 0759           Code Status History     Date Active Date Inactive Code Status Order ID Comments User Context   10/23/2019 0627 10/28/2019 2020 Full Code [id number]  Roxan Hockey, MD ED         IV Access:   Peripheral IV   Procedures and diagnostic studies:   No results found.   Medical Consultants:   None.   Subjective:    Kathleen [name] relates her breathing is better.  Objective:    Vitals:   01/16/21 2350 01/17/21 0417 01/17/21 0441 01/17/21 0735  BP: Marland Kitchen)  151/69 (!) 161/70  (!) 172/70  Pulse: 100 100  (!) 101  Resp: 20 16  20   Temp: 98.7 F (37.1 C) 98.7 F (37.1 C)  97.8 F (36.6 C)  TempSrc: Oral Oral  Oral  SpO2: 96% 98%  98%  Weight:   104.7 kg   Height:       SpO2: 98 % O2 Flow Rate (L/min): 2 L/min FiO2 (%): 28 %   Intake/Output Summary (Last 24 hours) at 01/17/2021 0839 Last data filed at 01/17/2021 0419 Gross per 24 hour  Intake 479.54 ml  Output 2250 ml  Net -1770.46 ml    Filed Weights    01/13/21 0335 01/13/21 2111 01/17/21 0441  Weight: 105.6 kg 104.7 kg 104.7 kg    Exam: General exam: In no acute distress. Respiratory system: Good air movement and clear to auscultation. Cardiovascular system: S1 & S2 heard, RRR. No JVD. Gastrointestinal system: Abdomen is nondistended, soft and nontender.  Extremities: No pedal edema. Skin: No rashes, lesions or ulcers  Data Reviewed:    Labs: Basic Metabolic Panel: Recent Labs  Lab 01/11/21 0655 01/11/21 0700 01/11/21 1300 01/12/21 0341 01/12/21 0557 01/13/21 0522 01/14/21 0113 01/15/21 0710 01/16/21 0204 01/17/21 0106  NA 139   < > 139   < > 138 134* 133* 134* 133* 134*  K 3.4*   < > 4.0   < > 4.6 4.9 5.5* 5.7* 5.5* 5.2*  CL 101  --  102  --  102 103 103 100 97* 100  CO2 22  --  18*  --  23 19* 17* 23 23 26   GLUCOSE 449*  --  337*  --  81 176* 171* 203* 183* 206*  BUN 34*  --  39*  --  45* 49* 52* 57* 70* 74*  CREATININE 2.31*  --  2.86*  --  4.36* 5.43* 5.71* 5.75* 5.39* 4.81*  CALCIUM 8.1*  --  8.4*  --  8.4* 7.8* 8.3* 8.7* 8.8* 8.7*  MG 1.5*  --  1.9  --  1.5*  --   --   --   --   --   PHOS  --   --   --   --   --   --   --   --  4.9* 4.0   < > = values in this interval not displayed.    GFR Estimated Creatinine Clearance: 13.1 mL/min (A) (by C-G formula based on SCr of 4.81 mg/dL (H)). Liver Function Tests: Recent Labs  Lab 01/10/21 2316 01/11/21 0655 01/12/21 0557 01/16/21 0204 01/17/21 0106  AST 22 203* 87*  --   --   ALT 26 203* 124*  --   --   ALKPHOS 99 114 80  --   --   BILITOT 0.4 0.9 0.4  --   --   PROT 8.0 5.9* 5.4*  --   --   ALBUMIN 4.1 2.7* 2.4* 2.7* 2.5*    No results for input(s): LIPASE, AMYLASE in the last 168 hours. No results for input(s): AMMONIA in the last 168 hours. Coagulation profile Recent Labs  Lab 01/11/21 0655  INR >10.0*    COVID-19 Labs  No results for input(s): DDIMER, FERRITIN, LDH, CRP in the last 72 hours.  Lab Results  Component Value Date    SARSCOV2NAA NEGATIVE 01/11/2021   [place] NEGATIVE 10/22/2019    CBC: Recent Labs  Lab 01/10/21 2316 01/11/21 0120 01/13/21 0522 01/14/21 3419 01/15/21 0144 01/16/21 3790 01/17/21 0106  WBC 9.2   < > 11.9* 12.4* 12.7* 16.4* 15.7*  NEUTROABS 6.3  --   --   --   --   --   --   HGB 11.3*   < > 9.1* 8.1* 8.0* 7.3* 7.3*  HCT 36.6   < > 28.4* 25.6* 25.7* 23.5* 22.7*  MCV 87.8   < > 84.5 84.8 86.2 85.8 84.7  PLT 219   < > 167 171 217 228 241   < > = values in this interval not displayed.    Cardiac Enzymes: No results for input(s): CKTOTAL, CKMB, CKMBINDEX, TROPONINI in the last 168 hours. BNP (last 3 results) No results for input(s): PROBNP in the last 8760 hours. CBG: Recent Labs  Lab 01/16/21 0622 01/16/21 1232 01/16/21 1619 01/16/21 2037 01/17/21 0555  GLUCAP 211* 194* 227* 210* 205*    D-Dimer: No results for input(s): DDIMER in the last 72 hours. Hgb A1c: No results for input(s): HGBA1C in the last 72 hours.  Lipid Profile: No results for input(s): CHOL, HDL, LDLCALC, TRIG, CHOLHDL, LDLDIRECT in the last 72 hours. Thyroid function studies: No results for input(s): TSH, T4TOTAL, T3FREE, THYROIDAB in the last 72 hours.  Invalid input(s): FREET3 Anemia work up: No results for input(s): VITAMINB12, FOLATE, FERRITIN, TIBC, IRON, RETICCTPCT in the last 72 hours. Sepsis Labs: Recent Labs  Lab 01/11/21 0655 01/12/21 0557 01/14/21 0538 01/15/21 0144 01/16/21 0204 01/17/21 0106  WBC 21.8*   < > 12.4* 12.7* 16.4* 15.7*  LATICACIDVEN 7.4*  --   --   --   --   --    < > = values in this interval not displayed.    Microbiology Recent Results (from the past 240 hour(s))  Resp Panel by RT-PCR (Flu A&B, Covid) Nasopharyngeal Swab     Status: None   Collection Time: 01/11/21  4:48 AM   Specimen: Nasopharyngeal Swab; Nasopharyngeal(NP) swabs in vial transport medium  Result Value Ref Range Status   SARS Coronavirus 2 by RT PCR NEGATIVE NEGATIVE Final     Comment: (NOTE) SARS-CoV-2 target nucleic acids are NOT DETECTED.  The SARS-CoV-2 RNA is generally detectable in upper respiratory specimens during the acute phase of infection. The lowest concentration of SARS-CoV-2 viral copies this assay can detect is 138 copies/mL. A negative result does not preclude SARS-Cov-2 infection and should not be used as the sole basis for treatment or other patient management decisions. A negative result may occur with  improper specimen collection/handling, submission of specimen other than nasopharyngeal swab, presence of viral mutation(s) within the areas targeted by this assay, and inadequate number of viral copies(<138 copies/mL). A negative result must be combined with clinical observations, patient history, and epidemiological information. The expected result is Negative.  Fact Sheet for Patients:  EntrepreneurPulse.com.au  Fact Sheet for Healthcare Providers:  IncredibleEmployment.be  This test is no t yet approved or cleared by the [place] FDA and  has been authorized for detection and/or diagnosis of SARS-CoV-2 by FDA under an Emergency Use Authorization (EUA). This EUA will remain  in effect (meaning this test can be used) for the duration of the COVID-19 declaration under Section 564(b)(1) of the Act, 21 U.S.C.section 360bbb-3(b)(1), unless the authorization is terminated  or revoked sooner.       Influenza A by PCR NEGATIVE NEGATIVE Final   Influenza B by PCR NEGATIVE NEGATIVE Final    Comment: (NOTE) The Xpert Xpress SARS-CoV-2/FLU/RSV plus assay is intended as an aid in the diagnosis of influenza  from Nasopharyngeal swab specimens and should not be used as a sole basis for treatment. Nasal washings and aspirates are unacceptable for Xpert Xpress SARS-CoV-2/FLU/RSV testing.  Fact Sheet for Patients: EntrepreneurPulse.com.au  Fact Sheet for Healthcare  Providers: IncredibleEmployment.be  This test is not yet approved or cleared by the [place] FDA and has been authorized for detection and/or diagnosis of SARS-CoV-2 by FDA under an Emergency Use Authorization (EUA). This EUA will remain in effect (meaning this test can be used) for the duration of the COVID-19 declaration under Section 564(b)(1) of the Act, 21 U.S.C. section 360bbb-3(b)(1), unless the authorization is terminated or revoked.  Performed at [facility], [address], [place], [place] [address]   MRSA Next Gen by PCR, Nasal     Status: None   Collection Time: 01/11/21  6:03 AM   Specimen: Nasal Mucosa; Nasal Swab  Result Value Ref Range Status   MRSA by PCR Next Gen NOT DETECTED NOT DETECTED Final    Comment: (NOTE) The GeneXpert MRSA Assay (FDA approved for NASAL specimens only), is one component of a comprehensive MRSA colonization surveillance program. It is not intended to diagnose MRSA infection nor to guide or monitor treatment for MRSA infections. Test performance is not FDA approved in patients less than 24 years old. Performed at [facility], [place] [address] [place], [place] [address]   Respiratory (~20 pathogens) panel by PCR     Status: None   Collection Time: 01/16/21  1:19 PM   Specimen: Nasopharyngeal Swab; Respiratory  Result Value Ref Range Status   Adenovirus NOT DETECTED NOT DETECTED Final   Coronavirus 229E NOT DETECTED NOT DETECTED Final    Comment: (NOTE) The Coronavirus on the Respiratory Panel, DOES NOT test for the novel  Coronavirus (2019 nCoV)    Coronavirus HKU1 NOT DETECTED NOT DETECTED Final   Coronavirus NL63 NOT DETECTED NOT DETECTED Final   Coronavirus OC43 NOT DETECTED NOT DETECTED Final   Metapneumovirus NOT DETECTED NOT DETECTED Final   Rhinovirus / Enterovirus NOT DETECTED NOT DETECTED Final   Influenza A NOT DETECTED NOT DETECTED Final   Influenza B NOT DETECTED NOT DETECTED Final    Parainfluenza Virus 1 NOT DETECTED NOT DETECTED Final   Parainfluenza Virus 2 NOT DETECTED NOT DETECTED Final   Parainfluenza Virus 3 NOT DETECTED NOT DETECTED Final   Parainfluenza Virus 4 NOT DETECTED NOT DETECTED Final   Respiratory Syncytial Virus NOT DETECTED NOT DETECTED Final   Bordetella pertussis NOT DETECTED NOT DETECTED Final   Bordetella Parapertussis NOT DETECTED NOT DETECTED Final   Chlamydophila pneumoniae NOT DETECTED NOT DETECTED Final   Mycoplasma pneumoniae NOT DETECTED NOT DETECTED Final    Comment: Performed at [facility], [place]. [address], [place], [place] [address]     Medications:    amLODipine  10 mg Oral Daily   aspirin EC  81 mg Oral Daily   azithromycin  500 mg Oral Daily   Chlorhexidine Gluconate Cloth  6 each Topical Daily   cholecalciferol  1,000 Units Oral Daily   insulin aspart  0-20 Units Subcutaneous TID WC   insulin aspart  0-5 Units Subcutaneous QHS   mouth rinse  15 mL Mouth Rinse BID   methylPREDNISolone (SOLU-MEDROL) injection  20 mg Intravenous Q12H   pantoprazole (PROTONIX) IV  40 mg Intravenous QHS   sodium bicarbonate  650 mg Oral BID   Continuous Infusions:  sodium chloride Stopped (01/11/21 0813)   heparin 1,200 Units/hr (01/17/21 0205)  LOS: 6 days   Charlynne Cousins  [facility]  01/17/2021, 8:39 AM

## 2021-01-17 NOTE — Progress Notes (Signed)
ANTICOAGULATION CONSULT NOTE - Follow Up Consult  Pharmacy Consult for IV Heparin Indication: pulmonary embolus  No Known Allergies  Patient Measurements: Height: 5\' 6"  (167.6 cm) Weight: 104.7 kg (230 lb 13.2 oz) IBW/kg (Calculated) : 59.3 Heparin Dosing Weight: 81.6 kg  Vital Signs: Temp: 98.7 F (37.1 C) (09/09 2350) Temp Source: Oral (09/09 2350) BP: 151/69 (09/09 2350) Pulse Rate: 100 (09/09 2350)  Labs: Recent Labs    01/15/21 0144 01/15/21 0710 01/16/21 0204 01/16/21 1606 01/17/21 0106  HGB 8.0*  --  7.3*  --  7.3*  HCT 25.7*  --  23.5*  --  22.7*  PLT 217  --  228  --  241  HEPARINUNFRC 0.53  --  0.68 0.71* 0.93*  CREATININE  --  5.75* 5.39*  --   --      Estimated Creatinine Clearance: 11.7 mL/min (A) (by C-G formula based on SCr of 5.39 mg/dL (H)).   Assessment: 70 years of age female who arrested at [facility] with confirmed PE and received alteplase 100mg  on 9/04 @ 0300. Now >24 hours since Alteplase, continue heparin IV with goal heparin level between 0.3 to 0.7.   Heparin level this morning is above goal at 0.93 on 1400 units/hr. No bleeding issues noted.   Goal of Therapy:  Heparin level 0.3-0.7 units/ml (>24hrs post Alteplase) Monitor platelets by anticoagulation protocol: Yes   Plan:  - Decrease IV Heparin to 1200 units/hr - Check heparin level in 8 hours - Check daily heparin level and CBC - Monitor daily CBC, s/sx bleeding   Thank you for allowing pharmacy to be a part of this patient's care.  Erin Hearing PharmD., BCPS Clinical Pharmacist 01/17/2021 2:01 AM

## 2021-01-17 NOTE — Progress Notes (Signed)
Inpatient Rehab Admissions Coordinator Note:   Per PT patient was screened for CIR candidacy by Kierria Feigenbaum Danford Bad, CCC-SLP. At this time, pt appears to be a potential candidate for CIR. I will place an order for rehab consult for full assessment, per our protocol.  Please contact me any with questions.Gayland Curry, [place], [place] Admissions Coordinator [phone] 01/17/21 2:05 PM

## 2021-01-17 NOTE — Progress Notes (Addendum)
Inpatient Rehab Admissions:  Inpatient Rehab Consult received.  I met with patient at the bedside for rehabilitation assessment and to discuss goals and expectations of an inpatient rehab admission.  Pt acknowledged understanding of CIR goals and expectations. Pt interested in pursuing CIR. Pt gave permission to contact son Ellin Mayhew) [place]. Left message; awaiting return call. Will continue to follow.  ADDENDUM: Spoke with Juliette Alcide, [place], and [place].  Explained CIR goals and expectations to all of them.  They all acknowledged understanding. They are all supportive of pt pursuing CIR.  Pt's son, Lennette Bihari also suggested I call Lasharon Dunivan, pt's daughter-in-law. Left a message; awaiting return call.  Will continue to follow.     Signed: Gayland Curry, [place], [place] Admissions Coordinator [phone]

## 2021-01-17 NOTE — Plan of Care (Signed)
  Problem: Education: Goal: Knowledge of General Education information will improve Description: Including pain rating scale, medication(s)/side effects and non-pharmacologic comfort measures Outcome: Progressing   Problem: Activity: Goal: Risk for activity intolerance will decrease Outcome: Progressing   Problem: Clinical Measurements: Goal: Respiratory complications will improve Outcome: Progressing   Problem: Clinical Measurements: Goal: Respiratory complications will improve Outcome: Progressing   Problem: Nutrition: Goal: Adequate nutrition will be maintained Outcome: Progressing   Problem: Coping: Goal: Level of anxiety will decrease Outcome: Progressing

## 2021-01-17 NOTE — Progress Notes (Addendum)
Kathleen [name]  Assessment/ Plan: Pt is a 70 y.o. yo female  with a history of HTN, type 2 DM, HLD, obesity, CKD IIIb, and unprovoked PE in 2021 recently transitioned off eliquis who presented with dyspnea and dizziness, found to have bilateral recurrent PE with right ventricular strain. She had cardiac arrest with ROSC after about 30 min. She received thrombolytic therapy, was intubated and started on pressors. She was seen as a consult for AKI on CKD. Chronic renal disease thought to be due to diabetic nephropathy.    # AKI on CKD stage IIIb- likely ATN in the setting of cardiogenic shock, ACE inhibitor use and IV contrast. Spot urine PCR pending. Given one dose of lasix on 9/7, with good response. Continues to have good urine output, ~2.25L in the past 24 hours. Creatine continues to downtrend, 4.8. Appears euvolemic, no need for diuretics. No uremic symptoms or need for HD. Continue to trend BMP.   # Hyperkalemia- Improving. K 5.2, will continue lokelma BID for one more day.   # Anion gap metabolic acidosis- resolved, bicarb normalized. Continue sodium bicarbonate to 650 mg bid.    # Acute hypoxic/hypercapnic respiratory failure- in the setting of PE with acute cor pulmonale. S/p extubation on 9/5. On IV heparin and anticoagulation per primary. Continues to be short of breath, wheezing on exam improved. I do not suspect this is due to volume overload. May be in the setting of PE.   # Cardiac arrest/cardiogenic shock- s/p CPR and ROSC after 30 min, in the setting of PE and acute cor pulmonale.     Subjective:    Patient reports improvement in her breathing, does not feel SOB this morning. Denies any other complaints.  Objective Vital signs in last 24 hours: Vitals:   01/16/21 2350 01/17/21 0417 01/17/21 0441 01/17/21 0735  BP: (!) 151/69 (!) 161/70  (!) 172/70  Pulse: 100 100  (!) 101  Resp: 20 16  20   Temp: 98.7 F (37.1 C) 98.7 F (37.1 C)   97.8 F (36.6 C)  TempSrc: Oral Oral  Oral  SpO2: 96% 98%  98%  Weight:   104.7 kg   Height:       Weight change:   Intake/Output Summary (Last 24 hours) at 01/17/2021 0837 Last data filed at 01/17/2021 0419 Gross per 24 hour  Intake 479.54 ml  Output 2250 ml  Net -1770.46 ml       Labs: Basic Metabolic Panel: Recent Labs  Lab 01/15/21 0710 01/16/21 0204 01/17/21 0106  NA 134* 133* 134*  K 5.7* 5.5* 5.2*  CL 100 97* 100  CO2 23 23 26   GLUCOSE 203* 183* 206*  BUN 57* 70* 74*  CREATININE 5.75* 5.39* 4.81*  CALCIUM 8.7* 8.8* 8.7*  PHOS  --  4.9* 4.0   Liver Function Tests: Recent Labs  Lab 01/10/21 2316 01/11/21 0655 01/12/21 0557 01/16/21 0204 01/17/21 0106  AST 22 203* 87*  --   --   ALT 26 203* 124*  --   --   ALKPHOS 99 114 80  --   --   BILITOT 0.4 0.9 0.4  --   --   PROT 8.0 5.9* 5.4*  --   --   ALBUMIN 4.1 2.7* 2.4* 2.7* 2.5*   No results for input(s): LIPASE, AMYLASE in the last 168 hours. No results for input(s): AMMONIA in the last 168 hours. CBC: Recent Labs  Lab 01/10/21 2316 01/11/21 0120 01/13/21 0522 01/14/21 7654  01/15/21 0144 01/16/21 0204 01/17/21 0106  WBC 9.2   < > 11.9* 12.4* 12.7* 16.4* 15.7*  NEUTROABS 6.3  --   --   --   --   --   --   HGB 11.3*   < > 9.1* 8.1* 8.0* 7.3* 7.3*  HCT 36.6   < > 28.4* 25.6* 25.7* 23.5* 22.7*  MCV 87.8   < > 84.5 84.8 86.2 85.8 84.7  PLT 219   < > 167 171 217 228 241   < > = values in this interval not displayed.   Cardiac Enzymes: No results for input(s): CKTOTAL, CKMB, CKMBINDEX, TROPONINI in the last 168 hours. CBG: Recent Labs  Lab 01/16/21 0622 01/16/21 1232 01/16/21 1619 01/16/21 2037 01/17/21 0555  GLUCAP 211* 194* 227* 210* 205*    Iron Studies: No results for input(s): IRON, TIBC, TRANSFERRIN, FERRITIN in the last 72 hours. Studies/Results: No results found.  Medications: Infusions:  sodium chloride Stopped (01/11/21 0813)   heparin 1,200 Units/hr (01/17/21 0205)     Scheduled Medications:  amLODipine  10 mg Oral Daily   aspirin EC  81 mg Oral Daily   azithromycin  500 mg Oral Daily   Chlorhexidine Gluconate Cloth  6 each Topical Daily   cholecalciferol  1,000 Units Oral Daily   insulin aspart  0-20 Units Subcutaneous TID WC   insulin aspart  0-5 Units Subcutaneous QHS   mouth rinse  15 mL Mouth Rinse BID   methylPREDNISolone (SOLU-MEDROL) injection  20 mg Intravenous Q12H   pantoprazole (PROTONIX) IV  40 mg Intravenous QHS   sodium bicarbonate  650 mg Oral BID    have reviewed scheduled and prn medications.  Physical Exam: General:NAD, comfortable Heart:RRR, s1s2 nl Lungs:CTA bilaterally, on 2L [place] Abdomen:soft, Non-tender, non-distended Extremities:Trace nonpitting edema in b/l LE, right >left  Dialysis Access: n/a   Kathleen [name] 01/17/2021,8:37 AM  LOS: 6 days

## 2021-01-17 NOTE — Plan of Care (Signed)
  Problem: Clinical Measurements: Goal: Will remain free from infection Outcome: Progressing   Problem: Health Behavior/Discharge Planning: Goal: Ability to manage health-related needs will improve Outcome: Not Progressing   Problem: Clinical Measurements: Goal: Ability to maintain clinical measurements within normal limits will improve Outcome: Not Progressing

## 2021-01-17 NOTE — Progress Notes (Signed)
ANTICOAGULATION CONSULT NOTE - Follow Up Consult  Pharmacy Consult for heparin Indication: pulmonary embolus  No Known Allergies  Patient Measurements: Height: 5\' 6"  (167.6 cm) Weight: 104.7 kg (230 lb 13.2 oz) IBW/kg (Calculated) : 59.3 Heparin Dosing Weight: 81.6 kg  Vital Signs: Temp: 98.6 F (37 C) (09/10 1133) Temp Source: Oral (09/10 1133) BP: 159/75 (09/10 1133) Pulse Rate: 101 (09/10 1133)  Labs: Recent Labs    01/15/21 0144 01/15/21 0710 01/16/21 0204 01/16/21 1606 01/17/21 0106 01/17/21 1109  HGB 8.0*  --  7.3*  --  7.3*  --   HCT 25.7*  --  23.5*  --  22.7*  --   PLT 217  --  228  --  241  --   HEPARINUNFRC 0.53  --  0.68 0.71* 0.93* 0.57  CREATININE  --  5.75* 5.39*  --  4.81*  --     Estimated Creatinine Clearance: 13.1 mL/min (A) (by C-G formula based on SCr of 4.81 mg/dL (H)).  Assessment: 70 years of age female who arrested at [facility] with confirmed PE and received alteplase 100mg  on 9/04 @ 0300. Now >24 hours since Alteplase, continue heparin IV with goal heparin level between 0.3 to 0.7. Last heparin level was elevated at 0.93, so infusion was decreased to heparin 1200 units/hr.   Most recent level within goal 0.57. CBC stable. Patient has been having some mild occasional hemoptysis. MD aware. Will continue heparin with close monitoring due to active pulmonary embolus.  Goal of Therapy:  Heparin level 0.3-0.7 units/ml Monitor platelets by anticoagulation protocol: Yes   Plan:  Heparin 1200 units/hr  Check daily heparin level and CBC Monitor daily CBC, s/sx of bleeding  Thank you for including pharmacy in the care of this patient.  Zenaida Deed, PharmD PGY1 Acute Care Pharmacy Resident  Phone: [phone] 01/17/2021  12:47 PM  Please check [email or web address] for unit-specific pharmacy phone numbers.

## 2021-01-17 NOTE — Evaluation (Signed)
Physical Therapy Evaluation Patient Details Name: Kathleen [name] MRN: [id number] DOB: 05/26/1950 Today's Date: 01/17/2021   History of Present Illness  70 yo female presents on 9/3 with dizziness, SOB. While in CT, pt went into cardiac arrest x3 and received CPR 30 min with ROSC, ETT 9/3-9/5. CTA shows CTA bilateral pulmonary emboli with RV strain, on heparin. PMH includes hypertension, diabetes mellitus type 2, chronic kidney disease stage IIIb, PE.  Clinical Impression   Pt presents with debility, chest pain due to CPR and bilat foot pain, impaired balance, mod difficulty performing mobility tasks, and decreased activity tolerance vs baseline. Pt to benefit from acute PT to address deficits. Pt requiring mod-max assist for bed mobility and transfer to stand today, limited standing tolerance due to weakness and bilat foot pain. At baseline, pt is independent with mobility and ADL tasks at home. PT to progress mobility as tolerated, and will continue to follow acutely.      Follow Up Recommendations CIR    Equipment Recommendations  Rolling walker with 5" wheels;3in1 (PT)    Recommendations for Other Services       Precautions / Restrictions Precautions Precautions: Fall;Other (comment) Precaution Comments: chest discomfort due to prolonged CPR, on 2LO2 watch sats Restrictions Weight Bearing Restrictions: No      Mobility  Bed Mobility Overal bed mobility: Needs Assistance Bed Mobility: Sidelying to Sit;Rolling;Sit to Supine Rolling: Mod assist Sidelying to sit: Mod assist;HOB elevated   Sit to supine: Max assist;HOB elevated   General bed mobility comments: mod assist for log roll to EOB and trunk elevation/LE lowering. Max assist for return to supine for LE lifting into bed, trunk lowering, boost up in bed with bed pads and trendelenburg.    Transfers Overall transfer level: Needs assistance Equipment used: Rolling walker (2 wheeled) Transfers: Sit to/from Stand Sit  to Stand: Mod assist;From elevated surface         General transfer comment: Mod assist from very elevated bed height for power up, rise, steadying. First stand attempt unsuccessful, requiring increasing bed height and increased boost assist from pt trunk. Pt took x1 lateral steps towards [facility] but could not progress due to severe bilat foot pain (swelling). SpO2 86% on 2LO2, recovered to 90% with breathing technique cues and rest. HRmax 117 bpm.  Ambulation/Gait             General Gait Details: unable  Stairs            Wheelchair Mobility    Modified Rankin (Stroke Patients Only)       Balance Overall balance assessment: Needs assistance Sitting-balance support: No upper extremity supported;Feet supported Sitting balance-Leahy Scale: Fair Sitting balance - Comments: cannot accept challenge to dynamic balance, preference for posterior leaning Postural control: Posterior lean Standing balance support: Bilateral upper extremity supported;During functional activity Standing balance-Leahy Scale: Poor Standing balance comment: reliant on bilat UE support                             Pertinent Vitals/Pain Pain Assessment: Faces Faces Pain Scale: Hurts even more Pain Location: chest from CPR, plantar surface of feet Pain Descriptors / Indicators: Sore;Discomfort;Moaning Pain Intervention(s): Limited activity within patient's tolerance;Monitored during session;Repositioned    Home Living Family/patient expects to be discharged to:: Private residence Living Arrangements: Children Available Help at Discharge: Family;Friend(s);Neighbor Type of Home: House Home Access: Level entry     Home Layout: One level Home Equipment: [place] -  single point      Prior Function Level of Independence: Needs assistance   Gait / Transfers Assistance Needed: pt reports ambulating with cane PRN  ADL's / Homemaking Assistance Needed: Pt does not drive, son who she lives with  or sister bring her groceries. Pt reports independence with dressing, bathing PTA        Hand Dominance   Dominant Hand: Right    Extremity/Trunk Assessment   Upper Extremity Assessment Upper Extremity Assessment: Defer to OT evaluation    Lower Extremity Assessment Lower Extremity Assessment: Generalized weakness (Pt able to perform partial ROM DF/PF, heel slides; able to perform quad set and weak hip abd/add)    Cervical / Trunk Assessment Cervical / Trunk Assessment: Normal  Communication   Communication: No difficulties  Cognition Arousal/Alertness: Awake/alert Behavior During Therapy: WFL for tasks assessed/performed;Flat affect Overall Cognitive Status: No family/caregiver present to determine baseline cognitive functioning Area of Impairment: Problem solving                             Problem Solving: Slow processing;Decreased initiation;Difficulty sequencing General Comments: A&Ox4, increased processing time. Requires multimodal cuing for mobility.      General Comments General comments (skin integrity, edema, etc.): Brief period of SpO2 to 78% when transitioning to sit to supine HOB flat, O2 line too short to reach so off O2 x10 seconds but immediately reattached when supine. PT boosted O2 from 2 to 3LO2 and coached breathing technique, sats recovered to 95% after approx 1 min. HRmax 117 bpm    Exercises General Exercises - Lower Extremity Ankle Circles/Pumps: AROM;Both;5 reps;Supine Quad Sets: AROM;Both;5 reps;Supine   Assessment/Plan    PT Assessment Patient needs continued PT services  PT Problem List Decreased strength;Decreased mobility;Decreased safety awareness;Decreased balance;Decreased knowledge of use of DME;Decreased activity tolerance;Decreased range of motion;Obesity;Cardiopulmonary status limiting activity;Pain       PT Treatment Interventions DME instruction;Therapeutic activities;Gait training;Therapeutic exercise;Patient/family  education;Balance training;Functional mobility training;Neuromuscular re-education    PT Goals (Current goals can be found in the Care Plan section)  Acute Rehab PT Goals Patient Stated Goal: get better PT Goal Formulation: With patient Time For Goal Achievement: 01/31/21 Potential to Achieve Goals: Good    Frequency Min 3X/week   Barriers to discharge        Co-evaluation               AM-PAC PT "6 Clicks" Mobility  Outcome Measure Help needed turning from your back to your side while in a flat bed without using bedrails?: A Lot Help needed moving from lying on your back to sitting on the side of a flat bed without using bedrails?: A Lot Help needed moving to and from a bed to a chair (including a wheelchair)?: A Lot Help needed standing up from a chair using your arms (e.g., wheelchair or bedside chair)?: A Lot Help needed to walk in hospital room?: Total Help needed climbing 3-5 steps with a railing? : Total 6 Click Score: 10    End of Session Equipment Utilized During Treatment: Gait belt;Oxygen Activity Tolerance: Patient limited by fatigue;Patient limited by pain Patient left: in bed;with call bell/phone within reach;with bed alarm set;Other (comment) (HOB elevated to 45 deg) Nurse Communication: Mobility status PT Visit Diagnosis: Muscle weakness (generalized) (M62.81);Other abnormalities of gait and mobility (R26.89);Pain Pain - Right/Left:  (midchest, bilat) Pain - part of body:  (feet)    Time: 1010-1037 PT Time Calculation (min) (ACUTE  ONLY): 27 min   Charges:   PT Evaluation $PT Eval Low Complexity: 1 Low PT Treatments $Therapeutic Activity: 8-22 mins       Stacie Glaze, PT DPT Acute Rehabilitation Services Pager [phone]  Office [phone]   Karnell Vanderloop E Ruffin Pyo 01/17/2021, 10:57 AM

## 2021-01-18 DIAGNOSIS — I469 Cardiac arrest, cause unspecified: Secondary | ICD-10-CM | POA: Diagnosis not present

## 2021-01-18 DIAGNOSIS — N179 Acute kidney failure, unspecified: Secondary | ICD-10-CM | POA: Diagnosis not present

## 2021-01-18 DIAGNOSIS — R579 Shock, unspecified: Secondary | ICD-10-CM | POA: Diagnosis not present

## 2021-01-18 DIAGNOSIS — I2699 Other pulmonary embolism without acute cor pulmonale: Secondary | ICD-10-CM | POA: Diagnosis not present

## 2021-01-18 LAB — CBC
HCT: 23.8 % — ABNORMAL LOW (ref 36.0–46.0)
Hemoglobin: 7.4 g/dL — ABNORMAL LOW (ref 12.0–15.0)
MCH: 26.5 pg (ref 26.0–34.0)
MCHC: 31.1 g/dL (ref 30.0–36.0)
MCV: 85.3 fL (ref 80.0–100.0)
Platelets: 281 10*3/uL (ref 150–400)
RBC: 2.79 MIL/uL — ABNORMAL LOW (ref 3.87–5.11)
RDW: 14.1 % (ref 11.5–15.5)
WBC: 14.4 10*3/uL — ABNORMAL HIGH (ref 4.0–10.5)
nRBC: 0.3 % — ABNORMAL HIGH (ref 0.0–0.2)

## 2021-01-18 LAB — GLUCOSE, CAPILLARY
Glucose-Capillary: 213 mg/dL — ABNORMAL HIGH (ref 70–99)
Glucose-Capillary: 195 mg/dL — ABNORMAL HIGH (ref 70–99)
Glucose-Capillary: 211 mg/dL — ABNORMAL HIGH (ref 70–99)
Glucose-Capillary: 252 mg/dL — ABNORMAL HIGH (ref 70–99)
Glucose-Capillary: 201 mg/dL — ABNORMAL HIGH (ref 70–99)

## 2021-01-18 LAB — RENAL FUNCTION PANEL
Albumin: 2.5 g/dL — ABNORMAL LOW (ref 3.5–5.0)
Anion gap: 6 (ref 5–15)
BUN: 74 mg/dL — ABNORMAL HIGH (ref 8–23)
CO2: 29 mmol/L (ref 22–32)
Calcium: 8.6 mg/dL — ABNORMAL LOW (ref 8.9–10.3)
Chloride: 101 mmol/L (ref 98–111)
Creatinine, Ser: 4.25 mg/dL — ABNORMAL HIGH (ref 0.44–1.00)
GFR, Estimated: 11 mL/min — ABNORMAL LOW (ref >60)
Glucose, Bld: 183 mg/dL — ABNORMAL HIGH (ref 70–99)
Phosphorus: 3.6 mg/dL (ref 2.5–4.6)
Potassium: 5 mmol/L (ref 3.5–5.1)
Sodium: 136 mmol/L (ref 135–145)

## 2021-01-18 LAB — HEPARIN LEVEL (UNFRACTIONATED): Heparin Unfractionated: 0.5 IU/mL (ref 0.30–0.70)

## 2021-01-18 MED ORDER — CARVEDILOL 6.25 MG PO TABS
6.2500 mg | ORAL_TABLET | Freq: Two times a day (BID) | ORAL | Status: DC
  Administered 2021-01-18 – 2021-01-27 (×18): 6.25 mg via ORAL
  Filled 2021-01-18 (×18): qty 1

## 2021-01-18 NOTE — Progress Notes (Signed)
[facility] NEPHROLOGY PROGRESS NOTE  Assessment/ Plan: Pt is a 70 y.o. yo female  with history of hypertension, type 2 diabetes, hyperlipidemia, obesity, CKD stage IIIb, admitted with cardiac arrest, bilateral PE, following for AKI on CKD.   #Acute kidney injury on CKD stage IIIb likely ischemic ATN in the setting of cardiogenic shock concomitant with the use of ACE inhibitor, IV contrast.  CKD 3B with baseline creatinine level 1.6 followed by Dr. Theador Hawthorne thought to be due to diabetic nephropathy, extensive outpatient serologies were negative.  Urinalysis is bland and kidney ultrasound without hydronephrosis.  She continues to have renal recovery with increasing urine output and serum creatinine level trending down.  She looks comfortable.  I recommend strict ins and out, daily lab.  No need for dialysis.  #Hyperkalemia: Requiring Lokelma.  Recommend low potassium diet.  #Cardiac arrest/cardiogenic shock: Status post CPR and ROSC after 30 minutes in the setting of PE and acute cor pulmonale.  #Acute hypoxic/hypercapnic respiratory failure extubated on 9/5.  Management of PE per primary team.  #Hypertension: Blood pressure elevated.  Continue amlodipine.  Started carvedilol on 9/10, I will increase dose today.  ACE inhibitor on hold because of AKI.  #Metabolic acidosis: Improved, discontinue oral sodium bicarbonate.  Subjective: Seen and examined at bedside.  Patient reports feeling good without any complaint.  Urine output 1.2 L documented overnight and another 1.2 L this morning.  Denies nausea, vomiting, chest pain or shortness of breath. Objective Vital signs in last 24 hours: Vitals:   01/17/21 2100 01/18/21 0111 01/18/21 0445 01/18/21 0742  BP:  (!) 156/71 (!) 155/66 (!) 162/73  Pulse:  94 98 98  Resp:  17 16 20   Temp:  97.7 F (36.5 C) 98.3 F (36.8 C) 98.3 F (36.8 C)  TempSrc:  Oral Oral Oral  SpO2: 97% 96% 98% 98%  Weight:      Height:       Weight change:    Intake/Output Summary (Last 24 hours) at 01/18/2021 1009 Last data filed at 01/18/2021 0826 Gross per 24 hour  Intake 562.42 ml  Output 2100 ml  Net -1537.58 ml       Labs: Basic Metabolic Panel: Recent Labs  Lab 01/16/21 0204 01/17/21 0106 01/18/21 0048  NA 133* 134* 136  K 5.5* 5.2* 5.0  CL 97* 100 101  CO2 23 26 29   GLUCOSE 183* 206* 183*  BUN 70* 74* 74*  CREATININE 5.39* 4.81* 4.25*  CALCIUM 8.8* 8.7* 8.6*  PHOS 4.9* 4.0 3.6   Liver Function Tests: Recent Labs  Lab 01/12/21 0557 01/16/21 0204 01/17/21 0106 01/18/21 0048  AST 87*  --   --   --   ALT 124*  --   --   --   ALKPHOS 80  --   --   --   BILITOT 0.4  --   --   --   PROT 5.4*  --   --   --   ALBUMIN 2.4* 2.7* 2.5* 2.5*   No results for input(s): LIPASE, AMYLASE in the last 168 hours. No results for input(s): AMMONIA in the last 168 hours. CBC: Recent Labs  Lab 01/14/21 0538 01/15/21 0144 01/16/21 0204 01/17/21 0106 01/18/21 0048  WBC 12.4* 12.7* 16.4* 15.7* 14.4*  HGB 8.1* 8.0* 7.3* 7.3* 7.4*  HCT 25.6* 25.7* 23.5* 22.7* 23.8*  MCV 84.8 86.2 85.8 84.7 85.3  PLT 171 217 228 241 281   Cardiac Enzymes: No results for input(s): CKTOTAL, CKMB, CKMBINDEX, TROPONINI  in the last 168 hours. CBG: Recent Labs  Lab 01/17/21 1138 01/17/21 1719 01/17/21 2117 01/18/21 0552 01/18/21 0816  GLUCAP 244* 236* 170* 213* 195*    Iron Studies: No results for input(s): IRON, TIBC, TRANSFERRIN, FERRITIN in the last 72 hours. Studies/Results: No results found.  Medications: Infusions:  sodium chloride Stopped (01/11/21 0813)   heparin 1,200 Units/hr (01/17/21 1938)    Scheduled Medications:  amLODipine  10 mg Oral Daily   aspirin EC  81 mg Oral Daily   azithromycin  500 mg Oral Daily   carvedilol  3.125 mg Oral BID WC   cholecalciferol  1,000 Units Oral Daily   insulin aspart  0-20 Units Subcutaneous TID WC   insulin aspart  0-5 Units Subcutaneous QHS   mouth rinse  15 mL Mouth Rinse BID    methylPREDNISolone (SOLU-MEDROL) injection  20 mg Intravenous Q12H   pantoprazole (PROTONIX) IV  40 mg Intravenous QHS   sodium bicarbonate  650 mg Oral BID    have reviewed scheduled and prn medications.  Physical Exam: General:NAD, comfortable Heart:RRR, s1s2 nl Lungs: Clear anteriorly, no wheeze or crackle appreciated Abdomen:soft, Non-tender, non-distended Extremities: Trace pitting ankle dependent edema. Neurology: Alert, awake, nonfocal  Brycen Bean Tanna Furry 01/18/2021,10:09 AM  LOS: 7 days

## 2021-01-18 NOTE — Evaluation (Signed)
Occupational Therapy Evaluation Patient Details Name: Kathleen [name] MRN: [id number] DOB: May 31, 1950 Today's Date: 01/18/2021    History of Present Illness 70 yo female presents on 9/3 with dizziness, SOB. While in CT, pt went into cardiac arrest x3 and received CPR 30 min with ROSC, ETT 9/3-9/5. CTA shows CTA bilateral pulmonary emboli with RV strain, on heparin. PMH includes hypertension, diabetes mellitus type 2, chronic kidney disease stage IIIb, PE.   Clinical Impression   Kathleen [name] was evaluated s/p the above admission list. PTA she was indep in all ADLs and mobility, she does not drive. She lives in an entry level, 1 level home with her son. Upon evaluation pt required moderate encouragement to participate with therapy. She ws mod A overall for bed mobility and declined further OOB mobility. She is currently requiring at least set up A for sitting ADLs, and up to max A for bed level ADLs. Pt is limited by poor activity tolerance, bilat LE pain, chest pain, poor trunk control and limited shoulder ROM. She would benefit from continued OT acutely. Recommend intensive therapies at CIR level to progress towards indep baseline.     Follow Up Recommendations  CIR    Equipment Recommendations  Other (comment) (defer to next venue of care)    Recommendations for Other Services Rehab consult     Precautions / Restrictions Precautions Precautions: Fall;Other (comment) Precaution Comments: chest discomfort due to prolonged CPR, on 2LO2 watch sats Restrictions Weight Bearing Restrictions: No      Mobility Bed Mobility Overal bed mobility: Needs Assistance Bed Mobility: Sidelying to Sit;Rolling;Sit to Supine Rolling: Min assist Sidelying to sit: Mod assist;HOB elevated   Sit to supine: Mod assist;HOB elevated   General bed mobility comments: min A for rolling, Mod A for elevating trunk to sit EOb and for BLE back into bed.    Transfers Overall transfer level: Needs assistance    Transfers: Lateral/Scoot Transfers          Lateral/Scoot Transfers: Max assist General transfer comment: max A to scoot laterall towards HOB, Pt requried max verbal cues for sequencing, positioning and initiation    Balance Overall balance assessment: Needs assistance Sitting-balance support: No upper extremity supported;Feet supported Sitting balance-Leahy Scale: Fair Sitting balance - Comments: cannot accept challenge to dynamic balance, preference for posterior leaning                                   ADL either performed or assessed with clinical judgement   ADL Overall ADL's : Needs assistance/impaired Eating/Feeding: Independent;Sitting   Grooming: Set up;Sitting   Upper Body Bathing: Minimal assistance;Sitting   Lower Body Bathing: Maximal assistance;Bed level Lower Body Bathing Details (indicate cue type and reason): pt declining OOB attempt this session, LB bathing at bed level Upper Body Dressing : Set up;Sitting   Lower Body Dressing: Maximal assistance;Bed level   Toilet Transfer: Total assistance Toilet Transfer Details (indicate cue type and reason): bed pan at this time Toileting- Clothing Manipulation and Hygiene: Total assistance;Bed level       Functional mobility during ADLs: Moderate assistance (pt agreeable to EOB and refused further OOB mobility.) General ADL Comments: Pt declining OOB this session therefore lower body ADLs limited to EOB adn bed level assist. Pt limited by pain in bilat feet, limited shoudler ROM, generalized weakness and chest pain     Vision Baseline Vision/History: 0 No visual deficits Ability to See in  Adequate Light: 0 Adequate Vision Assessment?: No apparent visual deficits     Perception Perception Perception Tested?: No   Praxis      Pertinent Vitals/Pain Pain Assessment: Faces Faces Pain Scale: Hurts even more Pain Location: chest from CPR, plantar surface of feet Pain Descriptors / Indicators:  Sore;Discomfort;Moaning Pain Intervention(s): Monitored during session;Limited activity within patient's tolerance     Hand Dominance Left   Extremity/Trunk Assessment Upper Extremity Assessment Upper Extremity Assessment: Generalized weakness (limited ROM of bilat shoulders, decreased coorindation of bilat hands)   Lower Extremity Assessment Lower Extremity Assessment: Defer to PT evaluation   Cervical / Trunk Assessment Cervical / Trunk Assessment: Normal;Other exceptions (increased body habitus)   Communication Communication Communication: No difficulties   Cognition Arousal/Alertness: Awake/alert Behavior During Therapy: WFL for tasks assessed/performed;Flat affect Overall Cognitive Status: No family/caregiver present to determine baseline cognitive functioning Area of Impairment: Problem solving                             Problem Solving: Slow processing;Decreased initiation;Difficulty sequencing General Comments: A&Ox4, increased processing time.   General Comments  SpO2 dropped to 89% while pt was donning her sock, recovered quickly with rest and breathing techniques    Exercises     Shoulder Instructions      Home Living Family/patient expects to be discharged to:: Private residence Living Arrangements: Children Available Help at Discharge: Family Type of Home: House Home Access: Level entry     Home Layout: One level     Bathroom Shower/Tub: Teacher, early years/pre: Standard Bathroom Accessibility: Yes How Accessible: Accessible via walker;Accessible via wheelchair Home Equipment: Kathleen [name] - single point      Lives With: Son    Prior Functioning/Environment Level of Independence: Needs assistance  Gait / Transfers Assistance Needed: pt reports ambulating with cane PRN ADL's / Homemaking Assistance Needed: Pt does not drive, son who she lives with or sister bring her groceries. Pt reports independence with dressing, bathing PTA             OT Problem List: Decreased strength;Decreased activity tolerance;Impaired balance (sitting and/or standing);Decreased safety awareness;Decreased knowledge of use of DME or AE;Decreased knowledge of precautions;Pain      OT Treatment/Interventions: Self-care/ADL training;Therapeutic exercise;Therapeutic activities;Balance training;Patient/family education;DME and/or AE instruction    OT Goals(Current goals can be found in the care plan section) Acute Rehab OT Goals Patient Stated Goal: get better OT Goal Formulation: With patient Time For Goal Achievement: 02/01/21 ADL Goals Pt Will Perform Grooming: with min guard assist;standing Pt Will Perform Upper Body Bathing: with set-up;sitting Pt Will Perform Lower Body Bathing: with min guard assist;sit to/from stand Pt Will Perform Upper Body Dressing: with set-up;sitting Pt Will Perform Lower Body Dressing: with min assist;sit to/from stand Pt Will Transfer to Toilet: with min guard assist;ambulating;grab bars Pt Will Perform Toileting - Clothing Manipulation and hygiene: with min guard assist;sit to/from stand Pt Will Perform Tub/Shower Transfer: with min assist;ambulating;shower seat  OT Frequency: Min 2X/week   Barriers to D/C:            Co-evaluation              AM-PAC OT "6 Clicks" Daily Activity     Outcome Measure Help from another person eating meals?: None Help from another person taking care of personal grooming?: A Little Help from another person toileting, which includes using toliet, bedpan, or urinal?: A Lot Help from another person  bathing (including washing, rinsing, drying)?: A Lot Help from another person to put on and taking off regular upper body clothing?: A Little Help from another person to put on and taking off regular lower body clothing?: A Lot 6 Click Score: 16   End of Session Equipment Utilized During Treatment: Oxygen Nurse Communication: Mobility status;Precautions;Weight bearing  status (Pt wants bath asap)  Activity Tolerance: Patient limited by pain;Patient limited by fatigue Patient left: in bed;with call bell/phone within reach;with bed alarm set  OT Visit Diagnosis: Unsteadiness on feet (R26.81);Other abnormalities of gait and mobility (R26.89);Muscle weakness (generalized) (M62.81);Pain                Time: 1100-1117 OT Time Calculation (min): 17 min Charges:  OT General Charges $OT Visit: 1 Visit OT Evaluation $OT Eval Moderate Complexity: 1 Mod   Psalm Schappell A Leia Coletti 01/18/2021, 12:12 PM

## 2021-01-18 NOTE — Progress Notes (Signed)
[facility] PROGRESS NOTE    Progress Note  Kathleen [name]  ZHG:[id number] DOB: [birth year] DOA: 01/10/2021 PCP: Fayrene Helper, MD     Brief Narrative:   Kathleen [name] is an 70 y.o. female past medical history of essential hypertension, diabetes mellitus type 2, chronic kidney disease stage IIIb, PE who was taken off Eliquis in July 2022 initially presented to [facility] 01/10/2021 with dyspnea and dizziness, was found to have recurrent PE with RV strain cardiac arrest with ROSC in about 30 minutes received thrombolytic therapy intubated and started on pressors and transferred to [facility]  Significant Events: 9/3 presented 9/4 Asystole arrest,  intubated 9/5 off pressors, extubated  Significant studies: CT angio chest 9/04 >> b/l PE, RV:LV ratio 2.7 CT head 9/04 >> mild white matter disease Doppler legs 9/04 >> no evidence of DVT Echo 9/04 >> EF 55 to 60%, mod LVH, grade 1 DD, positive McConnell's sign, mod RV systolic dysfunction, moderate elevation in PASP, mod/severe TR   Assessment/Plan:   Acute respiratory failure with hypoxia and hypercarbia in the setting of acute pulmonary embolism with cor pulmonale and probably acute bronchitis Requiring 2 L of oxygen to keep saturations greater than 96%, wean to room air. Continue IV heparin, creatinine is slowly improving.  She had some hemoptysis we will continue on IV heparin for now once her GFR is greater than 20 mL/min can restart her on Eliquis. She will need lifelong anticoagulation. Continue steroids, antibiotics and inhalers. I believe we can wean her to room air.  Essential hypertension: Blood pressure stable with mildly elevated continue Norvasc, continue to hold Catapres, lisinopril and Aldactone.  ATN in the setting of cardiogenic shock, lisinopril and IV contrast: With a baseline creatinine around 1.6 she had proteinuria due to diabetic nephropathy. Creatinine continues to improve with  conservative management continue to monitor electrolytes, her potassium today is 5.2. Appreciate nephrology assistance.  Metabolic anion gap acidosis: Continue bicarbonate tablets.  Chest pain likely due to CPR:  Due to prolonged CPR Continue Tylenol.  Diabetes mellitus type 2/diabetic neuropathy: Continue to hold Neurontin in the setting of renal disease continue sliding scale.  History of GERD: Continue PPI.  History of thyroid nodule: Follow-up with PCP as an outpatient for possible biopsy if needed.   DVT prophylaxis: heparin Family Communication:none Status is: Inpatient  Remains inpatient appropriate because:Hemodynamically unstable  Dispo: The patient is from: Home              Anticipated d/c is to: SNF              Patient currently is not medically stable to d/c.   Difficult to place patient No   Code Status:     Code Status Orders  (From admission, onward)           Start     Ordered   01/11/21 0755  Full code  Continuous        01/11/21 0759           Code Status History     Date Active Date Inactive Code Status Order ID Comments User Context   10/23/2019 0627 10/28/2019 2020 Full Code [id number]  Roxan Hockey, MD ED         IV Access:   Peripheral IV   Procedures and diagnostic studies:   No results found.   Medical Consultants:   None.   Subjective:    Kathleen [name] relates her breathing continues to improve.  Objective:  Vitals:   01/17/21 2100 01/18/21 0111 01/18/21 0445 01/18/21 0742  BP:  (!) 156/71 (!) 155/66 (!) 162/73  Pulse:  94 98 98  Resp:  17 16 20   Temp:  97.7 F (36.5 C) 98.3 F (36.8 C) 98.3 F (36.8 C)  TempSrc:  Oral Oral Oral  SpO2: 97% 96% 98% 98%  Weight:      Height:       SpO2: 98 % O2 Flow Rate (L/min): 2 L/min FiO2 (%): 28 %   Intake/Output Summary (Last 24 hours) at 01/18/2021 0801 Last data filed at 01/18/2021 0749 Gross per 24 hour  Intake 682.42 ml  Output 2400 ml   Net -1717.58 ml    Filed Weights   01/13/21 0335 01/13/21 2111 01/17/21 0441  Weight: 105.6 kg 104.7 kg 104.7 kg    Exam: General exam: In no acute distress. Respiratory system: Good air movement and clear to auscultation. Cardiovascular system: S1 & S2 heard, RRR. No JVD. Gastrointestinal system: Abdomen is nondistended, soft and nontender.  Extremities: No pedal edema. Skin: No rashes, lesions or ulcers  Data Reviewed:    Labs: Basic Metabolic Panel: Recent Labs  Lab 01/11/21 1300 01/12/21 0341 01/12/21 0557 01/13/21 0522 01/14/21 0113 01/15/21 0710 01/16/21 0204 01/17/21 0106 01/18/21 0048  NA 139   < > 138   < > 133* 134* 133* 134* 136  K 4.0   < > 4.6   < > 5.5* 5.7* 5.5* 5.2* 5.0  CL 102  --  102   < > 103 100 97* 100 101  CO2 18*  --  23   < > 17* 23 23 26 29   GLUCOSE 337*  --  81   < > 171* 203* 183* 206* 183*  BUN 39*  --  45*   < > 52* 57* 70* 74* 74*  CREATININE 2.86*  --  4.36*   < > 5.71* 5.75* 5.39* 4.81* 4.25*  CALCIUM 8.4*  --  8.4*   < > 8.3* 8.7* 8.8* 8.7* 8.6*  MG 1.9  --  1.5*  --   --   --   --   --   --   PHOS  --   --   --   --   --   --  4.9* 4.0 3.6   < > = values in this interval not displayed.    GFR Estimated Creatinine Clearance: 14.9 mL/min (A) (by C-G formula based on SCr of 4.25 mg/dL (H)). Liver Function Tests: Recent Labs  Lab 01/12/21 0557 01/16/21 0204 01/17/21 0106 01/18/21 0048  AST 87*  --   --   --   ALT 124*  --   --   --   ALKPHOS 80  --   --   --   BILITOT 0.4  --   --   --   PROT 5.4*  --   --   --   ALBUMIN 2.4* 2.7* 2.5* 2.5*    No results for input(s): LIPASE, AMYLASE in the last 168 hours. No results for input(s): AMMONIA in the last 168 hours. Coagulation profile No results for input(s): INR, PROTIME in the last 168 hours.  COVID-19 Labs  No results for input(s): DDIMER, FERRITIN, LDH, CRP in the last 72 hours.  Lab Results  Component Value Date   [place] NEGATIVE 01/11/2021   [place]  NEGATIVE 10/22/2019    CBC: Recent Labs  Lab 01/14/21 0538 01/15/21 0144 01/16/21 0204 01/17/21 0106 01/18/21 1607  WBC 12.4* 12.7* 16.4* 15.7* 14.4*  HGB 8.1* 8.0* 7.3* 7.3* 7.4*  HCT 25.6* 25.7* 23.5* 22.7* 23.8*  MCV 84.8 86.2 85.8 84.7 85.3  PLT 171 217 228 241 281    Cardiac Enzymes: No results for input(s): CKTOTAL, CKMB, CKMBINDEX, TROPONINI in the last 168 hours. BNP (last 3 results) No results for input(s): PROBNP in the last 8760 hours. CBG: Recent Labs  Lab 01/17/21 0555 01/17/21 1138 01/17/21 1719 01/17/21 2117 01/18/21 0552  GLUCAP 205* 244* 236* 170* 213*    D-Dimer: No results for input(s): DDIMER in the last 72 hours. Hgb A1c: No results for input(s): HGBA1C in the last 72 hours.  Lipid Profile: No results for input(s): CHOL, HDL, LDLCALC, TRIG, CHOLHDL, LDLDIRECT in the last 72 hours. Thyroid function studies: No results for input(s): TSH, T4TOTAL, T3FREE, THYROIDAB in the last 72 hours.  Invalid input(s): FREET3 Anemia work up: No results for input(s): VITAMINB12, FOLATE, FERRITIN, TIBC, IRON, RETICCTPCT in the last 72 hours. Sepsis Labs: Recent Labs  Lab 01/15/21 0144 01/16/21 0204 01/17/21 0106 01/18/21 0048  WBC 12.7* 16.4* 15.7* 14.4*    Microbiology Recent Results (from the past 240 hour(s))  Resp Panel by RT-PCR (Flu A&B, Covid) Nasopharyngeal Swab     Status: None   Collection Time: 01/11/21  4:48 AM   Specimen: Nasopharyngeal Swab; Nasopharyngeal(NP) swabs in vial transport medium  Result Value Ref Range Status   SARS Coronavirus 2 by RT PCR NEGATIVE NEGATIVE Final    Comment: (NOTE) SARS-CoV-2 target nucleic acids are NOT DETECTED.  The SARS-CoV-2 RNA is generally detectable in upper respiratory specimens during the acute phase of infection. The lowest concentration of SARS-CoV-2 viral copies this assay can detect is 138 copies/mL. A negative result does not preclude SARS-Cov-2 infection and should not be used as the  sole basis for treatment or other patient management decisions. A negative result may occur with  improper specimen collection/handling, submission of specimen other than nasopharyngeal swab, presence of viral mutation(s) within the areas targeted by this assay, and inadequate number of viral copies(<138 copies/mL). A negative result must be combined with clinical observations, patient history, and epidemiological information. The expected result is Negative.  Fact Sheet for Patients:  EntrepreneurPulse.com.au  Fact Sheet for Healthcare Providers:  IncredibleEmployment.be  This test is no t yet approved or cleared by the [place] FDA and  has been authorized for detection and/or diagnosis of SARS-CoV-2 by FDA under an Emergency Use Authorization (EUA). This EUA will remain  in effect (meaning this test can be used) for the duration of the COVID-19 declaration under Section 564(b)(1) of the Act, 21 U.S.C.section 360bbb-3(b)(1), unless the authorization is terminated  or revoked sooner.       Influenza A by PCR NEGATIVE NEGATIVE Final   Influenza B by PCR NEGATIVE NEGATIVE Final    Comment: (NOTE) The Xpert Xpress SARS-CoV-2/FLU/RSV plus assay is intended as an aid in the diagnosis of influenza from Nasopharyngeal swab specimens and should not be used as a sole basis for treatment. Nasal washings and aspirates are unacceptable for Xpert Xpress SARS-CoV-2/FLU/RSV testing.  Fact Sheet for Patients: EntrepreneurPulse.com.au  Fact Sheet for Healthcare Providers: IncredibleEmployment.be  This test is not yet approved or cleared by the [place] FDA and has been authorized for detection and/or diagnosis of SARS-CoV-2 by FDA under an Emergency Use Authorization (EUA). This EUA will remain in effect (meaning this test can be used) for the duration of the COVID-19 declaration under Section 564(b)(1) of the  Act, 21 U.S.C. section 360bbb-3(b)(1), unless the authorization is terminated or revoked.  Performed at [facility], [address], [place], [place] [address]   MRSA Next Gen by PCR, Nasal     Status: None   Collection Time: 01/11/21  6:03 AM   Specimen: Nasal Mucosa; Nasal Swab  Result Value Ref Range Status   MRSA by PCR Next Gen NOT DETECTED NOT DETECTED Final    Comment: (NOTE) The GeneXpert MRSA Assay (FDA approved for NASAL specimens only), is one component of a comprehensive MRSA colonization surveillance program. It is not intended to diagnose MRSA infection nor to guide or monitor treatment for MRSA infections. Test performance is not FDA approved in patients less than 1 years old. Performed at [facility], [place] [address], [place], [place] [address]   Respiratory (~20 pathogens) panel by PCR     Status: None   Collection Time: 01/16/21  1:19 PM   Specimen: Nasopharyngeal Swab; Respiratory  Result Value Ref Range Status   Adenovirus NOT DETECTED NOT DETECTED Final   Coronavirus 229E NOT DETECTED NOT DETECTED Final    Comment: (NOTE) The Coronavirus on the Respiratory Panel, DOES NOT test for the novel  Coronavirus (2019 nCoV)    Coronavirus HKU1 NOT DETECTED NOT DETECTED Final   Coronavirus NL63 NOT DETECTED NOT DETECTED Final   Coronavirus OC43 NOT DETECTED NOT DETECTED Final   Metapneumovirus NOT DETECTED NOT DETECTED Final   Rhinovirus / Enterovirus NOT DETECTED NOT DETECTED Final   Influenza A NOT DETECTED NOT DETECTED Final   Influenza B NOT DETECTED NOT DETECTED Final   Parainfluenza Virus 1 NOT DETECTED NOT DETECTED Final   Parainfluenza Virus 2 NOT DETECTED NOT DETECTED Final   Parainfluenza Virus 3 NOT DETECTED NOT DETECTED Final   Parainfluenza Virus 4 NOT DETECTED NOT DETECTED Final   Respiratory Syncytial Virus NOT DETECTED NOT DETECTED Final   Bordetella pertussis NOT DETECTED NOT DETECTED Final   Bordetella Parapertussis NOT DETECTED NOT  DETECTED Final   Chlamydophila pneumoniae NOT DETECTED NOT DETECTED Final   Mycoplasma pneumoniae NOT DETECTED NOT DETECTED Final    Comment: Performed at [facility], [place]. [address], [place], [place] [address]     Medications:    amLODipine  10 mg Oral Daily   aspirin EC  81 mg Oral Daily   azithromycin  500 mg Oral Daily   carvedilol  3.125 mg Oral BID WC   cholecalciferol  1,000 Units Oral Daily   insulin aspart  0-20 Units Subcutaneous TID WC   insulin aspart  0-5 Units Subcutaneous QHS   mouth rinse  15 mL Mouth Rinse BID   methylPREDNISolone (SOLU-MEDROL) injection  20 mg Intravenous Q12H   pantoprazole (PROTONIX) IV  40 mg Intravenous QHS   sodium bicarbonate  650 mg Oral BID   Continuous Infusions:  sodium chloride Stopped (01/11/21 0813)   heparin 1,200 Units/hr (01/17/21 1938)      LOS: 7 days   Charlynne Cousins  [facility]  01/18/2021, 8:01 AM

## 2021-01-18 NOTE — Progress Notes (Signed)
ANTICOAGULATION CONSULT NOTE - Follow Up Consult  Pharmacy Consult for heparin Indication: pulmonary embolus  No Known Allergies  Patient Measurements: Height: 5\' 6"  (167.6 cm) Weight: 104.7 kg (230 lb 13.2 oz) IBW/kg (Calculated) : 59.3 Heparin Dosing Weight: 81.6 kg  Vital Signs: Temp: 98.3 F (36.8 C) (09/11 0742) Temp Source: Oral (09/11 0742) BP: 162/73 (09/11 0742) Pulse Rate: 98 (09/11 0742)  Labs: Recent Labs    01/16/21 0204 01/16/21 1606 01/17/21 0106 01/17/21 1109 01/18/21 0048  HGB 7.3*  --  7.3*  --  7.4*  HCT 23.5*  --  22.7*  --  23.8*  PLT 228  --  241  --  281  HEPARINUNFRC 0.68   < > 0.93* 0.57 0.50  CREATININE 5.39*  --  4.81*  --  4.25*   < > = values in this interval not displayed.     Estimated Creatinine Clearance: 14.9 mL/min (A) (by C-G formula based on SCr of 4.25 mg/dL (H)).  Assessment: 70 year old female who arrested at [facility] with confirmed PE and received alteplase 100mg  on 9/04 @ 0300. Now >24 hours since Alteplase, continue heparin IV with goal heparin level between 0.3 to 0.7. Heparin was decreased to 1200 units/hr due to elevated heparin level at 0.93, and next heparin level was in goal at 0.57. Patient did report some mild occasional hemoptysis. MD aware. Heparin continued with close monitoring due to active pulmonary embolus.   Most recent level within goal at 0.50. CBC stable.  Goal of Therapy:  Heparin level 0.3-0.7 units/ml Monitor platelets by anticoagulation protocol: Yes   Plan:  Continue heparin 1200 units/hr  Check daily heparin level and CBC Monitor daily CBC, s/sx of bleeding  Thank you for including pharmacy in the care of this patient.  Zenaida Deed, PharmD PGY1 Acute Care Pharmacy Resident  Phone: [phone] 01/18/2021  10:29 AM  Please check [email or web address] for unit-specific pharmacy phone numbers.

## 2021-01-19 DIAGNOSIS — N179 Acute kidney failure, unspecified: Secondary | ICD-10-CM | POA: Diagnosis not present

## 2021-01-19 DIAGNOSIS — I2699 Other pulmonary embolism without acute cor pulmonale: Secondary | ICD-10-CM | POA: Diagnosis not present

## 2021-01-19 DIAGNOSIS — R579 Shock, unspecified: Secondary | ICD-10-CM | POA: Diagnosis not present

## 2021-01-19 DIAGNOSIS — I469 Cardiac arrest, cause unspecified: Secondary | ICD-10-CM | POA: Diagnosis not present

## 2021-01-19 LAB — GLUCOSE, CAPILLARY
Glucose-Capillary: 229 mg/dL — ABNORMAL HIGH (ref 70–99)
Glucose-Capillary: 173 mg/dL — ABNORMAL HIGH (ref 70–99)
Glucose-Capillary: 180 mg/dL — ABNORMAL HIGH (ref 70–99)
Glucose-Capillary: 216 mg/dL — ABNORMAL HIGH (ref 70–99)

## 2021-01-19 LAB — CBC
HCT: 22.7 % — ABNORMAL LOW (ref 36.0–46.0)
Hemoglobin: 7.2 g/dL — ABNORMAL LOW (ref 12.0–15.0)
MCH: 27.2 pg (ref 26.0–34.0)
MCHC: 31.7 g/dL (ref 30.0–36.0)
MCV: 85.7 fL (ref 80.0–100.0)
Platelets: 276 10*3/uL (ref 150–400)
RBC: 2.65 MIL/uL — ABNORMAL LOW (ref 3.87–5.11)
RDW: 14.2 % (ref 11.5–15.5)
WBC: 13 10*3/uL — ABNORMAL HIGH (ref 4.0–10.5)
nRBC: 0.4 % — ABNORMAL HIGH (ref 0.0–0.2)

## 2021-01-19 LAB — RENAL FUNCTION PANEL
Albumin: 2.3 g/dL — ABNORMAL LOW (ref 3.5–5.0)
Anion gap: 8 (ref 5–15)
BUN: 77 mg/dL — ABNORMAL HIGH (ref 8–23)
CO2: 27 mmol/L (ref 22–32)
Calcium: 8.5 mg/dL — ABNORMAL LOW (ref 8.9–10.3)
Chloride: 100 mmol/L (ref 98–111)
Creatinine, Ser: 3.71 mg/dL — ABNORMAL HIGH (ref 0.44–1.00)
GFR, Estimated: 12 mL/min — ABNORMAL LOW (ref >60)
Glucose, Bld: 182 mg/dL — ABNORMAL HIGH (ref 70–99)
Phosphorus: 3.5 mg/dL (ref 2.5–4.6)
Potassium: 5.8 mmol/L — ABNORMAL HIGH (ref 3.5–5.1)
Sodium: 135 mmol/L (ref 135–145)

## 2021-01-19 LAB — HEPARIN LEVEL (UNFRACTIONATED): Heparin Unfractionated: 0.46 IU/mL (ref 0.30–0.70)

## 2021-01-19 MED ORDER — CLONIDINE HCL 0.1 MG PO TABS
0.1000 mg | ORAL_TABLET | Freq: Every day | ORAL | Status: DC
  Administered 2021-01-19 – 2021-01-27 (×9): 0.1 mg via ORAL
  Filled 2021-01-19 (×9): qty 1

## 2021-01-19 MED ORDER — SODIUM ZIRCONIUM CYCLOSILICATE 10 G PO PACK: 10.0000 g | PACK | Freq: Two times a day (BID) | ORAL | Status: DC

## 2021-01-19 MED ORDER — SODIUM ZIRCONIUM CYCLOSILICATE 10 G PO PACK
10.0000 g | PACK | Freq: Two times a day (BID) | ORAL | Status: AC
  Administered 2021-01-19 (×2): 10 g via ORAL
  Filled 2021-01-19 (×2): qty 1

## 2021-01-19 MED ORDER — PREDNISONE 10 MG PO TABS
10.0000 mg | ORAL_TABLET | Freq: Every day | ORAL | Status: DC
  Filled 2021-01-19: qty 1

## 2021-01-19 NOTE — Progress Notes (Signed)
[facility] PROGRESS NOTE    Progress Note  Kathleen [name]  ZOX:[id number] DOB: 1951-04-05 DOA: 01/10/2021 PCP: Fayrene Helper, MD     Brief Narrative:   Kathleen [name] is an 70 y.o. female past medical history of essential hypertension, diabetes mellitus type 2, chronic kidney disease stage IIIb, PE who was taken off Eliquis in July 2022 initially presented to [facility] 01/10/2021 with dyspnea and dizziness, was found to have recurrent PE with RV strain cardiac arrest with ROSC in about 30 minutes received thrombolytic therapy intubated and started on pressors and transferred to [facility]  Significant Events: 9/3 presented 9/4 Asystole arrest,  intubated 9/5 off pressors, extubated  Significant studies: CT angio chest 9/04 >> b/l PE, RV:LV ratio 2.7 CT head 9/04 >> mild white matter disease Doppler legs 9/04 >> no evidence of DVT Echo 9/04 >> EF 55 to 60%, mod LVH, grade 1 DD, positive McConnell's sign, mod RV systolic dysfunction, moderate elevation in PASP, mod/severe TR   Assessment/Plan:   Acute respiratory failure with hypoxia and hypercarbia in the setting of acute pulmonary embolism with cor pulmonale and probably acute bronchitis Requiring 2 L of oxygen to keep saturations greater than 96%, wean to room air. Continue IV heparin her GFR still less than 20 mL/min. Was greater than 20 mL/min can start her on Eliquis which she will need lifelong. Continue steroids, antibiotics and inhalers. I believe we can wean her to room air. Will consult inpatient rehab to see if she qualifies.  Essential hypertension: Blood pressure stable with mildly elevated continue Norvasc, blood pressure is trending up we will start Catapres at a lower dose. She will have to be discharged off lisinopril and Aldactone which will have to be resumed in 2 weeks after her creatinine returns to baseline..  ATN in the setting of cardiogenic shock, lisinopril and IV contrast: With a  baseline creatinine around 1.6 she had proteinuria due to diabetic nephropathy. Creatinine continues to improve, this morning is 3.7. GI sign off appreciate assistance.  Metabolic anion gap acidosis: Continue bicarbonate tablets.  Chest pain likely due to CPR:  Due to prolonged CPR Continue Tylenol.  Diabetes mellitus type 2/diabetic neuropathy: Continue to hold Neurontin in the setting of renal disease continue sliding scale.  History of GERD: Continue PPI.  History of thyroid nodule: Follow-up with PCP as an outpatient for possible biopsy if needed.   DVT prophylaxis: heparin Family Communication:none Status is: Inpatient  Remains inpatient appropriate because:Hemodynamically unstable  Dispo: The patient is from: Home              Anticipated d/c is to: SNF              Patient currently is not medically stable to d/c.   Difficult to place patient No   Code Status:     Code Status Orders  (From admission, onward)           Start     Ordered   01/11/21 0755  Full code  Continuous        01/11/21 0759           Code Status History     Date Active Date Inactive Code Status Order ID Comments User Context   10/23/2019 0627 10/28/2019 2020 Full Code [id number]  Roxan Hockey, MD ED         IV Access:   Peripheral IV   Procedures and diagnostic studies:   No results found.   Medical  Consultants:   None.   Subjective:    Kathleen [name] she relates she really is ready to get out here  Objective:    Vitals:   01/18/21 1728 01/18/21 2019 01/18/21 2354 01/19/21 0340  BP: (!) 156/68 (!) 151/64 (!) 141/72 (!) 153/66  Pulse: 92 92 87 91  Resp: 15 18 20 19   Temp: 98 F (36.7 C) 98.8 F (37.1 C) 98.6 F (37 C) 98 F (36.7 C)  TempSrc: Oral Oral Oral Oral  SpO2: 98% 96% 95% 96%  Weight:      Height:       SpO2: 96 % O2 Flow Rate (L/min): 2 L/min FiO2 (%): 28 %   Intake/Output Summary (Last 24 hours) at 01/19/2021 0828 Last data  filed at 01/18/2021 1758 Gross per 24 hour  Intake 243.02 ml  Output 1150 ml  Net -906.98 ml    Filed Weights   01/13/21 0335 01/13/21 2111 01/17/21 0441  Weight: 105.6 kg 104.7 kg 104.7 kg    Exam: General exam: In no acute distress. Respiratory system: Good air movement and clear to auscultation. Cardiovascular system: S1 & S2 heard, RRR. No JVD. Gastrointestinal system: Abdomen is nondistended, soft and nontender.  Extremities: No pedal edema. Skin: No rashes, lesions or ulcers Psychiatry: Judgement and insight appear normal. Mood & affect appropriate.  Data Reviewed:    Labs: Basic Metabolic Panel: Recent Labs  Lab 01/15/21 0710 01/16/21 0204 01/17/21 0106 01/18/21 0048 01/19/21 0148  NA 134* 133* 134* 136 135  K 5.7* 5.5* 5.2* 5.0 5.8*  CL 100 97* 100 101 100  CO2 23 23 26 29 27   GLUCOSE 203* 183* 206* 183* 182*  BUN 57* 70* 74* 74* 77*  CREATININE 5.75* 5.39* 4.81* 4.25* 3.71*  CALCIUM 8.7* 8.8* 8.7* 8.6* 8.5*  PHOS  --  4.9* 4.0 3.6 3.5    GFR Estimated Creatinine Clearance: 17 mL/min (A) (by C-G formula based on SCr of 3.71 mg/dL (H)). Liver Function Tests: Recent Labs  Lab 01/16/21 0204 01/17/21 0106 01/18/21 0048 01/19/21 0148  ALBUMIN 2.7* 2.5* 2.5* 2.3*    No results for input(s): LIPASE, AMYLASE in the last 168 hours. No results for input(s): AMMONIA in the last 168 hours. Coagulation profile No results for input(s): INR, PROTIME in the last 168 hours.  COVID-19 Labs  No results for input(s): DDIMER, FERRITIN, LDH, CRP in the last 72 hours.  Lab Results  Component Value Date   SARSCOV2NAA NEGATIVE 01/11/2021   [place] NEGATIVE 10/22/2019    CBC: Recent Labs  Lab 01/15/21 0144 01/16/21 0204 01/17/21 0106 01/18/21 0048 01/19/21 0148  WBC 12.7* 16.4* 15.7* 14.4* 13.0*  HGB 8.0* 7.3* 7.3* 7.4* 7.2*  HCT 25.7* 23.5* 22.7* 23.8* 22.7*  MCV 86.2 85.8 84.7 85.3 85.7  PLT 217 228 241 281 276    Cardiac Enzymes: No results  for input(s): CKTOTAL, CKMB, CKMBINDEX, TROPONINI in the last 168 hours. BNP (last 3 results) No results for input(s): PROBNP in the last 8760 hours. CBG: Recent Labs  Lab 01/18/21 0816 01/18/21 1212 01/18/21 1727 01/18/21 2058 01/19/21 0639  GLUCAP 195* 211* 252* 201* 229*    D-Dimer: No results for input(s): DDIMER in the last 72 hours. Hgb A1c: No results for input(s): HGBA1C in the last 72 hours.  Lipid Profile: No results for input(s): CHOL, HDL, LDLCALC, TRIG, CHOLHDL, LDLDIRECT in the last 72 hours. Thyroid function studies: No results for input(s): TSH, T4TOTAL, T3FREE, THYROIDAB in the last 72 hours.  Invalid  input(s): FREET3 Anemia work up: No results for input(s): VITAMINB12, FOLATE, FERRITIN, TIBC, IRON, RETICCTPCT in the last 72 hours. Sepsis Labs: Recent Labs  Lab 01/16/21 0204 01/17/21 0106 01/18/21 0048 01/19/21 0148  WBC 16.4* 15.7* 14.4* 13.0*    Microbiology Recent Results (from the past 240 hour(s))  Resp Panel by RT-PCR (Flu A&B, Covid) Nasopharyngeal Swab     Status: None   Collection Time: 01/11/21  4:48 AM   Specimen: Nasopharyngeal Swab; Nasopharyngeal(NP) swabs in vial transport medium  Result Value Ref Range Status   SARS Coronavirus 2 by RT PCR NEGATIVE NEGATIVE Final    Comment: (NOTE) SARS-CoV-2 target nucleic acids are NOT DETECTED.  The SARS-CoV-2 RNA is generally detectable in upper respiratory specimens during the acute phase of infection. The lowest concentration of SARS-CoV-2 viral copies this assay can detect is 138 copies/mL. A negative result does not preclude SARS-Cov-2 infection and should not be used as the sole basis for treatment or other patient management decisions. A negative result may occur with  improper specimen collection/handling, submission of specimen other than nasopharyngeal swab, presence of viral mutation(s) within the areas targeted by this assay, and inadequate number of viral copies(<138 copies/mL).  A negative result must be combined with clinical observations, patient history, and epidemiological information. The expected result is Negative.  Fact Sheet for Patients:  EntrepreneurPulse.com.au  Fact Sheet for Healthcare Providers:  IncredibleEmployment.be  This test is no t yet approved or cleared by the [place] FDA and  has been authorized for detection and/or diagnosis of SARS-CoV-2 by FDA under an Emergency Use Authorization (EUA). This EUA will remain  in effect (meaning this test can be used) for the duration of the COVID-19 declaration under Section 564(b)(1) of the Act, 21 U.S.C.section 360bbb-3(b)(1), unless the authorization is terminated  or revoked sooner.       Influenza A by PCR NEGATIVE NEGATIVE Final   Influenza B by PCR NEGATIVE NEGATIVE Final    Comment: (NOTE) The Xpert Xpress SARS-CoV-2/FLU/RSV plus assay is intended as an aid in the diagnosis of influenza from Nasopharyngeal swab specimens and should not be used as a sole basis for treatment. Nasal washings and aspirates are unacceptable for Xpert Xpress SARS-CoV-2/FLU/RSV testing.  Fact Sheet for Patients: EntrepreneurPulse.com.au  Fact Sheet for Healthcare Providers: IncredibleEmployment.be  This test is not yet approved or cleared by the [place] FDA and has been authorized for detection and/or diagnosis of SARS-CoV-2 by FDA under an Emergency Use Authorization (EUA). This EUA will remain in effect (meaning this test can be used) for the duration of the COVID-19 declaration under Section 564(b)(1) of the Act, 21 U.S.C. section 360bbb-3(b)(1), unless the authorization is terminated or revoked.  Performed at [facility], [address], [place], [place] [address]   MRSA Next Gen by PCR, Nasal     Status: None   Collection Time: 01/11/21  6:03 AM   Specimen: Nasal Mucosa; Nasal Swab  Result Value Ref Range Status    MRSA by PCR Next Gen NOT DETECTED NOT DETECTED Final    Comment: (NOTE) The GeneXpert MRSA Assay (FDA approved for NASAL specimens only), is one component of a comprehensive MRSA colonization surveillance program. It is not intended to diagnose MRSA infection nor to guide or monitor treatment for MRSA infections. Test performance is not FDA approved in patients less than 86 years old. Performed at [facility], [place] [address], [place], [place] [address]   Respiratory (~20 pathogens) panel by PCR  Status: None   Collection Time: 01/16/21  1:19 PM   Specimen: Nasopharyngeal Swab; Respiratory  Result Value Ref Range Status   Adenovirus NOT DETECTED NOT DETECTED Final   Coronavirus 229E NOT DETECTED NOT DETECTED Final    Comment: (NOTE) The Coronavirus on the Respiratory Panel, DOES NOT test for the novel  Coronavirus (2019 nCoV)    Coronavirus HKU1 NOT DETECTED NOT DETECTED Final   Coronavirus NL63 NOT DETECTED NOT DETECTED Final   Coronavirus OC43 NOT DETECTED NOT DETECTED Final   Metapneumovirus NOT DETECTED NOT DETECTED Final   Rhinovirus / Enterovirus NOT DETECTED NOT DETECTED Final   Influenza A NOT DETECTED NOT DETECTED Final   Influenza B NOT DETECTED NOT DETECTED Final   Parainfluenza Virus 1 NOT DETECTED NOT DETECTED Final   Parainfluenza Virus 2 NOT DETECTED NOT DETECTED Final   Parainfluenza Virus 3 NOT DETECTED NOT DETECTED Final   Parainfluenza Virus 4 NOT DETECTED NOT DETECTED Final   Respiratory Syncytial Virus NOT DETECTED NOT DETECTED Final   Bordetella pertussis NOT DETECTED NOT DETECTED Final   Bordetella Parapertussis NOT DETECTED NOT DETECTED Final   Chlamydophila pneumoniae NOT DETECTED NOT DETECTED Final   Mycoplasma pneumoniae NOT DETECTED NOT DETECTED Final    Comment: Performed at [facility], [place]. [address] [place], [place] [address]     Medications:    amLODipine  10 mg Oral Daily   aspirin EC  81 mg Oral Daily   azithromycin   500 mg Oral Daily   carvedilol  6.25 mg Oral BID WC   cholecalciferol  1,000 Units Oral Daily   insulin aspart  0-20 Units Subcutaneous TID WC   insulin aspart  0-5 Units Subcutaneous QHS   mouth rinse  15 mL Mouth Rinse BID   methylPREDNISolone (SOLU-MEDROL) injection  20 mg Intravenous Q12H   pantoprazole (PROTONIX) IV  40 mg Intravenous QHS   sodium zirconium cyclosilicate  10 g Oral BID   Continuous Infusions:  sodium chloride Stopped (01/11/21 0813)   heparin 1,200 Units/hr (01/18/21 1801)      LOS: 8 days   Charlynne Cousins  [facility]  01/19/2021, 8:28 AM

## 2021-01-19 NOTE — Progress Notes (Signed)
Inpatient Rehab Admissions Coordinator:  Saw pt at bedside. Informed her that will begin insurance authorization. Will continue to follow.   Gayland Curry, [place], [place] Admissions Coordinator [phone]

## 2021-01-19 NOTE — Progress Notes (Signed)
[facility] NEPHROLOGY PROGRESS NOTE  Assessment/ Plan: Pt is a 70 y.o. yo female  with history of hypertension, type 2 diabetes, hyperlipidemia, obesity, CKD stage IIIb, admitted with cardiac arrest, bilateral PE, following for AKI on CKD.   #Acute kidney injury on CKD stage IIIb likely ischemic ATN in the setting of cardiogenic shock concomitant with the use of ACE inhibitor, IV contrast.  CKD 3B with baseline creatinine level 1.6 followed by Dr. Theador Hawthorne ([place]) thought to be due to diabetic nephropathy, extensive outpatient serologies were negative.  Urinalysis is bland and kidney ultrasound without hydronephrosis.  She continues to have renal recovery with increasing urine output and serum creatinine level trending down.  She looks comfortable. recommend strict ins and out, daily lab.  No need for dialysis. -Continues to have renal recovery, nothing else to add from a nephrology perspective. Will sign off. Please call with any questions/concerns.  #Hyperkalemia: Requiring Lokelma, will give 10g BID for today.  Recommend low potassium diet. Possibly related to heparin?  #Cardiac arrest/cardiogenic shock: Status post CPR and ROSC after 30 minutes in the setting of PE and acute cor pulmonale.  #Acute hypoxic/hypercapnic respiratory failure extubated on 9/5.  Management of PE per primary team.  #Hypertension: Blood pressure elevated.  Continue amlodipine.  Started carvedilol on 9/10  ACE inhibitor on hold because of AKI.  #Metabolic acidosis: Improved, discontinue oral sodium bicarbonate.  Subjective: Seen and examined at bedside.  No complaints. UOP ~2.5L. no acute events Objective Vital signs in last 24 hours: Vitals:   01/18/21 1728 01/18/21 2019 01/18/21 2354 01/19/21 0340  BP: (!) 156/68 (!) 151/64 (!) 141/72 (!) 153/66  Pulse: 92 92 87 91  Resp: 15 18 20 19   Temp: 98 F (36.7 C) 98.8 F (37.1 C) 98.6 F (37 C) 98 F (36.7 C)  TempSrc: Oral Oral Oral Oral  SpO2:  98% 96% 95% 96%  Weight:      Height:       Weight change:   Intake/Output Summary (Last 24 hours) at 01/19/2021 0814 Last data filed at 01/18/2021 1758 Gross per 24 hour  Intake 243.02 ml  Output 1350 ml  Net -1106.98 ml       Labs: Basic Metabolic Panel: Recent Labs  Lab 01/17/21 0106 01/18/21 0048 01/19/21 0148  NA 134* 136 135  K 5.2* 5.0 5.8*  CL 100 101 100  CO2 26 29 27   GLUCOSE 206* 183* 182*  BUN 74* 74* 77*  CREATININE 4.81* 4.25* 3.71*  CALCIUM 8.7* 8.6* 8.5*  PHOS 4.0 3.6 3.5   Liver Function Tests: Recent Labs  Lab 01/17/21 0106 01/18/21 0048 01/19/21 0148  ALBUMIN 2.5* 2.5* 2.3*   No results for input(s): LIPASE, AMYLASE in the last 168 hours. No results for input(s): AMMONIA in the last 168 hours. CBC: Recent Labs  Lab 01/15/21 0144 01/16/21 0204 01/17/21 0106 01/18/21 0048 01/19/21 0148  WBC 12.7* 16.4* 15.7* 14.4* 13.0*  HGB 8.0* 7.3* 7.3* 7.4* 7.2*  HCT 25.7* 23.5* 22.7* 23.8* 22.7*  MCV 86.2 85.8 84.7 85.3 85.7  PLT 217 228 241 281 276   Cardiac Enzymes: No results for input(s): CKTOTAL, CKMB, CKMBINDEX, TROPONINI in the last 168 hours. CBG: Recent Labs  Lab 01/18/21 0816 01/18/21 1212 01/18/21 1727 01/18/21 2058 01/19/21 0639  GLUCAP 195* 211* 252* 201* 229*    Iron Studies: No results for input(s): IRON, TIBC, TRANSFERRIN, FERRITIN in the last 72 hours. Studies/Results: No results found.  Medications: Infusions:  sodium chloride Stopped (01/11/21  6568)   heparin 1,200 Units/hr (01/18/21 1801)    Scheduled Medications:  amLODipine  10 mg Oral Daily   aspirin EC  81 mg Oral Daily   azithromycin  500 mg Oral Daily   carvedilol  6.25 mg Oral BID WC   cholecalciferol  1,000 Units Oral Daily   insulin aspart  0-20 Units Subcutaneous TID WC   insulin aspart  0-5 Units Subcutaneous QHS   mouth rinse  15 mL Mouth Rinse BID   methylPREDNISolone (SOLU-MEDROL) injection  20 mg Intravenous Q12H   pantoprazole (PROTONIX)  IV  40 mg Intravenous QHS   sodium zirconium cyclosilicate  10 g Oral BID    have reviewed scheduled and prn medications.  Physical Exam: General:NAD, comfortable Heart:RRR, s1s2 nl Lungs: Clear anteriorly, no wheeze or crackle appreciated Abdomen:soft, Non-tender, non-distended Extremities: Trace pitting ankle dependent edema. Neurology: Alert, awake, nonfocal  Alexias Margerum 01/19/2021,8:14 AM  LOS: 8 days

## 2021-01-19 NOTE — Progress Notes (Signed)
  Pt c/o vertigo-like symptoms with dizziness laying in the bed worsening when she turns her head.  She states she has had this before but never took anything for it.   She is thinking she wants something b/c its worse than ever before. MD notified.  Orders pending

## 2021-01-19 NOTE — Progress Notes (Signed)
ANTICOAGULATION CONSULT NOTE - Follow Up Consult  Pharmacy Consult for heparin Indication: pulmonary embolus  No Known Allergies  Patient Measurements: Height: 5\' 6"  (167.6 cm) Weight: 104.7 kg (230 lb 13.2 oz) IBW/kg (Calculated) : 59.3 Heparin Dosing Weight: 81.6 kg  Vital Signs: Temp: 98 F (36.7 C) (09/12 0340) Temp Source: Oral (09/12 0340) BP: 153/66 (09/12 0340) Pulse Rate: 91 (09/12 0340)  Labs: Recent Labs    01/17/21 0106 01/17/21 1109 01/18/21 0048 01/19/21 0148  HGB 7.3*  --  7.4* 7.2*  HCT 22.7*  --  23.8* 22.7*  PLT 241  --  281 276  HEPARINUNFRC 0.93* 0.57 0.50 0.46  CREATININE 4.81*  --  4.25* 3.71*     Estimated Creatinine Clearance: 17 mL/min (A) (by C-G formula based on SCr of 3.71 mg/dL (H)).  Assessment: 70 year old female who arrested at [facility] with confirmed PE and received alteplase 100mg  on 9/04 @ 0300.  Heparin level remains therapeutic on 1200 units/hr, remains on heparin gtt d/t some hemoptysis, H/H low but stable  Goal of Therapy:  Heparin level 0.3-0.7 units/ml Monitor platelets by anticoagulation protocol: Yes   Plan:  Continue heparin gtt at 1200 units/hr Daily heparin level, CBC, s/s bleeding F/u hemoptysis and renal fxn, ability to transition to [place], PharmD Clinical Pharmacist ED Pharmacist Phone # [phone] 01/19/2021 7:43 AM

## 2021-01-20 DIAGNOSIS — I2699 Other pulmonary embolism without acute cor pulmonale: Secondary | ICD-10-CM | POA: Diagnosis not present

## 2021-01-20 DIAGNOSIS — N179 Acute kidney failure, unspecified: Secondary | ICD-10-CM | POA: Diagnosis not present

## 2021-01-20 DIAGNOSIS — J9602 Acute respiratory failure with hypercapnia: Secondary | ICD-10-CM | POA: Diagnosis not present

## 2021-01-20 LAB — BASIC METABOLIC PANEL
Anion gap: 7 (ref 5–15)
BUN: 64 mg/dL — ABNORMAL HIGH (ref 8–23)
CO2: 28 mmol/L (ref 22–32)
Calcium: 8.3 mg/dL — ABNORMAL LOW (ref 8.9–10.3)
Chloride: 98 mmol/L (ref 98–111)
Creatinine, Ser: 3.33 mg/dL — ABNORMAL HIGH (ref 0.44–1.00)
GFR, Estimated: 14 mL/min — ABNORMAL LOW (ref >60)
Glucose, Bld: 206 mg/dL — ABNORMAL HIGH (ref 70–99)
Potassium: 4.1 mmol/L (ref 3.5–5.1)
Sodium: 133 mmol/L — ABNORMAL LOW (ref 135–145)

## 2021-01-20 LAB — CBC
HCT: 23 % — ABNORMAL LOW (ref 36.0–46.0)
Hemoglobin: 7.2 g/dL — ABNORMAL LOW (ref 12.0–15.0)
MCH: 26.9 pg (ref 26.0–34.0)
MCHC: 31.3 g/dL (ref 30.0–36.0)
MCV: 85.8 fL (ref 80.0–100.0)
Platelets: 316 10*3/uL (ref 150–400)
RBC: 2.68 MIL/uL — ABNORMAL LOW (ref 3.87–5.11)
RDW: 14 % (ref 11.5–15.5)
WBC: 11.5 10*3/uL — ABNORMAL HIGH (ref 4.0–10.5)
nRBC: 0.5 % — ABNORMAL HIGH (ref 0.0–0.2)

## 2021-01-20 LAB — GLUCOSE, CAPILLARY
Glucose-Capillary: 189 mg/dL — ABNORMAL HIGH (ref 70–99)
Glucose-Capillary: 250 mg/dL — ABNORMAL HIGH (ref 70–99)
Glucose-Capillary: 224 mg/dL — ABNORMAL HIGH (ref 70–99)
Glucose-Capillary: 169 mg/dL — ABNORMAL HIGH (ref 70–99)
Glucose-Capillary: 201 mg/dL — ABNORMAL HIGH (ref 70–99)

## 2021-01-20 LAB — HEPARIN LEVEL (UNFRACTIONATED): Heparin Unfractionated: 0.47 IU/mL (ref 0.30–0.70)

## 2021-01-20 MED ORDER — PREDNISONE 5 MG PO TABS
5.0000 mg | ORAL_TABLET | Freq: Every day | ORAL | Status: DC
  Filled 2021-01-20: qty 1

## 2021-01-20 MED ORDER — PREDNISONE 5 MG PO TABS
5.0000 mg | ORAL_TABLET | Freq: Every day | ORAL | Status: AC
  Administered 2021-01-20: 5 mg via ORAL

## 2021-01-20 MED FILL — Fentanyl Citrate Preservative Free (PF) Inj 100 MCG/2ML: INTRAMUSCULAR | Qty: 2 | Status: AC

## 2021-01-20 MED FILL — Midazolam HCl Inj 5 MG/5ML (Base Equivalent): INTRAMUSCULAR | Qty: 3 | Status: AC

## 2021-01-20 NOTE — Progress Notes (Signed)
Mobility Specialist Progress Note    01/20/21 1351  Mobility  Activity Transferred:  Chair to bed  Level of Assistance +2 (takes two people)  Company secretary Response Tolerated fair  Mobility performed by Mobility specialist  $Mobility charge 1 Mobility   Pre-Mobility:164/72 BP  Pt needed to be moved after finishing her lunch. Pt c/o nausea and pain in feet. Returned with call bell and RN notified.   Hildred Alamin Mobility Specialist  Mobility Specialist Phone: [phone]

## 2021-01-20 NOTE — Progress Notes (Signed)
Physical Therapy Treatment Patient Details Name: Kathleen [name] MRN: [id number] DOB: 1950/08/22 Today's Date: 01/20/2021   History of Present Illness Pt is a 70 y.o. female admitted 01/10/21 with c/o dizziness and SOB. While in CT, pt went into cardiac arrest x3 requiring ~30-min CPR with ROSC. ETT 9/3-9/5. Head CT negative for acute abnormality. CTA showed bilateral PE with RV strain. PMH includes HTN, DM2, CKD 3, PE.   PT Comments    Pt progressing with mobility. Today's session focused on transfers and initiating gait training with RW, pt requiring heavy modA for mobility. Pt limited by generalized weakness, decreased activity tolerance, impaired balance strategies/postural reactions, and bilateral foot pain; at high risk for falls. Continue to recommend intensive CIR-level therapies to maximize functional mobility and independence prior to return home; pt in agreement.    Recommendations for follow up therapy are one component of a multi-disciplinary discharge planning process, led by the attending physician.  Recommendations may be updated based on patient status, additional functional criteria and insurance authorization.  Follow Up Recommendations  CIR;Supervision for mobility/OOB     Equipment Recommendations  Rolling walker with 5" wheels;3in1 (PT)    Recommendations for Other Services       Precautions / Restrictions Precautions Precautions: Fall Restrictions Weight Bearing Restrictions: No     Mobility  Bed Mobility Overal bed mobility: Needs Assistance Bed Mobility: Rolling;Sidelying to Sit Rolling: Supervision Sidelying to sit: Mod assist       General bed mobility comments: Pt able to roll well with use of bed rail, good ability to manage BLEs towards EOB, pt requiring modA to scoot hips towards EOB in order to reach feet to floor    Transfers Overall transfer level: Needs assistance Equipment used: Rolling walker (2 wheeled) Transfers: Sit to/from  Stand Sit to Stand: Mod assist;From elevated surface;+2 safety/equipment         General transfer comment: Pt requiring heavy modA to stand from slightly elevated bed height and BSC to RW, increase time and effort with cues for sequencing, requiring repetition of verbal cues each standing trial; max cues for safety to not sit prematurely in recliner, requiring mod-maxA for eccentric control  Ambulation/Gait Ambulation/Gait assistance: Min assist;Mod assist;+2 safety/equipment Gait Distance (Feet): 6 Feet Assistive device: Rolling walker (2 wheeled) Gait Pattern/deviations: Step-to pattern;Trunk flexed;Wide base of support;Leaning posteriorly;Antalgic Gait velocity: Decreased   General Gait Details: Pivotal steps from bed to [facility], then short ambulation distance from [facility] to recliner with RW, pt requiring min-modA for stability and frequent verbal cues to maintain upright posture and for safety/sequencing; pt requiring seated rest with c/o fatigue and dizziness, also limited by bilateral foot pain   Stairs             Wheelchair Mobility    Modified Rankin (Stroke Patients Only)       Balance Overall balance assessment: Needs assistance Sitting-balance support: No upper extremity supported;Feet supported Sitting balance-Leahy Scale: Fair     Standing balance support: Bilateral upper extremity supported;During functional activity Standing balance-Leahy Scale: Poor Standing balance comment: Heavy reliance on BUE support; dependent for posterior pericare                            Cognition Arousal/Alertness: Awake/alert Behavior During Therapy: WFL for tasks assessed/performed;Flat affect Overall Cognitive Status: No family/caregiver present to determine baseline cognitive functioning Area of Impairment: Following commands;Awareness;Problem solving  Following Commands: Follows one step commands consistently;Follows one step  commands with increased time   Awareness: Emergent Problem Solving: Slow processing;Decreased initiation;Difficulty sequencing;Requires verbal cues General Comments: Difficult to determine true cognitive impairment versus limitations exacerbated by pain/fatigue      Exercises      General Comments General comments (skin integrity, edema, etc.): Pt with c/o dizziness while using BSC (pt endorses "straining), BP 143/68      Pertinent Vitals/Pain Pain Assessment: Faces Faces Pain Scale: Hurts even more Pain Location: Bilateral feet > chest discomfort (from CPR) Pain Descriptors / Indicators: Sore;Discomfort;Moaning;Guarding Pain Intervention(s): Monitored during session;Limited activity within patient's tolerance;Other (comment) (pt with preference to wear slippers, but do not fit due to feet swelling)    Home Living                      Prior Function            PT Goals (current goals can now be found in the care plan section) Progress towards PT goals: Progressing toward goals    Frequency    Min 3X/week      PT Plan Current plan remains appropriate    Co-evaluation              AM-PAC PT "6 Clicks" Mobility   Outcome Measure  Help needed turning from your back to your side while in a flat bed without using bedrails?: A Lot Help needed moving from lying on your back to sitting on the side of a flat bed without using bedrails?: A Lot Help needed moving to and from a bed to a chair (including a wheelchair)?: A Lot Help needed standing up from a chair using your arms (e.g., wheelchair or bedside chair)?: A Lot Help needed to walk in hospital room?: A Lot Help needed climbing 3-5 steps with a railing? : Total 6 Click Score: 11    End of Session Equipment Utilized During Treatment: Gait belt Activity Tolerance: Patient tolerated treatment well;Patient limited by fatigue;Patient limited by pain Patient left: in chair;with call bell/phone within  reach;with chair alarm set Nurse Communication: Mobility status PT Visit Diagnosis: Muscle weakness (generalized) (M62.81);Other abnormalities of gait and mobility (R26.89);Pain     Time: 1050-1117 PT Time Calculation (min) (ACUTE ONLY): 27 min  Charges:  $Therapeutic Activity: 23-37 mins                     Mabeline Caras, PT, DPT Acute Rehabilitation Services  Pager [phone] Office [place] 01/20/2021, 12:50 PM

## 2021-01-20 NOTE — Progress Notes (Signed)
Inpatient Rehab Admissions Coordinator:   I do not yet have insurance auth or a bed for this pt. On CIR today. I will follow for potential admit pending insurance auth and bed availability.  Clemens Catholic, [place], [place] Admissions Coordinator  [phone] ([place]) [phone] (office)

## 2021-01-20 NOTE — Progress Notes (Signed)
[facility] PROGRESS NOTE    Progress Note  Kathleen [name]  NLG:[id number] DOB: [birth year] DOA: 01/10/2021 PCP: Fayrene Helper, MD     Brief Narrative:   Kathleen [name] is an 70 y.o. female past medical history of essential hypertension, diabetes mellitus type 2, chronic kidney disease stage IIIb, PE who was taken off Eliquis in July 2022 initially presented to [facility] 01/10/2021 with dyspnea and dizziness, was found to have recurrent PE with RV strain cardiac arrest with ROSC in about 30 minutes received thrombolytic therapy intubated and started on pressors and transferred to [facility]  Significant Events: 9/3 presented 9/4 Asystole arrest,  intubated 9/5 off pressors, extubated  Significant studies: CT angio chest 9/04 >> b/l PE, RV:LV ratio 2.7 CT head 9/04 >> mild white matter disease Doppler legs 9/04 >> no evidence of DVT Echo 9/04 >> EF 55 to 60%, mod LVH, grade 1 DD, positive McConnell's sign, mod RV systolic dysfunction, moderate elevation in PASP, mod/severe TR   Assessment/Plan:   Acute respiratory failure with hypoxia and hypercarbia in the setting of acute pulmonary embolism with cor pulmonale and probably acute bronchitis: She is requiring 1 L oxygen to keep saturations greater than 90%. Continue IV heparin her GFR still less than 20 mL/min. She will need lifelong Eliquis. She has completed her antibiotic treatment will stop steroids on 01/21/2021. I believe we can wean her to room air. Inpatient rehab has been consulted to see if she qualifies for admission.  Essential hypertension: Blood pressure stable with mildly elevated continue Norvasc, blood pressure is trending up we will start Catapres at a lower dose. She will have to be discharged off lisinopril and Aldactone which will have to be resumed in 2 weeks after her creatinine returns to baseline..  ATN in the setting of cardiogenic shock, lisinopril and IV contrast: With a baseline  creatinine around 1.6 she had proteinuria due to diabetic nephropathy. Creatinine continues to improve, her potassium is down. Her GFR is at 14 mL/min. Once she is greater than 20 mL/min can restart Eliquis.  Metabolic anion gap acidosis: Continue bicarbonate tablets.  Chest pain likely due to CPR:  Due to prolonged CPR Continue Tylenol.  Diabetes mellitus type 2/diabetic neuropathy: Continue to hold Neurontin in the setting of renal disease continue sliding scale.  History of GERD: Continue PPI.  History of thyroid nodule: Follow-up with PCP as an outpatient for possible biopsy if needed.   DVT prophylaxis: heparin Family Communication:none Status is: Inpatient  Remains inpatient appropriate because:Hemodynamically unstable  Dispo: The patient is from: Home              Anticipated d/c is to: SNF              Patient currently is not medically stable to d/c.   Difficult to place patient No   Code Status:     Code Status Orders  (From admission, onward)           Start     Ordered   01/11/21 0755  Full code  Continuous        01/11/21 0759           Code Status History     Date Active Date Inactive Code Status Order ID Comments User Context   10/23/2019 0627 10/28/2019 2020 Full Code [id number]  Roxan Hockey, MD ED         IV Access:   Peripheral IV   Procedures and diagnostic studies:  No results found.   Medical Consultants:   None.   Subjective:    Kathleen [name] is her breathing is significantly better she feels better she relates her soreness in her chest is better.  Objective:    Vitals:   01/19/21 2350 01/20/21 0403 01/20/21 0414 01/20/21 0725  BP: (!) 154/80 (!) 167/78  (!) 153/75  Pulse: 69 85  80  Resp: 20 20  20   Temp: 98.9 F (37.2 C) 98.4 F (36.9 C)  98 F (36.7 C)  TempSrc: Oral Oral  Oral  SpO2: 97% 97%  96%  Weight:   100.6 kg   Height:       SpO2: 96 % O2 Flow Rate (L/min): 1 L/min FiO2 (%): 28  %   Intake/Output Summary (Last 24 hours) at 01/20/2021 0824 Last data filed at 01/20/2021 0634 Gross per 24 hour  Intake 864 ml  Output 2700 ml  Net -1836 ml    Filed Weights   01/13/21 2111 01/17/21 0441 01/20/21 0414  Weight: 104.7 kg 104.7 kg 100.6 kg    Exam: General exam: In no acute distress. Respiratory system: Good air movement and clear to auscultation. Cardiovascular system: S1 & S2 heard, RRR. No JVD. Gastrointestinal system: Abdomen is nondistended, soft and nontender.  Extremities: No pedal edema. Skin: No rashes, lesions or ulcers Psychiatry: Judgement and insight appear normal. Mood & affect appropriate.  Data Reviewed:    Labs: Basic Metabolic Panel: Recent Labs  Lab 01/16/21 0204 01/17/21 0106 01/18/21 0048 01/19/21 0148 01/20/21 0146  NA 133* 134* 136 135 133*  K 5.5* 5.2* 5.0 5.8* 4.1  CL 97* 100 101 100 98  CO2 23 26 29 27 28   GLUCOSE 183* 206* 183* 182* 206*  BUN 70* 74* 74* 77* 64*  CREATININE 5.39* 4.81* 4.25* 3.71* 3.33*  CALCIUM 8.8* 8.7* 8.6* 8.5* 8.3*  PHOS 4.9* 4.0 3.6 3.5  --     GFR Estimated Creatinine Clearance: 18.5 mL/min (A) (by C-G formula based on SCr of 3.33 mg/dL (H)). Liver Function Tests: Recent Labs  Lab 01/16/21 0204 01/17/21 0106 01/18/21 0048 01/19/21 0148  ALBUMIN 2.7* 2.5* 2.5* 2.3*    No results for input(s): LIPASE, AMYLASE in the last 168 hours. No results for input(s): AMMONIA in the last 168 hours. Coagulation profile No results for input(s): INR, PROTIME in the last 168 hours.  COVID-19 Labs  No results for input(s): DDIMER, FERRITIN, LDH, CRP in the last 72 hours.  Lab Results  Component Value Date   SARSCOV2NAA NEGATIVE 01/11/2021   [place] NEGATIVE 10/22/2019    CBC: Recent Labs  Lab 01/16/21 0204 01/17/21 0106 01/18/21 0048 01/19/21 0148 01/20/21 0146  WBC 16.4* 15.7* 14.4* 13.0* 11.5*  HGB 7.3* 7.3* 7.4* 7.2* 7.2*  HCT 23.5* 22.7* 23.8* 22.7* 23.0*  MCV 85.8 84.7 85.3  85.7 85.8  PLT 228 241 281 276 316    Cardiac Enzymes: No results for input(s): CKTOTAL, CKMB, CKMBINDEX, TROPONINI in the last 168 hours. BNP (last 3 results) No results for input(s): PROBNP in the last 8760 hours. CBG: Recent Labs  Lab 01/19/21 0639 01/19/21 1210 01/19/21 1720 01/19/21 2133 01/20/21 0607  GLUCAP 229* 173* 180* 216* 189*    D-Dimer: No results for input(s): DDIMER in the last 72 hours. Hgb A1c: No results for input(s): HGBA1C in the last 72 hours.  Lipid Profile: No results for input(s): CHOL, HDL, LDLCALC, TRIG, CHOLHDL, LDLDIRECT in the last 72 hours. Thyroid function studies: No results for input(s):  TSH, T4TOTAL, T3FREE, THYROIDAB in the last 72 hours.  Invalid input(s): FREET3 Anemia work up: No results for input(s): VITAMINB12, FOLATE, FERRITIN, TIBC, IRON, RETICCTPCT in the last 72 hours. Sepsis Labs: Recent Labs  Lab 01/17/21 0106 01/18/21 0048 01/19/21 0148 01/20/21 0146  WBC 15.7* 14.4* 13.0* 11.5*    Microbiology Recent Results (from the past 240 hour(s))  Resp Panel by RT-PCR (Flu A&B, Covid) Nasopharyngeal Swab     Status: None   Collection Time: 01/11/21  4:48 AM   Specimen: Nasopharyngeal Swab; Nasopharyngeal(NP) swabs in vial transport medium  Result Value Ref Range Status   SARS Coronavirus 2 by RT PCR NEGATIVE NEGATIVE Final    Comment: (NOTE) SARS-CoV-2 target nucleic acids are NOT DETECTED.  The SARS-CoV-2 RNA is generally detectable in upper respiratory specimens during the acute phase of infection. The lowest concentration of SARS-CoV-2 viral copies this assay can detect is 138 copies/mL. A negative result does not preclude SARS-Cov-2 infection and should not be used as the sole basis for treatment or other patient management decisions. A negative result may occur with  improper specimen collection/handling, submission of specimen other than nasopharyngeal swab, presence of viral mutation(s) within the areas targeted  by this assay, and inadequate number of viral copies(<138 copies/mL). A negative result must be combined with clinical observations, patient history, and epidemiological information. The expected result is Negative.  Fact Sheet for Patients:  EntrepreneurPulse.com.au  Fact Sheet for Healthcare Providers:  IncredibleEmployment.be  This test is no t yet approved or cleared by the [place] FDA and  has been authorized for detection and/or diagnosis of SARS-CoV-2 by FDA under an Emergency Use Authorization (EUA). This EUA will remain  in effect (meaning this test can be used) for the duration of the COVID-19 declaration under Section 564(b)(1) of the Act, 21 U.S.C.section 360bbb-3(b)(1), unless the authorization is terminated  or revoked sooner.       Influenza A by PCR NEGATIVE NEGATIVE Final   Influenza B by PCR NEGATIVE NEGATIVE Final    Comment: (NOTE) The Xpert Xpress SARS-CoV-2/FLU/RSV plus assay is intended as an aid in the diagnosis of influenza from Nasopharyngeal swab specimens and should not be used as a sole basis for treatment. Nasal washings and aspirates are unacceptable for Xpert Xpress SARS-CoV-2/FLU/RSV testing.  Fact Sheet for Patients: EntrepreneurPulse.com.au  Fact Sheet for Healthcare Providers: IncredibleEmployment.be  This test is not yet approved or cleared by the [place] FDA and has been authorized for detection and/or diagnosis of SARS-CoV-2 by FDA under an Emergency Use Authorization (EUA). This EUA will remain in effect (meaning this test can be used) for the duration of the COVID-19 declaration under Section 564(b)(1) of the Act, 21 U.S.C. section 360bbb-3(b)(1), unless the authorization is terminated or revoked.  Performed at [facility], [address], [place], [place] [address]   MRSA Next Gen by PCR, Nasal     Status: None   Collection Time: 01/11/21  6:03 AM    Specimen: Nasal Mucosa; Nasal Swab  Result Value Ref Range Status   MRSA by PCR Next Gen NOT DETECTED NOT DETECTED Final    Comment: (NOTE) The GeneXpert MRSA Assay (FDA approved for NASAL specimens only), is one component of a comprehensive MRSA colonization surveillance program. It is not intended to diagnose MRSA infection nor to guide or monitor treatment for MRSA infections. Test performance is not FDA approved in patients less than 73 years old. Performed at [facility], [place] [address], [place], [place]  [address]   Respiratory (~20 pathogens) panel by PCR     Status: None   Collection Time: 01/16/21  1:19 PM   Specimen: Nasopharyngeal Swab; Respiratory  Result Value Ref Range Status   Adenovirus NOT DETECTED NOT DETECTED Final   Coronavirus 229E NOT DETECTED NOT DETECTED Final    Comment: (NOTE) The Coronavirus on the Respiratory Panel, DOES NOT test for the novel  Coronavirus (2019 nCoV)    Coronavirus HKU1 NOT DETECTED NOT DETECTED Final   Coronavirus NL63 NOT DETECTED NOT DETECTED Final   Coronavirus OC43 NOT DETECTED NOT DETECTED Final   Metapneumovirus NOT DETECTED NOT DETECTED Final   Rhinovirus / Enterovirus NOT DETECTED NOT DETECTED Final   Influenza A NOT DETECTED NOT DETECTED Final   Influenza B NOT DETECTED NOT DETECTED Final   Parainfluenza Virus 1 NOT DETECTED NOT DETECTED Final   Parainfluenza Virus 2 NOT DETECTED NOT DETECTED Final   Parainfluenza Virus 3 NOT DETECTED NOT DETECTED Final   Parainfluenza Virus 4 NOT DETECTED NOT DETECTED Final   Respiratory Syncytial Virus NOT DETECTED NOT DETECTED Final   Bordetella pertussis NOT DETECTED NOT DETECTED Final   Bordetella Parapertussis NOT DETECTED NOT DETECTED Final   Chlamydophila pneumoniae NOT DETECTED NOT DETECTED Final   Mycoplasma pneumoniae NOT DETECTED NOT DETECTED Final    Comment: Performed at [facility], [address], [place], [place] [address]     Medications:     amLODipine  10 mg Oral Daily   aspirin EC  81 mg Oral Daily   azithromycin  500 mg Oral Daily   carvedilol  6.25 mg Oral BID WC   cholecalciferol  1,000 Units Oral Daily   cloNIDine  0.1 mg Oral Daily   insulin aspart  0-20 Units Subcutaneous TID WC   insulin aspart  0-5 Units Subcutaneous QHS   mouth rinse  15 mL Mouth Rinse BID   pantoprazole (PROTONIX) IV  40 mg Intravenous QHS   predniSONE  10 mg Oral Q breakfast   Continuous Infusions:  sodium chloride Stopped (01/11/21 0813)   heparin 1,200 Units/hr (01/18/21 1801)      LOS: 9 days   Charlynne Cousins  [facility]  01/20/2021, 8:24 AM

## 2021-01-20 NOTE — Progress Notes (Signed)
ANTICOAGULATION CONSULT NOTE - Follow Up Consult  Pharmacy Consult for heparin Indication: pulmonary embolus  No Known Allergies  Patient Measurements: Height: 5\' 6"  (167.6 cm) Weight: 100.6 kg (221 lb 12.5 oz) IBW/kg (Calculated) : 59.3 Heparin Dosing Weight: 81.6 kg  Vital Signs: Temp: 98 F (36.7 C) (09/13 0725) Temp Source: Oral (09/13 0725) BP: 153/75 (09/13 0725) Pulse Rate: 80 (09/13 0725)  Labs: Recent Labs    01/18/21 0048 01/19/21 0148 01/20/21 0146  HGB 7.4* 7.2* 7.2*  HCT 23.8* 22.7* 23.0*  PLT 281 276 316  HEPARINUNFRC 0.50 0.46 0.47  CREATININE 4.25* 3.71* 3.33*     Estimated Creatinine Clearance: 18.5 mL/min (A) (by C-G formula based on SCr of 3.33 mg/dL (H)).  Assessment: 70 year old female who arrested at [facility] with confirmed PE and received alteplase 100mg  on 9/04 @ 0300.  Remains therapeutic on 1200 units/hr, heparin level this AM 0.47, H/H low but stable, no hemoptysis documented today  Goal of Therapy:  Heparin level 0.3-0.7 units/ml Monitor platelets by anticoagulation protocol: Yes   Plan:  Continue heparin gtt at 1200 units/hr Daily heparin level, CBC, s/s bleeding F/u further hemoptysis, renal fxn, ability to transition to PO  Bertis Ruddy, PharmD Clinical Pharmacist ED Pharmacist Phone # [phone] 01/20/2021 7:49 AM

## 2021-01-21 DIAGNOSIS — E1169 Type 2 diabetes mellitus with other specified complication: Secondary | ICD-10-CM

## 2021-01-21 DIAGNOSIS — I10 Essential (primary) hypertension: Secondary | ICD-10-CM

## 2021-01-21 DIAGNOSIS — N179 Acute kidney failure, unspecified: Secondary | ICD-10-CM | POA: Diagnosis not present

## 2021-01-21 DIAGNOSIS — I2699 Other pulmonary embolism without acute cor pulmonale: Secondary | ICD-10-CM | POA: Diagnosis not present

## 2021-01-21 LAB — BASIC METABOLIC PANEL
Anion gap: 8 (ref 5–15)
BUN: 55 mg/dL — ABNORMAL HIGH (ref 8–23)
CO2: 27 mmol/L (ref 22–32)
Calcium: 8.7 mg/dL — ABNORMAL LOW (ref 8.9–10.3)
Chloride: 99 mmol/L (ref 98–111)
Creatinine, Ser: 3.06 mg/dL — ABNORMAL HIGH (ref 0.44–1.00)
GFR, Estimated: 16 mL/min — ABNORMAL LOW (ref >60)
Glucose, Bld: 194 mg/dL — ABNORMAL HIGH (ref 70–99)
Potassium: 4.6 mmol/L (ref 3.5–5.1)
Sodium: 134 mmol/L — ABNORMAL LOW (ref 135–145)

## 2021-01-21 LAB — CBC
HCT: 24.4 % — ABNORMAL LOW (ref 36.0–46.0)
Hemoglobin: 7.6 g/dL — ABNORMAL LOW (ref 12.0–15.0)
MCH: 26.8 pg (ref 26.0–34.0)
MCHC: 31.1 g/dL (ref 30.0–36.0)
MCV: 85.9 fL (ref 80.0–100.0)
Platelets: 335 10*3/uL (ref 150–400)
RBC: 2.84 MIL/uL — ABNORMAL LOW (ref 3.87–5.11)
RDW: 14.4 % (ref 11.5–15.5)
WBC: 13.6 10*3/uL — ABNORMAL HIGH (ref 4.0–10.5)
nRBC: 0.4 % — ABNORMAL HIGH (ref 0.0–0.2)

## 2021-01-21 LAB — GLUCOSE, CAPILLARY
Glucose-Capillary: 188 mg/dL — ABNORMAL HIGH (ref 70–99)
Glucose-Capillary: 173 mg/dL — ABNORMAL HIGH (ref 70–99)
Glucose-Capillary: 201 mg/dL — ABNORMAL HIGH (ref 70–99)
Glucose-Capillary: 185 mg/dL — ABNORMAL HIGH (ref 70–99)
Glucose-Capillary: 210 mg/dL — ABNORMAL HIGH (ref 70–99)

## 2021-01-21 LAB — HEPARIN LEVEL (UNFRACTIONATED): Heparin Unfractionated: 0.58 IU/mL (ref 0.30–0.70)

## 2021-01-21 MED ORDER — PANTOPRAZOLE SODIUM 40 MG PO TBEC
40.0000 mg | DELAYED_RELEASE_TABLET | Freq: Every day | ORAL | Status: DC
  Administered 2021-01-21 – 2021-01-26 (×6): 40 mg via ORAL
  Filled 2021-01-21 (×7): qty 1

## 2021-01-21 NOTE — Progress Notes (Signed)
PROGRESS NOTE    Kathleen [name]   KWI:[id number]  DOB: December 11, 1950  DOA: 01/10/2021 PCP: Fayrene Helper, MD   Brief Narrative:  Kathleen [name] is a 70 year old female with CKD stage IIIb, essential hypertension, diabetes mellitus type 2, unprovoked PE who was recommended to discontinue Eliquis in 7/22.  She presented to [facility] with dyspnea and dizziness and was found to have a recurrent PE with cardiac strain.  She developed a cardiac arrest and required thrombolytics.  ROSC was achieved after about 20 minutes.  She was intubated and started on pressors and transferred to [facility].   Subjective: She has no complaints today    Assessment & Plan:   Principal Problem:   Acute massive pulmonary embolism, acute respiratory failure with hypoxia and hypercarbia Cardiac arrest -Bilateral PE with cardiac strain/cor pulmonale -history of unprovoked PE in June 2021 -100 mg of tPA given during CPR - 2D echo: EF 50 to 24%, grade 1 diastolic dysfunction with moderate LVH, McConnell sign is present consistent with acute cor pulmonale, moderate RV systolic dysfunction, moderately elevated pulmonary artery systolic pressure, moderate to severe tricuspid regurgitation - Venous duplex of lower extremity did not reveal any DVT -Continues to require heparin infusion-we will be able to transition to Eliquis once GFR improves to greater than 20 mL/min -Patient was also treated for acute bronchitis with a course of azithromycin and prednisone which she completed yesterday  Active Problems: AKI secondary to ATN-CKD stage IIIb Metabolic acidosis - Due to cardiogenic shock, IV contrast in setting of lisinopril use - Baseline creatinine is about 1.6 - Creatinine rose to about 5.75 on 01/15/2021 and has slowly been improving  Chest wall pain secondary to CPR - Continue supportive treatment    Type 2 diabetes mellitus, diabetic neuropathy Hemoglobin A1C    Component Value Date/Time    HGBA1C 7.6 (H) 01/12/2021 0930   HGBA1C 7.6 (A) 11/17/2020 1435      Morbid obesity  Body mass index is 35.69 kg/m.    Essential hypertension -Continue to hold lisinopril and spironolactone until renal function returns to baseline Continue amlodipine and clonidine  History of thyroid nodule - Outpatient follow-up with PCP recommended  GERD - Continue PPI   Time spent in minutes: 3 5 DVT prophylaxis:  Heparin infusion Code Status: Full code Family Communication:  Level of Care: Level of care: Telemetry Cardiac Disposition Plan:  Status is: Inpatient  Remains inpatient appropriate because:IV treatments appropriate due to intensity of illness or inability to take PO  Dispo: The patient is from: Home              Anticipated d/c is to: Physical therapy recommending CIR              Patient currently is not medically stable to d/c.   Difficult to place patient No      Consultants:  Pulmonary critical care Procedures:  TPA CPR Antimicrobials:  Anti-infectives (From admission, onward)    Start     Dose/Rate Route Frequency Ordered Stop   01/16/21 1000  azithromycin (ZITHROMAX) tablet 500 mg        500 mg Oral Daily 01/16/21 0824 01/20/21 0845        Objective: Vitals:   01/20/21 2341 01/21/21 0504 01/21/21 0746 01/21/21 1147  BP: 124/62 (!) 141/66 (!) 154/67 (!) 144/65  Pulse: 75 91 87 85  Resp: 15 20 19  (!) 21  Temp: 98.7 F (37.1 C) 98.4 F (36.9 C) 98.6 F (  37 C) 98.7 F (37.1 C)  TempSrc: Oral Oral Oral Oral  SpO2: 99% 96% 98% 98%  Weight:  100.3 kg    Height:        Intake/Output Summary (Last 24 hours) at 01/21/2021 1602 Last data filed at 01/21/2021 0807 Gross per 24 hour  Intake 240 ml  Output 1100 ml  Net -860 ml   Filed Weights   01/17/21 0441 01/20/21 0414 01/21/21 0504  Weight: 104.7 kg 100.6 kg 100.3 kg    Examination: General exam: Appears comfortable  HEENT: PERRLA, oral mucosa moist, no sclera icterus or  thrush Respiratory system: Clear to auscultation. Respiratory effort normal. Cardiovascular system: S1 & S2 heard, RRR.   Gastrointestinal system: Abdomen soft, non-tender, nondistended. Normal bowel sounds. Central nervous system: Alert and oriented. No focal neurological deficits. Extremities: No cyanosis, clubbing or edema Skin: No rashes or ulcers Psychiatry:  Mood & affect appropriate.     Data Reviewed: I have personally reviewed following labs and imaging studies  CBC: Recent Labs  Lab 01/17/21 0106 01/18/21 0048 01/19/21 0148 01/20/21 0146 01/21/21 0207  WBC 15.7* 14.4* 13.0* 11.5* 13.6*  HGB 7.3* 7.4* 7.2* 7.2* 7.6*  HCT 22.7* 23.8* 22.7* 23.0* 24.4*  MCV 84.7 85.3 85.7 85.8 85.9  PLT 241 281 276 316 623   Basic Metabolic Panel: Recent Labs  Lab 01/16/21 0204 01/17/21 0106 01/18/21 0048 01/19/21 0148 01/20/21 0146 01/21/21 0808  NA 133* 134* 136 135 133* 134*  K 5.5* 5.2* 5.0 5.8* 4.1 4.6  CL 97* 100 101 100 98 99  CO2 23 26 29 27 28 27   GLUCOSE 183* 206* 183* 182* 206* 194*  BUN 70* 74* 74* 77* 64* 55*  CREATININE 5.39* 4.81* 4.25* 3.71* 3.33* 3.06*  CALCIUM 8.8* 8.7* 8.6* 8.5* 8.3* 8.7*  PHOS 4.9* 4.0 3.6 3.5  --   --    GFR: Estimated Creatinine Clearance: 20.2 mL/min (A) (by C-G formula based on SCr of 3.06 mg/dL (H)). Liver Function Tests: Recent Labs  Lab 01/16/21 0204 01/17/21 0106 01/18/21 0048 01/19/21 0148  ALBUMIN 2.7* 2.5* 2.5* 2.3*   No results for input(s): LIPASE, AMYLASE in the last 168 hours. No results for input(s): AMMONIA in the last 168 hours. Coagulation Profile: No results for input(s): INR, PROTIME in the last 168 hours. Cardiac Enzymes: No results for input(s): CKTOTAL, CKMB, CKMBINDEX, TROPONINI in the last 168 hours. BNP (last 3 results) No results for input(s): PROBNP in the last 8760 hours. HbA1C: No results for input(s): HGBA1C in the last 72 hours. CBG: Recent Labs  Lab 01/20/21 1655 01/20/21 2126  01/21/21 0610 01/21/21 0800 01/21/21 1149  GLUCAP 169* 201* 188* 173* 201*   Lipid Profile: No results for input(s): CHOL, HDL, LDLCALC, TRIG, CHOLHDL, LDLDIRECT in the last 72 hours. Thyroid Function Tests: No results for input(s): TSH, T4TOTAL, FREET4, T3FREE, THYROIDAB in the last 72 hours. Anemia Panel: No results for input(s): VITAMINB12, FOLATE, FERRITIN, TIBC, IRON, RETICCTPCT in the last 72 hours. Urine analysis:    Component Value Date/Time   COLORURINE YELLOW 01/13/2021 1630   APPEARANCEUR CLOUDY (A) 01/13/2021 1630   LABSPEC 1.015 01/13/2021 1630   PHURINE 6.0 01/13/2021 1630   GLUCOSEU NEGATIVE 01/13/2021 1630   HGBUR SMALL (A) 01/13/2021 1630   BILIRUBINUR NEGATIVE 01/13/2021 1630   KETONESUR NEGATIVE 01/13/2021 1630   PROTEINUR NEGATIVE 01/13/2021 1630   NITRITE NEGATIVE 01/13/2021 1630   LEUKOCYTESUR SMALL (A) 01/13/2021 1630   Sepsis Labs: @LABRCNTIP (procalcitonin:4,lacticidven:4) ) Recent Results (from the past  240 hour(s))  Respiratory (~20 pathogens) panel by PCR     Status: None   Collection Time: 01/16/21  1:19 PM   Specimen: Nasopharyngeal Swab; Respiratory  Result Value Ref Range Status   Adenovirus NOT DETECTED NOT DETECTED Final   Coronavirus 229E NOT DETECTED NOT DETECTED Final    Comment: (NOTE) The Coronavirus on the Respiratory Panel, DOES NOT test for the novel  Coronavirus (2019 nCoV)    Coronavirus HKU1 NOT DETECTED NOT DETECTED Final   Coronavirus NL63 NOT DETECTED NOT DETECTED Final   Coronavirus OC43 NOT DETECTED NOT DETECTED Final   Metapneumovirus NOT DETECTED NOT DETECTED Final   Rhinovirus / Enterovirus NOT DETECTED NOT DETECTED Final   Influenza A NOT DETECTED NOT DETECTED Final   Influenza B NOT DETECTED NOT DETECTED Final   Parainfluenza Virus 1 NOT DETECTED NOT DETECTED Final   Parainfluenza Virus 2 NOT DETECTED NOT DETECTED Final   Parainfluenza Virus 3 NOT DETECTED NOT DETECTED Final   Parainfluenza Virus 4 NOT DETECTED  NOT DETECTED Final   Respiratory Syncytial Virus NOT DETECTED NOT DETECTED Final   Bordetella pertussis NOT DETECTED NOT DETECTED Final   Bordetella Parapertussis NOT DETECTED NOT DETECTED Final   Chlamydophila pneumoniae NOT DETECTED NOT DETECTED Final   Mycoplasma pneumoniae NOT DETECTED NOT DETECTED Final    Comment: Performed at [facility], [address], [place], [place] [address]         Radiology Studies: No results found.    Scheduled Meds:  amLODipine  10 mg Oral Daily   aspirin EC  81 mg Oral Daily   carvedilol  6.25 mg Oral BID WC   cholecalciferol  1,000 Units Oral Daily   cloNIDine  0.1 mg Oral Daily   insulin aspart  0-20 Units Subcutaneous TID WC   insulin aspart  0-5 Units Subcutaneous QHS   mouth rinse  15 mL Mouth Rinse BID   pantoprazole  40 mg Oral QHS   Continuous Infusions:  sodium chloride Stopped (01/11/21 0813)   heparin 1,200 Units/hr (01/21/21 0811)     LOS: 10 days      Debbe Odea, MD [facility] Pager: [email or web address] 01/21/2021, 4:02 PM

## 2021-01-21 NOTE — Progress Notes (Signed)
Occupational Therapy Treatment Patient Details Name: Kathleen [name] MRN: [id number] DOB: 04-29-1951 Today's Date: 01/21/2021   History of present illness Pt is a 70 y.o. female admitted 01/10/21 with c/o dizziness and SOB. While in CT, pt went into cardiac arrest x3 requiring ~30-min CPR with ROSC. ETT 9/3-9/5. Head CT negative for acute abnormality. CTA showed bilateral PE with RV strain. PMH includes HTN, DM2, CKD 3, PE.   OT comments  Pt. Refused to sit eob for adls secondary to chest pain. Pt. Nurse is aware and have pain medicine. Pt. Was able to assist with adls supine with hob elevated. Pt. Was able to assist with rolling. Acute OT to follow.    Recommendations for follow up therapy are one component of a multi-disciplinary discharge planning process, led by the attending physician.  Recommendations may be updated based on patient status, additional functional criteria and insurance authorization.    Follow Up Recommendations  CIR    Equipment Recommendations   (to be decided at dc location)    Recommendations for Other Services      Precautions / Restrictions Precautions Precautions: Fall Precaution Comments: chest discomfort due to prolonged CPR, on 2LO2 watch sats Restrictions Weight Bearing Restrictions: No       Mobility Bed Mobility Overal bed mobility: Needs Assistance   Rolling: Supervision              Transfers                      Balance                                           ADL either performed or assessed with clinical judgement   ADL Overall ADL's : Needs assistance/impaired Eating/Feeding: Independent;Sitting   Grooming: Bed level;Wash/dry hands;Wash/dry face;Applying deodorant;Set up   Upper Body Bathing: Minimal assistance;Bed level   Lower Body Bathing: Maximal assistance;Bed level   Upper Body Dressing : Minimal assistance;Bed level   Lower Body Dressing: Maximal assistance;Bed level                Functional mobility during ADLs:  (Pt. refused to sit eob secondary to chest pain. pt. nurse aware and gave medicine prior to session.) General ADL Comments: Pt. refused to sit eob or to get oob.     Vision   Vision Assessment?: No apparent visual deficits   Perception     Praxis      Cognition Arousal/Alertness: Awake/alert Behavior During Therapy: WFL for tasks assessed/performed;Flat affect Overall Cognitive Status: Within Functional Limits for tasks assessed                         Following Commands: Follows one step commands consistently                Exercises     Shoulder Instructions       General Comments      Pertinent Vitals/ Pain       Pain Assessment: 0-10 Pain Score: 8  Pain Location: chest (pain from cpr) Pain Descriptors / Indicators: Sore Pain Intervention(s): Premedicated before session  Home Living  Prior Functioning/Environment              Frequency  Min 2X/week        Progress Toward Goals  OT Goals(current goals can now be found in the care plan section)  Progress towards OT goals: Progressing toward goals  Acute Rehab OT Goals Patient Stated Goal: get better OT Goal Formulation: With patient Time For Goal Achievement: 02/01/21 ADL Goals Pt Will Perform Grooming: with min guard assist;standing Pt Will Perform Upper Body Bathing: with set-up;sitting Pt Will Perform Lower Body Bathing: with min guard assist;sit to/from stand Pt Will Perform Upper Body Dressing: with set-up;sitting Pt Will Perform Lower Body Dressing: with min assist;sit to/from stand Pt Will Transfer to Toilet: with min guard assist;ambulating;grab bars Pt Will Perform Toileting - Clothing Manipulation and hygiene: with min guard assist;sit to/from stand Pt Will Perform Tub/Shower Transfer: with min assist;ambulating;shower seat  Plan Discharge plan remains appropriate     Co-evaluation                 AM-PAC OT "6 Clicks" Daily Activity     Outcome Measure   Help from another person eating meals?: None Help from another person taking care of personal grooming?: A Little Help from another person toileting, which includes using toliet, bedpan, or urinal?: A Lot Help from another person bathing (including washing, rinsing, drying)?: A Lot Help from another person to put on and taking off regular upper body clothing?: A Little Help from another person to put on and taking off regular lower body clothing?: A Lot 6 Click Score: 16    End of Session    OT Visit Diagnosis: Unsteadiness on feet (R26.81);Other abnormalities of gait and mobility (R26.89);Muscle weakness (generalized) (M62.81);Pain   Activity Tolerance Patient limited by pain   Patient Left in bed;with call bell/phone within reach;with bed alarm set;with nursing/sitter in room   Nurse Communication  (ok therapy)        Time: 6945-0388 OT Time Calculation (min): 43 min  Charges: OT General Charges $OT Visit: 1 Visit OT Treatments $Self Care/Home Management : 38-52 mins  Reece Packer OT/L   [place] 01/21/2021, 11:10 AM

## 2021-01-21 NOTE — Progress Notes (Signed)
Physical Therapy Treatment Patient Details Name: Kathleen [name] MRN: [id number] DOB: August 20, 1950 Today's Date: 01/21/2021   History of Present Illness Pt is a 70 y.o. female admitted 01/10/21 with c/o dizziness and SOB. While in CT, pt went into cardiac arrest x3 requiring ~30-min CPR with ROSC. ETT 9/3-9/5. Head CT negative for acute abnormality. CTA showed massive bilateral PE with RV strain. PMH includes HTN, DM2, CKD 3, PE.    PT Comments    Pt requiring encouragement/education to participate with therapy due to reports of feeling bad and chest soreness today but demonstrated good progress and tolerated well.  She ambulated 8'x2 with min A of 2 and chair follow - easily fatigued.  Cues for exercises and transfer techniques.  Continue plan of care.     Recommendations for follow up therapy are one component of a multi-disciplinary discharge planning process, led by the attending physician.  Recommendations may be updated based on patient status, additional functional criteria and insurance authorization.  Follow Up Recommendations  CIR;Supervision for mobility/OOB     Equipment Recommendations  Rolling walker with 5" wheels;3in1 (PT)    Recommendations for Other Services       Precautions / Restrictions Precautions Precautions: Fall Precaution Comments: chest discomfort due to prolonged CPR Restrictions Weight Bearing Restrictions: No     Mobility  Bed Mobility Overal bed mobility: Needs Assistance Bed Mobility: Supine to Sit Rolling: Supervision Sidelying to sit: Min assist;HOB elevated       General bed mobility comments: Pt lifting trunk and getting feet off bed from elevated position but required assist to scoot forwars    Transfers Overall transfer level: Needs assistance Equipment used: Rolling walker (2 wheeled) Transfers: Sit to/from Stand Sit to Stand: Mod assist;From elevated surface;+2 safety/equipment         General transfer comment: Performed sit  to stand from bed and chair requiring heavy mod A and cues for hand placement and leaning forward  Ambulation/Gait Ambulation/Gait assistance: Min assist;+2 safety/equipment Gait Distance (Feet): 8 Feet (8'x2) Assistive device: Rolling walker (2 wheeled) Gait Pattern/deviations: Step-to pattern;Trunk flexed;Wide base of support Gait velocity: Decreased   General Gait Details: Pt ambulated 8' with min A of 2 for safety and chair follow.  Requiring assist to steady and guide RW with cues for RW and posture.  Seated rest break then ambulated 8' back to bed   Stairs             Wheelchair Mobility    Modified Rankin (Stroke Patients Only)       Balance Overall balance assessment: Needs assistance Sitting-balance support: No upper extremity supported;Feet supported Sitting balance-Leahy Scale: Good     Standing balance support: Bilateral upper extremity supported;During functional activity Standing balance-Leahy Scale: Poor Standing balance comment: Requiring RW and at least min guard.  Upon standing pt with slight posterior lean and when therapist removing purewick she sat back down.  Stood again and was able to maintain balance with min guard-min A at times.                            Cognition Arousal/Alertness: Awake/alert Behavior During Therapy: Flat affect Overall Cognitive Status: Within Functional Limits for tasks assessed                         Following Commands: Follows one step commands consistently  Exercises General Exercises - Lower Extremity Ankle Circles/Pumps: AROM;Both;10 reps;Seated Long Arc Quad: AROM;Both;10 reps;Seated Hip Flexion/Marching: AROM;Both;10 reps;Seated    General Comments        Pertinent Vitals/Pain Pain Assessment: 0-10 Pain Score: 6  Pain Location: chest (soreness from CPR and coughing) Pain Descriptors / Indicators: Sore Pain Intervention(s): Limited activity within patient's  tolerance;Monitored during session    Home Living                      Prior Function            PT Goals (current goals can now be found in the care plan section) Acute Rehab PT Goals Patient Stated Goal: get better Progress towards PT goals: Progressing toward goals    Frequency    Min 3X/week      PT Plan Current plan remains appropriate    Co-evaluation              AM-PAC PT "6 Clicks" Mobility   Outcome Measure  Help needed turning from your back to your side while in a flat bed without using bedrails?: A Little Help needed moving from lying on your back to sitting on the side of a flat bed without using bedrails?: A Lot Help needed moving to and from a bed to a chair (including a wheelchair)?: A Lot Help needed standing up from a chair using your arms (e.g., wheelchair or bedside chair)?: A Lot Help needed to walk in hospital room?: A Lot Help needed climbing 3-5 steps with a railing? : Total 6 Click Score: 12    End of Session Equipment Utilized During Treatment: Gait belt Activity Tolerance: Patient tolerated treatment well Patient left: with call bell/phone within reach;in bed;with bed alarm set Nurse Communication: Mobility status PT Visit Diagnosis: Muscle weakness (generalized) (M62.81);Other abnormalities of gait and mobility (R26.89);Pain     Time: 9191-6606 PT Time Calculation (min) (ACUTE ONLY): 30 min  Charges:  $Gait Training: 8-22 mins $Therapeutic Activity: 8-22 mins                     Kathleen [name], PT Acute Rehab Services Pager [phone] [facility] [place] 01/21/2021, 1:51 PM

## 2021-01-21 NOTE — Progress Notes (Signed)
Inpatient Rehab Admissions Coordinator:   I do not have insurance auth or a bed for this pt. Today. I continue to await insurance auth for CIR. Pt. Updated.  Clemens Catholic, [place], [place] Admissions Coordinator  [phone] ([place]) [phone] (office)

## 2021-01-21 NOTE — Progress Notes (Signed)
ANTICOAGULATION CONSULT NOTE - Follow Up Consult  Pharmacy Consult for heparin Indication: pulmonary embolus  No Known Allergies  Patient Measurements: Height: 5\' 6"  (167.6 cm) Weight: 100.3 kg (221 lb 1.9 oz) IBW/kg (Calculated) : 59.3 Heparin Dosing Weight: 81.6 kg  Vital Signs: Temp: 98.4 F (36.9 C) (09/14 0504) Temp Source: Oral (09/14 0504) BP: 141/66 (09/14 0504) Pulse Rate: 91 (09/14 0504)  Labs: Recent Labs    01/19/21 0148 01/20/21 0146 01/21/21 0207  HGB 7.2* 7.2* 7.6*  HCT 22.7* 23.0* 24.4*  PLT 276 316 335  HEPARINUNFRC 0.46 0.47 0.58  CREATININE 3.71* 3.33*  --      Estimated Creatinine Clearance: 18.5 mL/min (A) (by C-G formula based on SCr of 3.33 mg/dL (H)).  Assessment: 70 year old female who arrested at [facility] with confirmed PE and received alteplase 100mg  on 9/04 @ 0300.  Remains therapeutic on 1200 units/hr, heparin level this AM 0.46, H/H low but stable, no hemoptysis documented today  Goal of Therapy:  Heparin level 0.3-0.7 units/ml Monitor platelets by anticoagulation protocol: Yes   Plan:  Continue heparin gtt at 1200 units/hr Daily heparin level, CBC, s/s bleeding F/u further hemoptysis, renal fxn, ability to transition to PO  Thank you Anette Guarneri, PharmD 01/21/2021 7:21 AM

## 2021-01-22 ENCOUNTER — Ambulatory Visit: Payer: Medicare Other | Admitting: Family Medicine

## 2021-01-22 DIAGNOSIS — N179 Acute kidney failure, unspecified: Secondary | ICD-10-CM | POA: Diagnosis not present

## 2021-01-22 DIAGNOSIS — I10 Essential (primary) hypertension: Secondary | ICD-10-CM | POA: Diagnosis not present

## 2021-01-22 DIAGNOSIS — I2699 Other pulmonary embolism without acute cor pulmonale: Secondary | ICD-10-CM | POA: Diagnosis not present

## 2021-01-22 LAB — BASIC METABOLIC PANEL
Anion gap: 10 (ref 5–15)
BUN: 46 mg/dL — ABNORMAL HIGH (ref 8–23)
CO2: 26 mmol/L (ref 22–32)
Calcium: 8.7 mg/dL — ABNORMAL LOW (ref 8.9–10.3)
Chloride: 98 mmol/L (ref 98–111)
Creatinine, Ser: 2.87 mg/dL — ABNORMAL HIGH (ref 0.44–1.00)
GFR, Estimated: 17 mL/min — ABNORMAL LOW (ref >60)
Glucose, Bld: 170 mg/dL — ABNORMAL HIGH (ref 70–99)
Potassium: 4.6 mmol/L (ref 3.5–5.1)
Sodium: 134 mmol/L — ABNORMAL LOW (ref 135–145)

## 2021-01-22 LAB — CBC
HCT: 23.6 % — ABNORMAL LOW (ref 36.0–46.0)
Hemoglobin: 7.4 g/dL — ABNORMAL LOW (ref 12.0–15.0)
MCH: 26.9 pg (ref 26.0–34.0)
MCHC: 31.4 g/dL (ref 30.0–36.0)
MCV: 85.8 fL (ref 80.0–100.0)
Platelets: 381 10*3/uL (ref 150–400)
RBC: 2.75 MIL/uL — ABNORMAL LOW (ref 3.87–5.11)
RDW: 14.6 % (ref 11.5–15.5)
WBC: 13.6 10*3/uL — ABNORMAL HIGH (ref 4.0–10.5)
nRBC: 0.2 % (ref 0.0–0.2)

## 2021-01-22 LAB — FERRITIN: Ferritin: 245 ng/mL (ref 11–307)

## 2021-01-22 LAB — GLUCOSE, CAPILLARY
Glucose-Capillary: 164 mg/dL — ABNORMAL HIGH (ref 70–99)
Glucose-Capillary: 231 mg/dL — ABNORMAL HIGH (ref 70–99)
Glucose-Capillary: 219 mg/dL — ABNORMAL HIGH (ref 70–99)
Glucose-Capillary: 152 mg/dL — ABNORMAL HIGH (ref 70–99)

## 2021-01-22 LAB — RETICULOCYTES
Immature Retic Fract: 15.3 % (ref 2.3–15.9)
RBC.: 2.99 MIL/uL — ABNORMAL LOW (ref 3.87–5.11)
Retic Count, Absolute: 110.6 10*3/uL (ref 19.0–186.0)
Retic Ct Pct: 3.7 % — ABNORMAL HIGH (ref 0.4–3.1)

## 2021-01-22 LAB — VITAMIN B12: Vitamin B-12: 426 pg/mL (ref 180–914)

## 2021-01-22 LAB — HEPARIN LEVEL (UNFRACTIONATED): Heparin Unfractionated: 0.42 IU/mL (ref 0.30–0.70)

## 2021-01-22 LAB — ABO/RH: ABO/RH(D): O POS

## 2021-01-22 LAB — IRON AND TIBC
Iron: 53 ug/dL (ref 28–170)
Saturation Ratios: 17 % (ref 10.4–31.8)
TIBC: 304 ug/dL (ref 250–450)
UIBC: 251 ug/dL

## 2021-01-22 LAB — FOLATE: Folate: 6.2 ng/mL (ref >5.9)

## 2021-01-22 LAB — PREPARE RBC (CROSSMATCH)

## 2021-01-22 MED ORDER — DEXTROMETHORPHAN POLISTIREX ER 30 MG/5ML PO SUER
30.0000 mg | Freq: Two times a day (BID) | ORAL | Status: DC
  Administered 2021-01-22 – 2021-01-27 (×11): 30 mg via ORAL
  Filled 2021-01-22 (×12): qty 5

## 2021-01-22 MED ORDER — SODIUM CHLORIDE 0.9% IV SOLUTION: Freq: Once | INTRAVENOUS | Status: AC

## 2021-01-22 MED ORDER — ACETAMINOPHEN 325 MG PO TABS
650.0000 mg | ORAL_TABLET | Freq: Four times a day (QID) | ORAL | Status: DC
  Administered 2021-01-22 – 2021-01-27 (×20): 650 mg via ORAL
  Filled 2021-01-22 (×21): qty 2

## 2021-01-22 NOTE — Progress Notes (Addendum)
IP rehab admissions - I have received a denial for acute inpatient rehab admission.  I will speak with patient to see if she would like to appeal the decision.  I will update all after I talk with patient. Call for questions.  [phone]  I spoke with patient about insurance denial.  Patient would like to pursue SNF across the street from [facility].  She says that she has been there before and it is closer to her family.  I will not pursue an expedited appeal at this time.

## 2021-01-22 NOTE — Progress Notes (Signed)
PROGRESS NOTE    Kathleen [name]   UXL:[id number]  DOB: 04-29-1951  DOA: 01/10/2021 PCP: Fayrene Helper, MD   Brief Narrative:  Kathleen [name] is a 70 year old female with CKD stage IIIb, essential hypertension, diabetes mellitus type 2, unprovoked PE who was recommended to discontinue Eliquis in 7/22.  She presented to [facility] with dyspnea and dizziness and was found to have a recurrent PE with cardiac strain.  She developed a cardiac arrest and required thrombolytics.  ROSC was achieved after about 20 minutes.  She was intubated and started on pressors and transferred to [facility].   Subjective: She complains of pain in her feet which is limiting her mobility. No noted swelling in feet or ankles. She continues to have mild hemoptysis and is asking for a cough suppressant.     Assessment & Plan:   Principal Problem:   Acute massive pulmonary embolism, acute respiratory failure with hypoxia and hypercarbia Cardiac arrest -Bilateral PE with cardiac strain/cor pulmonale -history of unprovoked PE in June 2021 -100 mg of tPA given during CPR - 2D echo: EF 50 to 53%, grade 1 diastolic dysfunction with moderate LVH, McConnell sign is present consistent with acute cor pulmonale, moderate RV systolic dysfunction, moderately elevated pulmonary artery systolic pressure, moderate to severe tricuspid regurgitation - Venous duplex of lower extremity did not reveal any DVT -Continues to require heparin infusion-we will be able to transition to Eliquis once GFR improves to greater than 20 mL/min -Patient was also treated for acute bronchitis with a course of azithromycin and prednisone which she completed on 9/13  Active Problems: AKI secondary to ATN-CKD stage IIIb Metabolic acidosis - Due to cardiogenic shock, IV contrast in setting of lisinopril use - Baseline creatinine is about 1.6 - Creatinine rose to about 5.75 on 01/15/2021 and has slowly been improving  Anemia   -  Hgb 10.5 in 9/22- has drifted down to 7.4 - Anemia panel is consistent with AOCD - as she is short of breath on exertion (partly due to PE) and she has had a rapid drop in Hgb, I will transfuse 1 U PRBC today  Chest wall pain secondary to CPR - Continue supportive treatment    Type 2 diabetes mellitus, diabetic neuropathy Hemoglobin A1C    Component Value Date/Time   HGBA1C 7.6 (H) 01/12/2021 0930   HGBA1C 7.6 (A) 11/17/2020 1435      Morbid obesity  Body mass index is 36.12 kg/m.    Essential hypertension -Continue to hold lisinopril and spironolactone until renal function returns to baseline Continue amlodipine and clonidine  History of thyroid nodule - Outpatient follow-up with PCP recommended  GERD - Continue PPI  Pain in b/l feet and ankles - noted to be tender on exam but no swelling- follow- can give her QID tylenol   Time spent in minutes: 3 5 DVT prophylaxis:  Heparin infusion Code Status: Full code Family Communication:  Level of Care: Level of care: Telemetry Cardiac Disposition Plan:  Status is: Inpatient  Remains inpatient appropriate because:IV treatments appropriate due to intensity of illness or inability to take PO  Dispo: The patient is from: Home              Anticipated d/c is to: Physical therapy recommending CIR              Patient currently is not medically stable to d/c.   Difficult to place patient No  Consultants:  Pulmonary critical care Procedures:  TPA  CPR Antimicrobials:  Anti-infectives (From admission, onward)    Start     Dose/Rate Route Frequency Ordered Stop   01/16/21 1000  azithromycin (ZITHROMAX) tablet 500 mg        500 mg Oral Daily 01/16/21 0824 01/20/21 0845        Objective: Vitals:   01/21/21 2348 01/22/21 0353 01/22/21 0809 01/22/21 1157  BP: (!) 151/65 (!) 153/75 (!) 158/62 (!) 143/67  Pulse: 93 90 88 82  Resp: 15 (!) 21 20 20   Temp: 98.3 F (36.8 C) 98.7 F (37.1 C) 98.5 F (36.9 C) 98.8 F (37.1  C)  TempSrc: Oral Oral Oral Oral  SpO2: 96% 94% 95% 95%  Weight:  101.5 kg    Height:        Intake/Output Summary (Last 24 hours) at 01/22/2021 1327 Last data filed at 01/22/2021 1200 Gross per 24 hour  Intake 840 ml  Output 3500 ml  Net -2660 ml    Filed Weights   01/20/21 0414 01/21/21 0504 01/22/21 0353  Weight: 100.6 kg 100.3 kg 101.5 kg    Examination: General exam: Appears comfortable  HEENT: PERRLA, oral mucosa moist, no sclera icterus or thrush Respiratory system: Clear to auscultation. Respiratory effort normal. Cardiovascular system: S1 & S2 heard, regular rate and rhythm Gastrointestinal system: Abdomen soft, non-tender, nondistended. Normal bowel sounds   Central nervous system: Alert and oriented. No focal neurological deficits. Extremities: No cyanosis, clubbing or edema- tenderness in lower legs, ankles and feet   Skin: No rashes or ulcers Psychiatry:  Mood & affect appropriate.      Data Reviewed: I have personally reviewed following labs and imaging studies  CBC: Recent Labs  Lab 01/18/21 0048 01/19/21 0148 01/20/21 0146 01/21/21 0207 01/22/21 0221  WBC 14.4* 13.0* 11.5* 13.6* 13.6*  HGB 7.4* 7.2* 7.2* 7.6* 7.4*  HCT 23.8* 22.7* 23.0* 24.4* 23.6*  MCV 85.3 85.7 85.8 85.9 85.8  PLT 281 276 316 335 413    Basic Metabolic Panel: Recent Labs  Lab 01/16/21 0204 01/17/21 0106 01/18/21 0048 01/19/21 0148 01/20/21 0146 01/21/21 0808 01/22/21 0816  NA 133* 134* 136 135 133* 134* 134*  K 5.5* 5.2* 5.0 5.8* 4.1 4.6 4.6  CL 97* 100 101 100 98 99 98  CO2 23 26 29 27 28 27 26   GLUCOSE 183* 206* 183* 182* 206* 194* 170*  BUN 70* 74* 74* 77* 64* 55* 46*  CREATININE 5.39* 4.81* 4.25* 3.71* 3.33* 3.06* 2.87*  CALCIUM 8.8* 8.7* 8.6* 8.5* 8.3* 8.7* 8.7*  PHOS 4.9* 4.0 3.6 3.5  --   --   --     GFR: Estimated Creatinine Clearance: 21.6 mL/min (A) (by C-G formula based on SCr of 2.87 mg/dL (H)). Liver Function Tests: Recent Labs  Lab  01/16/21 0204 01/17/21 0106 01/18/21 0048 01/19/21 0148  ALBUMIN 2.7* 2.5* 2.5* 2.3*      Radiology Studies: No results found.    Scheduled Meds:  acetaminophen  650 mg Oral QID   amLODipine  10 mg Oral Daily   aspirin EC  81 mg Oral Daily   carvedilol  6.25 mg Oral BID WC   cholecalciferol  1,000 Units Oral Daily   cloNIDine  0.1 mg Oral Daily   dextromethorphan  30 mg Oral BID   insulin aspart  0-20 Units Subcutaneous TID WC   insulin aspart  0-5 Units Subcutaneous QHS   mouth rinse  15 mL Mouth Rinse BID   pantoprazole  40 mg Oral QHS  Continuous Infusions:  sodium chloride Stopped (01/11/21 0813)   heparin 1,200 Units/hr (01/21/21 0811)     LOS: 11 days      Debbe Odea, MD [facility] Pager: [email or web address] 01/22/2021, 1:27 PM

## 2021-01-22 NOTE — Progress Notes (Signed)
ANTICOAGULATION CONSULT NOTE - Follow Up Consult  Pharmacy Consult for heparin Indication: pulmonary embolus  No Known Allergies  Patient Measurements: Height: 5\' 6"  (167.6 cm) Weight: 101.5 kg (223 lb 12.3 oz) IBW/kg (Calculated) : 59.3 Heparin Dosing Weight: 81.6 kg  Vital Signs: Temp: 98.7 F (37.1 C) (09/15 0353) Temp Source: Oral (09/15 0353) BP: 153/75 (09/15 0353) Pulse Rate: 90 (09/15 0353)  Labs: Recent Labs    01/20/21 0146 01/21/21 0207 01/21/21 0808 01/22/21 0221  HGB 7.2* 7.6*  --  7.4*  HCT 23.0* 24.4*  --  23.6*  PLT 316 335  --  381  HEPARINUNFRC 0.47 0.58  --  0.42  CREATININE 3.33*  --  3.06*  --      Estimated Creatinine Clearance: 20.3 mL/min (A) (by C-G formula based on SCr of 3.06 mg/dL (H)).  Assessment: 70 year old female who arrested at [facility] with confirmed PE and received alteplase 100mg  on 9/04 @ 0300.  Remains therapeutic on 1200 units/hr  Goal of Therapy:  Heparin level 0.3-0.7 units/ml Monitor platelets by anticoagulation protocol: Yes   Plan:  Continue heparin gtt at 1200 units/hr Daily heparin level, CBC, s/s bleeding F/u further hemoptysis, renal fxn, ability to transition to PO  Thank you Anette Guarneri, PharmD 01/22/2021 7:38 AM

## 2021-01-22 NOTE — TOC Initial Note (Signed)
Transition of Care [facility]) - Initial/Assessment Note    Patient Details  Name: Kathleen [name] MRN: [id number] Date of Birth: Sep 20, 1950  Transition [facility] CM/SW Contact:    Vinie Sill, LCSW Phone Number: 01/22/2021, 4:35 PM  Clinical Narrative:                  CSW met with patient at bedside. CSW introduced self and explained role. Patient confirmed her insurance denied CIR- patient states she is agreeable to short term rehab at [facility]. Preferred SNF is in [place]. CSW explained the SNF process. Patient states received covid vaccine and booster shots. Patient states no questions or concerns.   CSW will provide bed offers once available CSW will start insurance authorization tomorrow  CSW will continue to follow and assist with discharge planning.  Thurmond Butts, MSW, LCSW Clinical Social Worker    Expected Discharge Plan: Skilled Nursing Facility Barriers to Discharge: Ship broker, SNF Pending bed offer, Continued Medical Work up   Patient Goals and CMS Choice        Expected Discharge Plan and Services Expected Discharge Plan: [place] In-house Referral: Clinical Social Work                                            Prior Living Arrangements/Services   Lives with:: Self Patient language and need for interpreter reviewed:: No        Need for Family Participation in Patient Care: Yes (Comment) Care giver support system in place?: Yes (comment)   Criminal Activity/Legal Involvement Pertinent to Current Situation/Hospitalization: No - Comment as needed  Activities of Daily Living Home Assistive Devices/Equipment: Cane (specify quad or straight) ADL Screening (condition at time of admission) Patient's cognitive ability adequate to safely complete daily activities?: Yes Is the patient deaf or have difficulty hearing?: No Does the patient have difficulty seeing, even when wearing glasses/contacts?: Yes Does the  patient have difficulty concentrating, remembering, or making decisions?: No Patient able to express need for assistance with ADLs?: Yes Does the patient have difficulty dressing or bathing?: Yes Independently performs ADLs?: No Communication: Appropriate for developmental age Dressing (OT): Needs assistance Is this a change from baseline?: Change from baseline, expected to last >3 days Grooming: Needs assistance Is this a change from baseline?: Change from baseline, expected to last >3 days Feeding: Independent Bathing: Dependent Is this a change from baseline?: Change from baseline, expected to last >3 days Toileting: Dependent, Needs assistance Is this a change from baseline?: Change from baseline, expected to last >3days In/Out Bed: Dependent, Needs assistance Is this a change from baseline?: Change from baseline, expected to last >3 days Walks in Home: Independent Does the patient have difficulty walking or climbing stairs?: Yes Weakness of Legs: Both Weakness of Arms/Hands: Both  Permission Sought/Granted Permission sought to share information with : Family Supports Permission granted to share information with : Yes, Verbal Permission Granted  Share Information with NAME: Publishing rights manager  Permission granted to share info w AGENCY: SNFs  Permission granted to share info w Relationship: son  Permission granted to share info w Contact Information: [phone]  Emotional Assessment Appearance:: Appears stated age Attitude/Demeanor/Rapport: Engaged Affect (typically observed): Accepting, Appropriate, Pleasant Orientation: : Oriented to Self, Oriented to Place, Oriented to  Time, Oriented to Situation Alcohol / Substance Use: Not Applicable Psych Involvement: No (comment)  Admission diagnosis:  Acute massive pulmonary embolism (HCC) [I26.99] Patient Active Problem List   Diagnosis Date Noted   Acute massive pulmonary embolism ([place]) 01/11/2021   Acute respiratory failure with  hypercapnia (HCC)    Shock ([place])    AKI (acute kidney injury) ([place])    Pain in finger of left hand 11/17/2020   Left hand pain 08/05/2020   Sleep apnea 11/25/2019   Abnormal CT scan, chest 11/25/2019   Thyroid nodule 11/25/2019   Pulmonary embolism ([place]) 10/22/2019   Chronic kidney disease, stage 3 unspecified ([place]) 05/11/2019   Oxygen dependent 05/11/2019   Finger deformity, acquired 04/03/2018   Knee pain, right 03/19/2018   Seasonal allergies 09/17/2013   Unilateral primary osteoarthritis, left knee 01/11/2012   Morbid obesity ([place]) 02/03/2009   Hyperlipidemia 11/24/2007   Type 2 diabetes mellitus ([place]) 06/09/2007   Essential hypertension 04/26/2007   GERD 04/26/2007   PCP:  Fayrene Helper, MD Pharmacy:   [facility] [place] [facility] [place] [address] Phone: [phone] Fax: [phone]     Social Determinants of Health (SDOH) Interventions    Readmission Risk Interventions No flowsheet data found.

## 2021-01-22 NOTE — Progress Notes (Addendum)
Mobility Specialist Progress Note   01/22/21 1244  Mobility  Activity Ambulated in room  Level of Assistance Moderate assist, patient does 50-74%  Assistive Device Front wheel walker;Other (Comment) (Chair Follow)  Distance Ambulated (ft) 14 ft  Mobility Ambulated with assistance in room  Mobility Response Tolerated fair  Mobility performed by Mobility specialist  Bed Position Chair  $Mobility charge 1 Mobility   Pt received sitting up in bed w/ no c/o sx. Agreeable to mobility session. Mod independent supine < > sit , heavy modA sit < > stand. Pt required +2 assistance for chair follow requiring two seated breaks, one after 55ft and then 92ft for a total of 66ft. d/t weakness, dizziness and extreme fatigue. Pt transferred to [facility] after returning to the room for bath with NT.     Pre Mobility: 80 HR, 147/65 BP, 96% SpO2 During Mobility: 80 HR, 154/80 BP, 93% SpO2 Post Mobility: 84 HR  Tacey Heap Phone Number [phone]

## 2021-01-23 ENCOUNTER — Other Ambulatory Visit (HOSPITAL_COMMUNITY): Payer: Self-pay

## 2021-01-23 DIAGNOSIS — I2699 Other pulmonary embolism without acute cor pulmonale: Secondary | ICD-10-CM | POA: Diagnosis not present

## 2021-01-23 DIAGNOSIS — N179 Acute kidney failure, unspecified: Secondary | ICD-10-CM | POA: Diagnosis not present

## 2021-01-23 LAB — BASIC METABOLIC PANEL
Anion gap: 8 (ref 5–15)
BUN: 39 mg/dL — ABNORMAL HIGH (ref 8–23)
CO2: 25 mmol/L (ref 22–32)
Calcium: 8.4 mg/dL — ABNORMAL LOW (ref 8.9–10.3)
Chloride: 100 mmol/L (ref 98–111)
Creatinine, Ser: 2.76 mg/dL — ABNORMAL HIGH (ref 0.44–1.00)
GFR, Estimated: 18 mL/min — ABNORMAL LOW (ref >60)
Glucose, Bld: 182 mg/dL — ABNORMAL HIGH (ref 70–99)
Potassium: 4.6 mmol/L (ref 3.5–5.1)
Sodium: 133 mmol/L — ABNORMAL LOW (ref 135–145)

## 2021-01-23 LAB — BPAM RBC
Blood Product Expiration Date: 202210092359
ISSUE DATE / TIME: 202209151842
Unit Type and Rh: 5100

## 2021-01-23 LAB — TYPE AND SCREEN
ABO/RH(D): O POS
Antibody Screen: NEGATIVE
Unit division: 0

## 2021-01-23 LAB — GLUCOSE, CAPILLARY
Glucose-Capillary: 159 mg/dL — ABNORMAL HIGH (ref 70–99)
Glucose-Capillary: 221 mg/dL — ABNORMAL HIGH (ref 70–99)
Glucose-Capillary: 252 mg/dL — ABNORMAL HIGH (ref 70–99)
Glucose-Capillary: 208 mg/dL — ABNORMAL HIGH (ref 70–99)

## 2021-01-23 LAB — CBC
HCT: 27.1 % — ABNORMAL LOW (ref 36.0–46.0)
Hemoglobin: 8.6 g/dL — ABNORMAL LOW (ref 12.0–15.0)
MCH: 27.7 pg (ref 26.0–34.0)
MCHC: 31.7 g/dL (ref 30.0–36.0)
MCV: 87.1 fL (ref 80.0–100.0)
Platelets: 392 10*3/uL (ref 150–400)
RBC: 3.11 MIL/uL — ABNORMAL LOW (ref 3.87–5.11)
RDW: 14.6 % (ref 11.5–15.5)
WBC: 12 10*3/uL — ABNORMAL HIGH (ref 4.0–10.5)
nRBC: 0.2 % (ref 0.0–0.2)

## 2021-01-23 LAB — HEPARIN LEVEL (UNFRACTIONATED): Heparin Unfractionated: 0.4 IU/mL (ref 0.30–0.70)

## 2021-01-23 NOTE — Progress Notes (Signed)
Physical Therapy Treatment Patient Details Name: Kathleen [name] MRN: [id number] DOB: 09-Oct-1950 Today's Date: 01/23/2021   History of Present Illness Pt is a 70 y.o. female admitted 01/10/21 with c/o dizziness and SOB. While in CT, pt went into cardiac arrest x3 requiring ~30-min CPR with ROSC. ETT 9/3-9/5. Head CT negative for acute abnormality. CTA showed massive bilateral PE with RV strain. PMH includes HTN, DM2, CKD 3, PE.   PT Comments    Pt progressing with mobility. Today's session focused on transfer and gait training with RW, pt requiring consistent modA to stand from various surface heights. Pt remains limited by generalized weakness, decreased activity tolerance, and impaired balance strategies/postural reactions. Noted insurance declined CIR, therefore recommending SNF-level therapies to maximize functional mobility and independence prior to return home; pt in agreement.    Recommendations for follow up therapy are one component of a multi-disciplinary discharge planning process, led by the attending physician.  Recommendations may be updated based on patient status, additional functional criteria and insurance authorization.  Follow Up Recommendations  SNF;Supervision for mobility/OOB (insurance declined CIR)     Equipment Recommendations  Rolling walker with 5" wheels;3in1 (PT)    Recommendations for Other Services       Precautions / Restrictions Precautions Precautions: Fall Restrictions Weight Bearing Restrictions: No     Mobility  Bed Mobility Overal bed mobility: Needs Assistance Bed Mobility: Supine to Sit     Supine to sit: Supervision;HOB elevated     General bed mobility comments: Reliant on bed rail to scoot hips to EOB    Transfers Overall transfer level: Needs assistance Equipment used: Rolling walker (2 wheeled) Transfers: Sit to/from Stand Sit to Stand: Mod assist         General transfer comment: Reliant on momentum and heavy modA to  power into standing from EOB to RW; additional stand from recliner, heavy reliance on UE support to push into standing, modA for trunk elevation  Ambulation/Gait Ambulation/Gait assistance: Min assist;Min guard Gait Distance (Feet): 44 Feet Assistive device: Rolling walker (2 wheeled) Gait Pattern/deviations: Step-to pattern;Trunk flexed;Wide base of support;Antalgic Gait velocity: Decreased   General Gait Details: Slow, antalgic gait with RW and intermittent minA for stability; cues to maintain proximity to RW, cues for activity pacing; 1x standing rest break secondary to fatigue and SOB   Stairs             Wheelchair Mobility    Modified Rankin (Stroke Patients Only)       Balance Overall balance assessment: Needs assistance Sitting-balance support: No upper extremity supported;Feet supported Sitting balance-Leahy Scale: Good     Standing balance support: Bilateral upper extremity supported;During functional activity Standing balance-Leahy Scale: Poor Standing balance comment: Reliant on UE support                            Cognition Arousal/Alertness: Awake/alert Behavior During Therapy: Flat affect Overall Cognitive Status: Within Functional Limits for tasks assessed                                        Exercises      General Comments General comments (skin integrity, edema, etc.): Pt denies dizziness this session; HR 80s-100s      Pertinent Vitals/Pain Pain Assessment: Faces Faces Pain Scale: Hurts a little bit Pain Location: feet Pain Descriptors / Indicators: Sore Pain Intervention(s):  Monitored during session    Home Living                      Prior Function            PT Goals (current goals can now be found in the care plan section) Progress towards PT goals: Progressing toward goals    Frequency    Min 2X/week      PT Plan Discharge plan needs to be updated;Frequency needs to be updated     Co-evaluation              AM-PAC PT "6 Clicks" Mobility   Outcome Measure  Help needed turning from your back to your side while in a flat bed without using bedrails?: A Little Help needed moving from lying on your back to sitting on the side of a flat bed without using bedrails?: A Lot Help needed moving to and from a bed to a chair (including a wheelchair)?: A Lot Help needed standing up from a chair using your arms (e.g., wheelchair or bedside chair)?: A Lot Help needed to walk in hospital room?: A Little Help needed climbing 3-5 steps with a railing? : Total 6 Click Score: 13    End of Session Equipment Utilized During Treatment: Gait belt Activity Tolerance: Patient tolerated treatment well Patient left: in chair;with call bell/phone within reach;with chair alarm set;with family/visitor present Nurse Communication: Mobility status PT Visit Diagnosis: Muscle weakness (generalized) (M62.81);Other abnormalities of gait and mobility (R26.89);Pain     Time: 1062-6948 PT Time Calculation (min) (ACUTE ONLY): 20 min  Charges:  $Therapeutic Activity: 8-22 mins                     Mabeline Caras, PT, DPT Acute Rehabilitation Services  Pager [phone] Office [place] 01/23/2021, 3:26 PM

## 2021-01-23 NOTE — Progress Notes (Signed)
ANTICOAGULATION CONSULT NOTE - Follow Up Consult  Pharmacy Consult for heparin Indication: pulmonary embolus  No Known Allergies  Patient Measurements: Height: 5\' 6"  (167.6 cm) Weight: 101.5 kg (223 lb 12.3 oz) IBW/kg (Calculated) : 59.3 Heparin Dosing Weight: 81.6 kg  Vital Signs: Temp: 98.7 F (37.1 C) (09/16 0333) Temp Source: Oral (09/16 0333) BP: 147/88 (09/16 0333) Pulse Rate: 87 (09/16 0333)  Labs: Recent Labs    01/21/21 0207 01/21/21 0808 01/22/21 0221 01/22/21 0816 01/23/21 0052  HGB 7.6*  --  7.4*  --  8.6*  HCT 24.4*  --  23.6*  --  27.1*  PLT 335  --  381  --  392  HEPARINUNFRC 0.58  --  0.42  --  0.40  CREATININE  --  3.06*  --  2.87*  --      Estimated Creatinine Clearance: 21.6 mL/min (A) (by C-G formula based on SCr of 2.87 mg/dL (H)).  Assessment: 70 year old female who arrested at [facility] with confirmed PE and received alteplase 100mg  on 9/04 @ 0300.  Remains therapeutic on 1200 units/hr  Goal of Therapy:  Heparin level 0.3-0.7 units/ml Monitor platelets by anticoagulation protocol: Yes   Plan:  Continue heparin gtt at 1200 units/hr Daily heparin level, CBC, s/s bleeding Transition to Eliquis soon  Thank you Anette Guarneri, PharmD 01/23/2021 7:48 AM

## 2021-01-23 NOTE — Progress Notes (Signed)
PROGRESS NOTE    Kathleen [name]   VQM:[id number]  DOB: Mar 25, 1951  DOA: 01/10/2021 PCP: Fayrene Helper, MD   Brief Narrative:  Kathleen [name] is a 70 year old female with CKD stage IIIb, essential hypertension, diabetes mellitus type 2, unprovoked PE who was recommended to discontinue Eliquis in 7/22.  She presented to [facility] with dyspnea and dizziness and was found to have a recurrent PE with cardiac strain.  She developed a cardiac arrest and required thrombolytics.  ROSC was achieved after about 20 minutes.  She was intubated and started on pressors and transferred to [facility].   Subjective: She has less pain in her ankles and feet today. Coughing up less blood now.      Assessment & Plan:   Principal Problem:   Acute massive pulmonary embolism, acute respiratory failure with hypoxia and hypercarbia Cardiac arrest -Bilateral PE with cardiac strain/cor pulmonale -history of unprovoked PE in June 2021 -100 mg of tPA given during CPR - 2D echo: EF 50 to 93%, grade 1 diastolic dysfunction with moderate LVH, McConnell sign is present consistent with acute cor pulmonale, moderate RV systolic dysfunction, moderately elevated pulmonary artery systolic pressure, moderate to severe tricuspid regurgitation - Venous duplex of lower extremity did not reveal any DVT -Continues to require heparin infusion-we will be able to transition to Eliquis once GFR improves to greater than 20 mL/min -Patient was also treated for acute bronchitis with a course of azithromycin and prednisone which she completed on 9/13  Active Problems: AKI secondary to ATN-CKD stage IIIb Metabolic acidosis - Due to cardiogenic shock, IV contrast in setting of lisinopril use - Baseline creatinine is about 1.6 - Creatinine rose to about 5.75 on 01/15/2021 and has slowly been improving  Anemia   - Hgb 10.5 in 9/22- has drifted down to 7.4 - Anemia panel is consistent with AOCD - 9/15>  transfused  1 U PRBC  - Hgb improved to 8.6  Chest wall pain secondary to CPR - Continue supportive treatment    Type 2 diabetes mellitus, diabetic neuropathy Hemoglobin A1C    Component Value Date/Time   HGBA1C 7.6 (H) 01/12/2021 0930   HGBA1C 7.6 (A) 11/17/2020 1435      Morbid obesity  Body mass index is 36.12 kg/m.    Essential hypertension -Continue to hold lisinopril and spironolactone until renal function returns to baseline Continue amlodipine and clonidine  History of thyroid nodule - Outpatient follow-up with PCP recommended  GERD - Continue PPI  Pain in b/l feet and ankles - noted to be tender on exam but no swelling- follow- can give her QID tylenol   Time spent in minutes: 3 5 DVT prophylaxis:  Heparin infusion Code Status: Full code Family Communication:  Level of Care: Level of care: Telemetry Cardiac Disposition Plan:  Status is: Inpatient  Remains inpatient appropriate because:IV treatments appropriate due to intensity of illness or inability to take PO  Dispo: The patient is from: Home              Anticipated d/c is to: Physical therapy recommending CIR- Insurance had denied this and we are now pursuing SNF              Patient currently is not medically stable to d/c.   Difficult to place patient No  Consultants:  Pulmonary critical care Procedures:  TPA CPR Antimicrobials:  Anti-infectives (From admission, onward)    Start     Dose/Rate Route Frequency Ordered Stop  01/16/21 1000  azithromycin (ZITHROMAX) tablet 500 mg        500 mg Oral Daily 01/16/21 0824 01/20/21 0845        Objective: Vitals:   01/22/21 2325 01/23/21 0333 01/23/21 0747 01/23/21 1215  BP: (!) 159/67 (!) 147/88 139/71 (!) 158/74  Pulse: 77 87    Resp: 17 17 19 16   Temp: 98.9 F (37.2 C) 98.7 F (37.1 C) 98.9 F (37.2 C) 98.7 F (37.1 C)  TempSrc: Oral Oral Oral Oral  SpO2: 95% 98% 98% 93%  Weight:      Height:        Intake/Output Summary (Last 24 hours) at  01/23/2021 1602 Last data filed at 01/23/2021 1557 Gross per 24 hour  Intake 934.17 ml  Output 1250 ml  Net -315.83 ml    Filed Weights   01/20/21 0414 01/21/21 0504 01/22/21 0353  Weight: 100.6 kg 100.3 kg 101.5 kg    Examination: General exam: Appears comfortable  HEENT: PERRLA, oral mucosa moist, no sclera icterus or thrush Respiratory system: Clear to auscultation. Respiratory effort normal. Cardiovascular system: S1 & S2 heard, regular rate and rhythm Gastrointestinal system: Abdomen soft, non-tender, nondistended. Normal bowel sounds   Central nervous system: Alert and oriented. No focal neurological deficits. Extremities: No cyanosis, clubbing or edema Skin: No rashes or ulcers Psychiatry:  Mood & affect appropriate.      Data Reviewed: I have personally reviewed following labs and imaging studies  CBC: Recent Labs  Lab 01/19/21 0148 01/20/21 0146 01/21/21 0207 01/22/21 0221 01/23/21 0052  WBC 13.0* 11.5* 13.6* 13.6* 12.0*  HGB 7.2* 7.2* 7.6* 7.4* 8.6*  HCT 22.7* 23.0* 24.4* 23.6* 27.1*  MCV 85.7 85.8 85.9 85.8 87.1  PLT 276 316 335 381 353    Basic Metabolic Panel: Recent Labs  Lab 01/17/21 0106 01/18/21 0048 01/19/21 0148 01/20/21 0146 01/21/21 0808 01/22/21 0816 01/23/21 0900  NA 134* 136 135 133* 134* 134* 133*  K 5.2* 5.0 5.8* 4.1 4.6 4.6 4.6  CL 100 101 100 98 99 98 100  CO2 26 29 27 28 27 26 25   GLUCOSE 206* 183* 182* 206* 194* 170* 182*  BUN 74* 74* 77* 64* 55* 46* 39*  CREATININE 4.81* 4.25* 3.71* 3.33* 3.06* 2.87* 2.76*  CALCIUM 8.7* 8.6* 8.5* 8.3* 8.7* 8.7* 8.4*  PHOS 4.0 3.6 3.5  --   --   --   --     GFR: Estimated Creatinine Clearance: 22.5 mL/min (A) (by C-G formula based on SCr of 2.76 mg/dL (H)). Liver Function Tests: Recent Labs  Lab 01/17/21 0106 01/18/21 0048 01/19/21 0148  ALBUMIN 2.5* 2.5* 2.3*      Radiology Studies: No results found.    Scheduled Meds:  acetaminophen  650 mg Oral QID   amLODipine  10 mg  Oral Daily   aspirin EC  81 mg Oral Daily   carvedilol  6.25 mg Oral BID WC   cholecalciferol  1,000 Units Oral Daily   cloNIDine  0.1 mg Oral Daily   dextromethorphan  30 mg Oral BID   insulin aspart  0-20 Units Subcutaneous TID WC   insulin aspart  0-5 Units Subcutaneous QHS   mouth rinse  15 mL Mouth Rinse BID   pantoprazole  40 mg Oral QHS   Continuous Infusions:  sodium chloride Stopped (01/11/21 0813)   heparin 1,200 Units/hr (01/23/21 0747)     LOS: 12 days      Debbe Odea, MD [facility] Pager: [email or web address] 01/23/2021,  4:02 PM

## 2021-01-23 NOTE — TOC Progression Note (Signed)
Transition of Care [facility]) - Progression Note    Patient Details  Name: Kathleen [name] MRN: [id number] Date of Birth: [birth year]  Transition of Care [facility]) CM/SW [place], [place] Phone Number: 01/23/2021, 3:03 PM  Clinical Narrative:     CSW met with patient - informed of bed offers. Patient was agreeable to [facility].  [facility]/Debbie confirmed  bed offer- informed SNF, anticipated d/c tomorrow. CSW started insurance authorization- pending  Informed MD- covid test needed  TOC will continue to follow and assist with discharge planning.   Thurmond Butts, MSW, LCSW Clinical Social Worker    Expected Discharge Plan: Skilled Nursing Facility Barriers to Discharge: Ship broker, SNF Pending bed offer, Continued Medical Work up  Expected Discharge Plan and Services Expected Discharge Plan: [place] In-house Referral: Clinical Social Work                                             Social Determinants of Health (SDOH) Interventions    Readmission Risk Interventions No flowsheet data found.

## 2021-01-23 NOTE — NC FL2 (Signed)
[place] LEVEL OF CARE SCREENING TOOL     IDENTIFICATION  Patient Name: Kathleen [name] Birthdate: 25-Sep-1950 Sex: female Admission Date (Current Location): 01/10/2021  [facility] and [place] Number:  Herbalist and Address:  [facility] [place], [place] [address] [place], [place] [address]      Provider Number: [id number]  Attending Physician Name and Address:  Debbe Odea, MD  Relative Name and Phone Number:       Current Level of Care: Hospital Recommended Level of Care: [place] Prior Approval Number:    Date Approved/Denied:   PASRR Number: [id number]  Discharge Plan: SNF    Current Diagnoses: Patient Active Problem List   Diagnosis Date Noted   Acute massive pulmonary embolism ([place]) 01/11/2021   Acute respiratory failure with hypercapnia (HCC)    Shock ([place])    AKI (acute kidney injury) ([place])    Pain in finger of left hand 11/17/2020   Left hand pain 08/05/2020   Sleep apnea 11/25/2019   Abnormal CT scan, chest 11/25/2019   Thyroid nodule 11/25/2019   Pulmonary embolism ([place]) 10/22/2019   Chronic kidney disease, stage 3 unspecified ([place]) 05/11/2019   Oxygen dependent 05/11/2019   Finger deformity, acquired 04/03/2018   Knee pain, right 03/19/2018   Seasonal allergies 09/17/2013   Unilateral primary osteoarthritis, left knee 01/11/2012   Morbid obesity ([place]) 02/03/2009   Hyperlipidemia 11/24/2007   Type 2 diabetes mellitus ([place]) 06/09/2007   Essential hypertension 04/26/2007   GERD 04/26/2007    Orientation RESPIRATION BLADDER Height & Weight     Self, Time, Situation, Place  Normal External catheter, Continent Weight: 223 lb 12.3 oz (101.5 kg) Height:  5\' 6"  (167.6 cm)  BEHAVIORAL SYMPTOMS/MOOD NEUROLOGICAL BOWEL NUTRITION STATUS      Continent Diet (please see discharge summary)  AMBULATORY STATUS COMMUNICATION OF NEEDS Skin     Verbally Normal                       Personal Care  Assistance Level of Assistance  Bathing, Dressing, Feeding Bathing Assistance: Limited assistance Feeding assistance: Limited assistance Dressing Assistance: Limited assistance     Functional Limitations Info  Sight, Hearing, Speech Sight Info: Adequate Hearing Info: Adequate Speech Info: Adequate    SPECIAL CARE FACTORS FREQUENCY  PT (By licensed PT), OT (By licensed OT)     PT Frequency: 5x per week OT Frequency: 5x per week            Contractures Contractures Info: Not present    Additional Factors Info  Code Status, Allergies Code Status Info: FULL Allergies Info: NKA           Current Medications (01/23/2021):  This is the current hospital active medication list Current Facility-Administered Medications  Medication Dose Route Frequency Provider Last Rate Last Admin   0.9 %  sodium chloride infusion  250 mL Intravenous Continuous Estill Cotta, NP   Held at 01/11/21 0813   acetaminophen (TYLENOL) tablet 650 mg  650 mg Oral QID Debbe Odea, MD   650 mg at 01/23/21 0825   albuterol (PROVENTIL) (2.5 MG/3ML) 0.083% nebulizer solution 2.5 mg  2.5 mg Nebulization Q2H PRN Charlynne Cousins, MD       amLODipine (NORVASC) tablet 10 mg  10 mg Oral Daily Chesley Mires, MD   10 mg at 01/23/21 0825   aspirin EC tablet 81 mg  81 mg Oral Daily Chesley Mires, MD  81 mg at 01/23/21 0825   carvedilol (COREG) tablet 6.25 mg  6.25 mg Oral BID WC Rosita Fire, MD   6.25 mg at 01/23/21 0825   cholecalciferol (VITAMIN D3) tablet 1,000 Units  1,000 Units Oral Daily Chesley Mires, MD   1,000 Units at 01/23/21 0825   cloNIDine (CATAPRES) tablet 0.1 mg  0.1 mg Oral Daily Charlynne Cousins, MD   0.1 mg at 01/23/21 0825   dextromethorphan (DELSYM) 30 MG/5ML liquid 30 mg  30 mg Oral BID Debbe Odea, MD   30 mg at 01/23/21 0833   heparin ADULT infusion 100 units/mL (25000 units/282mL)  1,200 Units/hr Intravenous Continuous Lyndee Leo, RPH 12 mL/hr at 01/23/21 0747 1,200  Units/hr at 01/23/21 0747   insulin aspart (novoLOG) injection 0-20 Units  0-20 Units Subcutaneous TID WC Chesley Mires, MD   4 Units at 01/23/21 0745   insulin aspart (novoLOG) injection 0-5 Units  0-5 Units Subcutaneous QHS Chesley Mires, MD   2 Units at 01/21/21 2217   ipratropium-albuterol (DUONEB) 0.5-2.5 (3) MG/3ML nebulizer solution 3 mL  3 mL Nebulization Q4H PRN Freddi Starr, MD   3 mL at 01/15/21 1310   MEDLINE mouth rinse  15 mL Mouth Rinse BID Chesley Mires, MD   15 mL at 01/23/21 0827   ondansetron (ZOFRAN) injection 4 mg  4 mg Intravenous Q6H PRN Collier Bullock, MD   4 mg at 01/20/21 1401   pantoprazole (PROTONIX) EC tablet 40 mg  40 mg Oral QHS Debbe Odea, MD   40 mg at 01/22/21 2319   polyethylene glycol (MIRALAX / GLYCOLAX) packet 17 g  17 g Per Tube Daily PRN Chesley Mires, MD   17 g at 01/20/21 0848     Discharge Medications: Please see discharge summary for a list of discharge medications.  Relevant Imaging Results:  Relevant Lab Results:   Additional Information SSN [id number] Moderna COVID-19 Vaccine 06/06/2020 , 08/28/2019 , 07/26/2019  Vinie Sill, LCSW

## 2021-01-24 DIAGNOSIS — N179 Acute kidney failure, unspecified: Secondary | ICD-10-CM | POA: Diagnosis not present

## 2021-01-24 DIAGNOSIS — I2699 Other pulmonary embolism without acute cor pulmonale: Secondary | ICD-10-CM | POA: Diagnosis not present

## 2021-01-24 LAB — CBC
HCT: 26.3 % — ABNORMAL LOW (ref 36.0–46.0)
Hemoglobin: 8.4 g/dL — ABNORMAL LOW (ref 12.0–15.0)
MCH: 27.9 pg (ref 26.0–34.0)
MCHC: 31.9 g/dL (ref 30.0–36.0)
MCV: 87.4 fL (ref 80.0–100.0)
Platelets: 383 10*3/uL (ref 150–400)
RBC: 3.01 MIL/uL — ABNORMAL LOW (ref 3.87–5.11)
RDW: 14.7 % (ref 11.5–15.5)
WBC: 11.2 10*3/uL — ABNORMAL HIGH (ref 4.0–10.5)
nRBC: 0 % (ref 0.0–0.2)

## 2021-01-24 LAB — GLUCOSE, CAPILLARY
Glucose-Capillary: 173 mg/dL — ABNORMAL HIGH (ref 70–99)
Glucose-Capillary: 203 mg/dL — ABNORMAL HIGH (ref 70–99)
Glucose-Capillary: 171 mg/dL — ABNORMAL HIGH (ref 70–99)
Glucose-Capillary: 214 mg/dL — ABNORMAL HIGH (ref 70–99)

## 2021-01-24 LAB — HEPARIN LEVEL (UNFRACTIONATED)
Heparin Unfractionated: 0.13 IU/mL — ABNORMAL LOW (ref 0.30–0.70)
Heparin Unfractionated: 0.32 IU/mL (ref 0.30–0.70)

## 2021-01-24 LAB — BASIC METABOLIC PANEL
Anion gap: 10 (ref 5–15)
BUN: 38 mg/dL — ABNORMAL HIGH (ref 8–23)
CO2: 23 mmol/L (ref 22–32)
Calcium: 8.3 mg/dL — ABNORMAL LOW (ref 8.9–10.3)
Chloride: 101 mmol/L (ref 98–111)
Creatinine, Ser: 2.68 mg/dL — ABNORMAL HIGH (ref 0.44–1.00)
GFR, Estimated: 18 mL/min — ABNORMAL LOW (ref >60)
Glucose, Bld: 142 mg/dL — ABNORMAL HIGH (ref 70–99)
Potassium: 4.7 mmol/L (ref 3.5–5.1)
Sodium: 134 mmol/L — ABNORMAL LOW (ref 135–145)

## 2021-01-24 LAB — SARS CORONAVIRUS 2 (TAT 6-24 HRS): SARS Coronavirus 2: NEGATIVE

## 2021-01-24 MED ORDER — ONDANSETRON HCL 4 MG/2ML IJ SOLN: 4.0000 mg | INTRAMUSCULAR | Status: DC | PRN

## 2021-01-24 NOTE — Progress Notes (Signed)
ANTICOAGULATION CONSULT NOTE - Follow Up Consult  Pharmacy Consult for heparin Indication: pulmonary embolus  No Known Allergies  Patient Measurements: Height: 5\' 6"  (167.6 cm) Weight: 101.5 kg (223 lb 12.3 oz) IBW/kg (Calculated) : 59.3 Heparin Dosing Weight: 81.6 kg  Vital Signs: Temp: 98.9 F (37.2 C) (09/17 0331) Temp Source: Oral (09/17 0331) BP: 141/61 (09/17 0331) Pulse Rate: 85 (09/17 0331)  Labs: Recent Labs    01/21/21 0808 01/22/21 0221 01/22/21 0221 01/22/21 0816 01/23/21 0052 01/23/21 0900 01/24/21 0141  HGB  --  7.4*   < >  --  8.6*  --  8.4*  HCT  --  23.6*  --   --  27.1*  --  26.3*  PLT  --  381  --   --  392  --  383  HEPARINUNFRC  --  0.42  --   --  0.40  --  0.13*  CREATININE 3.06*  --   --  2.87*  --  2.76*  --    < > = values in this interval not displayed.     Estimated Creatinine Clearance: 22.5 mL/min (A) (by C-G formula based on SCr of 2.76 mg/dL (H)).  Assessment: 70 year old female who arrested at [facility] with confirmed PE and received alteplase 100mg  on 9/04 @ 0300.  Heparin level down to subtherapeutic (0.13) on gtt at 1200 units/hr. IV heparin line was leaking and RN switched to another site- unsure how long it was leaking - lines changed ~2300. No bleeding noted.  Goal of Therapy:  Heparin level 0.3-0.7 units/ml Monitor platelets by anticoagulation protocol: Yes   Plan:  Continue heparin gtt at 1200 units/hr F/u 8 hr heparin level post restart  Sherlon Handing, PharmD, BCPS Please see [email or web address] for complete clinical pharmacist phone list 01/24/2021 3:36 AM

## 2021-01-24 NOTE — Progress Notes (Addendum)
ANTICOAGULATION CONSULT NOTE - Follow Up Consult  Pharmacy Consult for heparin Indication: pulmonary embolus  No Known Allergies  Patient Measurements: Height: 5\' 6"  (167.6 cm) Weight: 101.5 kg (223 lb 12.3 oz) IBW/kg (Calculated) : 59.3 Heparin Dosing Weight: 81.6 kg  Vital Signs: Temp: 98.9 F (37.2 C) (09/17 0331) Temp Source: Oral (09/17 0331) BP: 141/61 (09/17 0331) Pulse Rate: 85 (09/17 0331)  Labs: Recent Labs    01/22/21 0221 01/22/21 0816 01/23/21 0052 01/23/21 0900 01/24/21 0141 01/24/21 0736  HGB 7.4*  --  8.6*  --  8.4*  --   HCT 23.6*  --  27.1*  --  26.3*  --   PLT 381  --  392  --  383  --   HEPARINUNFRC 0.42  --  0.40  --  0.13* 0.32  CREATININE  --  2.87*  --  2.76*  --   --      Estimated Creatinine Clearance: 22.5 mL/min (A) (by C-G formula based on SCr of 2.76 mg/dL (H)).  Assessment: 70 year old female who arrested at [facility] with confirmed PE and received alteplase 100mg  on 9/04 @ 0300.  Heparin level subtherapeutic overnight due to line leaking. Line was exchanged and new heparin level is 0.32. No bleeding noted. Will slightly increase rate to stay within therapeutic range. Will defer next heparin level to AM labs since previous level was a line error and patient was previously therapeutic. CBC stable.  Goal of Therapy:  Heparin level 0.3-0.7 units/ml Monitor platelets by anticoagulation protocol: Yes   Plan:  Increase heparin gtt to 1250 units/hr Daily heparin level  Thank you for allowing pharmacy to participate in this patient's care.  Reatha Harps, PharmD PGY1 Pharmacy Resident 01/24/2021 8:51 AM Check [email or web address] for unit specific pharmacy number

## 2021-01-24 NOTE — Progress Notes (Signed)
PROGRESS NOTE    Kathleen [name]   BCW:[id number]  DOB: 1950-12-25  DOA: 01/10/2021 PCP: Fayrene Helper, MD   Brief Narrative:  Kathleen [name] is a 70 year old female with CKD stage IIIb, essential hypertension, diabetes mellitus type 2, unprovoked PE who was recommended to discontinue Eliquis in 7/22.  She presented to [facility] with dyspnea and dizziness and was found to have a recurrent PE with cardiac strain.  She developed a cardiac arrest and required thrombolytics.  ROSC was achieved after about 20 minutes.  She was intubated and started on pressors and transferred to [facility].   Subjective: She has no new complaints. She is able to ambulate in the hall now.     Assessment & Plan:   Principal Problem:   Acute massive pulmonary embolism, acute respiratory failure with hypoxia and hypercarbia Cardiac arrest -Bilateral PE with cardiac strain/cor pulmonale -history of unprovoked PE in June 2021 -100 mg of tPA given during CPR - 2D echo: EF 50 to 03%, grade 1 diastolic dysfunction with moderate LVH, McConnell sign is present consistent with acute cor pulmonale, moderate RV systolic dysfunction, moderately elevated pulmonary artery systolic pressure, moderate to severe tricuspid regurgitation - Venous duplex of lower extremity did not reveal any DVT -Continues to require heparin infusion-we will be able to transition to Eliquis once GFR improves to greater than 20 mL/min -Patient was also treated for acute bronchitis with a course of azithromycin and prednisone which she completed on 9/13  Active Problems: AKI secondary to ATN-CKD stage IIIb Metabolic acidosis - Due to cardiogenic shock, IV contrast in setting of lisinopril use - Baseline creatinine is about 1.6 - Creatinine rose to about 5.75 on 01/15/2021 and has slowly been improving - GFR still 18 today  Anemia   - Hgb 10.5 in 9/22- has drifted down to 7.4 - Anemia panel is consistent with AOCD - 9/15>   transfused 1 U PRBC  - Hgb improved to 8.6  Chest wall pain secondary to CPR - Continue supportive treatment- is resolving    Type 2 diabetes mellitus, diabetic neuropathy Hemoglobin A1C    Component Value Date/Time   HGBA1C 7.6 (H) 01/12/2021 0930   HGBA1C 7.6 (A) 11/17/2020 1435      Morbid obesity  Body mass index is 36.12 kg/m.    Essential hypertension -Continue to hold lisinopril and spironolactone until renal function returns to baseline Continue amlodipine and clonidine  History of thyroid nodule - Outpatient follow-up with PCP recommended  GERD - Continue PPI  Pain in b/l feet and ankles - noted to be tender on exam but no swelling- follow- can give her QID tylenol   Time spent in minutes: 3 5 DVT prophylaxis:  Heparin infusion Code Status: Full code Family Communication:  Level of Care: Level of care: Telemetry Cardiac Disposition Plan:  Status is: Inpatient  Remains inpatient appropriate because:IV treatments appropriate due to intensity of illness or inability to take PO  Dispo: The patient is from: Home              Anticipated d/c is to: Physical therapy recommending CIR- Insurance had denied this and we are now pursuing SNF              Patient currently is not medically stable to d/c.   Difficult to place patient No  Consultants:  Pulmonary critical care Procedures:  TPA CPR Antimicrobials:  Anti-infectives (From admission, onward)    Start     Dose/Rate Route  Frequency Ordered Stop   01/16/21 1000  azithromycin (ZITHROMAX) tablet 500 mg        500 mg Oral Daily 01/16/21 0824 01/20/21 0845        Objective: Vitals:   01/23/21 1930 01/23/21 2352 01/24/21 0331 01/24/21 1130  BP: (!) 149/67 136/62 (!) 141/61 (!) 145/66  Pulse: 92 82 85 73  Resp: 20 20 20 20   Temp: 98.9 F (37.2 C) 98.7 F (37.1 C) 98.9 F (37.2 C) 98.6 F (37 C)  TempSrc: Oral Oral Oral Oral  SpO2: 97% 100% 98% 95%  Weight:      Height:        Intake/Output  Summary (Last 24 hours) at 01/24/2021 1517 Last data filed at 01/24/2021 0836 Gross per 24 hour  Intake 480 ml  Output 2250 ml  Net -1770 ml    Filed Weights   01/20/21 0414 01/21/21 0504 01/22/21 0353  Weight: 100.6 kg 100.3 kg 101.5 kg    Examination: General exam: Appears comfortable  HEENT: PERRLA, oral mucosa moist, no sclera icterus or thrush Respiratory system: Clear to auscultation. Respiratory effort normal. Cardiovascular system: S1 & S2 heard, regular rate and rhythm Gastrointestinal system: Abdomen soft, non-tender, nondistended. Normal bowel sounds   Central nervous system: Alert and oriented. No focal neurological deficits. Extremities: No cyanosis, clubbing or edema Skin: No rashes or ulcers Psychiatry:  Mood & affect appropriate.      Data Reviewed: I have personally reviewed following labs and imaging studies  CBC: Recent Labs  Lab 01/20/21 0146 01/21/21 0207 01/22/21 0221 01/23/21 0052 01/24/21 0141  WBC 11.5* 13.6* 13.6* 12.0* 11.2*  HGB 7.2* 7.6* 7.4* 8.6* 8.4*  HCT 23.0* 24.4* 23.6* 27.1* 26.3*  MCV 85.8 85.9 85.8 87.1 87.4  PLT 316 335 381 392 384    Basic Metabolic Panel: Recent Labs  Lab 01/18/21 0048 01/19/21 0148 01/20/21 0146 01/21/21 0808 01/22/21 0816 01/23/21 0900 01/24/21 0736  NA 136 135 133* 134* 134* 133* 134*  K 5.0 5.8* 4.1 4.6 4.6 4.6 4.7  CL 101 100 98 99 98 100 101  CO2 29 27 28 27 26 25 23   GLUCOSE 183* 182* 206* 194* 170* 182* 142*  BUN 74* 77* 64* 55* 46* 39* 38*  CREATININE 4.25* 3.71* 3.33* 3.06* 2.87* 2.76* 2.68*  CALCIUM 8.6* 8.5* 8.3* 8.7* 8.7* 8.4* 8.3*  PHOS 3.6 3.5  --   --   --   --   --     GFR: Estimated Creatinine Clearance: 23.2 mL/min (A) (by C-G formula based on SCr of 2.68 mg/dL (H)). Liver Function Tests: Recent Labs  Lab 01/18/21 0048 01/19/21 0148  ALBUMIN 2.5* 2.3*      Radiology Studies: No results found.    Scheduled Meds:  acetaminophen  650 mg Oral QID   amLODipine  10 mg  Oral Daily   aspirin EC  81 mg Oral Daily   carvedilol  6.25 mg Oral BID WC   cholecalciferol  1,000 Units Oral Daily   cloNIDine  0.1 mg Oral Daily   dextromethorphan  30 mg Oral BID   insulin aspart  0-20 Units Subcutaneous TID WC   insulin aspart  0-5 Units Subcutaneous QHS   mouth rinse  15 mL Mouth Rinse BID   pantoprazole  40 mg Oral QHS   Continuous Infusions:  sodium chloride Stopped (01/11/21 0813)   heparin 1,250 Units/hr (01/24/21 0913)     LOS: 13 days      Debbe Odea, MD [facility] Pager: [email or web address] 01/24/2021, 3:17 PM

## 2021-01-24 NOTE — TOC Progression Note (Signed)
Transition of Care [facility]) - Progression Note    Patient Details  Name: Kathleen [name] MRN: [id number] Date of Birth: 1951-01-16  Transition of Care [facility]) CM/SW [place], [place] Phone Number: [phone] 01/24/2021, 11:58 AM  Clinical Narrative:    Pt authorization is back.  [employer] #- [id number] [employer] #- [id number] Authorization dates: 9/16-9/20 Fax Number: [phone]    MD alerted that pt' authorization is back.  TOC team will continue to assist with discharge planning needs.   Expected Discharge Plan: Skilled Nursing Facility Barriers to Discharge: Ship broker, SNF Pending bed offer, Continued Medical Work up  Expected Discharge Plan and Services Expected Discharge Plan: [place] In-house Referral: Clinical Social Work                                             Social Determinants of Health (SDOH) Interventions    Readmission Risk Interventions No flowsheet data found.

## 2021-01-25 DIAGNOSIS — N179 Acute kidney failure, unspecified: Secondary | ICD-10-CM | POA: Diagnosis not present

## 2021-01-25 DIAGNOSIS — I2699 Other pulmonary embolism without acute cor pulmonale: Secondary | ICD-10-CM | POA: Diagnosis not present

## 2021-01-25 LAB — CBC
HCT: 28.1 % — ABNORMAL LOW (ref 36.0–46.0)
Hemoglobin: 9 g/dL — ABNORMAL LOW (ref 12.0–15.0)
MCH: 28.3 pg (ref 26.0–34.0)
MCHC: 32 g/dL (ref 30.0–36.0)
MCV: 88.4 fL (ref 80.0–100.0)
Platelets: 399 10*3/uL (ref 150–400)
RBC: 3.18 MIL/uL — ABNORMAL LOW (ref 3.87–5.11)
RDW: 14.6 % (ref 11.5–15.5)
WBC: 9.4 10*3/uL (ref 4.0–10.5)
nRBC: 0 % (ref 0.0–0.2)

## 2021-01-25 LAB — BASIC METABOLIC PANEL
Anion gap: 10 (ref 5–15)
BUN: 30 mg/dL — ABNORMAL HIGH (ref 8–23)
CO2: 24 mmol/L (ref 22–32)
Calcium: 8.5 mg/dL — ABNORMAL LOW (ref 8.9–10.3)
Chloride: 101 mmol/L (ref 98–111)
Creatinine, Ser: 2.62 mg/dL — ABNORMAL HIGH (ref 0.44–1.00)
GFR, Estimated: 19 mL/min — ABNORMAL LOW (ref >60)
Glucose, Bld: 159 mg/dL — ABNORMAL HIGH (ref 70–99)
Potassium: 4.6 mmol/L (ref 3.5–5.1)
Sodium: 135 mmol/L (ref 135–145)

## 2021-01-25 LAB — GLUCOSE, CAPILLARY
Glucose-Capillary: 180 mg/dL — ABNORMAL HIGH (ref 70–99)
Glucose-Capillary: 228 mg/dL — ABNORMAL HIGH (ref 70–99)
Glucose-Capillary: 184 mg/dL — ABNORMAL HIGH (ref 70–99)
Glucose-Capillary: 234 mg/dL — ABNORMAL HIGH (ref 70–99)

## 2021-01-25 LAB — HEPARIN LEVEL (UNFRACTIONATED): Heparin Unfractionated: 0.37 IU/mL (ref 0.30–0.70)

## 2021-01-25 NOTE — Progress Notes (Signed)
PROGRESS NOTE    Kathleen [name]   QGB:[id number]  DOB: January 25, 1951  DOA: 01/10/2021 PCP: Fayrene Helper, MD   Brief Narrative:  Kathleen [name] is a 70 year old female with CKD stage IIIb, essential hypertension, diabetes mellitus type 2, unprovoked PE who was recommended to discontinue Eliquis in 7/22.  She presented to [facility] with dyspnea and dizziness and was found to have a recurrent PE with cardiac strain.  She developed a cardiac arrest and required thrombolytics.  ROSC was achieved after about 20 minutes.  She was intubated and started on pressors and transferred to [facility].   Subjective: No complaints.     Assessment & Plan:   Principal Problem:   Acute massive pulmonary embolism, acute respiratory failure with hypoxia and hypercarbia Cardiac arrest -Bilateral PE with cardiac strain/cor pulmonale -history of unprovoked PE in June 2021 -100 mg of tPA given during CPR - 2D echo: EF 50 to 97%, grade 1 diastolic dysfunction with moderate LVH, McConnell sign is present consistent with acute cor pulmonale, moderate RV systolic dysfunction, moderately elevated pulmonary artery systolic pressure, moderate to severe tricuspid regurgitation - Venous duplex of lower extremity did not reveal any DVT -Continues to require heparin infusion-we will be able to transition to Eliquis once GFR improves to greater than 20 mL/min -Patient was also treated for acute bronchitis with a course of azithromycin and prednisone which she completed on 9/13  Active Problems: AKI secondary to ATN-CKD stage IIIb Metabolic acidosis - Due to cardiogenic shock, IV contrast in setting of lisinopril use - Baseline creatinine is about 1.6 - Creatinine rose to about 5.75 on 01/15/2021 and has slowly been improving - GFR 19 today  Anemia   - Hgb 10.5 in 9/22- has drifted down to 7.4 - Anemia panel is consistent with AOCD - 9/15>  transfused 1 U PRBC  - Hgb improved to 8.6  Chest wall  pain secondary to CPR - Continue supportive treatment- is resolving    Type 2 diabetes mellitus, diabetic neuropathy Hemoglobin A1C    Component Value Date/Time   HGBA1C 7.6 (H) 01/12/2021 0930   HGBA1C 7.6 (A) 11/17/2020 1435      Morbid obesity  Body mass index is 36.12 kg/m.    Essential hypertension -Continue to hold lisinopril and spironolactone until renal function returns to baseline Continue amlodipine and clonidine  History of thyroid nodule - Outpatient follow-up with PCP recommended  GERD - Continue PPI  Pain in b/l feet and ankles - resolved   Time spent in minutes: 3 5 DVT prophylaxis:  Heparin infusion Code Status: Full code Family Communication:  Level of Care: Level of care: Telemetry Cardiac Disposition Plan:  Status is: Inpatient  Remains inpatient appropriate because:IV treatments appropriate due to intensity of illness or inability to take PO  Dispo: The patient is from: Home              Anticipated d/c is to: Physical therapy recommending CIR- Insurance had denied this and we are now pursuing SNF              Patient currently is not medically stable to d/c.   Difficult to place patient No  Consultants:  Pulmonary critical care Procedures:  TPA CPR Antimicrobials:  Anti-infectives (From admission, onward)    Start     Dose/Rate Route Frequency Ordered Stop   01/16/21 1000  azithromycin (ZITHROMAX) tablet 500 mg        500 mg Oral Daily 01/16/21 0824 01/20/21  0845        Objective: Vitals:   01/24/21 2103 01/24/21 2358 01/25/21 0300 01/25/21 0813  BP: 136/67 139/68 (!) 143/62 (!) 157/83  Pulse: 78 80 80 91  Resp: 20 19 16 20   Temp: 98.8 F (37.1 C) 99.1 F (37.3 C) 98.5 F (36.9 C) 98.9 F (37.2 C)  TempSrc: Oral Oral Oral Oral  SpO2: 97% 96% 96% 96%  Weight:      Height:        Intake/Output Summary (Last 24 hours) at 01/25/2021 1159 Last data filed at 01/25/2021 0815 Gross per 24 hour  Intake 877.65 ml  Output 1800  ml  Net -922.35 ml    Filed Weights   01/20/21 0414 01/21/21 0504 01/22/21 0353  Weight: 100.6 kg 100.3 kg 101.5 kg    Examination: General exam: Appears comfortable  HEENT: PERRLA, oral mucosa moist, no sclera icterus or thrush Respiratory system: Clear to auscultation. Respiratory effort normal. Cardiovascular system: S1 & S2 heard, regular rate and rhythm Gastrointestinal system: Abdomen soft, non-tender, nondistended. Normal bowel sounds   Central nervous system: Alert and oriented. No focal neurological deficits. Extremities: No cyanosis, clubbing or edema Skin: No rashes or ulcers Psychiatry:  Mood & affect appropriate.      Data Reviewed: I have personally reviewed following labs and imaging studies  CBC: Recent Labs  Lab 01/21/21 0207 01/22/21 0221 01/23/21 0052 01/24/21 0141 01/25/21 0123  WBC 13.6* 13.6* 12.0* 11.2* 9.4  HGB 7.6* 7.4* 8.6* 8.4* 9.0*  HCT 24.4* 23.6* 27.1* 26.3* 28.1*  MCV 85.9 85.8 87.1 87.4 88.4  PLT 335 381 392 383 098    Basic Metabolic Panel: Recent Labs  Lab 01/19/21 0148 01/20/21 0146 01/21/21 0808 01/22/21 0816 01/23/21 0900 01/24/21 0736 01/25/21 0123  NA 135   < > 134* 134* 133* 134* 135  K 5.8*   < > 4.6 4.6 4.6 4.7 4.6  CL 100   < > 99 98 100 101 101  CO2 27   < > 27 26 25 23 24   GLUCOSE 182*   < > 194* 170* 182* 142* 159*  BUN 77*   < > 55* 46* 39* 38* 30*  CREATININE 3.71*   < > 3.06* 2.87* 2.76* 2.68* 2.62*  CALCIUM 8.5*   < > 8.7* 8.7* 8.4* 8.3* 8.5*  PHOS 3.5  --   --   --   --   --   --    < > = values in this interval not displayed.    GFR: Estimated Creatinine Clearance: 23.7 mL/min (A) (by C-G formula based on SCr of 2.62 mg/dL (H)). Liver Function Tests: Recent Labs  Lab 01/19/21 0148  ALBUMIN 2.3*      Radiology Studies: No results found.    Scheduled Meds:  acetaminophen  650 mg Oral QID   amLODipine  10 mg Oral Daily   aspirin EC  81 mg Oral Daily   carvedilol  6.25 mg Oral BID WC    cholecalciferol  1,000 Units Oral Daily   cloNIDine  0.1 mg Oral Daily   dextromethorphan  30 mg Oral BID   insulin aspart  0-20 Units Subcutaneous TID WC   insulin aspart  0-5 Units Subcutaneous QHS   mouth rinse  15 mL Mouth Rinse BID   pantoprazole  40 mg Oral QHS   Continuous Infusions:  sodium chloride Stopped (01/11/21 0813)   heparin 1,250 Units/hr (01/24/21 2240)     LOS: 14 days  Debbe Odea, MD [facility] Pager: [email or web address] 01/25/2021, 11:59 AM

## 2021-01-25 NOTE — Progress Notes (Signed)
ANTICOAGULATION CONSULT NOTE - Follow Up Consult  Pharmacy Consult for heparin Indication: pulmonary embolus  No Known Allergies  Patient Measurements: Height: 5\' 6"  (167.6 cm) Weight: 101.5 kg (223 lb 12.3 oz) IBW/kg (Calculated) : 59.3 Heparin Dosing Weight: 81.6 kg  Vital Signs: Temp: 98.5 F (36.9 C) (09/18 0300) Temp Source: Oral (09/18 0300) BP: 143/62 (09/18 0300) Pulse Rate: 80 (09/18 0300)  Labs: Recent Labs    01/22/21 0816 01/23/21 0052 01/23/21 0052 01/23/21 0900 01/24/21 0141 01/24/21 0736 01/25/21 0123  HGB  --  8.6*   < >  --  8.4*  --  9.0*  HCT  --  27.1*  --   --  26.3*  --  28.1*  PLT  --  392  --   --  383  --  399  HEPARINUNFRC  --  0.40   < >  --  0.13* 0.32 0.37  CREATININE 2.87*  --   --  2.76*  --  2.68*  --    < > = values in this interval not displayed.     Estimated Creatinine Clearance: 23.2 mL/min (A) (by C-G formula based on SCr of 2.68 mg/dL (H)).  Assessment: 70 year old female who arrested at [facility] with confirmed PE and received alteplase 100mg  on 9/04 @ 0300.  Heparin level subtherapeutic overnight due to line leaking. Line was exchanged and new heparin level is 0.37. No bleeding noted. CBC stable. Will follow up plans to transition to [place].  Goal of Therapy:  Heparin level 0.3-0.7 units/ml Monitor platelets by anticoagulation protocol: Yes   Plan:  Continue heparin gtt at 1250 units/hr Daily heparin level  Thank you for allowing pharmacy to participate in this patient's care.  Reatha Harps, PharmD PGY1 Pharmacy Resident 01/25/2021 7:04 AM Check [email or web address] for unit specific pharmacy number

## 2021-01-25 NOTE — Progress Notes (Signed)
Mobility Specialist Progress Note    01/25/21 1554  Mobility  Activity Refused mobility   Pt in bed with son and daughter-in-law present.  Hildred Alamin Mobility Specialist  Mobility Specialist Phone: [phone] '

## 2021-01-26 DIAGNOSIS — I10 Essential (primary) hypertension: Secondary | ICD-10-CM | POA: Diagnosis not present

## 2021-01-26 DIAGNOSIS — N179 Acute kidney failure, unspecified: Secondary | ICD-10-CM | POA: Diagnosis not present

## 2021-01-26 DIAGNOSIS — I2699 Other pulmonary embolism without acute cor pulmonale: Secondary | ICD-10-CM | POA: Diagnosis not present

## 2021-01-26 LAB — BASIC METABOLIC PANEL
Anion gap: 12 (ref 5–15)
BUN: 27 mg/dL — ABNORMAL HIGH (ref 8–23)
CO2: 22 mmol/L (ref 22–32)
Calcium: 8.5 mg/dL — ABNORMAL LOW (ref 8.9–10.3)
Chloride: 99 mmol/L (ref 98–111)
Creatinine, Ser: 2.53 mg/dL — ABNORMAL HIGH (ref 0.44–1.00)
GFR, Estimated: 20 mL/min — ABNORMAL LOW (ref >60)
Glucose, Bld: 183 mg/dL — ABNORMAL HIGH (ref 70–99)
Potassium: 4.4 mmol/L (ref 3.5–5.1)
Sodium: 133 mmol/L — ABNORMAL LOW (ref 135–145)

## 2021-01-26 LAB — CBC
HCT: 28.1 % — ABNORMAL LOW (ref 36.0–46.0)
Hemoglobin: 8.8 g/dL — ABNORMAL LOW (ref 12.0–15.0)
MCH: 27.8 pg (ref 26.0–34.0)
MCHC: 31.3 g/dL (ref 30.0–36.0)
MCV: 88.9 fL (ref 80.0–100.0)
Platelets: 442 10*3/uL — ABNORMAL HIGH (ref 150–400)
RBC: 3.16 MIL/uL — ABNORMAL LOW (ref 3.87–5.11)
RDW: 14.7 % (ref 11.5–15.5)
WBC: 8.4 10*3/uL (ref 4.0–10.5)
nRBC: 0 % (ref 0.0–0.2)

## 2021-01-26 LAB — GLUCOSE, CAPILLARY
Glucose-Capillary: 189 mg/dL — ABNORMAL HIGH (ref 70–99)
Glucose-Capillary: 217 mg/dL — ABNORMAL HIGH (ref 70–99)
Glucose-Capillary: 221 mg/dL — ABNORMAL HIGH (ref 70–99)
Glucose-Capillary: 203 mg/dL — ABNORMAL HIGH (ref 70–99)

## 2021-01-26 LAB — HEPARIN LEVEL (UNFRACTIONATED): Heparin Unfractionated: 0.38 IU/mL (ref 0.30–0.70)

## 2021-01-26 MED ORDER — CLONIDINE HCL 0.1 MG PO TABS: 0.1000 mg | ORAL_TABLET | Freq: Every day | ORAL | 11 refills | Status: DC

## 2021-01-26 MED ORDER — APIXABAN 5 MG PO TABS
5.0000 mg | ORAL_TABLET | Freq: Two times a day (BID) | ORAL | Status: DC
  Administered 2021-01-26 – 2021-01-27 (×3): 5 mg via ORAL
  Filled 2021-01-26 (×3): qty 1

## 2021-01-26 MED ORDER — DEXTROMETHORPHAN POLISTIREX ER 30 MG/5ML PO SUER
30.0000 mg | Freq: Two times a day (BID) | ORAL | 0 refills | Status: DC
Start: 2021-01-26 — End: 2021-03-26

## 2021-01-26 MED ORDER — APIXABAN 5 MG PO TABS
5.0000 mg | ORAL_TABLET | Freq: Two times a day (BID) | ORAL | Status: DC
Start: 2021-01-26 — End: 2021-03-12

## 2021-01-26 MED ORDER — INSULIN ASPART 100 UNIT/ML IJ SOLN: 0.0000 [IU] | Freq: Three times a day (TID) | INTRAMUSCULAR | 11 refills | Status: DC

## 2021-01-26 MED ORDER — CARVEDILOL 6.25 MG PO TABS: 6.2500 mg | ORAL_TABLET | Freq: Two times a day (BID) | ORAL | Status: DC

## 2021-01-26 NOTE — Progress Notes (Addendum)
ANTICOAGULATION CONSULT NOTE - Follow Up Consult  Pharmacy Consult for heparin > switch to Eliquis Indication: pulmonary embolus  No Known Allergies  Patient Measurements: Height: 5\' 6"  (167.6 cm) Weight: 101.5 kg (223 lb 12.3 oz) IBW/kg (Calculated) : 59.3 Heparin Dosing Weight: 81.6 kg  Vital Signs: Temp: 98.4 F (36.9 C) (09/19 0809) Temp Source: Oral (09/19 0809) BP: 142/64 (09/19 0809) Pulse Rate: 88 (09/19 0809)  Labs: Recent Labs    01/23/21 0900 01/24/21 0141 01/24/21 0141 01/24/21 0736 01/25/21 0123 01/26/21 0208  HGB  --  8.4*   < >  --  9.0* 8.8*  HCT  --  26.3*  --   --  28.1* 28.1*  PLT  --  383  --   --  399 442*  HEPARINUNFRC  --  0.13*   < > 0.32 0.37 0.38  CREATININE 2.76*  --   --  2.68* 2.62*  --    < > = values in this interval not displayed.     Estimated Creatinine Clearance: 23.7 mL/min (A) (by C-G formula based on SCr of 2.62 mg/dL (H)).  Assessment: 70 year old female who arrested at [facility] with confirmed PE and received alteplase 100mg  on 9/04 @ 0300.  Heparin level is at goal this AM at 0.38.  No overt bleeding or complications noted.    Asked by Dr. Wynelle Cleveland to switch to po Eliquis today in anticipation of D/C to SNF.  Received ~ 16 days of IV heparin and will not need loading dose of Eliquis.  Goal of Therapy:  Heparin level 0.3-0.7 units/ml Monitor platelets by anticoagulation protocol: Yes   Plan:  Stop IV heparin. Start po Eliquis 5 mg BID  [place] Crane, Roylene Reason, [facility] Clinical Pharmacist  01/26/2021 11:01 AM   [facility] pharmacy phone numbers are listed on [email or web address]

## 2021-01-26 NOTE — TOC Progression Note (Signed)
Transition of Care [facility]) - Progression Note    Patient Details  Name: Kathleen [name] MRN: [id number] Date of Birth: 08-Dec-1950  Transition of Care [facility]) CM/SW [place], [place] Phone Number: 01/26/2021, 4:45 PM  Clinical Narrative:    CSW prepared to DC pt to [facility], no DC Summary was available before facility pharmacy closed, Debbie at [facility] asked if pt can DC tomorrow due to not having any of the pt medications. CSW updated the pt on DC tomorrow pt was understanding. CSW will continue to follow for DC planning.  Expected Discharge Plan: Skilled Nursing Facility Barriers to Discharge: Insurance Authorization, SNF Pending bed offer, Continued Medical Work up  Expected Discharge Plan and Services Expected Discharge Plan: [place] In-house Referral: Clinical Social Work       Expected Discharge Date: 01/26/21                                     Social Determinants of Health (SDOH) Interventions    Readmission Risk Interventions No flowsheet data found.

## 2021-01-26 NOTE — Progress Notes (Signed)
Mobility Specialist Progress Note   01/26/21 1231  Mobility  Activity Ambulated in hall  Level of Assistance Minimal assist, patient does 75% or more  Assistive Device Front wheel walker (Chair follow)  Distance Ambulated (ft) 220 ft  Mobility Ambulated with assistance in hallway  Mobility Response Tolerated well  Mobility performed by Mobility specialist  $Mobility charge 1 Mobility   Found pt in recliner watching TV. Agreeable to mobility session w/ no c/o sx. X2 seated breaks w/ c/o soreness in feet while slowly fatiguing w/ distance. Pt returned back to recliner w/ call bell by side.    Pre Mobility: 88 HR, 140/65 BP Post Mobility: 89 HR, 117/62 BP  Holland Falling Mobility Specialist Phone Number [phone]

## 2021-01-26 NOTE — Plan of Care (Signed)
  Problem: Education: Goal: Knowledge of General Education information will improve Description Including pain rating scale, medication(s)/side effects and non-pharmacologic comfort measures Outcome: Progressing   

## 2021-01-26 NOTE — Progress Notes (Signed)
Physician Discharge Summary  Kathleen [name] OEH:[id number] DOB: 1950-08-12 DOA: 01/10/2021  PCP: Fayrene Helper, MD  Admit date: 01/10/2021 Discharge date: 01/26/2021  Admitted From: home  Disposition:  SNF   Recommendations for Outpatient Follow-up:  Please intermittently follow renal function until it has returned to baseline   Discharge Condition:  stable   CODE STATUS:  Full code   Diet recommendation:  heart healthy, diabetic Consultations: PCCM  Procedures/Studies: CPR TPA   Discharge Diagnoses:  Principal Problem:   Acute massive pulmonary embolism ([place]) Active Problems:   Acute respiratory failure with hypercapnia ([place])   Shock ([place])   AKI (acute kidney injury) ([place])   Type 2 diabetes mellitus ([place])   Hyperlipidemia   Morbid obesity ([place])   Essential hypertension   GERD   Chronic kidney disease, stage 3 unspecified ([place])     Brief Summary: Kathleen [name] is a 70 year old female with CKD stage IIIb, essential hypertension, diabetes mellitus type 2, unprovoked PE who was recommended to discontinue Eliquis in 7/22.  She presented to [facility] with dyspnea and dizziness and was found to have a recurrent PE with cardiac strain.  She developed a cardiac arrest and required thrombolytics.  ROSC was achieved after about 20 minutes.  She was intubated and started on pressors and transferred to [place] Hospital Course:  Principal Problem:   Acute massive pulmonary embolism, acute respiratory failure with hypoxia and hypercarbia Cardiac arrest -Bilateral PE with cardiac strain/cor pulmonale -history of unprovoked PE in June 2021 -100 mg of tPA given during CPR - 2D echo: EF 50 to 03%, grade 1 diastolic dysfunction with moderate LVH, McConnell sign is present consistent with acute cor pulmonale, moderate RV systolic dysfunction, moderately elevated pulmonary artery systolic pressure, moderate to severe tricuspid regurgitation - Venous duplex of lower  extremity did not reveal any DVT -Heparin being transitioned to Eliquis today as GFR has improved to 20 mL/min - Eliquis dosing $RemoveBefore'5mg'[id number]$  BID    Active Problems: AKI secondary to ATN-CKD stage IIIb Metabolic acidosis - Due to cardiogenic shock, IV contrast in setting of lisinopril use - Baseline creatinine is about 1.6 - Creatinine peaked at 5.75 on 01/15/2021 and has slowly been improving - Cr 2.53 and GFR 20 today   Anemia   - Hgb 10.5 in 9/22- has drifted down to 7.4 - likely secondary to consumption/ blood clots - Anemia panel is consistent with AOCD - 9/15>  transfused 1 U PRBC  - Hgb improved to 8.6   Chest wall pain secondary to CPR - Continued supportive treatment- has  Acute bronchitis -she was treated for acute bronchitis with a course of azithromycin and prednisone which she completed on 9/13     Type 2 diabetes mellitus, uncontrolled with hyperglycemia and diabetic neuropathy - Hemoglobin A1C 7.6 - dc Glucotrol - holding Lantus which she was taking 60 units of daily - sugars have been quite stable and we have been using Novolog sliding scale but she has not required much insulin    Morbid obesity  Body mass index is 36.12 kg/m.     Essential hypertension -Continue to hold lisinopril and spironolactone until renal function returns to baseline Continue amlodipine and clonidine   History of thyroid nodule - Outpatient follow-up with PCP recommended  GERD - Continue PPI   Pain in b/l feet and ankles - resolved     Discharge Exam: Vitals:   01/26/21 0500 01/26/21 0809  BP: 128/61 (!) 142/64  Pulse: 92 88  Resp: 18 20  Temp:  98.4 F (36.9 C)  SpO2: 92% 93%   Vitals:   01/26/21 0051 01/26/21 0456 01/26/21 0500 01/26/21 0809  BP: 132/63 128/61 128/61 (!) 142/64  Pulse: 87  92 88  Resp: (!) $RemoveB'24  18 20  'nMiLRTGM$ Temp: 98.5 F (36.9 C) 98.4 F (36.9 C)  98.4 F (36.9 C)  TempSrc: Oral Oral  Oral  SpO2: 93%  92% 93%  Weight:      Height:        General: Pt is  alert, awake, not in acute distress Cardiovascular: RRR, S1/S2 +, no rubs, no gallops Respiratory: CTA bilaterally, no wheezing, no rhonchi Abdominal: Soft, NT, ND, bowel sounds + Extremities: no edema, no cyanosis   Discharge Instructions  Discharge Instructions     Diet - low sodium heart healthy   Complete by: As directed    Diet Carb Modified   Complete by: As directed    Increase activity slowly   Complete by: As directed       Allergies as of 01/26/2021   No Known Allergies      Medication List     STOP taking these medications    blood glucose meter kit and supplies   glipiZIDE 2.5 MG 24 hr tablet Commonly known as: GLUCOTROL XL   Lantus SoloStar 100 UNIT/ML Solostar Pen Generic drug: insulin glargine   lisinopril 20 MG tablet Commonly known as: ZESTRIL   ramipril 10 MG capsule Commonly known as: ALTACE   spironolactone-hydrochlorothiazide 25-25 MG tablet Commonly known as: ALDACTAZIDE       TAKE these medications    Accu-Chek Guide test strip Generic drug: glucose blood USE TO TEST BLOOD SUGAR THREE TIMES DAILY AS DIRECTED      DX E11.65   amLODipine 10 MG tablet Commonly known as: NORVASC TAKE ONE TABLET BY MOUTH ONCE DAILY FOR BLOOD PRESSURE. What changed:  how much to take how to take this when to take this additional instructions   apixaban 5 MG Tabs tablet Commonly known as: ELIQUIS Take 1 tablet (5 mg total) by mouth 2 (two) times daily.   aspirin EC 81 MG tablet Take 81 mg by mouth daily. Swallow whole.   carvedilol 6.25 MG tablet Commonly known as: COREG Take 1 tablet (6.25 mg total) by mouth 2 (two) times daily with a meal.   cholecalciferol 1000 units tablet Commonly known as: VITAMIN D Take 1,000 Units by mouth daily.   cloNIDine 0.1 MG tablet Commonly known as: CATAPRES Take 1 tablet (0.1 mg total) by mouth daily. Start taking on: January 27, 2021 What changed:  medication strength how much to take how to take  this when to take this additional instructions   dextromethorphan 30 MG/5ML liquid Commonly known as: DELSYM Take 5 mLs (30 mg total) by mouth 2 (two) times daily.   gabapentin 100 MG capsule Commonly known as: NEURONTIN Take 1 capsule (100 mg total) by mouth 2 (two) times daily. What changed: when to take this   insulin aspart 100 UNIT/ML injection Commonly known as: NovoLOG Inject 0-20 Units into the skin 3 (three) times daily before meals. CBG < 70: Implement Hypoglycemia Standing Orders and refer to Hypoglycemia Standing Orders sidebar report  CBG 70 - 120: 0 units  CBG 121 - 150: 3 units  CBG 151 - 200: 4 units  CBG 201 - 250: 7 units  CBG 251 - 300: 11 units  CBG 301 - 350: 15 units  CBG 351 -  400: 20 units   pravastatin 40 MG tablet Commonly known as: PRAVACHOL TAKE ONE TABLET BY MOUTH ONCE DAILY.        No Known Allergies    CT HEAD WO CONTRAST (5MM)  Result Date: 01/11/2021 CLINICAL DATA:  Anoxic brain injury.  Status post cardiac arrest. EXAM: CT HEAD WITHOUT CONTRAST TECHNIQUE: Contiguous axial images were obtained from the base of the skull through the vertex without intravenous contrast. COMPARISON:  CT head without contrast 06/23/2013 FINDINGS: Brain: No acute infarct, hemorrhage, or mass lesion is present. Mild white matter changes are stable. Basal ganglia are intact. Insular ribbon is normal bilaterally. The ventricles are of normal size. No significant extraaxial fluid collection is present. Insert normal brainstem Vascular: Atherosclerotic calcifications in the cavernous internal carotid arteries have increased slightly. No hyperdense vessel is present. Skull: Calvarium is intact. No focal lytic or blastic lesions are present. No significant extracranial soft tissue lesion is present. Sinuses/Orbits: The paranasal sinuses and mastoid air cells are clear. The globes and orbits are within normal limits. IMPRESSION: 1. Stable mild white matter disease. 2. No acute  intracranial abnormality or significant interval change. Electronically Signed   By: San Morelle M.D.   On: 01/11/2021 09:35   CT Angio Chest PE W and/or Wo Contrast  Result Date: 01/11/2021 CLINICAL DATA:  PE suspected, low/intermediate prob, positive D-dimer EXAM: CT ANGIOGRAPHY CHEST WITH CONTRAST TECHNIQUE: Multidetector CT imaging of the chest was performed using the standard protocol during bolus administration of intravenous contrast. Multiplanar CT image reconstructions and MIPs were obtained to evaluate the vascular anatomy. CONTRAST:  95mL OMNIPAQUE IOHEXOL 350 MG/ML SOLN COMPARISON:  05/01/2020 FINDINGS: Cardiovascular: There are bilateral pulmonary emboli involving all lobes of both lungs. Evidence of right heart strain within RV to LV ratio of 2.7. Cardiomegaly. No evidence of aortic aneurysm. Mediastinum/Nodes: No mediastinal, hilar, or axillary adenopathy. Trachea and esophagus are unremarkable. Lungs/Pleura: No confluent airspace opacities or effusions. Upper Abdomen: Imaging into the upper abdomen demonstrates no acute findings. Reflux of contrast into the IVC and hepatic veins compatible with right heart dysfunction. Musculoskeletal: Chest wall soft tissues are unremarkable. No acute bony abnormality. Review of the MIP images confirms the above findings. IMPRESSION: Evidence of bilateral pulmonary emboli with right heart strain (RV to LV ratio of 2.7. Cardiomegaly. Critical Value/emergent results were called by telephone at the time of interpretation on 01/11/2021 at 3:08 am to provider [facility] , who verbally acknowledged these results. Electronically Signed   By: Rolm Baptise M.D.   On: 01/11/2021 03:10   US RENAL  Result Date: 01/13/2021 CLINICAL DATA:  Acute kidney injury EXAM: RENAL / URINARY TRACT ULTRASOUND COMPLETE COMPARISON:  Ultrasound 02/14/2020 FINDINGS: Right Kidney: Renal measurements: 10.5 x 6.1 x 6.2 cm = volume: 205.7 mL. Echogenicity within normal limits. Mild  cortical thinning. No hydronephrosis. Cyst at the upper pole measuring 3.9 x 4.4 x 3 cm. Left Kidney: Renal measurements: 9.8 x 5 x 5.4 cm = volume: 151.1 mL. Echogenicity within normal limits. No mass or hydronephrosis Bladder: Appears normal for degree of bladder distention. Other: None. IMPRESSION: 1. Negative for hydronephrosis. 2. Mild renal cortical thinning on the right. Simple right renal cyst Electronically Signed   By: Donavan Foil M.D.   On: 01/13/2021 19:51   DG Chest Port 1 View  Result Date: 01/12/2021 CLINICAL DATA:  Respiratory failure. EXAM: PORTABLE CHEST 1 VIEW COMPARISON:  Chest x-ray 01/11/2021 FINDINGS: Heart is enlarged. Endotracheal tube terminates 3.4 cm above the carina. Aeration  of both lungs is improved. Left greater than right residual basilar airspace disease noted. IMPRESSION: 1. Improving aeration of both lungs. 2. Residual left greater than right basilar airspace disease. Electronically Signed   By: San Morelle M.D.   On: 01/12/2021 08:32   DG Chest Port 1 View  Result Date: 01/11/2021 CLINICAL DATA:  Dizziness and shortness of breath EXAM: PORTABLE CHEST 1 VIEW COMPARISON:  Radiograph 01/11/2021 FINDINGS: The endotracheal tube overlies the distal trachea, 0.7 cm above the carina. The cardiac silhouette is obscured but enlarged. Pulmonary vascular congestion. The right lung appears clear. The left mid to lower lung appears hazy and difficult to evaluate, but there appears to be possible left lower lobe collapse. No large pleural effusion or visible pneumothorax. No acute osseous abnormality. IMPRESSION: Endotracheal tube tip is in the distal trachea, just above the carina, approximally 0.7 cm. Recommend retraction by 2.5 cm. Haziness of the left mid to lower lung in part due to radiographic technique, but with evidence of left lower lobe collapse. Recommend repositioning the endotracheal tube and repeating the chest radiograph. Unchanged cardiomegaly and pulmonary  vascular congestion. These results will be called to the ordering clinician or representative by the Radiologist Assistant, and communication documented in the PACS or [employer]. Electronically Signed   By: Maurine Simmering M.D.   On: 01/11/2021 09:18   DG Chest Portable 1 View  Result Date: 01/11/2021 CLINICAL DATA:  ET tube placement EXAM: PORTABLE CHEST 1 VIEW COMPARISON:  01/10/2021 FINDINGS: Endotracheal tube is 2 cm above the carina. Mild cardiomegaly, vascular congestion. No confluent opacities, effusions or edema. IMPRESSION: Endotracheal tube 2 cm above the carina. Cardiomegaly, vascular congestion. Electronically Signed   By: Rolm Baptise M.D.   On: 01/11/2021 03:59   DG Chest Portable 1 View  Result Date: 01/10/2021 CLINICAL DATA:  Shortness of breath EXAM: PORTABLE CHEST 1 VIEW COMPARISON:  10/22/2019 FINDINGS: Cardiomegaly. No confluent opacities, effusions or edema. No acute bony abnormality. IMPRESSION: Cardiomegaly.  No active disease. Electronically Signed   By: Rolm Baptise M.D.   On: 01/10/2021 23:24   DG Chest Port 1V same Day  Result Date: 01/14/2021 CLINICAL DATA:  Sudden onset of shortness of breath today. EXAM: PORTABLE CHEST 1 VIEW COMPARISON:  01/12/2021 FINDINGS: Interval extubation. Continued basilar improvement with residual streaky opacities at both lung bases. Stable heart size and mediastinal contours. No new consolidation. No pulmonary edema. No pneumothorax. No large volume pleural fluid. IMPRESSION: Interval extubation with continued basilar improvement in streaky opacities. No new abnormality. Electronically Signed   By: Keith Rake M.D.   On: 01/14/2021 18:14   ECHOCARDIOGRAM COMPLETE  Result Date: 01/11/2021    ECHOCARDIOGRAM REPORT   Patient Name:   SIMONNE BOULOS [facility] Date of Exam: 01/11/2021 Medical Rec #:  [id number]       Height:       66.0 in Accession #:    [id number]      Weight:       218.0 lb Date of Birth:  11/19/1950      BSA:          2.074 m Patient  Age:    35 years        BP:           120/83 mmHg Patient Gender: F               HR:           73 bpm. Exam Location:  Inpatient Procedure: 2D Echo,  Cardiac Doppler and Color Doppler Indications:    Pulmonary embolus  History:        Patient has prior history of Echocardiogram examinations, most                 recent 02/01/2020. Risk Factors:Hypertension, Diabetes and                 Dyslipidemia.  Sonographer:    Clayton Lefort RDCS (AE) Referring Phys: [id number] Estill Cotta  Sonographer Comments: Technically difficult study due to poor echo windows, echo performed with patient supine and on artificial respirator and patient is morbidly obese. Image acquisition challenging due to patient body habitus. Technically challening parasternal window due to endotracial tube position. IMPRESSIONS  1. Left ventricular ejection fraction, by estimation, is 55 to 60%. The left ventricle has normal function. Left ventricular endocardial border not optimally defined to evaluate regional wall motion. There is moderate concentric left ventricular hypertrophy. Left ventricular diastolic parameters are consistent with Grade I diastolic dysfunction (impaired relaxation). There is the interventricular septum is flattened in systole, consistent with right ventricular pressure overload.  2. McConnell's sign is present, consistent with acute cor pulmonale. Right ventricular systolic function is moderately reduced. The right ventricular size is normal. There is moderately elevated pulmonary artery systolic pressure.  3. The mitral valve is normal in structure. No evidence of mitral valve regurgitation. No evidence of mitral stenosis.  4. Tricuspid valve regurgitation is moderate to severe.  5. The aortic valve is normal in structure. Aortic valve regurgitation is not visualized. No aortic stenosis is present.  6. The inferior vena cava is dilated in size with <50% respiratory variability, suggesting right atrial pressure of 15 mmHg.  Comparison(s): The right ventricular systolic function is significantly worse. FINDINGS  Left Ventricle: Left ventricular ejection fraction, by estimation, is 55 to 60%. The left ventricle has normal function. Left ventricular endocardial border not optimally defined to evaluate regional wall motion. The left ventricular internal cavity size was normal in size. There is moderate concentric left ventricular hypertrophy. The interventricular septum is flattened in systole, consistent with right ventricular pressure overload. Left ventricular diastolic parameters are consistent with Grade I diastolic dysfunction (impaired relaxation). Normal left ventricular filling pressure. Right Ventricle: McConnell's sign is present, consistent with acute cor pulmonale. The right ventricular size is normal. No increase in right ventricular wall thickness. Right ventricular systolic function is moderately reduced. There is moderately elevated pulmonary artery systolic pressure. The tricuspid regurgitant velocity is 2.76 m/s, and with an assumed right atrial pressure of 15 mmHg, the estimated right ventricular systolic pressure is 51.0 mmHg. Left Atrium: Left atrial size was normal in size. Right Atrium: Right atrial size was normal in size. Pericardium: There is no evidence of pericardial effusion. Mitral Valve: The mitral valve is normal in structure. No evidence of mitral valve regurgitation. No evidence of mitral valve stenosis. MV peak gradient, 2.5 mmHg. The mean mitral valve gradient is 1.0 mmHg. Tricuspid Valve: The tricuspid valve is normal in structure. Tricuspid valve regurgitation is moderate to severe. No evidence of tricuspid stenosis. Aortic Valve: The aortic valve is normal in structure. Aortic valve regurgitation is not visualized. No aortic stenosis is present. Aortic valve mean gradient measures 4.0 mmHg. Aortic valve peak gradient measures 8.6 mmHg. Aortic valve area, by VTI measures 1.64 cm. Pulmonic Valve: The  pulmonic valve was normal in structure. Pulmonic valve regurgitation is not visualized. No evidence of pulmonic stenosis. Aorta: The aortic root is normal in size and  structure. Venous: The inferior vena cava is dilated in size with less than 50% respiratory variability, suggesting right atrial pressure of 15 mmHg. IAS/Shunts: No atrial level shunt detected by color flow Doppler.  LEFT VENTRICLE PLAX 2D LVIDd:         3.30 cm  Diastology LVIDs:         2.30 cm  LV e' medial:    7.94 cm/s LV PW:         1.60 cm  LV E/e' medial:  7.1 LV IVS:        1.60 cm  LV e' lateral:   4.68 cm/s LVOT diam:     2.00 cm  LV E/e' lateral: 12.1 LV SV:         30 LV SV Index:   14 LVOT Area:     3.14 cm  RIGHT VENTRICLE             IVC RV Basal diam:  3.10 cm     IVC diam: 2.90 cm RV S prime:     10.90 cm/s TAPSE (M-mode): 1.5 cm LEFT ATRIUM           Index       RIGHT ATRIUM           Index LA diam:      2.40 cm 1.16 cm/m  RA Area:     18.80 cm LA Vol (A4C): 21.3 ml 10.27 ml/m RA Volume:   52.60 ml  25.36 ml/m  AORTIC VALVE AV Area (Vmax):    1.98 cm AV Area (Vmean):   1.73 cm AV Area (VTI):     1.64 cm AV Vmax:           147.00 cm/s AV Vmean:          97.500 cm/s AV VTI:            0.182 m AV Peak Grad:      8.6 mmHg AV Mean Grad:      4.0 mmHg LVOT Vmax:         92.70 cm/s LVOT Vmean:        53.800 cm/s LVOT VTI:          0.095 m LVOT/AV VTI ratio: 0.52  AORTA Ao Root diam: 2.90 cm Ao Asc diam:  2.70 cm MITRAL VALVE               TRICUSPID VALVE MV Area (PHT): 2.39 cm    TR Peak grad:   30.5 mmHg MV Area VTI:   1.15 cm    TR Vmax:        276.00 cm/s MV Peak grad:  2.5 mmHg MV Mean grad:  1.0 mmHg    SHUNTS MV Vmax:       0.79 m/s    Systemic VTI:  0.10 m MV Vmean:      45.3 cm/s   Systemic Diam: 2.00 cm MV Decel Time: 317 msec MV E velocity: 56.60 cm/s MV A velocity: 72.10 cm/s MV E/A ratio:  0.79 Mihai Croitoru MD Electronically signed by Sanda Klein MD Signature Date/Time: 01/11/2021/3:00:04 PM    Final    VAS [place]  LOWER EXTREMITY VENOUS (DVT)  Result Date: 01/11/2021  Lower Venous DVT Study Patient Name:  JUNKO OHAGAN [facility]  Date of Exam:   01/11/2021 Medical Rec #: [id number]        Accession #:    [id number] Date of Birth: [birth year]       Patient Gender:  F Patient Age:   69 years Exam Location:  [facility] Procedure:      VAS [place] LOWER EXTREMITY VENOUS (DVT) Referring Phys: Hillery Aldo --------------------------------------------------------------------------------  Indications: New Pulmonary embolism. Other Indications: Risk Factors: History of PE 10/2019. Limitations: Ban Comparison Study: Performing Technologist: Sharion Dove RVS  Examination Guidelines: A complete evaluation includes B-mode imaging, spectral Doppler, color Doppler, and power Doppler as needed of all accessible portions of each vessel. Bilateral testing is considered an integral part of a complete examination. Limited examinations for reoccurring indications may be performed as noted. The reflux portion of the exam is performed with the patient in reverse Trendelenburg.  +---------+---------------+---------+-----------+----------+--------------+ RIGHT    CompressibilityPhasicitySpontaneityPropertiesThrombus Aging +---------+---------------+---------+-----------+----------+--------------+ CFV      Full           Yes      Yes                                 +---------+---------------+---------+-----------+----------+--------------+ SFJ      Full                                                        +---------+---------------+---------+-----------+----------+--------------+ FV Prox  Full                                                        +---------+---------------+---------+-----------+----------+--------------+ FV Mid   Full                                                        +---------+---------------+---------+-----------+----------+--------------+ FV DistalFull                                                         +---------+---------------+---------+-----------+----------+--------------+ PFV      Full                                                        +---------+---------------+---------+-----------+----------+--------------+ POP      Full           Yes      Yes                                 +---------+---------------+---------+-----------+----------+--------------+ PTV      Full                                                        +---------+---------------+---------+-----------+----------+--------------+  PERO     Full                                                        +---------+---------------+---------+-----------+----------+--------------+   +---------+---------------+---------+-----------+----------+--------------+ LEFT     CompressibilityPhasicitySpontaneityPropertiesThrombus Aging +---------+---------------+---------+-----------+----------+--------------+ CFV      Full           Yes      Yes                                 +---------+---------------+---------+-----------+----------+--------------+ SFJ      Full                                                        +---------+---------------+---------+-----------+----------+--------------+ FV Prox  Full                                                        +---------+---------------+---------+-----------+----------+--------------+ FV Mid   Full                                                        +---------+---------------+---------+-----------+----------+--------------+ FV DistalFull                                                        +---------+---------------+---------+-----------+----------+--------------+ PFV      Full                                                        +---------+---------------+---------+-----------+----------+--------------+ POP      Full           Yes      Yes                                  +---------+---------------+---------+-----------+----------+--------------+ PTV      Full                                                        +---------+---------------+---------+-----------+----------+--------------+ PERO     Full                                                        +---------+---------------+---------+-----------+----------+--------------+  Summary: BILATERAL: - No evidence of deep vein thrombosis seen in the lower extremities, bilaterally. -No evidence of popliteal cyst, bilaterally.   *See table(s) above for measurements and observations. Electronically signed by Deitra Mayo MD on 01/11/2021 at 1:55:09 PM.    Final      The results of significant diagnostics from this hospitalization (including imaging, microbiology, ancillary and laboratory) are listed below for reference.     Microbiology: Recent Results (from the past 240 hour(s))  Respiratory (~20 pathogens) panel by PCR     Status: None   Collection Time: 01/16/21  1:19 PM   Specimen: Nasopharyngeal Swab; Respiratory  Result Value Ref Range Status   Adenovirus NOT DETECTED NOT DETECTED Final   Coronavirus 229E NOT DETECTED NOT DETECTED Final    Comment: (NOTE) The Coronavirus on the Respiratory Panel, DOES NOT test for the novel  Coronavirus (2019 nCoV)    Coronavirus HKU1 NOT DETECTED NOT DETECTED Final   Coronavirus NL63 NOT DETECTED NOT DETECTED Final   Coronavirus OC43 NOT DETECTED NOT DETECTED Final   Metapneumovirus NOT DETECTED NOT DETECTED Final   Rhinovirus / Enterovirus NOT DETECTED NOT DETECTED Final   Influenza A NOT DETECTED NOT DETECTED Final   Influenza B NOT DETECTED NOT DETECTED Final   Parainfluenza Virus 1 NOT DETECTED NOT DETECTED Final   Parainfluenza Virus 2 NOT DETECTED NOT DETECTED Final   Parainfluenza Virus 3 NOT DETECTED NOT DETECTED Final   Parainfluenza Virus 4 NOT DETECTED NOT DETECTED Final   Respiratory Syncytial Virus NOT DETECTED NOT DETECTED Final    Bordetella pertussis NOT DETECTED NOT DETECTED Final   Bordetella Parapertussis NOT DETECTED NOT DETECTED Final   Chlamydophila pneumoniae NOT DETECTED NOT DETECTED Final   Mycoplasma pneumoniae NOT DETECTED NOT DETECTED Final    Comment: Performed at [facility], [address], [place], [place] [address]  SARS CORONAVIRUS 2 (TAT 6-24 HRS) Nasopharyngeal Nasopharyngeal Swab     Status: None   Collection Time: 01/23/21 12:56 PM   Specimen: Nasopharyngeal Swab  Result Value Ref Range Status   SARS Coronavirus 2 NEGATIVE NEGATIVE Final    Comment: (NOTE) SARS-CoV-2 target nucleic acids are NOT DETECTED.  The SARS-CoV-2 RNA is generally detectable in upper and lower respiratory specimens during the acute phase of infection. Negative results do not preclude SARS-CoV-2 infection, do not rule out co-infections with other pathogens, and should not be used as the sole basis for treatment or other patient management decisions. Negative results must be combined with clinical observations, patient history, and epidemiological information. The expected result is Negative.  Fact Sheet for Patients: SugarRoll.be  Fact Sheet for Healthcare Providers: https://www.woods-mathews.com/  This test is not yet approved or cleared by the [place] FDA and  has been authorized for detection and/or diagnosis of SARS-CoV-2 by FDA under an Emergency Use Authorization (EUA). This EUA will remain  in effect (meaning this test can be used) for the duration of the COVID-19 declaration under Se ction 564(b)(1) of the Act, 21 U.S.C. section 360bbb-3(b)(1), unless the authorization is terminated or revoked sooner.  Performed at [facility], [place] [address], [place], [place] [address]      Labs: BNP (last 3 results) Recent Labs    01/10/21 2317  BNP 22.2   Basic Metabolic Panel: Recent Labs  Lab 01/22/21 0816 01/23/21 0900 01/24/21 0736  01/25/21 0123 01/26/21 0208  NA 134* 133* 134* 135 133*  K 4.6 4.6 4.7 4.6 4.4  CL 98 100 101  101 99  CO2 $Re'26 25 23 24 22  'dzG$ GLUCOSE 170* 182* 142* 159* 183*  BUN 46* 39* 38* 30* 27*  CREATININE 2.87* 2.76* 2.68* 2.62* 2.53*  CALCIUM 8.7* 8.4* 8.3* 8.5* 8.5*   Liver Function Tests: No results for input(s): AST, ALT, ALKPHOS, BILITOT, PROT, ALBUMIN in the last 168 hours. No results for input(s): LIPASE, AMYLASE in the last 168 hours. No results for input(s): AMMONIA in the last 168 hours. CBC: Recent Labs  Lab 01/22/21 0221 01/23/21 0052 01/24/21 0141 01/25/21 0123 01/26/21 0208  WBC 13.6* 12.0* 11.2* 9.4 8.4  HGB 7.4* 8.6* 8.4* 9.0* 8.8*  HCT 23.6* 27.1* 26.3* 28.1* 28.1*  MCV 85.8 87.1 87.4 88.4 88.9  PLT 381 392 383 399 442*   Cardiac Enzymes: No results for input(s): CKTOTAL, CKMB, CKMBINDEX, TROPONINI in the last 168 hours. BNP: Invalid input(s): POCBNP CBG: Recent Labs  Lab 01/25/21 0615 01/25/21 1208 01/25/21 1616 01/25/21 2128 01/26/21 0635  GLUCAP 180* 228* 184* 234* 189*   D-Dimer No results for input(s): DDIMER in the last 72 hours. Hgb A1c No results for input(s): HGBA1C in the last 72 hours. Lipid Profile No results for input(s): CHOL, HDL, LDLCALC, TRIG, CHOLHDL, LDLDIRECT in the last 72 hours. Thyroid function studies No results for input(s): TSH, T4TOTAL, T3FREE, THYROIDAB in the last 72 hours.  Invalid input(s): FREET3 Anemia work up No results for input(s): VITAMINB12, FOLATE, FERRITIN, TIBC, IRON, RETICCTPCT in the last 72 hours. Urinalysis    Component Value Date/Time   COLORURINE YELLOW 01/13/2021 1630   APPEARANCEUR CLOUDY (A) 01/13/2021 1630   LABSPEC 1.015 01/13/2021 1630   PHURINE 6.0 01/13/2021 1630   GLUCOSEU NEGATIVE 01/13/2021 1630   HGBUR SMALL (A) 01/13/2021 1630   BILIRUBINUR NEGATIVE 01/13/2021 1630   KETONESUR NEGATIVE 01/13/2021 1630   PROTEINUR NEGATIVE 01/13/2021 1630   NITRITE NEGATIVE 01/13/2021 1630    LEUKOCYTESUR SMALL (A) 01/13/2021 1630   Sepsis Labs Invalid input(s): PROCALCITONIN,  WBC,  LACTICIDVEN Microbiology Recent Results (from the past 240 hour(s))  Respiratory (~20 pathogens) panel by PCR     Status: None   Collection Time: 01/16/21  1:19 PM   Specimen: Nasopharyngeal Swab; Respiratory  Result Value Ref Range Status   Adenovirus NOT DETECTED NOT DETECTED Final   Coronavirus 229E NOT DETECTED NOT DETECTED Final    Comment: (NOTE) The Coronavirus on the Respiratory Panel, DOES NOT test for the novel  Coronavirus (2019 nCoV)    Coronavirus HKU1 NOT DETECTED NOT DETECTED Final   Coronavirus NL63 NOT DETECTED NOT DETECTED Final   Coronavirus OC43 NOT DETECTED NOT DETECTED Final   Metapneumovirus NOT DETECTED NOT DETECTED Final   Rhinovirus / Enterovirus NOT DETECTED NOT DETECTED Final   Influenza A NOT DETECTED NOT DETECTED Final   Influenza B NOT DETECTED NOT DETECTED Final   Parainfluenza Virus 1 NOT DETECTED NOT DETECTED Final   Parainfluenza Virus 2 NOT DETECTED NOT DETECTED Final   Parainfluenza Virus 3 NOT DETECTED NOT DETECTED Final   Parainfluenza Virus 4 NOT DETECTED NOT DETECTED Final   Respiratory Syncytial Virus NOT DETECTED NOT DETECTED Final   Bordetella pertussis NOT DETECTED NOT DETECTED Final   Bordetella Parapertussis NOT DETECTED NOT DETECTED Final   Chlamydophila pneumoniae NOT DETECTED NOT DETECTED Final   Mycoplasma pneumoniae NOT DETECTED NOT DETECTED Final    Comment: Performed at [facility], [place]. [address], [place], [place] [address]  SARS CORONAVIRUS 2 (TAT 6-24 HRS) Nasopharyngeal Nasopharyngeal Swab     Status:  None   Collection Time: 01/23/21 12:56 PM   Specimen: Nasopharyngeal Swab  Result Value Ref Range Status   SARS Coronavirus 2 NEGATIVE NEGATIVE Final    Comment: (NOTE) SARS-CoV-2 target nucleic acids are NOT DETECTED.  The SARS-CoV-2 RNA is generally detectable in upper and lower respiratory specimens during the acute  phase of infection. Negative results do not preclude SARS-CoV-2 infection, do not rule out co-infections with other pathogens, and should not be used as the sole basis for treatment or other patient management decisions. Negative results must be combined with clinical observations, patient history, and epidemiological information. The expected result is Negative.  Fact Sheet for Patients: SugarRoll.be  Fact Sheet for Healthcare Providers: https://www.woods-mathews.com/  This test is not yet approved or cleared by the [place] FDA and  has been authorized for detection and/or diagnosis of SARS-CoV-2 by FDA under an Emergency Use Authorization (EUA). This EUA will remain  in effect (meaning this test can be used) for the duration of the COVID-19 declaration under Se ction 564(b)(1) of the Act, 21 U.S.C. section 360bbb-3(b)(1), unless the authorization is terminated or revoked sooner.  Performed at [facility], [place] [address] [place], [place] [address]      Time coordinating discharge in minutes: 65  SIGNED:   Debbe Odea, MD  [facility] 01/26/2021, 11:07 AM

## 2021-01-27 DIAGNOSIS — N1832 Chronic kidney disease, stage 3b: Secondary | ICD-10-CM | POA: Diagnosis not present

## 2021-01-27 DIAGNOSIS — N183 Chronic kidney disease, stage 3 unspecified: Secondary | ICD-10-CM | POA: Diagnosis not present

## 2021-01-27 DIAGNOSIS — I2699 Other pulmonary embolism without acute cor pulmonale: Secondary | ICD-10-CM | POA: Diagnosis not present

## 2021-01-27 DIAGNOSIS — E1169 Type 2 diabetes mellitus with other specified complication: Secondary | ICD-10-CM | POA: Diagnosis not present

## 2021-01-27 DIAGNOSIS — Z7401 Bed confinement status: Secondary | ICD-10-CM | POA: Diagnosis not present

## 2021-01-27 DIAGNOSIS — I10 Essential (primary) hypertension: Secondary | ICD-10-CM | POA: Diagnosis not present

## 2021-01-27 DIAGNOSIS — R531 Weakness: Secondary | ICD-10-CM | POA: Diagnosis not present

## 2021-01-27 DIAGNOSIS — R2689 Other abnormalities of gait and mobility: Secondary | ICD-10-CM | POA: Diagnosis not present

## 2021-01-27 DIAGNOSIS — E785 Hyperlipidemia, unspecified: Secondary | ICD-10-CM | POA: Diagnosis not present

## 2021-01-27 DIAGNOSIS — M6281 Muscle weakness (generalized): Secondary | ICD-10-CM | POA: Diagnosis not present

## 2021-01-27 DIAGNOSIS — R918 Other nonspecific abnormal finding of lung field: Secondary | ICD-10-CM | POA: Diagnosis not present

## 2021-01-27 DIAGNOSIS — Z743 Need for continuous supervision: Secondary | ICD-10-CM | POA: Diagnosis not present

## 2021-01-27 DIAGNOSIS — R5381 Other malaise: Secondary | ICD-10-CM | POA: Diagnosis not present

## 2021-01-27 DIAGNOSIS — K219 Gastro-esophageal reflux disease without esophagitis: Secondary | ICD-10-CM | POA: Diagnosis not present

## 2021-01-27 DIAGNOSIS — M129 Arthropathy, unspecified: Secondary | ICD-10-CM | POA: Diagnosis not present

## 2021-01-27 DIAGNOSIS — N179 Acute kidney failure, unspecified: Secondary | ICD-10-CM | POA: Diagnosis not present

## 2021-01-27 DIAGNOSIS — E1159 Type 2 diabetes mellitus with other circulatory complications: Secondary | ICD-10-CM | POA: Diagnosis not present

## 2021-01-27 DIAGNOSIS — J9690 Respiratory failure, unspecified, unspecified whether with hypoxia or hypercapnia: Secondary | ICD-10-CM | POA: Diagnosis not present

## 2021-01-27 DIAGNOSIS — J9601 Acute respiratory failure with hypoxia: Secondary | ICD-10-CM | POA: Diagnosis not present

## 2021-01-27 LAB — GLUCOSE, CAPILLARY
Glucose-Capillary: 170 mg/dL — ABNORMAL HIGH (ref 70–99)
Glucose-Capillary: 242 mg/dL — ABNORMAL HIGH (ref 70–99)

## 2021-01-27 NOTE — Discharge Summary (Signed)
Physician Discharge Summary  Kathleen [name] KTG:[id number] DOB: 1950/12/09 DOA: 01/10/2021  PCP: Kathleen Helper, MD  Admit date: 01/10/2021 Discharge date: 01/27/2021  Admitted From: home  Disposition:  SNF   Recommendations for Outpatient Follow-up:  Please intermittently follow renal function until it has improved to baseline  Home Health:  none- SNF  Discharge Condition:  stable   CODE STATUS:  full code   Diet recommendation:  heart haelthy, daibetic Consultations: PCCM  Procedures/Studies: Engineer, maintenance (IT), tPA   Discharge Diagnoses:  Principal Problem:   Acute massive pulmonary embolism ([place]) Active Problems:   Acute respiratory failure with hypercapnia ([place])   Shock ([place])   AKI (acute kidney injury) ([place])   Type 2 diabetes mellitus ([place])   Hyperlipidemia   Morbid obesity ([place])   Essential hypertension   GERD   Chronic kidney disease, stage 3 unspecified ([place])     Brief Summary: [name] is a 70 year old female with CKD stage IIIb, essential hypertension, diabetes mellitus type 2, unprovoked PE who was recommended to discontinue Eliquis in 7/22.  She presented to [facility] with dyspnea and dizziness and was found to have a recurrent PE with cardiac strain.  She developed a cardiac arrest and required thrombolytics.  ROSC was achieved after about 20 minutes.  She was intubated and started on pressors and transferred to [place] Hospital Course:  Principal Problem:   Acute massive pulmonary embolism, acute respiratory failure with hypoxia and hypercarbia Cardiac arrest -Bilateral PE with cardiac strain/cor pulmonale -history of unprovoked PE in June 2021 -100 mg of tPA given during CPR - 2D echo: EF 50 to 42%, grade 1 diastolic dysfunction with moderate LVH, McConnell sign is present consistent with acute cor pulmonale, moderate RV systolic dysfunction, moderately elevated pulmonary artery systolic pressure, moderate to severe tricuspid  regurgitation - Venous duplex of lower extremity did not reveal any DVT -Heparin transitioned to Eliquis on 9/19 - Eliquis dosing > 110m BID    Active Problems: AKI secondary to ATN-CKD stage IIIb Metabolic acidosis - Due to cardiogenic shock, IV contrast in setting of lisinopril use - Creatinine peaked at 5.75 on 01/15/2021 and has slowly been improving - now Cr 2.53 and GFR 20  - Baseline creatinine is about 1.6   Anemia   - Hgb 10.5 in 9/22- has drifted down to 7.4 - likely secondary to consumption/ blood clots - Anemia panel is consistent with AOCD - 9/15>  transfused 1 U PRBC  - Hgb improved to 8.6   Chest wall pain secondary to CPR - Continued supportive treatment- has   Acute bronchitis -she was treated for acute bronchitis with a course of azithromycin and prednisone which she completed on 9/13     Type 2 diabetes mellitus, uncontrolled with hyperglycemia and diabetic neuropathy - Hemoglobin A1C 7.6 - dc Glucotrol - holding Lantus which she was taking 60 units of daily - sugars have been quite stable and we have been using Novolog sliding scale but she has not required much insulin     Morbid obesity  Body mass index is 36.12 kg/m.     Essential hypertension -Continue to hold lisinopril and spironolactone until renal function returns to baseline Continue amlodipine and clonidine   History of thyroid nodule - Outpatient follow-up with PCP recommended  GERD - Continue PPI   Pain in b/l feet and ankles - resolved         Discharge Exam: Vitals:   01/26/21 2310 01/27/21 08768  BP: 138/64 (!) 144/78  Pulse: 80 85  Resp: 20 20  Temp: 98.9 F (37.2 C) 98.9 F (37.2 C)  SpO2: 100% 99%   Vitals:   01/26/21 1624 01/26/21 2028 01/26/21 2310 01/27/21 0337  BP: 139/60 (!) 149/73 138/64 (!) 144/78  Pulse: 86 80 80 85  Resp: _0 Temp: 98.9 F (37.2 C) 98.9 F (37.2 C) 98.9 F (37.2 C) 98.9 F (37.2 C)  TempSrc: Oral Oral Oral Oral  SpO2: 100%  100% 100% 99%  Weight:      Height:        General: Pt is alert, awake, not in acute distress Cardiovascular: RRR, S1/S2 +, no rubs, no gallops Respiratory: CTA bilaterally, no wheezing, no rhonchi Abdominal: Soft, NT, ND, bowel sounds + Extremities: no edema, no cyanosis   Discharge Instructions  Discharge Instructions     Diet - low sodium heart healthy   Complete by: As directed    Diet Carb Modified   Complete by: As directed    Increase activity slowly   Complete by: As directed       Allergies as of 01/27/2021   No Known Allergies      Medication List     STOP taking these medications    blood glucose meter kit and supplies   glipiZIDE 2.5 MG 24 hr tablet Commonly known as: GLUCOTROL XL   Lantus SoloStar 100 UNIT/ML Solostar Pen Generic drug: insulin glargine   lisinopril 20 MG tablet Commonly known as: ZESTRIL   ramipril 10 MG capsule Commonly known as: ALTACE   spironolactone-hydrochlorothiazide 25-25 MG tablet Commonly known as: ALDACTAZIDE       TAKE these medications    Accu-Chek Guide test strip Generic drug: glucose blood USE TO TEST BLOOD SUGAR THREE TIMES DAILY AS DIRECTED      DX E11.65   amLODipine 10 MG tablet Commonly known as: NORVASC TAKE ONE TABLET BY MOUTH ONCE DAILY FOR BLOOD PRESSURE. What changed:  how much to take how to take this when to take this additional instructions   apixaban 5 MG Tabs tablet Commonly known as: ELIQUIS Take 1 tablet (5 mg total) by mouth 2 (two) times daily.   aspirin EC 81 MG tablet Take 81 mg by mouth daily. Swallow whole.   carvedilol 6.25 MG tablet Commonly known as: COREG Take 1 tablet (6.25 mg total) by mouth 2 (two) times daily with a meal.   cholecalciferol 1000 units tablet Commonly known as: VITAMIN D Take 1,000 Units by mouth daily.   cloNIDine 0.1 MG tablet Commonly known as: CATAPRES Take 1 tablet (0.1 mg total) by mouth daily. What changed:  medication strength how  much to take how to take this when to take this additional instructions   dextromethorphan 30 MG/5ML liquid Commonly known as: DELSYM Take 5 mLs (30 mg total) by mouth 2 (two) times daily.   gabapentin 100 MG capsule Commonly known as: NEURONTIN Take 1 capsule (100 mg total) by mouth 2 (two) times daily. What changed: when to take this   insulin aspart 100 UNIT/ML injection Commonly known as: NovoLOG Inject 0-20 Units into the skin 3 (three) times daily before meals. CBG < 70: Implement Hypoglycemia Standing Orders and refer to Hypoglycemia Standing Orders sidebar report  CBG 70 - 120: 0 units  CBG 121 - 150: 3 units  CBG 151 - 200: 4 units  CBG 201 - 250: 7 units  CBG 251 - 300: 11 units  CBG  301 - 350: 15 units  CBG 351 - 400: 20 units   pravastatin 40 MG tablet Commonly known as: PRAVACHOL TAKE ONE TABLET BY MOUTH ONCE DAILY.        No Known Allergies    CT HEAD WO CONTRAST (5MM)  Result Date: 01/11/2021 CLINICAL DATA:  Anoxic brain injury.  Status post cardiac arrest. EXAM: CT HEAD WITHOUT CONTRAST TECHNIQUE: Contiguous axial images were obtained from the base of the skull through the vertex without intravenous contrast. COMPARISON:  CT head without contrast 06/23/2013 FINDINGS: Brain: No acute infarct, hemorrhage, or mass lesion is present. Mild white matter changes are stable. Basal ganglia are intact. Insular ribbon is normal bilaterally. The ventricles are of normal size. No significant extraaxial fluid collection is present. Insert normal brainstem Vascular: Atherosclerotic calcifications in the cavernous internal carotid arteries have increased slightly. No hyperdense vessel is present. Skull: Calvarium is intact. No focal lytic or blastic lesions are present. No significant extracranial soft tissue lesion is present. Sinuses/Orbits: The paranasal sinuses and mastoid air cells are clear. The globes and orbits are within normal limits. IMPRESSION: 1. Stable mild white  matter disease. 2. No acute intracranial abnormality or significant interval change. Electronically Signed   By: San Morelle M.D.   On: 01/11/2021 09:35   CT Angio Chest PE W and/or Wo Contrast  Result Date: 01/11/2021 CLINICAL DATA:  PE suspected, low/intermediate prob, positive D-dimer EXAM: CT ANGIOGRAPHY CHEST WITH CONTRAST TECHNIQUE: Multidetector CT imaging of the chest was performed using the standard protocol during bolus administration of intravenous contrast. Multiplanar CT image reconstructions and MIPs were obtained to evaluate the vascular anatomy. CONTRAST:  78m OMNIPAQUE IOHEXOL 350 MG/ML SOLN COMPARISON:  05/01/2020 FINDINGS: Cardiovascular: There are bilateral pulmonary emboli involving all lobes of both lungs. Evidence of right heart strain within RV to LV ratio of 2.7. Cardiomegaly. No evidence of aortic aneurysm. Mediastinum/Nodes: No mediastinal, hilar, or axillary adenopathy. Trachea and esophagus are unremarkable. Lungs/Pleura: No confluent airspace opacities or effusions. Upper Abdomen: Imaging into the upper abdomen demonstrates no acute findings. Reflux of contrast into the IVC and hepatic veins compatible with right heart dysfunction. Musculoskeletal: Chest wall soft tissues are unremarkable. No acute bony abnormality. Review of the MIP images confirms the above findings. IMPRESSION: Evidence of bilateral pulmonary emboli with right heart strain (RV to LV ratio of 2.7. Cardiomegaly. Critical Value/emergent results were called by telephone at the time of interpretation on 01/11/2021 at 3:08 am to [facility], who verbally acknowledged these results. Electronically Signed   By: KRolm BaptiseM.D.   On: 01/11/2021 03:10   [place]  Result Date: 01/13/2021 CLINICAL DATA:  Acute kidney injury EXAM: RENAL / URINARY TRACT ULTRASOUND COMPLETE COMPARISON:  Ultrasound 02/14/2020 FINDINGS: Right Kidney: Renal measurements: 10.5 x 6.1 x 6.2 cm = volume: 205.7 mL.  Echogenicity within normal limits. Mild cortical thinning. No hydronephrosis. Cyst at the upper pole measuring 3.9 x 4.4 x 3 cm. Left Kidney: Renal measurements: 9.8 x 5 x 5.4 cm = volume: 151.1 mL. Echogenicity within normal limits. No mass or hydronephrosis Bladder: Appears normal for degree of bladder distention. Other: None. IMPRESSION: 1. Negative for hydronephrosis. 2. Mild renal cortical thinning on the right. Simple right renal cyst Electronically Signed   By: KDonavan FoilM.D.   On: 01/13/2021 19:51   DG Chest Port 1 View  Result Date: 01/12/2021 CLINICAL DATA:  Respiratory failure. EXAM: PORTABLE CHEST 1 VIEW COMPARISON:  Chest x-ray 01/11/2021 FINDINGS: Heart is enlarged.  Endotracheal tube terminates 3.4 cm above the carina. Aeration of both lungs is improved. Left greater than right residual basilar airspace disease noted. IMPRESSION: 1. Improving aeration of both lungs. 2. Residual left greater than right basilar airspace disease. Electronically Signed   By: San Morelle M.D.   On: 01/12/2021 08:32   DG Chest Port 1 View  Result Date: 01/11/2021 CLINICAL DATA:  Dizziness and shortness of breath EXAM: PORTABLE CHEST 1 VIEW COMPARISON:  Radiograph 01/11/2021 FINDINGS: The endotracheal tube overlies the distal trachea, 0.7 cm above the carina. The cardiac silhouette is obscured but enlarged. Pulmonary vascular congestion. The right lung appears clear. The left mid to lower lung appears hazy and difficult to evaluate, but there appears to be possible left lower lobe collapse. No large pleural effusion or visible pneumothorax. No acute osseous abnormality. IMPRESSION: Endotracheal tube tip is in the distal trachea, just above the carina, approximally 0.7 cm. Recommend retraction by 2.5 cm. Haziness of the left mid to lower lung in part due to radiographic technique, but with evidence of left lower lobe collapse. Recommend repositioning the endotracheal tube and repeating the chest radiograph.  Unchanged cardiomegaly and pulmonary vascular congestion. These results will be called to the ordering clinician or representative by the Radiologist Assistant, and communication documented in the PACS or [employer]. Electronically Signed   By: Maurine Simmering M.D.   On: 01/11/2021 09:18   DG Chest Portable 1 View  Result Date: 01/11/2021 CLINICAL DATA:  ET tube placement EXAM: PORTABLE CHEST 1 VIEW COMPARISON:  01/10/2021 FINDINGS: Endotracheal tube is 2 cm above the carina. Mild cardiomegaly, vascular congestion. No confluent opacities, effusions or edema. IMPRESSION: Endotracheal tube 2 cm above the carina. Cardiomegaly, vascular congestion. Electronically Signed   By: Rolm Baptise M.D.   On: 01/11/2021 03:59   DG Chest Portable 1 View  Result Date: 01/10/2021 CLINICAL DATA:  Shortness of breath EXAM: PORTABLE CHEST 1 VIEW COMPARISON:  10/22/2019 FINDINGS: Cardiomegaly. No confluent opacities, effusions or edema. No acute bony abnormality. IMPRESSION: Cardiomegaly.  No active disease. Electronically Signed   By: Rolm Baptise M.D.   On: 01/10/2021 23:24   DG Chest Port 1V same Day  Result Date: 01/14/2021 CLINICAL DATA:  Sudden onset of shortness of breath today. EXAM: PORTABLE CHEST 1 VIEW COMPARISON:  01/12/2021 FINDINGS: Interval extubation. Continued basilar improvement with residual streaky opacities at both lung bases. Stable heart size and mediastinal contours. No new consolidation. No pulmonary edema. No pneumothorax. No large volume pleural fluid. IMPRESSION: Interval extubation with continued basilar improvement in streaky opacities. No new abnormality. Electronically Signed   By: Keith Rake M.D.   On: 01/14/2021 18:14   ECHOCARDIOGRAM COMPLETE  Result Date: 01/11/2021    ECHOCARDIOGRAM REPORT   Patient Name:   Kathleen [name] [facility] Date of Exam: 01/11/2021 Medical Rec #:  [id number]       Height:       66.0 in Accession #:    [id number]      Weight:       218.0 lb Date of Birth:  11-03-1950       BSA:          2.074 m Patient Age:    19 years        BP:           120/83 mmHg Patient Gender: F               HR:  73 bpm. Exam Location:  Inpatient Procedure: 2D Echo, Cardiac Doppler and Color Doppler Indications:    Pulmonary embolus  History:        Patient has prior history of Echocardiogram examinations, most                 recent 02/01/2020. Risk Factors:Hypertension, Diabetes and                 Dyslipidemia.  Sonographer:    Clayton Lefort RDCS (AE) Referring Phys: [id number] Estill Cotta  Sonographer Comments: Technically difficult study due to poor echo windows, echo performed with patient supine and on artificial respirator and patient is morbidly obese. Image acquisition challenging due to patient body habitus. Technically challening parasternal window due to endotracial tube position. IMPRESSIONS  1. Left ventricular ejection fraction, by estimation, is 55 to 60%. The left ventricle has normal function. Left ventricular endocardial border not optimally defined to evaluate regional wall motion. There is moderate concentric left ventricular hypertrophy. Left ventricular diastolic parameters are consistent with Grade I diastolic dysfunction (impaired relaxation). There is the interventricular septum is flattened in systole, consistent with right ventricular pressure overload.  2. McConnell's sign is present, consistent with acute cor pulmonale. Right ventricular systolic function is moderately reduced. The right ventricular size is normal. There is moderately elevated pulmonary artery systolic pressure.  3. The mitral valve is normal in structure. No evidence of mitral valve regurgitation. No evidence of mitral stenosis.  4. Tricuspid valve regurgitation is moderate to severe.  5. The aortic valve is normal in structure. Aortic valve regurgitation is not visualized. No aortic stenosis is present.  6. The inferior vena cava is dilated in size with <50% respiratory variability, suggesting right  atrial pressure of 15 mmHg. Comparison(s): The right ventricular systolic function is significantly worse. FINDINGS  Left Ventricle: Left ventricular ejection fraction, by estimation, is 55 to 60%. The left ventricle has normal function. Left ventricular endocardial border not optimally defined to evaluate regional wall motion. The left ventricular internal cavity size was normal in size. There is moderate concentric left ventricular hypertrophy. The interventricular septum is flattened in systole, consistent with right ventricular pressure overload. Left ventricular diastolic parameters are consistent with Grade I diastolic dysfunction (impaired relaxation). Normal left ventricular filling pressure. Right Ventricle: McConnell's sign is present, consistent with acute cor pulmonale. The right ventricular size is normal. No increase in right ventricular wall thickness. Right ventricular systolic function is moderately reduced. There is moderately elevated pulmonary artery systolic pressure. The tricuspid regurgitant velocity is 2.76 m/s, and with an assumed right atrial pressure of 15 mmHg, the estimated right ventricular systolic pressure is 19.1 mmHg. Left Atrium: Left atrial size was normal in size. Right Atrium: Right atrial size was normal in size. Pericardium: There is no evidence of pericardial effusion. Mitral Valve: The mitral valve is normal in structure. No evidence of mitral valve regurgitation. No evidence of mitral valve stenosis. MV peak gradient, 2.5 mmHg. The mean mitral valve gradient is 1.0 mmHg. Tricuspid Valve: The tricuspid valve is normal in structure. Tricuspid valve regurgitation is moderate to severe. No evidence of tricuspid stenosis. Aortic Valve: The aortic valve is normal in structure. Aortic valve regurgitation is not visualized. No aortic stenosis is present. Aortic valve mean gradient measures 4.0 mmHg. Aortic valve peak gradient measures 8.6 mmHg. Aortic valve area, by VTI measures  1.64 cm. Pulmonic Valve: The pulmonic valve was normal in structure. Pulmonic valve regurgitation is not visualized. No evidence of pulmonic stenosis.  Aorta: The aortic root is normal in size and structure. Venous: The inferior vena cava is dilated in size with less than 50% respiratory variability, suggesting right atrial pressure of 15 mmHg. IAS/Shunts: No atrial level shunt detected by color flow Doppler.  LEFT VENTRICLE PLAX 2D LVIDd:         3.30 cm  Diastology LVIDs:         2.30 cm  LV e' medial:    7.94 cm/s LV PW:         1.60 cm  LV E/e' medial:  7.1 LV IVS:        1.60 cm  LV e' lateral:   4.68 cm/s LVOT diam:     2.00 cm  LV E/e' lateral: 12.1 LV SV:         30 LV SV Index:   14 LVOT Area:     3.14 cm  RIGHT VENTRICLE             IVC RV Basal diam:  3.10 cm     IVC diam: 2.90 cm RV S prime:     10.90 cm/s TAPSE (M-mode): 1.5 cm LEFT ATRIUM           Index       RIGHT ATRIUM           Index LA diam:      2.40 cm 1.16 cm/m  RA Area:     18.80 cm LA Vol (A4C): 21.3 ml 10.27 ml/m RA Volume:   52.60 ml  25.36 ml/m  AORTIC VALVE AV Area (Vmax):    1.98 cm AV Area (Vmean):   1.73 cm AV Area (VTI):     1.64 cm AV Vmax:           147.00 cm/s AV Vmean:          97.500 cm/s AV VTI:            0.182 m AV Peak Grad:      8.6 mmHg AV Mean Grad:      4.0 mmHg LVOT Vmax:         92.70 cm/s LVOT Vmean:        53.800 cm/s LVOT VTI:          0.095 m LVOT/AV VTI ratio: 0.52  AORTA Ao Root diam: 2.90 cm Ao Asc diam:  2.70 cm MITRAL VALVE               TRICUSPID VALVE MV Area (PHT): 2.39 cm    TR Peak grad:   30.5 mmHg MV Area VTI:   1.15 cm    TR Vmax:        276.00 cm/s MV Peak grad:  2.5 mmHg MV Mean grad:  1.0 mmHg    SHUNTS MV Vmax:       0.79 m/s    Systemic VTI:  0.10 m MV Vmean:      45.3 cm/s   Systemic Diam: 2.00 cm MV Decel Time: 317 msec MV E velocity: 56.60 cm/s MV A velocity: 72.10 cm/s MV E/A ratio:  0.79 Mihai Croitoru MD Electronically signed by Sanda Klein MD Signature Date/Time:  01/11/2021/3:00:04 PM    Final    VAS [place] LOWER EXTREMITY VENOUS (DVT)  Result Date: 01/11/2021  Lower Venous DVT Study Patient Name:  Kathleen [name] [facility]  Date of Exam:   01/11/2021 Medical Rec #: [id number]        Accession #:    [id number] Date of Birth:  [birth year]       Patient Gender: F Patient Age:   30 years Exam Location:  [facility] Procedure:      VAS [place] LOWER EXTREMITY VENOUS (DVT) Referring Phys: Hillery Aldo --------------------------------------------------------------------------------  Indications: New Pulmonary embolism. Other Indications: Risk Factors: History of PE 10/2019. Limitations: Ban Comparison Study: Performing Technologist: Sharion Dove RVS  Examination Guidelines: A complete evaluation includes B-mode imaging, spectral Doppler, color Doppler, and power Doppler as needed of all accessible portions of each vessel. Bilateral testing is considered an integral part of a complete examination. Limited examinations for reoccurring indications may be performed as noted. The reflux portion of the exam is performed with the patient in reverse Trendelenburg.  +---------+---------------+---------+-----------+----------+--------------+ RIGHT    CompressibilityPhasicitySpontaneityPropertiesThrombus Aging +---------+---------------+---------+-----------+----------+--------------+ CFV      Full           Yes      Yes                                 +---------+---------------+---------+-----------+----------+--------------+ SFJ      Full                                                        +---------+---------------+---------+-----------+----------+--------------+ FV Prox  Full                                                        +---------+---------------+---------+-----------+----------+--------------+ FV Mid   Full                                                        +---------+---------------+---------+-----------+----------+--------------+ FV DistalFull                                                         +---------+---------------+---------+-----------+----------+--------------+ PFV      Full                                                        +---------+---------------+---------+-----------+----------+--------------+ POP      Full           Yes      Yes                                 +---------+---------------+---------+-----------+----------+--------------+ PTV      Full                                                        +---------+---------------+---------+-----------+----------+--------------+  PERO     Full                                                        +---------+---------------+---------+-----------+----------+--------------+   +---------+---------------+---------+-----------+----------+--------------+ LEFT     CompressibilityPhasicitySpontaneityPropertiesThrombus Aging +---------+---------------+---------+-----------+----------+--------------+ CFV      Full           Yes      Yes                                 +---------+---------------+---------+-----------+----------+--------------+ SFJ      Full                                                        +---------+---------------+---------+-----------+----------+--------------+ FV Prox  Full                                                        +---------+---------------+---------+-----------+----------+--------------+ FV Mid   Full                                                        +---------+---------------+---------+-----------+----------+--------------+ FV DistalFull                                                        +---------+---------------+---------+-----------+----------+--------------+ PFV      Full                                                        +---------+---------------+---------+-----------+----------+--------------+ POP      Full           Yes      Yes                                  +---------+---------------+---------+-----------+----------+--------------+ PTV      Full                                                        +---------+---------------+---------+-----------+----------+--------------+ PERO     Full                                                        +---------+---------------+---------+-----------+----------+--------------+  Summary: BILATERAL: - No evidence of deep vein thrombosis seen in the lower extremities, bilaterally. -No evidence of popliteal cyst, bilaterally.   *See table(s) above for measurements and observations. Electronically signed by Deitra Mayo MD on 01/11/2021 at 1:55:09 PM.    Final      The results of significant diagnostics from this hospitalization (including imaging, microbiology, ancillary and laboratory) are listed below for reference.     Microbiology: Recent Results (from the past 240 hour(s))  SARS CORONAVIRUS 2 (TAT 6-24 HRS) Nasopharyngeal Nasopharyngeal Swab     Status: None   Collection Time: 01/23/21 12:56 PM   Specimen: Nasopharyngeal Swab  Result Value Ref Range Status   SARS Coronavirus 2 NEGATIVE NEGATIVE Final    Comment: (NOTE) SARS-CoV-2 target nucleic acids are NOT DETECTED.  The SARS-CoV-2 RNA is generally detectable in upper and lower respiratory specimens during the acute phase of infection. Negative results do not preclude SARS-CoV-2 infection, do not rule out co-infections with other pathogens, and should not be used as the sole basis for treatment or other patient management decisions. Negative results must be combined with clinical observations, patient history, and epidemiological information. The expected result is Negative.  Fact Sheet for Patients: SugarRoll.be  Fact Sheet for Healthcare Providers: https://www.woods-mathews.com/  This test is not yet approved or cleared by the [place] FDA and  has been authorized  for detection and/or diagnosis of SARS-CoV-2 by FDA under an Emergency Use Authorization (EUA). This EUA will remain  in effect (meaning this test can be used) for the duration of the COVID-19 declaration under Se ction 564(b)(1) of the Act, 21 U.S.C. section 360bbb-3(b)(1), unless the authorization is terminated or revoked sooner.  Performed at [facility], [place] [address] [place], [place] [address]      Labs: BNP (last 3 results) Recent Labs    01/10/21 2317  BNP 79.0   Basic Metabolic Panel: Recent Labs  Lab 01/22/21 0816 01/23/21 0900 01/24/21 0736 01/25/21 0123 01/26/21 0208  NA 134* 133* 134* 135 133*  K 4.6 4.6 4.7 4.6 4.4  CL 98 100 101 101 99  CO2 _0 GLUCOSE 170* 182* 142* 159* 183*  BUN 46* 39* 38* 30* 27*  CREATININE 2.87* 2.76* 2.68* 2.62* 2.53*  CALCIUM 8.7* 8.4* 8.3* 8.5* 8.5*   Liver Function Tests: No results for input(s): AST, ALT, ALKPHOS, BILITOT, PROT, ALBUMIN in the last 168 hours. No results for input(s): LIPASE, AMYLASE in the last 168 hours. No results for input(s): AMMONIA in the last 168 hours. CBC: Recent Labs  Lab 01/22/21 0221 01/23/21 0052 01/24/21 0141 01/25/21 0123 01/26/21 0208  WBC 13.6* 12.0* 11.2* 9.4 8.4  HGB 7.4* 8.6* 8.4* 9.0* 8.8*  HCT 23.6* 27.1* 26.3* 28.1* 28.1*  MCV 85.8 87.1 87.4 88.4 88.9  PLT 381 392 383 399 442*   Cardiac Enzymes: No results for input(s): CKTOTAL, CKMB, CKMBINDEX, TROPONINI in the last 168 hours. BNP: Invalid input(s): POCBNP CBG: Recent Labs  Lab 01/26/21 0635 01/26/21 1130 01/26/21 1616 01/26/21 2033 01/27/21 0608  GLUCAP 189* 217* 221* 203* 170*   D-Dimer No results for input(s): DDIMER in the last 72 hours. Hgb A1c No results for input(s): HGBA1C in the last 72 hours. Lipid Profile No results for input(s): CHOL, HDL, LDLCALC, TRIG, CHOLHDL, LDLDIRECT in the last 72 hours. Thyroid function studies No results for input(s): TSH, T4TOTAL, T3FREE, THYROIDAB in  the last 72 hours.  Invalid input(s): FREET3 Anemia work up No results  for input(s): VITAMINB12, FOLATE, FERRITIN, TIBC, IRON, RETICCTPCT in the last 72 hours. Urinalysis    Component Value Date/Time   COLORURINE YELLOW 01/13/2021 1630   APPEARANCEUR CLOUDY (A) 01/13/2021 1630   LABSPEC 1.015 01/13/2021 1630   PHURINE 6.0 01/13/2021 1630   GLUCOSEU NEGATIVE 01/13/2021 1630   HGBUR SMALL (A) 01/13/2021 1630   BILIRUBINUR NEGATIVE 01/13/2021 1630   KETONESUR NEGATIVE 01/13/2021 1630   PROTEINUR NEGATIVE 01/13/2021 1630   NITRITE NEGATIVE 01/13/2021 1630   LEUKOCYTESUR SMALL (A) 01/13/2021 1630   Sepsis Labs Invalid input(s): PROCALCITONIN,  WBC,  LACTICIDVEN Microbiology Recent Results (from the past 240 hour(s))  SARS CORONAVIRUS 2 (TAT 6-24 HRS) Nasopharyngeal Nasopharyngeal Swab     Status: None   Collection Time: 01/23/21 12:56 PM   Specimen: Nasopharyngeal Swab  Result Value Ref Range Status   SARS Coronavirus 2 NEGATIVE NEGATIVE Final    Comment: (NOTE) SARS-CoV-2 target nucleic acids are NOT DETECTED.  The SARS-CoV-2 RNA is generally detectable in upper and lower respiratory specimens during the acute phase of infection. Negative results do not preclude SARS-CoV-2 infection, do not rule out co-infections with other pathogens, and should not be used as the sole basis for treatment or other patient management decisions. Negative results must be combined with clinical observations, patient history, and epidemiological information. The expected result is Negative.  Fact Sheet for Patients: SugarRoll.be  Fact Sheet for Healthcare Providers: https://www.woods-mathews.com/  This test is not yet approved or cleared by the [place] FDA and  has been authorized for detection and/or diagnosis of SARS-CoV-2 by FDA under an Emergency Use Authorization (EUA). This EUA will remain  in effect (meaning this test can be used) for the  duration of the COVID-19 declaration under Se ction 564(b)(1) of the Act, 21 U.S.C. section 360bbb-3(b)(1), unless the authorization is terminated or revoked sooner.  Performed at [facility], [place] [address] [place], [place] [address]      Time coordinating discharge in minutes: 65  SIGNED:   Debbe Odea, MD  [facility] 01/27/2021, 7:30 AM

## 2021-01-27 NOTE — Progress Notes (Signed)
Mobility Specialist Progress Note   01/27/21 1200  Mobility  Activity Refused mobility   Pt refused mobility d/t soreness in feet and preparing for d/c  Holland Falling Mobility Specialist Phone Number [phone]

## 2021-01-27 NOTE — Progress Notes (Signed)
This patient was discharged on 01/26/21. She is being transitioned to a SNF today. Please see my updated dc summary from 01/26/21. I have evaluated the patient today and she remains stable for discharge.

## 2021-01-27 NOTE — TOC Transition Note (Signed)
Transition of Care [facility]) - CM/SW Discharge Note   Patient Details  Name: Kathleen [name] MRN: [id number] Date of Birth: 09-18-1950  Transition of Care [facility]) CM/SW Contact:  Vinie Sill, LCSW Phone Number: 01/27/2021, 10:01 AM   Clinical Narrative:     Patient will Discharge to: [facility] Discharge Date: 01/27/2021 Family Notified: patient decline- states she will call family Transport By: Corey Harold  Per MD patient is ready for discharge. RN, patient, and facility notified of discharge. Discharge Summary sent to facility. RN given number for [phone], Room B15 bed 1. Ambulance transport requested for patient.   Clinical Social Worker signing off.  Thurmond Butts, MSW, LCSW Clinical Social Worker     Final next level of care: Skilled Nursing Facility Barriers to Discharge: Barriers Resolved   Patient Goals and CMS Choice        Discharge Placement              Patient chooses bed at:  [facility]) Patient to be transferred to facility by: [place] Name of family member notified: patient states she will call family Patient and family notified of of transfer: 01/27/21  Discharge Plan and Services In-house Referral: Clinical Social Work                                   Social Determinants of Health (SDOH) Interventions     Readmission Risk Interventions No flowsheet data found.

## 2021-01-27 NOTE — Progress Notes (Signed)
Called RN at [facility] and gave report. All questions answered. [facility] transported pt to facility. Pt resting with call bell within reach.  Will continue to monitor.

## 2021-01-29 DIAGNOSIS — K219 Gastro-esophageal reflux disease without esophagitis: Secondary | ICD-10-CM | POA: Diagnosis not present

## 2021-01-29 DIAGNOSIS — E1169 Type 2 diabetes mellitus with other specified complication: Secondary | ICD-10-CM | POA: Diagnosis not present

## 2021-01-29 DIAGNOSIS — N183 Chronic kidney disease, stage 3 unspecified: Secondary | ICD-10-CM | POA: Diagnosis not present

## 2021-01-29 DIAGNOSIS — I2699 Other pulmonary embolism without acute cor pulmonale: Secondary | ICD-10-CM | POA: Diagnosis not present

## 2021-01-29 DIAGNOSIS — E785 Hyperlipidemia, unspecified: Secondary | ICD-10-CM | POA: Diagnosis not present

## 2021-01-29 DIAGNOSIS — I10 Essential (primary) hypertension: Secondary | ICD-10-CM | POA: Diagnosis not present

## 2021-02-06 DIAGNOSIS — N179 Acute kidney failure, unspecified: Secondary | ICD-10-CM | POA: Diagnosis not present

## 2021-02-06 DIAGNOSIS — R5381 Other malaise: Secondary | ICD-10-CM | POA: Diagnosis not present

## 2021-02-06 DIAGNOSIS — I2699 Other pulmonary embolism without acute cor pulmonale: Secondary | ICD-10-CM | POA: Diagnosis not present

## 2021-02-09 ENCOUNTER — Telehealth: Payer: Self-pay

## 2021-02-09 NOTE — Telephone Encounter (Signed)
Eric from [facility] called to let Dr Moshe Cipro know she was discharged from [facility] home on Friday, 09/30.20022.  Will Dr Moshe Cipro be continuing her care and sign off on papers.  Call back # [phone]

## 2021-02-10 NOTE — Telephone Encounter (Signed)
Aware that she will be continuing her care with [facility]

## 2021-02-11 DIAGNOSIS — I10 Essential (primary) hypertension: Secondary | ICD-10-CM | POA: Diagnosis not present

## 2021-02-11 DIAGNOSIS — I129 Hypertensive chronic kidney disease with stage 1 through stage 4 chronic kidney disease, or unspecified chronic kidney disease: Secondary | ICD-10-CM | POA: Diagnosis not present

## 2021-02-11 DIAGNOSIS — Z7982 Long term (current) use of aspirin: Secondary | ICD-10-CM | POA: Diagnosis not present

## 2021-02-11 DIAGNOSIS — K219 Gastro-esophageal reflux disease without esophagitis: Secondary | ICD-10-CM | POA: Diagnosis not present

## 2021-02-11 DIAGNOSIS — M1990 Unspecified osteoarthritis, unspecified site: Secondary | ICD-10-CM | POA: Diagnosis not present

## 2021-02-11 DIAGNOSIS — Z9181 History of falling: Secondary | ICD-10-CM | POA: Diagnosis not present

## 2021-02-11 DIAGNOSIS — G4700 Insomnia, unspecified: Secondary | ICD-10-CM | POA: Diagnosis not present

## 2021-02-11 DIAGNOSIS — I2699 Other pulmonary embolism without acute cor pulmonale: Secondary | ICD-10-CM | POA: Diagnosis not present

## 2021-02-11 DIAGNOSIS — N1832 Chronic kidney disease, stage 3b: Secondary | ICD-10-CM | POA: Diagnosis not present

## 2021-02-11 DIAGNOSIS — E1122 Type 2 diabetes mellitus with diabetic chronic kidney disease: Secondary | ICD-10-CM | POA: Diagnosis not present

## 2021-02-11 DIAGNOSIS — Z7901 Long term (current) use of anticoagulants: Secondary | ICD-10-CM | POA: Diagnosis not present

## 2021-02-11 DIAGNOSIS — E785 Hyperlipidemia, unspecified: Secondary | ICD-10-CM | POA: Diagnosis not present

## 2021-02-11 DIAGNOSIS — E041 Nontoxic single thyroid nodule: Secondary | ICD-10-CM | POA: Diagnosis not present

## 2021-02-11 DIAGNOSIS — Z7984 Long term (current) use of oral hypoglycemic drugs: Secondary | ICD-10-CM | POA: Diagnosis not present

## 2021-02-11 DIAGNOSIS — R1310 Dysphagia, unspecified: Secondary | ICD-10-CM | POA: Diagnosis not present

## 2021-02-11 DIAGNOSIS — Z794 Long term (current) use of insulin: Secondary | ICD-10-CM | POA: Diagnosis not present

## 2021-02-18 DIAGNOSIS — E041 Nontoxic single thyroid nodule: Secondary | ICD-10-CM | POA: Diagnosis not present

## 2021-02-18 DIAGNOSIS — Z7901 Long term (current) use of anticoagulants: Secondary | ICD-10-CM | POA: Diagnosis not present

## 2021-02-18 DIAGNOSIS — Z9181 History of falling: Secondary | ICD-10-CM | POA: Diagnosis not present

## 2021-02-18 DIAGNOSIS — I129 Hypertensive chronic kidney disease with stage 1 through stage 4 chronic kidney disease, or unspecified chronic kidney disease: Secondary | ICD-10-CM | POA: Diagnosis not present

## 2021-02-18 DIAGNOSIS — Z7984 Long term (current) use of oral hypoglycemic drugs: Secondary | ICD-10-CM | POA: Diagnosis not present

## 2021-02-18 DIAGNOSIS — Z794 Long term (current) use of insulin: Secondary | ICD-10-CM | POA: Diagnosis not present

## 2021-02-18 DIAGNOSIS — M1990 Unspecified osteoarthritis, unspecified site: Secondary | ICD-10-CM | POA: Diagnosis not present

## 2021-02-18 DIAGNOSIS — K219 Gastro-esophageal reflux disease without esophagitis: Secondary | ICD-10-CM | POA: Diagnosis not present

## 2021-02-18 DIAGNOSIS — Z7982 Long term (current) use of aspirin: Secondary | ICD-10-CM | POA: Diagnosis not present

## 2021-02-18 DIAGNOSIS — G4700 Insomnia, unspecified: Secondary | ICD-10-CM | POA: Diagnosis not present

## 2021-02-18 DIAGNOSIS — E785 Hyperlipidemia, unspecified: Secondary | ICD-10-CM | POA: Diagnosis not present

## 2021-02-18 DIAGNOSIS — N1832 Chronic kidney disease, stage 3b: Secondary | ICD-10-CM | POA: Diagnosis not present

## 2021-02-18 DIAGNOSIS — I2699 Other pulmonary embolism without acute cor pulmonale: Secondary | ICD-10-CM | POA: Diagnosis not present

## 2021-02-18 DIAGNOSIS — E1122 Type 2 diabetes mellitus with diabetic chronic kidney disease: Secondary | ICD-10-CM | POA: Diagnosis not present

## 2021-02-18 DIAGNOSIS — R1310 Dysphagia, unspecified: Secondary | ICD-10-CM | POA: Diagnosis not present

## 2021-02-20 ENCOUNTER — Other Ambulatory Visit: Payer: Self-pay | Admitting: Family Medicine

## 2021-02-24 DIAGNOSIS — K219 Gastro-esophageal reflux disease without esophagitis: Secondary | ICD-10-CM | POA: Diagnosis not present

## 2021-02-24 DIAGNOSIS — Z9181 History of falling: Secondary | ICD-10-CM | POA: Diagnosis not present

## 2021-02-24 DIAGNOSIS — N1832 Chronic kidney disease, stage 3b: Secondary | ICD-10-CM | POA: Diagnosis not present

## 2021-02-24 DIAGNOSIS — I2699 Other pulmonary embolism without acute cor pulmonale: Secondary | ICD-10-CM | POA: Diagnosis not present

## 2021-02-24 DIAGNOSIS — Z7984 Long term (current) use of oral hypoglycemic drugs: Secondary | ICD-10-CM | POA: Diagnosis not present

## 2021-02-24 DIAGNOSIS — Z794 Long term (current) use of insulin: Secondary | ICD-10-CM | POA: Diagnosis not present

## 2021-02-24 DIAGNOSIS — G4700 Insomnia, unspecified: Secondary | ICD-10-CM | POA: Diagnosis not present

## 2021-02-24 DIAGNOSIS — E785 Hyperlipidemia, unspecified: Secondary | ICD-10-CM | POA: Diagnosis not present

## 2021-02-24 DIAGNOSIS — Z7982 Long term (current) use of aspirin: Secondary | ICD-10-CM | POA: Diagnosis not present

## 2021-02-24 DIAGNOSIS — M1990 Unspecified osteoarthritis, unspecified site: Secondary | ICD-10-CM | POA: Diagnosis not present

## 2021-02-24 DIAGNOSIS — I129 Hypertensive chronic kidney disease with stage 1 through stage 4 chronic kidney disease, or unspecified chronic kidney disease: Secondary | ICD-10-CM | POA: Diagnosis not present

## 2021-02-24 DIAGNOSIS — Z7901 Long term (current) use of anticoagulants: Secondary | ICD-10-CM | POA: Diagnosis not present

## 2021-02-24 DIAGNOSIS — R1310 Dysphagia, unspecified: Secondary | ICD-10-CM | POA: Diagnosis not present

## 2021-02-24 DIAGNOSIS — E1122 Type 2 diabetes mellitus with diabetic chronic kidney disease: Secondary | ICD-10-CM | POA: Diagnosis not present

## 2021-02-24 DIAGNOSIS — E041 Nontoxic single thyroid nodule: Secondary | ICD-10-CM | POA: Diagnosis not present

## 2021-02-26 DIAGNOSIS — N1832 Chronic kidney disease, stage 3b: Secondary | ICD-10-CM | POA: Diagnosis not present

## 2021-02-26 DIAGNOSIS — R1310 Dysphagia, unspecified: Secondary | ICD-10-CM | POA: Diagnosis not present

## 2021-02-26 DIAGNOSIS — I129 Hypertensive chronic kidney disease with stage 1 through stage 4 chronic kidney disease, or unspecified chronic kidney disease: Secondary | ICD-10-CM | POA: Diagnosis not present

## 2021-02-26 DIAGNOSIS — N189 Chronic kidney disease, unspecified: Secondary | ICD-10-CM | POA: Diagnosis not present

## 2021-02-26 DIAGNOSIS — E785 Hyperlipidemia, unspecified: Secondary | ICD-10-CM | POA: Diagnosis not present

## 2021-02-26 DIAGNOSIS — Z794 Long term (current) use of insulin: Secondary | ICD-10-CM | POA: Diagnosis not present

## 2021-02-26 DIAGNOSIS — E1122 Type 2 diabetes mellitus with diabetic chronic kidney disease: Secondary | ICD-10-CM | POA: Diagnosis not present

## 2021-02-26 DIAGNOSIS — Z7901 Long term (current) use of anticoagulants: Secondary | ICD-10-CM | POA: Diagnosis not present

## 2021-02-26 DIAGNOSIS — I5032 Chronic diastolic (congestive) heart failure: Secondary | ICD-10-CM | POA: Diagnosis not present

## 2021-02-26 DIAGNOSIS — E041 Nontoxic single thyroid nodule: Secondary | ICD-10-CM | POA: Diagnosis not present

## 2021-02-26 DIAGNOSIS — M1990 Unspecified osteoarthritis, unspecified site: Secondary | ICD-10-CM | POA: Diagnosis not present

## 2021-02-26 DIAGNOSIS — Z9181 History of falling: Secondary | ICD-10-CM | POA: Diagnosis not present

## 2021-02-26 DIAGNOSIS — I2699 Other pulmonary embolism without acute cor pulmonale: Secondary | ICD-10-CM | POA: Diagnosis not present

## 2021-02-26 DIAGNOSIS — Z7984 Long term (current) use of oral hypoglycemic drugs: Secondary | ICD-10-CM | POA: Diagnosis not present

## 2021-02-26 DIAGNOSIS — K219 Gastro-esophageal reflux disease without esophagitis: Secondary | ICD-10-CM | POA: Diagnosis not present

## 2021-02-26 DIAGNOSIS — Z7982 Long term (current) use of aspirin: Secondary | ICD-10-CM | POA: Diagnosis not present

## 2021-02-26 DIAGNOSIS — D472 Monoclonal gammopathy: Secondary | ICD-10-CM | POA: Diagnosis not present

## 2021-02-26 DIAGNOSIS — G4700 Insomnia, unspecified: Secondary | ICD-10-CM | POA: Diagnosis not present

## 2021-02-28 DIAGNOSIS — I129 Hypertensive chronic kidney disease with stage 1 through stage 4 chronic kidney disease, or unspecified chronic kidney disease: Secondary | ICD-10-CM | POA: Diagnosis not present

## 2021-02-28 DIAGNOSIS — Z9181 History of falling: Secondary | ICD-10-CM | POA: Diagnosis not present

## 2021-02-28 DIAGNOSIS — E1122 Type 2 diabetes mellitus with diabetic chronic kidney disease: Secondary | ICD-10-CM | POA: Diagnosis not present

## 2021-02-28 DIAGNOSIS — Z7984 Long term (current) use of oral hypoglycemic drugs: Secondary | ICD-10-CM | POA: Diagnosis not present

## 2021-02-28 DIAGNOSIS — E041 Nontoxic single thyroid nodule: Secondary | ICD-10-CM | POA: Diagnosis not present

## 2021-02-28 DIAGNOSIS — E785 Hyperlipidemia, unspecified: Secondary | ICD-10-CM | POA: Diagnosis not present

## 2021-02-28 DIAGNOSIS — I2699 Other pulmonary embolism without acute cor pulmonale: Secondary | ICD-10-CM | POA: Diagnosis not present

## 2021-02-28 DIAGNOSIS — M1990 Unspecified osteoarthritis, unspecified site: Secondary | ICD-10-CM | POA: Diagnosis not present

## 2021-02-28 DIAGNOSIS — Z7901 Long term (current) use of anticoagulants: Secondary | ICD-10-CM | POA: Diagnosis not present

## 2021-02-28 DIAGNOSIS — N1832 Chronic kidney disease, stage 3b: Secondary | ICD-10-CM | POA: Diagnosis not present

## 2021-02-28 DIAGNOSIS — G4700 Insomnia, unspecified: Secondary | ICD-10-CM | POA: Diagnosis not present

## 2021-02-28 DIAGNOSIS — R1310 Dysphagia, unspecified: Secondary | ICD-10-CM | POA: Diagnosis not present

## 2021-02-28 DIAGNOSIS — Z794 Long term (current) use of insulin: Secondary | ICD-10-CM | POA: Diagnosis not present

## 2021-02-28 DIAGNOSIS — K219 Gastro-esophageal reflux disease without esophagitis: Secondary | ICD-10-CM | POA: Diagnosis not present

## 2021-02-28 DIAGNOSIS — Z7982 Long term (current) use of aspirin: Secondary | ICD-10-CM | POA: Diagnosis not present

## 2021-03-01 DIAGNOSIS — Z7901 Long term (current) use of anticoagulants: Secondary | ICD-10-CM | POA: Diagnosis not present

## 2021-03-01 DIAGNOSIS — K219 Gastro-esophageal reflux disease without esophagitis: Secondary | ICD-10-CM | POA: Diagnosis not present

## 2021-03-01 DIAGNOSIS — E041 Nontoxic single thyroid nodule: Secondary | ICD-10-CM | POA: Diagnosis not present

## 2021-03-01 DIAGNOSIS — G4700 Insomnia, unspecified: Secondary | ICD-10-CM | POA: Diagnosis not present

## 2021-03-01 DIAGNOSIS — Z7982 Long term (current) use of aspirin: Secondary | ICD-10-CM | POA: Diagnosis not present

## 2021-03-01 DIAGNOSIS — E1122 Type 2 diabetes mellitus with diabetic chronic kidney disease: Secondary | ICD-10-CM | POA: Diagnosis not present

## 2021-03-01 DIAGNOSIS — Z794 Long term (current) use of insulin: Secondary | ICD-10-CM | POA: Diagnosis not present

## 2021-03-01 DIAGNOSIS — M1990 Unspecified osteoarthritis, unspecified site: Secondary | ICD-10-CM | POA: Diagnosis not present

## 2021-03-01 DIAGNOSIS — I2699 Other pulmonary embolism without acute cor pulmonale: Secondary | ICD-10-CM | POA: Diagnosis not present

## 2021-03-01 DIAGNOSIS — Z9181 History of falling: Secondary | ICD-10-CM | POA: Diagnosis not present

## 2021-03-01 DIAGNOSIS — Z7984 Long term (current) use of oral hypoglycemic drugs: Secondary | ICD-10-CM | POA: Diagnosis not present

## 2021-03-01 DIAGNOSIS — I129 Hypertensive chronic kidney disease with stage 1 through stage 4 chronic kidney disease, or unspecified chronic kidney disease: Secondary | ICD-10-CM | POA: Diagnosis not present

## 2021-03-01 DIAGNOSIS — N1832 Chronic kidney disease, stage 3b: Secondary | ICD-10-CM | POA: Diagnosis not present

## 2021-03-01 DIAGNOSIS — R1310 Dysphagia, unspecified: Secondary | ICD-10-CM | POA: Diagnosis not present

## 2021-03-01 DIAGNOSIS — E785 Hyperlipidemia, unspecified: Secondary | ICD-10-CM | POA: Diagnosis not present

## 2021-03-02 ENCOUNTER — Other Ambulatory Visit: Payer: Self-pay | Admitting: Family Medicine

## 2021-03-02 DIAGNOSIS — E785 Hyperlipidemia, unspecified: Secondary | ICD-10-CM

## 2021-03-04 DIAGNOSIS — N1832 Chronic kidney disease, stage 3b: Secondary | ICD-10-CM | POA: Diagnosis not present

## 2021-03-04 DIAGNOSIS — Z7984 Long term (current) use of oral hypoglycemic drugs: Secondary | ICD-10-CM | POA: Diagnosis not present

## 2021-03-04 DIAGNOSIS — M1990 Unspecified osteoarthritis, unspecified site: Secondary | ICD-10-CM | POA: Diagnosis not present

## 2021-03-04 DIAGNOSIS — I129 Hypertensive chronic kidney disease with stage 1 through stage 4 chronic kidney disease, or unspecified chronic kidney disease: Secondary | ICD-10-CM | POA: Diagnosis not present

## 2021-03-04 DIAGNOSIS — Z794 Long term (current) use of insulin: Secondary | ICD-10-CM | POA: Diagnosis not present

## 2021-03-04 DIAGNOSIS — I2699 Other pulmonary embolism without acute cor pulmonale: Secondary | ICD-10-CM | POA: Diagnosis not present

## 2021-03-04 DIAGNOSIS — E041 Nontoxic single thyroid nodule: Secondary | ICD-10-CM | POA: Diagnosis not present

## 2021-03-04 DIAGNOSIS — Z7982 Long term (current) use of aspirin: Secondary | ICD-10-CM | POA: Diagnosis not present

## 2021-03-04 DIAGNOSIS — E785 Hyperlipidemia, unspecified: Secondary | ICD-10-CM | POA: Diagnosis not present

## 2021-03-04 DIAGNOSIS — G4700 Insomnia, unspecified: Secondary | ICD-10-CM | POA: Diagnosis not present

## 2021-03-04 DIAGNOSIS — E1122 Type 2 diabetes mellitus with diabetic chronic kidney disease: Secondary | ICD-10-CM | POA: Diagnosis not present

## 2021-03-04 DIAGNOSIS — R1310 Dysphagia, unspecified: Secondary | ICD-10-CM | POA: Diagnosis not present

## 2021-03-04 DIAGNOSIS — K219 Gastro-esophageal reflux disease without esophagitis: Secondary | ICD-10-CM | POA: Diagnosis not present

## 2021-03-04 DIAGNOSIS — Z9181 History of falling: Secondary | ICD-10-CM | POA: Diagnosis not present

## 2021-03-04 DIAGNOSIS — Z7901 Long term (current) use of anticoagulants: Secondary | ICD-10-CM | POA: Diagnosis not present

## 2021-03-05 DIAGNOSIS — R1310 Dysphagia, unspecified: Secondary | ICD-10-CM | POA: Diagnosis not present

## 2021-03-05 DIAGNOSIS — Z7982 Long term (current) use of aspirin: Secondary | ICD-10-CM | POA: Diagnosis not present

## 2021-03-05 DIAGNOSIS — M1990 Unspecified osteoarthritis, unspecified site: Secondary | ICD-10-CM | POA: Diagnosis not present

## 2021-03-05 DIAGNOSIS — E041 Nontoxic single thyroid nodule: Secondary | ICD-10-CM | POA: Diagnosis not present

## 2021-03-05 DIAGNOSIS — I2699 Other pulmonary embolism without acute cor pulmonale: Secondary | ICD-10-CM | POA: Diagnosis not present

## 2021-03-05 DIAGNOSIS — K219 Gastro-esophageal reflux disease without esophagitis: Secondary | ICD-10-CM | POA: Diagnosis not present

## 2021-03-05 DIAGNOSIS — I129 Hypertensive chronic kidney disease with stage 1 through stage 4 chronic kidney disease, or unspecified chronic kidney disease: Secondary | ICD-10-CM | POA: Diagnosis not present

## 2021-03-05 DIAGNOSIS — Z7984 Long term (current) use of oral hypoglycemic drugs: Secondary | ICD-10-CM | POA: Diagnosis not present

## 2021-03-05 DIAGNOSIS — Z9181 History of falling: Secondary | ICD-10-CM | POA: Diagnosis not present

## 2021-03-05 DIAGNOSIS — G4700 Insomnia, unspecified: Secondary | ICD-10-CM | POA: Diagnosis not present

## 2021-03-05 DIAGNOSIS — Z7901 Long term (current) use of anticoagulants: Secondary | ICD-10-CM | POA: Diagnosis not present

## 2021-03-05 DIAGNOSIS — Z794 Long term (current) use of insulin: Secondary | ICD-10-CM | POA: Diagnosis not present

## 2021-03-05 DIAGNOSIS — N1832 Chronic kidney disease, stage 3b: Secondary | ICD-10-CM | POA: Diagnosis not present

## 2021-03-05 DIAGNOSIS — E785 Hyperlipidemia, unspecified: Secondary | ICD-10-CM | POA: Diagnosis not present

## 2021-03-05 DIAGNOSIS — E1122 Type 2 diabetes mellitus with diabetic chronic kidney disease: Secondary | ICD-10-CM | POA: Diagnosis not present

## 2021-03-06 DIAGNOSIS — I129 Hypertensive chronic kidney disease with stage 1 through stage 4 chronic kidney disease, or unspecified chronic kidney disease: Secondary | ICD-10-CM | POA: Diagnosis not present

## 2021-03-06 DIAGNOSIS — D472 Monoclonal gammopathy: Secondary | ICD-10-CM | POA: Diagnosis not present

## 2021-03-06 DIAGNOSIS — E1122 Type 2 diabetes mellitus with diabetic chronic kidney disease: Secondary | ICD-10-CM | POA: Diagnosis not present

## 2021-03-06 DIAGNOSIS — I2699 Other pulmonary embolism without acute cor pulmonale: Secondary | ICD-10-CM | POA: Diagnosis not present

## 2021-03-06 DIAGNOSIS — E875 Hyperkalemia: Secondary | ICD-10-CM | POA: Diagnosis not present

## 2021-03-06 DIAGNOSIS — I469 Cardiac arrest, cause unspecified: Secondary | ICD-10-CM | POA: Diagnosis not present

## 2021-03-06 DIAGNOSIS — E559 Vitamin D deficiency, unspecified: Secondary | ICD-10-CM | POA: Diagnosis not present

## 2021-03-06 DIAGNOSIS — N189 Chronic kidney disease, unspecified: Secondary | ICD-10-CM | POA: Diagnosis not present

## 2021-03-06 DIAGNOSIS — N170 Acute kidney failure with tubular necrosis: Secondary | ICD-10-CM | POA: Diagnosis not present

## 2021-03-06 DIAGNOSIS — I5032 Chronic diastolic (congestive) heart failure: Secondary | ICD-10-CM | POA: Diagnosis not present

## 2021-03-06 DIAGNOSIS — D638 Anemia in other chronic diseases classified elsewhere: Secondary | ICD-10-CM | POA: Diagnosis not present

## 2021-03-09 ENCOUNTER — Other Ambulatory Visit: Payer: Self-pay | Admitting: Family Medicine

## 2021-03-09 ENCOUNTER — Other Ambulatory Visit: Payer: Self-pay | Admitting: Internal Medicine

## 2021-03-11 ENCOUNTER — Telehealth: Payer: Self-pay | Admitting: Family Medicine

## 2021-03-11 NOTE — Telephone Encounter (Signed)
FYI- ok to refill eliquis?

## 2021-03-11 NOTE — Telephone Encounter (Signed)
Pt called in to give update on self status. Pt ended up being admitted again to  hospital end of Aug early Sept  for blood clot. 1st to [facility] and then sent to [facility] ? Once DC  pt then went into [facility] here [place].   Pt is having a hard time getting her Blood Thinner refilled and pt only has 3 or so pills left  Eliquis needed

## 2021-03-12 ENCOUNTER — Other Ambulatory Visit: Payer: Self-pay

## 2021-03-12 DIAGNOSIS — E1122 Type 2 diabetes mellitus with diabetic chronic kidney disease: Secondary | ICD-10-CM | POA: Diagnosis not present

## 2021-03-12 DIAGNOSIS — E785 Hyperlipidemia, unspecified: Secondary | ICD-10-CM | POA: Diagnosis not present

## 2021-03-12 DIAGNOSIS — Z9181 History of falling: Secondary | ICD-10-CM | POA: Diagnosis not present

## 2021-03-12 DIAGNOSIS — I129 Hypertensive chronic kidney disease with stage 1 through stage 4 chronic kidney disease, or unspecified chronic kidney disease: Secondary | ICD-10-CM | POA: Diagnosis not present

## 2021-03-12 DIAGNOSIS — Z7982 Long term (current) use of aspirin: Secondary | ICD-10-CM | POA: Diagnosis not present

## 2021-03-12 DIAGNOSIS — G4700 Insomnia, unspecified: Secondary | ICD-10-CM | POA: Diagnosis not present

## 2021-03-12 DIAGNOSIS — I2699 Other pulmonary embolism without acute cor pulmonale: Secondary | ICD-10-CM | POA: Diagnosis not present

## 2021-03-12 DIAGNOSIS — Z7984 Long term (current) use of oral hypoglycemic drugs: Secondary | ICD-10-CM | POA: Diagnosis not present

## 2021-03-12 DIAGNOSIS — N1832 Chronic kidney disease, stage 3b: Secondary | ICD-10-CM | POA: Diagnosis not present

## 2021-03-12 DIAGNOSIS — Z7901 Long term (current) use of anticoagulants: Secondary | ICD-10-CM | POA: Diagnosis not present

## 2021-03-12 DIAGNOSIS — M1990 Unspecified osteoarthritis, unspecified site: Secondary | ICD-10-CM | POA: Diagnosis not present

## 2021-03-12 DIAGNOSIS — R1310 Dysphagia, unspecified: Secondary | ICD-10-CM | POA: Diagnosis not present

## 2021-03-12 DIAGNOSIS — Z794 Long term (current) use of insulin: Secondary | ICD-10-CM | POA: Diagnosis not present

## 2021-03-12 DIAGNOSIS — E041 Nontoxic single thyroid nodule: Secondary | ICD-10-CM | POA: Diagnosis not present

## 2021-03-12 DIAGNOSIS — K219 Gastro-esophageal reflux disease without esophagitis: Secondary | ICD-10-CM | POA: Diagnosis not present

## 2021-03-12 MED ORDER — APIXABAN 5 MG PO TABS
5.0000 mg | ORAL_TABLET | Freq: Two times a day (BID) | ORAL | 1 refills | Status: DC
Start: 2021-03-12 — End: 2021-04-10

## 2021-03-12 NOTE — Telephone Encounter (Signed)
Pt aware.

## 2021-03-26 ENCOUNTER — Ambulatory Visit (INDEPENDENT_AMBULATORY_CARE_PROVIDER_SITE_OTHER): Payer: Medicare Other | Admitting: Family Medicine

## 2021-03-26 ENCOUNTER — Encounter: Payer: Self-pay | Admitting: Family Medicine

## 2021-03-26 ENCOUNTER — Other Ambulatory Visit: Payer: Self-pay

## 2021-03-26 VITALS — BP 152/78 | HR 80 | Resp 17 | Ht 66.0 in | Wt 213.1 lb

## 2021-03-26 DIAGNOSIS — E041 Nontoxic single thyroid nodule: Secondary | ICD-10-CM | POA: Diagnosis not present

## 2021-03-26 DIAGNOSIS — E1169 Type 2 diabetes mellitus with other specified complication: Secondary | ICD-10-CM

## 2021-03-26 DIAGNOSIS — Z0001 Encounter for general adult medical examination with abnormal findings: Secondary | ICD-10-CM

## 2021-03-26 DIAGNOSIS — I10 Essential (primary) hypertension: Secondary | ICD-10-CM | POA: Diagnosis not present

## 2021-03-26 MED ORDER — GLIPIZIDE ER 2.5 MG PO TB24
2.5000 mg | ORAL_TABLET | Freq: Every day | ORAL | 3 refills | Status: DC
Start: 2021-03-26 — End: 2021-06-26

## 2021-03-26 MED ORDER — OLMESARTAN MEDOXOMIL 20 MG PO TABS: 20.0000 mg | ORAL_TABLET | Freq: Every day | ORAL | 3 refills | Status: DC

## 2021-03-26 MED ORDER — CLONIDINE HCL 0.3 MG PO TABS: 0.3000 mg | ORAL_TABLET | Freq: Three times a day (TID) | ORAL | 3 refills | Status: DC

## 2021-03-26 MED ORDER — ROSUVASTATIN CALCIUM 5 MG PO TABS: 5.0000 mg | ORAL_TABLET | Freq: Every day | ORAL | 5 refills | Status: DC

## 2021-03-26 MED ORDER — SPIRONOLACTONE-HCTZ 25-25 MG PO TABS: 1.0000 | ORAL_TABLET | Freq: Every day | ORAL | 3 refills | Status: DC

## 2021-03-26 NOTE — Progress Notes (Signed)
    Kathleen [name]     MRN: [id number]      DOB: September 06, 1950  HPI: Patient is in for annual physical exam. Medication management post hospitalization and SNF placement are reviewed, hospitalized with massive bilateral PE Needs ENT referral for thyroid growth. Recent labs,  are reviewed. Immunization is reviewed , and  updated if needed.   PE: BP (!) 152/78   Pulse 80   Resp 17   Ht 5\' 6"  (1.676 m)   Wt 213 lb 1.9 oz (96.7 kg)   SpO2 97%   BMI 34.40 kg/m   Pleasant  female, alert and oriented x 3, in no cardio-pulmonary distress. Afebrile. HEENT No facial trauma or asymetry. Sinuses non tender.  Extra occullar muscles intact.. External ears normal, . Neck: supple, no adenopathy,JVD positive  thyromegaly.No bruits.  Chest: Clear to ascultation bilaterally.No crackles or wheezes. Non tender to palpation    Cardiovascular system; Heart sounds normal,  S1 and  S2 ,no S3.  No murmur, or thrill. Apical beat not displaced Peripheral pulses normal.  Abdomen: Soft, non tender, no organomegaly or masses. No bruits. Bowel sounds normal. No guarding, tenderness or rebound.     Musculoskeletal exam: Decreased though adequate  ROM of spine, hips , shoulders and knees.  deformity ,swelling or crepitus noted. No muscle wasting or atrophy.   Neurologic: Cranial nerves 2 to 12 intact. Power, tone ,sensation  normal throughout. disturbance in gait. No tremor.  Skin: Intact, no ulceration, erythema , scaling or rash noted. Pigmentation normal throughout  Psych; Normal mood and affect. Judgement and concentration normal   Assessment & Plan:  Annual visit for general adult medical examination with abnormal findings Annual exam as documented. Counseling done  re healthy lifestyle involving commitment to 150 minutes exercise per week, heart healthy diet, and attaining healthy weight.The importance of adequate sleep also discussed. Regular seat belt use and home safety,  is also discussed.  Immunization and cancer screening needs are specifically addressed at this visit.   Thyroid nodule Needs ENT assessment based on imaging in 2021, rept [place] and refer  Essential hypertension Uncontrolled, add olmesartan DASH diet and commitment to daily physical activity for a minimum of 30 minutes discussed and encouraged, as a part of hypertension management. The importance of attaining a healthy weight is also discussed.  BP/Weight 03/26/2021 01/27/2021 01/22/2021 01/08/2021 01/02/2021 12/26/2020 07/23/1759  Systolic BP 607 371 - 062 694 854 627  Diastolic BP 78 65 - 66 54 78 78  Wt. (Lbs) 213.12 - 223.77 218 219 218.6 220  BMI 34.4 - 36.12 35.19 35.35 35.28 35.51

## 2021-03-26 NOTE — Patient Instructions (Addendum)
Follow-up first week in January to reevaluate blood pressure and diabetes.  Call if you need me sooner.  Please bring all medications that you are taking to your next visit like you did today New medication for blood pressure is olmesartan 20 mg 1 daily.  Please take medications as listed and we have reviewed at this visit. CBC, chem 7 and EGFr today   Fasting lipid CMP and EGFR HbA1c 5 days before next visit.  You will be referred to ENT.  "Diabetic foot exam at next visit.  You need the COVID booster.  Please schedule.   Thanks for choosing [facility], we consider it a privelige to serve you.

## 2021-03-27 ENCOUNTER — Other Ambulatory Visit: Payer: Self-pay

## 2021-03-27 DIAGNOSIS — E875 Hyperkalemia: Secondary | ICD-10-CM

## 2021-03-27 LAB — BMP8+EGFR
BUN/Creatinine Ratio: 20 (ref 12–28)
BUN: 34 mg/dL — ABNORMAL HIGH (ref 8–27)
CO2: 19 mmol/L — ABNORMAL LOW (ref 20–29)
Calcium: 10.1 mg/dL (ref 8.7–10.3)
Chloride: 105 mmol/L (ref 96–106)
Creatinine, Ser: 1.7 mg/dL — ABNORMAL HIGH (ref 0.57–1.00)
Glucose: 69 mg/dL — ABNORMAL LOW (ref 70–99)
Potassium: 6 mmol/L — ABNORMAL HIGH (ref 3.5–5.2)
Sodium: 139 mmol/L (ref 134–144)
eGFR: 32 mL/min/{1.73_m2} — ABNORMAL LOW (ref >59)

## 2021-03-27 LAB — CBC
Hematocrit: 35.2 % (ref 34.0–46.6)
Hemoglobin: 10.8 g/dL — ABNORMAL LOW (ref 11.1–15.9)
MCH: 26.7 pg (ref 26.6–33.0)
MCHC: 30.7 g/dL — ABNORMAL LOW (ref 31.5–35.7)
MCV: 87 fL (ref 79–97)
Platelets: 355 x10E3/uL (ref 150–450)
RBC: 4.05 x10E6/uL (ref 3.77–5.28)
RDW: 13 % (ref 11.7–15.4)
WBC: 7.8 x10E3/uL (ref 3.4–10.8)

## 2021-03-27 MED ORDER — SODIUM POLYSTYRENE SULFONATE 15 GM/60ML PO SUSP: ORAL | 0 refills | Status: DC

## 2021-03-29 ENCOUNTER — Encounter: Payer: Self-pay | Admitting: Family Medicine

## 2021-03-29 NOTE — Assessment & Plan Note (Signed)
Uncontrolled, add olmesartan DASH diet and commitment to daily physical activity for a minimum of 30 minutes discussed and encouraged, as a part of hypertension management. The importance of attaining a healthy weight is also discussed.  BP/Weight 03/26/2021 01/27/2021 01/22/2021 01/08/2021 01/02/2021 12/26/2020 08/09/270  Systolic BP 536 644 - 034 742 595 638  Diastolic BP 78 65 - 66 54 78 78  Wt. (Lbs) 213.12 - 223.77 218 219 218.6 220  BMI 34.4 - 36.12 35.19 35.35 35.28 35.51

## 2021-03-29 NOTE — Assessment & Plan Note (Signed)
Annual exam as documented. Counseling done  re healthy lifestyle involving commitment to 150 minutes exercise per week, heart healthy diet, and attaining healthy weight.The importance of adequate sleep also discussed. Regular seat belt use and home safety, is also discussed.  Immunization and cancer screening needs are specifically addressed at this visit.

## 2021-03-29 NOTE — Assessment & Plan Note (Addendum)
Needs ENT assessment based on imaging in 2021, rept [place] and refer

## 2021-03-30 DIAGNOSIS — E875 Hyperkalemia: Secondary | ICD-10-CM | POA: Diagnosis not present

## 2021-03-31 ENCOUNTER — Other Ambulatory Visit: Payer: Self-pay | Admitting: Family Medicine

## 2021-03-31 ENCOUNTER — Other Ambulatory Visit: Payer: Self-pay

## 2021-03-31 DIAGNOSIS — E875 Hyperkalemia: Secondary | ICD-10-CM

## 2021-03-31 LAB — BMP8+EGFR
BUN/Creatinine Ratio: 19 (ref 12–28)
BUN: 34 mg/dL — ABNORMAL HIGH (ref 8–27)
CO2: 21 mmol/L (ref 20–29)
Calcium: 10 mg/dL (ref 8.7–10.3)
Chloride: 107 mmol/L — ABNORMAL HIGH (ref 96–106)
Creatinine, Ser: 1.79 mg/dL — ABNORMAL HIGH (ref 0.57–1.00)
Glucose: 56 mg/dL — ABNORMAL LOW (ref 70–99)
Potassium: 5.9 mmol/L — ABNORMAL HIGH (ref 3.5–5.2)
Sodium: 138 mmol/L (ref 134–144)
eGFR: 30 mL/min/{1.73_m2} — ABNORMAL LOW (ref >59)

## 2021-03-31 MED ORDER — SODIUM POLYSTYRENE SULFONATE 15 GM/60ML PO SUSP: ORAL | 1 refills | Status: DC

## 2021-03-31 MED ORDER — HYDROCHLOROTHIAZIDE 25 MG PO TABS: 25.0000 mg | ORAL_TABLET | Freq: Every day | ORAL | 3 refills | Status: DC

## 2021-03-31 MED ORDER — SODIUM POLYSTYRENE SULFONATE 15 GM/60ML PO SUSP: ORAL | 0 refills | Status: DC

## 2021-03-31 NOTE — Progress Notes (Signed)
Rept kaxexalate, stop spironolsctone chem 7 on 11/25 as stat at hospital

## 2021-04-06 ENCOUNTER — Other Ambulatory Visit (HOSPITAL_COMMUNITY)
Admission: RE | Admit: 2021-04-06 | Discharge: 2021-04-06 | Disposition: A | Discharge status: Home / Self Care | Payer: Medicare Other | Source: Ambulatory Visit | Attending: Family Medicine | Admitting: Family Medicine

## 2021-04-06 ENCOUNTER — Ambulatory Visit (HOSPITAL_COMMUNITY): Payer: Medicare Other

## 2021-04-06 DIAGNOSIS — E785 Hyperlipidemia, unspecified: Secondary | ICD-10-CM | POA: Diagnosis present

## 2021-04-06 DIAGNOSIS — E875 Hyperkalemia: Secondary | ICD-10-CM | POA: Diagnosis not present

## 2021-04-06 LAB — BASIC METABOLIC PANEL
Anion gap: 6 (ref 5–15)
BUN: 30 mg/dL — ABNORMAL HIGH (ref 8–23)
CO2: 22 mmol/L (ref 22–32)
Calcium: 9.4 mg/dL (ref 8.9–10.3)
Chloride: 107 mmol/L (ref 98–111)
Creatinine, Ser: 1.77 mg/dL — ABNORMAL HIGH (ref 0.44–1.00)
GFR, Estimated: 30 mL/min — ABNORMAL LOW (ref >60)
Glucose, Bld: 107 mg/dL — ABNORMAL HIGH (ref 70–99)
Potassium: 5.2 mmol/L — ABNORMAL HIGH (ref 3.5–5.1)
Sodium: 135 mmol/L (ref 135–145)

## 2021-04-10 ENCOUNTER — Other Ambulatory Visit: Payer: Self-pay | Admitting: Family Medicine

## 2021-05-05 DIAGNOSIS — N189 Chronic kidney disease, unspecified: Secondary | ICD-10-CM | POA: Diagnosis not present

## 2021-05-05 DIAGNOSIS — I129 Hypertensive chronic kidney disease with stage 1 through stage 4 chronic kidney disease, or unspecified chronic kidney disease: Secondary | ICD-10-CM | POA: Diagnosis not present

## 2021-05-05 DIAGNOSIS — N170 Acute kidney failure with tubular necrosis: Secondary | ICD-10-CM | POA: Diagnosis not present

## 2021-05-05 DIAGNOSIS — E875 Hyperkalemia: Secondary | ICD-10-CM | POA: Diagnosis not present

## 2021-05-05 DIAGNOSIS — E1122 Type 2 diabetes mellitus with diabetic chronic kidney disease: Secondary | ICD-10-CM | POA: Diagnosis not present

## 2021-05-11 ENCOUNTER — Other Ambulatory Visit: Payer: Self-pay | Admitting: Family Medicine

## 2021-05-14 ENCOUNTER — Telehealth: Payer: Self-pay

## 2021-05-14 ENCOUNTER — Observation Stay (HOSPITAL_COMMUNITY)
Admission: EM | Admit: 2021-05-14 | Discharge: 2021-05-15 | Disposition: A | Discharge status: Home / Self Care | Payer: Medicare Other | Attending: Family Medicine | Admitting: Family Medicine

## 2021-05-14 ENCOUNTER — Other Ambulatory Visit: Payer: Self-pay

## 2021-05-14 ENCOUNTER — Encounter (HOSPITAL_COMMUNITY): Payer: Self-pay | Admitting: *Deleted

## 2021-05-14 ENCOUNTER — Other Ambulatory Visit (HOSPITAL_COMMUNITY)
Admission: RE | Admit: 2021-05-14 | Discharge: 2021-05-14 | Disposition: A | Discharge status: Home / Self Care | Payer: Medicare Other | Source: Ambulatory Visit | Attending: Nephrology | Admitting: Nephrology

## 2021-05-14 DIAGNOSIS — I1A0 Resistant hypertension: Secondary | ICD-10-CM | POA: Diagnosis present

## 2021-05-14 DIAGNOSIS — Z7982 Long term (current) use of aspirin: Secondary | ICD-10-CM | POA: Diagnosis not present

## 2021-05-14 DIAGNOSIS — Z79899 Other long term (current) drug therapy: Secondary | ICD-10-CM | POA: Insufficient documentation

## 2021-05-14 DIAGNOSIS — I129 Hypertensive chronic kidney disease with stage 1 through stage 4 chronic kidney disease, or unspecified chronic kidney disease: Secondary | ICD-10-CM | POA: Insufficient documentation

## 2021-05-14 DIAGNOSIS — N189 Chronic kidney disease, unspecified: Secondary | ICD-10-CM | POA: Insufficient documentation

## 2021-05-14 DIAGNOSIS — D638 Anemia in other chronic diseases classified elsewhere: Secondary | ICD-10-CM | POA: Insufficient documentation

## 2021-05-14 DIAGNOSIS — Z794 Long term (current) use of insulin: Secondary | ICD-10-CM | POA: Insufficient documentation

## 2021-05-14 DIAGNOSIS — E1122 Type 2 diabetes mellitus with diabetic chronic kidney disease: Secondary | ICD-10-CM | POA: Insufficient documentation

## 2021-05-14 DIAGNOSIS — Z20822 Contact with and (suspected) exposure to covid-19: Secondary | ICD-10-CM | POA: Diagnosis not present

## 2021-05-14 DIAGNOSIS — E559 Vitamin D deficiency, unspecified: Secondary | ICD-10-CM | POA: Insufficient documentation

## 2021-05-14 DIAGNOSIS — Z7984 Long term (current) use of oral hypoglycemic drugs: Secondary | ICD-10-CM | POA: Insufficient documentation

## 2021-05-14 DIAGNOSIS — E119 Type 2 diabetes mellitus without complications: Secondary | ICD-10-CM

## 2021-05-14 DIAGNOSIS — I10 Essential (primary) hypertension: Secondary | ICD-10-CM | POA: Diagnosis present

## 2021-05-14 DIAGNOSIS — Z7901 Long term (current) use of anticoagulants: Secondary | ICD-10-CM | POA: Insufficient documentation

## 2021-05-14 DIAGNOSIS — I5032 Chronic diastolic (congestive) heart failure: Secondary | ICD-10-CM | POA: Insufficient documentation

## 2021-05-14 DIAGNOSIS — E875 Hyperkalemia: Secondary | ICD-10-CM | POA: Insufficient documentation

## 2021-05-14 DIAGNOSIS — E118 Type 2 diabetes mellitus with unspecified complications: Secondary | ICD-10-CM

## 2021-05-14 DIAGNOSIS — N170 Acute kidney failure with tubular necrosis: Secondary | ICD-10-CM | POA: Insufficient documentation

## 2021-05-14 LAB — RENAL FUNCTION PANEL
Albumin: 4 g/dL (ref 3.5–5.0)
Anion gap: 5 (ref 5–15)
BUN: 32 mg/dL — ABNORMAL HIGH (ref 8–23)
CO2: 23 mmol/L (ref 22–32)
Calcium: 9.4 mg/dL (ref 8.9–10.3)
Chloride: 106 mmol/L (ref 98–111)
Creatinine, Ser: 1.89 mg/dL — ABNORMAL HIGH (ref 0.44–1.00)
GFR, Estimated: 28 mL/min — ABNORMAL LOW (ref >60)
Glucose, Bld: 82 mg/dL (ref 70–99)
Phosphorus: 4.3 mg/dL (ref 2.5–4.6)
Potassium: 6.7 mmol/L (ref 3.5–5.1)
Sodium: 134 mmol/L — ABNORMAL LOW (ref 135–145)

## 2021-05-14 LAB — COMPREHENSIVE METABOLIC PANEL
ALT: 31 U/L (ref 0–44)
AST: 22 U/L (ref 15–41)
Albumin: 4 g/dL (ref 3.5–5.0)
Alkaline Phosphatase: 94 U/L (ref 38–126)
Anion gap: 8 (ref 5–15)
BUN: 34 mg/dL — ABNORMAL HIGH (ref 8–23)
CO2: 23 mmol/L (ref 22–32)
Calcium: 9.4 mg/dL (ref 8.9–10.3)
Chloride: 106 mmol/L (ref 98–111)
Creatinine, Ser: 1.87 mg/dL — ABNORMAL HIGH (ref 0.44–1.00)
GFR, Estimated: 28 mL/min — ABNORMAL LOW (ref >60)
Glucose, Bld: 93 mg/dL (ref 70–99)
Potassium: 6.1 mmol/L — ABNORMAL HIGH (ref 3.5–5.1)
Sodium: 137 mmol/L (ref 135–145)
Total Bilirubin: 0.4 mg/dL (ref 0.3–1.2)
Total Protein: 7.7 g/dL (ref 6.5–8.1)

## 2021-05-14 LAB — CBC WITH DIFFERENTIAL/PLATELET
Abs Immature Granulocytes: 0.01 10*3/uL (ref 0.00–0.07)
Basophils Absolute: 0 10*3/uL (ref 0.0–0.1)
Basophils Relative: 1 %
Eosinophils Absolute: 0.3 10*3/uL (ref 0.0–0.5)
Eosinophils Relative: 4 %
HCT: 36.4 % (ref 36.0–46.0)
Hemoglobin: 11.2 g/dL — ABNORMAL LOW (ref 12.0–15.0)
Immature Granulocytes: 0 %
Lymphocytes Relative: 43 %
Lymphs Abs: 3.5 10*3/uL (ref 0.7–4.0)
MCH: 27.5 pg (ref 26.0–34.0)
MCHC: 30.8 g/dL (ref 30.0–36.0)
MCV: 89.4 fL (ref 80.0–100.0)
Monocytes Absolute: 0.6 10*3/uL (ref 0.1–1.0)
Monocytes Relative: 7 %
Neutro Abs: 3.7 10*3/uL (ref 1.7–7.7)
Neutrophils Relative %: 45 %
Platelets: 235 10*3/uL (ref 150–400)
RBC: 4.07 MIL/uL (ref 3.87–5.11)
RDW: 13 % (ref 11.5–15.5)
WBC: 8.1 10*3/uL (ref 4.0–10.5)
nRBC: 0 % (ref 0.0–0.2)

## 2021-05-14 LAB — CBC
HCT: 36.6 % (ref 36.0–46.0)
Hemoglobin: 11.5 g/dL — ABNORMAL LOW (ref 12.0–15.0)
MCH: 27.8 pg (ref 26.0–34.0)
MCHC: 31.4 g/dL (ref 30.0–36.0)
MCV: 88.4 fL (ref 80.0–100.0)
Platelets: 237 10*3/uL (ref 150–400)
RBC: 4.14 MIL/uL (ref 3.87–5.11)
RDW: 13 % (ref 11.5–15.5)
WBC: 6.1 10*3/uL (ref 4.0–10.5)
nRBC: 0 % (ref 0.0–0.2)

## 2021-05-14 LAB — PROTEIN / CREATININE RATIO, URINE
Creatinine, Urine: 75.11 mg/dL
Total Protein, Urine: <6 mg/dL

## 2021-05-14 LAB — IRON AND TIBC
Iron: 87 ug/dL (ref 28–170)
Saturation Ratios: 23 % (ref 10.4–31.8)
TIBC: 376 ug/dL (ref 250–450)
UIBC: 289 ug/dL

## 2021-05-14 LAB — VITAMIN D 25 HYDROXY (VIT D DEFICIENCY, FRACTURES): Vit D, 25-Hydroxy: 26.69 ng/mL — ABNORMAL LOW (ref 30–100)

## 2021-05-14 LAB — FERRITIN: Ferritin: 45 ng/mL (ref 11–307)

## 2021-05-14 MED ORDER — ONDANSETRON HCL 4 MG/2ML IJ SOLN: 4.0000 mg | Freq: Four times a day (QID) | INTRAMUSCULAR | Status: DC | PRN

## 2021-05-14 MED ORDER — SODIUM BICARBONATE 8.4 % IV SOLN
50.0000 meq | Freq: Once | INTRAVENOUS | Status: AC
  Administered 2021-05-14: 50 meq via INTRAVENOUS

## 2021-05-14 MED ORDER — DEXTROSE 50 % IV SOLN
1.0000 | Freq: Once | INTRAVENOUS | Status: AC
  Administered 2021-05-14: 50 mL via INTRAVENOUS
  Filled 2021-05-14: qty 50

## 2021-05-14 MED ORDER — SODIUM ZIRCONIUM CYCLOSILICATE 5 G PO PACK
10.0000 g | PACK | Freq: Once | ORAL | Status: AC
  Administered 2021-05-14: 10 g via ORAL
  Filled 2021-05-14: qty 2

## 2021-05-14 MED ORDER — SODIUM CHLORIDE 0.9 % IV SOLN: INTRAVENOUS | Status: DC

## 2021-05-14 MED ORDER — GABAPENTIN 100 MG PO CAPS
100.0000 mg | ORAL_CAPSULE | Freq: Two times a day (BID) | ORAL | Status: DC
  Administered 2021-05-15: 100 mg via ORAL
  Filled 2021-05-14: qty 1

## 2021-05-14 MED ORDER — AMLODIPINE BESYLATE 5 MG PO TABS
10.0000 mg | ORAL_TABLET | Freq: Every day | ORAL | Status: DC
Start: 2021-05-15 — End: 2021-05-15

## 2021-05-14 MED ORDER — INSULIN DETEMIR 100 UNIT/ML ~~LOC~~ SOLN
45.0000 [IU] | Freq: Every day | SUBCUTANEOUS | Status: DC
  Administered 2021-05-15: 45 [IU] via SUBCUTANEOUS
  Filled 2021-05-14 (×4): qty 0.45

## 2021-05-14 MED ORDER — OXYCODONE HCL 5 MG PO TABS: 5.0000 mg | ORAL_TABLET | ORAL | Status: DC | PRN

## 2021-05-14 MED ORDER — ASPIRIN EC 81 MG PO TBEC: 81.0000 mg | DELAYED_RELEASE_TABLET | Freq: Every day | ORAL | Status: DC

## 2021-05-14 MED ORDER — INSULIN ASPART 100 UNIT/ML IJ SOLN: 0.0000 [IU] | Freq: Three times a day (TID) | INTRAMUSCULAR | Status: DC

## 2021-05-14 MED ORDER — CLONIDINE HCL 0.2 MG PO TABS
0.3000 mg | ORAL_TABLET | Freq: Three times a day (TID) | ORAL | Status: DC
  Administered 2021-05-15: 0.3 mg via ORAL
  Filled 2021-05-14: qty 1

## 2021-05-14 MED ORDER — ACETAMINOPHEN 325 MG PO TABS: 650.0000 mg | ORAL_TABLET | Freq: Four times a day (QID) | ORAL | Status: DC | PRN

## 2021-05-14 MED ORDER — APIXABAN 5 MG PO TABS
5.0000 mg | ORAL_TABLET | Freq: Two times a day (BID) | ORAL | Status: DC
  Administered 2021-05-15: 5 mg via ORAL
  Filled 2021-05-14: qty 1

## 2021-05-14 MED ORDER — ALBUTEROL SULFATE (2.5 MG/3ML) 0.083% IN NEBU
2.5000 mg | INHALATION_SOLUTION | Freq: Once | RESPIRATORY_TRACT | Status: AC
  Administered 2021-05-14: 2.5 mg via RESPIRATORY_TRACT
  Filled 2021-05-14: qty 3

## 2021-05-14 MED ORDER — ACETAMINOPHEN 650 MG RE SUPP: 650.0000 mg | Freq: Four times a day (QID) | RECTAL | Status: DC | PRN

## 2021-05-14 MED ORDER — HYDROCHLOROTHIAZIDE 25 MG PO TABS: 25.0000 mg | ORAL_TABLET | Freq: Every day | ORAL | Status: DC

## 2021-05-14 MED ORDER — INSULIN ASPART 100 UNIT/ML IV SOLN
5.0000 [IU] | Freq: Once | INTRAVENOUS | Status: AC
  Administered 2021-05-14: 5 [IU] via INTRAVENOUS

## 2021-05-14 MED ORDER — INSULIN ASPART 100 UNIT/ML IJ SOLN: 0.0000 [IU] | Freq: Every day | INTRAMUSCULAR | Status: DC

## 2021-05-14 MED ORDER — ONDANSETRON HCL 4 MG PO TABS: 4.0000 mg | ORAL_TABLET | Freq: Four times a day (QID) | ORAL | Status: DC | PRN

## 2021-05-14 MED ORDER — ROSUVASTATIN CALCIUM 5 MG PO TABS: 5.0000 mg | ORAL_TABLET | Freq: Every day | ORAL | Status: DC

## 2021-05-14 MED ORDER — CARVEDILOL 3.125 MG PO TABS: 6.2500 mg | ORAL_TABLET | Freq: Two times a day (BID) | ORAL | Status: DC

## 2021-05-14 NOTE — Telephone Encounter (Signed)
Plan of Care forms  Copied Noted Sleeved   Kathleen [name] from [facility] dropped off forms has not received a sign of care back from provider.  Call back # [phone] or fax # [phone]

## 2021-05-14 NOTE — ED Provider Notes (Addendum)
[facility] EMERGENCY DEPARTMENT Provider Note   CSN: [id number] Arrival date & time: 05/14/21  1646     History  Chief Complaint  Patient presents with   high potassium    Kathleen [name] is a 71 y.o. female.  Patient has a history of diabetes hypertension and renal disease.  She had her potassium checked today and it was elevated  The history is provided by the patient and medical records. No language interpreter was used.  Weakness Severity:  Moderate Onset quality:  Sudden Timing:  Constant Progression:  Worsening Chronicity:  New Context: not alcohol use   Relieved by:  Nothing Worsened by:  Nothing Associated symptoms: no abdominal pain, no chest pain, no cough, no diarrhea, no frequency, no headaches and no seizures       Home Medications Prior to Admission medications   Medication Sig Start Date End Date Taking? Authorizing Provider  amLODipine (NORVASC) 10 MG tablet TAKE ONE TABLET BY MOUTH ONCE DAILY FOR BLOOD PRESSURE. Patient taking differently: Take 10 mg by mouth daily. 11/17/20  Yes Fayrene Helper, MD  aspirin EC 81 MG tablet Take 81 mg by mouth daily. Swallow whole.   Yes [provider]  carvedilol (COREG) 6.25 MG tablet Take 1 tablet (6.25 mg total) by mouth 2 (two) times daily with a meal. 01/26/21  Yes Rizwan, Eunice Blase, MD  cholecalciferol (VITAMIN D) 1000 UNITS tablet Take 1,000 Units by mouth daily.   Yes [provider]  cloNIDine (CATAPRES) 0.3 MG tablet Take 1 tablet (0.3 mg total) by mouth 3 (three) times daily. 03/26/21  Yes Fayrene Helper, MD  ELIQUIS 5 MG TABS tablet TAKE (1) TABLET BY MOUTH TWICE DAILY. Patient taking differently: Take 5 mg by mouth 2 (two) times daily. 04/10/21  Yes Fayrene Helper, MD  gabapentin (NEURONTIN) 100 MG capsule TAKE ONE CAPSULE BY MOUTH TWICE DAILY. 05/11/21  Yes Fayrene Helper, MD  glipiZIDE (GLIPIZIDE XL) 2.5 MG 24 hr tablet Take 1 tablet (2.5 mg total) by mouth daily with  breakfast. 03/26/21  Yes Fayrene Helper, MD  hydrochlorothiazide (HYDRODIURIL) 25 MG tablet Take 1 tablet (25 mg total) by mouth daily. 03/31/21  Yes Fayrene Helper, MD  LANTUS SOLOSTAR 100 UNIT/ML Solostar Pen 60 Units daily. 05/06/21  Yes [provider]  lisinopril (ZESTRIL) 20 MG tablet Take 20 mg by mouth daily. 02/20/21  Yes [provider]  olmesartan (BENICAR) 20 MG tablet Take 1 tablet (20 mg total) by mouth daily. 03/26/21  Yes Fayrene Helper, MD  rosuvastatin (CRESTOR) 5 MG tablet Take 1 tablet (5 mg total) by mouth daily. 03/26/21  Yes Fayrene Helper, MD  glucose blood (ACCU-CHEK GUIDE) test strip USE TO TEST BLOOD SUGAR THREE TIMES DAILY AS DIRECTED      DX E11.65 11/19/20   Fayrene Helper, MD  sodium polystyrene (KAYEXALATE) 15 GM/60ML suspension Take 240 cc by mouth today and repeat om 04/01/2021 Patient not taking: Reported on 05/14/2021 03/31/21   Fayrene Helper, MD  sitaGLIPtan (JANUVIA) 100 MG tablet Take 100 mg by mouth daily.    07/22/11  [provider]      Allergies    Patient has no known allergies.    Review of Systems   Review of Systems  Constitutional:  Negative for appetite change and fatigue.  HENT:  Negative for congestion, ear discharge and sinus pressure.   Eyes:  Negative for discharge.  Respiratory:  Negative for cough.  Cardiovascular:  Negative for chest pain.  Gastrointestinal:  Negative for abdominal pain and diarrhea.  Genitourinary:  Negative for frequency and hematuria.  Musculoskeletal:  Negative for back pain.  Skin:  Negative for rash.  Neurological:  Positive for weakness. Negative for seizures and headaches.  Psychiatric/Behavioral:  Negative for hallucinations.    Physical Exam Updated Vital Signs BP (!) 148/81    Pulse 63    Temp 98 F (36.7 C) (Oral)    Resp 16    Ht 5\' 6"  (1.676 m)    Wt 98 kg    SpO2 99%    BMI 34.86 kg/m  Physical Exam Vitals and nursing note reviewed.   Constitutional:      Appearance: She is well-developed.  HENT:     Head: Normocephalic.  Eyes:     General: No scleral icterus.    Conjunctiva/sclera: Conjunctivae normal.  Neck:     Thyroid: No thyromegaly.  Cardiovascular:     Rate and Rhythm: Normal rate and regular rhythm.     Heart sounds: No murmur heard.   No friction rub. No gallop.  Pulmonary:     Breath sounds: No stridor. No wheezing or rales.  Chest:     Chest wall: No tenderness.  Abdominal:     General: There is no distension.     Tenderness: There is no abdominal tenderness. There is no rebound.  Musculoskeletal:        General: Normal range of motion.     Cervical back: Neck supple.  Lymphadenopathy:     Cervical: No cervical adenopathy.  Skin:    Findings: No erythema or rash.  Neurological:     Mental Status: She is alert and oriented to person, place, and time.     Motor: No abnormal muscle tone.     Coordination: Coordination normal.  Psychiatric:        Behavior: Behavior normal.    ED Results / Procedures / Treatments   Labs (all labs ordered are listed, but only abnormal results are displayed) Labs Reviewed  COMPREHENSIVE METABOLIC PANEL - Abnormal; Notable for the following components:      Result Value   Potassium 6.1 (*)    BUN 34 (*)    Creatinine, Ser 1.87 (*)    GFR, Estimated 28 (*)    All other components within normal limits  CBC WITH DIFFERENTIAL/PLATELET - Abnormal; Notable for the following components:   Hemoglobin 11.2 (*)    All other components within normal limits    EKG EKG Interpretation  Date/Time:  Thursday May 14 2021 21:17:12 EST Ventricular Rate:  63 PR Interval:  148 QRS Duration: 81 QT Interval:  414 QTC Calculation: 424 R Axis:   72 Text Interpretation: Sinus rhythm Low voltage, precordial leads Baseline wander in lead(s) V5 Confirmed by Milton Ferguson [phone]) on 05/14/2021 9:36:00 PM  Radiology No results found.  Procedures Procedures     Medications Ordered in ED Medications  sodium zirconium cyclosilicate (LOKELMA) packet 10 g (has no administration in time range)  insulin aspart (novoLOG) injection 5 Units (has no administration in time range)    And  dextrose 50 % solution 50 mL (has no administration in time range)  sodium bicarbonate injection 50 mEq (has no administration in time range)    ED Course/ Medical Decision Making/ A&P  CRITICAL CARE Performed by: Milton Ferguson Total critical care time: 40 minutes Critical care time was exclusive of separately billable procedures and treating other patients. Critical care  was necessary to treat or prevent imminent or life-threatening deterioration. Critical care was time spent personally by me on the following activities: development of treatment plan with patient and/or surrogate as well as nursing, discussions with consultants, evaluation of patient's response to treatment, examination of patient, obtaining history from patient or surrogate, ordering and performing treatments and interventions, ordering and review of laboratory studies, ordering and review of radiographic studies, pulse oximetry and re-evaluation of patient's condition.                          Medical Decision Making  Patient with elevated potassium.  She was treated in the emergency department and will be admitted to medicine.  This patient presents to the ED for concern of weakness and elevated potassium, this involves an extensive number of treatment options, and is a complaint that carries with it a high risk of complications and morbidity.  The differential diagnosis includes renal failure   Co morbidities that complicate the patient evaluation  Diabetes hypertension   Additional history obtained:  Additional history obtained from old records External records from outside source obtained and reviewed including hospital records   Lab Tests:  I Ordered, and personally interpreted  labs.  The pertinent results include: Potassium that was elevated at 6.1   Imaging Studies ordered:  No imaging Cardiac Monitoring:  The patient was maintained on a cardiac monitor.  I personally viewed and interpreted the cardiac monitored which showed an underlying rhythm of: Normal sinus rhythm   Medicines ordered and prescription drug management:  I ordered medication including lokalma, bicarb IV, insulin and glucose Reevaluation of the patient after these medicines showed that the patient improved I have reviewed the patients home medicines and have made adjustments as needed   Test Considered:  None   Critical Interventions:  Treating the hypokalemia with lokalma, bicarb IV insulin glucose   Consultations Obtained:  I requested consultation with the hospitalist,  and discussed lab and imaging findings as well as pertinent plan - they recommend: Admission hyperkalemia   Problem List / ED Course:  Hyperkalemia   Reevaluation:  After the interventions noted above, I reevaluated the patient and found that they have :improved   Social Determinants of Health:  None   Dispostion:  After consideration of the diagnostic results and the patients response to treatment, I feel that the patent would benefit from admission.        Final Clinical Impression(s) / ED Diagnoses Final diagnoses:  Hyperkalemia    Rx / DC Orders ED Discharge Orders     None         Milton Ferguson, MD 05/14/21 2216    Milton Ferguson, MD 05/15/21 1137

## 2021-05-14 NOTE — ED Triage Notes (Signed)
Referred for elevated potassium

## 2021-05-14 NOTE — ED Provider Notes (Incomplete Revision)
[facility] EMERGENCY DEPARTMENT Provider Note   CSN: [id number] Arrival date & time: 05/14/21  1646     History  Chief Complaint  Patient presents with   high potassium    Kathleen [name] is a 71 y.o. female.  Patient has a history of diabetes hypertension and renal disease.  She had her potassium checked today and it was elevated   Weakness     Home Medications Prior to Admission medications   Medication Sig Start Date End Date Taking? Authorizing Provider  amLODipine (NORVASC) 10 MG tablet TAKE ONE TABLET BY MOUTH ONCE DAILY FOR BLOOD PRESSURE. Patient taking differently: Take 10 mg by mouth daily. 11/17/20  Yes Fayrene Helper, MD  aspirin EC 81 MG tablet Take 81 mg by mouth daily. Swallow whole.   Yes [provider]  carvedilol (COREG) 6.25 MG tablet Take 1 tablet (6.25 mg total) by mouth 2 (two) times daily with a meal. 01/26/21  Yes Rizwan, Eunice Blase, MD  cholecalciferol (VITAMIN D) 1000 UNITS tablet Take 1,000 Units by mouth daily.   Yes [provider]  cloNIDine (CATAPRES) 0.3 MG tablet Take 1 tablet (0.3 mg total) by mouth 3 (three) times daily. 03/26/21  Yes Fayrene Helper, MD  ELIQUIS 5 MG TABS tablet TAKE (1) TABLET BY MOUTH TWICE DAILY. Patient taking differently: Take 5 mg by mouth 2 (two) times daily. 04/10/21  Yes Fayrene Helper, MD  gabapentin (NEURONTIN) 100 MG capsule TAKE ONE CAPSULE BY MOUTH TWICE DAILY. 05/11/21  Yes Fayrene Helper, MD  glipiZIDE (GLIPIZIDE XL) 2.5 MG 24 hr tablet Take 1 tablet (2.5 mg total) by mouth daily with breakfast. 03/26/21  Yes Fayrene Helper, MD  hydrochlorothiazide (HYDRODIURIL) 25 MG tablet Take 1 tablet (25 mg total) by mouth daily. 03/31/21  Yes Fayrene Helper, MD  LANTUS SOLOSTAR 100 UNIT/ML Solostar Pen 60 Units daily. 05/06/21  Yes [provider]  lisinopril (ZESTRIL) 20 MG tablet Take 20 mg by mouth daily. 02/20/21  Yes [provider]  olmesartan (BENICAR) 20 MG  tablet Take 1 tablet (20 mg total) by mouth daily. 03/26/21  Yes Fayrene Helper, MD  rosuvastatin (CRESTOR) 5 MG tablet Take 1 tablet (5 mg total) by mouth daily. 03/26/21  Yes Fayrene Helper, MD  glucose blood (ACCU-CHEK GUIDE) test strip USE TO TEST BLOOD SUGAR THREE TIMES DAILY AS DIRECTED      DX E11.65 11/19/20   Fayrene Helper, MD  sodium polystyrene (KAYEXALATE) 15 GM/60ML suspension Take 240 cc by mouth today and repeat om 04/01/2021 Patient not taking: Reported on 05/14/2021 03/31/21   Fayrene Helper, MD  sitaGLIPtan (JANUVIA) 100 MG tablet Take 100 mg by mouth daily.    07/22/11  [provider]      Allergies    Patient has no known allergies.    Review of Systems   Review of Systems  Neurological:  Positive for weakness.   Physical Exam Updated Vital Signs BP (!) 148/81    Pulse 63    Temp 98 F (36.7 C) (Oral)    Resp 16    Ht 5\' 6"  (1.676 m)    Wt 98 kg    SpO2 99%    BMI 34.86 kg/m  Physical Exam  ED Results / Procedures / Treatments   Labs (all labs ordered are listed, but only abnormal results are displayed) Labs Reviewed  COMPREHENSIVE METABOLIC PANEL - Abnormal; Notable for the following components:  Result Value   Potassium 6.1 (*)    BUN 34 (*)    Creatinine, Ser 1.87 (*)    GFR, Estimated 28 (*)    All other components within normal limits  CBC WITH DIFFERENTIAL/PLATELET - Abnormal; Notable for the following components:   Hemoglobin 11.2 (*)    All other components within normal limits    EKG EKG Interpretation  Date/Time:  Thursday May 14 2021 21:17:12 EST Ventricular Rate:  63 PR Interval:  148 QRS Duration: 81 QT Interval:  414 QTC Calculation: 424 R Axis:   72 Text Interpretation: Sinus rhythm Low voltage, precordial leads Baseline wander in lead(s) V5 Confirmed by Milton Ferguson [phone]) on 05/14/2021 9:36:00 PM  Radiology No results found.  Procedures Procedures    Medications Ordered in ED Medications   sodium zirconium cyclosilicate (LOKELMA) packet 10 g (has no administration in time range)  insulin aspart (novoLOG) injection 5 Units (has no administration in time range)    And  dextrose 50 % solution 50 mL (has no administration in time range)  sodium bicarbonate injection 50 mEq (has no administration in time range)    ED Course/ Medical Decision Making/ A&P  CRITICAL CARE Performed by: Milton Ferguson Total critical care time: 40 minutes Critical care time was exclusive of separately billable procedures and treating other patients. Critical care was necessary to treat or prevent imminent or life-threatening deterioration. Critical care was time spent personally by me on the following activities: development of treatment plan with patient and/or surrogate as well as nursing, discussions with consultants, evaluation of patient's response to treatment, examination of patient, obtaining history from patient or surrogate, ordering and performing treatments and interventions, ordering and review of laboratory studies, ordering and review of radiographic studies, pulse oximetry and re-evaluation of patient's condition.                          Medical Decision Making  Patient with elevated potassium.  She was treated in the emergency department and will be admitted to medicine.        Final Clinical Impression(s) / ED Diagnoses Final diagnoses:  Hyperkalemia    Rx / DC Orders ED Discharge Orders     None         Milton Ferguson, MD 05/14/21 2216

## 2021-05-15 DIAGNOSIS — E1169 Type 2 diabetes mellitus with other specified complication: Secondary | ICD-10-CM

## 2021-05-15 DIAGNOSIS — I10 Essential (primary) hypertension: Secondary | ICD-10-CM

## 2021-05-15 DIAGNOSIS — E875 Hyperkalemia: Secondary | ICD-10-CM

## 2021-05-15 LAB — COMPREHENSIVE METABOLIC PANEL
ALT: 31 U/L (ref 0–44)
AST: 24 U/L (ref 15–41)
Albumin: 3.7 g/dL (ref 3.5–5.0)
Alkaline Phosphatase: 86 U/L (ref 38–126)
Anion gap: 6 (ref 5–15)
BUN: 31 mg/dL — ABNORMAL HIGH (ref 8–23)
CO2: 24 mmol/L (ref 22–32)
Calcium: 8.8 mg/dL — ABNORMAL LOW (ref 8.9–10.3)
Chloride: 105 mmol/L (ref 98–111)
Creatinine, Ser: 1.76 mg/dL — ABNORMAL HIGH (ref 0.44–1.00)
GFR, Estimated: 30 mL/min — ABNORMAL LOW (ref >60)
Glucose, Bld: 123 mg/dL — ABNORMAL HIGH (ref 70–99)
Potassium: 4.9 mmol/L (ref 3.5–5.1)
Sodium: 135 mmol/L (ref 135–145)
Total Bilirubin: 0.4 mg/dL (ref 0.3–1.2)
Total Protein: 7.1 g/dL (ref 6.5–8.1)

## 2021-05-15 LAB — NA AND K (SODIUM & POTASSIUM), RAND UR
Potassium Urine: 32 mmol/L
Sodium, Ur: 135 mmol/L

## 2021-05-15 LAB — CBC WITH DIFFERENTIAL/PLATELET
Abs Immature Granulocytes: 0.02 10*3/uL (ref 0.00–0.07)
Basophils Absolute: 0 10*3/uL (ref 0.0–0.1)
Basophils Relative: 1 %
Eosinophils Absolute: 0.2 10*3/uL (ref 0.0–0.5)
Eosinophils Relative: 3 %
HCT: 33.7 % — ABNORMAL LOW (ref 36.0–46.0)
Hemoglobin: 10.6 g/dL — ABNORMAL LOW (ref 12.0–15.0)
Immature Granulocytes: 0 %
Lymphocytes Relative: 42 %
Lymphs Abs: 3.5 10*3/uL (ref 0.7–4.0)
MCH: 27.7 pg (ref 26.0–34.0)
MCHC: 31.5 g/dL (ref 30.0–36.0)
MCV: 88 fL (ref 80.0–100.0)
Monocytes Absolute: 0.5 10*3/uL (ref 0.1–1.0)
Monocytes Relative: 6 %
Neutro Abs: 4.2 10*3/uL (ref 1.7–7.7)
Neutrophils Relative %: 48 %
Platelets: 235 10*3/uL (ref 150–400)
RBC: 3.83 MIL/uL — ABNORMAL LOW (ref 3.87–5.11)
RDW: 13.1 % (ref 11.5–15.5)
WBC: 8.5 10*3/uL (ref 4.0–10.5)
nRBC: 0 % (ref 0.0–0.2)

## 2021-05-15 LAB — CBG MONITORING, ED
Glucose-Capillary: 85 mg/dL (ref 70–99)
Glucose-Capillary: 85 mg/dL (ref 70–99)

## 2021-05-15 LAB — RESP PANEL BY RT-PCR (FLU A&B, COVID) ARPGX2
Influenza A by PCR: NEGATIVE
Influenza B by PCR: NEGATIVE
SARS Coronavirus 2 by RT PCR: NEGATIVE

## 2021-05-15 LAB — PTH, INTACT AND CALCIUM
Calcium, Total (PTH): 10 mg/dL (ref 8.7–10.3)
PTH: 29 pg/mL (ref 15–65)

## 2021-05-15 LAB — HEMOGLOBIN A1C
Hgb A1c MFr Bld: 7.6 % — ABNORMAL HIGH (ref 4.8–5.6)
Mean Plasma Glucose: 171.42 mg/dL

## 2021-05-15 LAB — MAGNESIUM: Magnesium: 1.4 mg/dL — ABNORMAL LOW (ref 1.7–2.4)

## 2021-05-15 MED ORDER — MAGNESIUM OXIDE -MG SUPPLEMENT 400 (240 MG) MG PO TABS: 400.0000 mg | ORAL_TABLET | Freq: Once | ORAL | Status: DC

## 2021-05-15 MED ORDER — LANTUS SOLOSTAR 100 UNIT/ML ~~LOC~~ SOPN: 45.0000 [IU] | PEN_INJECTOR | Freq: Every day | SUBCUTANEOUS | Status: DC

## 2021-05-15 NOTE — ED Notes (Signed)
IV dc'ed pt educated on follow up care and dc to front lobby via wheelchair for son to take her home.

## 2021-05-15 NOTE — ED Notes (Signed)
Pt cbg 85. Levemir due. Will adm levemir after pt eats a snack bag. Called AC to bring snack bag, currently zero in ED

## 2021-05-15 NOTE — Progress Notes (Signed)
°  Transition of Care [facility]) Screening Note   Patient Details  Name: Judine Arciniega Kibble Date of Birth: 1950/09/02   [facility]) CM/SW Contact:    Boneta Lucks, RN Phone Number: 05/15/2021, 9:12 AM    Transition of Care Department [facility]) has reviewed patient and no TOC needs have been identified at this time. We will continue to monitor patient advancement through interdisciplinary progression rounds. If new patient transition needs arise, please place a TOC consult.

## 2021-05-15 NOTE — Discharge Instructions (Addendum)
PLEASE FOLLOW UP WITH REPEAT LABS IN 1 WEEK Please take over the counter magnesium tablets twice daily for 1 week    IMPORTANT INFORMATION: PAY CLOSE ATTENTION   PHYSICIAN DISCHARGE INSTRUCTIONS  Follow with Primary care provider  Fayrene Helper, MD  and other consultants as instructed by your Hospitalist Physician  [place] IF SYMPTOMS COME BACK, WORSEN OR NEW PROBLEM DEVELOPS   Please note: You were cared for by a hospitalist during your hospital stay. Every effort will be made to forward records to your primary care provider.  You can request that your primary care provider send for your hospital records if they have not received them.  Once you are discharged, your primary care physician will handle any further medical issues. Please note that NO REFILLS for any discharge medications will be authorized once you are discharged, as it is imperative that you return to your primary care physician (or establish a relationship with a primary care physician if you do not have one) for your post hospital discharge needs so that they can reassess your need for medications and monitor your lab values.  Please get a complete blood count and chemistry panel checked by your Primary MD at your next visit, and again as instructed by your Primary MD.  Get Medicines reviewed and adjusted: Please take all your medications with you for your next visit with your Primary MD  Laboratory/radiological data: Please request your Primary MD to go over all hospital tests and procedure/radiological results at the follow up, please ask your primary care provider to get all Hospital records sent to his/her office.  In some cases, they will be blood work, cultures and biopsy results pending at the time of your discharge. Please request that your primary care provider follow up on these results.  If you are diabetic, please bring your blood sugar readings with you to your follow  up appointment with primary care.    Please call and make your follow up appointments as soon as possible.    Also Note the following: If you experience worsening of your admission symptoms, develop shortness of breath, life threatening emergency, suicidal or homicidal thoughts you must seek medical attention immediately by calling 911 or calling your MD immediately  if symptoms less severe.  You must read complete instructions/literature along with all the possible adverse reactions/side effects for all the Medicines you take and that have been prescribed to you. Take any new Medicines after you have completely understood and accpet all the possible adverse reactions/side effects.   Do not drive when taking Pain medications or sleeping medications (Benzodiazepines)  Do not take more than prescribed Pain, Sleep and Anxiety Medications. It is not advisable to combine anxiety,sleep and pain medications without talking with your primary care practitioner  Special Instructions: If you have smoked or chewed Tobacco  in the last 2 yrs please stop smoking, stop any regular Alcohol  and or any Recreational drug use.  Wear Seat belts while driving.  Do not drive if taking any narcotic, mind altering or controlled substances or recreational drugs or alcohol.

## 2021-05-15 NOTE — H&P (Signed)
TRH H&P    Patient Demographics:    Kathleen [name], is a 71 y.o. female  MRN: [id number]  DOB - 30-Nov-1950  Admit Date - 05/14/2021  Referring MD/NP/PA: Roderic Palau  Outpatient Primary MD for the patient is Fayrene Helper, MD  Patient coming from: Home  Chief complaint- Abnormal labs   HPI:    Kathleen [name]  is a 71 y.o. female, with history of type 2 diabetes mellitus, CKD, GERD, hyperlipidemia, hypertension, thyroid nodule, and more presents to ED with chief complaint of labs.  Patient reports that she is scheduled to see her nephrologist tomorrow for routine appointment.  She went in for blood work today for that routine appointment, and they called her in the afternoon and told her she had to come to the ER due to hyperkalemia.  Patient denies any muscle cramps, muscle spasms, palpitations, chest pain.  She reports that she has been feeling in her totally normal state of health.  Patient was disappointed to find out that she had to stay in the hospital. She had no other complaints at this time.  Patient's baseline creatinine is 1.77, and she sometimes runs a little high with her potassium.  In the past its been 5.9, 5.2.  Today on her morning labs her potassium was 6.7.  Repeat in the ER was 6.1.  After temporizing measures and Kayexalate it is down to 4.9.  Does not smoke, does not drink alcohol, does not use illicit drugs.  She is vaccinated for COVID.  Patient is full code.  In the ED Patient is afebrile, normal heart rate, normal respiratory rate, normotensive, satting at 99% No leukocytosis, hemoglobin 11.2 Slight bump in creatinine to 1.87 from 1.77 Potassium up to 6.1 EKG without peaked T waves Patient was given sodium bicarb, D50, NovoLog, low, Admission requested due to hyperkalemia   Review of systems:    In addition to the HPI above,  No Fever-chills, No Headache, No changes with Vision or  hearing, No problems swallowing food or Liquids, No Chest pain, Cough or Shortness of Breath, No Abdominal pain, No Nausea or Vomiting, bowel movements are regular, No Blood in stool or Urine, No dysuria, No new skin rashes or bruises, No new joints pains-aches,  No new weakness, tingling, numbness in any extremity, No recent weight gain or loss, No polyuria, polydypsia or polyphagia, No significant Mental Stressors.  All other systems reviewed and are negative.    Past History of the following :    Past Medical History:  Diagnosis Date   Acute respiratory failure with hypoxia ([place]) 10/22/2019   Anemia    Arthritis    Chronic kidney disease    Diabetes mellitus type II    Dysphagia    unspecified    GERD (gastroesophageal reflux disease)    Hyperlipidemia 2000   Hypertension 1995   Insomnia    Low back pain    Obesity    PE (pulmonary thromboembolism) ([place])    Sleep apnea in adult 11/25/2019   Thyroid nodule 11/25/2019  Past Surgical History:  Procedure Laterality Date   Carpal tunnel release     left    CHOLECYSTECTOMY  2007   DIGIT NAIL REMOVAL  08/2011   KNEE SURGERY  1.20.2012   arthroscopy LEFT knee partial medial meniscectomy   TENOTOMY     2,3,4  left foot    toenail removal     TUBAL LIGATION        Social History:      Social History   Tobacco Use   Smoking status: Never   Smokeless tobacco: Never  Substance Use Topics   Alcohol use: No       Family History :     Family History  Problem Relation Age of Onset   Diabetes Mother    Hypertension Mother        MI   Heart disease Mother    Kidney disease Mother 25       dialysis   Diabetes Father    Diabetes Sister    Dementia Sister    Diabetes Brother    Diabetes Brother    Kidney disease Brother       Home Medications:   Prior to Admission medications   Medication Sig Start Date End Date Taking? Authorizing Provider  amLODipine (NORVASC) 10 MG tablet TAKE ONE TABLET  BY MOUTH ONCE DAILY FOR BLOOD PRESSURE. Patient taking differently: Take 10 mg by mouth daily. 11/17/20  Yes Fayrene Helper, MD  aspirin EC 81 MG tablet Take 81 mg by mouth daily. Swallow whole.   Yes [provider]  carvedilol (COREG) 6.25 MG tablet Take 1 tablet (6.25 mg total) by mouth 2 (two) times daily with a meal. 01/26/21  Yes Rizwan, Eunice Blase, MD  cholecalciferol (VITAMIN D) 1000 UNITS tablet Take 1,000 Units by mouth daily.   Yes [provider]  cloNIDine (CATAPRES) 0.3 MG tablet Take 1 tablet (0.3 mg total) by mouth 3 (three) times daily. 03/26/21  Yes Fayrene Helper, MD  ELIQUIS 5 MG TABS tablet TAKE (1) TABLET BY MOUTH TWICE DAILY. Patient taking differently: Take 5 mg by mouth 2 (two) times daily. 04/10/21  Yes Fayrene Helper, MD  gabapentin (NEURONTIN) 100 MG capsule TAKE ONE CAPSULE BY MOUTH TWICE DAILY. 05/11/21  Yes Fayrene Helper, MD  glipiZIDE (GLIPIZIDE XL) 2.5 MG 24 hr tablet Take 1 tablet (2.5 mg total) by mouth daily with breakfast. 03/26/21  Yes Fayrene Helper, MD  hydrochlorothiazide (HYDRODIURIL) 25 MG tablet Take 1 tablet (25 mg total) by mouth daily. 03/31/21  Yes Fayrene Helper, MD  LANTUS SOLOSTAR 100 UNIT/ML Solostar Pen 60 Units daily. 05/06/21  Yes [provider]  lisinopril (ZESTRIL) 20 MG tablet Take 20 mg by mouth daily. 02/20/21  Yes [provider]  olmesartan (BENICAR) 20 MG tablet Take 1 tablet (20 mg total) by mouth daily. 03/26/21  Yes Fayrene Helper, MD  rosuvastatin (CRESTOR) 5 MG tablet Take 1 tablet (5 mg total) by mouth daily. 03/26/21  Yes Fayrene Helper, MD  glucose blood (ACCU-CHEK GUIDE) test strip USE TO TEST BLOOD SUGAR THREE TIMES DAILY AS DIRECTED      DX E11.65 11/19/20   Fayrene Helper, MD  sodium polystyrene (KAYEXALATE) 15 GM/60ML suspension Take 240 cc by mouth today and repeat om 04/01/2021 Patient not taking: Reported on 05/14/2021 03/31/21   Fayrene Helper, MD   sitaGLIPtan (JANUVIA) 100 MG tablet Take 100 mg by mouth daily.    07/22/11  [provider]     Allergies:    No Known Allergies   Physical Exam:   Vitals  Blood pressure 114/68, pulse 69, temperature 98 F (36.7 C), temperature source Oral, resp. rate 16, height 5\' 6"  (1.676 m), weight 98 kg, SpO2 99 %.   1.  General: Patient lying supine in bed,  no acute distress   2. Psychiatric: Alert and oriented x 3, mood and behavior normal for situation, pleasant and cooperative with exam   3. Neurologic: Speech and language are normal, face is symmetric, moves all 4 extremities voluntarily, at baseline without acute deficits on limited exam   4. HEENMT:  Head is atraumatic, normocephalic, pupils reactive to light, neck is supple, trachea is midline, mucous membranes are moist   5. Respiratory : Lungs are clear to auscultation bilaterally without wheezing, rhonchi, rales, no cyanosis, no increase in work of breathing or accessory muscle use   6. Cardiovascular : Heart rate normal, rhythm is regular, no murmurs, rubs or gallops, no peripheral edema, peripheral pulses palpated   7. Gastrointestinal:  Abdomen is soft, nondistended, nontender to palpation bowel sounds active, no masses or organomegaly palpated   8. Skin:  Skin is warm, dry and intact without rashes, acute lesions, or ulcers on limited exam   9.Musculoskeletal:  No acute deformities or trauma, no asymmetry in tone, no peripheral edema, peripheral pulses palpated, no tenderness to palpation in the extremities     Data Review:    CBC Recent Labs  Lab 05/14/21 1217 05/14/21 2127 05/15/21 0225  WBC 6.1 8.1 8.5  HGB 11.5* 11.2* 10.6*  HCT 36.6 36.4 33.7*  PLT 237 235 235  MCV 88.4 89.4 88.0  MCH 27.8 27.5 27.7  MCHC 31.4 30.8 31.5  RDW 13.0 13.0 13.1  LYMPHSABS  --  3.5 3.5  MONOABS  --  0.6 0.5  EOSABS  --  0.3 0.2  BASOSABS  --  0.0 0.0    ------------------------------------------------------------------------------------------------------------------  Results for orders placed or performed during the hospital encounter of 05/14/21 (from the past 48 hour(s))  Na and K (sodium & potassium), rand urine     Status: None   Collection Time: 05/14/21 12:20 PM  Result Value Ref Range   Sodium, Ur 135 mmol/L   Potassium Urine 32 mmol/L    Comment: Performed at [facility], [address] [place], [place] [address]  Comprehensive metabolic panel     Status: Abnormal   Collection Time: 05/14/21  8:27 PM  Result Value Ref Range   Sodium 137 135 - 145 mmol/L   Potassium 6.1 (H) 3.5 - 5.1 mmol/L   Chloride 106 98 - 111 mmol/L   CO2 23 22 - 32 mmol/L   Glucose, Bld 93 70 - 99 mg/dL    Comment: Glucose reference range applies only to samples taken after fasting for at least 8 hours.   BUN 34 (H) 8 - 23 mg/dL   Creatinine, Ser 1.87 (H) 0.44 - 1.00 mg/dL   Calcium 9.4 8.9 - 10.3 mg/dL   Total Protein 7.7 6.5 - 8.1 g/dL   Albumin 4.0 3.5 - 5.0 g/dL   AST 22 15 - 41 U/L   ALT 31 0 - 44 U/L   Alkaline Phosphatase 94 38 - 126 U/L   Total Bilirubin 0.4 0.3 - 1.2 mg/dL   GFR, Estimated 28 (L) >60 mL/min    Comment: (NOTE) Calculated using the CKD-EPI Creatinine Equation (2021)    Anion gap 8 5 - 15  Comment: Performed at [facility], [address], [place], [place] [address]  CBC with Differential/Platelet     Status: Abnormal   Collection Time: 05/14/21  9:27 PM  Result Value Ref Range   WBC 8.1 4.0 - 10.5 K/uL   RBC 4.07 3.87 - 5.11 MIL/uL   Hemoglobin 11.2 (L) 12.0 - 15.0 g/dL   HCT 36.4 36.0 - 46.0 %   MCV 89.4 80.0 - 100.0 fL   MCH 27.5 26.0 - 34.0 pg   MCHC 30.8 30.0 - 36.0 g/dL   RDW 13.0 11.5 - 15.5 %   Platelets 235 150 - 400 K/uL   nRBC 0.0 0.0 - 0.2 %   Neutrophils Relative % 45 %   Neutro Abs 3.7 1.7 - 7.7 K/uL   Lymphocytes Relative 43 %   Lymphs Abs 3.5 0.7 - 4.0 K/uL   Monocytes Relative 7 %    Monocytes Absolute 0.6 0.1 - 1.0 K/uL   Eosinophils Relative 4 %   Eosinophils Absolute 0.3 0.0 - 0.5 K/uL   Basophils Relative 1 %   Basophils Absolute 0.0 0.0 - 0.1 K/uL   Immature Granulocytes 0 %   Abs Immature Granulocytes 0.01 0.00 - 0.07 K/uL    Comment: Performed at [facility], [address], [place], [place] [address]  CBG monitoring, ED     Status: None   Collection Time: 05/15/21  1:10 AM  Result Value Ref Range   Glucose-Capillary 85 70 - 99 mg/dL    Comment: Glucose reference range applies only to samples taken after fasting for at least 8 hours.  Comprehensive metabolic panel     Status: Abnormal   Collection Time: 05/15/21  2:25 AM  Result Value Ref Range   Sodium 135 135 - 145 mmol/L   Potassium 4.9 3.5 - 5.1 mmol/L    Comment: DELTA CHECK NOTED   Chloride 105 98 - 111 mmol/L   CO2 24 22 - 32 mmol/L   Glucose, Bld 123 (H) 70 - 99 mg/dL    Comment: Glucose reference range applies only to samples taken after fasting for at least 8 hours.   BUN 31 (H) 8 - 23 mg/dL   Creatinine, Ser 1.76 (H) 0.44 - 1.00 mg/dL   Calcium 8.8 (L) 8.9 - 10.3 mg/dL   Total Protein 7.1 6.5 - 8.1 g/dL   Albumin 3.7 3.5 - 5.0 g/dL   AST 24 15 - 41 U/L   ALT 31 0 - 44 U/L   Alkaline Phosphatase 86 38 - 126 U/L   Total Bilirubin 0.4 0.3 - 1.2 mg/dL   GFR, Estimated 30 (L) >60 mL/min    Comment: (NOTE) Calculated using the CKD-EPI Creatinine Equation (2021)    Anion gap 6 5 - 15    Comment: Performed at [facility], [address], [place], [place] [address]  Magnesium     Status: Abnormal   Collection Time: 05/15/21  2:25 AM  Result Value Ref Range   Magnesium 1.4 (L) 1.7 - 2.4 mg/dL    Comment: Performed at [facility], [address] [place],  [address]  CBC WITH DIFFERENTIAL     Status: Abnormal   Collection Time: 05/15/21  2:25 AM  Result Value Ref Range   WBC 8.5 4.0 - 10.5 K/uL   RBC 3.83 (L) 3.87 - 5.11 MIL/uL   Hemoglobin 10.6 (L) 12.0 - 15.0 g/dL   HCT 33.7 (L)  36.0 - 46.0 %   MCV 88.0 80.0 - 100.0 fL  MCH 27.7 26.0 - 34.0 pg   MCHC 31.5 30.0 - 36.0 g/dL   RDW 13.1 11.5 - 15.5 %   Platelets 235 150 - 400 K/uL   nRBC 0.0 0.0 - 0.2 %   Neutrophils Relative % 48 %   Neutro Abs 4.2 1.7 - 7.7 K/uL   Lymphocytes Relative 42 %   Lymphs Abs 3.5 0.7 - 4.0 K/uL   Monocytes Relative 6 %   Monocytes Absolute 0.5 0.1 - 1.0 K/uL   Eosinophils Relative 3 %   Eosinophils Absolute 0.2 0.0 - 0.5 K/uL   Basophils Relative 1 %   Basophils Absolute 0.0 0.0 - 0.1 K/uL   Immature Granulocytes 0 %   Abs Immature Granulocytes 0.02 0.00 - 0.07 K/uL    Comment: Performed at [facility], [address], [place], [place] [address]    Chemistries  Recent Labs  Lab 05/14/21 1217 05/14/21 2027 05/15/21 0225  NA 134* 137 135  K 6.7* 6.1* 4.9  CL 106 106 105  CO2 23 23 24   GLUCOSE 82 93 123*  BUN 32* 34* 31*  CREATININE 1.89* 1.87* 1.76*  CALCIUM 9.4 9.4 8.8*  MG  --   --  1.4*  AST  --  22 24  ALT  --  31 31  ALKPHOS  --  94 86  BILITOT  --  0.4 0.4   ------------------------------------------------------------------------------------------------------------------  ------------------------------------------------------------------------------------------------------------------ GFR: Estimated Creatinine Clearance: 34.1 mL/min (A) (by C-G formula based on SCr of 1.76 mg/dL (H)). Liver Function Tests: Recent Labs  Lab 05/14/21 1217 05/14/21 2027 05/15/21 0225  AST  --  22 24  ALT  --  31 31  ALKPHOS  --  94 86  BILITOT  --  0.4 0.4  PROT  --  7.7 7.1  ALBUMIN 4.0 4.0 3.7   No results for input(s): LIPASE, AMYLASE in the last 168 hours. No results for input(s): AMMONIA in the last 168 hours. Coagulation Profile: No results for input(s): INR, PROTIME in the last 168 hours. Cardiac Enzymes: No results for input(s): CKTOTAL, CKMB, CKMBINDEX, TROPONINI in the last 168 hours. BNP (last 3 results) No results for input(s): PROBNP in the last 8760  hours. HbA1C: No results for input(s): HGBA1C in the last 72 hours. CBG: Recent Labs  Lab 05/15/21 0110  GLUCAP 85   Lipid Profile: No results for input(s): CHOL, HDL, LDLCALC, TRIG, CHOLHDL, LDLDIRECT in the last 72 hours. Thyroid Function Tests: No results for input(s): TSH, T4TOTAL, FREET4, T3FREE, THYROIDAB in the last 72 hours. Anemia Panel: Recent Labs    05/14/21 1217  FERRITIN 45  TIBC 376  IRON 87    --------------------------------------------------------------------------------------------------------------- Urine analysis:    Component Value Date/Time   COLORURINE YELLOW 01/13/2021 1630   APPEARANCEUR CLOUDY (A) 01/13/2021 1630   LABSPEC 1.015 01/13/2021 1630   PHURINE 6.0 01/13/2021 1630   GLUCOSEU NEGATIVE 01/13/2021 1630   HGBUR SMALL (A) 01/13/2021 1630   BILIRUBINUR NEGATIVE 01/13/2021 1630   KETONESUR NEGATIVE 01/13/2021 1630   PROTEINUR NEGATIVE 01/13/2021 1630   NITRITE NEGATIVE 01/13/2021 1630   LEUKOCYTESUR SMALL (A) 01/13/2021 1630      Imaging Results:    No results found.     Assessment & Plan:    Principal Problem:   Hyperkalemia Active Problems:   Type 2 diabetes mellitus (HCC)   Essential hypertension   Hyperkalemia Patient was given NovoLog, D50, sodium bicarb, Lokelma in the ED Albuterol added on at admission Monitor on telemetry No EKG changes  Potassium improved from 6.1, 4.9 with above treatments Asymptomatic Likely to discharge home today Type 2 diabetes mellitus Continue reduced dose of basal insulin Carb modified diet Sliding scale coverage Continue to monitor Hypertension Continue hydrochlorothiazide, lisinopril, clonidine, Coreg, Norvasc Hyperlipidemia Continue statin    DVT Prophylaxis-   Heparin - SCDs   AM Labs Ordered, also please review Full Orders  Family Communication: No family at bedside  Code Status: Full  Admission status: Observation  Disposition: Anticipated Discharge date 24 hours  discharge to home  Time spent in minutes : [place] DO

## 2021-05-15 NOTE — ED Notes (Signed)
Went into room to get patient ready for discharge.  Patient ask that I allow her to finish eating breakfast. I advised that I would check back with her in 5 minutes.

## 2021-05-15 NOTE — Discharge Summary (Signed)
Physician Discharge Summary  Kathleen [name] ONG:[id number] DOB: 03/01/1951 DOA: 05/14/2021  PCP: Fayrene Helper, MD Nephrologist: Dr. Theador Hawthorne   Admit date: 05/14/2021 Discharge date: 05/15/2021  Admitted From:  Home  Disposition: Home   Recommendations for Outpatient Follow-up:  Follow up with PCP in 2 weeks Follow up with nephrologist in 1 week  Please obtain BMP and magnesium in one week  Discharge Condition: STABLE   CODE STATUS: FULL DIET: renal / carb modified    Brief Hospitalization Summary: Please see all hospital notes, images, labs for full details of the hospitalization. ADMISSION HPI:  Kathleen [name]  is a 71 y.o. female, with history of type 2 diabetes mellitus, CKD, GERD, hyperlipidemia, hypertension, thyroid nodule, and more presents to ED with chief complaint of labs.  Patient reports that she is scheduled to see her nephrologist tomorrow for routine appointment.  She went in for blood work today for that routine appointment, and they called her in the afternoon and told her she had to come to the ER due to hyperkalemia.  Patient denies any muscle cramps, muscle spasms, palpitations, chest pain.  She reports that she has been feeling in her totally normal state of health.  Patient was disappointed to find out that she had to stay in the hospital. She had no other complaints at this time.  Patient's baseline creatinine is 1.77, and she sometimes runs a little high with her potassium.  In the past its been 5.9, 5.2.  Today on her morning labs her potassium was 6.7.  Repeat in the ER was 6.1.  After temporizing measures and Kayexalate it is down to 4.9.   Does not smoke, does not drink alcohol, does not use illicit drugs.  She is vaccinated for COVID.  Patient is full code.   In the ED Patient is afebrile, normal heart rate, normal respiratory rate, normotensive, satting at 99% No leukocytosis, hemoglobin 11.2 Slight bump in creatinine to 1.87 from 1.77 Potassium up to  6.1 EKG without peaked T waves Patient was given sodium bicarb, D50, NovoLog, low, Admission requested due to hyperkalemia  HOSPITAL COURSE Pt was admitted with hyperkalemia with a potassium of 6.1.  She was asymptomatic and treated with insulin, D50, sodium bicarb and Lokelma in the emergency department with high-dose fluids.  She fortunately responded very well and potassium now down to 4.9.  Her lisinopril and Benicar has been discontinued.  She has a nephrology appointment later today and I will ask her to follow-up.  Of asked her to have repeat labs done in about a week.  She was also noted to have a low magnesium and was given oral potassium in asked to take over-the-counter magnesium supplements for 1 week.  Diabetes mellitus hypertension has remained stable.  Her hyperlipidemia has been stable and she has been resumed on home statin therapy.  She feels well and will discharge home today.    Discharge Diagnoses:  Principal Problem:   Hyperkalemia Active Problems:   Type 2 diabetes mellitus (HCC)   Essential hypertension   Hypomagnesemia   Discharge Instructions:  Allergies as of 05/15/2021   No Known Allergies      Medication List     STOP taking these medications    lisinopril 20 MG tablet Commonly known as: ZESTRIL   olmesartan 20 MG tablet Commonly known as: BENICAR   sodium polystyrene 15 GM/60ML suspension Commonly known as: KAYEXALATE       TAKE these medications    Accu-Chek Guide test  strip Generic drug: glucose blood USE TO TEST BLOOD SUGAR THREE TIMES DAILY AS DIRECTED      DX E11.65   amLODipine 10 MG tablet Commonly known as: NORVASC TAKE ONE TABLET BY MOUTH ONCE DAILY FOR BLOOD PRESSURE. What changed:  how much to take how to take this when to take this additional instructions   aspirin EC 81 MG tablet Take 81 mg by mouth daily. Swallow whole.   carvedilol 6.25 MG tablet Commonly known as: COREG Take 1 tablet (6.25 mg total) by mouth 2  (two) times daily with a meal.   cholecalciferol 1000 units tablet Commonly known as: VITAMIN D Take 1,000 Units by mouth daily.   cloNIDine 0.3 MG tablet Commonly known as: Catapres Take 1 tablet (0.3 mg total) by mouth 3 (three) times daily.   Eliquis 5 MG Tabs tablet Generic drug: apixaban TAKE (1) TABLET BY MOUTH TWICE DAILY. What changed: See the new instructions.   gabapentin 100 MG capsule Commonly known as: NEURONTIN TAKE ONE CAPSULE BY MOUTH TWICE DAILY.   glipiZIDE 2.5 MG 24 hr tablet Commonly known as: glipiZIDE XL Take 1 tablet (2.5 mg total) by mouth daily with breakfast.   hydrochlorothiazide 25 MG tablet Commonly known as: HYDRODIURIL Take 1 tablet (25 mg total) by mouth daily.   Lantus SoloStar 100 UNIT/ML Solostar Pen Generic drug: insulin glargine Inject 45 Units into the skin daily. What changed:  how much to take how to take this   rosuvastatin 5 MG tablet Commonly known as: Crestor Take 1 tablet (5 mg total) by mouth daily.        Follow-up Information     Fayrene Helper, MD Follow up in 2 week(s).   Specialty: Family Medicine Why: Hospital Follow Up Contact information: [address] [place] [address] [phone]         Liana Gerold, MD. Schedule an appointment as soon as possible for a visit in 1 week(s).   Specialty: Nephrology Why: Hospital Follow Up Contact information: [address] [address] [phone]                No Known Allergies Allergies as of 05/15/2021   No Known Allergies      Medication List     STOP taking these medications    lisinopril 20 MG tablet Commonly known as: ZESTRIL   olmesartan 20 MG tablet Commonly known as: BENICAR   sodium polystyrene 15 GM/60ML suspension Commonly known as: KAYEXALATE       TAKE these medications    Accu-Chek Guide test strip Generic drug: glucose blood USE TO TEST BLOOD SUGAR THREE TIMES DAILY AS  DIRECTED      DX E11.65   amLODipine 10 MG tablet Commonly known as: NORVASC TAKE ONE TABLET BY MOUTH ONCE DAILY FOR BLOOD PRESSURE. What changed:  how much to take how to take this when to take this additional instructions   aspirin EC 81 MG tablet Take 81 mg by mouth daily. Swallow whole.   carvedilol 6.25 MG tablet Commonly known as: COREG Take 1 tablet (6.25 mg total) by mouth 2 (two) times daily with a meal.   cholecalciferol 1000 units tablet Commonly known as: VITAMIN D Take 1,000 Units by mouth daily.   cloNIDine 0.3 MG tablet Commonly known as: Catapres Take 1 tablet (0.3 mg total) by mouth 3 (three) times daily.   Eliquis 5 MG Tabs tablet Generic drug: apixaban TAKE (1) TABLET BY MOUTH  TWICE DAILY. What changed: See the new instructions.   gabapentin 100 MG capsule Commonly known as: NEURONTIN TAKE ONE CAPSULE BY MOUTH TWICE DAILY.   glipiZIDE 2.5 MG 24 hr tablet Commonly known as: glipiZIDE XL Take 1 tablet (2.5 mg total) by mouth daily with breakfast.   hydrochlorothiazide 25 MG tablet Commonly known as: HYDRODIURIL Take 1 tablet (25 mg total) by mouth daily.   Lantus SoloStar 100 UNIT/ML Solostar Pen Generic drug: insulin glargine Inject 45 Units into the skin daily. What changed:  how much to take how to take this   rosuvastatin 5 MG tablet Commonly known as: Crestor Take 1 tablet (5 mg total) by mouth daily.        Procedures/Studies: No results found.   Subjective: Pt reports feeling much better and wanting to go home.   Discharge Exam: Vitals:   05/15/21 0730 05/15/21 0800  BP: 123/62 119/65  Pulse: 71 70  Resp:  20  Temp:    SpO2: 95% 95%   Vitals:   05/15/21 0630 05/15/21 0700 05/15/21 0730 05/15/21 0800  BP: 120/61 113/63 123/62 119/65  Pulse: 66 (!) 56 71 70  Resp:    20  Temp:      TempSrc:      SpO2: 96% 96% 95% 95%  Weight:      Height:       General: Pt is alert, awake, not in acute distress Cardiovascular:  normal S1/S2 +, no rubs, no gallops Respiratory: CTA bilaterally, no wheezing, no rhonchi Abdominal: Soft, NT, ND, bowel sounds + Extremities: no edema, no cyanosis   The results of significant diagnostics from this hospitalization (including imaging, microbiology, ancillary and laboratory) are listed below for reference.     Microbiology: Recent Results (from the past 240 hour(s))  Resp Panel by RT-PCR (Flu A&B, Covid) Nasopharyngeal Swab     Status: None   Collection Time: 05/15/21  5:45 AM   Specimen: Nasopharyngeal Swab; Nasopharyngeal(NP) swabs in vial transport medium  Result Value Ref Range Status   SARS Coronavirus 2 by RT PCR NEGATIVE NEGATIVE Final    Comment: (NOTE) SARS-CoV-2 target nucleic acids are NOT DETECTED.  The SARS-CoV-2 RNA is generally detectable in upper respiratory specimens during the acute phase of infection. The lowest concentration of SARS-CoV-2 viral copies this assay can detect is 138 copies/mL. A negative result does not preclude SARS-Cov-2 infection and should not be used as the sole basis for treatment or other patient management decisions. A negative result may occur with  improper specimen collection/handling, submission of specimen other than nasopharyngeal swab, presence of viral mutation(s) within the areas targeted by this assay, and inadequate number of viral copies(<138 copies/mL). A negative result must be combined with clinical observations, patient history, and epidemiological information. The expected result is Negative.  Fact Sheet for Patients:  EntrepreneurPulse.com.au  Fact Sheet for Healthcare Providers:  IncredibleEmployment.be  This test is no t yet approved or cleared by the [place] FDA and  has been authorized for detection and/or diagnosis of SARS-CoV-2 by FDA under an Emergency Use Authorization (EUA). This EUA will remain  in effect (meaning this test can be used) for the  duration of the COVID-19 declaration under Section 564(b)(1) of the Act, 21 U.S.C.section 360bbb-3(b)(1), unless the authorization is terminated  or revoked sooner.       Influenza A by PCR NEGATIVE NEGATIVE Final   Influenza B by PCR NEGATIVE NEGATIVE Final    Comment: (NOTE) The Xpert Xpress SARS-CoV-2/FLU/RSV plus assay  is intended as an aid in the diagnosis of influenza from Nasopharyngeal swab specimens and should not be used as a sole basis for treatment. Nasal washings and aspirates are unacceptable for Xpert Xpress SARS-CoV-2/FLU/RSV testing.  Fact Sheet for Patients: EntrepreneurPulse.com.au  Fact Sheet for Healthcare Providers: IncredibleEmployment.be  This test is not yet approved or cleared by the [place] FDA and has been authorized for detection and/or diagnosis of SARS-CoV-2 by FDA under an Emergency Use Authorization (EUA). This EUA will remain in effect (meaning this test can be used) for the duration of the COVID-19 declaration under Section 564(b)(1) of the Act, 21 U.S.C. section 360bbb-3(b)(1), unless the authorization is terminated or revoked.  Performed at [facility], [address] [place], [place] [address]      Labs: BNP (last 3 results) Recent Labs    01/10/21 2317  BNP 37.6   Basic Metabolic Panel: Recent Labs  Lab 05/14/21 1217 05/14/21 2027 05/15/21 0225  NA 134* 137 135  K 6.7* 6.1* 4.9  CL 106 106 105  CO2 23 23 24   GLUCOSE 82 93 123*  BUN 32* 34* 31*  CREATININE 1.89* 1.87* 1.76*  CALCIUM 9.4 9.4 8.8*  MG  --   --  1.4*  PHOS 4.3  --   --    Liver Function Tests: Recent Labs  Lab 05/14/21 1217 05/14/21 2027 05/15/21 0225  AST  --  22 24  ALT  --  31 31  ALKPHOS  --  94 86  BILITOT  --  0.4 0.4  PROT  --  7.7 7.1  ALBUMIN 4.0 4.0 3.7   No results for input(s): LIPASE, AMYLASE in the last 168 hours. No results for input(s): AMMONIA in the last 168 hours. CBC: Recent Labs   Lab 05/14/21 1217 05/14/21 2127 05/15/21 0225  WBC 6.1 8.1 8.5  NEUTROABS  --  3.7 4.2  HGB 11.5* 11.2* 10.6*  HCT 36.6 36.4 33.7*  MCV 88.4 89.4 88.0  PLT 237 235 235   Cardiac Enzymes: No results for input(s): CKTOTAL, CKMB, CKMBINDEX, TROPONINI in the last 168 hours. BNP: Invalid input(s): POCBNP CBG: Recent Labs  Lab 05/15/21 0110  GLUCAP 85   D-Dimer No results for input(s): DDIMER in the last 72 hours. Hgb A1c No results for input(s): HGBA1C in the last 72 hours. Lipid Profile No results for input(s): CHOL, HDL, LDLCALC, TRIG, CHOLHDL, LDLDIRECT in the last 72 hours. Thyroid function studies No results for input(s): TSH, T4TOTAL, T3FREE, THYROIDAB in the last 72 hours.  Invalid input(s): FREET3 Anemia work up Recent Labs    05/14/21 1217  FERRITIN 45  TIBC 376  IRON 87   Urinalysis    Component Value Date/Time   COLORURINE YELLOW 01/13/2021 1630   APPEARANCEUR CLOUDY (A) 01/13/2021 1630   LABSPEC 1.015 01/13/2021 1630   PHURINE 6.0 01/13/2021 1630   GLUCOSEU NEGATIVE 01/13/2021 1630   HGBUR SMALL (A) 01/13/2021 1630   BILIRUBINUR NEGATIVE 01/13/2021 1630   KETONESUR NEGATIVE 01/13/2021 1630   PROTEINUR NEGATIVE 01/13/2021 1630   NITRITE NEGATIVE 01/13/2021 1630   LEUKOCYTESUR SMALL (A) 01/13/2021 1630   Sepsis Labs Invalid input(s): PROCALCITONIN,  WBC,  LACTICIDVEN Microbiology Recent Results (from the past 240 hour(s))  Resp Panel by RT-PCR (Flu A&B, Covid) Nasopharyngeal Swab     Status: None   Collection Time: 05/15/21  5:45 AM   Specimen: Nasopharyngeal Swab; Nasopharyngeal(NP) swabs in vial transport medium  Result Value Ref Range Status   SARS Coronavirus 2 by  RT PCR NEGATIVE NEGATIVE Final    Comment: (NOTE) SARS-CoV-2 target nucleic acids are NOT DETECTED.  The SARS-CoV-2 RNA is generally detectable in upper respiratory specimens during the acute phase of infection. The lowest concentration of SARS-CoV-2 viral copies this assay can  detect is 138 copies/mL. A negative result does not preclude SARS-Cov-2 infection and should not be used as the sole basis for treatment or other patient management decisions. A negative result may occur with  improper specimen collection/handling, submission of specimen other than nasopharyngeal swab, presence of viral mutation(s) within the areas targeted by this assay, and inadequate number of viral copies(<138 copies/mL). A negative result must be combined with clinical observations, patient history, and epidemiological information. The expected result is Negative.  Fact Sheet for Patients:  EntrepreneurPulse.com.au  Fact Sheet for Healthcare Providers:  IncredibleEmployment.be  This test is no t yet approved or cleared by the [place] FDA and  has been authorized for detection and/or diagnosis of SARS-CoV-2 by FDA under an Emergency Use Authorization (EUA). This EUA will remain  in effect (meaning this test can be used) for the duration of the COVID-19 declaration under Section 564(b)(1) of the Act, 21 U.S.C.section 360bbb-3(b)(1), unless the authorization is terminated  or revoked sooner.       Influenza A by PCR NEGATIVE NEGATIVE Final   Influenza B by PCR NEGATIVE NEGATIVE Final    Comment: (NOTE) The Xpert Xpress SARS-CoV-2/FLU/RSV plus assay is intended as an aid in the diagnosis of influenza from Nasopharyngeal swab specimens and should not be used as a sole basis for treatment. Nasal washings and aspirates are unacceptable for Xpert Xpress SARS-CoV-2/FLU/RSV testing.  Fact Sheet for Patients: EntrepreneurPulse.com.au  Fact Sheet for Healthcare Providers: IncredibleEmployment.be  This test is not yet approved or cleared by the [place] FDA and has been authorized for detection and/or diagnosis of SARS-CoV-2 by FDA under an Emergency Use Authorization (EUA). This EUA will remain in  effect (meaning this test can be used) for the duration of the COVID-19 declaration under Section 564(b)(1) of the Act, 21 U.S.C. section 360bbb-3(b)(1), unless the authorization is terminated or revoked.  Performed at [facility], [address], [place], [place] [address]     Time coordinating discharge:   SIGNED:  Irwin Brakeman, MD  [facility] 05/15/2021, 8:43 AM How to contact the [facility] Attending or Consulting provider [place] or covering provider during after hours [place], for this patient?  Check the care team in [facility] and look for a) attending/consulting [facility] provider listed and b) the [facility] team listed Log into [email or web address] and use [place]'s universal password to access. If you do not have the password, please contact the hospital operator. Locate the [facility] provider you are looking for under [facility] and page to a number that you can be directly reached. If you still have difficulty reaching the provider, please page the [facility] (Director on Call) for the Hospitalists listed on amion for assistance.

## 2021-05-18 DIAGNOSIS — E1122 Type 2 diabetes mellitus with diabetic chronic kidney disease: Secondary | ICD-10-CM | POA: Diagnosis not present

## 2021-05-18 DIAGNOSIS — R1310 Dysphagia, unspecified: Secondary | ICD-10-CM | POA: Diagnosis not present

## 2021-05-18 DIAGNOSIS — N1832 Chronic kidney disease, stage 3b: Secondary | ICD-10-CM | POA: Diagnosis not present

## 2021-05-18 DIAGNOSIS — Z6834 Body mass index (BMI) 34.0-34.9, adult: Secondary | ICD-10-CM

## 2021-05-18 DIAGNOSIS — E011 Iodine-deficiency related multinodular (endemic) goiter: Secondary | ICD-10-CM | POA: Diagnosis not present

## 2021-05-18 DIAGNOSIS — K219 Gastro-esophageal reflux disease without esophagitis: Secondary | ICD-10-CM | POA: Diagnosis not present

## 2021-05-18 DIAGNOSIS — G4700 Insomnia, unspecified: Secondary | ICD-10-CM | POA: Diagnosis not present

## 2021-05-18 DIAGNOSIS — I129 Hypertensive chronic kidney disease with stage 1 through stage 4 chronic kidney disease, or unspecified chronic kidney disease: Secondary | ICD-10-CM | POA: Diagnosis not present

## 2021-05-18 DIAGNOSIS — M1990 Unspecified osteoarthritis, unspecified site: Secondary | ICD-10-CM | POA: Diagnosis not present

## 2021-05-18 DIAGNOSIS — I2699 Other pulmonary embolism without acute cor pulmonale: Secondary | ICD-10-CM | POA: Diagnosis not present

## 2021-05-18 DIAGNOSIS — E785 Hyperlipidemia, unspecified: Secondary | ICD-10-CM | POA: Diagnosis not present

## 2021-05-18 DIAGNOSIS — E669 Obesity, unspecified: Secondary | ICD-10-CM

## 2021-05-18 LAB — PROTEIN ELECTROPHORESIS, SERUM
A/G Ratio: 1 (ref 0.7–1.7)
Albumin ELP: 3.6 g/dL (ref 2.9–4.4)
Alpha-1-Globulin: 0.2 g/dL (ref 0.0–0.4)
Alpha-2-Globulin: 0.7 g/dL (ref 0.4–1.0)
Beta Globulin: 1.2 g/dL (ref 0.7–1.3)
Gamma Globulin: 1.4 g/dL (ref 0.4–1.8)
Globulin, Total: 3.5 g/dL (ref 2.2–3.9)
M-Spike, %: 0.4 g/dL — ABNORMAL HIGH
Total Protein ELP: 7.1 g/dL (ref 6.0–8.5)

## 2021-05-21 DIAGNOSIS — E875 Hyperkalemia: Secondary | ICD-10-CM | POA: Diagnosis not present

## 2021-05-21 DIAGNOSIS — I129 Hypertensive chronic kidney disease with stage 1 through stage 4 chronic kidney disease, or unspecified chronic kidney disease: Secondary | ICD-10-CM | POA: Diagnosis not present

## 2021-05-21 DIAGNOSIS — N189 Chronic kidney disease, unspecified: Secondary | ICD-10-CM | POA: Diagnosis not present

## 2021-05-21 DIAGNOSIS — E1122 Type 2 diabetes mellitus with diabetic chronic kidney disease: Secondary | ICD-10-CM | POA: Diagnosis not present

## 2021-05-21 DIAGNOSIS — N170 Acute kidney failure with tubular necrosis: Secondary | ICD-10-CM | POA: Diagnosis not present

## 2021-05-25 DIAGNOSIS — I5032 Chronic diastolic (congestive) heart failure: Secondary | ICD-10-CM | POA: Diagnosis not present

## 2021-05-25 DIAGNOSIS — E875 Hyperkalemia: Secondary | ICD-10-CM | POA: Diagnosis not present

## 2021-05-25 DIAGNOSIS — E559 Vitamin D deficiency, unspecified: Secondary | ICD-10-CM | POA: Diagnosis not present

## 2021-05-25 DIAGNOSIS — I129 Hypertensive chronic kidney disease with stage 1 through stage 4 chronic kidney disease, or unspecified chronic kidney disease: Secondary | ICD-10-CM | POA: Diagnosis not present

## 2021-05-25 DIAGNOSIS — D638 Anemia in other chronic diseases classified elsewhere: Secondary | ICD-10-CM | POA: Diagnosis not present

## 2021-05-25 DIAGNOSIS — N189 Chronic kidney disease, unspecified: Secondary | ICD-10-CM | POA: Diagnosis not present

## 2021-05-25 DIAGNOSIS — E1122 Type 2 diabetes mellitus with diabetic chronic kidney disease: Secondary | ICD-10-CM | POA: Diagnosis not present

## 2021-05-25 NOTE — Telephone Encounter (Signed)
Called and spoke to Kathleen [name] forms are ready for pick up.

## 2021-05-26 ENCOUNTER — Other Ambulatory Visit: Payer: Self-pay | Admitting: Family Medicine

## 2021-06-03 ENCOUNTER — Ambulatory Visit: Payer: Medicare Other | Admitting: Family Medicine

## 2021-06-05 ENCOUNTER — Ambulatory Visit (INDEPENDENT_AMBULATORY_CARE_PROVIDER_SITE_OTHER): Payer: Medicare Other | Admitting: Family Medicine

## 2021-06-05 ENCOUNTER — Other Ambulatory Visit: Payer: Self-pay

## 2021-06-05 VITALS — BP 138/72 | HR 104 | Resp 16 | Ht 66.0 in | Wt 213.0 lb

## 2021-06-05 DIAGNOSIS — E1169 Type 2 diabetes mellitus with other specified complication: Secondary | ICD-10-CM | POA: Diagnosis not present

## 2021-06-05 DIAGNOSIS — I10 Essential (primary) hypertension: Secondary | ICD-10-CM

## 2021-06-05 DIAGNOSIS — E7849 Other hyperlipidemia: Secondary | ICD-10-CM | POA: Diagnosis not present

## 2021-06-05 DIAGNOSIS — I2699 Other pulmonary embolism without acute cor pulmonale: Secondary | ICD-10-CM | POA: Diagnosis not present

## 2021-06-05 DIAGNOSIS — J209 Acute bronchitis, unspecified: Secondary | ICD-10-CM | POA: Diagnosis not present

## 2021-06-05 MED ORDER — AZITHROMYCIN 250 MG PO TABS: ORAL_TABLET | ORAL | 0 refills | Status: AC

## 2021-06-05 MED ORDER — MONTELUKAST SODIUM 10 MG PO TABS: 10.0000 mg | ORAL_TABLET | Freq: Every day | ORAL | 1 refills | Status: DC

## 2021-06-05 MED ORDER — BENZONATATE 100 MG PO CAPS: 100.0000 mg | ORAL_CAPSULE | Freq: Two times a day (BID) | ORAL | 0 refills | Status: DC | PRN

## 2021-06-05 NOTE — Patient Instructions (Signed)
F/U end April re evaluate blood pressure and diabetes with foot exam, call if you need me sooner  Medication sent for acute bronchitis  Please get non fasting chem 7 and EGFr week of Feb 20,    Fasting lipid, cmp and EGfr and HBA1C 3 to 5 days befor April appt  Thanks for choosing [facility], we consider it a privelige to serve you.

## 2021-06-08 ENCOUNTER — Other Ambulatory Visit: Payer: Self-pay | Admitting: Family Medicine

## 2021-06-11 ENCOUNTER — Other Ambulatory Visit: Payer: Self-pay | Admitting: Family Medicine

## 2021-06-14 ENCOUNTER — Encounter: Payer: Self-pay | Admitting: Family Medicine

## 2021-06-14 NOTE — Progress Notes (Signed)
Kathleen [name]     MRN: [id number]      DOB: 11-26-1950   HPI Kathleen [name] is here for follow up and re-evaluation of chronic medical conditions, medication management and review of any available recent lab and radiology data.  Preventive health is updated, specifically  Cancer screening and Immunization.   Questions or concerns regarding consultations or procedures which the PT has had in the interim are  addressed. The PT denies any adverse reactions to current medications since the last visit.  5 day h/o cough , chest congestion, thick sputum and chills Denies polyuria, polydipsia, blurred vision , or hypoglycemic episodes.  ROS Denies recent fever . Denies sinus pressure, nasal congestion, ear pain or sore throat. Denies chest pains, palpitations and leg swelling Denies abdominal pain, nausea, vomiting,diarrhea or constipation.   Denies dysuria, frequency, hesitancy or incontinence. Denies joint pain, swelling and limitation in mobility. Denies headaches, seizures, numbness, or tingling. Denies depression, anxiety or insomnia. Denies skin break down or rash.   PE  BP 138/72    Pulse (!) 104    Resp 16    Ht 5\' 6"  (1.676 m)    Wt 213 lb (96.6 kg)    SpO2 93%    BMI 34.38 kg/m   Patient alert and oriented and in no cardiopulmonary distress.  HEENT: No facial asymmetry, EOMI,     Neck supple .  Chest: scattered crackles and few wehezes, adequate air entry.  CVS: S1, S2 no murmurs, no S3.Regular rate.  ABD: Soft non tender.   Ext: No edema  MS: Adequate ROM spine, shoulders, hips and knees.  Skin: Intact, no ulcerations or rash noted.  Psych: Good eye contact, normal affect. Memory intact not anxious or depressed appearing.  CNS: CN 2-12 intact, power,  normal throughout.no focal deficits noted.   Assessment & Plan  Acute bronchitis Tessalon perle, X pack and singulair prescribed  Essential hypertension DASH diet and commitment to daily physical activity for  a minimum of 30 minutes discussed and encouraged, as a part of hypertension management. The importance of attaining a healthy weight is also discussed.  BP/Weight 06/05/2021 05/15/2021 05/14/2021 03/26/2021 01/27/2021 5/57/3220 06/14/4268  Systolic BP 623 762 - 831 517 - 616  Diastolic BP 72 68 - 78 65 - 66  Wt. (Lbs) 213 - 216 213.12 - 223.77 218  BMI 34.38 - 34.86 34.4 - 36.12 35.19     Controlled, no change in medication   Acute massive pulmonary embolism (HCC) Maintained on anticoagullant  Type 2 diabetes mellitus ([place]) Kathleen [name] is reminded of the importance of commitment to daily physical activity for 30 minutes or more, as able and the need to limit carbohydrate intake to 30 to 60 grams per meal to help with blood sugar control.   The need to take medication as prescribed, test blood sugar as directed, and to call between visits if there is a concern that blood sugar is uncontrolled is also discussed.   Kathleen [name] is reminded of the importance of daily foot exam, annual eye examination, and good blood sugar, blood pressure and cholesterol control. Sub optimal, though adequate control Diabetic Labs Latest Ref Rng & Units 05/15/2021 05/14/2021 05/14/2021 05/14/2021 04/06/2021  HbA1c 4.8 - 5.6 % - 7.6(H) - - -  Microalbumin mg/dL - - - - -  Micro/Creat Ratio 0 - 29 mg/g creat - - - - -  Chol 100 - 199 mg/dL - - - - -  HDL >39 mg/dL - - - - -  Calc LDL 0 - 99 mg/dL - - - - -  Triglycerides 0 - 149 mg/dL - - - - -  Creatinine 0.44 - 1.00 mg/dL 1.76(H) - 1.87(H) 1.89(H) 1.77(H)   BP/Weight 06/05/2021 05/15/2021 05/14/2021 03/26/2021 01/27/2021 6/78/9381 0/05/7508  Systolic BP 258 527 - 782 423 - 536  Diastolic BP 72 68 - 78 65 - 66  Wt. (Lbs) 213 - 216 213.12 - 223.77 218  BMI 34.38 - 34.86 34.4 - 36.12 35.19   Foot/eye exam completion dates Latest Ref Rng & Units 07/15/2020 03/19/2020  Eye Exam No Retinopathy No Retinopathy -  Foot Form Completion - - Done

## 2021-06-14 NOTE — Assessment & Plan Note (Signed)
Ms. Whitsitt is reminded of the importance of commitment to daily physical activity for 30 minutes or more, as able and the need to limit carbohydrate intake to 30 to 60 grams per meal to help with blood sugar control.   The need to take medication as prescribed, test blood sugar as directed, and to call between visits if there is a concern that blood sugar is uncontrolled is also discussed.   Ms. Pumphrey is reminded of the importance of daily foot exam, annual eye examination, and good blood sugar, blood pressure and cholesterol control. Sub optimal, though adequate control Diabetic Labs Latest Ref Rng & Units 05/15/2021 05/14/2021 05/14/2021 05/14/2021 04/06/2021  HbA1c 4.8 - 5.6 % - 7.6(H) - - -  Microalbumin mg/dL - - - - -  Micro/Creat Ratio 0 - 29 mg/g creat - - - - -  Chol 100 - 199 mg/dL - - - - -  HDL >39 mg/dL - - - - -  Calc LDL 0 - 99 mg/dL - - - - -  Triglycerides 0 - 149 mg/dL - - - - -  Creatinine 0.44 - 1.00 mg/dL 1.76(H) - 1.87(H) 1.89(H) 1.77(H)   BP/Weight 06/05/2021 05/15/2021 05/14/2021 03/26/2021 01/27/2021 3/74/8270 11/14/6752  Systolic BP 492 010 - 071 219 - 758  Diastolic BP 72 68 - 78 65 - 66  Wt. (Lbs) 213 - 216 213.12 - 223.77 218  BMI 34.38 - 34.86 34.4 - 36.12 35.19   Foot/eye exam completion dates Latest Ref Rng & Units 07/15/2020 03/19/2020  Eye Exam No Retinopathy No Retinopathy -  Foot Form Completion - - Done

## 2021-06-14 NOTE — Assessment & Plan Note (Signed)
Tessalon perle, X pack and singulair prescribed

## 2021-06-14 NOTE — Assessment & Plan Note (Signed)
DASH diet and commitment to daily physical activity for a minimum of 30 minutes discussed and encouraged, as a part of hypertension management. The importance of attaining a healthy weight is also discussed.  BP/Weight 06/05/2021 05/15/2021 05/14/2021 03/26/2021 01/27/2021 06/19/3126 05/10/8865  Systolic BP 737 366 - 815 947 - 076  Diastolic BP 72 68 - 78 65 - 66  Wt. (Lbs) 213 - 216 213.12 - 223.77 218  BMI 34.38 - 34.86 34.4 - 36.12 35.19     Controlled, no change in medication

## 2021-06-14 NOTE — Assessment & Plan Note (Signed)
Maintained on anticoagullant

## 2021-06-25 ENCOUNTER — Other Ambulatory Visit: Payer: Self-pay | Admitting: Family Medicine

## 2021-06-26 ENCOUNTER — Other Ambulatory Visit: Payer: Self-pay | Admitting: Family Medicine

## 2021-07-06 ENCOUNTER — Other Ambulatory Visit: Payer: Self-pay | Admitting: Family Medicine

## 2021-07-10 ENCOUNTER — Other Ambulatory Visit: Payer: Self-pay | Admitting: Family Medicine

## 2021-07-15 LAB — HM DIABETES EYE EXAM

## 2021-07-17 ENCOUNTER — Other Ambulatory Visit: Payer: Self-pay | Admitting: Family Medicine

## 2021-07-29 ENCOUNTER — Other Ambulatory Visit: Payer: Self-pay | Admitting: Family Medicine

## 2021-07-29 DIAGNOSIS — D638 Anemia in other chronic diseases classified elsewhere: Secondary | ICD-10-CM | POA: Diagnosis not present

## 2021-07-29 DIAGNOSIS — N189 Chronic kidney disease, unspecified: Secondary | ICD-10-CM | POA: Diagnosis not present

## 2021-07-29 DIAGNOSIS — I5032 Chronic diastolic (congestive) heart failure: Secondary | ICD-10-CM | POA: Diagnosis not present

## 2021-07-29 DIAGNOSIS — E559 Vitamin D deficiency, unspecified: Secondary | ICD-10-CM | POA: Diagnosis not present

## 2021-07-29 DIAGNOSIS — E875 Hyperkalemia: Secondary | ICD-10-CM | POA: Diagnosis not present

## 2021-07-29 DIAGNOSIS — E1122 Type 2 diabetes mellitus with diabetic chronic kidney disease: Secondary | ICD-10-CM | POA: Diagnosis not present

## 2021-07-29 DIAGNOSIS — I129 Hypertensive chronic kidney disease with stage 1 through stage 4 chronic kidney disease, or unspecified chronic kidney disease: Secondary | ICD-10-CM | POA: Diagnosis not present

## 2021-08-03 ENCOUNTER — Other Ambulatory Visit: Payer: Self-pay | Admitting: Family Medicine

## 2021-08-10 ENCOUNTER — Other Ambulatory Visit: Payer: Self-pay | Admitting: Family Medicine

## 2021-08-18 ENCOUNTER — Other Ambulatory Visit: Payer: Self-pay

## 2021-08-18 MED ORDER — OLMESARTAN MEDOXOMIL 20 MG PO TABS: 20.0000 mg | ORAL_TABLET | Freq: Every day | ORAL | 0 refills | Status: DC

## 2021-08-24 ENCOUNTER — Telehealth: Payer: Self-pay

## 2021-08-24 ENCOUNTER — Other Ambulatory Visit: Payer: Self-pay | Admitting: Family Medicine

## 2021-08-24 NOTE — Telephone Encounter (Signed)
Pharmacy called and said this was from [place] but gave me the [phone] to call back regarding patient current med list.   ?

## 2021-08-26 ENCOUNTER — Telehealth: Payer: Self-pay

## 2021-08-26 NOTE — Telephone Encounter (Signed)
Kathleen [name] called from [facility] asked if patient still currently on med list for Losartan 20 mg, if so please send refill to [facility]. Anyf questions can call and leave a message on voicemail at [phone] ext [phone]. ?

## 2021-08-26 NOTE — Telephone Encounter (Signed)
Patient aware to bring medication to next visit  ?

## 2021-09-02 ENCOUNTER — Other Ambulatory Visit: Payer: Self-pay | Admitting: Family Medicine

## 2021-09-03 ENCOUNTER — Ambulatory Visit: Payer: Medicare Other | Admitting: Family Medicine

## 2021-09-04 ENCOUNTER — Other Ambulatory Visit: Payer: Self-pay | Admitting: Family Medicine

## 2021-09-05 DIAGNOSIS — E1122 Type 2 diabetes mellitus with diabetic chronic kidney disease: Secondary | ICD-10-CM | POA: Diagnosis not present

## 2021-09-05 DIAGNOSIS — D638 Anemia in other chronic diseases classified elsewhere: Secondary | ICD-10-CM | POA: Diagnosis not present

## 2021-09-05 DIAGNOSIS — I5032 Chronic diastolic (congestive) heart failure: Secondary | ICD-10-CM | POA: Diagnosis not present

## 2021-09-05 DIAGNOSIS — D472 Monoclonal gammopathy: Secondary | ICD-10-CM | POA: Diagnosis not present

## 2021-09-05 DIAGNOSIS — N189 Chronic kidney disease, unspecified: Secondary | ICD-10-CM | POA: Diagnosis not present

## 2021-09-05 DIAGNOSIS — I129 Hypertensive chronic kidney disease with stage 1 through stage 4 chronic kidney disease, or unspecified chronic kidney disease: Secondary | ICD-10-CM | POA: Diagnosis not present

## 2021-09-07 ENCOUNTER — Other Ambulatory Visit: Payer: Self-pay | Admitting: Family Medicine

## 2021-09-17 ENCOUNTER — Encounter: Payer: Self-pay | Admitting: Family Medicine

## 2021-09-17 ENCOUNTER — Telehealth: Payer: Self-pay | Admitting: Family Medicine

## 2021-09-17 ENCOUNTER — Ambulatory Visit (INDEPENDENT_AMBULATORY_CARE_PROVIDER_SITE_OTHER): Payer: No Typology Code available for payment source | Admitting: Family Medicine

## 2021-09-17 VITALS — BP 130/70 | HR 75 | Resp 16 | Ht 66.0 in | Wt 229.8 lb

## 2021-09-17 DIAGNOSIS — Z794 Long term (current) use of insulin: Secondary | ICD-10-CM

## 2021-09-17 DIAGNOSIS — Z1231 Encounter for screening mammogram for malignant neoplasm of breast: Secondary | ICD-10-CM

## 2021-09-17 DIAGNOSIS — I10 Essential (primary) hypertension: Secondary | ICD-10-CM | POA: Diagnosis not present

## 2021-09-17 DIAGNOSIS — J302 Other seasonal allergic rhinitis: Secondary | ICD-10-CM | POA: Diagnosis not present

## 2021-09-17 DIAGNOSIS — E118 Type 2 diabetes mellitus with unspecified complications: Secondary | ICD-10-CM | POA: Diagnosis not present

## 2021-09-17 DIAGNOSIS — M1712 Unilateral primary osteoarthritis, left knee: Secondary | ICD-10-CM

## 2021-09-17 DIAGNOSIS — E7849 Other hyperlipidemia: Secondary | ICD-10-CM

## 2021-09-17 MED ORDER — OLMESARTAN MEDOXOMIL 20 MG PO TABS: 20.0000 mg | ORAL_TABLET | Freq: Every day | ORAL | 3 refills | Status: DC

## 2021-09-17 MED ORDER — CLONIDINE HCL 0.3 MG PO TABS: ORAL_TABLET | ORAL | 1 refills | Status: DC

## 2021-09-17 MED ORDER — MIRTAZAPINE 7.5 MG PO TABS: 7.5000 mg | ORAL_TABLET | Freq: Every day | ORAL | 5 refills | Status: DC

## 2021-09-17 MED ORDER — OLMESARTAN MEDOXOMIL-HCTZ 20-12.5 MG PO TABS: 1.0000 | ORAL_TABLET | Freq: Every day | ORAL | 3 refills | Status: DC

## 2021-09-17 MED ORDER — MIRTAZAPINE 7.5 MG PO TABS: 7.5000 mg | ORAL_TABLET | Freq: Every day | ORAL | 3 refills | Status: DC

## 2021-09-17 MED ORDER — MONTELUKAST SODIUM 10 MG PO TABS: ORAL_TABLET | ORAL | 5 refills | Status: DC

## 2021-09-17 MED ORDER — HYDROCHLOROTHIAZIDE 25 MG PO TABS: 25.0000 mg | ORAL_TABLET | Freq: Every day | ORAL | 1 refills | Status: DC

## 2021-09-17 MED ORDER — GABAPENTIN 100 MG PO CAPS: 100.0000 mg | ORAL_CAPSULE | Freq: Two times a day (BID) | ORAL | 5 refills | Status: DC

## 2021-09-17 MED ORDER — ROSUVASTATIN CALCIUM 5 MG PO TABS: 5.0000 mg | ORAL_TABLET | Freq: Every day | ORAL | 5 refills | Status: DC

## 2021-09-17 MED ORDER — GLIPIZIDE ER 2.5 MG PO TB24: ORAL_TABLET | ORAL | 5 refills | Status: DC

## 2021-09-17 NOTE — Assessment & Plan Note (Signed)
Hyperlipidemia:Low fat diet discussed and encouraged. ? ? ?Lipid Panel  ?Lab Results  ?Component Value Date  ? CHOL 111 06/20/2020  ? HDL 30 (L) 06/20/2020  ? [place] 62 06/20/2020  ? TRIG 99 06/20/2020  ? CHOLHDL 3.7 06/20/2020  ? ? ? ?Needs to incexercise ?

## 2021-09-17 NOTE — Patient Instructions (Signed)
F/U end September, flu vaccine at visit, call if you need me sooner ? ?Labs today, lipid, cmp and EGFr and hBA1C ? ?Please schedule mammogram at checkout ? ?You are referred for eye exam ? ?Please get shingrix vaccines at your pharmacy ? ?Thanks for choosing [facility], we consider it a privelige to serve you. ? ?

## 2021-09-17 NOTE — Telephone Encounter (Signed)
Colon screening overdue. I think we discussed at visit , and she agreed on cologuard. Plscall and check on this, if so, pls order and explain the test,thanks ?

## 2021-09-17 NOTE — Progress Notes (Signed)
? ?Kathleen [name]     MRN: [id number]      DOB: [birth year] ? ? ?HPI ?[name] is here for follow up and re-evaluation of chronic medical conditions, medication management and review of any available recent lab and radiology data.  ?Preventive health is updated, specifically  Cancer screening and Immunization.   ?Questions or concerns regarding consultations or procedures which the PT has had in the interim are  addressed. ?The PT denies any adverse reactions to current medications since the last visit.  ?There are no new concerns.  ?There are no specific complaints  ? ?ROS ?Denies recent fever or chills. ?Denies sinus pressure, nasal congestion, ear pain or sore throat. ?Denies chest congestion, productive cough or wheezing. ?Denies chest pains, palpitations and leg swelling ?Denies abdominal pain, nausea, vomiting,diarrhea or constipation.   ?Denies dysuria, frequency, hesitancy or incontinence. ?Denies joint pain, swelling and limitation in mobility. ?Denies headaches, seizures, numbness, or tingling. ?Denies depression, anxiety or insomnia. ?Denies skin break down or rash. ? ? ?PE ? ?BP 130/70   Pulse 75   Resp 16   Ht '5\' 6"'$  (1.676 m)   Wt 229 lb 12.8 oz (104.2 kg)   SpO2 94%   BMI 37.09 kg/m?  ? ?Patient alert and oriented and in no cardiopulmonary distress. ? ?HEENT: No facial asymmetry, EOMI,     Neck supple . ? ?Chest: Clear to auscultation bilaterally. ? ?CVS: S1, S2 no murmurs, no S3.Regular rate. ? ?ABD: Soft non tender.  ? ?Ext: No edema ? ?MS: decreased ROM spine, shoulder , hips and knees ? ?Skin: Intact, no ulcerations or rash noted. ? ?Psych: Good eye contact, normal affect. Memory intact not anxious or depressed appearing. ? ?CNS: CN 2-12 intact, power,  normal throughout.no focal deficits noted. ? ? ?Assessment & Plan ? ?Essential hypertension ?Controlled, no change in medication ?DASH diet and commitment to daily physical activity for a minimum of 30 minutes discussed and encouraged, as a  part of hypertension management. ?The importance of attaining a healthy weight is also discussed. ? ? ?  09/17/2021  ?  2:31 PM 09/17/2021  ?  1:46 PM 06/05/2021  ?  2:17 PM 06/05/2021  ?  1:40 PM 05/15/2021  ?  9:30 AM 05/15/2021  ?  9:00 AM 05/15/2021  ?  8:30 AM  ?BP/Weight  ?Systolic BP 295 284 132 440 143 145 164  ?Diastolic BP 70 75 72 77 68 72 75  ?Wt. (Lbs)  229.8  213     ?BMI  37.09 kg/m2  34.38 kg/m2     ? ? ? ? ? ?Unilateral primary osteoarthritis, left knee ?Reports increased difficulty taking care of herself and doing basic household chores due to pain and instability, needs and wil benefit from assistance with ADL's , encouraged to continue toseek the help that she needs ? ?Morbid obesity ([place]) ?Obesity linked with HTN and diabetes ? ?Deteriorated ? ?Patient re-educated about  the importance of commitment to a  minimum of 150 minutes of exercise per week as able. ? ?The importance of healthy food choices with portion control discussed, as well as eating regularly and within a 12 hour window most days. ?The need to choose "clean , green" food 50 to 75% of the time is discussed, as well as to make water the primary drink and set a goal of 64 ounces water daily. ? ?  ? ?  09/17/2021  ?  1:46 PM 06/05/2021  ?  1:40 PM 05/14/2021  ?  5:32 PM  ?Weight /BMI  ?Weight 229 lb 12.8 oz 213 lb 216 lb  ?Height '5\' 6"'$  (1.676 m) '5\' 6"'$  (1.676 m) '5\' 6"'$  (1.676 m)  ?BMI 37.09 kg/m2 34.38 kg/m2 34.86 kg/m2  ? ? ? ? ?Type 2 diabetes mellitus ([place]) ?[name] is reminded of the importance of commitment to daily physical activity for 30 minutes or more, as able and the need to limit carbohydrate intake to 30 to 60 grams per meal to help with blood sugar control.  ? ?The need to take medication as prescribed, test blood sugar as directed, and to call between visits if there is a concern that blood sugar is uncontrolled is also discussed.  ? ?[name] is reminded of the importance of daily foot exam, annual eye examination, and good  blood sugar, blood pressure and cholesterol control. ? ? ?  Latest Ref Rng & Units 05/15/2021  ?  2:25 AM 05/14/2021  ?  9:27 PM 05/14/2021  ?  8:27 PM 05/14/2021  ? 12:17 PM 04/06/2021  ?  9:59 AM  ?Diabetic Labs  ?HbA1c 4.8 - 5.6 %  7.6       ?Creatinine 0.44 - 1.00 mg/dL 1.76    1.87   1.89   1.77    ? ? ?  09/17/2021  ?  2:31 PM 09/17/2021  ?  1:46 PM 06/05/2021  ?  2:17 PM 06/05/2021  ?  1:40 PM 05/15/2021  ?  9:30 AM 05/15/2021  ?  9:00 AM 05/15/2021  ?  8:30 AM  ?BP/Weight  ?Systolic BP 845 364 680 321 143 145 164  ?Diastolic BP 70 75 72 77 68 72 75  ?Wt. (Lbs)  229.8  213     ?BMI  37.09 kg/m2  34.38 kg/m2     ? ? ?  Latest Ref Rng & Units 09/17/2021  ?  1:40 PM 07/15/2020  ? 12:00 AM  ?Foot/eye exam completion dates  ?Eye Exam No Retinopathy  No Retinopathy       ?Foot Form Completion  Done   ?  ? This result is from an external source.  ? ? ? ? ?Updated lab needed at/ before next visit. ? ? ?Hyperlipidemia ?Hyperlipidemia:Low fat diet discussed and encouraged. ? ? ?Lipid Panel  ?Lab Results  ?Component Value Date  ? CHOL 111 06/20/2020  ? HDL 30 (L) 06/20/2020  ? [place] 62 06/20/2020  ? TRIG 99 06/20/2020  ? CHOLHDL 3.7 06/20/2020  ? ? ? ?Needs to incexercise ? ?Seasonal allergies ?Controlled, no change in medication ? ? ? ?

## 2021-09-17 NOTE — Assessment & Plan Note (Signed)
Reports increased difficulty taking care of herself and doing basic household chores due to pain and instability, needs and wil benefit from assistance with ADL's , encouraged to continue toseek the help that she needs ?

## 2021-09-17 NOTE — Assessment & Plan Note (Addendum)
Obesity linked with HTN and diabetes ? ?Deteriorated ? ?Patient re-educated about  the importance of commitment to a  minimum of 150 minutes of exercise per week as able. ? ?The importance of healthy food choices with portion control discussed, as well as eating regularly and within a 12 hour window most days. ?The need to choose "clean , green" food 50 to 75% of the time is discussed, as well as to make water the primary drink and set a goal of 64 ounces water daily. ? ?  ? ?  09/17/2021  ?  1:46 PM 06/05/2021  ?  1:40 PM 05/14/2021  ?  5:32 PM  ?Weight /BMI  ?Weight 229 lb 12.8 oz 213 lb 216 lb  ?Height '5\' 6"'$  (1.676 m) '5\' 6"'$  (1.676 m) '5\' 6"'$  (1.676 m)  ?BMI 37.09 kg/m2 34.38 kg/m2 34.86 kg/m2  ? ? ? ?

## 2021-09-17 NOTE — Assessment & Plan Note (Signed)
Controlled, no change in medication  

## 2021-09-17 NOTE — Assessment & Plan Note (Signed)
Controlled, no change in medication ?DASH diet and commitment to daily physical activity for a minimum of 30 minutes discussed and encouraged, as a part of hypertension management. ?The importance of attaining a healthy weight is also discussed. ? ? ?  09/17/2021  ?  2:31 PM 09/17/2021  ?  1:46 PM 06/05/2021  ?  2:17 PM 06/05/2021  ?  1:40 PM 05/15/2021  ?  9:30 AM 05/15/2021  ?  9:00 AM 05/15/2021  ?  8:30 AM  ?BP/Weight  ?Systolic BP 325 498 264 158 143 145 164  ?Diastolic BP 70 75 72 77 68 72 75  ?Wt. (Lbs)  229.8  213     ?BMI  37.09 kg/m2  34.38 kg/m2     ? ? ? ? ?

## 2021-09-17 NOTE — Assessment & Plan Note (Signed)
Ms. Boline is reminded of the importance of commitment to daily physical activity for 30 minutes or more, as able and the need to limit carbohydrate intake to 30 to 60 grams per meal to help with blood sugar control.  ? ?The need to take medication as prescribed, test blood sugar as directed, and to call between visits if there is a concern that blood sugar is uncontrolled is also discussed.  ? ?Ms. Mclucas is reminded of the importance of daily foot exam, annual eye examination, and good blood sugar, blood pressure and cholesterol control. ? ? ?  Latest Ref Rng & Units 05/15/2021  ?  2:25 AM 05/14/2021  ?  9:27 PM 05/14/2021  ?  8:27 PM 05/14/2021  ? 12:17 PM 04/06/2021  ?  9:59 AM  ?Diabetic Labs  ?HbA1c 4.8 - 5.6 %  7.6       ?Creatinine 0.44 - 1.00 mg/dL 1.76    1.87   1.89   1.77    ? ? ?  09/17/2021  ?  2:31 PM 09/17/2021  ?  1:46 PM 06/05/2021  ?  2:17 PM 06/05/2021  ?  1:40 PM 05/15/2021  ?  9:30 AM 05/15/2021  ?  9:00 AM 05/15/2021  ?  8:30 AM  ?BP/Weight  ?Systolic BP 465 681 275 170 143 145 164  ?Diastolic BP 70 75 72 77 68 72 75  ?Wt. (Lbs)  229.8  213     ?BMI  37.09 kg/m2  34.38 kg/m2     ? ? ?  Latest Ref Rng & Units 09/17/2021  ?  1:40 PM 07/15/2020  ? 12:00 AM  ?Foot/eye exam completion dates  ?Eye Exam No Retinopathy  No Retinopathy       ?Foot Form Completion  Done   ?  ? This result is from an external source.  ? ? ? ? ?Updated lab needed at/ before next visit. ? ?

## 2021-09-18 ENCOUNTER — Encounter: Payer: Self-pay | Admitting: Family Medicine

## 2021-09-18 ENCOUNTER — Other Ambulatory Visit: Payer: Self-pay | Admitting: Family Medicine

## 2021-09-18 DIAGNOSIS — Z1211 Encounter for screening for malignant neoplasm of colon: Secondary | ICD-10-CM

## 2021-09-18 LAB — CMP14+EGFR
ALT: 41 IU/L — ABNORMAL HIGH (ref 0–32)
AST: 27 IU/L (ref 0–40)
Albumin/Globulin Ratio: 1.3 (ref 1.2–2.2)
Albumin: 4.2 g/dL (ref 3.7–4.7)
Alkaline Phosphatase: 106 IU/L (ref 44–121)
BUN/Creatinine Ratio: 20 (ref 12–28)
BUN: 33 mg/dL — ABNORMAL HIGH (ref 8–27)
Bilirubin Total: <0.2 mg/dL (ref 0.0–1.2)
CO2: 21 mmol/L (ref 20–29)
Calcium: 9.7 mg/dL (ref 8.7–10.3)
Chloride: 105 mmol/L (ref 96–106)
Creatinine, Ser: 1.65 mg/dL — ABNORMAL HIGH (ref 0.57–1.00)
Globulin, Total: 3.3 g/dL (ref 1.5–4.5)
Glucose: 123 mg/dL — ABNORMAL HIGH (ref 70–99)
Potassium: 5.6 mmol/L — ABNORMAL HIGH (ref 3.5–5.2)
Sodium: 137 mmol/L (ref 134–144)
Total Protein: 7.5 g/dL (ref 6.0–8.5)
eGFR: 33 mL/min/{1.73_m2} — ABNORMAL LOW (ref >59)

## 2021-09-18 LAB — LIPID PANEL
Chol/HDL Ratio: 2.6 ratio (ref 0.0–4.4)
Cholesterol, Total: 91 mg/dL — ABNORMAL LOW (ref 100–199)
HDL: 35 mg/dL — ABNORMAL LOW (ref >39)
LDL Chol Calc (NIH): 33 mg/dL (ref 0–99)
Triglycerides: 129 mg/dL (ref 0–149)
VLDL Cholesterol Cal: 23 mg/dL (ref 5–40)

## 2021-09-18 LAB — HEMOGLOBIN A1C
Est. average glucose Bld gHb Est-mCnc: 151 mg/dL
Hgb A1c MFr Bld: 6.9 % — ABNORMAL HIGH (ref 4.8–5.6)

## 2021-09-18 NOTE — Telephone Encounter (Signed)
Cologuard ordered. Called pt no answer ?

## 2021-09-22 ENCOUNTER — Other Ambulatory Visit: Payer: Self-pay

## 2021-09-22 MED ORDER — OLMESARTAN MEDOXOMIL 20 MG PO TABS: 20.0000 mg | ORAL_TABLET | Freq: Every day | ORAL | 3 refills | Status: DC

## 2021-09-22 MED ORDER — ROSUVASTATIN CALCIUM 5 MG PO TABS: 5.0000 mg | ORAL_TABLET | Freq: Every day | ORAL | 5 refills | Status: DC

## 2021-09-24 ENCOUNTER — Other Ambulatory Visit: Payer: Self-pay

## 2021-09-24 MED ORDER — ROSUVASTATIN CALCIUM 5 MG PO TABS: 5.0000 mg | ORAL_TABLET | Freq: Every day | ORAL | 5 refills | Status: DC

## 2021-09-24 MED ORDER — OLMESARTAN MEDOXOMIL 20 MG PO TABS: 20.0000 mg | ORAL_TABLET | Freq: Every day | ORAL | 3 refills | Status: DC

## 2021-09-24 NOTE — Telephone Encounter (Signed)
Pt aware.

## 2021-09-25 ENCOUNTER — Other Ambulatory Visit: Payer: Self-pay | Admitting: Family Medicine

## 2021-10-02 LAB — COLOGUARD

## 2021-10-07 ENCOUNTER — Inpatient Hospital Stay: Admission: RE | Admit: 2021-10-07 | Payer: No Typology Code available for payment source | Source: Ambulatory Visit

## 2021-10-10 ENCOUNTER — Other Ambulatory Visit: Payer: Self-pay | Admitting: Internal Medicine

## 2021-10-12 ENCOUNTER — Other Ambulatory Visit: Payer: Self-pay | Admitting: Family Medicine

## 2021-10-12 DIAGNOSIS — Z1211 Encounter for screening for malignant neoplasm of colon: Secondary | ICD-10-CM | POA: Diagnosis not present

## 2021-10-19 ENCOUNTER — Other Ambulatory Visit: Payer: Self-pay | Admitting: Family Medicine

## 2021-10-19 LAB — COLOGUARD: COLOGUARD: NEGATIVE

## 2021-10-28 ENCOUNTER — Ambulatory Visit
Admission: RE | Admit: 2021-10-28 | Discharge: 2021-10-28 | Disposition: A | Discharge status: Home / Self Care | Payer: No Typology Code available for payment source | Source: Ambulatory Visit | Attending: Family Medicine | Admitting: Family Medicine

## 2021-10-28 DIAGNOSIS — Z1231 Encounter for screening mammogram for malignant neoplasm of breast: Secondary | ICD-10-CM | POA: Diagnosis not present

## 2021-11-02 ENCOUNTER — Other Ambulatory Visit: Payer: Self-pay | Admitting: Family Medicine

## 2021-11-02 DIAGNOSIS — R928 Other abnormal and inconclusive findings on diagnostic imaging of breast: Secondary | ICD-10-CM

## 2021-11-11 ENCOUNTER — Other Ambulatory Visit: Payer: Self-pay | Admitting: Family Medicine

## 2021-11-11 ENCOUNTER — Other Ambulatory Visit: Payer: Self-pay | Admitting: Internal Medicine

## 2021-11-11 DIAGNOSIS — E1122 Type 2 diabetes mellitus with diabetic chronic kidney disease: Secondary | ICD-10-CM | POA: Diagnosis not present

## 2021-11-11 DIAGNOSIS — N189 Chronic kidney disease, unspecified: Secondary | ICD-10-CM | POA: Diagnosis not present

## 2021-11-11 DIAGNOSIS — I5032 Chronic diastolic (congestive) heart failure: Secondary | ICD-10-CM | POA: Diagnosis not present

## 2021-11-11 DIAGNOSIS — I129 Hypertensive chronic kidney disease with stage 1 through stage 4 chronic kidney disease, or unspecified chronic kidney disease: Secondary | ICD-10-CM | POA: Diagnosis not present

## 2021-11-11 DIAGNOSIS — D472 Monoclonal gammopathy: Secondary | ICD-10-CM | POA: Diagnosis not present

## 2021-11-11 DIAGNOSIS — D638 Anemia in other chronic diseases classified elsewhere: Secondary | ICD-10-CM | POA: Diagnosis not present

## 2021-11-12 DIAGNOSIS — E1122 Type 2 diabetes mellitus with diabetic chronic kidney disease: Secondary | ICD-10-CM | POA: Diagnosis not present

## 2021-11-12 DIAGNOSIS — I5033 Acute on chronic diastolic (congestive) heart failure: Secondary | ICD-10-CM | POA: Diagnosis not present

## 2021-11-12 DIAGNOSIS — N189 Chronic kidney disease, unspecified: Secondary | ICD-10-CM | POA: Diagnosis not present

## 2021-11-12 DIAGNOSIS — D638 Anemia in other chronic diseases classified elsewhere: Secondary | ICD-10-CM | POA: Diagnosis not present

## 2021-11-12 DIAGNOSIS — D472 Monoclonal gammopathy: Secondary | ICD-10-CM | POA: Diagnosis not present

## 2021-11-12 DIAGNOSIS — I129 Hypertensive chronic kidney disease with stage 1 through stage 4 chronic kidney disease, or unspecified chronic kidney disease: Secondary | ICD-10-CM | POA: Diagnosis not present

## 2021-12-01 ENCOUNTER — Telehealth: Payer: Self-pay

## 2021-12-01 NOTE — Telephone Encounter (Signed)
[employer] is calling asking if this medicine still active because patient is saying not taking it any longer.  rosuvastatin (CRESTOR) 5 MG tablet   Zigmund Daniel from [facility] called, please return call [phone] ext [phone].

## 2021-12-03 ENCOUNTER — Telehealth: Payer: Self-pay

## 2021-12-03 NOTE — Telephone Encounter (Signed)
Still active

## 2021-12-03 NOTE — Telephone Encounter (Signed)
Zigmund Daniel called from [place] rosuvastatin (CRESTOR) 5 MG tablet [[id number]]  If still active. Call [place] back at   Call back # [phone] ext [phone]

## 2021-12-04 ENCOUNTER — Telehealth: Payer: Self-pay | Admitting: Family Medicine

## 2021-12-04 ENCOUNTER — Ambulatory Visit (INDEPENDENT_AMBULATORY_CARE_PROVIDER_SITE_OTHER): Payer: No Typology Code available for payment source

## 2021-12-04 DIAGNOSIS — Z0000 Encounter for general adult medical examination without abnormal findings: Secondary | ICD-10-CM

## 2021-12-04 NOTE — Telephone Encounter (Signed)
I returned call to [facility] and she mentioned nothing about any medication

## 2021-12-04 NOTE — Patient Instructions (Signed)
Kathleen [name] , Thank you for taking time to come for your Medicare Wellness Visit. I appreciate your ongoing commitment to your health goals. Please review the following plan we discussed and let me know if I can assist you in the future.   Screening recommendations/referrals: Colonoscopy: Completed Mammogram: Completed Bone Density: Completed Recommended yearly ophthalmology/optometry visit for glaucoma screening and checkup Recommended yearly dental visit for hygiene and checkup  Vaccinations: Influenza vaccine: Completed Pneumococcal vaccine: Completed Tdap vaccine: please get at your next visit Shingles vaccine: Please get at your local pharmacy    Advanced directives: patient declined  Conditions/risks identified: falls, hypertension, diabetes  Next appointment: 1 year   Preventive Care 31 Years and Older, Female Preventive care refers to lifestyle choices and visits with your health care provider that can promote health and wellness. What does preventive care include? A yearly physical exam. This is also called an annual well check. Dental exams once or twice a year. Routine eye exams. Ask your health care provider how often you should have your eyes checked. Personal lifestyle choices, including: Daily care of your teeth and gums. Regular physical activity. Eating a healthy diet. Avoiding tobacco and drug use. Limiting alcohol use. Practicing safe sex. Taking low-dose aspirin every day. Taking vitamin and mineral supplements as recommended by your health care provider. What happens during an annual well check? The services and screenings done by your health care provider during your annual well check will depend on your age, overall health, lifestyle risk factors, and family history of disease. Counseling  Your health care provider may ask you questions about your: Alcohol use. Tobacco use. Drug use. Emotional well-being. Home and relationship well-being. Sexual  activity. Eating habits. History of falls. Memory and ability to understand (cognition). Work and work Statistician. Reproductive health. Screening  You may have the following tests or measurements: Height, weight, and BMI. Blood pressure. Lipid and cholesterol levels. These may be checked every 5 years, or more frequently if you are over 72 years old. Skin check. Lung cancer screening. You may have this screening every year starting at age 65 if you have a 30-pack-year history of smoking and currently smoke or have quit within the past 15 years. Fecal occult blood test (FOBT) of the stool. You may have this test every year starting at age 55. Flexible sigmoidoscopy or colonoscopy. You may have a sigmoidoscopy every 5 years or a colonoscopy every 10 years starting at age 63. Hepatitis C blood test. Hepatitis B blood test. Sexually transmitted disease (STD) testing. Diabetes screening. This is done by checking your blood sugar (glucose) after you have not eaten for a while (fasting). You may have this done every 1-3 years. Bone density scan. This is done to screen for osteoporosis. You may have this done starting at age 72. Mammogram. This may be done every 1-2 years. Talk to your health care provider about how often you should have regular mammograms. Talk with your health care provider about your test results, treatment options, and if necessary, the need for more tests. Vaccines  Your health care provider may recommend certain vaccines, such as: Influenza vaccine. This is recommended every year. Tetanus, diphtheria, and acellular pertussis (Tdap, Td) vaccine. You may need a Td booster every 10 years. Zoster vaccine. You may need this after age 23. Pneumococcal 13-valent conjugate (PCV13) vaccine. One dose is recommended after age 71. Pneumococcal polysaccharide (PPSV23) vaccine. One dose is recommended after age 17. Talk to your health care provider about which screenings  and vaccines  you need and how often you need them. This information is not intended to replace advice given to you by your health care provider. Make sure you discuss any questions you have with your health care provider. Document Released: 05/23/2015 Document Revised: 01/14/2016 Document Reviewed: 02/25/2015 Elsevier Interactive Patient Education  2017 [employer] Prevention in the Home Falls can cause injuries. They can happen to people of all ages. There are many things you can do to make your home safe and to help prevent falls. What can I do on the outside of my home? Regularly fix the edges of walkways and driveways and fix any cracks. Remove anything that might make you trip as you walk through a door, such as a raised step or threshold. Trim any bushes or trees on the path to your home. Use bright outdoor lighting. Clear any walking paths of anything that might make someone trip, such as rocks or tools. Regularly check to see if handrails are loose or broken. Make sure that both sides of any steps have handrails. Any raised decks and porches should have guardrails on the edges. Have any leaves, snow, or ice cleared regularly. Use sand or salt on walking paths during winter. Clean up any spills in your garage right away. This includes oil or grease spills. What can I do in the bathroom? Use night lights. Install grab bars by the toilet and in the tub and shower. Do not use towel bars as grab bars. Use non-skid mats or decals in the tub or shower. If you need to sit down in the shower, use a plastic, non-slip stool. Keep the floor dry. Clean up any water that spills on the floor as soon as it happens. Remove soap buildup in the tub or shower regularly. Attach bath mats securely with double-sided non-slip rug tape. Do not have throw rugs and other things on the floor that can make you trip. What can I do in the bedroom? Use night lights. Make sure that you have a light by your bed that  is easy to reach. Do not use any sheets or blankets that are too big for your bed. They should not hang down onto the floor. Have a firm chair that has side arms. You can use this for support while you get dressed. Do not have throw rugs and other things on the floor that can make you trip. What can I do in the kitchen? Clean up any spills right away. Avoid walking on wet floors. Keep items that you use a lot in easy-to-reach places. If you need to reach something above you, use a strong step stool that has a grab bar. Keep electrical cords out of the way. Do not use floor polish or wax that makes floors slippery. If you must use wax, use non-skid floor wax. Do not have throw rugs and other things on the floor that can make you trip. What can I do with my stairs? Do not leave any items on the stairs. Make sure that there are handrails on both sides of the stairs and use them. Fix handrails that are broken or loose. Make sure that handrails are as long as the stairways. Check any carpeting to make sure that it is firmly attached to the stairs. Fix any carpet that is loose or worn. Avoid having throw rugs at the top or bottom of the stairs. If you do have throw rugs, attach them to the floor with carpet tape.  Make sure that you have a light switch at the top of the stairs and the bottom of the stairs. If you do not have them, ask someone to add them for you. What else can I do to help prevent falls? Wear shoes that: Do not have high heels. Have rubber bottoms. Are comfortable and fit you well. Are closed at the toe. Do not wear sandals. If you use a stepladder: Make sure that it is fully opened. Do not climb a closed stepladder. Make sure that both sides of the stepladder are locked into place. Ask someone to hold it for you, if possible. Clearly mark and make sure that you can see: Any grab bars or handrails. First and last steps. Where the edge of each step is. Use tools that help you  move around (mobility aids) if they are needed. These include: Canes. Walkers. Scooters. Crutches. Turn on the lights when you go into a dark area. Replace any light bulbs as soon as they burn out. Set up your furniture so you have a clear path. Avoid moving your furniture around. If any of your floors are uneven, fix them. If there are any pets around you, be aware of where they are. Review your medicines with your doctor. Some medicines can make you feel dizzy. This can increase your chance of falling. Ask your doctor what other things that you can do to help prevent falls. This information is not intended to replace advice given to you by your health care provider. Make sure you discuss any questions you have with your health care provider. Document Released: 02/20/2009 Document Revised: 10/02/2015 Document Reviewed: 05/31/2014 Elsevier Interactive Patient Education  2017 [employer].

## 2021-12-04 NOTE — Telephone Encounter (Signed)
[facility] called back wanting to know about the status in regards to the medication message that was sent yesterday

## 2021-12-04 NOTE — Progress Notes (Signed)
Subjective:   Kathleen [name] is a 71 y.o. female who presents for Medicare Annual (Subsequent) preventive examination. I connected with  Kathleen [name] on 81/44/81 by a audio enabled telemedicine application and verified that I am speaking with the correct person using two identifiers.  Patient Location: Home  Provider Location: Office/Clinic  I discussed the limitations of evaluation and management by telemedicine. The patient expressed understanding and agreed to proceed.   Review of Systems     Kathleen [name] , Thank you for taking time to come for your Medicare Wellness Visit. I appreciate your ongoing commitment to your health goals. Please review the following plan we discussed and let me know if I can assist you in the future.   These are the goals we discussed:  Goals      HEMOGLOBIN A1C < 7.0     Increase physical activity     Start walking 5 days a week for 15-20 mins     Reduce carbohydrate intake     Starting 04/20/2016 try to decrease amount of carbohydrates in your diet.        This is a list of the screening recommended for you and due dates:  Health Maintenance  Topic Date Due   Zoster (Shingles) Vaccine (1 of 2) Never done   Colon Cancer Screening  12/02/2019   COVID-19 Vaccine (4 - Booster for Moderna series) 01/23/2021   Tetanus Vaccine  06/24/2022*   Flu Shot  12/08/2021   Hemoglobin A1C  03/20/2022   Eye exam for diabetics  07/16/2022   Complete foot exam   09/18/2022   Mammogram  10/29/2023   Pneumonia Vaccine  Completed   DEXA scan (bone density measurement)  Completed   Hepatitis C Screening: USPSTF Recommendation to screen - Ages 77-79 yo.  Completed   HPV Vaccine  Aged Out  *Topic was postponed. The date shown is not the original due date.          Objective:    There were no vitals filed for this visit. There is no height or weight on file to calculate BMI.     05/14/2021    5:31 PM 01/19/2021   10:41 AM 01/01/2021    3:36 PM  12/03/2020   10:55 AM 12/03/2019   11:05 AM 10/22/2019    3:21 PM 10/22/2019    9:54 AM  Advanced Directives  Does Patient Have a Medical Advance Directive? No No No No No No No  Would patient like information on creating a medical advance directive? No - Patient declined No - Patient declined No - Patient declined Yes (MAU/Ambulatory/Procedural Areas - Information given) Yes (ED - Information included in AVS) No - Patient declined No - Patient declined    Current Medications (verified) Outpatient Encounter Medications as of 12/04/2021  Medication Sig   amLODipine (NORVASC) 10 MG tablet TAKE ONE TABLET BY MOUTH ONCE DAILY FOR BLOOD PRESSURE.   aspirin EC 81 MG tablet Take 81 mg by mouth daily. Swallow whole.   cholecalciferol (VITAMIN D) 1000 UNITS tablet Take 1,000 Units by mouth daily.   cloNIDine (CATAPRES) 0.3 MG tablet TAKE (1) TABLET BY MOUTH 3 TIMES DAILY.   ELIQUIS 5 MG TABS tablet TAKE (1) TABLET BY MOUTH TWICE DAILY.   fluticasone (FLONASE) 50 MCG/ACT nasal spray Place into both nostrils daily.   gabapentin (NEURONTIN) 100 MG capsule Take 1 capsule (100 mg total) by mouth 2 (two) times daily.   glipiZIDE (GLUCOTROL XL) 2.5 MG 24  hr tablet TAKE (1) TABLET BY MOUTH ONCE DAILY.   glucose blood (ACCU-CHEK GUIDE) test strip USE TO TEST BLOOD SUGAR THREE TIMES DAILY AS DIRECTED      DX E11.65   hydrochlorothiazide (HYDRODIURIL) 25 MG tablet Take 1 tablet (25 mg total) by mouth daily.   LANTUS SOLOSTAR 100 UNIT/ML Solostar Pen INJECT 60 UNITS INTO THE SKIN ONCE DAILY.   mirtazapine (REMERON) 7.5 MG tablet Take 1 tablet (7.5 mg total) by mouth at bedtime. This is for sleep and for your appetitie   montelukast (SINGULAIR) 10 MG tablet TAKE (1) TABLET BY MOUTH AT BEDTIME.   olmesartan (BENICAR) 20 MG tablet Take 1 tablet (20 mg total) by mouth daily.   rosuvastatin (CRESTOR) 5 MG tablet Take 1 tablet (5 mg total) by mouth daily.   [DISCONTINUED] sitaGLIPtan (JANUVIA) 100 MG tablet Take 100 mg  by mouth daily.     No facility-administered encounter medications on file as of 12/04/2021.    Allergies (verified) Patient has no known allergies.   History: Past Medical History:  Diagnosis Date   Acute respiratory failure with hypoxia ([place]) 10/22/2019   Anemia    Arthritis    Chronic kidney disease    Diabetes mellitus type II    Dysphagia    unspecified    GERD (gastroesophageal reflux disease)    Hyperlipidemia 2000   Hypertension 1995   Insomnia    Low back pain    Obesity    PE (pulmonary thromboembolism) ([place])    Sleep apnea in adult 11/25/2019   Thyroid nodule 11/25/2019   Past Surgical History:  Procedure Laterality Date   Carpal tunnel release     left    CHOLECYSTECTOMY  2007   DIGIT NAIL REMOVAL  08/2011   KNEE SURGERY  1.20.2012   arthroscopy LEFT knee partial medial meniscectomy   TENOTOMY     2,3,4  left foot    toenail removal     TUBAL LIGATION     Family History  Problem Relation Age of Onset   Diabetes Mother    Hypertension Mother        MI   Heart disease Mother    Kidney disease Mother 67       dialysis   Diabetes Father    Diabetes Sister    Dementia Sister    Diabetes Brother    Diabetes Brother    Kidney disease Brother    Breast cancer Neg Hx    Social History   Socioeconomic History   Marital status: Divorced    Spouse name: Not on file   Number of children: 4   Years of education: Not on file   Highest education level: 11th grade  Occupational History   Occupation: Full time Engineer, petroleum   Tobacco Use   Smoking status: Never   Smokeless tobacco: Never  Vaping Use   Vaping Use: Never used  Substance and Sexual Activity   Alcohol use: No   Drug use: No   Sexual activity: Yes  Other Topics Concern   Not on file  Social History Narrative   Lives with 2nd oldest son   Social Determinants of Health   Financial Resource Strain: Low Risk  (12/03/2020)   Overall Financial Resource Strain (CARDIA)    Difficulty of  Paying Living Expenses: Not hard at all  Food Insecurity: No Food Insecurity (12/03/2020)   Hunger Vital Sign    Worried About Running Out of Food in the Last Year: Never  true    Ran Out of Food in the Last Year: Never true  Transportation Needs: No Transportation Needs (12/03/2020)   PRAPARE - Hydrologist (Medical): No    Lack of Transportation (Non-Medical): No  Physical Activity: Insufficiently Active (12/03/2020)   Exercise Vital Sign    Days of Exercise per Week: 3 days    Minutes of Exercise per Session: 30 min  Stress: No Stress Concern Present (12/03/2020)   [place]    Feeling of Stress : Not at all  Social Connections: Moderately Isolated (12/03/2020)   Social Connection and Isolation Panel [NHANES]    Frequency of Communication with Friends and Family: More than three times a week    Frequency of Social Gatherings with Friends and Family: More than three times a week    Attends Religious Services: More than 4 times per year    Active Member of [employer] or Organizations: No    Attends Archivist Meetings: Never    Marital Status: Divorced    Tobacco Counseling Counseling given: Not Answered   Clinical Intake:                 Diabetic? Yes  Nutrition Risk Assessment:  Has the patient had any N/V/D within the last 2 months?  No  Does the patient have any non-healing wounds?  No  Has the patient had any unintentional weight loss or weight gain?  No   Diabetes:  Is the patient diabetic?  Yes  If diabetic, was a CBG obtained today?  Yes  Did the patient bring in their glucometer from home?  No  How often do you monitor your CBG's? Twice a day.   Financial Strains and Diabetes Management:  Are you having any financial strains with the device, your supplies or your medication? No .  Does the patient want to be seen by Chronic Care Management for management  of their diabetes?  No  Would the patient like to be referred to a Nutritionist or for Diabetic Management?  No   Diabetic Exams:  Diabetic Eye Exam: Completed 07/15/2021.   Diabetic Foot Exam: Completed 09/17/2021.         Activities of Daily Living    01/27/2021    9:00 AM 01/19/2021   10:36 AM  In your present state of health, do you have any difficulty performing the following activities:  Hearing?  0  Vision?  1  Difficulty concentrating or making decisions?  0  Walking or climbing stairs?  1  Dressing or bathing?  1  Doing errands, shopping? 0     Patient Care Team: Fayrene Helper, MD as PCP - General Carver, Elon Alas, DO as Consulting Physician (Internal Medicine)  Indicate any recent Medical Services you may have received from other than [facility] providers in the past year (date may be approximate).     Assessment:   This is a routine wellness examination for Kathleen [name].  Hearing/Vision screen No results found.  Dietary issues and exercise activities discussed:     Goals Addressed   None   Depression Screen    03/26/2021   10:49 AM 01/08/2021   10:19 AM 12/03/2020   10:55 AM 12/03/2020   10:53 AM 11/17/2020    1:48 PM 08/05/2020    9:34 AM 07/16/2020    8:18 AM  PHQ 2/9 Scores  PHQ - 2 Score 0 0 0 0 0 0 0  PHQ- 9 Score 0          Fall Risk    09/17/2021    1:49 PM 06/05/2021    1:43 PM 03/26/2021   10:49 AM 01/08/2021   10:18 AM 12/03/2020   10:55 AM  Fall Risk   Falls in the past year? 0 0 0 0 0  Number falls in past yr: 0 0 0 0 0  Injury with Fall? 0 0 0 0 0  Risk for fall due to :     No Fall Risks  Follow up     Falls evaluation completed    FALL RISK PREVENTION PERTAINING TO THE HOME:  Any stairs in or around the home? Yes  If so, are there any without handrails? No  Home free of loose throw rugs in walkways, pet beds, electrical cords, etc? Yes  Adequate lighting in your home to reduce risk of falls? Yes   ASSISTIVE DEVICES UTILIZED TO  PREVENT FALLS:  Life alert? No  Use of a cane, walker or w/c? Yes  Grab bars in the bathroom? No  Shower chair or bench in shower? Yes  Elevated toilet seat or a handicapped toilet? No    Cognitive Function:    12/03/2020   10:56 AM  MMSE - Mini Mental State Exam  Not completed: Unable to complete        12/03/2020   10:56 AM 12/03/2019   11:07 AM 09/20/2018    2:57 PM 04/27/2017   12:20 PM 04/20/2016   11:46 AM  6CIT Screen  What Year? 0 points 0 points 0 points 0 points 0 points  What month? 0 points 0 points 0 points 0 points 0 points  What time? 0 points 0 points 0 points 0 points 0 points  Count back from 20 0 points 0 points 0 points 0 points 0 points  Months in reverse 4 points 0 points 0 points 0 points 0 points  Repeat phrase 0 points 2 points 0 points 0 points 0 points  Total Score 4 points 2 points 0 points 0 points 0 points    Immunizations Immunization History  Administered Date(s) Administered   Fluad Quad(high Dose 65+) 03/06/2019, 01/30/2020, 01/08/2021   Influenza Split 02/23/2011, 03/14/2012   Influenza Whole 03/24/2007, 02/21/2008, 02/26/2010   Influenza,inj,Quad PF,6+ Mos 04/23/2014, 04/15/2015, 12/29/2015, 02/14/2017, 12/27/2017   Moderna SARS-COV2 Booster Vaccination 11/28/2020   Moderna Sars-Covid-2 Vaccination 07/26/2019, 08/28/2019, 06/06/2020   Pneumococcal Conjugate-13 12/26/2013   Pneumococcal Polysaccharide-23 11/29/2008, 08/12/2015   Td 02/03/2009   Zoster, Live 10/28/2010    TDAP status: Due, Education has been provided regarding the importance of this vaccine. Advised may receive this vaccine at local pharmacy or Health Dept. Aware to provide a copy of the vaccination record if obtained from local pharmacy or Health Dept. Verbalized acceptance and understanding.  Flu Vaccine status: Up to date  Pneumococcal vaccine status: Up to date  Covid-19 vaccine status: Completed vaccines  Qualifies for Shingles Vaccine? Yes   Zostavax  completed No   Shingrix Completed?: No.    Education has been provided regarding the importance of this vaccine. Patient has been advised to call insurance company to determine out of pocket expense if they have not yet received this vaccine. Advised may also receive vaccine at local pharmacy or Health Dept. Verbalized acceptance and understanding.  Screening Tests Health Maintenance  Topic Date Due   Zoster Vaccines- Shingrix (1 of 2) Never done   COLONOSCOPY (Pts 45-45yr Insurance coverage will  need to be confirmed)  12/02/2019   COVID-19 Vaccine (4 - Booster for Moderna series) 01/23/2021   TETANUS/TDAP  06/24/2022 (Originally 02/04/2019)   INFLUENZA VACCINE  12/08/2021   HEMOGLOBIN A1C  03/20/2022   OPHTHALMOLOGY EXAM  07/16/2022   FOOT EXAM  09/18/2022   MAMMOGRAM  10/29/2023   Pneumonia Vaccine 49+ Years old  Completed   DEXA SCAN  Completed   Hepatitis C Screening  Completed   HPV VACCINES  Aged Out    Health Maintenance  Health Maintenance Due  Topic Date Due   Zoster Vaccines- Shingrix (1 of 2) Never done   COLONOSCOPY (Pts 45-63yr Insurance coverage will need to be confirmed)  12/02/2019   COVID-19 Vaccine (4 - Booster for Moderna series) 01/23/2021    Colorectal cancer screening: Type of screening: Cologuard. Completed 10/12/2021. Repeat every 3 years  Mammogram status: Completed 10/28/2021. Repeat every year  Bone Density status: Completed 08/09/2014. Results reflect: Bone density results: NORMAL. Repeat every 5 years.  Lung Cancer Screening: (Low Dose CT Chest recommended if Age 73-80years, 30 pack-year currently smoking OR have quit w/in 15years.) does not qualify.    Additional Screening:  Hepatitis C Screening: does qualify; Completed 12/16/2014  Vision Screening: Recommended annual ophthalmology exams for early detection of glaucoma and other disorders of the eye. Is the patient up to date with their annual eye exam?  Yes  Who is the provider or what is  the name of the office in which the patient attends annual eye exams? [facility]  Dental Screening: Recommended annual dental exams for proper oral hygiene  Community Resource Referral / Chronic Care Management: CRR required this visit?  No   CCM required this visit?  No      Plan:     I have personally reviewed and noted the following in the patient's chart:   Medical and social history Use of alcohol, tobacco or illicit drugs  Current medications and supplements including opioid prescriptions.  Functional ability and status Nutritional status Physical activity Advanced directives List of other physicians Hospitalizations, surgeries, and ER visits in previous 12 months Vitals Screenings to include cognitive, depression, and falls Referrals and appointments  In addition, I have reviewed and discussed with patient certain preventive protocols, quality metrics, and best practice recommendations. A written personalized care plan for preventive services as well as general preventive health recommendations were provided to patient.     [place]y Drilling [place]  12/04/2021   Nurse Notes:  Kathleen [name], Thank you for taking time to come for your Medicare Wellness Visit. I appreciate your ongoing commitment to your health goals. Please review the following plan we discussed and let me know if I can assist you in the future.   These are the goals we discussed:  Goals      HEMOGLOBIN A1C < 7.0     Increase physical activity     Start walking 5 days a week for 15-20 mins     Reduce carbohydrate intake     Starting 04/20/2016 try to decrease amount of carbohydrates in your diet.        This is a list of the screening recommended for you and due dates:  Health Maintenance  Topic Date Due   Zoster (Shingles) Vaccine (1 of 2) Never done   Colon Cancer Screening  12/02/2019   COVID-19 Vaccine (4 - Booster for Moderna series) 01/23/2021   Tetanus Vaccine  06/24/2022*   Flu Shot   12/08/2021  Hemoglobin A1C  03/20/2022   Eye exam for diabetics  07/16/2022   Complete foot exam   09/18/2022   Mammogram  10/29/2023   Pneumonia Vaccine  Completed   DEXA scan (bone density measurement)  Completed   Hepatitis C Screening: USPSTF Recommendation to screen - Ages 4-79 yo.  Completed   HPV Vaccine  Aged Out  *Topic was postponed. The date shown is not the original due date.

## 2021-12-07 ENCOUNTER — Telehealth: Payer: Self-pay | Admitting: Family Medicine

## 2021-12-07 NOTE — Telephone Encounter (Signed)
Kathleen [name] with [facility] call back in regards to wanting to know if a medication was still active for pt??   rosuvastatin (CRESTOR) 5 MG tablet   Kathleen [name] ph# [phone]

## 2021-12-09 NOTE — Telephone Encounter (Signed)
Tried to call them back and told them I was returning call and the person I spoke to didn't know what I was talking about

## 2021-12-10 NOTE — Patient Instructions (Addendum)
[place] at [facility] Discharge Instructions  You were seen and examined today by Dr. Delton Coombes. Dr. Delton Coombes is a hematologist, meaning that he specializes in blood abnormalities. Dr. Delton Coombes discussed your past medical history, family history of cancers/blood conditions and the events that led to you being here today.  You were referred to Dr. Delton Coombes by Dr. Theador Hawthorne due to an abnormal protein presence on your recent blood work. This is typically referred to as Monoclonal Gammopathy of Unknown Significance (MGUS). This is a non-cancerous condition that requires regular monitoring with lab work.  Dr. Delton Coombes has recommended additional labs today. Dr. Delton Coombes has also recommended a whole-body bone X-Ray (Skeletal Survey) because over time, the abnormal protein can impact your bone. This will serve as a baseline and may also be repeated on a regular basis.  Follow-up as scheduled.  Thank you for choosing [place] at [facility] to provide your oncology and hematology care.  To afford each patient quality time with our provider, please arrive at least 15 minutes before your scheduled appointment time.   If you have a lab appointment with the [facility] please come in thru the Main Entrance and check in at the main information desk.  You need to re-schedule your appointment should you arrive 10 or more minutes late.  We strive to give you quality time with our providers, and arriving late affects you and other patients whose appointments are after yours.  Also, if you no show three or more times for appointments you may be dismissed from the clinic at the providers discretion.     Again, thank you for choosing [facility].  Our hope is that these requests will decrease the amount of time that you wait before being seen by our physicians.       _____________________________________________________________  Should you  have questions after your visit to [facility], please contact our office at [phone] and follow the prompts.  Our office hours are 8:00 a.m. and 4:30 p.m. Monday - Friday.  Please note that voicemails left after 4:00 p.m. may not be returned until the following business day.  We are closed weekends and major holidays.  You do have access to a nurse 24-7, just call the main number to the clinic [phone] and do not press any options, hold on the line and a nurse will answer the phone.    For prescription refill requests, have your pharmacy contact our office and allow 72 hours.

## 2021-12-11 ENCOUNTER — Other Ambulatory Visit: Payer: Self-pay | Admitting: Family Medicine

## 2021-12-11 ENCOUNTER — Inpatient Hospital Stay: Payer: No Typology Code available for payment source

## 2021-12-11 ENCOUNTER — Inpatient Hospital Stay: Payer: No Typology Code available for payment source | Attending: Hematology | Admitting: Hematology

## 2021-12-11 ENCOUNTER — Encounter: Payer: Self-pay | Admitting: Hematology

## 2021-12-11 DIAGNOSIS — Z800 Family history of malignant neoplasm of digestive organs: Secondary | ICD-10-CM

## 2021-12-11 DIAGNOSIS — M7989 Other specified soft tissue disorders: Secondary | ICD-10-CM | POA: Diagnosis not present

## 2021-12-11 DIAGNOSIS — I129 Hypertensive chronic kidney disease with stage 1 through stage 4 chronic kidney disease, or unspecified chronic kidney disease: Secondary | ICD-10-CM | POA: Diagnosis not present

## 2021-12-11 DIAGNOSIS — N184 Chronic kidney disease, stage 4 (severe): Secondary | ICD-10-CM | POA: Diagnosis not present

## 2021-12-11 DIAGNOSIS — Z8674 Personal history of sudden cardiac arrest: Secondary | ICD-10-CM | POA: Diagnosis not present

## 2021-12-11 DIAGNOSIS — G4730 Sleep apnea, unspecified: Secondary | ICD-10-CM | POA: Insufficient documentation

## 2021-12-11 DIAGNOSIS — D472 Monoclonal gammopathy: Secondary | ICD-10-CM

## 2021-12-11 DIAGNOSIS — Z86711 Personal history of pulmonary embolism: Secondary | ICD-10-CM

## 2021-12-11 DIAGNOSIS — D649 Anemia, unspecified: Secondary | ICD-10-CM | POA: Diagnosis not present

## 2021-12-11 DIAGNOSIS — Z7901 Long term (current) use of anticoagulants: Secondary | ICD-10-CM | POA: Diagnosis not present

## 2021-12-11 DIAGNOSIS — K219 Gastro-esophageal reflux disease without esophagitis: Secondary | ICD-10-CM | POA: Insufficient documentation

## 2021-12-11 DIAGNOSIS — N189 Chronic kidney disease, unspecified: Secondary | ICD-10-CM

## 2021-12-11 DIAGNOSIS — E1122 Type 2 diabetes mellitus with diabetic chronic kidney disease: Secondary | ICD-10-CM

## 2021-12-11 DIAGNOSIS — E669 Obesity, unspecified: Secondary | ICD-10-CM | POA: Insufficient documentation

## 2021-12-11 LAB — COMPREHENSIVE METABOLIC PANEL
ALT: 23 U/L (ref 0–44)
AST: 17 U/L (ref 15–41)
Albumin: 4.2 g/dL (ref 3.5–5.0)
Alkaline Phosphatase: 118 U/L (ref 38–126)
Anion gap: 8 (ref 5–15)
BUN: 69 mg/dL — ABNORMAL HIGH (ref 8–23)
CO2: 19 mmol/L — ABNORMAL LOW (ref 22–32)
Calcium: 9.3 mg/dL (ref 8.9–10.3)
Chloride: 107 mmol/L (ref 98–111)
Creatinine, Ser: 2.93 mg/dL — ABNORMAL HIGH (ref 0.44–1.00)
GFR, Estimated: 16 mL/min — ABNORMAL LOW (ref >60)
Glucose, Bld: 154 mg/dL — ABNORMAL HIGH (ref 70–99)
Potassium: 5.5 mmol/L — ABNORMAL HIGH (ref 3.5–5.1)
Sodium: 134 mmol/L — ABNORMAL LOW (ref 135–145)
Total Bilirubin: 0.6 mg/dL (ref 0.3–1.2)
Total Protein: 8.5 g/dL — ABNORMAL HIGH (ref 6.5–8.1)

## 2021-12-11 LAB — CBC WITH DIFFERENTIAL/PLATELET
Abs Immature Granulocytes: 0.02 10*3/uL (ref 0.00–0.07)
Basophils Absolute: 0 10*3/uL (ref 0.0–0.1)
Basophils Relative: 0 %
Eosinophils Absolute: 0.3 10*3/uL (ref 0.0–0.5)
Eosinophils Relative: 3 %
HCT: 37.1 % (ref 36.0–46.0)
Hemoglobin: 11.5 g/dL — ABNORMAL LOW (ref 12.0–15.0)
Immature Granulocytes: 0 %
Lymphocytes Relative: 42 %
Lymphs Abs: 3.5 10*3/uL (ref 0.7–4.0)
MCH: 26.1 pg (ref 26.0–34.0)
MCHC: 31 g/dL (ref 30.0–36.0)
MCV: 84.1 fL (ref 80.0–100.0)
Monocytes Absolute: 0.6 10*3/uL (ref 0.1–1.0)
Monocytes Relative: 8 %
Neutro Abs: 3.9 10*3/uL (ref 1.7–7.7)
Neutrophils Relative %: 47 %
Platelets: 294 10*3/uL (ref 150–400)
RBC: 4.41 MIL/uL (ref 3.87–5.11)
RDW: 13.9 % (ref 11.5–15.5)
WBC: 8.4 10*3/uL (ref 4.0–10.5)
nRBC: 0 % (ref 0.0–0.2)

## 2021-12-11 LAB — LACTATE DEHYDROGENASE: LDH: 151 U/L (ref 98–192)

## 2021-12-11 NOTE — Progress Notes (Signed)
[place] [address] [place] [address]   CLINIC:  Medical Oncology/Hematology  Patient Care Team: Fayrene Helper, MD as PCP - General Carver, Elon Alas, DO as Consulting Physician (Internal Medicine) Derek Jack, MD as Medical Oncologist (Hematology)  CHIEF COMPLAINTS/PURPOSE OF CONSULTATION:  Evaluation for IgG kappa MGUS  HISTORY OF PRESENTING ILLNESS:  Kathleen [name] 71 y.o. female is here because of evaluation for abnormal SPEP, at the request of Dr. Theador Hawthorne.  Labs for work-up of CKD on 11/11/2021 showed hemoglobin 10.8, creatinine 2.06 and calcium 9.5.  SPEP in March 2023 showed 0.3 g of monoclonal protein.  She denies any new onset bone pains.  No infections reported.  She reportedly had an unprovoked pulmonary embolism in June 2021 after COVID shot and was placed on Eliquis.  She was off of Eliquis in July 2022 and has developed a massive pulmonary embolism with cardiac arrest on 01/10/2021.  She has been on Eliquis since then.  Denies any B symptoms.  She lives by herself at home and worked at a Designer, industrial/product prior to retirement.  She is a non-smoker.   MEDICAL HISTORY:  Past Medical History:  Diagnosis Date   Acute respiratory failure with hypoxia ([place]) 10/22/2019   Anemia    Arthritis    Chronic kidney disease    Diabetes mellitus type II    Dysphagia    unspecified    GERD (gastroesophageal reflux disease)    Hyperlipidemia 2000   Hypertension 1995   Insomnia    Low back pain    Obesity    PE (pulmonary thromboembolism) ([facility])    Sleep apnea in adult 11/25/2019   Thyroid nodule 11/25/2019    SURGICAL HISTORY: Past Surgical History:  Procedure Laterality Date   Carpal tunnel release     left    CHOLECYSTECTOMY  2007   DIGIT NAIL REMOVAL  08/2011   KNEE SURGERY  1.20.2012   arthroscopy LEFT knee partial medial meniscectomy   TENOTOMY     2,3,4  left foot    toenail removal     TUBAL LIGATION      SOCIAL  HISTORY: Social History   Socioeconomic History   Marital status: Divorced    Spouse name: Not on file   Number of children: 4   Years of education: Not on file   Highest education level: 11th grade  Occupational History   Occupation: Full time Engineer, petroleum   Tobacco Use   Smoking status: Never   Smokeless tobacco: Never  Vaping Use   Vaping Use: Never used  Substance and Sexual Activity   Alcohol use: No   Drug use: No   Sexual activity: Yes  Other Topics Concern   Not on file  Social History Narrative   Lives with 2nd oldest son   Social Determinants of Health   Financial Resource Strain: Low Risk  (12/04/2021)   Overall Financial Resource Strain (CARDIA)    Difficulty of Paying Living Expenses: Not hard at all  Food Insecurity: No Food Insecurity (12/04/2021)   Hunger Vital Sign    Worried About Running Out of Food in the Last Year: Never true    Ran Out of Food in the Last Year: Never true  Transportation Needs: No Transportation Needs (12/04/2021)   PRAPARE - Hydrologist (Medical): No    Lack of Transportation (Non-Medical): No  Physical Activity: Insufficiently Active (12/04/2021)   Exercise Vital Sign  Days of Exercise per Week: 3 days    Minutes of Exercise per Session: 30 min  Stress: No Stress Concern Present (12/04/2021)   [place]    Feeling of Stress : Not at all  Social Connections: Moderately Isolated (12/04/2021)   Social Connection and Isolation Panel [NHANES]    Frequency of Communication with Friends and Family: Not on file    Frequency of Social Gatherings with Friends and Family: More than three times a week    Attends Religious Services: More than 4 times per year    Active Member of [employer] or Organizations: No    Attends Archivist Meetings: Never    Marital Status: Divorced  Human resources officer Violence: Not At Risk (12/04/2021)   Humiliation,  Afraid, Rape, and Kick questionnaire    Fear of Current or Ex-Partner: No    Emotionally Abused: No    Physically Abused: No    Sexually Abused: No    FAMILY HISTORY: Family History  Problem Relation Age of Onset   Diabetes Mother    Hypertension Mother        MI   Heart disease Mother    Kidney disease Mother 81       dialysis   Diabetes Father    Diabetes Sister    Dementia Sister    Diabetes Brother    Diabetes Brother    Kidney disease Brother    Breast cancer Neg Hx     ALLERGIES:  has No Known Allergies.  MEDICATIONS:  Current Outpatient Medications  Medication Sig Dispense Refill   amLODipine (NORVASC) 10 MG tablet TAKE ONE TABLET BY MOUTH ONCE DAILY FOR BLOOD PRESSURE. 90 tablet 0   aspirin EC 81 MG tablet Take 81 mg by mouth daily. Swallow whole.     cholecalciferol (VITAMIN D) 1000 UNITS tablet Take 1,000 Units by mouth daily.     cloNIDine (CATAPRES) 0.3 MG tablet TAKE (1) TABLET BY MOUTH 3 TIMES DAILY. 90 tablet 0   ELIQUIS 5 MG TABS tablet TAKE (1) TABLET BY MOUTH TWICE DAILY. 60 tablet 0   fluticasone (FLONASE) 50 MCG/ACT nasal spray Place into both nostrils daily.     gabapentin (NEURONTIN) 100 MG capsule Take 1 capsule (100 mg total) by mouth 2 (two) times daily. 60 capsule 5   glipiZIDE (GLUCOTROL XL) 2.5 MG 24 hr tablet TAKE (1) TABLET BY MOUTH ONCE DAILY. 30 tablet 5   glucose blood (ACCU-CHEK GUIDE) test strip USE TO TEST BLOOD SUGAR THREE TIMES DAILY AS DIRECTED      DX E11.65 100 strip 11   hydrochlorothiazide (HYDRODIURIL) 25 MG tablet Take 1 tablet (25 mg total) by mouth daily. 90 tablet 1   LANTUS SOLOSTAR 100 UNIT/ML Solostar Pen INJECT 60 UNITS INTO THE SKIN ONCE DAILY. 15 mL 0   mirtazapine (REMERON) 7.5 MG tablet Take 1 tablet (7.5 mg total) by mouth at bedtime. This is for sleep and for your appetitie 30 tablet 5   montelukast (SINGULAIR) 10 MG tablet TAKE (1) TABLET BY MOUTH AT BEDTIME. 30 tablet 5   olmesartan (BENICAR) 20 MG tablet Take 1  tablet (20 mg total) by mouth daily. 90 tablet 3   rosuvastatin (CRESTOR) 5 MG tablet Take 1 tablet (5 mg total) by mouth daily. 30 tablet 5   No current facility-administered medications for this visit.    REVIEW OF SYSTEMS:   Review of Systems  Respiratory:  Positive for shortness of breath.  All other systems reviewed and are negative.    PHYSICAL EXAMINATION: ECOG PERFORMANCE STATUS: 1 - Symptomatic but completely ambulatory  Vitals:   12/11/21 1032  BP: (!) 143/55  Pulse: 87  Resp: 18  Temp: (!) 97 F (36.1 C)  SpO2: 98%   Filed Weights   12/11/21 1032  Weight: 227 lb (103 kg)   Physical Exam Vitals reviewed.  Constitutional:      Appearance: Normal appearance.  Cardiovascular:     Rate and Rhythm: Normal rate and regular rhythm.     Pulses: Normal pulses.     Heart sounds: Normal heart sounds.  Pulmonary:     Effort: Pulmonary effort is normal.     Breath sounds: Normal breath sounds.  Abdominal:     Palpations: Abdomen is soft. There is no mass.  Neurological:     General: No focal deficit present.     Mental Status: She is alert and oriented to person, place, and time.  Psychiatric:        Mood and Affect: Mood normal.        Behavior: Behavior normal.      LABORATORY DATA:  I have reviewed the data as listed Recent Results (from the past 2160 hour(s))  Hemoglobin A1c     Status: Abnormal   Collection Time: 09/17/21  2:56 PM  Result Value Ref Range   Hgb A1c MFr Bld 6.9 (H) 4.8 - 5.6 %    Comment:          Prediabetes: 5.7 - 6.4          Diabetes: >6.4          Glycemic control for adults with diabetes: <7.0    Est. average glucose Bld gHb Est-mCnc 151 mg/dL  Lipid panel     Status: Abnormal   Collection Time: 09/17/21  2:56 PM  Result Value Ref Range   Cholesterol, Total 91 (L) 100 - 199 mg/dL   Triglycerides 129 0 - 149 mg/dL   HDL 35 (L) >39 mg/dL   VLDL Cholesterol Cal 23 5 - 40 mg/dL   LDL Chol Calc (NIH) 33 0 - 99 mg/dL   Chol/HDL  Ratio 2.6 0.0 - 4.4 ratio    Comment:                                   T. Chol/HDL Ratio                                             Men  Women                               1/2 Avg.Risk  3.4    3.3                                   Avg.Risk  5.0    4.4                                2X Avg.Risk  9.6    7.1  3X Avg.Risk 23.4   11.0   CMP14+EGFR     Status: Abnormal   Collection Time: 09/17/21  2:56 PM  Result Value Ref Range   Glucose 123 (H) 70 - 99 mg/dL   BUN 33 (H) 8 - 27 mg/dL   Creatinine, Ser 1.65 (H) 0.57 - 1.00 mg/dL   eGFR 33 (L) >59 mL/min/1.73   BUN/Creatinine Ratio 20 12 - 28   Sodium 137 134 - 144 mmol/L   Potassium 5.6 (H) 3.5 - 5.2 mmol/L   Chloride 105 96 - 106 mmol/L   CO2 21 20 - 29 mmol/L   Calcium 9.7 8.7 - 10.3 mg/dL   Total Protein 7.5 6.0 - 8.5 g/dL   Albumin 4.2 3.7 - 4.7 g/dL   Globulin, Total 3.3 1.5 - 4.5 g/dL   Albumin/Globulin Ratio 1.3 1.2 - 2.2   Bilirubin Total <0.2 0.0 - 1.2 mg/dL   Alkaline Phosphatase 106 44 - 121 IU/L   AST 27 0 - 40 IU/L   ALT 41 (H) 0 - 32 IU/L  Cologuard     Status: None   Collection Time: 09/24/21  8:30 PM  Result Value Ref Range   COLOGUARD Sample Could Not Be Processed 10 N/A    Comment: The specimen received by the laboratory was not acceptable for testing. The patient will be contacted to initiate a new sample collection.   Cologuard     Status: None   Collection Time: 10/12/21 11:31 AM  Result Value Ref Range   COLOGUARD Negative Negative    Comment:  NEGATIVE TEST RESULT. A negative Cologuard result indicates a low likelihood that a colorectal cancer (CRC) or advanced adenoma (adenomatous polyps with more advanced pre-malignant features)  is present. The chance that a person with a negative Cologuard test has a colorectal cancer is less than 1 in 1500 (negative predictive value >99.9%) or has an  advanced adenoma is less than  5.3% (negative predictive value 94.7%). These data are  based on a prospective cross-sectional study of 10,000 individuals at average risk for colorectal cancer who were screened with both Cologuard and colonoscopy. (Imperiale T. et al, N Engl J Med 2014;370(14):1286-1297) The normal value (reference range) for this assay is negative.  COLOGUARD RE-SCREENING RECOMMENDATION: Periodic colorectal cancer screening is an important part of preventive healthcare for asymptomatic individuals at average risk for colorectal cancer.  Following a negative Cologuard result, the [place] Task Force screening guidelines recommend a Cologuard re-screening interval of 3 years.  References: American Cancer Society Guideline for Colorectal Cancer Screening: https://www.cancer.org/cancer/colon-rectal-cancer/detection-diagnosis-staging/acs-recommendations.html.; Rex DK, Boland CR, Dominitz JK, Colorectal Cancer Screening: Recommendations for Physicians and Patients from the [place] Task Force on Colorectal Cancer Screening , Am J Gastroenterology 2017; 034:9179-1505.  TEST DESCRIPTION: Composite algorithmic analysis of stool DNA-biomarkers with hemoglobin immunoassay.   Quantitative values of individual biomarkers are not reportable and are not associated with individual biomarker result reference ranges. Cologuard is intended for colorectal cancer screening of adults of either sex, 81 years or older, who are at average-risk for colorectal cancer (CRC). Cologuard has been approved for use by the U.S. FDA. The performance of Cologuard was  established in a cross sectional study of average-risk adults aged 51-84. Cologuard performance in patients ages 85 to 62 years was estimated by sub-group analysis of near-age groups. Colonoscopies performed for a positive result may find as the most clinically significant lesion: colorectal cancer [4.0%], advanced adenoma (including sessile serrated polyps greater than or  equal to 1cm diameter) [20%]  or non- advanced adenoma [31%]; or no colorectal neoplasia [45%]. These estimates are derived from a prospective cross-sectional screening study of 10,000 individuals at average risk for colorectal cancer who were screened with both Cologuard and colonoscopy. (Imperiale T. et al, Alison Stalling J Med 2014;370(14):1286-1297.) Cologuard may produce a false negative or false positive result (no colorectal cancer or precancerous polyp present at colonoscopy follow up). A negative Cologuard test result does not guarantee the absence of CRC or advanced adenoma (pre-cancer). The current Cologuard  screening interval is every 3 years. Paramedic and [employer] developer). Cologuard performance data in a 10,000 patient pivotal study using colonoscopy as the reference method can be accessed at the following location: www.exactlabs.com/results. Additional description of the Cologuard test process, warnings and precautions can be found at www.cologuard.com.     RADIOGRAPHIC STUDIES: I have personally reviewed the radiological images as listed and agreed with the findings in the report. No results found.  ASSESSMENT:  IgG kappa MGUS: - Patient seen at the request of Dr. Theador Hawthorne. - SPEP (07/29/2021): 0.3 g.  Immunofixation showed IgG kappa. - She has CKD with creatinine around 2.06.  Calcium was normal.  Cologuard test was negative.  Hemoglobin was 10.8 with normal differential on the white count. - Denies any bone pains or recurrent infections.  No B symptoms.  Social/family history: - She lives at home by herself.  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  Non-smoker. - No family history of blood clots.  Sister died of colon cancer.  Maternal aunt also had colon cancer.   PLAN:  IgG kappa MGUS: - We discussed the normal pathophysiology of monoclonal gammopathy and the spectrum of monoclonal gammopathy ranging from MGUS to multiple  myeloma. - Recommend checking myeloma labs today along with LDH and beta-2 microglobulin. - We will check 24-hour urine for total protein, UPEP, urine immunofixation. - Will order skeletal survey. - RTC 2 to 3 weeks to discuss results.  If everything is stable, she will require follow-up visits once every 6 months with myeloma labs and skeletal survey once a year.  2.  Recurrent unprovoked pulmonary embolism: - Continue Eliquis for lifetime.   All questions were answered. The patient knows to call the clinic with any problems, questions or concerns.  Derek Jack, MD 12/11/21 11:18 AM  [facility] [phone]   I, Thana Ates, am acting as a scribe for Dr. Derek Jack.  I, Derek Jack MD, have reviewed the above documentation for accuracy and completeness, and I agree with the above.

## 2021-12-12 LAB — BETA 2 MICROGLOBULIN, SERUM: Beta-2 Microglobulin: 4.6 mg/L — ABNORMAL HIGH (ref 0.6–2.4)

## 2021-12-13 ENCOUNTER — Telehealth: Payer: Self-pay | Admitting: Family Medicine

## 2021-12-13 NOTE — Telephone Encounter (Signed)
Diagnostic mammogram and [place] was ordered on pt since June, still not done, please follow up and explain to pt importance of having the test as mammogram is NOT normal

## 2021-12-14 ENCOUNTER — Ambulatory Visit (HOSPITAL_COMMUNITY)
Admission: RE | Admit: 2021-12-14 | Discharge: 2021-12-14 | Disposition: A | Discharge status: Home / Self Care | Payer: No Typology Code available for payment source | Source: Ambulatory Visit | Attending: Hematology | Admitting: Hematology

## 2021-12-14 ENCOUNTER — Other Ambulatory Visit: Payer: Self-pay

## 2021-12-14 DIAGNOSIS — D472 Monoclonal gammopathy: Secondary | ICD-10-CM | POA: Insufficient documentation

## 2021-12-14 LAB — KAPPA/LAMBDA LIGHT CHAINS
Kappa free light chain: 49.6 mg/L — ABNORMAL HIGH (ref 3.3–19.4)
Kappa, lambda light chain ratio: 1.78 — ABNORMAL HIGH (ref 0.26–1.65)
Lambda free light chains: 27.8 mg/L — ABNORMAL HIGH (ref 5.7–26.3)

## 2021-12-14 LAB — PROTEIN ELECTROPHORESIS, SERUM
A/G Ratio: 0.9 (ref 0.7–1.7)
Albumin ELP: 3.7 g/dL (ref 2.9–4.4)
Alpha-1-Globulin: 0.2 g/dL (ref 0.0–0.4)
Alpha-2-Globulin: 0.8 g/dL (ref 0.4–1.0)
Beta Globulin: 1.4 g/dL — ABNORMAL HIGH (ref 0.7–1.3)
Gamma Globulin: 1.6 g/dL (ref 0.4–1.8)
Globulin, Total: 4 g/dL — ABNORMAL HIGH (ref 2.2–3.9)
M-Spike, %: 0.5 g/dL — ABNORMAL HIGH
Total Protein ELP: 7.7 g/dL (ref 6.0–8.5)

## 2021-12-14 LAB — IMMUNOFIXATION ELECTROPHORESIS
IgA: 444 mg/dL — ABNORMAL HIGH (ref 64–422)
IgG (Immunoglobin G), Serum: 1624 mg/dL — ABNORMAL HIGH (ref 586–1602)
IgM (Immunoglobulin M), Srm: 63 mg/dL (ref 26–217)
Total Protein ELP: 7.9 g/dL (ref 6.0–8.5)

## 2021-12-14 NOTE — Telephone Encounter (Signed)
Let patient know that this was important she got upset and stated they did this before and it was fine that she was not going back again. She had too many other appts right now and she just was not going to do this at this time. Advised it was important and when she gets things taken care of to let us know we would be happy to set this up for her

## 2021-12-16 LAB — UPEP/UIFE/LIGHT CHAINS/TP, 24-HR UR
% BETA, Urine: 0 %
ALPHA 1 URINE: 0 %
Albumin, U: 100 %
Alpha 2, Urine: 0 %
Free Kappa Lt Chains,Ur: 39.2 mg/L (ref 1.17–86.46)
Free Kappa/Lambda Ratio: 4.01 (ref 1.83–14.26)
Free Lambda Lt Chains,Ur: 9.77 mg/L (ref 0.27–15.21)
GAMMA GLOBULIN URINE: 0 %
Total Protein, Urine-Ur/day: 103 mg/(24.h) (ref 30–150)
Total Protein, Urine: 15.9 mg/dL
Total Volume: 650

## 2021-12-17 ENCOUNTER — Telehealth: Payer: Self-pay

## 2021-12-17 NOTE — Telephone Encounter (Signed)
Sam called from [facility] asking if patient should be taking this medication, patient says should not be taking this medication rosuvastatin (CRESTOR) 5 MG tablet  Sam call back # [phone] ext [phone]

## 2021-12-19 ENCOUNTER — Other Ambulatory Visit: Payer: Self-pay | Admitting: Family Medicine

## 2021-12-22 ENCOUNTER — Telehealth: Payer: Self-pay | Admitting: Family Medicine

## 2021-12-22 NOTE — Telephone Encounter (Signed)
Patient needs a call back in regard to   rosuvastatin (CRESTOR) 5 MG tablet   Patient thinks med is DC

## 2021-12-23 NOTE — Telephone Encounter (Signed)
Pt states she did not call about med.

## 2021-12-24 DIAGNOSIS — N189 Chronic kidney disease, unspecified: Secondary | ICD-10-CM | POA: Diagnosis not present

## 2021-12-24 DIAGNOSIS — I5033 Acute on chronic diastolic (congestive) heart failure: Secondary | ICD-10-CM | POA: Diagnosis not present

## 2021-12-24 DIAGNOSIS — D472 Monoclonal gammopathy: Secondary | ICD-10-CM | POA: Diagnosis not present

## 2021-12-24 DIAGNOSIS — I129 Hypertensive chronic kidney disease with stage 1 through stage 4 chronic kidney disease, or unspecified chronic kidney disease: Secondary | ICD-10-CM | POA: Diagnosis not present

## 2021-12-24 DIAGNOSIS — D638 Anemia in other chronic diseases classified elsewhere: Secondary | ICD-10-CM | POA: Diagnosis not present

## 2021-12-24 DIAGNOSIS — E1122 Type 2 diabetes mellitus with diabetic chronic kidney disease: Secondary | ICD-10-CM | POA: Diagnosis not present

## 2021-12-30 DIAGNOSIS — I129 Hypertensive chronic kidney disease with stage 1 through stage 4 chronic kidney disease, or unspecified chronic kidney disease: Secondary | ICD-10-CM | POA: Diagnosis not present

## 2021-12-30 DIAGNOSIS — D472 Monoclonal gammopathy: Secondary | ICD-10-CM | POA: Diagnosis not present

## 2021-12-30 DIAGNOSIS — D638 Anemia in other chronic diseases classified elsewhere: Secondary | ICD-10-CM | POA: Diagnosis not present

## 2021-12-30 DIAGNOSIS — N170 Acute kidney failure with tubular necrosis: Secondary | ICD-10-CM | POA: Diagnosis not present

## 2021-12-30 DIAGNOSIS — I5032 Chronic diastolic (congestive) heart failure: Secondary | ICD-10-CM | POA: Diagnosis not present

## 2021-12-30 DIAGNOSIS — N189 Chronic kidney disease, unspecified: Secondary | ICD-10-CM | POA: Diagnosis not present

## 2021-12-30 DIAGNOSIS — E1122 Type 2 diabetes mellitus with diabetic chronic kidney disease: Secondary | ICD-10-CM | POA: Diagnosis not present

## 2021-12-30 NOTE — Progress Notes (Deleted)
NO SHOW

## 2021-12-31 ENCOUNTER — Inpatient Hospital Stay: Payer: No Typology Code available for payment source | Admitting: Physician Assistant

## 2022-01-06 ENCOUNTER — Other Ambulatory Visit: Payer: Self-pay | Admitting: Family Medicine

## 2022-01-06 NOTE — Progress Notes (Unsigned)
[place], [place] [address]   CLINIC:  Medical Oncology/Hematology  PCP:  Fayrene Helper, MD [address], [address] [place] [address] [phone]   REASON FOR VISIT:  Follow-up for IgG kappa MGUS  PRIOR THERAPY: None  CURRENT THERAPY: Under work-up  INTERVAL HISTORY:  Ms. [place] 71 y.o. female returns for routine follow-up of her IgG kappa MGUS.  She was seen for initial consultation by Dr. Delton Coombes on 12/11/2021.  At today's visit, she reports feeling fair.  She denies any hospitalizations, new symptoms, or changes in her baseline health status since her visit with Dr. Delton Coombes 3 weeks ago.    She denies any new bone pain or recent fractures.  She denies any B symptoms such as fever, chills, night sweats, unintentional weight loss..  No new neurologic symptoms such as tinnitus, new-onset hearing loss, blurred vision, headache, or dizziness.  Denies any numbness or tingling in hands or feet.  No thromboembolic events since her last visit.   No new masses or lymphadenopathy per her report.  She continues to take Eliquis due to her history of unprovoked PE (June 2021) and recurrent sieve PE (July 2022).   She denies any major bleeding events or current symptoms of recurrent DVT/PE.    She has 70% energy and 100% appetite. She endorses that she is maintaining a stable weight.   REVIEW OF SYSTEMS:  Review of Systems  Constitutional:  Negative for appetite change, chills, diaphoresis, fatigue, fever and unexpected weight change.  HENT:   Negative for lump/mass and nosebleeds.   Eyes:  Negative for eye problems.  Respiratory:  Negative for cough, hemoptysis and shortness of breath.   Cardiovascular:  Negative for chest pain, leg swelling and palpitations.  Gastrointestinal:  Negative for abdominal pain, blood in stool, constipation, diarrhea, nausea and vomiting.  Genitourinary:  Negative for hematuria.   Musculoskeletal:   Positive for arthralgias (Left hand swelling and pain).  Skin: Negative.   Neurological:  Negative for dizziness, headaches and light-headedness.  Hematological:  Does not bruise/bleed easily.      PAST MEDICAL/SURGICAL HISTORY:  Past Medical History:  Diagnosis Date   Acute respiratory failure with hypoxia ([place]) 10/22/2019   Anemia    Arthritis    Chronic kidney disease    Diabetes mellitus type II    Dysphagia    unspecified    GERD (gastroesophageal reflux disease)    Hyperlipidemia 2000   Hypertension 1995   Insomnia    Low back pain    Obesity    PE (pulmonary thromboembolism) ([place])    Sleep apnea in adult 11/25/2019   Thyroid nodule 11/25/2019   Past Surgical History:  Procedure Laterality Date   Carpal tunnel release     left    CHOLECYSTECTOMY  2007   DIGIT NAIL REMOVAL  08/2011   KNEE SURGERY  1.20.2012   arthroscopy LEFT knee partial medial meniscectomy   TENOTOMY     2,3,4  left foot    toenail removal     TUBAL LIGATION       SOCIAL HISTORY:  Social History   Socioeconomic History   Marital status: Divorced    Spouse name: Not on file   Number of children: 4   Years of education: Not on file   Highest education level: 11th grade  Occupational History   Occupation: Full time Engineer, petroleum   Tobacco Use   Smoking status: Never   Smokeless tobacco:  Never  Vaping Use   Vaping Use: Never used  Substance and Sexual Activity   Alcohol use: No   Drug use: No   Sexual activity: Yes  Other Topics Concern   Not on file  Social History Narrative   Lives with 2nd oldest son   Social Determinants of Health   Financial Resource Strain: Low Risk  (12/04/2021)   Overall Financial Resource Strain (CARDIA)    Difficulty of Paying Living Expenses: Not hard at all  Food Insecurity: No Food Insecurity (12/04/2021)   Hunger Vital Sign    Worried About Running Out of Food in the Last Year: Never true    Ran Out of Food in the Last Year: Never true   Transportation Needs: No Transportation Needs (12/04/2021)   PRAPARE - Hydrologist (Medical): No    Lack of Transportation (Non-Medical): No  Physical Activity: Insufficiently Active (12/04/2021)   Exercise Vital Sign    Days of Exercise per Week: 3 days    Minutes of Exercise per Session: 30 min  Stress: No Stress Concern Present (12/04/2021)   [place]    Feeling of Stress : Not at all  Social Connections: Moderately Isolated (12/04/2021)   Social Connection and Isolation Panel [NHANES]    Frequency of Communication with Friends and Family: Not on file    Frequency of Social Gatherings with Friends and Family: More than three times a week    Attends Religious Services: More than 4 times per year    Active Member of [employer] or Organizations: No    Attends Archivist Meetings: Never    Marital Status: Divorced  Human resources officer Violence: Not At Risk (12/04/2021)   Humiliation, Afraid, Rape, and Kick questionnaire    Fear of Current or Ex-Partner: No    Emotionally Abused: No    Physically Abused: No    Sexually Abused: No    FAMILY HISTORY:  Family History  Problem Relation Age of Onset   Diabetes Mother    Hypertension Mother        MI   Heart disease Mother    Kidney disease Mother 70       dialysis   Diabetes Father    Diabetes Sister    Dementia Sister    Diabetes Brother    Diabetes Brother    Kidney disease Brother    Breast cancer Neg Hx     CURRENT MEDICATIONS:  Outpatient Encounter Medications as of 01/07/2022  Medication Sig Note   amLODipine (NORVASC) 10 MG tablet TAKE ONE TABLET BY MOUTH ONCE DAILY FOR BLOOD PRESSURE.    aspirin EC 81 MG tablet Take 81 mg by mouth daily. Swallow whole.    cholecalciferol (VITAMIN D) 1000 UNITS tablet Take 1,000 Units by mouth daily. 03/26/2021: Pt taking 2000 iu   cloNIDine (CATAPRES) 0.3 MG tablet TAKE (1) TABLET BY  MOUTH 3 TIMES DAILY.    ELIQUIS 5 MG TABS tablet TAKE (1) TABLET BY MOUTH TWICE DAILY.    fluticasone (FLONASE) 50 MCG/ACT nasal spray Place into both nostrils daily.    gabapentin (NEURONTIN) 100 MG capsule Take 1 capsule (100 mg total) by mouth 2 (two) times daily.    glipiZIDE (GLUCOTROL XL) 2.5 MG 24 hr tablet TAKE (1) TABLET BY MOUTH ONCE DAILY.    glucose blood (ACCU-CHEK GUIDE) test strip USE TO TEST BLOOD SUGAR THREE TIMES DAILY AS DIRECTED  DX E11.65    hydrochlorothiazide (HYDRODIURIL) 25 MG tablet Take 1 tablet (25 mg total) by mouth daily.    LANTUS SOLOSTAR 100 UNIT/ML Solostar Pen INJECT 60 UNITS INTO THE SKIN ONCE DAILY.    mirtazapine (REMERON) 7.5 MG tablet Take 1 tablet (7.5 mg total) by mouth at bedtime. This is for sleep and for your appetitie    montelukast (SINGULAIR) 10 MG tablet TAKE (1) TABLET BY MOUTH AT BEDTIME.    olmesartan (BENICAR) 20 MG tablet Take 1 tablet (20 mg total) by mouth daily.    rosuvastatin (CRESTOR) 5 MG tablet Take 1 tablet (5 mg total) by mouth daily.    [DISCONTINUED] sitaGLIPtan (JANUVIA) 100 MG tablet Take 100 mg by mouth daily.      No facility-administered encounter medications on file as of 01/07/2022.    ALLERGIES:  No Known Allergies   PHYSICAL EXAM:  ECOG PERFORMANCE STATUS: 1 - Symptomatic but completely ambulatory  There were no vitals filed for this visit. There were no vitals filed for this visit. Physical Exam Constitutional:      Appearance: Normal appearance. She is obese.  HENT:     Head: Normocephalic and atraumatic.     Mouth/Throat:     Mouth: Mucous membranes are moist.  Eyes:     Extraocular Movements: Extraocular movements intact.     Pupils: Pupils are equal, round, and reactive to light.  Cardiovascular:     Rate and Rhythm: Normal rate and regular rhythm.     Pulses: Normal pulses.     Heart sounds: Normal heart sounds.  Pulmonary:     Effort: Pulmonary effort is normal.     Breath sounds: Normal  breath sounds.  Abdominal:     General: Bowel sounds are normal.     Palpations: Abdomen is soft.     Tenderness: There is no abdominal tenderness.  Musculoskeletal:        General: Swelling (Edema of left hand, particularly left ring finger; flexion of left ring finger with pain on passive extension; left hand is warm to touch) present.     Right lower leg: No edema.     Left lower leg: No edema.  Lymphadenopathy:     Cervical: No cervical adenopathy.  Skin:    General: Skin is warm and dry.  Neurological:     General: No focal deficit present.     Mental Status: She is alert and oriented to person, place, and time.  Psychiatric:        Mood and Affect: Mood normal.        Behavior: Behavior normal.      LABORATORY DATA:  I have reviewed the labs as listed.  CBC    Component Value Date/Time   WBC 8.4 12/11/2021 1108   RBC 4.41 12/11/2021 1108   HGB 11.5 (L) 12/11/2021 1108   HGB 10.8 (L) 03/26/2021 1201   HCT 37.1 12/11/2021 1108   HCT 35.2 03/26/2021 1201   PLT 294 12/11/2021 1108   PLT 355 03/26/2021 1201   MCV 84.1 12/11/2021 1108   MCV 87 03/26/2021 1201   MCH 26.1 12/11/2021 1108   MCHC 31.0 12/11/2021 1108   RDW 13.9 12/11/2021 1108   RDW 13.0 03/26/2021 1201   LYMPHSABS 3.5 12/11/2021 1108   MONOABS 0.6 12/11/2021 1108   EOSABS 0.3 12/11/2021 1108   BASOSABS 0.0 12/11/2021 1108      Latest Ref Rng & Units 12/11/2021   11:08 AM 09/17/2021    2:56  PM 05/15/2021    2:25 AM  CMP  Glucose 70 - 99 mg/dL 154  123  123   BUN 8 - 23 mg/dL 69  33  31   Creatinine 0.44 - 1.00 mg/dL 2.93  1.65  1.76   Sodium 135 - 145 mmol/L 134  137  135   Potassium 3.5 - 5.1 mmol/L 5.5  5.6  4.9   Chloride 98 - 111 mmol/L 107  105  105   CO2 22 - 32 mmol/L 19  21  24    Calcium 8.9 - 10.3 mg/dL 9.3  9.7  8.8   Total Protein 6.5 - 8.1 g/dL 8.5  7.5  7.1   Total Bilirubin 0.3 - 1.2 mg/dL 0.6  <0.2  0.4   Alkaline Phos 38 - 126 U/L 118  106  86   AST 15 - 41 U/L 17  27  24    ALT  0 - 44 U/L 23  41  31     DIAGNOSTIC IMAGING:  I have independently reviewed the relevant imaging and discussed with the patient.   ASSESSMENT & PLAN: 1.  IgG kappa MGUS: - Patient seen at the request of Dr. Theador Hawthorne. - SPEP (07/29/2021): 0.3 g.  Immunofixation showed IgG kappa. - She has CKD with creatinine around 2.06.  Calcium was normal.  Cologuard test was negative.  Hemoglobin was 10.8 with normal differential on the white count. - Hematology work-up (12/11/2021): Immunofixation: IgG kappa. SPEP: M spike 0.5. Free light chains: Elevated kappa 49.6, elevated lambda 27.8, and ratio elevated 1.78. Elevated beta-2 microglobulin 4.6 with normal LDH 151. 24-hour urine unremarkable - CMP from 12/11/2021 showed creatinine 2.93, elevated above baseline CKD stage IV secondary to diabetic nephropathy - nephrologist held omesartan and spironolactone, and creatinine returned to baseline at around 2.0 (nephrologist aware of increased creatinine, per note from nephrology office on 12/16/2021). --   Calcium 9.3.  Hgb 11.5, at baseline with suspected anemia of CKD. - Skeletal survey (12/14/2021): No focal lytic lesions - Denies any bone pains or recurrent infections.  No B symptoms.   - We have discussed the normal pathophysiology of monoclonal gammopathy and the spectrum of monoclonal gammopathy ranging from MGUS to multiple myeloma. - PLAN: Repeat MGUS/myeloma labs in 6 months.  Annual skeletal survey due August 2024.  Continue close follow-up with nephrologist.  2.  Recurrent unprovoked pulmonary embolism - Unprovoked pulmonary embolism in June 2021 after COVID shot and was placed on Eliquis.  She was taken off of Eliquis in July 2022 after hematology consultation with Dr. Chryl Heck.  Unfortunately, she developed a massive pulmonary embolism with cardiac arrest on 01/10/2021.  She has been on Eliquis since then. - PLAN: Continue indefinite Eliquis  3.  Normocytic anemia - Baseline Hgb 10.0-12.0, with MCV in  the 80s.  Suspected to be anemia in the setting of CKD stage IV and functional iron deficiency. - Most recent CBC (12/24/2021): Hgb 11.2/MCV 81.8. - Recent labs by nephrologist show ferritin 71 with iron saturation 17% - Denies any bleeding episodes.   - PLAN: Hemoglobin currently at goal.  At next visit, we will also check nutritional panel and basic anemia labs.     4.  Left hand swelling - Patient reports left hand swelling for the past week - Reports sore on her left ring finger with onset on 11/10/2021 - Left ring finger has been flexed since that time, with increasing edema for the past week - On exam she has flexion of left  ring finger with severe pain on passive extension; edema of all five digits with left hand edema and warmth.  No purulent drainage noted, but she has small scab at site of previous sore. - PLAN: Due to concern for possible flexor tenosynovitis, I discussed with patient's PCP.  Appointment has been made for patient to have urgent evaluation by Dr. Moshe Cipro tomorrow morning.  5.  Other history - PMH: CKD stage IV, diabetes, GERD, hypertension, sleep apnea, pulmonary embolism - She lives at home by herself.  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  Non-smoker. - No family history of blood clots.  Sister died of colon cancer.  Maternal aunt also had colon cancer.   PLAN SUMMARY & DISPOSITION: Labs in 6 months Office visit 1 week after labs  All questions were answered. The patient knows to call the clinic with any problems, questions or concerns.  Medical decision making: Moderate  Time spent on visit: I spent 25 minutes counseling the patient face to face. The total time spent in the appointment was 40 minutes and more than 50% was on counseling.   Harriett Rush, PA-C  01/07/2022 9:20 AM

## 2022-01-07 ENCOUNTER — Inpatient Hospital Stay (HOSPITAL_BASED_OUTPATIENT_CLINIC_OR_DEPARTMENT_OTHER): Payer: No Typology Code available for payment source | Admitting: Physician Assistant

## 2022-01-07 VITALS — BP 141/68 | HR 102 | Temp 98.7°F | Resp 18 | Ht 66.0 in | Wt 225.5 lb

## 2022-01-07 DIAGNOSIS — D649 Anemia, unspecified: Secondary | ICD-10-CM | POA: Diagnosis not present

## 2022-01-07 DIAGNOSIS — I2699 Other pulmonary embolism without acute cor pulmonale: Secondary | ICD-10-CM

## 2022-01-07 DIAGNOSIS — D472 Monoclonal gammopathy: Secondary | ICD-10-CM

## 2022-01-07 NOTE — Patient Instructions (Signed)
[facility] **   You were seen today by Tarri Abernethy PA-C for your follow-up visit.    MGUS This is a precancerous condition where you have abnormal protein in your blood.  It is not causing any problems right now, but has a 1% chance each year of becoming myeloma cancer. We will check these labs again in 6 months and see you for follow-up visit.  ANEMIA Your anemia is most likely related to your chronic kidney disease. Your anemia is mild and does not need any treatment at this time. We will check on your anemia again at your follow-up visit in 6 months.  **Please see the attached handout for important information regarding your health conditions.  FOLLOW-UP APPOINTMENT: Labs in 6 months with office visit 1 week after  ** Thank you for trusting me with your healthcare!  I strive to provide all of my patients with quality care at each visit.  If you receive a survey for this visit, I would be so grateful to you for taking the time to provide feedback.  Thank you in advance!  ~ Kayley Zeiders                   Dr. Derek Jack   &   Tarri Abernethy, PA-C   - - - - - - - - - - - - - - - - - -    Thank you for choosing [place] [facility] [place] [facility] to provide your oncology and hematology care.  To afford each patient quality time with our provider, please arrive at least 15 minutes before your scheduled appointment time.   If you have a lab appointment with the [facility] please come in thru the Main Entrance and check in at the main information desk.  You need to re-schedule your appointment should you arrive 10 or more minutes late.  We strive to give you quality time with our providers, and arriving late affects you and other patients whose appointments are after yours.  Also, if you no show three or more times for appointments you may be dismissed from the clinic at the providers  discretion.     Again, thank you for choosing [facility].  Our hope is that these requests will decrease the amount of time that you wait before being seen by our physicians.       _____________________________________________________________  Should you have questions after your visit to [facility], please contact our office at [phone] and follow the prompts.  Our office hours are 8:00 a.m. and 4:30 p.m. Monday - Friday.  Please note that voicemails left after 4:00 p.m. may not be returned until the following business day.  We are closed weekends and major holidays.  You do have access to a nurse 24-7, just call the main number to the clinic [phone] and do not press any options, hold on the line and a nurse will answer the phone.    For prescription refill requests, have your pharmacy contact our office and allow 72 hours.

## 2022-01-08 ENCOUNTER — Ambulatory Visit (INDEPENDENT_AMBULATORY_CARE_PROVIDER_SITE_OTHER): Payer: No Typology Code available for payment source | Admitting: Family Medicine

## 2022-01-08 ENCOUNTER — Encounter: Payer: Self-pay | Admitting: Family Medicine

## 2022-01-08 VITALS — BP 164/68 | HR 117 | Ht 66.0 in | Wt 227.0 lb

## 2022-01-08 DIAGNOSIS — E041 Nontoxic single thyroid nodule: Secondary | ICD-10-CM

## 2022-01-08 DIAGNOSIS — E7849 Other hyperlipidemia: Secondary | ICD-10-CM | POA: Diagnosis not present

## 2022-01-08 DIAGNOSIS — E1169 Type 2 diabetes mellitus with other specified complication: Secondary | ICD-10-CM | POA: Diagnosis not present

## 2022-01-08 DIAGNOSIS — Z794 Long term (current) use of insulin: Secondary | ICD-10-CM | POA: Diagnosis not present

## 2022-01-08 DIAGNOSIS — M79642 Pain in left hand: Secondary | ICD-10-CM | POA: Diagnosis not present

## 2022-01-08 DIAGNOSIS — E790 Hyperuricemia without signs of inflammatory arthritis and tophaceous disease: Secondary | ICD-10-CM

## 2022-01-08 DIAGNOSIS — E118 Type 2 diabetes mellitus with unspecified complications: Secondary | ICD-10-CM

## 2022-01-08 DIAGNOSIS — I10 Essential (primary) hypertension: Secondary | ICD-10-CM

## 2022-01-08 DIAGNOSIS — D472 Monoclonal gammopathy: Secondary | ICD-10-CM

## 2022-01-08 MED ORDER — PREDNISONE 10 MG PO TABS: 10.0000 mg | ORAL_TABLET | Freq: Two times a day (BID) | ORAL | 0 refills | Status: DC

## 2022-01-08 MED ORDER — CLONIDINE 0.3 MG/24HR TD PTWK: 0.3000 mg | MEDICATED_PATCH | TRANSDERMAL | 12 refills | Status: DC

## 2022-01-08 NOTE — Patient Instructions (Addendum)
F/U AS BEFORE, CALL IF YOU NEED ME SOONER, FLU VACCINE AT VISIT  5 DAY COURSE OF PREDNISONE IS PRESCRIBED FOR PAIN AND SWELLING OF LEFT HAND, IF PERSISTS PLEASE CALL BACK FOR REFERRAL BACK TO ORTHOPEDICS  NEW ADDITIONAL MEDICATION, CATAPRESS PATCH (CLONIDINE ) IS PRESCRIBED FOR BLOOD PRESSURE. CONTINUE ALL MEDICATIONS YOU ARE CURRENTLY TAKING  LABS TODAY, URIC ACID, LIPID PANEL, HBA1C AND TSH  PLEASE KEEP APPOINTMENTS FOR MAMMOGRAM, [place] OF BREAST AND THYROID WHICH WILL BE SCHEDULED  Thanks for choosing [facility], we consider it a privelige to serve you. I

## 2022-01-09 LAB — LIPID PANEL
Chol/HDL Ratio: 4.9 ratio — ABNORMAL HIGH (ref 0.0–4.4)
Cholesterol, Total: 207 mg/dL — ABNORMAL HIGH (ref 100–199)
HDL: 42 mg/dL (ref >39)
LDL Chol Calc (NIH): 143 mg/dL — ABNORMAL HIGH (ref 0–99)
Triglycerides: 122 mg/dL (ref 0–149)
VLDL Cholesterol Cal: 22 mg/dL (ref 5–40)

## 2022-01-09 LAB — TSH: TSH: 1.41 u[IU]/mL (ref 0.450–4.500)

## 2022-01-09 LAB — URIC ACID: Uric Acid: 9.9 mg/dL — ABNORMAL HIGH (ref 3.1–7.9)

## 2022-01-09 LAB — HEMOGLOBIN A1C
Est. average glucose Bld gHb Est-mCnc: 235 mg/dL
Hgb A1c MFr Bld: 9.8 % — ABNORMAL HIGH (ref 4.8–5.6)

## 2022-01-11 ENCOUNTER — Encounter: Payer: Self-pay | Admitting: Family Medicine

## 2022-01-11 ENCOUNTER — Other Ambulatory Visit: Payer: Self-pay | Admitting: Family Medicine

## 2022-01-11 DIAGNOSIS — E790 Hyperuricemia without signs of inflammatory arthritis and tophaceous disease: Secondary | ICD-10-CM | POA: Insufficient documentation

## 2022-01-11 NOTE — Assessment & Plan Note (Addendum)
Uncontrolled Needs to take medication as prescribed , and catapres patch prescribed DASH diet and commitment to daily physical activity for a minimum of 30 minutes discussed and encouraged, as a part of hypertension management. The importance of attaining a healthy weight is also discussed.     01/08/2022    9:28 AM 01/08/2022    9:27 AM 01/07/2022    8:39 AM 12/11/2021   10:32 AM 09/17/2021    2:31 PM 09/17/2021    1:46 PM 06/05/2021    2:17 PM  BP/Weight  Systolic BP 844 652 076 191 550 271 423  Diastolic BP 68 78 68 55 70 75 72  Wt. (Lbs)  227 225.5 227  229.8   BMI  36.64 kg/m2 36.4 kg/m2 36.64 kg/m2  37.09 kg/m2

## 2022-01-11 NOTE — Assessment & Plan Note (Signed)
Recurrent pain and swelling, short course of prednisone prescribed, if not improved Ortho to re eval

## 2022-01-11 NOTE — Assessment & Plan Note (Addendum)
Kathleen [name] is reminded of the importance of commitment to daily physical activity for 30 minutes or more, as able and the need to limit carbohydrate intake to 30 to 60 grams per meal to help with blood sugar control.   The need to take medication as prescribed, test blood sugar as directed, and to call between visits if there is a concern that blood sugar is uncontrolled is also discussed.   Kathleen [name] is reminded of the importance of daily foot exam, annual eye examination, and good blood sugar, blood pressure and cholesterol control. Uncontrolled, med and dietary adjustment needed, needs endo management, has resisted n the past, wil re address     Latest Ref Rng & Units 01/08/2022   10:30 AM 12/11/2021   11:08 AM 09/17/2021    2:56 PM 05/15/2021    2:25 AM 05/14/2021    9:27 PM  Diabetic Labs  HbA1c 4.8 - 5.6 % 9.8   6.9   7.6   Chol 100 - 199 mg/dL 207   91     HDL >39 mg/dL 42   35     Calc LDL 0 - 99 mg/dL 143   33     Triglycerides 0 - 149 mg/dL 122   129     Creatinine 0.44 - 1.00 mg/dL  2.93  1.65  1.76        01/08/2022    9:28 AM 01/08/2022    9:27 AM 01/07/2022    8:39 AM 12/11/2021   10:32 AM 09/17/2021    2:31 PM 09/17/2021    1:46 PM 06/05/2021    2:17 PM  BP/Weight  Systolic BP 300 511 021 117 356 701 410  Diastolic BP 68 78 68 55 70 75 72  Wt. (Lbs)  227 225.5 227  229.8   BMI  36.64 kg/m2 36.4 kg/m2 36.64 kg/m2  37.09 kg/m2       Latest Ref Rng & Units 09/17/2021    1:40 PM 07/15/2021   12:00 AM  Foot/eye exam completion dates  Eye Exam No Retinopathy  No Retinopathy      Foot Form Completion  Done      This result is from an external source.   uncontrolled

## 2022-01-11 NOTE — Assessment & Plan Note (Signed)
  Patient re-educated about  the importance of commitment to a  minimum of 150 minutes of exercise per week as able.  The importance of healthy food choices with portion control discussed, as well as eating regularly and within a 12 hour window most days. The need to choose "clean , green" food 50 to 75% of the time is discussed, as well as to make water the primary drink and set a goal of 64 ounces water daily.       01/08/2022    9:27 AM 01/07/2022    8:39 AM 12/11/2021   10:32 AM  Weight /BMI  Weight 227 lb 225 lb 8 oz 227 lb  Height '5\' 6"'$  (1.676 m) '5\' 6"'$  (1.676 m) '5\' 6"'$  (1.676 m)  BMI 36.64 kg/m2 36.4 kg/m2 36.64 kg/m2

## 2022-01-11 NOTE — Assessment & Plan Note (Signed)
Would  Benefit from medication to lower uric acid level, has severe  Kidney disease

## 2022-01-11 NOTE — Progress Notes (Signed)
Kathleen [name]     MRN: [id number]      DOB: 1950/06/04   HPI Kathleen [name] is here for follow up and re-evaluation of chronic medical conditions, medication management and review of any available recent lab and radiology data.  Preventive health is updated, specifically  Cancer screening and Immunization.   Questions or concerns regarding consultations or procedures which the PT has had in the interim are  addressed. The PT denies any adverse reactions to current medications since the last visit.  Left hand swelling and pain for past 2 weeks, no trauma   ROS Denies recent fever or chills. Denies sinus pressure, nasal congestion, ear pain or sore throat. Denies chest congestion, productive cough or wheezing. Denies chest pains, palpitations and leg swelling Denies abdominal pain, nausea, vomiting,diarrhea or constipation.   Denies dysuria, frequency, hesitancy or incontinence.  Denies headaches, seizures, numbness, or tingling. Denies depression, anxiety or insomnia. Denies skin break down or rash.   PE  BP (!) 164/68 (BP Location: Right Arm, Patient Position: Sitting, Cuff Size: Large)   Pulse (!) 117   Ht '5\' 6"'$  (1.676 m)   Wt 227 lb (103 kg)   SpO2 92%   BMI 36.64 kg/m   Patient alert and oriented and in no cardiopulmonary distress.  HEENT: No facial asymmetry, EOMI,     Neck supple .  Chest: Clear to auscultation bilaterally.  CVS: S1, S2 no murmurs, no S3.Regular rate.  ABD: Soft non tender.   Ext: No edema Though reduced ROM spine, shoulders, hips and knees.Swelling and tenderness of right hand  Skin: Intact, no ulcerations or rash noted.  Psych: Good eye contact, normal affect. Memory intact not anxious or depressed appearing.  CNS: CN 2-12 intact, power,  normal throughout.no focal deficits noted.   Assessment & Plan  Essential hypertension Uncontrolled Needs to take medication as prescribed , and catapres patch prescribed DASH diet and commitment to  daily physical activity for a minimum of 30 minutes discussed and encouraged, as a part of hypertension management. The importance of attaining a healthy weight is also discussed.     01/08/2022    9:28 AM 01/08/2022    9:27 AM 01/07/2022    8:39 AM 12/11/2021   10:32 AM 09/17/2021    2:31 PM 09/17/2021    1:46 PM 06/05/2021    2:17 PM  BP/Weight  Systolic BP 962 836 629 476 546 503 546  Diastolic BP 68 78 68 55 70 75 72  Wt. (Lbs)  227 225.5 227  229.8   BMI  36.64 kg/m2 36.4 kg/m2 36.64 kg/m2  37.09 kg/m2        Type 2 diabetes mellitus ([place]) Kathleen [name] is reminded of the importance of commitment to daily physical activity for 30 minutes or more, as able and the need to limit carbohydrate intake to 30 to 60 grams per meal to help with blood sugar control.   The need to take medication as prescribed, test blood sugar as directed, and to call between visits if there is a concern that blood sugar is uncontrolled is also discussed.   Kathleen [name] is reminded of the importance of daily foot exam, annual eye examination, and good blood sugar, blood pressure and cholesterol control. Uncontrolled, med and dietary adjustment needed, needs endo management, has resisted n the past, wil re address     Latest Ref Rng & Units 01/08/2022   10:30 AM 12/11/2021   11:08 AM 09/17/2021    2:56  PM 05/15/2021    2:25 AM 05/14/2021    9:27 PM  Diabetic Labs  HbA1c 4.8 - 5.6 % 9.8   6.9   7.6   Chol 100 - 199 mg/dL 207   91     HDL >39 mg/dL 42   35     Calc LDL 0 - 99 mg/dL 143   33     Triglycerides 0 - 149 mg/dL 122   129     Creatinine 0.44 - 1.00 mg/dL  2.93  1.65  1.76        01/08/2022    9:28 AM 01/08/2022    9:27 AM 01/07/2022    8:39 AM 12/11/2021   10:32 AM 09/17/2021    2:31 PM 09/17/2021    1:46 PM 06/05/2021    2:17 PM  BP/Weight  Systolic BP 768 115 726 203 559 741 638  Diastolic BP 68 78 68 55 70 75 72  Wt. (Lbs)  227 225.5 227  229.8   BMI  36.64 kg/m2 36.4 kg/m2 36.64 kg/m2  37.09 kg/m2        Latest Ref Rng & Units 09/17/2021    1:40 PM 07/15/2021   12:00 AM  Foot/eye exam completion dates  Eye Exam No Retinopathy  No Retinopathy      Foot Form Completion  Done      This result is from an external source.   uncontrolled     Hyperlipidemia Hyperlipidemia:Low fat diet discussed and encouraged. Uncontrolled, med adjustment needed   Lipid Panel  Lab Results  Component Value Date   CHOL 207 (H) 01/08/2022   HDL 42 01/08/2022   LDLCALC 143 (H) 01/08/2022   TRIG 122 01/08/2022   CHOLHDL 4.9 (H) 01/08/2022   Uncontrolled, need to verify meds taking    Morbid obesity ([place])  Patient re-educated about  the importance of commitment to a  minimum of 150 minutes of exercise per week as able.  The importance of healthy food choices with portion control discussed, as well as eating regularly and within a 12 hour window most days. The need to choose "clean , green" food 50 to 75% of the time is discussed, as well as to make water the primary drink and set a goal of 64 ounces water daily.       01/08/2022    9:27 AM 01/07/2022    8:39 AM 12/11/2021   10:32 AM  Weight /BMI  Weight 227 lb 225 lb 8 oz 227 lb  Height '5\' 6"'$  (1.676 m) '5\' 6"'$  (1.676 m) '5\' 6"'$  (1.676 m)  BMI 36.64 kg/m2 36.4 kg/m2 36.64 kg/m2      Left hand pain Recurrent pain and swelling, short course of prednisone prescribed, if not improved Ortho to re eval  Hyperuricemia Would  Benefit from medication to lower uric acid level, has severe  Kidney disease   IgG Kappa MGUS (monoclonal gammopathy of unknown significance) Being followed by Oncology

## 2022-01-11 NOTE — Assessment & Plan Note (Addendum)
Hyperlipidemia:Low fat diet discussed and encouraged. Uncontrolled, med adjustment needed   Lipid Panel  Lab Results  Component Value Date   CHOL 207 (H) 01/08/2022   HDL 42 01/08/2022   LDLCALC 143 (H) 01/08/2022   TRIG 122 01/08/2022   CHOLHDL 4.9 (H) 01/08/2022   Uncontrolled, need to verify meds taking

## 2022-01-11 NOTE — Assessment & Plan Note (Signed)
Being followed by Oncology

## 2022-01-12 ENCOUNTER — Other Ambulatory Visit: Payer: Self-pay | Admitting: Family Medicine

## 2022-01-13 ENCOUNTER — Other Ambulatory Visit (HOSPITAL_COMMUNITY): Payer: Self-pay | Admitting: Family Medicine

## 2022-01-13 ENCOUNTER — Ambulatory Visit (HOSPITAL_COMMUNITY)
Admission: RE | Admit: 2022-01-13 | Discharge: 2022-01-13 | Disposition: A | Discharge status: Home / Self Care | Payer: No Typology Code available for payment source | Source: Ambulatory Visit | Attending: Family Medicine | Admitting: Family Medicine

## 2022-01-13 DIAGNOSIS — R928 Other abnormal and inconclusive findings on diagnostic imaging of breast: Secondary | ICD-10-CM | POA: Diagnosis not present

## 2022-01-13 DIAGNOSIS — N6311 Unspecified lump in the right breast, upper outer quadrant: Secondary | ICD-10-CM | POA: Diagnosis not present

## 2022-01-13 DIAGNOSIS — N6312 Unspecified lump in the right breast, upper inner quadrant: Secondary | ICD-10-CM | POA: Diagnosis not present

## 2022-01-19 ENCOUNTER — Ambulatory Visit (HOSPITAL_COMMUNITY)
Admission: RE | Admit: 2022-01-19 | Discharge: 2022-01-19 | Disposition: A | Discharge status: Home / Self Care | Payer: No Typology Code available for payment source | Source: Ambulatory Visit | Attending: Family Medicine | Admitting: Family Medicine

## 2022-01-19 ENCOUNTER — Encounter (HOSPITAL_COMMUNITY): Payer: Self-pay

## 2022-01-19 ENCOUNTER — Other Ambulatory Visit (HOSPITAL_COMMUNITY): Payer: Self-pay | Admitting: Family Medicine

## 2022-01-19 ENCOUNTER — Other Ambulatory Visit: Payer: Self-pay | Admitting: Family Medicine

## 2022-01-19 DIAGNOSIS — R928 Other abnormal and inconclusive findings on diagnostic imaging of breast: Secondary | ICD-10-CM

## 2022-01-19 DIAGNOSIS — N641 Fat necrosis of breast: Secondary | ICD-10-CM | POA: Diagnosis not present

## 2022-01-19 DIAGNOSIS — N6312 Unspecified lump in the right breast, upper inner quadrant: Secondary | ICD-10-CM | POA: Diagnosis not present

## 2022-01-19 DIAGNOSIS — N6091 Unspecified benign mammary dysplasia of right breast: Secondary | ICD-10-CM | POA: Diagnosis not present

## 2022-01-19 HISTORY — PX: BREAST BIOPSY: SHX20

## 2022-01-19 MED ORDER — LIDOCAINE HCL (PF) 2 % IJ SOLN
INTRAMUSCULAR | Status: AC
  Administered 2022-01-19: 20 mL
  Filled 2022-01-19: qty 20

## 2022-01-19 MED ORDER — LIDOCAINE-EPINEPHRINE (PF) 1 %-1:200000 IJ SOLN
INTRAMUSCULAR | Status: AC
  Administered 2022-01-19: 20 mL
  Filled 2022-01-19: qty 30

## 2022-01-20 LAB — SURGICAL PATHOLOGY

## 2022-01-22 ENCOUNTER — Encounter: Payer: Self-pay | Admitting: Family Medicine

## 2022-01-22 ENCOUNTER — Ambulatory Visit (INDEPENDENT_AMBULATORY_CARE_PROVIDER_SITE_OTHER): Payer: No Typology Code available for payment source | Admitting: Family Medicine

## 2022-01-22 DIAGNOSIS — M79604 Pain in right leg: Secondary | ICD-10-CM | POA: Diagnosis not present

## 2022-01-22 DIAGNOSIS — M79605 Pain in left leg: Secondary | ICD-10-CM

## 2022-01-22 MED ORDER — PREDNISONE 5 MG PO TABS: 5.0000 mg | ORAL_TABLET | Freq: Two times a day (BID) | ORAL | 0 refills | Status: AC

## 2022-01-22 NOTE — Patient Instructions (Signed)
You need to go urgent care  as soon as  possible  for further evaluation since  you have new leg pain and report great difficulty walking, it is open on Saturday and Sunday   I have prescribed 5 day course of prednisone  Thanks for choosing [facility], we consider it a privelige to serve you.

## 2022-01-22 NOTE — Progress Notes (Signed)
Virtual Visit via Telephone Note  I connected with Kathleen [name] on 17/00/17 at  4:40 PM EDT by telephone and verified that I am speaking with the correct person using two identifiers.  Location: Patient: home Provider: office   I discussed the limitations, risks, security and privacy concerns of performing an evaluation and management service by telephone and the availability of in person appointments. I also discussed with the patient that there may be a patient responsible charge related to this service. The patient expressed understanding and agreed to proceed.   History of Present Illness: Bilateral leg pain and difficulty walking x 5 days, new, no significant inciting trauma, never had this before Denies leg swelling or shortness of breath   Observations/Objective:  There were no vitals taken for this visit. Good communication with no confusion and intact memory. Alert and oriented x 3 No signs of respiratory distress during speech  Assessment and Plan: Lower extremity pain 5 day h/o disabling lower ext pain limiting mobility, has arthritis in lumbar spine and some disc disease from imaging in the remote past Pred 5 mg twice daily for 5 days Recommend urgent care eval based on symptom severity   Follow Up Instructions:    I discussed the assessment and treatment plan with the patient. The patient was provided an opportunity to ask questions and all were answered. The patient agreed with the plan and demonstrated an understanding of the instructions.   The patient was advised to call back or seek an in-person evaluation if the symptoms worsen or if the condition fails to improve as anticipated.  I provided 12 minutes of non-face-to-face time during this encounter.   Tula Nakayama, MD

## 2022-01-22 NOTE — Assessment & Plan Note (Signed)
5 day h/o disabling lower ext pain limiting mobility, has arthritis in lumbar spine and some disc disease from imaging in the remote past Pred 5 mg twice daily for 5 days Recommend urgent care eval based on symptom severity

## 2022-01-23 DIAGNOSIS — M79604 Pain in right leg: Secondary | ICD-10-CM | POA: Diagnosis not present

## 2022-01-23 DIAGNOSIS — M79605 Pain in left leg: Secondary | ICD-10-CM | POA: Diagnosis not present

## 2022-02-04 ENCOUNTER — Encounter: Payer: Self-pay | Admitting: Family Medicine

## 2022-02-04 ENCOUNTER — Ambulatory Visit (INDEPENDENT_AMBULATORY_CARE_PROVIDER_SITE_OTHER): Payer: No Typology Code available for payment source | Admitting: Family Medicine

## 2022-02-04 VITALS — BP 170/80 | HR 87 | Ht 66.0 in | Wt 227.0 lb

## 2022-02-04 DIAGNOSIS — Z23 Encounter for immunization: Secondary | ICD-10-CM

## 2022-02-04 DIAGNOSIS — I2699 Other pulmonary embolism without acute cor pulmonale: Secondary | ICD-10-CM | POA: Diagnosis not present

## 2022-02-04 DIAGNOSIS — E1169 Type 2 diabetes mellitus with other specified complication: Secondary | ICD-10-CM

## 2022-02-04 DIAGNOSIS — E041 Nontoxic single thyroid nodule: Secondary | ICD-10-CM | POA: Diagnosis not present

## 2022-02-04 DIAGNOSIS — I10 Essential (primary) hypertension: Secondary | ICD-10-CM | POA: Diagnosis not present

## 2022-02-04 DIAGNOSIS — E790 Hyperuricemia without signs of inflammatory arthritis and tophaceous disease: Secondary | ICD-10-CM | POA: Diagnosis not present

## 2022-02-04 DIAGNOSIS — E7849 Other hyperlipidemia: Secondary | ICD-10-CM | POA: Diagnosis not present

## 2022-02-04 IMAGING — DX DG KNEE COMPLETE 4+V*R*
4 series · 4 of 4 positions shown · non-contrast
Comparison: None.

CLINICAL DATA: Acute pain right knee

EXAM:
RIGHT KNEE - COMPLETE 4+ VIEW

[knee ap]
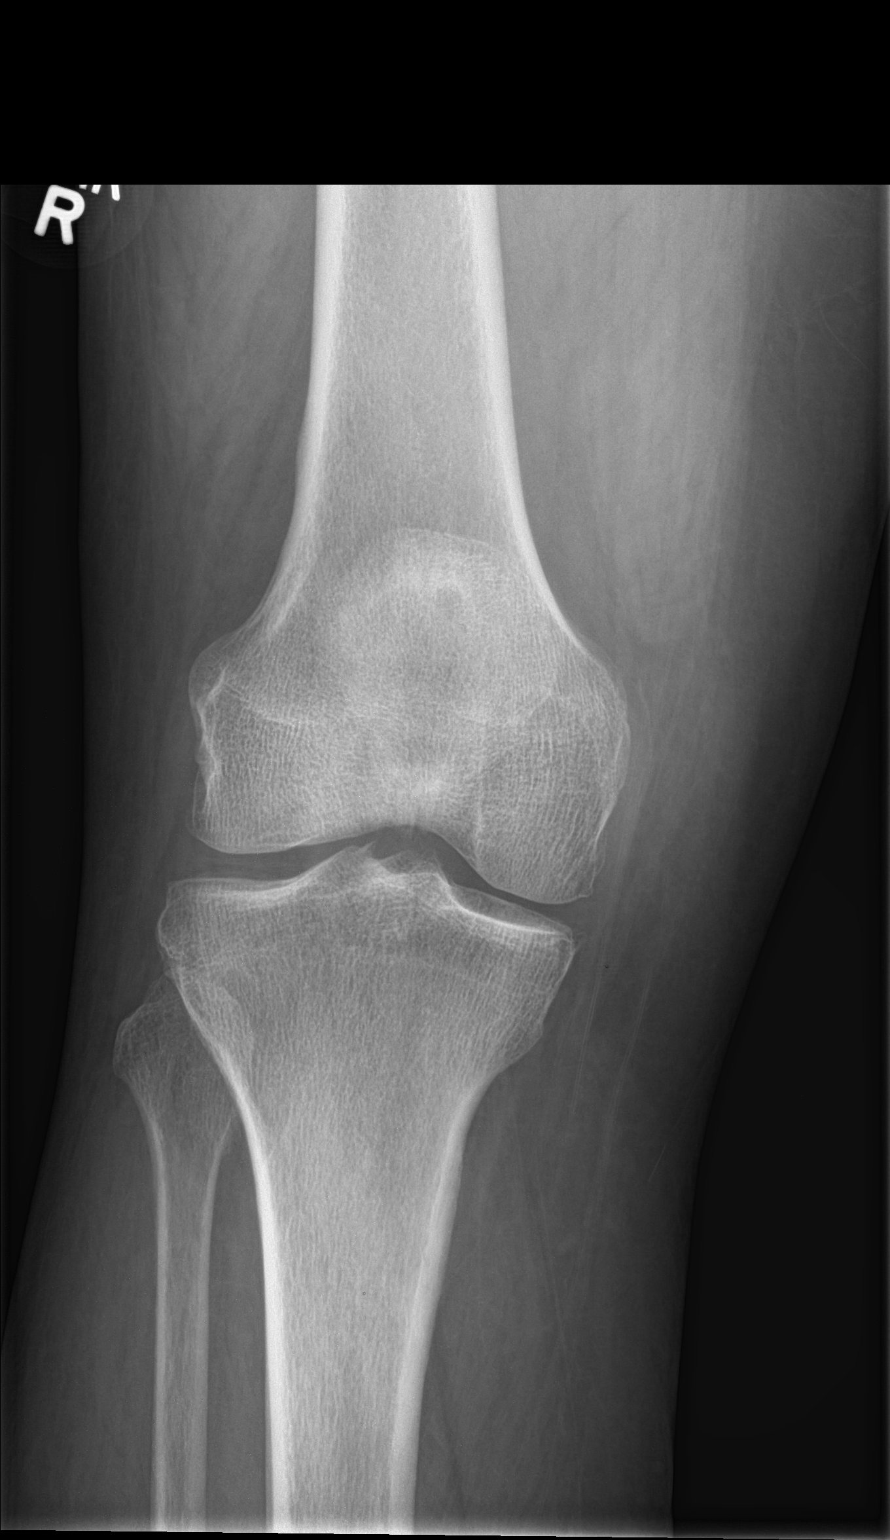

[knee lat]
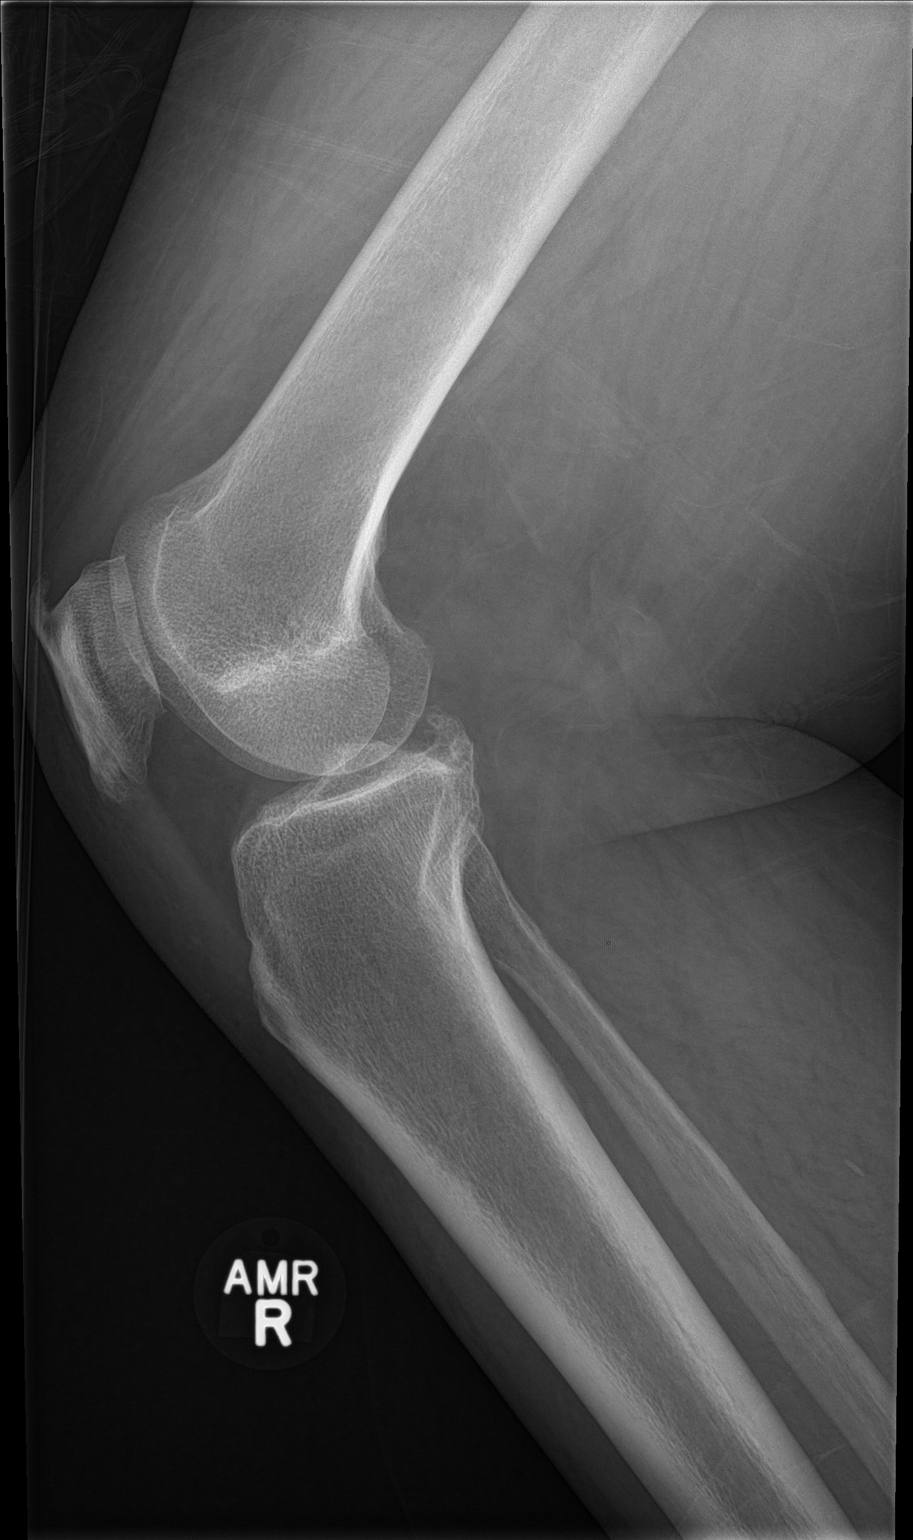

[knee obl (1 of 2)]
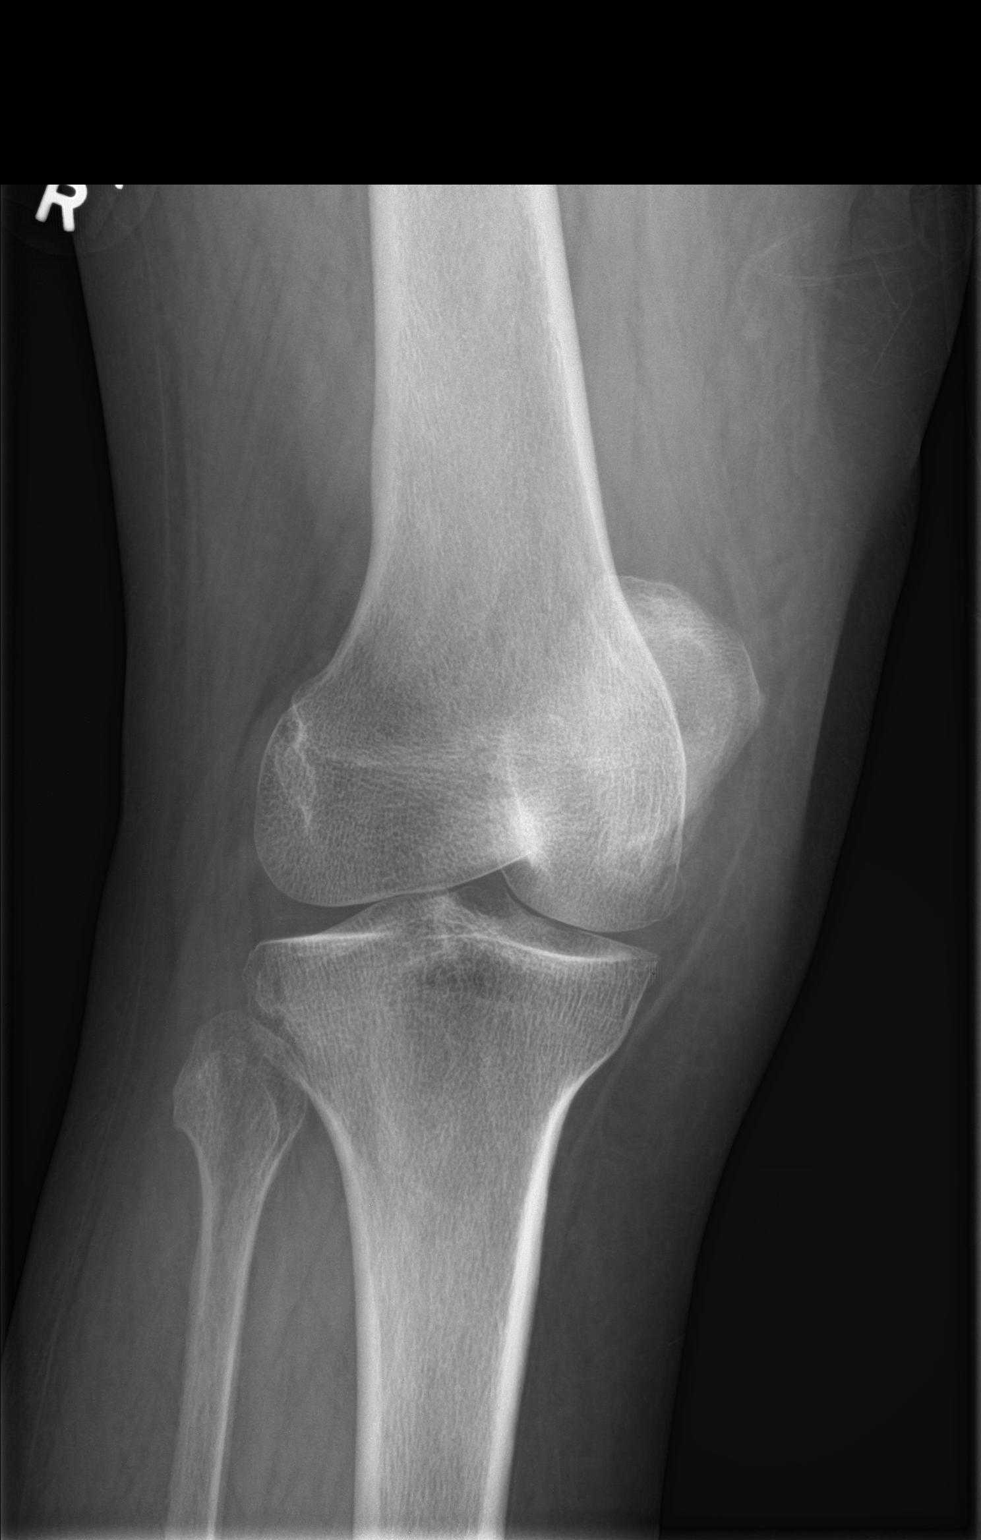

[knee obl (2 of 2)]
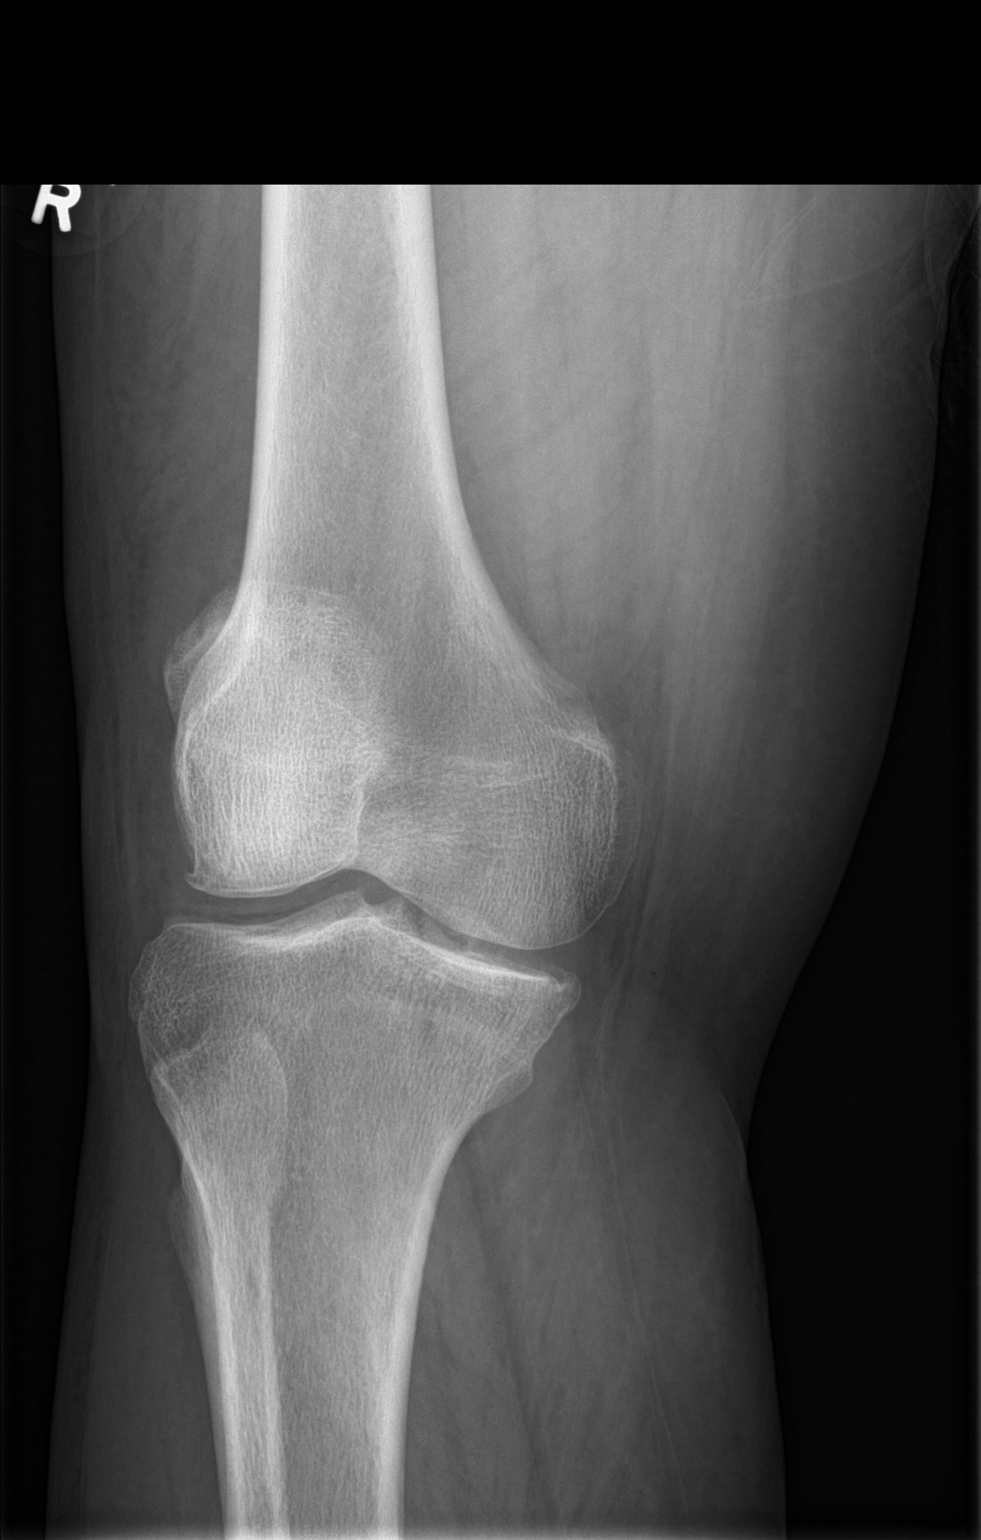

[4 of 4 positions shown; findings below may reference images not displayed]

FINDINGS: Negative for fracture. Mild degenerative change in the knee with
joint space narrowing and spurring in all 3 compartments. No joint
effusion or loose body.
IMPRESSION: Mild tricompartmental degenerative change.

## 2022-02-04 MED ORDER — CLONIDINE HCL 0.3 MG PO TABS: 0.3000 mg | ORAL_TABLET | Freq: Three times a day (TID) | ORAL | 2 refills | Status: DC

## 2022-02-04 MED ORDER — ALLOPURINOL 100 MG PO TABS: 100.0000 mg | ORAL_TABLET | Freq: Every day | ORAL | 6 refills | Status: DC

## 2022-02-04 MED ORDER — HYDRALAZINE HCL 25 MG PO TABS: 25.0000 mg | ORAL_TABLET | Freq: Three times a day (TID) | ORAL | 2 refills | Status: DC

## 2022-02-04 MED ORDER — ROSUVASTATIN CALCIUM 10 MG PO TABS: 10.0000 mg | ORAL_TABLET | Freq: Every day | ORAL | 1 refills | Status: DC

## 2022-02-04 NOTE — Patient Instructions (Addendum)
Annual exam in office 11/20 or after, re evaluate blood pressure and blood sugar also  Please enter additional name on dPR, her sister, Theodoro Parma with tele contct [phone] at [employer], RECHECK INFO , NAME AND ADDRESS WHEN ENTERING PLEASE  Flu vaccine today  Please get covid vaccine in next 1 to 3 weeks  New higher dose of crestor is 10 mg daily, need to reduce fried and fatty foods  Please get appointment for [place] and ENT evaluation asap, and let pt and her sister know  Cmp and EGFR today  Goal for fasting blood sugar ranges from 90 to 130 and at bedtime should be between 130 to 180.   New for blood pressure is hydralazine 25 mg one at 6 am , one at 2 pm and one at 10 pm  New for uric acid is allopurinol 100 mg one daily  Thanks for choosing [facility], we consider it a privelige to serve you.

## 2022-02-05 LAB — CMP14+EGFR
ALT: 24 IU/L (ref 0–32)
AST: 19 IU/L (ref 0–40)
Albumin/Globulin Ratio: 1.2 (ref 1.2–2.2)
Albumin: 4 g/dL (ref 3.8–4.8)
Alkaline Phosphatase: 157 IU/L — ABNORMAL HIGH (ref 44–121)
BUN/Creatinine Ratio: 12 (ref 12–28)
BUN: 18 mg/dL (ref 8–27)
Bilirubin Total: 0.2 mg/dL (ref 0.0–1.2)
CO2: 23 mmol/L (ref 20–29)
Calcium: 9.6 mg/dL (ref 8.7–10.3)
Chloride: 98 mmol/L (ref 96–106)
Creatinine, Ser: 1.46 mg/dL — ABNORMAL HIGH (ref 0.57–1.00)
Globulin, Total: 3.3 g/dL (ref 1.5–4.5)
Glucose: 194 mg/dL — ABNORMAL HIGH (ref 70–99)
Potassium: 4.4 mmol/L (ref 3.5–5.2)
Sodium: 139 mmol/L (ref 134–144)
Total Protein: 7.3 g/dL (ref 6.0–8.5)
eGFR: 38 mL/min/{1.73_m2} — ABNORMAL LOW (ref >59)

## 2022-02-07 ENCOUNTER — Encounter: Payer: Self-pay | Admitting: Family Medicine

## 2022-02-07 NOTE — Assessment & Plan Note (Signed)
Start allopurinol 100 mg daily

## 2022-02-07 NOTE — Assessment & Plan Note (Signed)
Uncontrolled,  Add hydralazine DASH diet and commitment to daily physical activity for a minimum of 30 minutes discussed and encouraged, as a part of hypertension management. The importance of attaining a healthy weight is also discussed.     02/04/2022    2:13 PM 02/04/2022    1:32 PM 02/04/2022    1:29 PM 01/19/2022    8:15 AM 01/08/2022    9:28 AM 01/08/2022    9:27 AM 01/07/2022    8:39 AM  BP/Weight  Systolic BP 182 993 716 967 893 810 175  Diastolic BP 80 62 81 84 68 78 68  Wt. (Lbs)   227   227 225.5  BMI   36.64 kg/m2   36.64 kg/m2 36.4 kg/m2

## 2022-02-07 NOTE — Assessment & Plan Note (Signed)
Uncontrolled , no recent chabnge in med, needs  To review diet Kathleen [name] is reminded of the importance of commitment to daily physical activity for 30 minutes or more, as able and the need to limit carbohydrate intake to 30 to 60 grams per meal to help with blood sugar control.   The need to take medication as prescribed, test blood sugar as directed, and to call between visits if there is a concern that blood sugar is uncontrolled is also discussed.   Kathleen [name] is reminded of the importance of daily foot exam, annual eye examination, and good blood sugar, blood pressure and cholesterol control.     Latest Ref Rng & Units 02/04/2022    2:33 PM 01/08/2022   10:30 AM 12/11/2021   11:08 AM 09/17/2021    2:56 PM 05/15/2021    2:25 AM  Diabetic Labs  HbA1c 4.8 - 5.6 %  9.8   6.9    Chol 100 - 199 mg/dL  207   91    HDL >39 mg/dL  42   35    Calc LDL 0 - 99 mg/dL  143   33    Triglycerides 0 - 149 mg/dL  122   129    Creatinine 0.57 - 1.00 mg/dL 1.46   2.93  1.65  1.76       02/04/2022    2:13 PM 02/04/2022    1:32 PM 02/04/2022    1:29 PM 01/19/2022    8:15 AM 01/08/2022    9:28 AM 01/08/2022    9:27 AM 01/07/2022    8:39 AM  BP/Weight  Systolic BP 660 600 459 977 414 239 532  Diastolic BP 80 62 81 84 68 78 68  Wt. (Lbs)   227   227 225.5  BMI   36.64 kg/m2   36.64 kg/m2 36.4 kg/m2      Latest Ref Rng & Units 09/17/2021    1:40 PM 07/15/2021   12:00 AM  Foot/eye exam completion dates  Eye Exam No Retinopathy  No Retinopathy      Foot Form Completion  Done      This result is from an external source.

## 2022-02-07 NOTE — Assessment & Plan Note (Signed)
Maintained on eliquis

## 2022-02-07 NOTE — Assessment & Plan Note (Signed)
Hyperlipidemia:Low fat diet discussed and encouraged.   Lipid Panel  Lab Results  Component Value Date   CHOL 207 (H) 01/08/2022   HDL 42 01/08/2022   LDLCALC 143 (H) 01/08/2022   TRIG 122 01/08/2022   CHOLHDL 4.9 (H) 01/08/2022     crestor 10 mg  To be started and needs to reduce fat in diet

## 2022-02-07 NOTE — Progress Notes (Signed)
Kathleen [name]     MRN: [id number]      DOB: January 30, 1951   HPI Kathleen [name] is here for follow up and re-evaluation of chronic medical conditions, medication management and review of any available recent lab and radiology data.  Preventive health is updated, specifically  Cancer screening and Immunization.   Questions or concerns regarding consultations or procedures which the PT has had in the interim are  addressed. The PT denies any adverse reactions to current medications since the last visit.  Still has thyroid evaluation outstanding ROS Denies recent fever or chills. Denies sinus pressure, nasal congestion, ear pain or sore throat. Denies chest congestion, productive cough or wheezing. Denies chest pains, palpitations and leg swelling Denies abdominal pain, nausea, vomiting,diarrhea or constipation.   Denies dysuria, frequency, hesitancy or incontinence. Denies joint pain, swelling and limitation in mobility. Denies headaches, seizures, numbness, or tingling. Denies depression, anxiety or insomnia. Denies skin break down or rash.   PE  BP (!) 170/80   Pulse 87   Ht '5\' 6"'$  (1.676 m)   Wt 227 lb (103 kg)   SpO2 94%   BMI 36.64 kg/m   Patient alert and oriented and in no cardiopulmonary distress.  HEENT: No facial asymmetry, EOMI,     Neck supple .  Chest: Clear to auscultation bilaterally.  CVS: S1, S2 no murmurs, no S3.Regular rate.  ABD: Soft non tender.   Ext: No edema  MS: Adequate ROM spine, shoulders, hips and knees.  Skin: Intact, no ulcerations or rash noted.  Psych: Good eye contact, normal affect. Memory intact not anxious or depressed appearing.  CNS: CN 2-12 intact, power,  normal throughout.no focal deficits noted.   Assessment & Plan  Essential hypertension Uncontrolled,  Add hydralazine DASH diet and commitment to daily physical activity for a minimum of 30 minutes discussed and encouraged, as a part of hypertension management. The  importance of attaining a healthy weight is also discussed.     02/04/2022    2:13 PM 02/04/2022    1:32 PM 02/04/2022    1:29 PM 01/19/2022    8:15 AM 01/08/2022    9:28 AM 01/08/2022    9:27 AM 01/07/2022    8:39 AM  BP/Weight  Systolic BP 725 366 440 347 425 956 387  Diastolic BP 80 62 81 84 68 78 68  Wt. (Lbs)   227   227 225.5  BMI   36.64 kg/m2   36.64 kg/m2 36.4 kg/m2       Type 2 diabetes mellitus ([facility]) Uncontrolled , no recent chabnge in med, needs  To review diet Kathleen [name] is reminded of the importance of commitment to daily physical activity for 30 minutes or more, as able and the need to limit carbohydrate intake to 30 to 60 grams per meal to help with blood sugar control.   The need to take medication as prescribed, test blood sugar as directed, and to call between visits if there is a concern that blood sugar is uncontrolled is also discussed.   Kathleen [name] is reminded of the importance of daily foot exam, annual eye examination, and good blood sugar, blood pressure and cholesterol control.     Latest Ref Rng & Units 02/04/2022    2:33 PM 01/08/2022   10:30 AM 12/11/2021   11:08 AM 09/17/2021    2:56 PM 05/15/2021    2:25 AM  Diabetic Labs  HbA1c 4.8 - 5.6 %  9.8   6.9  Chol 100 - 199 mg/dL  207   91    HDL >39 mg/dL  42   35    Calc LDL 0 - 99 mg/dL  143   33    Triglycerides 0 - 149 mg/dL  122   129    Creatinine 0.57 - 1.00 mg/dL 1.46   2.93  1.65  1.76       02/04/2022    2:13 PM 02/04/2022    1:32 PM 02/04/2022    1:29 PM 01/19/2022    8:15 AM 01/08/2022    9:28 AM 01/08/2022    9:27 AM 01/07/2022    8:39 AM  BP/Weight  Systolic BP 614 709 295 747 340 370 964  Diastolic BP 80 62 81 84 68 78 68  Wt. (Lbs)   227   227 225.5  BMI   36.64 kg/m2   36.64 kg/m2 36.4 kg/m2      Latest Ref Rng & Units 09/17/2021    1:40 PM 07/15/2021   12:00 AM  Foot/eye exam completion dates  Eye Exam No Retinopathy  No Retinopathy      Foot Form Completion  Done      This result  is from an external source.        Hyperuricemia Start allopurinol 100 mg daily  Hyperlipidemia Hyperlipidemia:Low fat diet discussed and encouraged.   Lipid Panel  Lab Results  Component Value Date   CHOL 207 (H) 01/08/2022   HDL 42 01/08/2022   LDLCALC 143 (H) 01/08/2022   TRIG 122 01/08/2022   CHOLHDL 4.9 (H) 01/08/2022     crestor 10 mg  To be started and needs to reduce fat in diet  Acute massive pulmonary embolism ([place]) Maintained on eliquis

## 2022-02-11 ENCOUNTER — Telehealth: Payer: Self-pay

## 2022-02-11 ENCOUNTER — Other Ambulatory Visit: Payer: Self-pay

## 2022-02-11 ENCOUNTER — Other Ambulatory Visit: Payer: Self-pay | Admitting: Family Medicine

## 2022-02-11 DIAGNOSIS — M25551 Pain in right hip: Secondary | ICD-10-CM

## 2022-02-11 MED ORDER — PREDNISONE 5 MG PO TABS: 5.0000 mg | ORAL_TABLET | Freq: Two times a day (BID) | ORAL | 0 refills | Status: AC

## 2022-02-11 NOTE — Telephone Encounter (Signed)
Patient aware referral sent

## 2022-02-11 NOTE — Telephone Encounter (Signed)
Patient called left voice mail during lunch hours asking for Dr Moshe Cipro nurse to give her a call please.

## 2022-02-15 ENCOUNTER — Ambulatory Visit (HOSPITAL_COMMUNITY)
Admission: RE | Admit: 2022-02-15 | Discharge: 2022-02-15 | Disposition: A | Discharge status: Home / Self Care | Payer: Medicare HMO | Source: Ambulatory Visit | Attending: Family Medicine | Admitting: Family Medicine

## 2022-02-15 DIAGNOSIS — E041 Nontoxic single thyroid nodule: Secondary | ICD-10-CM | POA: Insufficient documentation

## 2022-02-15 DIAGNOSIS — E042 Nontoxic multinodular goiter: Secondary | ICD-10-CM | POA: Diagnosis not present

## 2022-02-15 DIAGNOSIS — E010 Iodine-deficiency related diffuse (endemic) goiter: Secondary | ICD-10-CM | POA: Diagnosis not present

## 2022-02-17 ENCOUNTER — Ambulatory Visit: Payer: Medicare HMO | Admitting: Surgery

## 2022-02-17 ENCOUNTER — Encounter: Payer: Self-pay | Admitting: Surgery

## 2022-02-17 VITALS — BP 148/85 | HR 108 | Temp 98.3°F | Resp 12 | Ht 66.0 in | Wt 223.0 lb

## 2022-02-17 DIAGNOSIS — N6091 Unspecified benign mammary dysplasia of right breast: Secondary | ICD-10-CM

## 2022-02-17 NOTE — Progress Notes (Signed)
[facility] History and Physical  Reason for Referral: Right breast atypical lobular hyperplasia Referring Physician: Dr. Moshe Cipro  Chief Complaint   New Patient (Initial Visit)     Kathleen [name] is a 71 y.o. female.  HPI: Patient presents for evaluation of right breast atypical lobular hyperplasia.  This lesion was noted on her most recent mammogram after biopsy.  The patient has no history of any masses, lumps, bumps, nipple changes or discharge. She had menarche at age 59, and her first pregnancy at age 19. She is G4P4. She did not breastfeed her children.  She has no history of any family breast cancer. She underwent menopause in her 6s.  She has never had any previous biopsies or concerning areas on mammogram.  She has not had any chest radiation.  She does have a family history of colon cancer in her sister.  She denies ever having any surgeries.  Her past medical history is significant for hypertension, diabetes on insulin, Eliquis for history of unprovoked PE.  She denies nausea, vomiting, fever, and chills.  She denies use of tobacco products, alcohol, and illicit drugs.   Past Medical History:  Diagnosis Date   Acute respiratory failure with hypoxia ([place]) 10/22/2019   Anemia    Arthritis    Chronic kidney disease    Diabetes mellitus type II    Dysphagia    unspecified    GERD (gastroesophageal reflux disease)    Hyperlipidemia 2000   Hypertension 1995   Insomnia    Low back pain    Obesity    PE (pulmonary thromboembolism) ([place])    Sleep apnea in adult 11/25/2019   Thyroid nodule 11/25/2019    Past Surgical History:  Procedure Laterality Date   Carpal tunnel release     left    CHOLECYSTECTOMY  2007   DIGIT NAIL REMOVAL  08/2011   KNEE SURGERY  1.20.2012   arthroscopy LEFT knee partial medial meniscectomy   TENOTOMY     2,3,4  left foot    toenail removal     TUBAL LIGATION      Family History  Problem Relation Age of Onset   Diabetes  Mother    Hypertension Mother        MI   Heart disease Mother    Kidney disease Mother 21       dialysis   Diabetes Father    Diabetes Sister    Dementia Sister    Diabetes Brother    Diabetes Brother    Kidney disease Brother    Breast cancer Neg Hx     Social History   Tobacco Use   Smoking status: Never   Smokeless tobacco: Never  Vaping Use   Vaping Use: Never used  Substance Use Topics   Alcohol use: No   Drug use: No    Medications: I have reviewed the patient's current medications. Allergies as of 02/17/2022   No Known Allergies      Medication List        Accurate as of February 17, 2022  1:14 PM. If you have any questions, ask your nurse or doctor.          Accu-Chek Guide test strip Generic drug: glucose blood USE TO TEST BLOOD SUGAR THREE TIMES DAILY AS DIRECTED      DX E11.65   allopurinol 100 MG tablet Commonly known as: ZYLOPRIM Take 1 tablet (100 mg total) by mouth daily.   aspirin EC 81 MG  tablet Take 81 mg by mouth daily. Swallow whole.   cholecalciferol 1000 units tablet Commonly known as: VITAMIN D Take 1,000 Units by mouth daily.   cloNIDine 0.3 MG tablet Commonly known as: Catapres Take 1 tablet (0.3 mg total) by mouth 3 (three) times daily.   cloNIDine 0.3 mg/24hr patch Commonly known as: Catapres-TTS-3 Place 1 patch (0.3 mg total) onto the skin once a week.   Eliquis 5 MG Tabs tablet Generic drug: apixaban TAKE (1) TABLET BY MOUTH TWICE DAILY.   fluticasone 50 MCG/ACT nasal spray Commonly known as: FLONASE Place into both nostrils daily.   gabapentin 100 MG capsule Commonly known as: NEURONTIN Take 1 capsule (100 mg total) by mouth 2 (two) times daily.   glipiZIDE 2.5 MG 24 hr tablet Commonly known as: GLUCOTROL XL TAKE (1) TABLET BY MOUTH ONCE DAILY.   hydrALAZINE 25 MG tablet Commonly known as: APRESOLINE Take 1 tablet (25 mg total) by mouth 3 (three) times daily.   hydrochlorothiazide 25 MG  tablet Commonly known as: HYDRODIURIL Take 1 tablet (25 mg total) by mouth daily.   Lantus SoloStar 100 UNIT/ML Solostar Pen Generic drug: insulin glargine INJECT 60 UNITS INTO THE SKIN ONCE DAILY.   mirtazapine 7.5 MG tablet Commonly known as: REMERON Take 1 tablet (7.5 mg total) by mouth at bedtime. This is for sleep and for your appetitie   montelukast 10 MG tablet Commonly known as: SINGULAIR TAKE (1) TABLET BY MOUTH AT BEDTIME.   olmesartan 20 MG tablet Commonly known as: BENICAR Take 1 tablet (20 mg total) by mouth daily.   rosuvastatin 10 MG tablet Commonly known as: Crestor Take 1 tablet (10 mg total) by mouth daily.         ROS:  Constitutional: negative for chills, fatigue, and fevers Eyes: negative for visual disturbance and pain Ears, nose, mouth, throat, and face: negative for ear drainage, sore throat, and sinus problems Respiratory: negative for cough, wheezing, and shortness of breath Cardiovascular: negative for chest pain and palpitations Gastrointestinal: negative for abdominal pain, nausea, reflux symptoms, and vomiting Genitourinary:negative for dysuria, frequency, and urinary retention Integument/breast: negative for dryness and rash Hematologic/lymphatic: negative for bleeding and lymphadenopathy Musculoskeletal:positive for back pain and joint pain, negative for neck pain Neurological: negative for dizziness, tremors, and numbness Endocrine: negative for temperature intolerance  Blood pressure (!) 148/85, pulse (!) 108, temperature 98.3 F (36.8 C), temperature source Oral, resp. rate 12, height '5\' 6"'$  (1.676 m), weight 223 lb (101.2 kg), SpO2 95 %. Physical Exam Vitals reviewed.  Constitutional:      Appearance: Normal appearance.  HENT:     Head: Normocephalic and atraumatic.  Eyes:     Extraocular Movements: Extraocular movements intact.     Pupils: Pupils are equal, round, and reactive to light.  Cardiovascular:     Rate and Rhythm:  Normal rate and regular rhythm.  Pulmonary:     Effort: Pulmonary effort is normal.     Breath sounds: Normal breath sounds.  Chest:  Breasts:    Right: Normal. No swelling, bleeding, inverted nipple, mass, nipple discharge, skin change or tenderness.     Left: Normal. No swelling, bleeding, inverted nipple, mass, nipple discharge, skin change or tenderness.  Abdominal:     General: There is no distension.     Palpations: Abdomen is soft.     Tenderness: There is no abdominal tenderness.  Musculoskeletal:        General: Normal range of motion.     Cervical back:  Normal range of motion.  Lymphadenopathy:     Upper Body:     Right upper body: No supraclavicular, axillary or pectoral adenopathy.     Left upper body: No supraclavicular, axillary or pectoral adenopathy.  Skin:    General: Skin is warm and dry.  Neurological:     General: No focal deficit present.     Mental Status: She is alert and oriented to person, place, and time.  Psychiatric:        Mood and Affect: Mood normal.        Behavior: Behavior normal.     Results: Bilateral screening mammogram (10/30/2021): FINDINGS: In the right breast, a possible asymmetry warrants further evaluation. In the left breast, no findings suspicious for malignancy.   IMPRESSION: Further evaluation is suggested for possible asymmetry in the right breast.   RECOMMENDATION: Diagnostic mammogram and possibly ultrasound of the right breast. (Code:[id number])   The patient will be contacted regarding the findings, and additional imaging will be scheduled.   BI-RADS CATEGORY  0: Incomplete. Need additional imaging evaluation and/or prior mammograms for comparison.  Right breast diagnostic mammogram (01/13/2022): IMPRESSION: 1. Four indeterminate masses in the 1 o'clock position of the right breast 6-8 cm from the nipple. The two larger masses, 1 measuring 8 mm and the other measuring 5 mm in size are mildly irregular in shape and  are more suspicious for possible malignancy. 2. 7 mm indeterminate mass in the 12 o'clock retroareolar right breast. This could represent a solid mass or complicated cyst. 3. Single right axillary lymph node with mild eccentric cortical thickening, morphologically different than the other right axillary lymph nodes, suspicious for possible metastatic node.   RECOMMENDATION: 1. Ultrasound-guided core needle biopsies of the 8 mm and 5 mm masses in the 1 o'clock position of the right breast. 2. Ultrasound-guided core needle biopsy of the single abnormal appearing right axillary lymph node. 3. If all 3 ultrasound-guided core needle biopsies are negative malignancy, a six-month follow-up right breast ultrasound would be recommended for the 3 additional indeterminate right breast masses.   I have discussed the findings and recommendations with the patient. If applicable, a reminder letter will be sent to the patient regarding the next appointment.   BI-RADS CATEGORY  4: Suspicious.  Right breast ultrasound with axilla (01/13/2022): IMPRESSION: 1. Four indeterminate masses in the 1 o'clock position of the right breast 6-8 cm from the nipple. The two larger masses, 1 measuring 8 mm and the other measuring 5 mm in size are mildly irregular in shape and are more suspicious for possible malignancy. 2. 7 mm indeterminate mass in the 12 o'clock retroareolar right breast. This could represent a solid mass or complicated cyst. 3. Single right axillary lymph node with mild eccentric cortical thickening, morphologically different than the other right axillary lymph nodes, suspicious for possible metastatic node.   RECOMMENDATION: 1. Ultrasound-guided core needle biopsies of the 8 mm and 5 mm masses in the 1 o'clock position of the right breast. 2. Ultrasound-guided core needle biopsy of the single abnormal appearing right axillary lymph node. 3. If all 3 ultrasound-guided core needle biopsies are  negative malignancy, a six-month follow-up right breast ultrasound would be recommended for the 3 additional indeterminate right breast masses.   I have discussed the findings and recommendations with the patient. If applicable, a reminder letter will be sent to the patient regarding the next appointment.   BI-RADS CATEGORY  4: Suspicious.  Right breast ultrasound-guided biopsy (01/21/2022): ADDENDUM: PATHOLOGY revealed:  Site A. BREAST, RETROAREOLAR, 12:00, MASS, RIGHT, BIOPSY: - Atypical lobular hyperplasia.   Pathology results are CONCORDANT with imaging findings, per Dr. Ammie Ferrier with consideration of excision recommended.   PATHOLOGY revealed: Site B. BREAST, 1:00, MASS, RIGHT, BIOPSY: - Fat necrosis and foreign body giant cell reaction, suggestive of previous procedure-related change.- Negative for carcinoma.   Pathology results are CONCORDANT with imaging findings, per Dr. Ammie Ferrier.   PATHOLOGY revealed: Site C. BREAST, 12:00, MASS, RIGHT, BIOPSY: - Fat necrosis and foreign body giant cell reaction, suggestive of previous procedure-related change - Negative for carcinoma.   Pathology results are CONCORDANT with imaging findings, per Dr. Ammie Ferrier.   Pathology results and recommendations below were discussed with patient by telephone on 01/20/2022. Patient reported biopsy site within normal limits with slight tenderness at the site. Post biopsy care instructions were reviewed, questions were answered and my direct phone number was provided to patient. Patient was instructed to call [facility] Mammography Department if any concerns or questions arise related to the biopsy.   RECOMMENDATIONS:   1. Surgical consultation for possible excision for Site A only. Request for surgical consultation was relayed to Kathi Der RT at [facility] Mammography Department by Electa Sniff RN on 01/20/2022.   2. If patient does not  proceed with surgery for Site A, then patient to return in six months for mammogram and ultrasound to evaluate three RIGHT breast non-biopsied masses, RIGHT axillary lymph node and site A (Atypical lobular hyperplasia) if it is not excised.   Pathology results reported by Electa Sniff RN on 01/20/2022.  Assessment & Plan:  Kathleen [name] is a 71 y.o. female who presents to discuss excision of right breast atypical lobular hyperplasia.  -I explained to the patient that this lesion increases her future risk for breast cancer, however it is not a precancerous lesion that will eventually turn into cancer.  I also explained that the current recommendation is for [place] to excise this area to verify there is no underlying carcinoma or carcinoma in situ. -The area of concern is retroareolar.  Given that there is no palpable mass, I am recommending that the patient have Faxitron tag placement preoperatively -The risk and benefits of right breast lumpectomy were discussed including but not limited to bleeding, infection, injury to surrounding structures, and need for additional procedures.  After careful consideration, Kathleen [name] has decided to proceed with surgery.  -Advised the patient that she will need to hold her Eliquis and aspirin 2 days prior to surgery -I further explained to the patient that she may require subsequent reexcision of the biopsy cavity if any margins are positive or sentinel lymph node biopsy if the final specimen demonstrates carcinoma -Information provided to the patient regarding surgery and atypical hyperplasia of the breast -Will schedule patient for surgery and for Faxitron tag placement  All questions were answered to the satisfaction of the patient and family.   Graciella Freer, DO [facility] [address] [place], [place] [address] [phone] (office)

## 2022-02-22 ENCOUNTER — Ambulatory Visit (INDEPENDENT_AMBULATORY_CARE_PROVIDER_SITE_OTHER): Payer: Medicare HMO

## 2022-02-22 ENCOUNTER — Ambulatory Visit: Payer: Medicare HMO | Admitting: Orthopedic Surgery

## 2022-02-22 ENCOUNTER — Encounter: Payer: Self-pay | Admitting: Orthopedic Surgery

## 2022-02-22 VITALS — BP 168/93 | HR 122 | Ht 66.0 in | Wt 222.0 lb

## 2022-02-22 DIAGNOSIS — M545 Low back pain, unspecified: Secondary | ICD-10-CM | POA: Diagnosis not present

## 2022-02-22 DIAGNOSIS — M25551 Pain in right hip: Secondary | ICD-10-CM | POA: Diagnosis not present

## 2022-02-22 DIAGNOSIS — M25552 Pain in left hip: Secondary | ICD-10-CM

## 2022-02-22 DIAGNOSIS — M48061 Spinal stenosis, lumbar region without neurogenic claudication: Secondary | ICD-10-CM

## 2022-02-22 MED ORDER — GABAPENTIN 100 MG PO CAPS: 100.0000 mg | ORAL_CAPSULE | Freq: Three times a day (TID) | ORAL | 2 refills | Status: DC

## 2022-02-22 MED ORDER — TIZANIDINE HCL 4 MG PO TABS: 4.0000 mg | ORAL_TABLET | Freq: Four times a day (QID) | ORAL | 0 refills | Status: DC | PRN

## 2022-02-22 NOTE — Patient Instructions (Addendum)
For pain   Take 500 mg tylenol every 6 hours  Meds ordered this encounter  Medications   tiZANidine (ZANAFLEX) 4 MG tablet    Sig: Take 1 tablet (4 mg total) by mouth every 6 (six) hours as needed for muscle spasms.    Dispense:  30 tablet    Refill:  0   gabapentin (NEURONTIN) 100 MG capsule    Sig: Take 1 capsule (100 mg total) by mouth 3 (three) times daily.    Dispense:  90 capsule    Refill:  2    Start physical therapy   Use heating pad   Physical therapy has been ordered for you at [facility]. They should call you to schedule, [phone] is the phone number to call, if you want to call to schedule.

## 2022-02-22 NOTE — Progress Notes (Signed)
NEW PROBLEM//OFFICE VISIT   Chief Complaint  Patient presents with   Hip Pain    Left, started in the rightand left thigh now has moved to the left but describes pain in the lower back that goes down left leg, has been hurting two months   71 year old female with 2 months of pain in her legs and both hips and lower back she had 2 bouts of medication from primary care gotten better initially but now pain in the lower back both buttocks and radiating down both legs   With pain in the right leg to the knee into the left of the foot  History of diabetes  Hypertension  Osteoarthritis left knee  (Medication looks to be prednisone)  Note the patient indicates she cannot stand or sit for long periods of time she is now using a cane   ELIQUIS   ROS: Leg swelling bowel and bladder function intact  No Known Allergies  Current Outpatient Medications  Medication Instructions   allopurinol (ZYLOPRIM) 100 mg, Oral, Daily   aspirin EC 81 mg, Oral, Daily, Swallow whole.   cholecalciferol (VITAMIN D) 1,000 Units, Oral, Daily   cloNIDine (CATAPRES) 0.3 mg, Oral, 3 times daily   cloNIDine (CATAPRES-TTS-3) 0.3 mg, Transdermal, Weekly   ELIQUIS 5 MG TABS tablet TAKE (1) TABLET BY MOUTH TWICE DAILY.   fluticasone (FLONASE) 50 MCG/ACT nasal spray Each Nare, Daily   gabapentin (NEURONTIN) 100 mg, Oral, 3 times daily   glipiZIDE (GLUCOTROL XL) 2.5 MG 24 hr tablet TAKE (1) TABLET BY MOUTH ONCE DAILY.   glucose blood (ACCU-CHEK GUIDE) test strip USE TO TEST BLOOD SUGAR THREE TIMES DAILY AS DIRECTED      DX E11.65   hydrALAZINE (APRESOLINE) 25 mg, Oral, 3 times daily   hydrochlorothiazide (HYDRODIURIL) 25 mg, Oral, Daily   LANTUS SOLOSTAR 100 UNIT/ML Solostar Pen INJECT 60 UNITS INTO THE SKIN ONCE DAILY.   mirtazapine (REMERON) 7.5 mg, Oral, Daily at bedtime, This is for sleep and for your appetitie   montelukast (SINGULAIR) 10 MG tablet TAKE (1) TABLET BY MOUTH AT BEDTIME.   olmesartan (BENICAR) 20  mg, Oral, Daily   rosuvastatin (CRESTOR) 10 mg, Oral, Daily   tiZANidine (ZANAFLEX) 4 mg, Oral, Every 6 hours PRN       BP (!) 168/93   Pulse (!) 122   Ht '5\' 6"'$  (1.676 m)   Wt 222 lb (100.7 kg)   BMI 35.83 kg/m   Body mass index is 35.83 kg/m.  General appearance: Well-developed well-nourished no gross deformities  Cardiovascular normal pulse and perfusion normal color with pitting edema in the lower extremities   neurologically no sensation loss or deficits or pathologic reflexes  Psychological: Awake alert and oriented x3 mood and affect normal  Skin no lacerations or ulcerations no nodularity no palpable masses, no erythema or nodularity  Musculoskeletal: cane for support   Bilateral hip range of motion is normal does not reproduce any pain  She is tender in her lower back both SI joints both buttocks  We did internal imaging  We see abnormal alignment and spondylosis with mild spondylolsithesis   Encounter Diagnoses  Name Primary?   Lumbar pain    Bilateral hip pain    Degenerative lumbar spinal stenosis Yes    Meds ordered this encounter  Medications   tiZANidine (ZANAFLEX) 4 MG tablet    Sig: Take 1 tablet (4 mg total) by mouth every 6 (six) hours as needed for muscle spasms.    Dispense:  30 tablet    Refill:  0   gabapentin (NEURONTIN) 100 MG capsule    Sig: Take 1 capsule (100 mg total) by mouth 3 (three) times daily.    Dispense:  90 capsule    Refill:  2   Re ck 4- 6 weeks         Past Medical History:  Diagnosis Date   Acute respiratory failure with hypoxia ([place]) 10/22/2019   Anemia    Arthritis    Chronic kidney disease    Diabetes mellitus type II    Dysphagia    unspecified    GERD (gastroesophageal reflux disease)    Hyperlipidemia 2000   Hypertension 1995   Insomnia    Low back pain    Obesity    PE (pulmonary thromboembolism) ([facility])    Sleep apnea in adult 11/25/2019   Thyroid nodule 11/25/2019    Past Surgical  History:  Procedure Laterality Date   Carpal tunnel release     left    CHOLECYSTECTOMY  2007   DIGIT NAIL REMOVAL  08/2011   KNEE SURGERY  1.20.2012   arthroscopy LEFT knee partial medial meniscectomy   TENOTOMY     2,3,4  left foot    toenail removal     TUBAL LIGATION      Family History  Problem Relation Age of Onset   Diabetes Mother    Hypertension Mother        MI   Heart disease Mother    Kidney disease Mother 77       dialysis   Diabetes Father    Diabetes Sister    Dementia Sister    Diabetes Brother    Diabetes Brother    Kidney disease Brother    Breast cancer Neg Hx    Social History   Tobacco Use   Smoking status: Never   Smokeless tobacco: Never  Vaping Use   Vaping Use: Never used  Substance Use Topics   Alcohol use: No   Drug use: No    No Known Allergies  Current Meds  Medication Sig   allopurinol (ZYLOPRIM) 100 MG tablet Take 1 tablet (100 mg total) by mouth daily.   aspirin EC 81 MG tablet Take 81 mg by mouth daily. Swallow whole.   cholecalciferol (VITAMIN D) 1000 UNITS tablet Take 1,000 Units by mouth daily.   cloNIDine (CATAPRES) 0.3 MG tablet Take 1 tablet (0.3 mg total) by mouth 3 (three) times daily.   cloNIDine (CATAPRES-TTS-3) 0.3 mg/24hr patch Place 1 patch (0.3 mg total) onto the skin once a week.   ELIQUIS 5 MG TABS tablet TAKE (1) TABLET BY MOUTH TWICE DAILY.   fluticasone (FLONASE) 50 MCG/ACT nasal spray Place into both nostrils daily.   gabapentin (NEURONTIN) 100 MG capsule Take 1 capsule (100 mg total) by mouth 3 (three) times daily.   glipiZIDE (GLUCOTROL XL) 2.5 MG 24 hr tablet TAKE (1) TABLET BY MOUTH ONCE DAILY.   glucose blood (ACCU-CHEK GUIDE) test strip USE TO TEST BLOOD SUGAR THREE TIMES DAILY AS DIRECTED      DX E11.65   hydrALAZINE (APRESOLINE) 25 MG tablet Take 1 tablet (25 mg total) by mouth 3 (three) times daily.   hydrochlorothiazide (HYDRODIURIL) 25 MG tablet Take 1 tablet (25 mg total) by mouth daily.    LANTUS SOLOSTAR 100 UNIT/ML Solostar Pen INJECT 60 UNITS INTO THE SKIN ONCE DAILY.   mirtazapine (REMERON) 7.5 MG tablet Take 1 tablet (7.5 mg total) by mouth at  bedtime. This is for sleep and for your appetitie   montelukast (SINGULAIR) 10 MG tablet TAKE (1) TABLET BY MOUTH AT BEDTIME.   olmesartan (BENICAR) 20 MG tablet Take 1 tablet (20 mg total) by mouth daily.   rosuvastatin (CRESTOR) 10 MG tablet Take 1 tablet (10 mg total) by mouth daily.   tiZANidine (ZANAFLEX) 4 MG tablet Take 1 tablet (4 mg total) by mouth every 6 (six) hours as needed for muscle spasms.   [DISCONTINUED] gabapentin (NEURONTIN) 100 MG capsule Take 1 capsule (100 mg total) by mouth 2 (two) times daily.     MEDICAL DECISION MAKING  A.  Encounter Diagnoses  Name Primary?   Lumbar pain    Bilateral hip pain    Degenerative lumbar spinal stenosis Yes    B. DATA ANALYSED:   IMAGING: Interpretation of images: I have personally reviewed the images and my interpretation is spinal stenosis with abnormal coronal and sagittal plane alignment facet arthritis in the lower segments mild spondylolisthesis L4 on L5 Orders: Physical therapy Outside records reviewed: Dr. Moshe Cipro notes  C. MANAGEMENT   Physical therapy  Tylenol 500 mg 4 times daily  Gabapentin 100 mg 3 times daily  71 year old female with spinal stenosis on Eliquis.  This limits her treatment options.  We will start with a basic physical therapy, continue gabapentin.  At present again.  Use Tylenol 500 mg every 6.  Do not use opioids to control pain  She is not really a good candidate for NSAID therapy because of the Eliquis.   If no improvement patient will be referred to neurosurgery  Meds ordered this encounter  Medications   tiZANidine (ZANAFLEX) 4 MG tablet    Sig: Take 1 tablet (4 mg total) by mouth every 6 (six) hours as needed for muscle spasms.    Dispense:  30 tablet    Refill:  0   gabapentin (NEURONTIN) 100 MG capsule    Sig: Take  1 capsule (100 mg total) by mouth 3 (three) times daily.    Dispense:  90 capsule    Refill:  2     Arther Abbott, MD  02/22/2022 11:25 AM

## 2022-02-23 ENCOUNTER — Telehealth: Payer: Self-pay | Admitting: *Deleted

## 2022-02-23 ENCOUNTER — Other Ambulatory Visit (HOSPITAL_COMMUNITY): Payer: Self-pay | Admitting: Surgery

## 2022-02-23 DIAGNOSIS — N6099 Unspecified benign mammary dysplasia of unspecified breast: Secondary | ICD-10-CM

## 2022-02-23 NOTE — Telephone Encounter (Signed)
Call placed to patient to discuss upcoming surgical appointments.   Patient states that she is not going to proceed with excision of breast at this time. States that she is having increased issues with her back pain and is currently unable to stand or walk. Reports that she is under care of Ortho at this time and will call back to re-schedule if she wishes to proceed.   Of note, patient will require diagnostic mammograms yearly if she does not wish to proceed with excision per radiologist.   Upcoming surgery and radiology appointments cancelled.

## 2022-02-24 ENCOUNTER — Other Ambulatory Visit: Payer: Self-pay | Admitting: Family Medicine

## 2022-03-02 ENCOUNTER — Ambulatory Visit (HOSPITAL_COMMUNITY): Payer: Medicare HMO

## 2022-03-02 ENCOUNTER — Other Ambulatory Visit: Payer: Self-pay | Admitting: Family Medicine

## 2022-03-03 ENCOUNTER — Other Ambulatory Visit (HOSPITAL_COMMUNITY): Payer: Medicare HMO

## 2022-03-05 ENCOUNTER — Encounter (HOSPITAL_COMMUNITY): Admission: RE | Payer: Self-pay | Source: Home / Self Care

## 2022-03-05 ENCOUNTER — Ambulatory Visit (HOSPITAL_COMMUNITY): Admission: RE | Admit: 2022-03-05 | Payer: Medicare HMO | Source: Home / Self Care | Admitting: Surgery

## 2022-03-05 ENCOUNTER — Other Ambulatory Visit: Payer: Self-pay | Admitting: Family Medicine

## 2022-03-05 SURGERY — BREAST BIOPSY WITH RADIO FREQUENCY LOCALIZER
Anesthesia: Choice | Laterality: Right

## 2022-03-15 ENCOUNTER — Other Ambulatory Visit: Payer: Self-pay | Admitting: Family Medicine

## 2022-03-15 NOTE — Telephone Encounter (Signed)
Please send electronically

## 2022-03-25 ENCOUNTER — Ambulatory Visit (HOSPITAL_COMMUNITY): Payer: Medicare HMO | Attending: Orthopedic Surgery | Admitting: Physical Therapy

## 2022-03-25 NOTE — Therapy (Incomplete)
OUTPATIENT PHYSICAL THERAPY THORACOLUMBAR EVALUATION   Patient Name: Kathleen [name] MRN: [id number] DOB:07/23/1950, 71 y.o., female Today's Date: 03/25/2022  END OF SESSION:   Past Medical History:  Diagnosis Date   Acute respiratory failure with hypoxia ([place]) 10/22/2019   Anemia    Arthritis    Chronic kidney disease    Diabetes mellitus type II    Dysphagia    unspecified    GERD (gastroesophageal reflux disease)    Hyperlipidemia 2000   Hypertension 1995   Insomnia    Low back pain    Obesity    PE (pulmonary thromboembolism) ([place])    Sleep apnea in adult 11/25/2019   Thyroid nodule 11/25/2019   Past Surgical History:  Procedure Laterality Date   Carpal tunnel release     left    CHOLECYSTECTOMY  2007   DIGIT NAIL REMOVAL  08/2011   KNEE SURGERY  1.20.2012   arthroscopy LEFT knee partial medial meniscectomy   TENOTOMY     2,3,4  left foot    toenail removal     TUBAL LIGATION     Patient Active Problem List   Diagnosis Date Noted   Hyperuricemia 01/11/2022   IgG Kappa MGUS (monoclonal gammopathy of unknown significance) 12/11/2021   Hypomagnesemia 05/15/2021   Hyperkalemia 05/14/2021   Acute massive pulmonary embolism ([place]) 01/11/2021   Left hand pain 08/05/2020   Sleep apnea 11/25/2019   Abnormal CT scan, chest 11/25/2019   Thyroid nodule 11/25/2019   Chronic kidney disease, stage 3 unspecified ([place]) 05/11/2019   Oxygen dependent 05/11/2019   Finger deformity, acquired 04/03/2018   Knee pain, right 03/19/2018   Seasonal allergies 09/17/2013   Unilateral primary osteoarthritis, left knee 01/11/2012   Morbid obesity ([place]) 02/03/2009   Lower extremity pain 03/01/2008   Hyperlipidemia 11/24/2007   Type 2 diabetes mellitus ([place]) 06/09/2007   Essential hypertension 04/26/2007   GERD 04/26/2007    PCP: Tula Nakayama MD  REFERRING PROVIDER: Carole Civil, MD  REFERRING DIAG: M54.50 (ICD-10-CM) - Lumbar pain M48.061 (ICD-10-CM) -  Degenerative lumbar spinal stenosis  Rationale for Evaluation and Treatment: Rehabilitation  THERAPY DIAG:  No diagnosis found.  ONSET DATE: ***  SUBJECTIVE:                                                                                                                                                                                           SUBJECTIVE STATEMENT: ***  PERTINENT HISTORY:  HTN, HLD, chronic back pain, DM  PAIN:  Are you having pain? {OPRCPAIN:27236}  PRECAUTIONS: None  WEIGHT BEARING RESTRICTIONS: No  FALLS:  Has patient fallen in last  6 months? {fallsyesno:27318}  LIVING ENVIRONMENT: Lives with: {OPRC lives with:25569::"lives with their family"} Lives in: {Lives in:25570} Stairs: {opstairs:27293} Has following equipment at home: {Assistive devices:23999}  OCCUPATION: ***  PLOF: {PLOF:24004}  PATIENT GOALS: ***  NEXT MD VISIT:   OBJECTIVE:   DIAGNOSTIC FINDINGS:  XR 10/16 from MD note " spinal stenosis with abnormal coronal and sagittal plane alignment facet arthritis in the lower segments mild spondylolisthesis L4 on L5"  PATIENT SURVEYS:  {rehab surveys:24030}  SCREENING FOR RED FLAGS: Bowel or bladder incontinence: {UXL/KG:401027253} Spinal tumors: {Yes/No:304960894} Cauda equina syndrome: {Yes/No:304960894} Compression fracture: {Yes/No:304960894} Abdominal aneurysm: {Yes/No:304960894}  COGNITION: Overall cognitive status: {cognition:24006}     SENSATION: {sensation:27233}  MUSCLE LENGTH: Hamstrings: Right *** deg; Left *** deg Thomas test: Right *** deg; Left *** deg  POSTURE: {posture:25561}  PALPATION: ***  LUMBAR ROM:   AROM eval  Flexion   Extension   Right lateral flexion   Left lateral flexion   Right rotation   Left rotation    (Blank rows = not tested)  LOWER EXTREMITY ROM:     Active  Right eval Left eval  Hip flexion    Hip extension    Hip abduction    Hip adduction    Hip internal rotation     Hip external rotation    Knee flexion    Knee extension    Ankle dorsiflexion    Ankle plantarflexion    Ankle inversion    Ankle eversion     (Blank rows = not tested)  LOWER EXTREMITY MMT:    MMT Right eval Left eval  Hip flexion    Hip extension    Hip abduction    Hip adduction    Hip internal rotation    Hip external rotation    Knee flexion    Knee extension    Ankle dorsiflexion    Ankle plantarflexion    Ankle inversion    Ankle eversion     (Blank rows = not tested)  LUMBAR SPECIAL TESTS:  {lumbar special test:25242}  FUNCTIONAL TESTS:  {Functional tests:24029}  GAIT: Distance walked: *** Assistive device utilized: {Assistive devices:23999} Level of assistance: {Levels of assistance:24026} Comments: ***  TODAY'S TREATMENT:                                                                                                                              DATE:  03/25/22 ***    PATIENT EDUCATION:  Education details: Patient educated on exam findings, POC, scope of PT, HEP, and ***. Person educated: Patient Education method: Explanation, Demonstration, and Handouts Education comprehension: verbalized understanding, returned demonstration, verbal cues required, and tactile cues required   HOME EXERCISE PROGRAM: ***  ASSESSMENT:  CLINICAL IMPRESSION: Patient a 71 y.o. y.o. female who was seen today for physical therapy evaluation and treatment for LBP. Patient presents with pain limited deficits in lumbar spine and hip strength, ROM, endurance, activity tolerance, gait, and functional mobility with ADL.  Patient is having to modify and restrict ADL as indicated by outcome measure score as well as subjective information and objective measures which is affecting overall participation. Patient will benefit from skilled physical therapy in order to improve function and reduce impairment.   OBJECTIVE IMPAIRMENTS: Abnormal gait, decreased balance, decreased  endurance, decreased mobility, decreased ROM, decreased strength, increased muscle spasms, impaired flexibility, improper body mechanics, postural dysfunction, and pain.   ACTIVITY LIMITATIONS: carrying, lifting, bending, sitting, standing, squatting, stairs, transfers, reach over head, hygiene/grooming, locomotion level, and caring for others  PARTICIPATION LIMITATIONS: meal prep, cleaning, laundry, shopping, community activity, and yard work  PERSONAL FACTORS: Fitness, Time since onset of injury/illness/exacerbation, and 3+ comorbidities: HTN, HLD, chronic back pain, DM, increased BMI  are also affecting patient's functional outcome.   REHAB POTENTIAL: {rehabpotential:25112}  CLINICAL DECISION MAKING: {clinical decision making:25114}  EVALUATION COMPLEXITY: {Evaluation complexity:25115}   GOALS: Goals reviewed with patient? Yes  SHORT TERM GOALS: Target date: ***  Patient will be independent with HEP in order to improve functional outcomes. Baseline:  Goal status: INITIAL  2.  Patient will report at least 25% improvement in symptoms for improved quality of life. Baseline:  Goal status: INITIAL  3.  *** Baseline: *** Goal status: {GOALSTATUS:25110}  4.  *** Baseline: *** Goal status: {GOALSTATUS:25110}  5.  *** Baseline: *** Goal status: {GOALSTATUS:25110}  6.  *** Baseline: *** Goal status: {GOALSTATUS:25110}  LONG TERM GOALS: Target date: ***  Patient will report at least 75% improvement in symptoms for improved quality of life. Baseline:  Goal status: INITIAL  2.  Patient will improve FOTO score by at least *** points in order to indicate improved tolerance to activity. Baseline: *** Goal status: INITIAL  3.  Patient will demonstrate at least 25% improvement in lumbar ROM in all restricted planes for improved ability to move trunk while completing ***. Baseline: *** Goal status: INITIAL  4.  Patient will be able to ambulate at least *** feet in 2MWT in  order to demonstrate improved tolerance to activity. Baseline: *** Goal status: INITIAL  5.  Patient will demonstrate grade of 5/5 MMT grade in all tested musculature as evidence of improved strength to assist with stair ambulation and gait.   Baseline: *** Goal status: INITIAL  6.  *** Baseline: *** Goal status: {GOALSTATUS:25110}   PLAN:  PT FREQUENCY: {rehab frequency:25116}  PT DURATION: {rehab duration:25117}  PLANNED INTERVENTIONS: Therapeutic exercises, Therapeutic activity, Neuromuscular re-education, Balance training, Gait training, Patient/Family education, Joint manipulation, Joint mobilization, Stair training, Orthotic/Fit training, DME instructions, Aquatic Therapy, Dry Needling, Electrical stimulation, Spinal manipulation, Spinal mobilization, Cryotherapy, Moist heat, Compression bandaging, scar mobilization, Splintting, Taping, Traction, Ultrasound, Ionotophoresis '4mg'$ /ml Dexamethasone, and Manual therapy   PLAN FOR NEXT SESSION: ***   Vianne Bulls Emmali Karow, PT 03/25/2022, 7:15 AM

## 2022-03-29 ENCOUNTER — Other Ambulatory Visit: Payer: Self-pay | Admitting: Family Medicine

## 2022-03-31 ENCOUNTER — Other Ambulatory Visit: Payer: Self-pay | Admitting: Family Medicine

## 2022-04-05 ENCOUNTER — Ambulatory Visit: Payer: Medicare HMO | Admitting: Orthopedic Surgery

## 2022-04-14 ENCOUNTER — Encounter: Payer: No Typology Code available for payment source | Admitting: Family Medicine

## 2022-04-15 ENCOUNTER — Other Ambulatory Visit: Payer: Self-pay | Admitting: Family Medicine

## 2022-04-15 ENCOUNTER — Ambulatory Visit: Payer: Medicare HMO | Admitting: Orthopedic Surgery

## 2022-04-15 VITALS — BP 138/70 | HR 86 | Ht 66.0 in | Wt 222.0 lb

## 2022-04-15 DIAGNOSIS — M25552 Pain in left hip: Secondary | ICD-10-CM

## 2022-04-15 DIAGNOSIS — M25551 Pain in right hip: Secondary | ICD-10-CM | POA: Diagnosis not present

## 2022-04-15 DIAGNOSIS — M545 Low back pain, unspecified: Secondary | ICD-10-CM

## 2022-04-15 NOTE — Progress Notes (Signed)
Chief Complaint  Patient presents with   Follow-up    Recheck on back pain    Kathleen [name] is 71 years old she presented to [place] on October 16 with left hip pain right hip pain lower back pain and was felt to have a back issue at that time she was treated with physical therapy tizanidine and gabapentin she presents back now with no complaints  Recommendations are for her to continue with light exercise follow-up with [place] as needed

## 2022-04-23 ENCOUNTER — Other Ambulatory Visit: Payer: Self-pay | Admitting: Family Medicine

## 2022-05-04 ENCOUNTER — Other Ambulatory Visit: Payer: Self-pay | Admitting: Family Medicine

## 2022-05-17 ENCOUNTER — Other Ambulatory Visit: Payer: Self-pay | Admitting: Family Medicine

## 2022-05-18 ENCOUNTER — Encounter: Payer: No Typology Code available for payment source | Admitting: Family Medicine

## 2022-05-25 ENCOUNTER — Other Ambulatory Visit: Payer: Self-pay | Admitting: Family Medicine

## 2022-05-28 ENCOUNTER — Encounter: Payer: No Typology Code available for payment source | Admitting: Family Medicine

## 2022-06-08 ENCOUNTER — Other Ambulatory Visit: Payer: Self-pay | Admitting: Family Medicine

## 2022-06-15 ENCOUNTER — Other Ambulatory Visit: Payer: Self-pay | Admitting: Family Medicine

## 2022-06-21 ENCOUNTER — Other Ambulatory Visit: Payer: Self-pay | Admitting: Family Medicine

## 2022-06-29 ENCOUNTER — Telehealth: Payer: Self-pay | Admitting: Family Medicine

## 2022-06-29 ENCOUNTER — Other Ambulatory Visit: Payer: Self-pay

## 2022-06-29 DIAGNOSIS — J302 Other seasonal allergic rhinitis: Secondary | ICD-10-CM

## 2022-06-29 MED ORDER — MONTELUKAST SODIUM 10 MG PO TABS: ORAL_TABLET | ORAL | 0 refills | Status: DC

## 2022-06-29 NOTE — Telephone Encounter (Signed)
Montelukast sent please advice on mirtazipine if ok to send?

## 2022-06-29 NOTE — Telephone Encounter (Signed)
Patient needs refill on   mirtazapine (REMERON) 7.5 MG tablet   montelukast (SINGULAIR) 10 MG tablet    [facility] - [employer],  - [address] [place], [place] [address] Phone: [phone]  Fax: [phone]

## 2022-07-01 ENCOUNTER — Other Ambulatory Visit: Payer: Self-pay | Admitting: Family Medicine

## 2022-07-01 ENCOUNTER — Inpatient Hospital Stay: Payer: Medicare HMO | Attending: Hematology

## 2022-07-08 ENCOUNTER — Ambulatory Visit: Payer: No Typology Code available for payment source | Admitting: Physician Assistant

## 2022-07-12 ENCOUNTER — Other Ambulatory Visit: Payer: Self-pay | Admitting: Family Medicine

## 2022-07-19 ENCOUNTER — Other Ambulatory Visit: Payer: Self-pay | Admitting: Family Medicine

## 2022-07-19 DIAGNOSIS — J302 Other seasonal allergic rhinitis: Secondary | ICD-10-CM

## 2022-07-21 ENCOUNTER — Other Ambulatory Visit (HOSPITAL_COMMUNITY): Payer: Self-pay | Admitting: Family Medicine

## 2022-07-21 ENCOUNTER — Telehealth: Payer: Self-pay | Admitting: Family Medicine

## 2022-07-21 ENCOUNTER — Encounter: Payer: Self-pay | Admitting: Family Medicine

## 2022-07-21 ENCOUNTER — Ambulatory Visit (INDEPENDENT_AMBULATORY_CARE_PROVIDER_SITE_OTHER): Payer: Medicare HMO | Admitting: Family Medicine

## 2022-07-21 VITALS — BP 140/60 | HR 110 | Ht 66.0 in | Wt 214.0 lb

## 2022-07-21 DIAGNOSIS — M545 Low back pain, unspecified: Secondary | ICD-10-CM

## 2022-07-21 DIAGNOSIS — J302 Other seasonal allergic rhinitis: Secondary | ICD-10-CM

## 2022-07-21 DIAGNOSIS — I10 Essential (primary) hypertension: Secondary | ICD-10-CM | POA: Diagnosis not present

## 2022-07-21 DIAGNOSIS — E559 Vitamin D deficiency, unspecified: Secondary | ICD-10-CM

## 2022-07-21 DIAGNOSIS — N6310 Unspecified lump in the right breast, unspecified quadrant: Secondary | ICD-10-CM

## 2022-07-21 DIAGNOSIS — E790 Hyperuricemia without signs of inflammatory arthritis and tophaceous disease: Secondary | ICD-10-CM | POA: Diagnosis not present

## 2022-07-21 DIAGNOSIS — M48061 Spinal stenosis, lumbar region without neurogenic claudication: Secondary | ICD-10-CM | POA: Diagnosis not present

## 2022-07-21 DIAGNOSIS — M25552 Pain in left hip: Secondary | ICD-10-CM

## 2022-07-21 DIAGNOSIS — Z0001 Encounter for general adult medical examination with abnormal findings: Secondary | ICD-10-CM

## 2022-07-21 DIAGNOSIS — E1169 Type 2 diabetes mellitus with other specified complication: Secondary | ICD-10-CM

## 2022-07-21 DIAGNOSIS — E7849 Other hyperlipidemia: Secondary | ICD-10-CM | POA: Diagnosis not present

## 2022-07-21 DIAGNOSIS — Z1231 Encounter for screening mammogram for malignant neoplasm of breast: Secondary | ICD-10-CM

## 2022-07-21 DIAGNOSIS — N1832 Chronic kidney disease, stage 3b: Secondary | ICD-10-CM

## 2022-07-21 DIAGNOSIS — M25551 Pain in right hip: Secondary | ICD-10-CM

## 2022-07-21 MED ORDER — AMLODIPINE BESYLATE 10 MG PO TABS: ORAL_TABLET | ORAL | 3 refills | Status: DC

## 2022-07-21 MED ORDER — GABAPENTIN 100 MG PO CAPS: 100.0000 mg | ORAL_CAPSULE | Freq: Three times a day (TID) | ORAL | 5 refills | Status: DC

## 2022-07-21 MED ORDER — APIXABAN 5 MG PO TABS: ORAL_TABLET | ORAL | 11 refills | Status: DC

## 2022-07-21 MED ORDER — MONTELUKAST SODIUM 10 MG PO TABS: ORAL_TABLET | ORAL | 5 refills | Status: DC

## 2022-07-21 MED ORDER — MIRTAZAPINE 7.5 MG PO TABS: ORAL_TABLET | ORAL | 5 refills | Status: DC

## 2022-07-21 MED ORDER — HYDROCHLOROTHIAZIDE 25 MG PO TABS: 25.0000 mg | ORAL_TABLET | Freq: Every day | ORAL | 3 refills | Status: DC

## 2022-07-21 NOTE — Telephone Encounter (Signed)
Per Manuela Schwartz in radiology pt is due for DX mammo not screen. If she does mammo now they can only do the right side. If she goes after 10/29/22 then a dx mammo can be done bilateral. Pt states she never follow up with surgeon because she did not want them going back into her breast. Pt just wants mammo. Can you please contact patient & let her know what she needs to do? She asked to speak with Dr. Camillia Herter first to see what she needs.

## 2022-07-21 NOTE — Patient Instructions (Addendum)
F/U in 6 to 8 weeks ,  re evaluate blood peressure , call if you need me sooner  Labs today and nurse will call tomorrow with results and med changes needed  CBC, lipid, cmp and EGFr, HBA1C, TSH microalb/urea, Vit D, Uric acid  Please sched mammogram at checkout  Pls reconsider vaccins, they are recommended  Pl sched and keep appt with Nephrology  It is important that you exercise regularly at least 30 minutes 5 times a week. If you develop chest pain, have severe difficulty breathing, or feel very tired, stop exercising immediately and seek medical attention   Thanks for choosing [facility], we consider it a privelige to serve you.

## 2022-07-22 ENCOUNTER — Other Ambulatory Visit: Payer: Self-pay

## 2022-07-22 DIAGNOSIS — R928 Other abnormal and inconclusive findings on diagnostic imaging of breast: Secondary | ICD-10-CM

## 2022-07-22 NOTE — Telephone Encounter (Signed)
Correct mammo entered for patient

## 2022-07-24 LAB — CMP14+EGFR
ALT: 24 IU/L (ref 0–32)
AST: 26 IU/L (ref 0–40)
Albumin/Globulin Ratio: 1.2 (ref 1.2–2.2)
Albumin: 4.1 g/dL (ref 3.8–4.8)
Alkaline Phosphatase: 136 IU/L — ABNORMAL HIGH (ref 44–121)
BUN/Creatinine Ratio: 12 (ref 12–28)
BUN: 18 mg/dL (ref 8–27)
Bilirubin Total: 0.2 mg/dL (ref 0.0–1.2)
CO2: 25 mmol/L (ref 20–29)
Calcium: 9.7 mg/dL (ref 8.7–10.3)
Chloride: 99 mmol/L (ref 96–106)
Creatinine, Ser: 1.5 mg/dL — ABNORMAL HIGH (ref 0.57–1.00)
Globulin, Total: 3.5 g/dL (ref 1.5–4.5)
Glucose: 161 mg/dL — ABNORMAL HIGH (ref 70–99)
Potassium: 3.8 mmol/L (ref 3.5–5.2)
Sodium: 141 mmol/L (ref 134–144)
Total Protein: 7.6 g/dL (ref 6.0–8.5)
eGFR: 37 mL/min/{1.73_m2} — ABNORMAL LOW (ref >59)

## 2022-07-24 LAB — LIPID PANEL
Chol/HDL Ratio: 2.5 ratio (ref 0.0–4.4)
Cholesterol, Total: 91 mg/dL — ABNORMAL LOW (ref 100–199)
HDL: 36 mg/dL — ABNORMAL LOW (ref >39)
LDL Chol Calc (NIH): 40 mg/dL (ref 0–99)
Triglycerides: 72 mg/dL (ref 0–149)
VLDL Cholesterol Cal: 15 mg/dL (ref 5–40)

## 2022-07-24 LAB — CBC
Hematocrit: 37.6 % (ref 34.0–46.6)
Hemoglobin: 11.5 g/dL (ref 11.1–15.9)
MCH: 24.3 pg — ABNORMAL LOW (ref 26.6–33.0)
MCHC: 30.6 g/dL — ABNORMAL LOW (ref 31.5–35.7)
MCV: 79 fL (ref 79–97)
Platelets: 318 10*3/uL (ref 150–450)
RBC: 4.74 x10E6/uL (ref 3.77–5.28)
RDW: 15 % (ref 11.7–15.4)
WBC: 7 10*3/uL (ref 3.4–10.8)

## 2022-07-24 LAB — HEMOGLOBIN A1C
Est. average glucose Bld gHb Est-mCnc: 232 mg/dL
Hgb A1c MFr Bld: 9.7 % — ABNORMAL HIGH (ref 4.8–5.6)

## 2022-07-24 LAB — TSH: TSH: 2.22 u[IU]/mL (ref 0.450–4.500)

## 2022-07-24 LAB — MICROALBUMIN / CREATININE URINE RATIO
Creatinine, Urine: 145.9 mg/dL
Microalb/Creat Ratio: 34 mg/g creat — ABNORMAL HIGH (ref 0–29)
Microalbumin, Urine: 50.1 ug/mL

## 2022-07-24 LAB — VITAMIN D 25 HYDROXY (VIT D DEFICIENCY, FRACTURES): Vit D, 25-Hydroxy: 38.6 ng/mL (ref 30.0–100.0)

## 2022-07-24 LAB — URIC ACID: Uric Acid: 8.3 mg/dL — ABNORMAL HIGH (ref 3.1–7.9)

## 2022-07-25 ENCOUNTER — Encounter: Payer: Self-pay | Admitting: Family Medicine

## 2022-07-25 DIAGNOSIS — E559 Vitamin D deficiency, unspecified: Secondary | ICD-10-CM | POA: Insufficient documentation

## 2022-07-25 MED ORDER — GLIPIZIDE ER 5 MG PO TB24: 5.0000 mg | ORAL_TABLET | Freq: Every day | ORAL | 1 refills | Status: DC

## 2022-07-25 MED ORDER — GLIPIZIDE ER 2.5 MG PO TB24: 2.5000 mg | ORAL_TABLET | Freq: Every day | ORAL | 1 refills | Status: DC

## 2022-07-25 NOTE — Assessment & Plan Note (Signed)
Updated lab needed adequately  controlled

## 2022-07-25 NOTE — Assessment & Plan Note (Signed)

## 2022-07-25 NOTE — Assessment & Plan Note (Signed)
Check level, elevated, needs to take daily allopurinol

## 2022-07-25 NOTE — Assessment & Plan Note (Signed)
Uncontrolled Med and dietary complance a challenge Additional glipizide 5 mg daily to be started Recommend nutrition ednot seeming very interested at visit Close f/u needed Kathleen [name] is reminded of the importance of commitment to daily physical activity for 30 minutes or more, as able and the need to limit carbohydrate intake to 30 to 60 grams per meal to help with blood sugar control.   The need to take medication as prescribed, test blood sugar as directed, and to call between visits if there is a concern that blood sugar is uncontrolled is also discussed.   Kathleen [name] is reminded of the importance of daily foot exam, annual eye examination, and good blood sugar, blood pressure and cholesterol control.     Latest Ref Rng & Units 07/21/2022    9:25 AM 02/04/2022    2:33 PM 01/08/2022   10:30 AM 12/11/2021   11:08 AM 09/17/2021    2:56 PM  Diabetic Labs  HbA1c 4.8 - 5.6 % 9.7   9.8   6.9   Micro/Creat Ratio 0 - 29 mg/g creat 34       Chol 100 - 199 mg/dL 91   207   91   HDL >39 mg/dL 36   42   35   Calc LDL 0 - 99 mg/dL 40   143   33   Triglycerides 0 - 149 mg/dL 72   122   129   Creatinine 0.57 - 1.00 mg/dL 1.50  1.46   2.93  1.65       07/21/2022    8:56 AM 07/21/2022    8:23 AM 07/21/2022    8:19 AM 04/15/2022    1:32 PM 02/22/2022   11:10 AM 02/17/2022    1:09 PM 02/04/2022    2:13 PM  BP/Weight  Systolic BP XX123456 XX123456 Q000111Q 0000000 XX123456 123456 [id number]  Diastolic BP 60 72 67 70 93 85 80  Wt. (Lbs)   214.04 222 222 223   BMI   34.55 kg/m2 35.83 kg/m2 35.83 kg/m2 35.99 kg/m2       Latest Ref Rng & Units 09/17/2021    1:40 PM 07/15/2021   12:00 AM  Foot/eye exam completion dates  Eye Exam No Retinopathy  No Retinopathy      Foot Form Completion  Done      This result is from an external source.

## 2022-07-25 NOTE — Progress Notes (Signed)
Kathleen [name]     MRN: [id number]      DOB: 17-Jun-1950  HPI: Patient is in for annual physical exam. Labs drawn on day of visit are reviewed and me ajustments made based on results Immunization is reviewed , and  updated if needed.   PE: BP (!) 140/60   Pulse (!) 110   Ht 5\' 6"  (1.676 m)   Wt 214 lb 0.6 oz (97.1 kg)   SpO2 92%   BMI 34.55 kg/m   Pleasant  female, alert and oriented x 3, in no cardio-pulmonary distress. Afebrile. HEENT No facial trauma or asymetry. Sinuses non tender.  Extra occullar muscles intact.. External ears normal, . Neck: decreased ROM, no adenopathy,JVD or thyromegaly.No bruits.  Chest: Clear to ascultation bilaterally.No crackles or wheezes. Non tender to palpation  Cardiovascular system; Heart sounds normal,  S1 and  S2 ,no S3.  No murmur, or thrill.  Peripheral pulses normal.  Abdomen: Soft, non tender, no organomegaly or masses. Bowel sounds normal. No guarding, tenderness or rebound.    Musculoskeletal exam: Decreased though adequate  ROM of spine, hips , shoulders and knees.  deformity ,swelling and crepitus noted. No muscle wasting or atrophy.   Neurologic: Cranial nerves 2 to 12 intact. Power, tone ,sensation and reflexes normal throughout. No disturbance in gait. No tremor.  Skin: Intact, no ulceration, erythema , scaling or rash noted. Pigmentation normal throughout  Psych; Normal mood and affect. Judgement and concentration normal   Assessment & Plan:  Annual visit for general adult medical examination with abnormal findings Annual exam as documented. Counseling done  re healthy lifestyle involving commitment to 150 minutes exercise per week, heart healthy diet, and attaining healthy weight.The importance of adequate sleep also discussed. Regular seat belt use and home safety, is also discussed. Changes in health habits are decided on by the patient with goals and time frames  set for achieving  them. Immunization and cancer screening needs are specifically addressed at this visit.   Hyperuricemia Check level, elevated, needs to take daily allopurinol  Hyperlipidemia Hyperlipidemia:Low fat diet discussed and encouraged.   Lipid Panel  Lab Results  Component Value Date   CHOL 91 (L) 07/21/2022   HDL 36 (L) 07/21/2022   LDLCALC 40 07/21/2022   TRIG 72 07/21/2022   CHOLHDL 2.5 07/21/2022     No med change, needs to increase exercise  Chronic kidney disease, stage 3 unspecified ([place]) Needs to keep appt with Nephrology  Type 2 diabetes mellitus ([place]) Uncontrolled Med and dietary complance a challenge Additional glipizide 5 mg daily to be started Recommend nutrition ednot seeming very interested at visit Close f/u needed Ms. Phang is reminded of the importance of commitment to daily physical activity for 30 minutes or more, as able and the need to limit carbohydrate intake to 30 to 60 grams per meal to help with blood sugar control.   The need to take medication as prescribed, test blood sugar as directed, and to call between visits if there is a concern that blood sugar is uncontrolled is also discussed.   Ms. Molt is reminded of the importance of daily foot exam, annual eye examination, and good blood sugar, blood pressure and cholesterol control.     Latest Ref Rng & Units 07/21/2022    9:25 AM 02/04/2022    2:33 PM 01/08/2022   10:30 AM 12/11/2021   11:08 AM 09/17/2021    2:56 PM  Diabetic Labs  HbA1c 4.8 - 5.6 %  9.7   9.8   6.9   Micro/Creat Ratio 0 - 29 mg/g creat 34       Chol 100 - 199 mg/dL 91   207   91   HDL >39 mg/dL 36   42   35   Calc LDL 0 - 99 mg/dL 40   143   33   Triglycerides 0 - 149 mg/dL 72   122   129   Creatinine 0.57 - 1.00 mg/dL 1.50  1.46   2.93  1.65       07/21/2022    8:56 AM 07/21/2022    8:23 AM 07/21/2022    8:19 AM 04/15/2022    1:32 PM 02/22/2022   11:10 AM 02/17/2022    1:09 PM 02/04/2022    2:13 PM  BP/Weight  Systolic BP  XX123456 XX123456 Q000111Q 0000000 XX123456 123456 [id number]  Diastolic BP 60 72 67 70 93 85 80  Wt. (Lbs)   214.04 222 222 223   BMI   34.55 kg/m2 35.83 kg/m2 35.83 kg/m2 35.99 kg/m2       Latest Ref Rng & Units 09/17/2021    1:40 PM 07/15/2021   12:00 AM  Foot/eye exam completion dates  Eye Exam No Retinopathy  No Retinopathy      Foot Form Completion  Done      This result is from an external source.        Essential hypertension DASH diet and commitment to daily physical activity for a minimum of 30 minutes discussed and encouraged, as a part of hypertension management. The importance of attaining a healthy weight is also discussed.     07/21/2022    8:56 AM 07/21/2022    8:23 AM 07/21/2022    8:19 AM 04/15/2022    1:32 PM 02/22/2022   11:10 AM 02/17/2022    1:09 PM 02/04/2022    2:13 PM  BP/Weight  Systolic BP XX123456 XX123456 Q000111Q 0000000 XX123456 123456 [id number]  Diastolic BP 60 72 67 70 93 85 80  Wt. (Lbs)   214.04 222 222 223   BMI   34.55 kg/m2 35.83 kg/m2 35.83 kg/m2 35.99 kg/m2      Elevated SBP, educe salt intake , no med chamge  Vitamin D deficiency Updated lab needed adequately  controlled  Morbid obesity ([place])  Patient re-educated about  the importance of commitment to a  minimum of 150 minutes of exercise per week as able.  The importance of healthy food choices with portion control discussed, as well as eating regularly and within a 12 hour window most days. The need to choose "clean , green" food 50 to 75% of the time is discussed, as well as to make water the primary drink and set a goal of 64 ounces water daily.       07/21/2022    8:19 AM 04/15/2022    1:32 PM 02/22/2022   11:10 AM  Weight /BMI  Weight 214 lb 0.6 oz 222 lb 222 lb  Height 5\' 6"  (1.676 m) 5\' 6"  (1.676 m) 5\' 6"  (1.676 m)  BMI 34.55 kg/m2 35.83 kg/m2 35.83 kg/m2

## 2022-07-25 NOTE — Assessment & Plan Note (Signed)
DASH diet and commitment to daily physical activity for a minimum of 30 minutes discussed and encouraged, as a part of hypertension management. The importance of attaining a healthy weight is also discussed.     07/21/2022    8:56 AM 07/21/2022    8:23 AM 07/21/2022    8:19 AM 04/15/2022    1:32 PM 02/22/2022   11:10 AM 02/17/2022    1:09 PM 02/04/2022    2:13 PM  BP/Weight  Systolic BP XX123456 XX123456 Q000111Q 0000000 XX123456 123456 [id number]  Diastolic BP 60 72 67 70 93 85 80  Wt. (Lbs)   214.04 222 222 223   BMI   34.55 kg/m2 35.83 kg/m2 35.83 kg/m2 35.99 kg/m2      Elevated SBP, educe salt intake , no med chamge

## 2022-07-25 NOTE — Assessment & Plan Note (Signed)
Hyperlipidemia:Low fat diet discussed and encouraged.   Lipid Panel  Lab Results  Component Value Date   CHOL 91 (L) 07/21/2022   HDL 36 (L) 07/21/2022   LDLCALC 40 07/21/2022   TRIG 72 07/21/2022   CHOLHDL 2.5 07/21/2022     No med change, needs to increase exercise

## 2022-07-25 NOTE — Assessment & Plan Note (Signed)
  Patient re-educated about  the importance of commitment to a  minimum of 150 minutes of exercise per week as able.  The importance of healthy food choices with portion control discussed, as well as eating regularly and within a 12 hour window most days. The need to choose "clean , green" food 50 to 75% of the time is discussed, as well as to make water the primary drink and set a goal of 64 ounces water daily.       07/21/2022    8:19 AM 04/15/2022    1:32 PM 02/22/2022   11:10 AM  Weight /BMI  Weight 214 lb 0.6 oz 222 lb 222 lb  Height 5\' 6"  (1.676 m) 5\' 6"  (1.676 m) 5\' 6"  (1.676 m)  BMI 34.55 kg/m2 35.83 kg/m2 35.83 kg/m2

## 2022-07-25 NOTE — Assessment & Plan Note (Signed)
Needs to keep appt with Nephrology

## 2022-07-28 MED ORDER — GLIPIZIDE ER 5 MG PO TB24: 5.0000 mg | ORAL_TABLET | Freq: Every day | ORAL | 5 refills | Status: DC

## 2022-07-28 MED ORDER — ALLOPURINOL 100 MG PO TABS: 100.0000 mg | ORAL_TABLET | Freq: Every day | ORAL | 6 refills | Status: DC

## 2022-07-28 NOTE — Addendum Note (Signed)
Addended by: Fayrene Helper on: 07/28/2022 09:31 PM   Modules accepted: Orders

## 2022-08-02 ENCOUNTER — Other Ambulatory Visit: Payer: Self-pay | Admitting: Family Medicine

## 2022-08-12 ENCOUNTER — Ambulatory Visit (HOSPITAL_COMMUNITY): Payer: Medicare HMO

## 2022-08-12 ENCOUNTER — Encounter (HOSPITAL_COMMUNITY): Payer: Medicare HMO

## 2022-08-25 ENCOUNTER — Other Ambulatory Visit: Payer: Self-pay | Admitting: Family Medicine

## 2022-08-26 ENCOUNTER — Ambulatory Visit (HOSPITAL_COMMUNITY): Payer: Medicare HMO

## 2022-08-26 ENCOUNTER — Encounter (HOSPITAL_COMMUNITY): Payer: Medicare HMO

## 2022-09-03 ENCOUNTER — Other Ambulatory Visit: Payer: Self-pay | Admitting: Family Medicine

## 2022-09-09 ENCOUNTER — Ambulatory Visit (HOSPITAL_COMMUNITY)
Admission: RE | Admit: 2022-09-09 | Discharge: 2022-09-09 | Disposition: A | Discharge status: Home / Self Care | Payer: Medicare HMO | Source: Ambulatory Visit | Attending: Family Medicine | Admitting: Family Medicine

## 2022-09-09 DIAGNOSIS — R92321 Mammographic fibroglandular density, right breast: Secondary | ICD-10-CM | POA: Diagnosis not present

## 2022-09-09 DIAGNOSIS — N6315 Unspecified lump in the right breast, overlapping quadrants: Secondary | ICD-10-CM | POA: Diagnosis not present

## 2022-09-09 DIAGNOSIS — R928 Other abnormal and inconclusive findings on diagnostic imaging of breast: Secondary | ICD-10-CM | POA: Diagnosis not present

## 2022-09-09 DIAGNOSIS — N6310 Unspecified lump in the right breast, unspecified quadrant: Secondary | ICD-10-CM | POA: Diagnosis not present

## 2022-09-15 ENCOUNTER — Ambulatory Visit: Payer: Medicare HMO | Admitting: Family Medicine

## 2022-09-16 ENCOUNTER — Other Ambulatory Visit: Payer: Self-pay | Admitting: Family Medicine

## 2022-09-24 ENCOUNTER — Ambulatory Visit: Payer: Medicare HMO | Admitting: Family Medicine

## 2022-10-01 ENCOUNTER — Ambulatory Visit (INDEPENDENT_AMBULATORY_CARE_PROVIDER_SITE_OTHER): Payer: Medicare HMO | Admitting: Family Medicine

## 2022-10-01 ENCOUNTER — Ambulatory Visit: Payer: Medicare HMO | Admitting: Family Medicine

## 2022-10-01 ENCOUNTER — Encounter: Payer: Self-pay | Admitting: Family Medicine

## 2022-10-01 VITALS — BP 142/78 | HR 104 | Ht 66.0 in | Wt 208.0 lb

## 2022-10-01 DIAGNOSIS — E785 Hyperlipidemia, unspecified: Secondary | ICD-10-CM

## 2022-10-01 DIAGNOSIS — E1169 Type 2 diabetes mellitus with other specified complication: Secondary | ICD-10-CM

## 2022-10-01 DIAGNOSIS — N1832 Chronic kidney disease, stage 3b: Secondary | ICD-10-CM | POA: Diagnosis not present

## 2022-10-01 DIAGNOSIS — E669 Obesity, unspecified: Secondary | ICD-10-CM

## 2022-10-01 DIAGNOSIS — I10 Essential (primary) hypertension: Secondary | ICD-10-CM | POA: Diagnosis not present

## 2022-10-01 DIAGNOSIS — E559 Vitamin D deficiency, unspecified: Secondary | ICD-10-CM | POA: Diagnosis not present

## 2022-10-01 DIAGNOSIS — I2699 Other pulmonary embolism without acute cor pulmonale: Secondary | ICD-10-CM | POA: Diagnosis not present

## 2022-10-01 DIAGNOSIS — E790 Hyperuricemia without signs of inflammatory arthritis and tophaceous disease: Secondary | ICD-10-CM | POA: Diagnosis not present

## 2022-10-01 MED ORDER — HYDRALAZINE HCL 50 MG PO TABS: 50.0000 mg | ORAL_TABLET | Freq: Three times a day (TID) | ORAL | 2 refills | Status: DC

## 2022-10-01 MED ORDER — OLMESARTAN MEDOXOMIL 20 MG PO TABS: 20.0000 mg | ORAL_TABLET | Freq: Every day | ORAL | 2 refills | Status: DC

## 2022-10-01 NOTE — Patient Instructions (Signed)
F/u 2nd week in July, call if you need me sooner, foot exam at visit  Please get non fasting labs first week in July  HBA1C and chem 7 and EGFR and uric acid level to be done first week in July   For blood pressure Olmesartan 20 mg one daily ( need to get this) Amlodipine 10 mg one daily Hydrallazine 50 mg three times daily  ( need to get this) Furosemide 20 mg two times daily  STOP HCTZ for blood pressure  STOP clonidine  Thanks for choosing [facility], we consider it a privelige to serve you.

## 2022-10-02 ENCOUNTER — Encounter: Payer: Self-pay | Admitting: Family Medicine

## 2022-10-02 NOTE — Assessment & Plan Note (Signed)
Updated lab needed at/ before next visit.   

## 2022-10-02 NOTE — Assessment & Plan Note (Signed)
Improved though not at goal, medication management reviewed with Kathleen [name] who has [facility] and adjustments are made.  She will follow-up in 4 to 6 weeks. DASH diet and commitment to daily physical activity for a minimum of 30 minutes discussed and encouraged, as a part of hypertension management. The importance of attaining a healthy weight is also discussed.     10/01/2022    2:37 PM 10/01/2022    2:36 PM 07/21/2022    8:56 AM 07/21/2022    8:23 AM 07/21/2022    8:19 AM 04/15/2022    1:32 PM 02/22/2022   11:10 AM  BP/Weight  Systolic BP 142 158 140 140 147 138 168  Diastolic BP 78 63 60 72 67 70 93  Wt. (Lbs)  208   214.04 222 222  BMI  33.57 kg/m2   34.55 kg/m2 35.83 kg/m2 35.83 kg/m2

## 2022-10-02 NOTE — Assessment & Plan Note (Signed)
Controlled, no change in medication Hyperlipidemia:Low fat diet discussed and encouraged.   Lipid Panel  Lab Results  Component Value Date   CHOL 91 (L) 07/21/2022   HDL 36 (L) 07/21/2022   LDLCALC 40 07/21/2022   TRIG 72 07/21/2022   CHOLHDL 2.5 07/21/2022

## 2022-10-02 NOTE — Assessment & Plan Note (Signed)
Lifelong eliquis, no complications with this

## 2022-10-02 NOTE — Assessment & Plan Note (Signed)
[name] is reminded of the importance of commitment to daily physical activity for 30 minutes or more, as able and the need to limit carbohydrate intake to 30 to 60 grams per meal to help with blood sugar control.   The need to take medication as prescribed, test blood sugar as directed, and to call between visits if there is a concern that blood sugar is uncontrolled is also discussed.   [name] is reminded of the importance of daily foot exam, annual eye examination, and good blood sugar, blood pressure and cholesterol control.     Latest Ref Rng & Units 07/21/2022    9:25 AM 02/04/2022    2:33 PM 01/08/2022   10:30 AM 12/11/2021   11:08 AM 09/17/2021    2:56 PM  Diabetic Labs  HbA1c 4.8 - 5.6 % 9.7   9.8   6.9   Micro/Creat Ratio 0 - 29 mg/g creat 34       Chol 100 - 199 mg/dL 91   161   91   HDL >09 mg/dL 36   42   35   Calc LDL 0 - 99 mg/dL 40   604   33   Triglycerides 0 - 149 mg/dL 72   540   981   Creatinine 0.57 - 1.00 mg/dL 1.91  4.78   2.95  6.21       10/01/2022    2:37 PM 10/01/2022    2:36 PM 07/21/2022    8:56 AM 07/21/2022    8:23 AM 07/21/2022    8:19 AM 04/15/2022    1:32 PM 02/22/2022   11:10 AM  BP/Weight  Systolic BP 142 158 140 140 147 138 168  Diastolic BP 78 63 60 72 67 70 93  Wt. (Lbs)  208   214.04 222 222  BMI  33.57 kg/m2   34.55 kg/m2 35.83 kg/m2 35.83 kg/m2      Latest Ref Rng & Units 09/17/2021    1:40 PM 07/15/2021   12:00 AM  Foot/eye exam completion dates  Eye Exam No Retinopathy  No Retinopathy      Foot Form Completion  Done      This result is from an external source.      Updated lab needed at/ before next visit.

## 2022-10-02 NOTE — Assessment & Plan Note (Signed)
  Patient re-educated about  the importance of commitment to a  minimum of 150 minutes of exercise per week as able.  The importance of healthy food choices with portion control discussed, as well as eating regularly and within a 12 hour window most days. The need to choose "clean , green" food 50 to 75% of the time is discussed, as well as to make water the primary drink and set a goal of 64 ounces water daily.       10/01/2022    2:36 PM 07/21/2022    8:19 AM 04/15/2022    1:32 PM  Weight /BMI  Weight 208 lb 214 lb 0.6 oz 222 lb  Height 5\' 6"  (1.676 m) 5\' 6"  (1.676 m) 5\' 6"  (1.676 m)  BMI 33.57 kg/m2 34.55 kg/m2 35.83 kg/m2

## 2022-10-02 NOTE — Progress Notes (Signed)
Kathleen [name]     MRN: [id number]      DOB: November 18, 1950  Chief Complaint  Patient presents with   Hypertension    Patient is here for HTN f/u. No changes or concerns since last visit.     HPI Kathleen [name] is here for follow up and re-evaluation of chronic medical conditions, medication management and review of any available recent lab and radiology data.  Preventive health is updated, specifically  Cancer screening and Immunization.   Questions or concerns regarding consultations or procedures which the PT has had in the interim are  addressed. The PT denies any adverse reactions to current medications since the last visit.  Denies lwight headedness Denies cough Denies polyuria, polydipsia, blurred vision , or hypoglycemic episodes.   ROS Denies recent fever or chills. Denies sinus pressure, nasal congestion, ear pain or sore throat. Denies chest congestion, productive cough or wheezing. Denies chest pains, palpitations and leg swelling Denies abdominal pain, nausea, vomiting,diarrhea or constipation.   Denies dysuria, frequency, hesitancy or incontinence. Chronic  joint pain, swelling and limitation in mobility. Denies headaches, seizures, numbness, or tingling. Denies depression, anxiety or insomnia. Denies skin break down or rash.   PE  BP (!) 142/78   Pulse (!) 104   Ht 5\' 6"  (1.676 m)   Wt 208 lb (94.3 kg)   SpO2 95%   BMI 33.57 kg/m   Patient alert and oriented and in no cardiopulmonary distress.  HEENT: No facial asymmetry, EOMI,     Neck supple .  Chest: Clear to auscultation bilaterally.  CVS: S1, S2 no murmurs, no S3.Regular rate.  ABD: Soft non tender.   Ext: No edema  MS: Adequate though reduced  ROM spine, shoulders, hips and knees.  Skin: Intact, no ulcerations or rash noted.  Psych: Good eye contact, normal affect. Memory intact not anxious or depressed appearing.  CNS: CN 2-12 intact, power,  normal throughout.no focal deficits  noted.   Assessment & Plan  Acute massive pulmonary embolism (HCC) Lifelong eliquis, no complications with this  Essential hypertension Improved though not at goal, medication management reviewed with Kathleen [name] who has [facility] and adjustments are made.  She will follow-up in 4 to 6 weeks. DASH diet and commitment to daily physical activity for a minimum of 30 minutes discussed and encouraged, as a part of hypertension management. The importance of attaining a healthy weight is also discussed.     10/01/2022    2:37 PM 10/01/2022    2:36 PM 07/21/2022    8:56 AM 07/21/2022    8:23 AM 07/21/2022    8:19 AM 04/15/2022    1:32 PM 02/22/2022   11:10 AM  BP/Weight  Systolic BP 142 158 140 140 147 138 168  Diastolic BP 78 63 60 72 67 70 93  Wt. (Lbs)  208   214.04 222 222  BMI  33.57 kg/m2   34.55 kg/m2 35.83 kg/m2 35.83 kg/m2       Hyperlipidemia LDL goal <100 Controlled, no change in medication Hyperlipidemia:Low fat diet discussed and encouraged.   Lipid Panel  Lab Results  Component Value Date   CHOL 91 (L) 07/21/2022   HDL 36 (L) 07/21/2022   LDLCALC 40 07/21/2022   TRIG 72 07/21/2022   CHOLHDL 2.5 07/21/2022       Hyperuricemia Updated lab needed at/ before next visit.   Type 2 diabetes mellitus (HCC) Kathleen [name] is reminded of the importance of commitment to daily physical activity for 30  minutes or more, as able and the need to limit carbohydrate intake to 30 to 60 grams per meal to help with blood sugar control.   The need to take medication as prescribed, test blood sugar as directed, and to call between visits if there is a concern that blood sugar is uncontrolled is also discussed.   Kathleen [name] is reminded of the importance of daily foot exam, annual eye examination, and good blood sugar, blood pressure and cholesterol control.     Latest Ref Rng & Units 07/21/2022    9:25 AM 02/04/2022    2:33 PM 01/08/2022   10:30 AM 12/11/2021   11:08 AM 09/17/2021    2:56  PM  Diabetic Labs  HbA1c 4.8 - 5.6 % 9.7   9.8   6.9   Micro/Creat Ratio 0 - 29 mg/g creat 34       Chol 100 - 199 mg/dL 91   161   91   HDL >09 mg/dL 36   42   35   Calc LDL 0 - 99 mg/dL 40   604   33   Triglycerides 0 - 149 mg/dL 72   540   981   Creatinine 0.57 - 1.00 mg/dL 1.91  4.78   2.95  6.21       10/01/2022    2:37 PM 10/01/2022    2:36 PM 07/21/2022    8:56 AM 07/21/2022    8:23 AM 07/21/2022    8:19 AM 04/15/2022    1:32 PM 02/22/2022   11:10 AM  BP/Weight  Systolic BP 142 158 140 140 147 138 168  Diastolic BP 78 63 60 72 67 70 93  Wt. (Lbs)  208   214.04 222 222  BMI  33.57 kg/m2   34.55 kg/m2 35.83 kg/m2 35.83 kg/m2      Latest Ref Rng & Units 09/17/2021    1:40 PM 07/15/2021   12:00 AM  Foot/eye exam completion dates  Eye Exam No Retinopathy  No Retinopathy      Foot Form Completion  Done      This result is from an external source.      Updated lab needed at/ before next visit.   Vitamin D deficiency Adequately treated   Obesity (BMI 30.0-34.9)  Patient re-educated about  the importance of commitment to a  minimum of 150 minutes of exercise per week as able.  The importance of healthy food choices with portion control discussed, as well as eating regularly and within a 12 hour window most days. The need to choose "clean , green" food 50 to 75% of the time is discussed, as well as to make water the primary drink and set a goal of 64 ounces water daily.       10/01/2022    2:36 PM 07/21/2022    8:19 AM 04/15/2022    1:32 PM  Weight /BMI  Weight 208 lb 214 lb 0.6 oz 222 lb  Height 5\' 6"  (1.676 m) 5\' 6"  (1.676 m) 5\' 6"  (1.676 m)  BMI 33.57 kg/m2 34.55 kg/m2 35.83 kg/m2

## 2022-10-02 NOTE — Assessment & Plan Note (Signed)
Adequately treated 

## 2022-10-03 IMAGING — CT CT CHEST W/O CM
2 of 4 series · 15 of 36 positions shown, 18 images · non-contrast
Comparison: 02/01/2020

CLINICAL DATA: Left-sided pulmonary nodule.

EXAM:
CT CHEST WITHOUT CONTRAST
TECHNIQUE: Multidetector CT imaging of the chest was performed following the
standard protocol without IV contrast.

[Series 2: routine chest without · axial · non-contrast · 0.73mm/px · z∈[-131,+105]mm · 12 of 140 slices shown, 15 images]
[im 11/140  mediastinal]
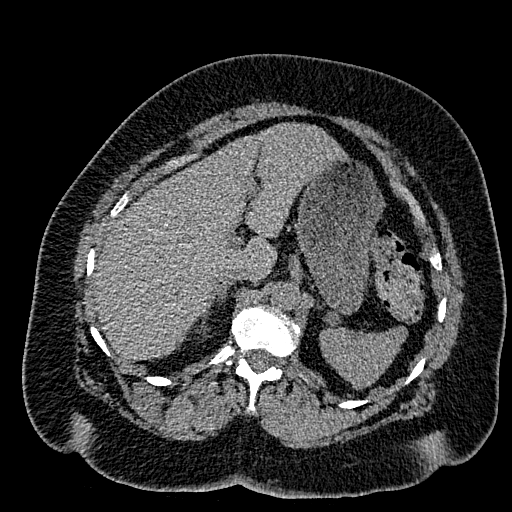
[im 11/140  lung]
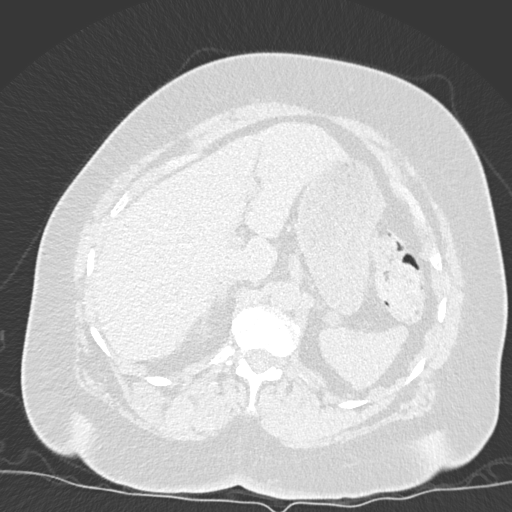
[im 22/140  lung]
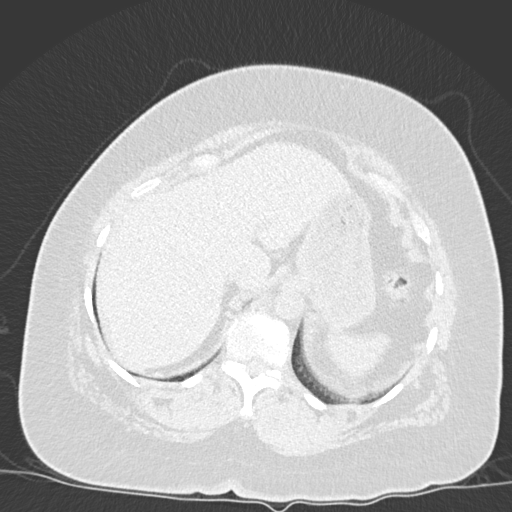
[im 33/140  lung]
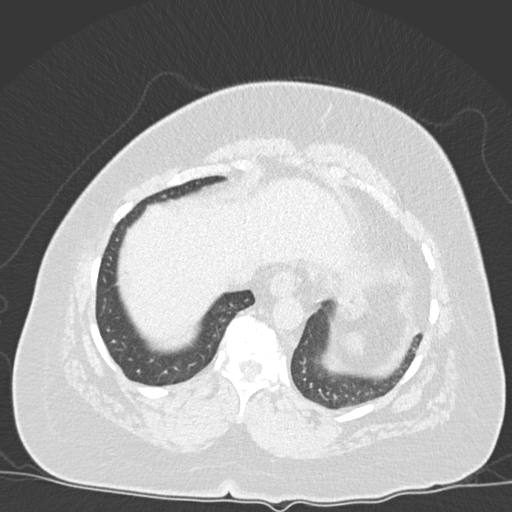
[im 43/140  lung]
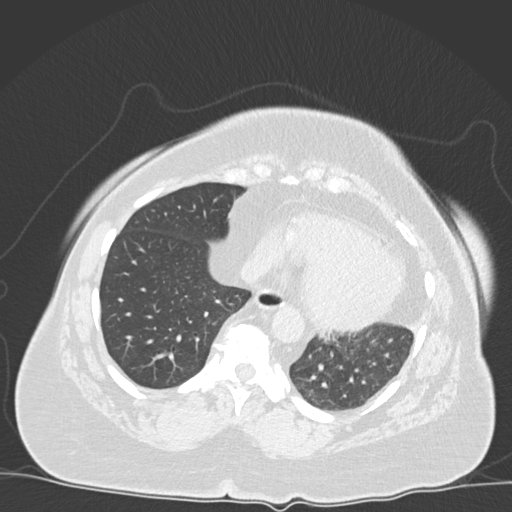
[im 54/140  mediastinal]
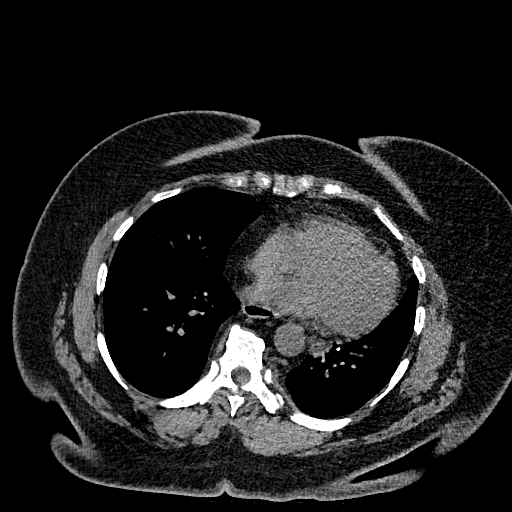
[im 54/140  lung]
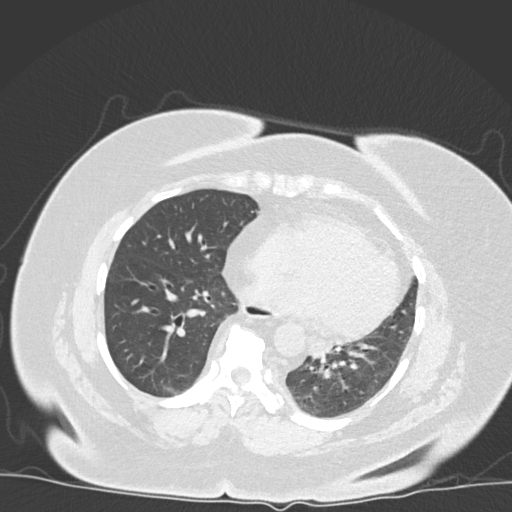
[im 65/140  lung]
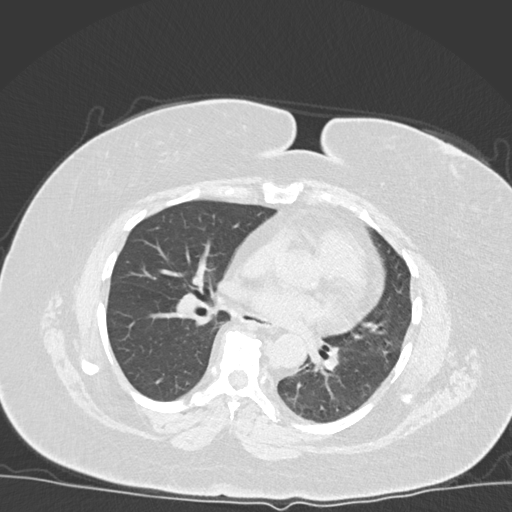
[im 75/140  lung]
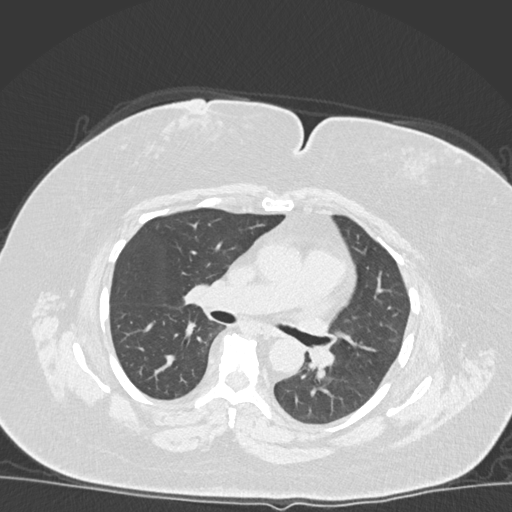
[im 86/140  lung]
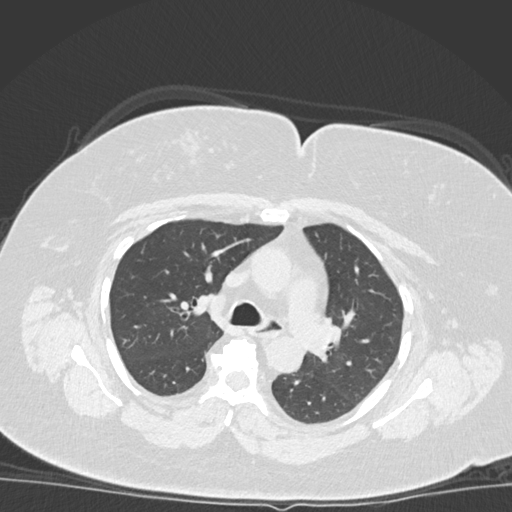
[im 97/140  mediastinal]
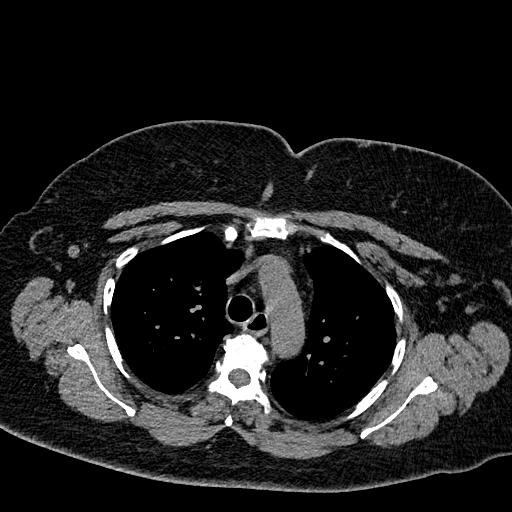
[im 97/140  lung]
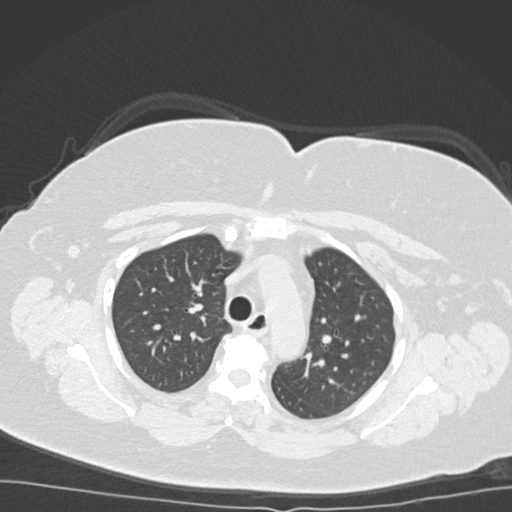
[im 107/140  lung]
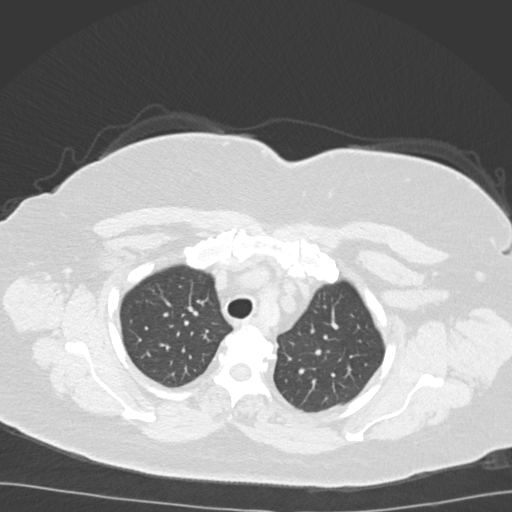
[im 118/140  lung]
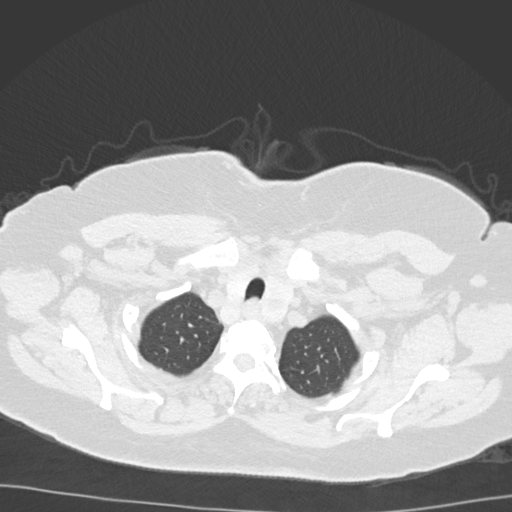
[im 129/140  lung]
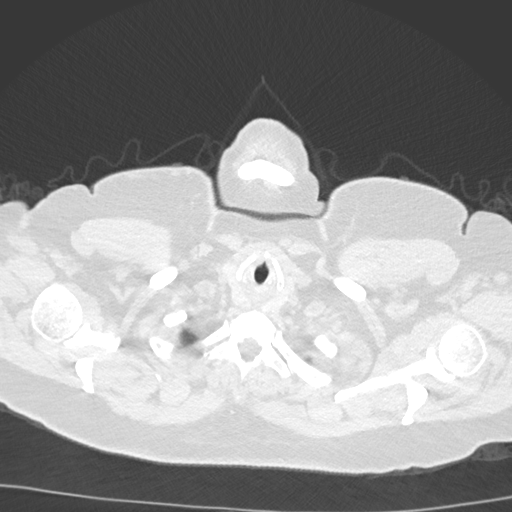

[Series 5: coronal · coronal · 0.59mm/px · 3 of 175 slices shown]
[im 35/175  lung]
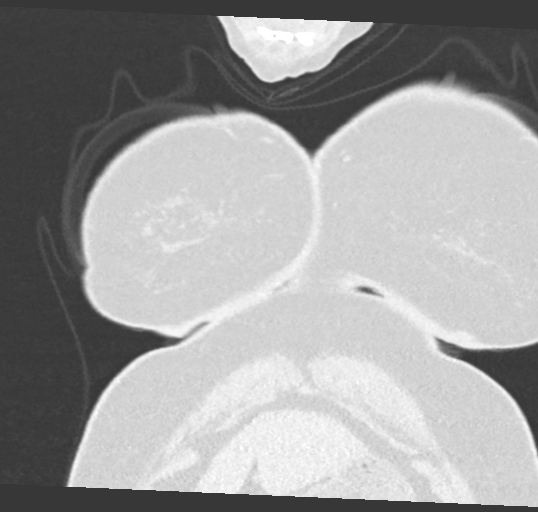
[im 70/175  lung]
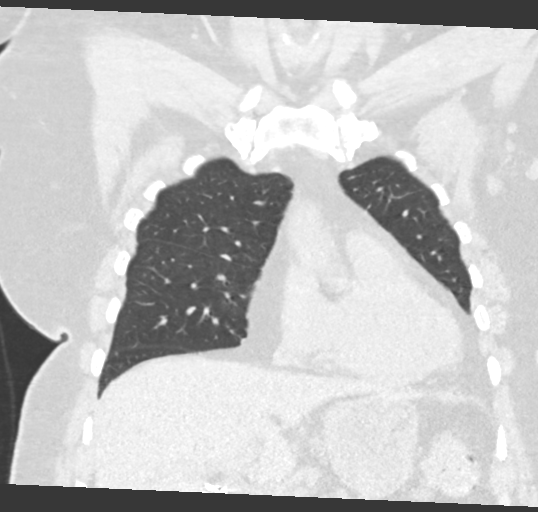
[im 105/175  lung]
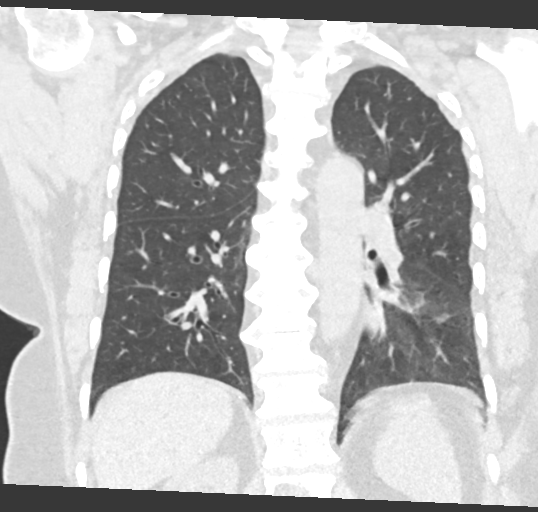

[15 of 36 positions shown; findings below may reference images not displayed]

FINDINGS: Cardiovascular: Aortic atherosclerosis. Mild cardiomegaly, without
pericardial effusion.

Mediastinum/Nodes: Enlargement, heterogeneity of both lobes of the
thyroid (evaluated on 10/23/2019 ultrasound). No mediastinal or
definite hilar adenopathy, given limitations of unenhanced CT.
Dilated esophagus with fluid level within, including on 55/2.

Lungs/Pleura: No pleural fluid. Minimal motion degradation within
the lower chest.

Right lower lobe calcified granuloma.

Pleural-based, relatively well-circumscribed inferior left
hemithorax lesion measures 1.6 x 1.3 cm on 86/2. Compare 1.9 x
cm on the prior exam (when remeasured). 2.9 cm craniocaudal on
coronal image 102 today versus 3.4 cm on the prior exam. Fluid
density, including on coronal image 102.

Upper Abdomen: Cholecystectomy. Normal imaged portions of the
spleen, stomach, adrenal glands, right kidney.

Musculoskeletal: Advanced thoracic spondylosis.
IMPRESSION: 1. Further decrease in size of a pleural-based fluid density
structure within the inferior left hemithorax. Ongoing regression
favors a benign entity, possibly fluid in a pericardial recess.
2.  No acute process in the chest.
3. Esophageal air fluid level suggests dysmotility or
gastroesophageal reflux.
4.  Aortic Atherosclerosis ([id number]).

## 2022-10-06 ENCOUNTER — Other Ambulatory Visit: Payer: Self-pay | Admitting: Family Medicine

## 2022-10-18 ENCOUNTER — Other Ambulatory Visit: Payer: Self-pay | Admitting: Family Medicine

## 2022-11-05 ENCOUNTER — Other Ambulatory Visit: Payer: Self-pay | Admitting: Family Medicine

## 2022-11-09 ENCOUNTER — Other Ambulatory Visit: Payer: Self-pay | Admitting: Family Medicine

## 2022-11-10 ENCOUNTER — Other Ambulatory Visit: Payer: Self-pay | Admitting: Family Medicine

## 2022-11-26 ENCOUNTER — Ambulatory Visit: Payer: Medicare HMO | Admitting: Family Medicine

## 2022-12-02 ENCOUNTER — Other Ambulatory Visit: Payer: Self-pay | Admitting: Family Medicine

## 2022-12-10 ENCOUNTER — Ambulatory Visit: Payer: Medicare HMO

## 2022-12-10 DIAGNOSIS — Z0000 Encounter for general adult medical examination without abnormal findings: Secondary | ICD-10-CM

## 2022-12-10 NOTE — Patient Instructions (Signed)

## 2022-12-10 NOTE — Progress Notes (Signed)
Subjective:   Kathleen [name] is a 72 y.o. female who presents for an Initial Medicare Annual Wellness Visit.  Visit Complete: Virtual  I connected with  Kathleen [name] on 12/10/22 by a audio enabled telemedicine application and verified that I am speaking with the correct person using two identifiers.  Patient Location: Home  Provider Location: Office/Clinic  I discussed the limitations of evaluation and management by telemedicine. The patient expressed understanding and agreed to proceed.  Patient Medicare AWV questionnaire was completed by the patient on 12/10/2022; I have confirmed that all information answered by patient is correct and no changes since this date.  Vital Signs: Unable to obtain new vitals due to this being a telehealth visit.   Review of Systems    Nutrition Risk Assessment:  Has the patient had any N/V/D within the last 2 months?  No  Does the patient have any non-healing wounds?  No  Has the patient had any unintentional weight loss or weight gain?  No   Diabetes:  Is the patient diabetic?  Yes  If diabetic, was a CBG obtained today?  No  Did the patient bring in their glucometer from home?  No  How often do you monitor your CBG's? Twice a day.   Financial Strains and Diabetes Management:  Are you having any financial strains with the device, your supplies or your medication? No .  Does the patient want to be seen by Chronic Care Management for management of their diabetes?  No  Would the patient like to be referred to a Nutritionist or for Diabetic Management?  No   Diabetic Exams:  Diabetic Eye Exam: Completed Due in October. Overdue for diabetic eye exam. Pt has been advised about the importance in completing this exam. A referral has been placed today. Message sent to referral coordinator for scheduling purposes. Advised pt to expect a call from office referred to regarding appt.  Diabetic Foot Exam: Completed Due. Pt has been advised about the  importance in completing this exam. Pt is scheduled for diabetic foot exam on 09/18/2022.         Objective:    There were no vitals filed for this visit. There is no height or weight on file to calculate BMI.     12/10/2022    2:04 PM 01/07/2022    8:38 AM 12/11/2021   10:36 AM 12/04/2021    1:38 PM 05/14/2021    5:31 PM 01/19/2021   10:41 AM 01/01/2021    3:36 PM  Advanced Directives  Does Patient Have a Medical Advance Directive? No No No No No No No  Would patient like information on creating a medical advance directive? No - Patient declined No - Patient declined No - Patient declined No - Patient declined No - Patient declined No - Patient declined No - Patient declined    Current Medications (verified) Outpatient Encounter Medications as of 12/10/2022  Medication Sig   allopurinol (ZYLOPRIM) 100 MG tablet Take 1 tablet (100 mg total) by mouth daily.   amLODipine (NORVASC) 10 MG tablet TAKE ONE TABLET BY MOUTH ONCE DAILY FOR BLOOD PRESSURE.   apixaban (ELIQUIS) 5 MG TABS tablet TAKE (1) TABLET BY MOUTH TWICE DAILY.   aspirin EC 81 MG tablet Take 81 mg by mouth daily. Swallow whole.   cholecalciferol (VITAMIN D) 1000 UNITS tablet Take 1,000 Units by mouth daily. Takes 2 tab daily   fluticasone (FLONASE) 50 MCG/ACT nasal spray Place into both nostrils daily.  furosemide (LASIX) 20 MG tablet Take 20 mg by mouth 2 (two) times daily.   gabapentin (NEURONTIN) 100 MG capsule Take 1 capsule (100 mg total) by mouth 3 (three) times daily.   glipiZIDE (GLIPIZIDE XL) 2.5 MG 24 hr tablet Take 1 tablet (2.5 mg total) by mouth daily with breakfast.   glipiZIDE (GLUCOTROL XL) 2.5 MG 24 hr tablet TAKE (1) TABLET BY MOUTH ONCE DAILY.   glipiZIDE (GLUCOTROL XL) 5 MG 24 hr tablet Take 1 tablet (5 mg total) by mouth daily with breakfast.   glipiZIDE (GLUCOTROL XL) 5 MG 24 hr tablet Take 1 tablet (5 mg total) by mouth daily with breakfast.   hydrALAZINE (APRESOLINE) 50 MG tablet Take 1 tablet (50 mg total)  by mouth 3 (three) times daily.   hydrochlorothiazide (HYDRODIURIL) 25 MG tablet Take 1 tablet (25 mg total) by mouth daily.   LANTUS SOLOSTAR 100 UNIT/ML Solostar Pen INJECT 60 UNITS INTO THE SKIN ONCE DAILY.   mirtazapine (REMERON) 7.5 MG tablet TAKE 1 TABLET BY MOUTH AT BEDTIME. THIS IS FOR SLEEP AND FOR YOUR APPETITE.   montelukast (SINGULAIR) 10 MG tablet TAKE (1) TABLET BY MOUTH AT BEDTIME.   olmesartan (BENICAR) 20 MG tablet TAKE 1 TABLET BY MOUTH ONCE A DAY.   ONETOUCH ULTRA test strip USE TO TEST BLOOD GLUCOSE 3 TIMES DAILY AS DIRECTED.   rosuvastatin (CRESTOR) 10 MG tablet TAKE ONE TABLET BY MOUTH ONCE DAILY.   SURE COMFORT PEN NEEDLES 31G X 8 MM MISC USE ONCE DAILY WITH LANTUS.   [DISCONTINUED] sitaGLIPtan (JANUVIA) 100 MG tablet Take 100 mg by mouth daily.     No facility-administered encounter medications on file as of 12/10/2022.    Allergies (verified) Patient has no known allergies.   History: Past Medical History:  Diagnosis Date   Acute respiratory failure with hypoxia (HCC) 10/22/2019   Anemia    Arthritis    Chronic kidney disease    Diabetes mellitus type II    Dysphagia    unspecified    GERD (gastroesophageal reflux disease)    Hyperlipidemia 2000   Hypertension 1995   Insomnia    Low back pain    Obesity    PE (pulmonary thromboembolism) (HCC)    Sleep apnea in adult 11/25/2019   Thyroid nodule 11/25/2019   Past Surgical History:  Procedure Laterality Date   Carpal tunnel release     left    CHOLECYSTECTOMY  2007   DIGIT NAIL REMOVAL  08/2011   KNEE SURGERY  1.20.2012   arthroscopy LEFT knee partial medial meniscectomy   TENOTOMY     2,3,4  left foot    toenail removal     TUBAL LIGATION     Family History  Problem Relation Age of Onset   Diabetes Mother    Hypertension Mother        MI   Heart disease Mother    Kidney disease Mother 57       dialysis   Diabetes Father    Diabetes Sister    Dementia Sister    Diabetes Brother     Diabetes Brother    Kidney disease Brother    Breast cancer Neg Hx    Social History   Socioeconomic History   Marital status: Divorced    Spouse name: Not on file   Number of children: 4   Years of education: Not on file   Highest education level: 11th grade  Occupational History   Occupation: Full time meat  packer   Tobacco Use   Smoking status: Never   Smokeless tobacco: Never  Vaping Use   Vaping status: Never Used  Substance and Sexual Activity   Alcohol use: No   Drug use: No   Sexual activity: Yes  Other Topics Concern   Not on file  Social History Narrative   Lives with 2nd oldest son   Social Determinants of Health   Financial Resource Strain: Low Risk  (12/10/2022)   Overall Financial Resource Strain (CARDIA)    Difficulty of Paying Living Expenses: Not hard at all  Food Insecurity: No Food Insecurity (12/10/2022)   Hunger Vital Sign    Worried About Running Out of Food in the Last Year: Never true    Ran Out of Food in the Last Year: Never true  Transportation Needs: No Transportation Needs (12/10/2022)   PRAPARE - Administrator, Civil Service (Medical): No    Lack of Transportation (Non-Medical): No  Physical Activity: Insufficiently Active (12/10/2022)   Exercise Vital Sign    Days of Exercise per Week: 3 days    Minutes of Exercise per Session: 30 min  Stress: No Stress Concern Present (12/10/2022)   [employer] of Occupational Health - Occupational Stress Questionnaire    Feeling of Stress : Not at all  Social Connections: Moderately Isolated (12/10/2022)   Social Connection and Isolation Panel [NHANES]    Frequency of Communication with Friends and Family: More than three times a week    Frequency of Social Gatherings with Friends and Family: More than three times a week    Attends Religious Services: More than 4 times per year    Active Member of [employer] or Organizations: No    Attends Engineer, structural: Never    Marital Status:  Divorced    Tobacco Counseling Counseling given: Not Answered   Clinical Intake:  Pre-visit preparation completed: Yes  Pain : No/denies pain     Diabetes: Yes CBG done?: No Did pt. bring in CBG monitor from home?: No  How often do you need to have someone help you when you read instructions, pamphlets, or other written materials from your doctor or pharmacy?: 1 - Never What is the last grade level you completed in school?: 11  Interpreter Needed?: No      Activities of Daily Living    12/10/2022    2:02 PM  In your present state of health, do you have any difficulty performing the following activities:  Hearing? 0  Vision? 0  Difficulty concentrating or making decisions? 0  Walking or climbing stairs? 1  Dressing or bathing? 0  Doing errands, shopping? 0  Preparing Food and eating ? N  Using the Toilet? N  In the past six months, have you accidently leaked urine? N  Managing your Medications? N  Managing your Finances? N  Housekeeping or managing your Housekeeping? N    Patient Care Team: Kerri Perches, MD as PCP - General Carver, Hennie Duos, DO as Consulting Physician (Internal Medicine) Doreatha Massed, MD as Medical Oncologist (Hematology)  Indicate any recent Medical Services you may have received from other than [facility] providers in the past year (date may be approximate).     Assessment:   This is a routine wellness examination for Annahi.  Hearing/Vision screen No results found.  Dietary issues and exercise activities discussed:     Goals Addressed             This Visit's Progress  Patient Stated       Patient decline      Depression Screen    12/10/2022    2:04 PM 10/01/2022    2:37 PM 07/21/2022    8:23 AM 02/04/2022    1:28 PM 01/22/2022   11:55 AM 01/08/2022    9:28 AM 12/04/2021    1:38 PM  PHQ 2/9 Scores  PHQ - 2 Score 0 0 0 0 0 0 0  PHQ- 9 Score  0         Fall Risk    12/10/2022    2:04 PM 10/01/2022    2:37 PM  07/21/2022    8:23 AM 02/17/2022    1:06 PM 02/04/2022    1:28 PM  Fall Risk   Falls in the past year? 0 0 0 0 0  Number falls in past yr: 0 0 0  0  Injury with Fall? 0 0 0  0  Risk for fall due to :  No Fall Risks No Fall Risks  No Fall Risks  Follow up  Falls evaluation completed Falls evaluation completed Falls evaluation completed Falls evaluation completed    MEDICARE RISK AT HOME:  Medicare Risk at Home - 12/10/22 1404     Any stairs in or around the home? No    If so, are there any without handrails? No    Home free of loose throw rugs in walkways, pet beds, electrical cords, etc? Yes    Adequate lighting in your home to reduce risk of falls? Yes    Life alert? No    Use of a cane, walker or w/c? Yes    Grab bars in the bathroom? Yes    Shower chair or bench in shower? Yes    Elevated toilet seat or a handicapped toilet? No             TIMED UP AND GO:  Was the test performed? No    Cognitive Function:    12/03/2020   10:56 AM  MMSE - Mini Mental State Exam  Not completed: Unable to complete        12/10/2022    2:05 PM 12/04/2021    1:39 PM 12/03/2020   10:56 AM 12/03/2019   11:07 AM 09/20/2018    2:57 PM  6CIT Screen  What Year? 0 points 0 points 0 points 0 points 0 points  What month? 0 points 0 points 0 points 0 points 0 points  What time? 0 points 0 points 0 points 0 points 0 points  Count back from 20 0 points 0 points 0 points 0 points 0 points  Months in reverse 2 points 2 points 4 points 0 points 0 points  Repeat phrase 0 points 0 points 0 points 2 points 0 points  Total Score 2 points 2 points 4 points 2 points 0 points    Immunizations Immunization History  Administered Date(s) Administered   Fluad Quad(high Dose 65+) 03/06/2019, 01/30/2020, 01/08/2021, 02/04/2022   Influenza Split 02/23/2011, 03/14/2012   Influenza Whole 03/24/2007, 02/21/2008, 02/26/2010   Influenza,inj,Quad PF,6+ Mos 04/23/2014, 04/15/2015, 12/29/2015, 02/14/2017,  12/27/2017   Moderna SARS-COV2 Booster Vaccination 11/28/2020   Moderna Sars-Covid-2 Vaccination 07/26/2019, 08/28/2019, 06/06/2020   Pneumococcal Conjugate-13 12/26/2013   Pneumococcal Polysaccharide-23 11/29/2008, 08/12/2015   Td 02/03/2009   Zoster, Live 10/28/2010    TDAP status: Due, Education has been provided regarding the importance of this vaccine. Advised may receive this vaccine at local pharmacy or Health Dept. Aware to  provide a copy of the vaccination record if obtained from local pharmacy or Health Dept. Verbalized acceptance and understanding.  Flu Vaccine status: Due, Education has been provided regarding the importance of this vaccine. Advised may receive this vaccine at local pharmacy or Health Dept. Aware to provide a copy of the vaccination record if obtained from local pharmacy or Health Dept. Verbalized acceptance and understanding.  Pneumococcal vaccine status: Up to date  Covid-19 vaccine status: Completed vaccines  Qualifies for Shingles Vaccine? Yes   Zostavax completed No   Shingrix Completed?: No.    Education has been provided regarding the importance of this vaccine. Patient has been advised to call insurance company to determine out of pocket expense if they have not yet received this vaccine. Advised may also receive vaccine at local pharmacy or Health Dept. Verbalized acceptance and understanding.  Screening Tests Health Maintenance  Topic Date Due   Zoster Vaccines- Shingrix (1 of 2) 04/26/1968   DTaP/Tdap/Td (2 - Tdap) 02/04/2019   OPHTHALMOLOGY EXAM  07/16/2022   FOOT EXAM  09/18/2022   INFLUENZA VACCINE  12/09/2022   COVID-19 Vaccine (4 - 2023-24 season) 01/17/2023 (Originally 01/08/2022)   HEMOGLOBIN A1C  01/21/2023   Diabetic kidney evaluation - eGFR measurement  07/21/2023   Diabetic kidney evaluation - Urine ACR  07/21/2023   MAMMOGRAM  10/29/2023   Medicare Annual Wellness (AWV)  12/10/2023   Fecal DNA (Cologuard)  10/12/2024   Pneumonia  Vaccine 43+ Years old  Completed   DEXA SCAN  Completed   Hepatitis C Screening  Completed   HPV VACCINES  Aged Out   Colonoscopy  Discontinued    Health Maintenance  Health Maintenance Due  Topic Date Due   Zoster Vaccines- Shingrix (1 of 2) 04/26/1968   DTaP/Tdap/Td (2 - Tdap) 02/04/2019   OPHTHALMOLOGY EXAM  07/16/2022   FOOT EXAM  09/18/2022   INFLUENZA VACCINE  12/09/2022    Colorectal cancer screening: Type of screening: Cologuard. Completed 10/12/2021. Repeat every 3 years  Mammogram status: Completed 10/28/2021. Repeat every year  Bone Density status: Completed 2016. Results reflect: Bone density results: NORMAL. Repeat every 10 years.  Lung Cancer Screening: (Low Dose CT Chest recommended if Age 37-80 years, 20 pack-year currently smoking OR have quit w/in 15years.) does not qualify.   Lung Cancer Screening Referral:   Additional Screening:  Hepatitis C Screening: does qualify; Completed   Vision Screening: Recommended annual ophthalmology exams for early detection of glaucoma and other disorders of the eye. Is the patient up to date with their annual eye exam?  No  Who is the provider or what is the name of the office in which the patient attends annual eye exams? N/a If pt is not established with a provider, would they like to be referred to a provider to establish care? No .   Dental Screening: Recommended annual dental exams for proper oral hygiene  Diabetic Foot Exam: Diabetic Foot Exam: Overdue, Pt has been advised about the importance in completing this exam. Pt is scheduled for diabetic foot exam on 09/18/2022.  Community Resource Referral / Chronic Care Management: CRR required this visit?  No   CCM required this visit?  No     Plan:     I have personally reviewed and noted the following in the patient's chart:   Medical and social history Use of alcohol, tobacco or illicit drugs  Current medications and supplements including opioid prescriptions.  Patient is not currently taking opioid prescriptions. Functional ability and  status Nutritional status Physical activity Advanced directives List of other physicians Hospitalizations, surgeries, and ER visits in previous 12 months Vitals Screenings to include cognitive, depression, and falls Referrals and appointments  In addition, I have reviewed and discussed with patient certain preventive protocols, quality metrics, and best practice recommendations. A written personalized care plan for preventive services as well as general preventive health recommendations were provided to patient.     Jacklynn Barnacle, CMA   12/10/2022   After Visit Summary: (Declined) Due to this being a telephonic visit, with patients personalized plan was offered to patient but patient Declined AVS at this time   Nurse Notes:  Ms. Moehle , Thank you for taking time to come for your Medicare Wellness Visit. I appreciate your ongoing commitment to your health goals. Please review the following plan we discussed and let me know if I can assist you in the future.   These are the goals we discussed:  Goals      HEMOGLOBIN A1C < 7.0     Increase physical activity     Start walking 5 days a week for 15-20 mins     Patient Stated     Patient decline     Reduce carbohydrate intake     Starting 04/20/2016 try to decrease amount of carbohydrates in your diet.        This is a list of the screening recommended for you and due dates:  Health Maintenance  Topic Date Due   Zoster (Shingles) Vaccine (1 of 2) 04/26/1968   DTaP/Tdap/Td vaccine (2 - Tdap) 02/04/2019   Eye exam for diabetics  07/16/2022   Complete foot exam   09/18/2022   Flu Shot  12/09/2022   COVID-19 Vaccine (4 - 2023-24 season) 01/17/2023*   Hemoglobin A1C  01/21/2023   Yearly kidney function blood test for diabetes  07/21/2023   Yearly kidney health urinalysis for diabetes  07/21/2023   Mammogram  10/29/2023   Medicare Annual Wellness  Visit  12/10/2023   Cologuard (Stool DNA test)  10/12/2024   Pneumonia Vaccine  Completed   DEXA scan (bone density measurement)  Completed   Hepatitis C Screening  Completed   HPV Vaccine  Aged Out   Colon Cancer Screening  Discontinued  *Topic was postponed. The date shown is not the original due date.

## 2023-01-06 ENCOUNTER — Other Ambulatory Visit: Payer: Self-pay | Admitting: Family Medicine

## 2023-01-20 DIAGNOSIS — K027 Dental root caries: Secondary | ICD-10-CM | POA: Diagnosis not present

## 2023-01-20 DIAGNOSIS — Z6836 Body mass index (BMI) 36.0-36.9, adult: Secondary | ICD-10-CM | POA: Diagnosis not present

## 2023-01-20 DIAGNOSIS — R030 Elevated blood-pressure reading, without diagnosis of hypertension: Secondary | ICD-10-CM | POA: Diagnosis not present

## 2023-01-20 DIAGNOSIS — E669 Obesity, unspecified: Secondary | ICD-10-CM | POA: Diagnosis not present

## 2023-01-28 ENCOUNTER — Telehealth: Payer: Self-pay | Admitting: Family Medicine

## 2023-01-28 NOTE — Telephone Encounter (Signed)
Patient called in is needing to get tooth pulled with dentist office and cannot due to being on blood thinner  Needs to know how long she needs to stop taking blood thinner so that dentist can pull tooth.  Wants a call back.

## 2023-01-31 NOTE — Telephone Encounter (Signed)
Patient is calling back this morning, her dentist appointment is 09.25.2024 needs to know today about stopping her blood thinner. Call back [phone]

## 2023-02-01 NOTE — Telephone Encounter (Signed)
Patient aware.

## 2023-02-02 DIAGNOSIS — E790 Hyperuricemia without signs of inflammatory arthritis and tophaceous disease: Secondary | ICD-10-CM | POA: Diagnosis not present

## 2023-02-02 DIAGNOSIS — E1169 Type 2 diabetes mellitus with other specified complication: Secondary | ICD-10-CM | POA: Diagnosis not present

## 2023-02-02 DIAGNOSIS — N1832 Chronic kidney disease, stage 3b: Secondary | ICD-10-CM | POA: Diagnosis not present

## 2023-02-03 LAB — BMP8+EGFR
BUN/Creatinine Ratio: 13 (ref 12–28)
BUN: 16 mg/dL (ref 8–27)
CO2: 20 mmol/L (ref 20–29)
Calcium: 9.4 mg/dL (ref 8.7–10.3)
Chloride: 103 mmol/L (ref 96–106)
Creatinine, Ser: 1.23 mg/dL — ABNORMAL HIGH (ref 0.57–1.00)
Glucose: 73 mg/dL (ref 70–99)
Potassium: 4.2 mmol/L (ref 3.5–5.2)
Sodium: 140 mmol/L (ref 134–144)
eGFR: 46 mL/min/{1.73_m2} — ABNORMAL LOW (ref >59)

## 2023-02-03 LAB — HEMOGLOBIN A1C
Est. average glucose Bld gHb Est-mCnc: 171 mg/dL
Hgb A1c MFr Bld: 7.6 % — ABNORMAL HIGH (ref 4.8–5.6)

## 2023-02-03 LAB — URIC ACID: Uric Acid: 6.7 mg/dL (ref 3.1–7.9)

## 2023-02-07 ENCOUNTER — Other Ambulatory Visit: Payer: Self-pay | Admitting: Family Medicine

## 2023-02-07 DIAGNOSIS — J302 Other seasonal allergic rhinitis: Secondary | ICD-10-CM

## 2023-02-10 ENCOUNTER — Other Ambulatory Visit: Payer: Self-pay

## 2023-02-10 MED ORDER — ONETOUCH ULTRA VI STRP: ORAL_STRIP | 0 refills | Status: DC

## 2023-02-10 MED ORDER — MIRTAZAPINE 7.5 MG PO TABS: ORAL_TABLET | ORAL | 1 refills | Status: DC

## 2023-02-10 MED ORDER — LANTUS SOLOSTAR 100 UNIT/ML ~~LOC~~ SOPN: 60.0000 [IU] | PEN_INJECTOR | Freq: Every day | SUBCUTANEOUS | 1 refills | Status: DC

## 2023-02-10 MED ORDER — ROSUVASTATIN CALCIUM 10 MG PO TABS: 10.0000 mg | ORAL_TABLET | Freq: Every day | ORAL | 0 refills | Status: DC

## 2023-03-03 ENCOUNTER — Other Ambulatory Visit: Payer: Self-pay

## 2023-03-03 MED ORDER — ALLOPURINOL 100 MG PO TABS: 100.0000 mg | ORAL_TABLET | Freq: Every day | ORAL | 6 refills | Status: DC

## 2023-03-11 ENCOUNTER — Other Ambulatory Visit: Payer: Self-pay | Admitting: Family Medicine

## 2023-03-16 ENCOUNTER — Ambulatory Visit: Payer: Medicare HMO | Admitting: Family Medicine

## 2023-03-20 ENCOUNTER — Other Ambulatory Visit: Payer: Self-pay | Admitting: Family Medicine

## 2023-03-27 ENCOUNTER — Other Ambulatory Visit: Payer: Self-pay | Admitting: Family Medicine

## 2023-03-27 DIAGNOSIS — M48061 Spinal stenosis, lumbar region without neurogenic claudication: Secondary | ICD-10-CM

## 2023-03-27 DIAGNOSIS — M545 Low back pain, unspecified: Secondary | ICD-10-CM

## 2023-03-27 DIAGNOSIS — M25551 Pain in right hip: Secondary | ICD-10-CM

## 2023-03-28 DIAGNOSIS — H52223 Regular astigmatism, bilateral: Secondary | ICD-10-CM | POA: Diagnosis not present

## 2023-03-28 DIAGNOSIS — E119 Type 2 diabetes mellitus without complications: Secondary | ICD-10-CM | POA: Diagnosis not present

## 2023-03-28 DIAGNOSIS — H524 Presbyopia: Secondary | ICD-10-CM | POA: Diagnosis not present

## 2023-03-28 LAB — HM DIABETES EYE EXAM

## 2023-03-29 ENCOUNTER — Other Ambulatory Visit: Payer: Self-pay | Admitting: Family Medicine

## 2023-04-04 ENCOUNTER — Other Ambulatory Visit: Payer: Self-pay | Admitting: Family Medicine

## 2023-04-15 ENCOUNTER — Encounter: Payer: Self-pay | Admitting: Family Medicine

## 2023-04-15 ENCOUNTER — Ambulatory Visit: Payer: Medicare HMO | Admitting: Family Medicine

## 2023-04-15 VITALS — BP 140/74 | HR 99 | Resp 16 | Ht 66.0 in | Wt 223.0 lb

## 2023-04-15 DIAGNOSIS — E785 Hyperlipidemia, unspecified: Secondary | ICD-10-CM | POA: Diagnosis not present

## 2023-04-15 DIAGNOSIS — N1832 Chronic kidney disease, stage 3b: Secondary | ICD-10-CM | POA: Diagnosis not present

## 2023-04-15 DIAGNOSIS — I10 Essential (primary) hypertension: Secondary | ICD-10-CM

## 2023-04-15 DIAGNOSIS — E1169 Type 2 diabetes mellitus with other specified complication: Secondary | ICD-10-CM | POA: Diagnosis not present

## 2023-04-15 DIAGNOSIS — E66811 Obesity, class 1: Secondary | ICD-10-CM | POA: Diagnosis not present

## 2023-04-15 DIAGNOSIS — Z1231 Encounter for screening mammogram for malignant neoplasm of breast: Secondary | ICD-10-CM

## 2023-04-15 DIAGNOSIS — B351 Tinea unguium: Secondary | ICD-10-CM | POA: Diagnosis not present

## 2023-04-15 DIAGNOSIS — Z23 Encounter for immunization: Secondary | ICD-10-CM | POA: Diagnosis not present

## 2023-04-15 DIAGNOSIS — Z6835 Body mass index (BMI) 35.0-35.9, adult: Secondary | ICD-10-CM | POA: Diagnosis not present

## 2023-04-15 MED ORDER — HYDRALAZINE HCL 25 MG PO TABS: 25.0000 mg | ORAL_TABLET | Freq: Three times a day (TID) | ORAL | 3 refills | Status: DC

## 2023-04-15 NOTE — Patient Instructions (Signed)
Follow-up in mid to end  January to reevaluate blood pressure and diabetes, call if you need me sooner.  Flu vaccine in office today.  Foot exam shows that you have reduced sensation so is important to check feet regularly and protect your feet with wearing shoes and socks all the time.  Terbinafine tablets is prescribed for a total of 12 weeks to treat fungal nail infection.  Blood pressure is uncontrolled hydralazine is increased to 75 mg 3 times daily.  You will take  one 50 mg tablet +one 25 mg tablet every 8 hours at 6 2 and 10.  Continue your other blood pressure medications as before.  Fasting lipid CMP and EGFR and HbA1c to be drawn 1 week before your follow-up visit.    Mammogram that is  past due is to be scheduled at checkout. 9 nurse pls order)  Thanks for choosing [facility], we consider it a privelige to serve you.

## 2023-04-17 ENCOUNTER — Other Ambulatory Visit: Payer: Self-pay | Admitting: Family Medicine

## 2023-04-20 ENCOUNTER — Ambulatory Visit (HOSPITAL_COMMUNITY): Payer: Medicare HMO

## 2023-04-21 ENCOUNTER — Ambulatory Visit (HOSPITAL_COMMUNITY)
Admission: RE | Admit: 2023-04-21 | Discharge: 2023-04-21 | Disposition: A | Discharge status: Home / Self Care | Payer: Medicare HMO | Source: Ambulatory Visit | Attending: Family Medicine | Admitting: Family Medicine

## 2023-04-21 ENCOUNTER — Encounter (HOSPITAL_COMMUNITY): Payer: Self-pay

## 2023-04-21 DIAGNOSIS — Z1231 Encounter for screening mammogram for malignant neoplasm of breast: Secondary | ICD-10-CM | POA: Insufficient documentation

## 2023-05-02 ENCOUNTER — Encounter: Payer: Self-pay | Admitting: Family Medicine

## 2023-05-02 DIAGNOSIS — Z23 Encounter for immunization: Secondary | ICD-10-CM | POA: Insufficient documentation

## 2023-05-02 HISTORY — DX: Encounter for immunization: Z23

## 2023-05-02 NOTE — Assessment & Plan Note (Signed)
Hyperlipidemia:Low fat diet discussed and encouraged.   Lipid Panel  Lab Results  Component Value Date   CHOL 91 (L) 07/21/2022   HDL 36 (L) 07/21/2022   LDLCALC 40 07/21/2022   TRIG 72 07/21/2022   CHOLHDL 2.5 07/21/2022     Controlled, no change in medication Increased exercise encouraged

## 2023-05-02 NOTE — Assessment & Plan Note (Signed)
Stable followed by nephrology 

## 2023-05-02 NOTE — Assessment & Plan Note (Signed)
Elevated and not at goal, med compliance stressed, re eval in next several weeks DASH diet and commitment to daily physical activity for a minimum of 30 minutes discussed and encouraged, as a part of hypertension management. The importance of attaining a healthy weight is also discussed.     04/15/2023    2:20 PM 04/15/2023    1:25 PM 10/01/2022    2:37 PM 10/01/2022    2:36 PM 07/21/2022    8:56 AM 07/21/2022    8:23 AM 07/21/2022    8:19 AM  BP/Weight  Systolic BP 140 167 142 158 140 140 147  Diastolic BP 74 78 78 63 60 72 67  Wt. (Lbs)  223  208   214.04  BMI  35.99 kg/m2  33.57 kg/m2   34.55 kg/m2

## 2023-05-02 NOTE — Assessment & Plan Note (Signed)
Bilateral severe onychomycosis, terbinafine prescribed x 12 weeks total

## 2023-05-02 NOTE — Progress Notes (Signed)
Kathleen [name]     MRN: [id number]      DOB: 30-Sep-1950  Chief Complaint  Patient presents with   Diabetes    Follow up visit     HPI Kathleen [name] is here for follow up and re-evaluation of chronic medical conditions, medication management and review of any available recent lab and radiology data.  Preventive health is updated, specifically  Cancer screening and Immunization.   Questions or concerns regarding consultations or procedures which the PT has had in the interim are  addressed. The PT denies any adverse reactions to current medications since the last visit.  There are no new concerns.  There are no specific complaints  Denies polyuria, polydipsia, blurred vision , or hypoglycemic episodes.   ROS Denies recent fever or chills. Denies sinus pressure, nasal congestion, ear pain or sore throat. Denies chest congestion, productive cough or wheezing. Denies chest pains, palpitations and leg swelling Denies abdominal pain, nausea, vomiting,diarrhea or constipation.   Denies dysuria, frequency, hesitancy or incontinence. Chronic joint pain, swelling and limitation in mobility. Denies headaches, seizures, numbness, or tingling. Denies depression, anxiety or insomnia. Denies skin break down or rash.   PE  BP (!) 140/74   Pulse 99   Resp 16   Ht 5\' 6"  (1.676 m)   Wt 223 lb (101.2 kg)   SpO2 91%   BMI 35.99 kg/m   Patient alert and oriented and in no cardiopulmonary distress.  HEENT: No facial asymmetry, EOMI,     Neck supple .  Chest: Clear to auscultation bilaterally.  CVS: S1, S2 no murmurs, no S3.Regular rate.  ABD: Soft non tender.   Ext: No edema  MS: Adequate ROM spine, shoulders,  decreased in hips and knees.  Skin: Intact, no ulcerations or rash noted.  Psych: Good eye contact, normal affect. Memory intact not anxious or depressed appearing.  CNS: CN 2-12 intact, power,  normal throughout.no focal deficits noted.   Assessment & Plan  Diabetes  mellitus type 2 with complications (HCC) Diabetes associated with hypertension, hyperlipidemia, neurological disease, and depression CKD [name] is reminded of the importance of commitment to daily physical activity for 30 minutes or more, as able and the need to limit carbohydrate intake to 30 to 60 grams per meal to help with blood sugar control.   The need to take medication as prescribed, test blood sugar as directed, and to call between visits if there is a concern that blood sugar is uncontrolled is also discussed.  Updated lab needed at/ before next visit.   [name] is reminded of the importance of daily foot exam, annual eye examination, and good blood sugar, blood pressure and cholesterol control.     Latest Ref Rng & Units 02/02/2023   10:22 AM 07/21/2022    9:25 AM 02/04/2022    2:33 PM 01/08/2022   10:30 AM 12/11/2021   11:08 AM  Diabetic Labs  HbA1c 4.8 - 5.6 % 7.6  9.7   9.8    Micro/Creat Ratio 0 - 29 mg/g creat  34      Chol 100 - 199 mg/dL  91   595    HDL >63 mg/dL  36   42    Calc LDL 0 - 99 mg/dL  40   875    Triglycerides 0 - 149 mg/dL  72   643    Creatinine 0.57 - 1.00 mg/dL 3.29  5.18  8.41   6.60  04/15/2023    2:20 PM 04/15/2023    1:25 PM 10/01/2022    2:37 PM 10/01/2022    2:36 PM 07/21/2022    8:56 AM 07/21/2022    8:23 AM 07/21/2022    8:19 AM  BP/Weight  Systolic BP 140 167 142 158 140 140 147  Diastolic BP 74 78 78 63 60 72 67  Wt. (Lbs)  223  208   214.04  BMI  35.99 kg/m2  33.57 kg/m2   34.55 kg/m2      Latest Ref Rng & Units 03/28/2023   12:00 AM 09/17/2021    1:40 PM  Foot/eye exam completion dates  Eye Exam No Retinopathy No Retinopathy       Foot Form Completion   Done     This result is from an external source.        Chronic kidney disease, stage 3 unspecified (HCC) Stable followed by nephrology  Hyperlipidemia LDL goal <100 Hyperlipidemia:Low fat diet discussed and encouraged.   Lipid Panel  Lab Results   Component Value Date   CHOL 91 (L) 07/21/2022   HDL 36 (L) 07/21/2022   LDLCALC 40 07/21/2022   TRIG 72 07/21/2022   CHOLHDL 2.5 07/21/2022     Controlled, no change in medication Increased exercise encouraged  Essential hypertension Elevated and not at goal, med compliance stressed, re eval in next several weeks DASH diet and commitment to daily physical activity for a minimum of 30 minutes discussed and encouraged, as a part of hypertension management. The importance of attaining a healthy weight is also discussed.     04/15/2023    2:20 PM 04/15/2023    1:25 PM 10/01/2022    2:37 PM 10/01/2022    2:36 PM 07/21/2022    8:56 AM 07/21/2022    8:23 AM 07/21/2022    8:19 AM  BP/Weight  Systolic BP 140 167 142 158 140 140 147  Diastolic BP 74 78 78 63 60 72 67  Wt. (Lbs)  223  208   214.04  BMI  35.99 kg/m2  33.57 kg/m2   34.55 kg/m2       Encounter for immunization After obtaining informed consent, the influenza  vaccine is  administered , with no adverse effect noted at the time of administration.   Obesity (BMI 30.0-34.9)  Patient re-educated about  the importance of commitment to a  minimum of 150 minutes of exercise per week as able.  The importance of healthy food choices with portion control discussed, as well as eating regularly and within a 12 hour window most days. The need to choose "clean , green" food 50 to 75% of the time is discussed, as well as to make water the primary drink and set a goal of 64 ounces water daily.       04/15/2023    1:25 PM 10/01/2022    2:36 PM 07/21/2022    8:19 AM  Weight /BMI  Weight 223 lb 208 lb 214 lb 0.6 oz  Height 5\' 6"  (1.676 m) 5\' 6"  (1.676 m) 5\' 6"  (1.676 m)  BMI 35.99 kg/m2 33.57 kg/m2 34.55 kg/m2    Deterioraed needs to work on weight loss through better food choice

## 2023-05-02 NOTE — Assessment & Plan Note (Addendum)
Diabetes associated with hypertension, hyperlipidemia, neurological disease, and depression CKD [name] is reminded of the importance of commitment to daily physical activity for 30 minutes or more, as able and the need to limit carbohydrate intake to 30 to 60 grams per meal to help with blood sugar control.   The need to take medication as prescribed, test blood sugar as directed, and to call between visits if there is a concern that blood sugar is uncontrolled is also discussed.  Updated lab needed at/ before next visit.   [name] is reminded of the importance of daily foot exam, annual eye examination, and good blood sugar, blood pressure and cholesterol control.     Latest Ref Rng & Units 02/02/2023   10:22 AM 07/21/2022    9:25 AM 02/04/2022    2:33 PM 01/08/2022   10:30 AM 12/11/2021   11:08 AM  Diabetic Labs  HbA1c 4.8 - 5.6 % 7.6  9.7   9.8    Micro/Creat Ratio 0 - 29 mg/g creat  34      Chol 100 - 199 mg/dL  91   782    HDL >95 mg/dL  36   42    Calc LDL 0 - 99 mg/dL  40   621    Triglycerides 0 - 149 mg/dL  72   308    Creatinine 0.57 - 1.00 mg/dL 6.57  8.46  9.62   9.52       04/15/2023    2:20 PM 04/15/2023    1:25 PM 10/01/2022    2:37 PM 10/01/2022    2:36 PM 07/21/2022    8:56 AM 07/21/2022    8:23 AM 07/21/2022    8:19 AM  BP/Weight  Systolic BP 140 167 142 158 140 140 147  Diastolic BP 74 78 78 63 60 72 67  Wt. (Lbs)  223  208   214.04  BMI  35.99 kg/m2  33.57 kg/m2   34.55 kg/m2      Latest Ref Rng & Units 03/28/2023   12:00 AM 09/17/2021    1:40 PM  Foot/eye exam completion dates  Eye Exam No Retinopathy No Retinopathy       Foot Form Completion   Done     This result is from an external source.

## 2023-05-02 NOTE — Assessment & Plan Note (Signed)
After obtaining informed consent, the influenza vaccine is  administered , with no adverse effect noted at the time of administration.

## 2023-05-02 NOTE — Assessment & Plan Note (Signed)
  Patient re-educated about  the importance of commitment to a  minimum of 150 minutes of exercise per week as able.  The importance of healthy food choices with portion control discussed, as well as eating regularly and within a 12 hour window most days. The need to choose "clean , green" food 50 to 75% of the time is discussed, as well as to make water the primary drink and set a goal of 64 ounces water daily.       04/15/2023    1:25 PM 10/01/2022    2:36 PM 07/21/2022    8:19 AM  Weight /BMI  Weight 223 lb 208 lb 214 lb 0.6 oz  Height 5\' 6"  (1.676 m) 5\' 6"  (1.676 m) 5\' 6"  (1.676 m)  BMI 35.99 kg/m2 33.57 kg/m2 34.55 kg/m2    Deterioraed needs to work on weight loss through better food choice

## 2023-05-05 ENCOUNTER — Other Ambulatory Visit: Payer: Self-pay

## 2023-05-05 MED ORDER — GLIPIZIDE ER 2.5 MG PO TB24: ORAL_TABLET | ORAL | 2 refills | Status: DC

## 2023-05-30 ENCOUNTER — Other Ambulatory Visit: Payer: Self-pay | Admitting: Family Medicine

## 2023-06-03 ENCOUNTER — Ambulatory Visit: Payer: Medicare HMO | Admitting: Family Medicine

## 2023-06-07 ENCOUNTER — Other Ambulatory Visit: Payer: Self-pay | Admitting: Family Medicine

## 2023-06-12 ENCOUNTER — Other Ambulatory Visit: Payer: Self-pay | Admitting: Family Medicine

## 2023-07-12 ENCOUNTER — Ambulatory Visit: Payer: Medicare HMO | Admitting: Family Medicine

## 2023-07-24 ENCOUNTER — Other Ambulatory Visit: Payer: Self-pay | Admitting: Family Medicine

## 2023-08-02 ENCOUNTER — Other Ambulatory Visit: Payer: Self-pay | Admitting: Family Medicine

## 2023-08-06 ENCOUNTER — Other Ambulatory Visit: Payer: Self-pay | Admitting: Family Medicine

## 2023-08-10 ENCOUNTER — Other Ambulatory Visit: Payer: Self-pay | Admitting: Family Medicine

## 2023-08-16 ENCOUNTER — Encounter: Payer: Self-pay | Admitting: Family Medicine

## 2023-08-16 ENCOUNTER — Other Ambulatory Visit: Payer: Self-pay | Admitting: Family Medicine

## 2023-08-16 ENCOUNTER — Ambulatory Visit (INDEPENDENT_AMBULATORY_CARE_PROVIDER_SITE_OTHER): Admitting: Family Medicine

## 2023-08-16 VITALS — BP 144/70 | HR 97 | Resp 16 | Ht 66.0 in | Wt 229.0 lb

## 2023-08-16 DIAGNOSIS — I1A0 Resistant hypertension: Secondary | ICD-10-CM

## 2023-08-16 DIAGNOSIS — E1169 Type 2 diabetes mellitus with other specified complication: Secondary | ICD-10-CM

## 2023-08-16 DIAGNOSIS — E790 Hyperuricemia without signs of inflammatory arthritis and tophaceous disease: Secondary | ICD-10-CM

## 2023-08-16 DIAGNOSIS — Z794 Long term (current) use of insulin: Secondary | ICD-10-CM

## 2023-08-16 DIAGNOSIS — Z0001 Encounter for general adult medical examination with abnormal findings: Secondary | ICD-10-CM | POA: Insufficient documentation

## 2023-08-16 DIAGNOSIS — E785 Hyperlipidemia, unspecified: Secondary | ICD-10-CM | POA: Diagnosis not present

## 2023-08-16 DIAGNOSIS — R7989 Other specified abnormal findings of blood chemistry: Secondary | ICD-10-CM

## 2023-08-16 DIAGNOSIS — J302 Other seasonal allergic rhinitis: Secondary | ICD-10-CM

## 2023-08-16 MED ORDER — OLMESARTAN MEDOXOMIL 40 MG PO TABS: 40.0000 mg | ORAL_TABLET | Freq: Every day | ORAL | 3 refills | Status: DC

## 2023-08-16 NOTE — Assessment & Plan Note (Signed)
 Hyperlipidemia:Low fat diet discussed and encouraged.   Lipid Panel  Lab Results  Component Value Date   CHOL 91 (L) 07/21/2022   HDL 36 (L) 07/21/2022   LDLCALC 40 07/21/2022   TRIG 72 07/21/2022   CHOLHDL 2.5 07/21/2022     Updated lab needed at/ before next visit.

## 2023-08-16 NOTE — Patient Instructions (Signed)
 Follow-up in 5 weeks, reevaluate blood pressure and blood sugar, call if you need me sooner.  Stop  glipizide due to low blood sugar readings continue Lantus as before.  New higher dose of olmesartan for blood pressure is 40 mg 1 daily stop olmesartan 20 mg tablet.  Labs today including urine testing.  Continue good health habits and check the senior center for group sessions for exercise and other activities for seniors.  Reconsider vaccines  Thanks for choosing [facility], we consider it a privelige to serve you.

## 2023-08-16 NOTE — Assessment & Plan Note (Signed)
 Not at goal, inc to olmesartan 40 mg DASH diet and commitment to daily physical activity for a minimum of 30 minutes discussed and encouraged, as a part of hypertension management. The importance of attaining a healthy weight is also discussed.     08/16/2023    1:24 PM 08/16/2023    1:02 PM 04/15/2023    2:20 PM 04/15/2023    1:25 PM 10/01/2022    2:37 PM 10/01/2022    2:36 PM 07/21/2022    8:56 AM  BP/Weight  Systolic BP 144 145 140 167 142 158 140  Diastolic BP 70 66 74 78 78 63 60  Wt. (Lbs)  229  223  208   BMI  36.96 kg/m2  35.99 kg/m2  33.57 kg/m2

## 2023-08-16 NOTE — Assessment & Plan Note (Signed)

## 2023-08-16 NOTE — Assessment & Plan Note (Signed)
 Updated lab needed at/ before next visit.

## 2023-08-16 NOTE — Assessment & Plan Note (Signed)
 Diabetes associated with hypertension, hyperlipidemia, obesity, and CKD  [name] is reminded of the importance of commitment to daily physical activity for 30 minutes or more, as able and the need to limit carbohydrate intake to 30 to 60 grams per meal to help with blood sugar control.   The need to take medication as prescribed, test blood sugar as directed, and to call between visits if there is a concern that blood sugar is uncontrolled is also discussed.   [name] is reminded of the importance of daily foot exam, annual eye examination, and good blood sugar, blood pressure and cholesterol control.     Latest Ref Rng & Units 02/02/2023   10:22 AM 07/21/2022    9:25 AM 02/04/2022    2:33 PM 01/08/2022   10:30 AM 12/11/2021   11:08 AM  Diabetic Labs  HbA1c 4.8 - 5.6 % 7.6  9.7   9.8    Micro/Creat Ratio 0 - 29 mg/g creat  34      Chol 100 - 199 mg/dL  91   161    HDL >09 mg/dL  36   42    Calc LDL 0 - 99 mg/dL  40   604    Triglycerides 0 - 149 mg/dL  72   540    Creatinine 0.57 - 1.00 mg/dL 9.81  1.91  4.78   2.95       08/16/2023    1:24 PM 08/16/2023    1:02 PM 04/15/2023    2:20 PM 04/15/2023    1:25 PM 10/01/2022    2:37 PM 10/01/2022    2:36 PM 07/21/2022    8:56 AM  BP/Weight  Systolic BP 144 145 140 167 142 158 140  Diastolic BP 70 66 74 78 78 63 60  Wt. (Lbs)  229  223  208   BMI  36.96 kg/m2  35.99 kg/m2  33.57 kg/m2       Latest Ref Rng & Units 03/28/2023   12:00 AM 09/17/2021    1:40 PM  Foot/eye exam completion dates  Eye Exam No Retinopathy No Retinopathy       Foot Form Completion   Done     This result is from an external source.

## 2023-08-16 NOTE — Progress Notes (Signed)
 Kathleen [name]     MRN: [id number]      DOB: 1950/11/26  Chief Complaint  Patient presents with   Annual Exam  Annual exam  HPI: Patient is in for annual physical exam. Uncontrolled hypertension and diabetes are also addressed with med changes Immunization is reviewed , and  pt still deferring vaccines, re educated   PE: BP (!) 144/70   Pulse 97   Resp 16   Ht 5\' 6"  (1.676 m)   Wt 229 lb (103.9 kg)   SpO2 93%   BMI 36.96 kg/m   Pleasant  female, alert and oriented x 3, in no cardio-pulmonary distress. Afebrile. HEENT No facial trauma or asymetry. Sinuses non tender.  Extra occullar muscles intact.. External ears normal, . Neck: supple, no adenopathy,JVD or thyromegaly.No bruits.  Chest: Clear to ascultation bilaterally.No crackles or wheezes. Non tender to palpation  Breast: Not examined, asymptomatic, normal mammogram in 04/2023  Cardiovascular system; Heart sounds normal,  S1 and  S2 ,no S3.  No murmur, or thrill. Apical beat not displaced Peripheral pulses normal.  Abdomen: Soft, non tender, no organomegaly or masses. .  Musculoskeletal exam: Full ROM of spine, hips , shoulders and knees. No deformity ,swelling or crepitus noted. No muscle wasting or atrophy.   Neurologic: Cranial nerves 2 to 12 intact. Power, tone ,sensation and reflexes normal throughout. No disturbance in gait. No tremor.  Skin: Intact, no ulceration, erythema , scaling or rash noted. Pigmentation normal throughout  Psych; Normal mood and affect. Judgement and concentration normal   Assessment & Plan:  Encounter for Medicare annual examination with abnormal findings Annual exam as documented. Counseling done  re healthy lifestyle involving commitment to 150 minutes exercise per week, heart healthy diet, and attaining healthy weight.The importance of adequate sleep also discussed. Regular seat belt use and home safety, is also discussed. Changes in health habits are  decided on by the patient with goals and time frames  set for achieving them. Immunization and cancer screening needs are specifically addressed at this visit.   Hyperuricemia Updated lab needed at/ before next visit.   Resistant hypertension Not at goal, inc to olmesartan 40 mg DASH diet and commitment to daily physical activity for a minimum of 30 minutes discussed and encouraged, as a part of hypertension management. The importance of attaining a healthy weight is also discussed.     08/16/2023    1:24 PM 08/16/2023    1:02 PM 04/15/2023    2:20 PM 04/15/2023    1:25 PM 10/01/2022    2:37 PM 10/01/2022    2:36 PM 07/21/2022    8:56 AM  BP/Weight  Systolic BP 144 145 140 167 142 158 140  Diastolic BP 70 66 74 78 78 63 60  Wt. (Lbs)  229  223  208   BMI  36.96 kg/m2  35.99 kg/m2  33.57 kg/m2        Hyperlipidemia LDL goal <100 Hyperlipidemia:Low fat diet discussed and encouraged.   Lipid Panel  Lab Results  Component Value Date   CHOL 91 (L) 07/21/2022   HDL 36 (L) 07/21/2022   LDLCALC 40 07/21/2022   TRIG 72 07/21/2022   CHOLHDL 2.5 07/21/2022     Updated lab needed at/ before next visit.   Type 2 diabetes mellitus with other specified complication (HCC) Diabetes associated with hypertension, hyperlipidemia, obesity, and CKD  Kathleen [name] is reminded of the importance of commitment to daily physical activity for 30 minutes or  more, as able and the need to limit carbohydrate intake to 30 to 60 grams per meal to help with blood sugar control.   The need to take medication as prescribed, test blood sugar as directed, and to call between visits if there is a concern that blood sugar is uncontrolled is also discussed.   Kathleen [name] is reminded of the importance of daily foot exam, annual eye examination, and good blood sugar, blood pressure and cholesterol control.     Latest Ref Rng & Units 02/02/2023   10:22 AM 07/21/2022    9:25 AM 02/04/2022    2:33 PM 01/08/2022    10:30 AM 12/11/2021   11:08 AM  Diabetic Labs  HbA1c 4.8 - 5.6 % 7.6  9.7   9.8    Micro/Creat Ratio 0 - 29 mg/g creat  34      Chol 100 - 199 mg/dL  91   161    HDL >09 mg/dL  36   42    Calc LDL 0 - 99 mg/dL  40   604    Triglycerides 0 - 149 mg/dL  72   540    Creatinine 0.57 - 1.00 mg/dL 9.81  1.91  4.78   2.95       08/16/2023    1:24 PM 08/16/2023    1:02 PM 04/15/2023    2:20 PM 04/15/2023    1:25 PM 10/01/2022    2:37 PM 10/01/2022    2:36 PM 07/21/2022    8:56 AM  BP/Weight  Systolic BP 144 145 140 167 142 158 140  Diastolic BP 70 66 74 78 78 63 60  Wt. (Lbs)  229  223  208   BMI  36.96 kg/m2  35.99 kg/m2  33.57 kg/m2       Latest Ref Rng & Units 03/28/2023   12:00 AM 09/17/2021    1:40 PM  Foot/eye exam completion dates  Eye Exam No Retinopathy No Retinopathy       Foot Form Completion   Done     This result is from an external source.

## 2023-08-18 ENCOUNTER — Other Ambulatory Visit: Payer: Self-pay | Admitting: Family Medicine

## 2023-08-19 LAB — CBC WITH DIFFERENTIAL/PLATELET
Basophils Absolute: 0 10*3/uL (ref 0.0–0.2)
Basos: 0 %
EOS (ABSOLUTE): 0.2 10*3/uL (ref 0.0–0.4)
Eos: 2 %
Hematocrit: 37.8 % (ref 34.0–46.6)
Hemoglobin: 11.7 g/dL (ref 11.1–15.9)
Immature Grans (Abs): 0 10*3/uL (ref 0.0–0.1)
Immature Granulocytes: 0 %
Lymphocytes Absolute: 3.1 10*3/uL (ref 0.7–3.1)
Lymphs: 44 %
MCH: 26.7 pg (ref 26.6–33.0)
MCHC: 31 g/dL — ABNORMAL LOW (ref 31.5–35.7)
MCV: 86 fL (ref 79–97)
Monocytes Absolute: 0.4 10*3/uL (ref 0.1–0.9)
Monocytes: 6 %
Neutrophils Absolute: 3.3 10*3/uL (ref 1.4–7.0)
Neutrophils: 48 %
Platelets: 267 10*3/uL (ref 150–450)
RBC: 4.39 x10E6/uL (ref 3.77–5.28)
RDW: 13.4 % (ref 11.7–15.4)
WBC: 7.1 10*3/uL (ref 3.4–10.8)

## 2023-08-19 LAB — CMP14+EGFR
ALT: 24 IU/L (ref 0–32)
AST: 18 IU/L (ref 0–40)
Albumin: 4 g/dL (ref 3.8–4.8)
Alkaline Phosphatase: 126 IU/L — ABNORMAL HIGH (ref 44–121)
BUN/Creatinine Ratio: 12 (ref 12–28)
BUN: 16 mg/dL (ref 8–27)
Bilirubin Total: 0.2 mg/dL (ref 0.0–1.2)
CO2: 25 mmol/L (ref 20–29)
Calcium: 9.7 mg/dL (ref 8.7–10.3)
Chloride: 100 mmol/L (ref 96–106)
Creatinine, Ser: 1.37 mg/dL — ABNORMAL HIGH (ref 0.57–1.00)
Globulin, Total: 3.2 g/dL (ref 1.5–4.5)
Glucose: 170 mg/dL — ABNORMAL HIGH (ref 70–99)
Potassium: 4.5 mmol/L (ref 3.5–5.2)
Sodium: 142 mmol/L (ref 134–144)
Total Protein: 7.2 g/dL (ref 6.0–8.5)
eGFR: 41 mL/min/{1.73_m2} — ABNORMAL LOW (ref >59)

## 2023-08-19 LAB — MICROALBUMIN / CREATININE URINE RATIO
Creatinine, Urine: 69 mg/dL
Microalb/Creat Ratio: 7 mg/g{creat} (ref 0–29)
Microalbumin, Urine: 5.1 ug/mL

## 2023-08-19 LAB — LIPID PANEL W/O CHOL/HDL RATIO
Cholesterol, Total: 114 mg/dL (ref 100–199)
HDL: 43 mg/dL (ref >39)
LDL Chol Calc (NIH): 40 mg/dL (ref 0–99)
Triglycerides: 191 mg/dL — ABNORMAL HIGH (ref 0–149)
VLDL Cholesterol Cal: 31 mg/dL (ref 5–40)

## 2023-08-19 LAB — TSH+FREE T4
Free T4: 1.13 ng/dL (ref 0.82–1.77)
TSH: 1.33 u[IU]/mL (ref 0.450–4.500)

## 2023-08-19 LAB — URIC ACID: Uric Acid: 7.4 mg/dL (ref 3.1–7.9)

## 2023-08-19 LAB — VITAMIN D 25 HYDROXY (VIT D DEFICIENCY, FRACTURES): Vit D, 25-Hydroxy: 24.4 ng/mL — ABNORMAL LOW (ref 30.0–100.0)

## 2023-08-19 LAB — HEMOGLOBIN A1C
Est. average glucose Bld gHb Est-mCnc: 206 mg/dL
Hgb A1c MFr Bld: 8.8 % — ABNORMAL HIGH (ref 4.8–5.6)

## 2023-08-22 ENCOUNTER — Other Ambulatory Visit: Payer: Self-pay | Admitting: Family Medicine

## 2023-08-22 MED ORDER — APIXABAN 5 MG PO TABS: ORAL_TABLET | ORAL | 11 refills | Status: DC

## 2023-08-22 NOTE — Telephone Encounter (Signed)
 Copied from CRM [id number]. Topic: Clinical - Medication Refill >> Aug 22, 2023 11:03 AM Baldemar Lev wrote: Most Recent Primary Care Visit:  Provider: Towanda Fret  Department: [facility]  Visit Type: OFFICE VISIT  Date: 08/16/2023  Medication: apixaban (ELIQUIS) 5 MG TABS tablet  Has the patient contacted their pharmacy? Yes (Agent: If no, request that the patient contact the pharmacy for the refill. If patient does not wish to contact the pharmacy document the reason why and proceed with request.) (Agent: If yes, when and what did the pharmacy advise?)  Is this the correct pharmacy for this prescription? Yes If no, delete pharmacy and type the correct one.  This is the patient's preferred pharmacy:   [facility] [address], [place] - [address] [place] [address] Phone: [phone] Fax: [phone] Hours: Not open 24 hours   Has the prescription been filled recently? Yes  Is the patient out of the medication? Yes  Has the patient been seen for an appointment in the last year OR does the patient have an upcoming appointment? Yes  Can we respond through MyChart? Yes  Agent: Please be advised that Rx refills may take up to 3 business days. We ask that you follow-up with your pharmacy.

## 2023-08-31 ENCOUNTER — Telehealth: Payer: Self-pay

## 2023-08-31 NOTE — Telephone Encounter (Signed)
 Patient was identified as falling into the [place] Measure - Diabetes.   Patient was: Appointment already scheduled for:  09/22/23.

## 2023-09-01 ENCOUNTER — Other Ambulatory Visit: Payer: Self-pay | Admitting: Family Medicine

## 2023-09-03 ENCOUNTER — Other Ambulatory Visit: Payer: Self-pay | Admitting: Family Medicine

## 2023-09-07 ENCOUNTER — Other Ambulatory Visit: Payer: Self-pay | Admitting: Family Medicine

## 2023-09-07 DIAGNOSIS — J302 Other seasonal allergic rhinitis: Secondary | ICD-10-CM

## 2023-09-07 DIAGNOSIS — M48061 Spinal stenosis, lumbar region without neurogenic claudication: Secondary | ICD-10-CM

## 2023-09-07 DIAGNOSIS — M545 Low back pain, unspecified: Secondary | ICD-10-CM

## 2023-09-07 DIAGNOSIS — M25551 Pain in right hip: Secondary | ICD-10-CM

## 2023-09-07 MED ORDER — AMLODIPINE BESYLATE 10 MG PO TABS: ORAL_TABLET | ORAL | 3 refills | Status: AC

## 2023-09-07 MED ORDER — MONTELUKAST SODIUM 10 MG PO TABS
ORAL_TABLET | ORAL | 5 refills | Status: DC
Start: 2023-09-07 — End: 2023-11-25

## 2023-09-07 MED ORDER — LANTUS SOLOSTAR 100 UNIT/ML ~~LOC~~ SOPN: 60.0000 [IU] | PEN_INJECTOR | Freq: Every day | SUBCUTANEOUS | 1 refills | Status: DC

## 2023-09-07 MED ORDER — ALLOPURINOL 100 MG PO TABS: 100.0000 mg | ORAL_TABLET | Freq: Every day | ORAL | 6 refills | Status: DC

## 2023-09-07 MED ORDER — MIRTAZAPINE 7.5 MG PO TABS: ORAL_TABLET | ORAL | 1 refills | Status: DC

## 2023-09-07 MED ORDER — OLMESARTAN MEDOXOMIL 40 MG PO TABS: 40.0000 mg | ORAL_TABLET | Freq: Every day | ORAL | 3 refills | Status: DC

## 2023-09-07 MED ORDER — GLIPIZIDE ER 5 MG PO TB24: 5.0000 mg | ORAL_TABLET | Freq: Every day | ORAL | 5 refills | Status: DC

## 2023-09-07 MED ORDER — GABAPENTIN 100 MG PO CAPS: 100.0000 mg | ORAL_CAPSULE | Freq: Three times a day (TID) | ORAL | 5 refills | Status: DC

## 2023-09-07 MED ORDER — APIXABAN 5 MG PO TABS: ORAL_TABLET | ORAL | 11 refills | Status: DC

## 2023-09-15 ENCOUNTER — Other Ambulatory Visit: Payer: Self-pay | Admitting: Family Medicine

## 2023-09-15 NOTE — Telephone Encounter (Signed)
 Copied from CRM [id number]. Topic: Clinical - Medication Refill >> Sep 15, 2023  4:36 PM Santiya F wrote: Medication: olmesartan  (BENICAR ) 20 MG tablet, hydrALAZINE  (APRESOLINE ) 25 MG tablet [[id number]]  Has the patient contacted their pharmacy? Yes  (Agent: If yes, when and what did the pharmacy advise?) Pharmacy Requesting refill   This is the patient's preferred pharmacy:   [address] [place] [address] Phone: [phone] Fax: [phone]   Is this the correct pharmacy for this prescription? Yes If no, delete pharmacy and type the correct one.   Has the prescription been filled recently? Yes  Is the patient out of the medication? Yes  Has the patient been seen for an appointment in the last year OR does the patient have an upcoming appointment? Yes  Can we respond through MyChart? Yes  Agent: Please be advised that Rx refills may take up to 3 business days. We ask that you follow-up with your pharmacy.

## 2023-09-16 MED ORDER — HYDRALAZINE HCL 25 MG PO TABS: 25.0000 mg | ORAL_TABLET | Freq: Three times a day (TID) | ORAL | 3 refills | Status: DC

## 2023-09-16 MED ORDER — OLMESARTAN MEDOXOMIL 40 MG PO TABS: 40.0000 mg | ORAL_TABLET | Freq: Every day | ORAL | 3 refills | Status: AC

## 2023-09-20 ENCOUNTER — Other Ambulatory Visit: Payer: Self-pay | Admitting: Family Medicine

## 2023-09-20 ENCOUNTER — Telehealth: Payer: Self-pay

## 2023-09-20 NOTE — Telephone Encounter (Signed)
 [facility] advised, patient also advised

## 2023-09-20 NOTE — Telephone Encounter (Signed)
 Copied from CRM [id number]. Topic: Clinical - Prescription Issue >> Sep 20, 2023 12:32 PM Phil Braun wrote: Reason for CRM:   Ref:glipiZIDE  (GLUCOTROL  XL) 5 MG 24 hr tablet  [facility] [phone]  Pharmacy has 2 different dosages 5mg  and 2.5mg , please call to clarify correct dosage,.

## 2023-09-22 ENCOUNTER — Encounter: Payer: Self-pay | Admitting: Family Medicine

## 2023-09-22 ENCOUNTER — Telehealth: Payer: Self-pay | Admitting: Family Medicine

## 2023-09-22 ENCOUNTER — Ambulatory Visit (INDEPENDENT_AMBULATORY_CARE_PROVIDER_SITE_OTHER): Admitting: Family Medicine

## 2023-09-22 VITALS — BP 150/80 | HR 97 | Resp 20 | Ht 65.0 in | Wt 232.1 lb

## 2023-09-22 DIAGNOSIS — E785 Hyperlipidemia, unspecified: Secondary | ICD-10-CM | POA: Diagnosis not present

## 2023-09-22 DIAGNOSIS — E1169 Type 2 diabetes mellitus with other specified complication: Secondary | ICD-10-CM

## 2023-09-22 DIAGNOSIS — E66811 Obesity, class 1: Secondary | ICD-10-CM

## 2023-09-22 DIAGNOSIS — Z794 Long term (current) use of insulin: Secondary | ICD-10-CM

## 2023-09-22 DIAGNOSIS — I1A0 Resistant hypertension: Secondary | ICD-10-CM

## 2023-09-22 DIAGNOSIS — N1832 Chronic kidney disease, stage 3b: Secondary | ICD-10-CM | POA: Diagnosis not present

## 2023-09-22 MED ORDER — LANTUS SOLOSTAR 100 UNIT/ML ~~LOC~~ SOPN: PEN_INJECTOR | SUBCUTANEOUS | 1 refills | Status: DC

## 2023-09-22 MED ORDER — ONETOUCH ULTRA VI STRP: ORAL_STRIP | 0 refills | Status: DC

## 2023-09-22 NOTE — Telephone Encounter (Signed)
 Copied from CRM [id number]. Topic: Appointments - Appointment Info/Confirmation >> Sep 22, 2023  1:37 PM Marissa P wrote: Patient/patient representative is calling for information regarding an appointment.  Patient called saying that she will be a few minutes late to her appt today

## 2023-09-22 NOTE — Patient Instructions (Addendum)
 F/U around July 15, call if you need me sooner  Non fasting HBA1C, chem 7 and EGFR 7/9 or sghortly after  Reduce lantus  to 55 units daily  Take olmesartan  40 mg one daily  Take hydralazine  25 mg at 6 am, 2pm and 10 pm  Take all other medication as you have been taking and are in your bag  STOP glipizide  2.5 mg  tab, olmesartan  20 mg tablet ( these are to be discarded and are in the glove)  Thanks for choosing [facility], we consider it a privelige to serve you.

## 2023-09-27 ENCOUNTER — Other Ambulatory Visit: Payer: Self-pay | Admitting: Family Medicine

## 2023-09-27 NOTE — Telephone Encounter (Signed)
 Copied from CRM [id number]. Topic: Clinical - Medication Refill >> Sep 27, 2023 10:29 AM [name] wrote: Medication: apixaban  (ELIQUIS ) 5 MG TABS tablet  Has the patient contacted their pharmacy? Yes (Agent: If no, request that the patient contact the pharmacy for the refill. If patient does not wish to contact the pharmacy document the reason why and proceed with request.) (Agent: If yes, when and what did the pharmacy advise?)  This is the patient's preferred pharmacy:  [facility] [address], [place] - [address] [place] [address] Phone: [phone] Fax: [phone]    Is this the correct pharmacy for this prescription? Yes If no, delete pharmacy and type the correct one.   Has the prescription been filled recently? No  Is the patient out of the medication? Yes  Has the patient been seen for an appointment in the last year OR does the patient have an upcoming appointment? Yes  Can we respond through MyChart? No  Agent: Please be advised that Rx refills may take up to 3 business days. We ask that you follow-up with your pharmacy.

## 2023-09-27 NOTE — Telephone Encounter (Signed)
 Per chart review, prescription was received from pharmacy today.

## 2023-09-30 NOTE — Telephone Encounter (Signed)
 Pt called saying that she is unable to pick up Eliquis  and Lantus  from [facility] pharmacy d/t system showing she received it already. Pt states she was unable to get it from [facility] because they had difficulty finding her address and returned meds. Please FU with [facility] as pt is out of medication.

## 2023-10-07 NOTE — Assessment & Plan Note (Signed)
 Filtration rate stable around 40, has been referred to Nephrology but has not followed through

## 2023-10-07 NOTE — Assessment & Plan Note (Signed)
 Uncontrolled inc olmesartan  to 40 mg daily and take hydralazine  every 8 hrs DASH diet and commitment to daily physical activity for a minimum of 30 minutes discussed and encouraged, as a part of hypertension management. The importance of attaining a healthy weight is also discussed.     09/22/2023    2:18 PM 09/22/2023    1:58 PM 08/16/2023    1:24 PM 08/16/2023    1:02 PM 04/15/2023    2:20 PM 04/15/2023    1:25 PM 10/01/2022    2:37 PM  BP/Weight  Systolic BP 150 154 144 145 140 167 142  Diastolic BP 80 71 70 66 74 78 78  Wt. (Lbs)  232.12  229  223   BMI  38.63 kg/m2  36.96 kg/m2  35.99 kg/m2

## 2023-10-07 NOTE — Progress Notes (Signed)
 Kathleen [name]     MRN: [id number]      DOB: December 09, 1950  Chief Complaint  Patient presents with   Medical Management of Chronic Issues    5 week follow up     HPI Kathleen [name] is here for follow up and re-evaluation of chronic medical conditions, medication management and review of any available recent lab and radiology data.  Preventive health is updated, specifically  Cancer screening and Immunization.   Questions or concerns regarding consultations or procedures which the PT has had in the interim are  addressed. The PT denies any adverse reactions to current medications since the last visit.  C/o low blood sugar spells at least 3 times pe week   ROS Denies recent fever or chills. Denies sinus pressure, nasal congestion, ear pain or sore throat. Denies chest congestion, productive cough or wheezing. Denies chest pains, palpitations and leg swelling Denies abdominal pain, nausea, vomiting,diarrhea or constipation.   Denies dysuria, frequency, hesitancy or incontinence. Chronnic joint pain,  and limitation in mobility. Denies headaches, seizures, numbness, or tingling. Denies depression, anxiety or insomnia. Denies skin break down or rash.   PE  BP (!) 150/80   Pulse 97   Resp 20   Ht 5\' 5"  (1.651 m)   Wt 232 lb 1.9 oz (105.3 kg)   SpO2 91%   BMI 38.63 kg/m   Patient alert and oriented and in no cardiopulmonary distress.  HEENT: No facial asymmetry, EOMI,     Neck supple .  Chest: Clear to auscultation bilaterally.  CVS: S1, S2 no murmurs, no S3.Regular rate.  ABD: Soft non tender.   Ext: No edema  MS: decreased  ROM spine, shoulders, hips and knees.  Skin: Intact, no ulcerations or rash noted.  Psych: Good eye contact, normal affect. Memory intact not anxious or depressed appearing.  CNS: CN 2-12 intact, power,  normal throughout.no focal deficits noted.   Assessment & Plan  Hyperlipidemia LDL goal <100 Hyperlipidemia:Low fat diet discussed and  encouraged.   Lipid Panel  Lab Results  Component Value Date   CHOL 114 08/16/2023   HDL 43 08/16/2023   LDLCALC 40 08/16/2023   TRIG 191 (H) 08/16/2023   CHOLHDL 2.5 07/21/2022     Needs to reduce fat in diet , due to high TG, no med change, will consider ezetemide if persists  Resistant hypertension Uncontrolled inc olmesartan  to 40 mg daily and take hydralazine  every 8 hrs DASH diet and commitment to daily physical activity for a minimum of 30 minutes discussed and encouraged, as a part of hypertension management. The importance of attaining a healthy weight is also discussed.     09/22/2023    2:18 PM 09/22/2023    1:58 PM 08/16/2023    1:24 PM 08/16/2023    1:02 PM 04/15/2023    2:20 PM 04/15/2023    1:25 PM 10/01/2022    2:37 PM  BP/Weight  Systolic BP 150 154 144 145 140 167 142  Diastolic BP 80 71 70 66 74 78 78  Wt. (Lbs)  232.12  229  223   BMI  38.63 kg/m2  36.96 kg/m2  35.99 kg/m2        Type 2 diabetes mellitus with other specified complication (HCC) Uncontrolled with fluctuating blood sugar report of hypoglycemia, dose med adjusted down and diligence with diet encouraged Diabetes associated with hypertension, hyperlipidemia, obesity, CKD, and arthritis  Kathleen [name] is reminded of the importance of commitment to daily physical activity  for 30 minutes or more, as able and the need to limit carbohydrate intake to 30 to 60 grams per meal to help with blood sugar control.   The need to take medication as prescribed, test blood sugar as directed, and to call between visits if there is a concern that blood sugar is uncontrolled is also discussed.   Kathleen [name] is reminded of the importance of daily foot exam, annual eye examination, and good blood sugar, blood pressure and cholesterol control.     Latest Ref Rng & Units 08/16/2023    1:49 PM 02/02/2023   10:22 AM 07/21/2022    9:25 AM 02/04/2022    2:33 PM 01/08/2022   10:30 AM  Diabetic Labs  HbA1c 4.8 - 5.6 % 8.8   7.6  9.7   9.8   Micro/Creat Ratio 0 - 29 mg/g creat 7   34     Chol 100 - 199 mg/dL 132   91   440   HDL >10 mg/dL 43   36   42   Calc LDL 0 - 99 mg/dL 40   40   272   Triglycerides 0 - 149 mg/dL 536   72   644   Creatinine 0.57 - 1.00 mg/dL 0.34  7.42  5.95  6.38        09/22/2023    2:18 PM 09/22/2023    1:58 PM 08/16/2023    1:24 PM 08/16/2023    1:02 PM 04/15/2023    2:20 PM 04/15/2023    1:25 PM 10/01/2022    2:37 PM  BP/Weight  Systolic BP 150 154 144 145 140 167 142  Diastolic BP 80 71 70 66 74 78 78  Wt. (Lbs)  232.12  229  223   BMI  38.63 kg/m2  36.96 kg/m2  35.99 kg/m2       Latest Ref Rng & Units 03/28/2023   12:00 AM 09/17/2021    1:40 PM  Foot/eye exam completion dates  Eye Exam No Retinopathy No Retinopathy       Foot Form Completion   Done     This result is from an external source.        Obesity (BMI 30.0-34.9)  Patient re-educated about  the importance of commitment to a  minimum of 150 minutes of exercise per week as able.  The importance of healthy food choices with portion control discussed, as well as eating regularly and within a 12 hour window most days. The need to choose "clean , green" food 50 to 75% of the time is discussed, as well as to make water the primary drink and set a goal of 64 ounces water daily.       09/22/2023    1:58 PM 08/16/2023    1:02 PM 04/15/2023    1:25 PM  Weight /BMI  Weight 232 lb 1.9 oz 229 lb 223 lb  Height 5\' 5"  (1.651 m) 5\' 6"  (1.676 m) 5\' 6"  (1.676 m)  BMI 38.63 kg/m2 36.96 kg/m2 35.99 kg/m2    worsened  Chronic kidney disease, stage 3 unspecified (HCC) Filtration rate stable around 40, has been referred to Nephrology but has not followed through

## 2023-10-07 NOTE — Assessment & Plan Note (Signed)
  Patient re-educated about  the importance of commitment to a  minimum of 150 minutes of exercise per week as able.  The importance of healthy food choices with portion control discussed, as well as eating regularly and within a 12 hour window most days. The need to choose "clean , green" food 50 to 75% of the time is discussed, as well as to make water the primary drink and set a goal of 64 ounces water daily.       09/22/2023    1:58 PM 08/16/2023    1:02 PM 04/15/2023    1:25 PM  Weight /BMI  Weight 232 lb 1.9 oz 229 lb 223 lb  Height 5\' 5"  (1.651 m) 5\' 6"  (1.676 m) 5\' 6"  (1.676 m)  BMI 38.63 kg/m2 36.96 kg/m2 35.99 kg/m2    worsened

## 2023-10-07 NOTE — Assessment & Plan Note (Signed)
 Hyperlipidemia:Low fat diet discussed and encouraged.   Lipid Panel  Lab Results  Component Value Date   CHOL 114 08/16/2023   HDL 43 08/16/2023   LDLCALC 40 08/16/2023   TRIG 191 (H) 08/16/2023   CHOLHDL 2.5 07/21/2022     Needs to reduce fat in diet , due to high TG, no med change, will consider ezetemide if persists

## 2023-10-07 NOTE — Assessment & Plan Note (Signed)
 Uncontrolled with fluctuating blood sugar report of hypoglycemia, dose med adjusted down and diligence with diet encouraged Diabetes associated with hypertension, hyperlipidemia, obesity, CKD, and arthritis  Kathleen [name] is reminded of the importance of commitment to daily physical activity for 30 minutes or more, as able and the need to limit carbohydrate intake to 30 to 60 grams per meal to help with blood sugar control.   The need to take medication as prescribed, test blood sugar as directed, and to call between visits if there is a concern that blood sugar is uncontrolled is also discussed.   Kathleen [name] is reminded of the importance of daily foot exam, annual eye examination, and good blood sugar, blood pressure and cholesterol control.     Latest Ref Rng & Units 08/16/2023    1:49 PM 02/02/2023   10:22 AM 07/21/2022    9:25 AM 02/04/2022    2:33 PM 01/08/2022   10:30 AM  Diabetic Labs  HbA1c 4.8 - 5.6 % 8.8  7.6  9.7   9.8   Micro/Creat Ratio 0 - 29 mg/g creat 7   34     Chol 100 - 199 mg/dL 161   91   096   HDL >04 mg/dL 43   36   42   Calc LDL 0 - 99 mg/dL 40   40   540   Triglycerides 0 - 149 mg/dL 981   72   191   Creatinine 0.57 - 1.00 mg/dL 4.78  2.95  6.21  3.08        09/22/2023    2:18 PM 09/22/2023    1:58 PM 08/16/2023    1:24 PM 08/16/2023    1:02 PM 04/15/2023    2:20 PM 04/15/2023    1:25 PM 10/01/2022    2:37 PM  BP/Weight  Systolic BP 150 154 144 145 140 167 142  Diastolic BP 80 71 70 66 74 78 78  Wt. (Lbs)  232.12  229  223   BMI  38.63 kg/m2  36.96 kg/m2  35.99 kg/m2       Latest Ref Rng & Units 03/28/2023   12:00 AM 09/17/2021    1:40 PM  Foot/eye exam completion dates  Eye Exam No Retinopathy No Retinopathy       Foot Form Completion   Done     This result is from an external source.

## 2023-10-10 NOTE — Telephone Encounter (Signed)
 Copied from CRM [id number]. Topic: Clinical - Medication Refill >> Oct 10, 2023 10:19 AM Emmet Harm C wrote: Medication: insulin  glargine (LANTUS  SOLOSTAR) 100 UNIT/ML Solostar Pen Lancets (ONETOUCH DELICA PLUS LANCET33G) MISC  Has the patient contacted their pharmacy? Yes (Agent: If no, request that the patient contact the pharmacy for the refill. If patient does not wish to contact the pharmacy document the reason why and proceed with request.) (Agent: If yes, when and what did the pharmacy advise?)  This is the patient's preferred pharmacy:   [facility] [place] [address] [place] [address] Phone: [phone] Fax: [phone]  Is this the correct pharmacy for this prescription? Yes If no, delete pharmacy and type the correct one.   Has the prescription been filled recently? No  Is the patient out of the medication? Yes  Has the patient been seen for an appointment in the last year OR does the patient have an upcoming appointment? Yes  Can we respond through MyChart? No  Agent: Please be advised that Rx refills may take up to 3 business days. We ask that you follow-up with your pharmacy.

## 2023-10-11 ENCOUNTER — Other Ambulatory Visit: Payer: Self-pay | Admitting: Family Medicine

## 2023-10-11 NOTE — Telephone Encounter (Unsigned)
 Copied from CRM [id number]. Topic: Clinical - Medication Refill >> Oct 11, 2023  4:20 PM Kathleen [name] wrote: Medication: mirtazapine  (REMERON ) 7.5 MG tablet  Has the patient contacted their pharmacy? Yes (Agent: If no, request that the patient contact the pharmacy for the refill. If patient does not wish to contact the pharmacy document the reason why and proceed with request.) (Agent: If yes, when and what did the pharmacy advise?)  This is the patient's preferred pharmacy:  [facility] [place] [address] [place] [address] Phone: [phone] Fax: [phone]  Is this the correct pharmacy for this prescription? Yes If no, delete pharmacy and type the correct one.   Has the prescription been filled recently? Yes, 09/07/2023  Is the patient out of the medication? Yes, has one week left  Has the patient been seen for an appointment in the last year OR does the patient have an upcoming appointment? Yes, 11/22/2023  Can we respond through MyChart? No  Agent: Please be advised that Rx refills may take up to 3 business days. We ask that you follow-up with your pharmacy.

## 2023-10-12 MED ORDER — MIRTAZAPINE 7.5 MG PO TABS: ORAL_TABLET | ORAL | 1 refills | Status: DC

## 2023-10-17 ENCOUNTER — Other Ambulatory Visit: Payer: Self-pay | Admitting: Family Medicine

## 2023-10-20 ENCOUNTER — Other Ambulatory Visit: Payer: Self-pay | Admitting: Family Medicine

## 2023-10-20 NOTE — Telephone Encounter (Signed)
 Copied from CRM [id number]. Topic: Clinical - Medication Refill >> Oct 20, 2023  5:26 PM Santiya F wrote: Medication: mirtazapine  (REMERON ) 7.5 MG tablet [[id number]]  Has the patient contacted their pharmacy? Yes  (Agent: If yes, when and what did the pharmacy advise?) pharmacy requesting refill   This is the patient's preferred pharmacy:   [facility] [place] [address] [place] [address] Phone: [phone] Fax: [phone]  Is this the correct pharmacy for this prescription? Yes If no, delete pharmacy and type the correct one.   Has the prescription been filled recently? Yes  Is the patient out of the medication? Yes  Has the patient been seen for an appointment in the last year OR does the patient have an upcoming appointment? Yes  Can we respond through MyChart? No  Patient is requesting a 90 day supply.   Agent: Please be advised that Rx refills may take up to 3 business days. We ask that you follow-up with your pharmacy.

## 2023-10-21 ENCOUNTER — Other Ambulatory Visit: Payer: Self-pay | Admitting: Family Medicine

## 2023-10-21 NOTE — Telephone Encounter (Unsigned)
 Copied from CRM [id number]. Topic: Clinical - Medication Refill >> Oct 21, 2023  4:56 PM Donald Frost wrote: Medication: insulin  glargine (LANTUS  SOLOSTAR) 100 UNIT/ML Solostar Pen, SURE COMFORT PEN NEEDLES 31G X 8 MM MISC for her Pen  Has the patient contacted their pharmacy? Yes   This is the patient's preferred pharmacy:   [facility] [place] [address] [place] [address] Phone: [phone] Fax: [phone]  Is this the correct pharmacy for this prescription? Yes If no, delete pharmacy and type the correct one.   Has the prescription been filled recently? No  Is the patient out of the medication? Yes  Has the patient been seen for an appointment in the last year OR does the patient have an upcoming appointment? Yes  Can we respond through MyChart? No  Please assist patient further

## 2023-10-24 MED ORDER — SURE COMFORT PEN NEEDLES 31G X 8 MM MISC
1.0000 | Freq: Every day | 0 refills | Status: DC
Start: 2023-10-24 — End: 2023-12-01

## 2023-10-24 MED ORDER — LANTUS SOLOSTAR 100 UNIT/ML ~~LOC~~ SOPN: PEN_INJECTOR | SUBCUTANEOUS | 0 refills | Status: DC

## 2023-10-25 ENCOUNTER — Telehealth: Payer: Self-pay | Admitting: Family Medicine

## 2023-10-25 MED ORDER — ONETOUCH ULTRA VI STRP
ORAL_STRIP | 0 refills | Status: AC
Start: 2023-10-25 — End: ?

## 2023-10-25 MED ORDER — GLIPIZIDE ER 5 MG PO TB24: 5.0000 mg | ORAL_TABLET | Freq: Every day | ORAL | 5 refills | Status: DC

## 2023-10-25 NOTE — Telephone Encounter (Signed)
 Copied from CRM [id number]. Topic: Clinical - Medication Refill >> Oct 25, 2023  9:36 AM Elle L wrote: Medication: glucose blood (ONETOUCH ULTRA) test strip AND glipiZIDE  (GLUCOTROL  XL) 5 MG 24 hr tablet  Has the patient contacted their pharmacy? Yes  This is the patient's preferred pharmacy:   [facility] [place] [address] [place] [address] Phone: [phone] Fax: [phone]  Is this the correct pharmacy for this prescription? Yes  Has the prescription been filled recently? Yes  Is the patient out of the medication? Yes  Has the patient been seen for an appointment in the last year OR does the patient have an upcoming appointment? Yes  Can we respond through MyChart? Yes  Agent: Please be advised that Rx refills may take up to 3 business days. We ask that you follow-up with your pharmacy.

## 2023-11-09 ENCOUNTER — Other Ambulatory Visit: Payer: Self-pay | Admitting: Family Medicine

## 2023-11-09 MED ORDER — MIRTAZAPINE 7.5 MG PO TABS: ORAL_TABLET | ORAL | 1 refills | Status: DC

## 2023-11-09 NOTE — Telephone Encounter (Signed)
 Copied from CRM [id number]. Topic: Clinical - Medication Refill >> Nov 09, 2023 12:26 PM Santiya F wrote: Medication: mirtazapine  (REMERON ) 7.5 MG tablet [[id number]]  Has the patient contacted their pharmacy? Yes  (Agent: If yes, when and what did the pharmacy advise?) pharmacy called in refilled   This is the patient's preferred pharmacy:   [facility] [place] [address] [place] [address] Phone: [phone] Fax: [phone]  Is this the correct pharmacy for this prescription? Yes If no, delete pharmacy and type the correct one.   Has the prescription been filled recently? Yes  Is the patient out of the medication? Yes  Has the patient been seen for an appointment in the last year OR does the patient have an upcoming appointment? Yes  Can we respond through MyChart? No  Agent: Please be advised that Rx refills may take up to 3 business days. We ask that you follow-up with your pharmacy.

## 2023-11-15 ENCOUNTER — Other Ambulatory Visit: Payer: Self-pay | Admitting: Family Medicine

## 2023-11-22 ENCOUNTER — Encounter: Payer: Self-pay | Admitting: Family Medicine

## 2023-11-22 ENCOUNTER — Ambulatory Visit (INDEPENDENT_AMBULATORY_CARE_PROVIDER_SITE_OTHER): Admitting: Family Medicine

## 2023-11-22 VITALS — BP 148/82 | HR 98 | Resp 18 | Ht 66.0 in | Wt 231.1 lb

## 2023-11-22 DIAGNOSIS — N1831 Chronic kidney disease, stage 3a: Secondary | ICD-10-CM | POA: Diagnosis not present

## 2023-11-22 DIAGNOSIS — I2699 Other pulmonary embolism without acute cor pulmonale: Secondary | ICD-10-CM | POA: Diagnosis not present

## 2023-11-22 DIAGNOSIS — I071 Rheumatic tricuspid insufficiency: Secondary | ICD-10-CM

## 2023-11-22 DIAGNOSIS — Z794 Long term (current) use of insulin: Secondary | ICD-10-CM

## 2023-11-22 DIAGNOSIS — E785 Hyperlipidemia, unspecified: Secondary | ICD-10-CM

## 2023-11-22 DIAGNOSIS — N1832 Chronic kidney disease, stage 3b: Secondary | ICD-10-CM | POA: Diagnosis not present

## 2023-11-22 DIAGNOSIS — E1169 Type 2 diabetes mellitus with other specified complication: Secondary | ICD-10-CM

## 2023-11-22 DIAGNOSIS — I1A0 Resistant hypertension: Secondary | ICD-10-CM

## 2023-11-22 MED ORDER — LANTUS SOLOSTAR 100 UNIT/ML ~~LOC~~ SOPN: PEN_INJECTOR | SUBCUTANEOUS | 0 refills | Status: DC

## 2023-11-22 NOTE — Patient Instructions (Addendum)
 F/U in  6 weeks   Please schedule mammogram  at checkout  you  will be referred to Cardiology and for diabetiic education  Labs today already ordered  Start committing to taking furosemide  one daily  Test blood sugar three times daily and write it down, Nurse please provide sheets  Goal for fasting blood sugar ranges from 80 to 120 and 2 hours after any meal or at bedtime should be between 130 to 170.  Thanks for choosing [facility], we consider it a privelige to serve you.

## 2023-11-24 LAB — CMP14+EGFR
ALT: 14 IU/L (ref 0–32)
AST: 13 IU/L (ref 0–40)
Albumin: 4.3 g/dL (ref 3.8–4.8)
Alkaline Phosphatase: 125 IU/L — AB (ref 44–121)
BUN/Creatinine Ratio: 12 (ref 12–28)
BUN: 16 mg/dL (ref 8–27)
Bilirubin Total: 0.2 mg/dL (ref 0.0–1.2)
CO2: 20 mmol/L (ref 20–29)
Calcium: 9.8 mg/dL (ref 8.7–10.3)
Chloride: 99 mmol/L (ref 96–106)
Creatinine, Ser: 1.38 mg/dL — AB (ref 0.57–1.00)
Globulin, Total: 3 g/dL (ref 1.5–4.5)
Glucose: 185 mg/dL — AB (ref 70–99)
Potassium: 4.8 mmol/L (ref 3.5–5.2)
Sodium: 139 mmol/L (ref 134–144)
Total Protein: 7.3 g/dL (ref 6.0–8.5)
eGFR: 40 mL/min/1.73 — AB (ref >59)

## 2023-11-24 LAB — HEMOGLOBIN A1C
Est. average glucose Bld gHb Est-mCnc: 220 mg/dL
Hgb A1c MFr Bld: 9.3 % — ABNORMAL HIGH (ref 4.8–5.6)

## 2023-11-25 ENCOUNTER — Other Ambulatory Visit (HOSPITAL_COMMUNITY): Payer: Self-pay | Admitting: Family Medicine

## 2023-11-25 ENCOUNTER — Other Ambulatory Visit: Payer: Self-pay | Admitting: Family Medicine

## 2023-11-25 DIAGNOSIS — Z1231 Encounter for screening mammogram for malignant neoplasm of breast: Secondary | ICD-10-CM

## 2023-11-25 DIAGNOSIS — J302 Other seasonal allergic rhinitis: Secondary | ICD-10-CM

## 2023-11-25 NOTE — Telephone Encounter (Signed)
 Copied from CRM [id number]. Topic: Clinical - Medication Refill >> Nov 25, 2023  5:32 PM Tiffini S wrote: Medication: mirtazapine  (REMERON ) 7.5 MG tablet,  rosuvastatin  (CRESTOR ) 10 MG tablet, montelukast  (SINGULAIR ) 10 MG tablet, allopurinol  (ZYLOPRIM ) 100 MG tablet  Has the patient contacted their pharmacy? Yes. Kathlyne with [facility]  (Agent: If no, request that the patient contact the pharmacy for the refill. If patient does not wish to contact the pharmacy document the reason why and proceed with request.) (Agent: If yes, when and what did the pharmacy advise?)  This is the patient's preferred pharmacy:   [facility] [place] - [address] [place] [address] Phone: [phone] Fax: [phone]  Is this the correct pharmacy for this prescription? Yes If no, delete pharmacy and type the correct one.   Has the prescription been filled recently? Yes  Is the patient out of the medication? Unsure  Has the patient been seen for an appointment in the last year OR does the patient have an upcoming appointment? Yes  Can we respond through MyChart? Yes  Agent: Please be advised that Rx refills may take up to 3 business days. We ask that you follow-up with your pharmacy.

## 2023-11-28 ENCOUNTER — Telehealth: Payer: Self-pay

## 2023-11-28 MED ORDER — ALLOPURINOL 100 MG PO TABS: 100.0000 mg | ORAL_TABLET | Freq: Every day | ORAL | 6 refills | Status: AC

## 2023-11-28 MED ORDER — MONTELUKAST SODIUM 10 MG PO TABS: ORAL_TABLET | ORAL | 5 refills | Status: DC

## 2023-11-28 MED ORDER — MIRTAZAPINE 7.5 MG PO TABS: ORAL_TABLET | ORAL | 1 refills | Status: DC

## 2023-11-28 MED ORDER — ROSUVASTATIN CALCIUM 10 MG PO TABS: 10.0000 mg | ORAL_TABLET | Freq: Every day | ORAL | 3 refills | Status: AC

## 2023-11-28 NOTE — Telephone Encounter (Signed)
 Copied from CRM [id number]. Topic: Clinical - Prescription Issue >> Nov 28, 2023 11:40 AM [name] wrote: Reason for CRM: Patient calling, states per insurance will no longer cover the current blood sugar meter that she is currently using. Requesting a new prescription for another meter that insurance will cover. Patient states she had [facility]. Per insurance, will need this updated new prescription prior to December 11, 2023, so that patient will not have to pay for meter.   Current meter: glucose blood (ONETOUCH ULTRA) test strip  Preferred pharmacy:  [facility] [place] [address] [place] [address] Phone: [phone] Fax: [phone]   Patient can be reached at [phone] to discuss further.

## 2023-11-29 ENCOUNTER — Other Ambulatory Visit: Payer: Self-pay

## 2023-11-29 MED ORDER — BLOOD GLUCOSE MONITORING SUPPL DEVI: 1.0000 | Freq: Three times a day (TID) | 0 refills | Status: AC

## 2023-11-29 MED ORDER — LANCETS MISC. MISC: 1.0000 | Freq: Three times a day (TID) | 0 refills | Status: AC

## 2023-11-29 MED ORDER — BLOOD GLUCOSE TEST VI STRP: 1.0000 | ORAL_STRIP | Freq: Three times a day (TID) | 0 refills | Status: AC

## 2023-11-29 MED ORDER — LANCET DEVICE MISC: 1.0000 | Freq: Three times a day (TID) | 0 refills | Status: AC

## 2023-11-29 NOTE — Telephone Encounter (Signed)
 New rx for Accu Check meter kit sent to mail pharmacy, pt aware

## 2023-12-01 ENCOUNTER — Other Ambulatory Visit: Payer: Self-pay | Admitting: Family Medicine

## 2023-12-01 MED ORDER — SURE COMFORT PEN NEEDLES 31G X 8 MM MISC: 1.0000 | Freq: Every day | 0 refills | Status: DC

## 2023-12-01 NOTE — Telephone Encounter (Signed)
 Copied from CRM [id number]. Topic: Clinical - Medication Refill >> Dec 01, 2023 11:04 AM Emylou G wrote: Medication: Insulin  Pen Needle (SURE COMFORT PEN NEEDLES) 31G X 8 MM MISC insulin  glargine (LANTUS  SOLOSTAR) 100 UNIT/ML Solostar Pen mirtazapine  (REMERON ) 7.5 MG tablet   Has the patient contacted their pharmacy? Yes (Agent: If no, request that the patient contact the pharmacy for the refill. If patient does not wish to contact the pharmacy document the reason why and proceed with request.) (Agent: If yes, when and what did the pharmacy advise?) they called  This is the patient's preferred pharmacy:   [facility] [place] [address] [place] [address] Phone: [phone] Fax: [phone]  Is this the correct pharmacy for this prescription? Yes If no, delete pharmacy and type the correct one.   Has the prescription been filled recently? No  Is the patient out of the medication? Yes  Has the patient been seen for an appointment in the last year OR does the patient have an upcoming appointment? Yes  Can we respond through MyChart? No  Agent: Please be advised that Rx refills may take up to 3 business days. We ask that you follow-up with your pharmacy.

## 2023-12-01 NOTE — Telephone Encounter (Signed)
 Last OV 09/22/23

## 2023-12-12 ENCOUNTER — Telehealth: Payer: Self-pay

## 2023-12-12 DIAGNOSIS — I071 Rheumatic tricuspid insufficiency: Secondary | ICD-10-CM | POA: Insufficient documentation

## 2023-12-12 MED ORDER — LANTUS SOLOSTAR 100 UNIT/ML ~~LOC~~ SOPN: PEN_INJECTOR | SUBCUTANEOUS | 3 refills | Status: AC

## 2023-12-12 NOTE — Assessment & Plan Note (Signed)
 Not at goal refer Cardiologyu DASH diet and commitment to daily physical activity for a minimum of 30 minutes discussed and encouraged, as a part of hypertension management. The importance of attaining a healthy weight is also discussed.     11/22/2023   10:57 AM 11/22/2023   10:23 AM 09/22/2023    2:18 PM 09/22/2023    1:58 PM 08/16/2023    1:24 PM 08/16/2023    1:02 PM 04/15/2023    2:20 PM  BP/Weight  Systolic BP 148 162 150 154 144 145 140  Diastolic BP 82 70 80 71 70 66 74  Wt. (Lbs)  231.12  232.12  229   BMI  37.3 kg/m2  38.63 kg/m2  36.96 kg/m2

## 2023-12-12 NOTE — Assessment & Plan Note (Signed)
  Patient re-educated about  the importance of commitment to a  minimum of 150 minutes of exercise per week as able.  The importance of healthy food choices with portion control discussed, as well as eating regularly and within a 12 hour window most days. The need to choose clean , green food 50 to 75% of the time is discussed, as well as to make water the primary drink and set a goal of 64 ounces water daily.       11/22/2023   10:23 AM 09/22/2023    1:58 PM 08/16/2023    1:02 PM  Weight /BMI  Weight 231 lb 1.9 oz 232 lb 1.9 oz 229 lb  Height 5' 6 (1.676 m) 5' 5 (1.651 m) 5' 6 (1.676 m)  BMI 37.3 kg/m2 38.63 kg/m2 36.96 kg/m2    unchanged

## 2023-12-12 NOTE — Telephone Encounter (Signed)
 Copied from CRM [id number]. Topic: Clinical - Prescription Issue >> Dec 09, 2023  5:00 PM Turkey B wrote: Reason for CRM: pharmacist, margaret called in needs clarification on how many units for lantus 

## 2023-12-12 NOTE — Assessment & Plan Note (Signed)
 Hyperlipidemia:Low fat diet discussed and encouraged.   Lipid Panel  Lab Results  Component Value Date   CHOL 114 08/16/2023   HDL 43 08/16/2023   LDLCALC 40 08/16/2023   TRIG 191 (H) 08/16/2023   CHOLHDL 2.5 07/21/2022     Needs to reduce fat in diet

## 2023-12-12 NOTE — Assessment & Plan Note (Addendum)
 Managed by Nephrology stable, EGFR 40 in 2025, needs to return to Nephrology, she understands

## 2023-12-12 NOTE — Assessment & Plan Note (Signed)
 Refer to cardiology for re eval

## 2023-12-12 NOTE — Progress Notes (Signed)
 Kathleen [name]     MRN: [id number]      DOB: 1950-10-16  Chief Complaint  Patient presents with   Medical Management of Chronic Issues    2 month follow up     HPI Kathleen [name] is here for follow up and re-evaluation of chronic medical conditions, medication management and review of any available recent lab and radiology data.  Preventive health is updated, specifically  Cancer screening and Immunization.   Questions or concerns regarding consultations or procedures which the PT has had in the interim are  addressed. The PT denies any adverse reactions to current medications since the last visit.  Denies polyuria, polydipsia, blurred vision , or hypoglycemic episodes. Not testing regularly   ROS Denies recent fever or chills. Denies sinus pressure, nasal congestion, ear pain or sore throat. Denies chest congestion, productive cough or wheezing. Denies chest pains, palpitations and leg swelling Denies abdominal pain, nausea, vomiting,diarrhea or constipation.   Denies dysuria, frequency, hesitancy or incontinence. Denies uncontrolled  joint pain, swelling and limitation in mobility. Denies headaches, seizures, numbness, or tingling. Denies depression, anxiety or insomnia. Denies skin break down or rash.   PE  BP (!) 148/82   Pulse 98   Resp 18   Ht 5' 6 (1.676 m)   Wt 231 lb 1.9 oz (104.8 kg)   SpO2 98%   BMI 37.30 kg/m   Patient alert and oriented and in no cardiopulmonary distress.  HEENT: No facial asymmetry, EOMI,     Neck supple .  Chest: Clear to auscultation bilaterally.  CVS: S1, S2  murmur present no S3.Regular rate.  ABD: Soft non tender.   Ext: No edema  MS: Adequate ROM spine, shoulders, hips and knees.  Skin: Intact, no ulcerations or rash noted.  Psych: Good eye contact, normal affect. Memory intact not anxious or depressed appearing.  CNS: CN 2-12 intact, power,  normal throughout.no focal deficits noted.   Assessment & Plan  Type 2  diabetes mellitus with other specified complication (HCC) Diabetes associated with hypertension, hyperlipidemia, and heart disease  Kathleen [name] is reminded of the importance of commitment to daily physical activity for 30 minutes or more, as able and the need to limit carbohydrate intake to 30 to 60 grams per meal to help with blood sugar control.   The need to take medication as prescribed, test blood sugar as directed, and to call between visits if there is a concern that blood sugar is uncontrolled is also discussed.   Kathleen [name] is reminded of the importance of daily foot exam, annual eye examination, and good blood sugar, blood pressure and cholesterol control.     Latest Ref Rng & Units 11/22/2023   11:16 AM 08/16/2023    1:49 PM 02/02/2023   10:22 AM 07/21/2022    9:25 AM 02/04/2022    2:33 PM  Diabetic Labs  HbA1c 4.8 - 5.6 % 9.3  8.8  7.6  9.7    Micro/Creat Ratio 0 - 29 mg/g creat  7   34    Chol 100 - 199 mg/dL  885   91    HDL >60 mg/dL  43   36    Calc LDL 0 - 99 mg/dL  40   40    Triglycerides 0 - 149 mg/dL  808   72    Creatinine 0.57 - 1.00 mg/dL 8.61  8.62  8.76  8.49  1.46       11/22/2023   10:57  AM 11/22/2023   10:23 AM 09/22/2023    2:18 PM 09/22/2023    1:58 PM 08/16/2023    1:24 PM 08/16/2023    1:02 PM 04/15/2023    2:20 PM  BP/Weight  Systolic BP 148 162 150 154 144 145 140  Diastolic BP 82 70 80 71 70 66 74  Wt. (Lbs)  231.12  232.12  229   BMI  37.3 kg/m2  38.63 kg/m2  36.96 kg/m2       Latest Ref Rng & Units 03/28/2023   12:00 AM 09/17/2021    1:40 PM  Foot/eye exam completion dates  Eye Exam No Retinopathy No Retinopathy       Foot Form Completion   Done     This result is from an external source.   Deteriorated, needs Endo but resisting, return in 6 weeks with log, follow diet and test 3 times daily, call with problems     Resistant hypertension Not at goal refer Cardiologyu DASH diet and commitment to daily physical activity for a minimum of 30  minutes discussed and encouraged, as a part of hypertension management. The importance of attaining a healthy weight is also discussed.     11/22/2023   10:57 AM 11/22/2023   10:23 AM 09/22/2023    2:18 PM 09/22/2023    1:58 PM 08/16/2023    1:24 PM 08/16/2023    1:02 PM 04/15/2023    2:20 PM  BP/Weight  Systolic BP 148 162 150 154 144 145 140  Diastolic BP 82 70 80 71 70 66 74  Wt. (Lbs)  231.12  232.12  229   BMI  37.3 kg/m2  38.63 kg/m2  36.96 kg/m2        Severe tricuspid regurgitation Refer to cardiology for re eval  Hyperlipidemia LDL goal <100 Hyperlipidemia:Low fat diet discussed and encouraged.   Lipid Panel  Lab Results  Component Value Date   CHOL 114 08/16/2023   HDL 43 08/16/2023   LDLCALC 40 08/16/2023   TRIG 191 (H) 08/16/2023   CHOLHDL 2.5 07/21/2022     Needs to reduce fat in diet   Acute massive pulmonary embolism (HCC) Lifetime eliquis , no complications  Morbid obesity (HCC)  Patient re-educated about  the importance of commitment to a  minimum of 150 minutes of exercise per week as able.  The importance of healthy food choices with portion control discussed, as well as eating regularly and within a 12 hour window most days. The need to choose clean , green food 50 to 75% of the time is discussed, as well as to make water the primary drink and set a goal of 64 ounces water daily.       11/22/2023   10:23 AM 09/22/2023    1:58 PM 08/16/2023    1:02 PM  Weight /BMI  Weight 231 lb 1.9 oz 232 lb 1.9 oz 229 lb  Height 5' 6 (1.676 m) 5' 5 (1.651 m) 5' 6 (1.676 m)  BMI 37.3 kg/m2 38.63 kg/m2 36.96 kg/m2    unchanged  Chronic kidney disease, stage 3 unspecified (HCC) Managed by Nephrology stable, EGFR 40 in 2025, needs to return to Nephrology, she understands    Kathleen [name]     MRN: [id number]      DOB: 05-Aug-1950  Chief Complaint  Patient presents with   Medical Management of Chronic Issues    2 month follow up     HPI Kathleen [name]  is here for follow up  and re-evaluation of chronic medical conditions, medication management and review of any available recent lab and radiology data.  Preventive health is updated, specifically  Cancer screening and Immunization.   Questions or concerns regarding consultations or procedures which the PT has had in the interim are  addressed. The PT denies any adverse reactions to current medications since the last visit.  There are no new concerns.  There are no specific complaints   ROS Denies recent fever or chills. Denies sinus pressure, nasal congestion, ear pain or sore throat. Denies chest congestion, productive cough or wheezing. Denies chest pains, palpitations and leg swelling Denies abdominal pain, nausea, vomiting,diarrhea or constipation.   Denies dysuria, frequency, hesitancy or incontinence. Denies joint pain, swelling and limitation in mobility. Denies headaches, seizures, numbness, or tingling. Denies depression, anxiety or insomnia. Denies skin break down or rash.   PE  BP (!) 148/82   Pulse 98   Resp 18   Ht 5' 6 (1.676 m)   Wt 231 lb 1.9 oz (104.8 kg)   SpO2 98%   BMI 37.30 kg/m   Patient alert and oriented and in no cardiopulmonary distress.  HEENT: No facial asymmetry, EOMI,     Neck supple .  Chest: Clear to auscultation bilaterally.  CVS: S1, S2 no murmurs, no S3.Regular rate.  ABD: Soft non tender.   Ext: No edema  MS: Adequate ROM spine, shoulders, hips and knees.  Skin: Intact, no ulcerations or rash noted.  Psych: Good eye contact, normal affect. Memory intact not anxious or depressed appearing.  CNS: CN 2-12 intact, power,  normal throughout.no focal deficits noted.   Assessment & Plan  Type 2 diabetes mellitus with other specified complication (HCC) Diabetes associated with hypertension, hyperlipidemia, and heart disease  Kathleen [name] is reminded of the importance of commitment to daily physical activity for 30 minutes or more,  as able and the need to limit carbohydrate intake to 30 to 60 grams per meal to help with blood sugar control.   The need to take medication as prescribed, test blood sugar as directed, and to call between visits if there is a concern that blood sugar is uncontrolled is also discussed.   Kathleen [name] is reminded of the importance of daily foot exam, annual eye examination, and good blood sugar, blood pressure and cholesterol control.     Latest Ref Rng & Units 11/22/2023   11:16 AM 08/16/2023    1:49 PM 02/02/2023   10:22 AM 07/21/2022    9:25 AM 02/04/2022    2:33 PM  Diabetic Labs  HbA1c 4.8 - 5.6 % 9.3  8.8  7.6  9.7    Micro/Creat Ratio 0 - 29 mg/g creat  7   34    Chol 100 - 199 mg/dL  885   91    HDL >60 mg/dL  43   36    Calc LDL 0 - 99 mg/dL  40   40    Triglycerides 0 - 149 mg/dL  808   72    Creatinine 0.57 - 1.00 mg/dL 8.61  8.62  8.76  8.49  1.46       11/22/2023   10:57 AM 11/22/2023   10:23 AM 09/22/2023    2:18 PM 09/22/2023    1:58 PM 08/16/2023    1:24 PM 08/16/2023    1:02 PM 04/15/2023    2:20 PM  BP/Weight  Systolic BP 148 162 150 154 144 145 140  Diastolic BP 82 70 80  71 70 66 74  Wt. (Lbs)  231.12  232.12  229   BMI  37.3 kg/m2  38.63 kg/m2  36.96 kg/m2       Latest Ref Rng & Units 03/28/2023   12:00 AM 09/17/2021    1:40 PM  Foot/eye exam completion dates  Eye Exam No Retinopathy No Retinopathy       Foot Form Completion   Done     This result is from an external source.   Deteriorated, needs Endo but resisting, return in 6 weeks with log, follow diet and test 3 times daily, call with problems     Resistant hypertension Not at goal refer Cardiologyu DASH diet and commitment to daily physical activity for a minimum of 30 minutes discussed and encouraged, as a part of hypertension management. The importance of attaining a healthy weight is also discussed.     11/22/2023   10:57 AM 11/22/2023   10:23 AM 09/22/2023    2:18 PM 09/22/2023    1:58 PM 08/16/2023     1:24 PM 08/16/2023    1:02 PM 04/15/2023    2:20 PM  BP/Weight  Systolic BP 148 162 150 154 144 145 140  Diastolic BP 82 70 80 71 70 66 74  Wt. (Lbs)  231.12  232.12  229   BMI  37.3 kg/m2  38.63 kg/m2  36.96 kg/m2        Severe tricuspid regurgitation Refer to cardiology for re eval  Hyperlipidemia LDL goal <100 Hyperlipidemia:Low fat diet discussed and encouraged.   Lipid Panel  Lab Results  Component Value Date   CHOL 114 08/16/2023   HDL 43 08/16/2023   LDLCALC 40 08/16/2023   TRIG 191 (H) 08/16/2023   CHOLHDL 2.5 07/21/2022     Needs to reduce fat in diet   Acute massive pulmonary embolism (HCC) Lifetime eliquis , no complications  Morbid obesity (HCC)  Patient re-educated about  the importance of commitment to a  minimum of 150 minutes of exercise per week as able.  The importance of healthy food choices with portion control discussed, as well as eating regularly and within a 12 hour window most days. The need to choose clean , green food 50 to 75% of the time is discussed, as well as to make water the primary drink and set a goal of 64 ounces water daily.       11/22/2023   10:23 AM 09/22/2023    1:58 PM 08/16/2023    1:02 PM  Weight /BMI  Weight 231 lb 1.9 oz 232 lb 1.9 oz 229 lb  Height 5' 6 (1.676 m) 5' 5 (1.651 m) 5' 6 (1.676 m)  BMI 37.3 kg/m2 38.63 kg/m2 36.96 kg/m2    unchanged  Chronic kidney disease, stage 3 unspecified (HCC) Managed by Nephrology stable, EGFR 40 in 2025, needs to return to Nephrology, she understands

## 2023-12-12 NOTE — Assessment & Plan Note (Addendum)
 Diabetes associated with hypertension, hyperlipidemia, and heart disease  Kathleen [name] is reminded of the importance of commitment to daily physical activity for 30 minutes or more, as able and the need to limit carbohydrate intake to 30 to 60 grams per meal to help with blood sugar control.   The need to take medication as prescribed, test blood sugar as directed, and to call between visits if there is a concern that blood sugar is uncontrolled is also discussed.   Kathleen [name] is reminded of the importance of daily foot exam, annual eye examination, and good blood sugar, blood pressure and cholesterol control.     Latest Ref Rng & Units 11/22/2023   11:16 AM 08/16/2023    1:49 PM 02/02/2023   10:22 AM 07/21/2022    9:25 AM 02/04/2022    2:33 PM  Diabetic Labs  HbA1c 4.8 - 5.6 % 9.3  8.8  7.6  9.7    Micro/Creat Ratio 0 - 29 mg/g creat  7   34    Chol 100 - 199 mg/dL  885   91    HDL >60 mg/dL  43   36    Calc LDL 0 - 99 mg/dL  40   40    Triglycerides 0 - 149 mg/dL  808   72    Creatinine 0.57 - 1.00 mg/dL 8.61  8.62  8.76  8.49  1.46       11/22/2023   10:57 AM 11/22/2023   10:23 AM 09/22/2023    2:18 PM 09/22/2023    1:58 PM 08/16/2023    1:24 PM 08/16/2023    1:02 PM 04/15/2023    2:20 PM  BP/Weight  Systolic BP 148 162 150 154 144 145 140  Diastolic BP 82 70 80 71 70 66 74  Wt. (Lbs)  231.12  232.12  229   BMI  37.3 kg/m2  38.63 kg/m2  36.96 kg/m2       Latest Ref Rng & Units 03/28/2023   12:00 AM 09/17/2021    1:40 PM  Foot/eye exam completion dates  Eye Exam No Retinopathy No Retinopathy       Foot Form Completion   Done     This result is from an external source.   Deteriorated, needs Endo but resisting, return in 6 weeks with log, follow diet and test 3 times daily, call with problems

## 2023-12-12 NOTE — Telephone Encounter (Signed)
Clarification given to pharmacist.

## 2023-12-12 NOTE — Assessment & Plan Note (Signed)
 Lifetime eliquis , no complications

## 2023-12-14 ENCOUNTER — Telehealth: Payer: Self-pay

## 2023-12-14 ENCOUNTER — Other Ambulatory Visit: Payer: Self-pay

## 2023-12-14 MED ORDER — LANCET DEVICE MISC: 1.0000 | Freq: Three times a day (TID) | 0 refills | Status: AC

## 2023-12-14 MED ORDER — LANCETS MISC. MISC: 1.0000 | Freq: Three times a day (TID) | 0 refills | Status: AC

## 2023-12-14 MED ORDER — BLOOD GLUCOSE MONITORING SUPPL DEVI
1.0000 | Freq: Three times a day (TID) | 0 refills | Status: AC
Start: 2023-12-14 — End: ?

## 2023-12-14 MED ORDER — BLOOD GLUCOSE TEST VI STRP: 1.0000 | ORAL_STRIP | Freq: Three times a day (TID) | 0 refills | Status: AC

## 2023-12-14 NOTE — Telephone Encounter (Signed)
 Copied from CRM #[id number]. Topic: Clinical - Medical Advice >> Dec 14, 2023  2:03 PM Mia F wrote: Reason for CRM:Pt says she was advised to see a cardiologist by Dr Antonetta. The cardiologist pt was referred  to cannot see her until December 2025. PT feels that is too long to wait. Please advise

## 2023-12-14 NOTE — Telephone Encounter (Signed)
 Sent for preferred brand

## 2023-12-14 NOTE — Telephone Encounter (Signed)
 Copied from CRM [id number]. Topic: Clinical - Prescription Issue >> Nov 28, 2023 11:40 AM [name] wrote: Reason for CRM: Patient calling, states per insurance will no longer cover the current blood sugar meter that she is currently using. Requesting a new prescription for another meter that insurance will cover. Patient states she had [facility]. Per insurance, will need this updated new prescription prior to December 11, 2023, so that patient will not have to pay for meter.   Current meter: glucose blood (ONETOUCH ULTRA) test strip  Preferred pharmacy:  [facility] [place] [address] [place] [address] Phone: [phone] Fax: [phone]   Patient can be reached at [phone] to discuss further.

## 2023-12-30 NOTE — Progress Notes (Signed)
 Pharmacy Quality Measure Review  This patient is appearing on a report for being at risk of failing the adherence measure for cholesterol (statin) medications this calendar year.   Medication: rosuvastatin  10 mg daily Last fill date: 12/20/23 for 30 day supply  Insurance report was not up to date. No action needed at this time.   Woodie Jock, PharmD PGY1 Pharmacy Resident

## 2024-01-04 ENCOUNTER — Encounter: Payer: Self-pay | Admitting: Family Medicine

## 2024-01-04 ENCOUNTER — Ambulatory Visit (INDEPENDENT_AMBULATORY_CARE_PROVIDER_SITE_OTHER): Admitting: Family Medicine

## 2024-01-04 VITALS — BP 150/70 | HR 93 | Resp 16 | Ht 66.0 in | Wt 235.0 lb

## 2024-01-04 DIAGNOSIS — I071 Rheumatic tricuspid insufficiency: Secondary | ICD-10-CM | POA: Diagnosis not present

## 2024-01-04 DIAGNOSIS — I1A0 Resistant hypertension: Secondary | ICD-10-CM | POA: Diagnosis not present

## 2024-01-04 DIAGNOSIS — E785 Hyperlipidemia, unspecified: Secondary | ICD-10-CM

## 2024-01-04 DIAGNOSIS — E118 Type 2 diabetes mellitus with unspecified complications: Secondary | ICD-10-CM

## 2024-01-04 NOTE — Patient Instructions (Addendum)
 F/U In January  Pls sched appt for flu vaccine at checkout Need to make and keep appointment with kidney doctor, also to [facility] for diabetes    Test blood sugar first thing in morning before your bath, range is 80 to 140  Test blood sugar last thing at night between 9 and 10, range is 140 to 180   You are being referred to Diabetic educator important that you go, you need to eat on a regular schedule  I will see if a sooner appointment with cardiology is possible, take hydralazine  at 6 am, 2pm and 10 pm  Thanks for choosing [facility], we consider it a privelige to serve you.

## 2024-01-05 ENCOUNTER — Ambulatory Visit: Payer: Self-pay | Admitting: Family Medicine

## 2024-01-11 ENCOUNTER — Ambulatory Visit (INDEPENDENT_AMBULATORY_CARE_PROVIDER_SITE_OTHER)

## 2024-01-11 VITALS — Ht 66.0 in | Wt 235.0 lb

## 2024-01-11 DIAGNOSIS — Z0000 Encounter for general adult medical examination without abnormal findings: Secondary | ICD-10-CM | POA: Diagnosis not present

## 2024-01-11 DIAGNOSIS — Z780 Asymptomatic menopausal state: Secondary | ICD-10-CM

## 2024-01-11 NOTE — Patient Instructions (Signed)
 [name] , Thank you for taking time out of your busy schedule to complete your Annual Wellness Visit with me. I enjoyed our conversation and look forward to speaking with you again next year. I, as well as your care team,  appreciate your ongoing commitment to your health goals. Please review the following plan we discussed and let me know if I can assist you in the future.  Your Game plan/ To Do List  Referrals/Orders: If you haven't heard from the office you've been referred to, please reach out to them at the phone provided.   To Schedule Your Osteoporosis Screening, Call: [facility] @ Phone: [phone]   Follow up Visits: We will see or speak with you next year for your Next Medicare AWV with our clinical staff  Clinician Recommendations:  Aim for 30 minutes of exercise or brisk walking, 6-8 glasses of water, and 5 servings of fruits and vegetables each day.    Wishing you many blessings and good health during the next year until our next visit.  -Saadiya Wilfong   This is a list of the screenings recommended for you:  Health Maintenance  Topic Date Due   Zoster (Shingles) Vaccine (1 of 2) 04/26/1968   DEXA scan (bone density measurement)  08/18/2016   Flu Shot  12/09/2023   COVID-19 Vaccine (4 - 2025-26 season) 01/09/2024   DTaP/Tdap/Td vaccine (2 - Tdap) 08/15/2024*   Eye exam for diabetics  03/27/2024   Mammogram  04/20/2024   Complete foot exam   05/01/2024   Hemoglobin A1C  05/24/2024   Yearly kidney health urinalysis for diabetes  08/15/2024   Cologuard (Stool DNA test)  10/12/2024   Yearly kidney function blood test for diabetes  11/21/2024   Medicare Annual Wellness Visit  01/10/2025   Pneumococcal Vaccine for age over 4  Completed   Hepatitis C Screening  Completed   HPV Vaccine  Aged Out   Meningitis B Vaccine  Aged Out   Colon Cancer Screening  Discontinued  *Topic was postponed. The date shown is not the original due date.    Advanced directives:  (Declined) Advance directive discussed with you today. Even though you declined this today, please call our office should you change your mind, and we can give you the proper paperwork for you to fill out. Advance Care Planning is important because it:  [x]  Makes sure you receive the medical care that is consistent with your values, goals, and preferences  [x]  It provides guidance to your family and loved ones and reduces their decisional burden about whether or not they are making the right decisions based on your wishes.  Follow the link provided in your after visit summary or read over the paperwork we have mailed to you to help you started getting your Advance Directives in place. If you need assistance in completing these, please reach out to us  so that we can help you!  Information on Advanced Care Planning can be found at [place]  Secretary of [facility] [employer] Advance Health Care Directives Advance Health Care Directives ([email or web address]   See attachments for Preventive Care and Fall Prevention Tips.

## 2024-01-11 NOTE — Progress Notes (Signed)
 Subjective:   Kathleen [name] is a 73 y.o. who presents for a Medicare Wellness preventive visit.  As a reminder, Annual Wellness Visits don't include a physical exam, and some assessments may be limited, especially if this visit is performed virtually. We may recommend an in-person follow-up visit with your provider if needed.  Visit Complete: Virtual I connected with  [name] on 01/11/24 by a audio enabled telemedicine application and verified that I am speaking with the correct person using two identifiers.  Patient Location: Home  Provider Location: Home Office  I discussed the limitations of evaluation and management by telemedicine. The patient expressed understanding and agreed to proceed.  Vital Signs: Because this visit was a virtual/telehealth visit, some criteria may be missing or patient reported. Any vitals not documented were not able to be obtained and vitals that have been documented are patient reported.  VideoDeclined- This patient declined Librarian, academic. Therefore the visit was completed with audio only.  Persons Participating in Visit: Patient.  AWV Questionnaire: No: Patient Medicare AWV questionnaire was not completed prior to this visit.  Cardiac Risk Factors include: advanced age (>69men, >74 women);diabetes mellitus;dyslipidemia;hypertension;obesity (BMI >30kg/m2);sedentary lifestyle     Objective:    Today's Vitals   01/11/24 1249  Weight: 235 lb (106.6 kg)  Height: 5' 6 (1.676 m)   Body mass index is 37.93 kg/m.     01/11/2024   12:46 PM 12/10/2022    2:04 PM 01/07/2022    8:38 AM 12/11/2021   10:36 AM 12/04/2021    1:38 PM 05/14/2021    5:31 PM 01/19/2021   10:41 AM  Advanced Directives  Does Patient Have a Medical Advance Directive? No No No No No No No  Would patient like information on creating a medical advance directive? No - Patient declined No - Patient declined No - Patient declined No - Patient  declined No - Patient declined No - Patient declined No - Patient declined    Current Medications (verified) Outpatient Encounter Medications as of 01/11/2024  Medication Sig   allopurinol  (ZYLOPRIM ) 100 MG tablet Take 1 tablet (100 mg total) by mouth daily.   amLODipine  (NORVASC ) 10 MG tablet TAKE ONE TABLET BY MOUTH ONCE DAILY FOR BLOOD PRESSURE.   apixaban  (ELIQUIS ) 5 MG TABS tablet TAKE ONE TABLET BY MOUTH TWICE DAILY AT 9AM & 9PM   aspirin  EC 81 MG tablet Take 81 mg by mouth daily. Swallow whole.   Blood Glucose Monitoring Suppl DEVI 1 each by Does not apply route in the morning, at noon, and at bedtime. Patient's insurance prefers Accu-check, may substitute. DX e11.65   Blood Glucose Monitoring Suppl DEVI 1 each by Does not apply route in the morning, at noon, and at bedtime. Patient's insurance prefers Accu-Check, pease substitute to brand. DX E11.65   cholecalciferol  (VITAMIN D ) 1000 UNITS tablet Take 1,000 Units by mouth daily. Takes 2 tab daily   fluticasone  (FLONASE ) 50 MCG/ACT nasal spray Place into both nostrils daily.   furosemide  (LASIX ) 20 MG tablet Take 20 mg by mouth 2 (two) times daily.   gabapentin  (NEURONTIN ) 100 MG capsule Take 1 capsule (100 mg total) by mouth 3 (three) times daily. TAKE (1) CAPSULE BY MOUTH THREE TIMES DAILY.   glipiZIDE  (GLUCOTROL  XL) 5 MG 24 hr tablet Take 1 tablet (5 mg total) by mouth daily.   Glucose Blood (BLOOD GLUCOSE TEST STRIPS) STRP 1 each by In Vitro route in the morning, at noon, and at  bedtime. Patient's insurance prefers accu-check, may substitute. DX E11.65   Glucose Blood (BLOOD GLUCOSE TEST STRIPS) STRP 1 each by In Vitro route in the morning, at noon, and at bedtime. Patient's insurance prefers Accu-Check, pease substitute to brand. DX E11.65   glucose blood (ONETOUCH ULTRA) test strip Use as instructed   hydrALAZINE  (APRESOLINE ) 25 MG tablet Take 1 tablet (25 mg total) by mouth 3 (three) times daily.   hydrALAZINE  (APRESOLINE ) 25 MG  tablet TAKE ONE TABLET (25 MG) BY MOUTH THREE TIMES DAILY AT 9AM, 3PM AND 9PM   insulin  glargine (LANTUS  SOLOSTAR) 100 UNIT/ML Solostar Pen Report taking 50 to 55 units daily in 11/2023   Insulin  Pen Needle (SURE COMFORT PEN NEEDLES) 31G X 8 MM MISC 1 each by Does not apply route daily.   Lancet Device MISC 1 each by Does not apply route in the morning, at noon, and at bedtime. Patient's insurance prefers Accu-Check, pease substitute to brand. DX E11.65   Lancets (ONETOUCH DELICA PLUS LANCET33G) MISC USE AS DIRECTED TO TEST BLOOD GLUCOSE 3 TIMES DAILY.   Lancets Misc. MISC 1 each by Does not apply route in the morning, at noon, and at bedtime. Patient's insurance prefers Accu-Check, pease substitute to brand. DX E11.65   mirtazapine  (REMERON ) 7.5 MG tablet TAKE 1 TABLET BY MOUTH AT BEDTIME. THIS IS FOR SLEEP AND FOR YOUR APPETITE.   montelukast  (SINGULAIR ) 10 MG tablet TAKE (1) TABLET BY MOUTH AT BEDTIME.   olmesartan  (BENICAR ) 40 MG tablet Take 1 tablet (40 mg total) by mouth daily.   rosuvastatin  (CRESTOR ) 10 MG tablet Take 1 tablet (10 mg total) by mouth daily.   [DISCONTINUED] sitaGLIPtan (JANUVIA) 100 MG tablet Take 100 mg by mouth daily.     No facility-administered encounter medications on file as of 01/11/2024.    Allergies (verified) Patient has no known allergies.   History: Past Medical History:  Diagnosis Date   Acute respiratory failure with hypoxia (HCC) 10/22/2019   Anemia    Arthritis    Chronic kidney disease    Diabetes mellitus type II    Dysphagia    unspecified    Encounter for immunization 05/02/2023   GERD (gastroesophageal reflux disease)    Hyperlipidemia 2000   Hypertension 1995   Insomnia    Low back pain    Obesity    PE (pulmonary thromboembolism) (HCC)    Sleep apnea in adult 11/25/2019   Thyroid  nodule 11/25/2019   Past Surgical History:  Procedure Laterality Date   BREAST BIOPSY Right 01/19/2022   X 2-Fat necrosis and foreign body giant cell  reaction, suggestive of previous procedure-related change.- Negative for carcinoma   BREAST BIOPSY Right 01/19/2022   Atypical lobular hyperplasia   Carpal tunnel release     left    CHOLECYSTECTOMY  05/10/2005   DIGIT NAIL REMOVAL  08/09/2011   KNEE SURGERY  05/29/2010   arthroscopy LEFT knee partial medial meniscectomy   TENOTOMY     2,3,4  left foot    toenail removal     TUBAL LIGATION     Family History  Problem Relation Age of Onset   Diabetes Mother    Hypertension Mother        MI   Heart disease Mother    Kidney disease Mother 9       dialysis   Diabetes Father    Diabetes Sister    Dementia Sister    Diabetes Brother    Diabetes Brother  Kidney disease Brother    Breast cancer Neg Hx    Social History   Socioeconomic History   Marital status: Divorced    Spouse name: Not on file   Number of children: 4   Years of education: Not on file   Highest education level: 11th grade  Occupational History   Occupation: Full time Technical brewer   Tobacco Use   Smoking status: Never   Smokeless tobacco: Never  Vaping Use   Vaping status: Never Used  Substance and Sexual Activity   Alcohol use: No   Drug use: No   Sexual activity: Yes  Other Topics Concern   Not on file  Social History Narrative   Lives with 2nd oldest son   Social Drivers of Health   Financial Resource Strain: Low Risk  (01/11/2024)   Overall Financial Resource Strain (CARDIA)    Difficulty of Paying Living Expenses: Not hard at all  Food Insecurity: No Food Insecurity (01/11/2024)   Hunger Vital Sign    Worried About Running Out of Food in the Last Year: Never true    Ran Out of Food in the Last Year: Never true  Transportation Needs: No Transportation Needs (01/11/2024)   PRAPARE - Administrator, Civil Service (Medical): No    Lack of Transportation (Non-Medical): No  Physical Activity: Insufficiently Active (01/11/2024)   Exercise Vital Sign    Days of Exercise per Week: 3 days     Minutes of Exercise per Session: 30 min  Stress: No Stress Concern Present (01/11/2024)   [employer] of Occupational Health - Occupational Stress Questionnaire    Feeling of Stress: Not at all  Social Connections: Moderately Isolated (01/11/2024)   Social Connection and Isolation Panel    Frequency of Communication with Friends and Family: More than three times a week    Frequency of Social Gatherings with Friends and Family: More than three times a week    Attends Religious Services: More than 4 times per year    Active Member of [employer] or Organizations: No    Attends Engineer, structural: Never    Marital Status: Divorced    Tobacco Counseling Counseling given: Yes    Clinical Intake:  Pre-visit preparation completed: Yes  Pain : No/denies pain     BMI - recorded: 37.93 Nutritional Status: BMI > 30  Obese Nutritional Risks: None Diabetes: Yes CBG done?: No Did pt. bring in CBG monitor from home?: No  Lab Results  Component Value Date   HGBA1C 9.3 (H) 11/22/2023   HGBA1C 8.8 (H) 08/16/2023   HGBA1C 7.6 (H) 02/02/2023     How often do you need to have someone help you when you read instructions, pamphlets, or other written materials from your doctor or pharmacy?: 1 - Never  Interpreter Needed?: No  Information entered by :: Khady Vandenberg W CMA (AAMA)   Activities of Daily Living     01/11/2024    4:11 PM  In your present state of health, do you have any difficulty performing the following activities:  Hearing? 0  Vision? 0  Difficulty concentrating or making decisions? 0  Walking or climbing stairs? 0  Dressing or bathing? 0  Doing errands, shopping? 0  Preparing Food and eating ? N  Using the Toilet? N  In the past six months, have you accidently leaked urine? N  Do you have problems with loss of bowel control? N  Managing your Medications? N  Managing your Finances? N  Housekeeping or managing your Housekeeping? N    Patient Care  Team: Antonetta Rollene BRAVO, MD as PCP - General Carver, Carlin POUR, DO as Consulting Physician (Internal Medicine) Darroll Anes, DO as Referring Physician (Optometry)  I have updated your Care Teams any recent Medical Services you may have received from other providers in the past year.     Assessment:   This is a routine wellness examination for Cheryln.  Hearing/Vision screen Hearing Screening - Comments:: Patient denies any hearing difficulties.   Vision Screening - Comments:: Wears rx glasses - up to date with routine eye exams with  Anes Darroll at [facility] [place] location   Goals Addressed               This Visit's Progress     Remain Active (pt-stated)        Remain as active and healthy as I can be.        Depression Screen     01/11/2024    4:17 PM 01/04/2024   10:35 AM 11/22/2023   10:26 AM 09/22/2023    2:04 PM 08/16/2023    1:04 PM 04/15/2023    1:26 PM 12/10/2022    2:04 PM  PHQ 2/9 Scores  PHQ - 2 Score 0 0 0 0 0 0 0  PHQ- 9 Score 0          Fall Risk     01/11/2024    4:07 PM 01/04/2024   10:35 AM 11/22/2023   10:26 AM 09/22/2023    2:04 PM 08/16/2023    1:04 PM  Fall Risk   Falls in the past year? 0 0 1 0 0  Number falls in past yr: 0 0 0 0 0  Injury with Fall? 0 0 0 0 0  Risk for fall due to : No Fall Risks      Follow up Falls evaluation completed;Education provided;Falls prevention discussed Falls evaluation completed Falls evaluation completed Falls evaluation completed     MEDICARE RISK AT HOME:  Medicare Risk at Home Any stairs in or around the home?: No If so, are there any without handrails?: No Home free of loose throw rugs in walkways, pet beds, electrical cords, etc?: Yes Adequate lighting in your home to reduce risk of falls?: Yes Life alert?: No Use of a cane, walker or w/c?: Yes Grab bars in the bathroom?: Yes Shower chair or bench in shower?: Yes Elevated toilet seat or a handicapped toilet?: No  TIMED UP AND GO:  Was the  test performed?  No  Cognitive Function: 6CIT completed    12/03/2020   10:56 AM  MMSE - Mini Mental State Exam  Not completed: Unable to complete        01/11/2024    4:11 PM 12/10/2022    2:05 PM 12/04/2021    1:39 PM 12/03/2020   10:56 AM 12/03/2019   11:07 AM  6CIT Screen  What Year? 0 points 0 points 0 points 0 points 0 points  What month? 0 points 0 points 0 points 0 points 0 points  What time? 0 points 0 points 0 points 0 points 0 points  Count back from 20 0 points 0 points 0 points 0 points 0 points  Months in reverse 0 points 2 points 2 points 4 points 0 points  Repeat phrase 0 points 0 points 0 points 0 points 2 points  Total Score 0 points 2 points 2 points 4 points 2 points  Immunizations Immunization History  Administered Date(s) Administered   Fluad Quad(high Dose 65+) 03/06/2019, 01/30/2020, 01/08/2021, 02/04/2022   Fluad Trivalent(High Dose 65+) 04/15/2023   Influenza Split 02/23/2011, 03/14/2012   Influenza Whole 03/24/2007, 02/21/2008, 02/26/2010   Influenza,inj,Quad PF,6+ Mos 04/23/2014, 04/15/2015, 12/29/2015, 02/14/2017, 12/27/2017   Moderna SARS-COV2 Booster Vaccination 11/28/2020   Moderna Sars-Covid-2 Vaccination 07/26/2019, 08/28/2019, 06/06/2020   Pneumococcal Conjugate-13 12/26/2013   Pneumococcal Polysaccharide-23 11/29/2008, 08/12/2015   Td 02/03/2009   Zoster, Live 10/28/2010    Screening Tests Health Maintenance  Topic Date Due   Zoster Vaccines- Shingrix (1 of 2) 04/26/1968   DEXA SCAN  08/18/2016   INFLUENZA VACCINE  12/09/2023   COVID-19 Vaccine (4 - 2025-26 season) 01/09/2024   DTaP/Tdap/Td (2 - Tdap) 08/15/2024 (Originally 02/04/2019)   OPHTHALMOLOGY EXAM  03/27/2024   MAMMOGRAM  04/20/2024   FOOT EXAM  05/01/2024   HEMOGLOBIN A1C  05/24/2024   Diabetic kidney evaluation - Urine ACR  08/15/2024   Fecal DNA (Cologuard)  10/12/2024   Diabetic kidney evaluation - eGFR measurement  11/21/2024   Medicare Annual Wellness (AWV)   01/10/2025   Pneumococcal Vaccine: 50+ Years  Completed   Hepatitis C Screening  Completed   HPV VACCINES  Aged Out   Meningococcal B Vaccine  Aged Out   Colonoscopy  Discontinued    Health Maintenance  Health Maintenance Due  Topic Date Due   Zoster Vaccines- Shingrix (1 of 2) 04/26/1968   DEXA SCAN  08/18/2016   INFLUENZA VACCINE  12/09/2023   COVID-19 Vaccine (4 - 2025-26 season) 01/09/2024   Health Maintenance Items Addressed: DEXA ordered  Additional Screening:  Vision Screening: Recommended annual ophthalmology exams for early detection of glaucoma and other disorders of the eye. Would you like a referral to an eye doctor? No    Dental Screening: Recommended annual dental exams for proper oral hygiene  Community Resource Referral / Chronic Care Management: CRR required this visit?  No   CCM required this visit?  No   Plan:    I have personally reviewed and noted the following in the patient's chart:   Medical and social history Use of alcohol, tobacco or illicit drugs  Current medications and supplements including opioid prescriptions. Patient is not currently taking opioid prescriptions. Functional ability and status Nutritional status Physical activity Advanced directives List of other physicians Hospitalizations, surgeries, and ER visits in previous 12 months Vitals Screenings to include cognitive, depression, and falls Referrals and appointments  In addition, I have reviewed and discussed with patient certain preventive protocols, quality metrics, and best practice recommendations. A written personalized care plan for preventive services as well as general preventive health recommendations were provided to patient.   Berley Gambrell, CMA   01/11/2024   After Visit Summary: (Mail) Due to this being a telephonic visit, the after visit summary with patients personalized plan was offered to patient via mail   Notes: Nothing significant to report at this  time.

## 2024-01-12 ENCOUNTER — Ambulatory Visit

## 2024-01-17 ENCOUNTER — Encounter: Payer: Self-pay | Admitting: Family Medicine

## 2024-01-17 NOTE — Assessment & Plan Note (Addendum)
 DASH diet and commitment to daily physical activity for a minimum of 30 minutes discussed and encouraged, as a part of hypertension management. The importance of attaining a healthy weight is also discussed. Remains uncontrolled despite multiple meds has card appt upcoming     01/11/2024   12:49 PM 01/04/2024   11:16 AM 01/04/2024   11:01 AM 01/04/2024   10:32 AM 11/22/2023   10:57 AM 11/22/2023   10:23 AM 09/22/2023    2:18 PM  BP/Weight  Systolic BP -- 150 150 158 148 162 150  Diastolic BP -- 70 64 68 82 70 80  Wt. (Lbs) 235   235.04  231.12   BMI 37.93 kg/m2   37.94 kg/m2  37.3 kg/m2

## 2024-01-17 NOTE — Assessment & Plan Note (Signed)
Needs cardiology follow up 

## 2024-01-17 NOTE — Assessment & Plan Note (Signed)
  Patient re-educated about  the importance of commitment to a  minimum of 150 minutes of exercise per week as able.  The importance of healthy food choices with portion control discussed, as well as eating regularly and within a 12 hour window most days. The need to choose clean , green food 50 to 75% of the time is discussed, as well as to make water the primary drink and set a goal of 64 ounces water daily.       01/11/2024   12:49 PM 01/04/2024   10:32 AM 11/22/2023   10:23 AM  Weight /BMI  Weight 235 lb 235 lb 0.6 oz 231 lb 1.9 oz  Height 5' 6 (1.676 m) 5' 6 (1.676 m) 5' 6 (1.676 m)  BMI 37.93 kg/m2 37.94 kg/m2 37.3 kg/m2

## 2024-01-17 NOTE — Assessment & Plan Note (Signed)
 Diabetes associated with hypertension, hyperlipidemia, and CKD  Kathleen [name] is reminded of the importance of commitment to daily physical activity for 30 minutes or more, as able and the need to limit carbohydrate intake to 30 to 60 grams per meal to help with blood sugar control.  Uncontrolled and worsening, needs to be treated by Endo and agrees, also needs diabetic ed  The need to take medication as prescribed, test blood sugar as directed, and to call between visits if there is a concern that blood sugar is uncontrolled is also discussed.   Kathleen [name] is reminded of the importance of daily foot exam, annual eye examination, and good blood sugar, blood pressure and cholesterol control.     Latest Ref Rng & Units 11/22/2023   11:16 AM 08/16/2023    1:49 PM 02/02/2023   10:22 AM 07/21/2022    9:25 AM 02/04/2022    2:33 PM  Diabetic Labs  HbA1c 4.8 - 5.6 % 9.3  8.8  7.6  9.7    Micro/Creat Ratio 0 - 29 mg/g creat  7   34    Chol 100 - 199 mg/dL  885   91    HDL >60 mg/dL  43   36    Calc LDL 0 - 99 mg/dL  40   40    Triglycerides 0 - 149 mg/dL  808   72    Creatinine 0.57 - 1.00 mg/dL 8.61  8.62  8.76  8.49  1.46       01/11/2024   12:49 PM 01/04/2024   11:16 AM 01/04/2024   11:01 AM 01/04/2024   10:32 AM 11/22/2023   10:57 AM 11/22/2023   10:23 AM 09/22/2023    2:18 PM  BP/Weight  Systolic BP -- 150 150 158 148 162 150  Diastolic BP -- 70 64 68 82 70 80  Wt. (Lbs) 235   235.04  231.12   BMI 37.93 kg/m2   37.94 kg/m2  37.3 kg/m2       Latest Ref Rng & Units 03/28/2023   12:00 AM 09/17/2021    1:40 PM  Foot/eye exam completion dates  Eye Exam No Retinopathy No Retinopathy       Foot Form Completion   Done     This result is from an external source.

## 2024-01-17 NOTE — Progress Notes (Signed)
 Kathleen [name]     MRN: [id number]      DOB: Aug 30, 1950  Chief Complaint  Patient presents with   Diabetes    6 week follow up     HPI Kathleen [name] is here for follow up and re-evaluation of chronic medical conditions, medication management and review of any available recent lab and radiology data.  Preventive health is updated, specifically  Cancer screening and Immunization.   Questions or concerns regarding consultations or procedures which the PT has had in the interim are  addressed. The PT denies any adverse reactions to current medications since the last visit.  There are no new concerns.  Very disappointed that blood sugar has worsened, the log she has from home testing is just the opposite ROS Denies recent fever or chills. Denies sinus pressure, nasal congestion, ear pain or sore throat. Denies chest congestion, productive cough or wheezing. Denies chest pains, palpitations and leg swelling Denies abdominal pain, nausea, vomiting,diarrhea or constipation.   Denies dysuria, frequency, hesitancy or incontinence. Denies uncontrolled  joint pain, swelling and limitation in mobility. Denies headaches, seizures, numbness, or tingling. Denies depression, anxiety or insomnia. Denies skin break down or rash.   PE  BP (!) 150/70   Pulse 93   Resp 16   Ht 5' 6 (1.676 m)   Wt 235 lb 0.6 oz (106.6 kg)   SpO2 97%   BMI 37.94 kg/m   Patient alert and oriented and in no cardiopulmonary distress.  HEENT: No facial asymmetry, EOMI,     Neck supple .  Chest: Clear to auscultation bilaterally.  CVS: S1, S2 systolic  murmur, no S3.Regular rate.  ABD: Soft non tender.   Ext: No edema  MS: Adequate ROM spine, shoulders, hips and knees.  Skin: Intact, no ulcerations or rash noted.  Psych: Good eye contact, normal affect. Memory intact not anxious or depressed appearing.  CNS: CN 2-12 intact, power,  normal throughout.no focal deficits noted.   Assessment &  Plan  Diabetes mellitus type 2 with complications (HCC) Diabetes associated with hypertension, hyperlipidemia, and CKD  Kathleen [name] is reminded of the importance of commitment to daily physical activity for 30 minutes or more, as able and the need to limit carbohydrate intake to 30 to 60 grams per meal to help with blood sugar control.  Uncontrolled and worsening, needs to be treated by Endo and agrees, also needs diabetic ed  The need to take medication as prescribed, test blood sugar as directed, and to call between visits if there is a concern that blood sugar is uncontrolled is also discussed.   Kathleen [name] is reminded of the importance of daily foot exam, annual eye examination, and good blood sugar, blood pressure and cholesterol control.     Latest Ref Rng & Units 11/22/2023   11:16 AM 08/16/2023    1:49 PM 02/02/2023   10:22 AM 07/21/2022    9:25 AM 02/04/2022    2:33 PM  Diabetic Labs  HbA1c 4.8 - 5.6 % 9.3  8.8  7.6  9.7    Micro/Creat Ratio 0 - 29 mg/g creat  7   34    Chol 100 - 199 mg/dL  885   91    HDL >60 mg/dL  43   36    Calc LDL 0 - 99 mg/dL  40   40    Triglycerides 0 - 149 mg/dL  808   72    Creatinine 0.57 - 1.00 mg/dL 8.61  1.37  1.23  1.50  1.46       01/11/2024   12:49 PM 01/04/2024   11:16 AM 01/04/2024   11:01 AM 01/04/2024   10:32 AM 11/22/2023   10:57 AM 11/22/2023   10:23 AM 09/22/2023    2:18 PM  BP/Weight  Systolic BP -- 150 150 158 148 162 150  Diastolic BP -- 70 64 68 82 70 80  Wt. (Lbs) 235   235.04  231.12   BMI 37.93 kg/m2   37.94 kg/m2  37.3 kg/m2       Latest Ref Rng & Units 03/28/2023   12:00 AM 09/17/2021    1:40 PM  Foot/eye exam completion dates  Eye Exam No Retinopathy No Retinopathy       Foot Form Completion   Done     This result is from an external source.        Hyperlipidemia LDL goal <100 Hyperlipidemia:Low fat diet discussed and encouraged.   Lipid Panel  Lab Results  Component Value Date   CHOL 114 08/16/2023    HDL 43 08/16/2023   LDLCALC 40 08/16/2023   TRIG 191 (H) 08/16/2023   CHOLHDL 2.5 07/21/2022     Hyperlipidemia:Low fat diet discussed and encouraged.   Lipid Panel  Lab Results  Component Value Date   CHOL 114 08/16/2023   HDL 43 08/16/2023   LDLCALC 40 08/16/2023   TRIG 191 (H) 08/16/2023   CHOLHDL 2.5 07/21/2022     Needs to lower fat inmtake TG are high  Resistant hypertension DASH diet and commitment to daily physical activity for a minimum of 30 minutes discussed and encouraged, as a part of hypertension management. The importance of attaining a healthy weight is also discussed. Remains uncontrolled despite multiple meds has card appt upcoming     01/11/2024   12:49 PM 01/04/2024   11:16 AM 01/04/2024   11:01 AM 01/04/2024   10:32 AM 11/22/2023   10:57 AM 11/22/2023   10:23 AM 09/22/2023    2:18 PM  BP/Weight  Systolic BP -- 150 150 158 148 162 150  Diastolic BP -- 70 64 68 82 70 80  Wt. (Lbs) 235   235.04  231.12   BMI 37.93 kg/m2   37.94 kg/m2  37.3 kg/m2        Severe tricuspid regurgitation Needs cardiology follow up  Morbid obesity (HCC)  Patient re-educated about  the importance of commitment to a  minimum of 150 minutes of exercise per week as able.  The importance of healthy food choices with portion control discussed, as well as eating regularly and within a 12 hour window most days. The need to choose clean , green food 50 to 75% of the time is discussed, as well as to make water the primary drink and set a goal of 64 ounces water daily.       01/11/2024   12:49 PM 01/04/2024   10:32 AM 11/22/2023   10:23 AM  Weight /BMI  Weight 235 lb 235 lb 0.6 oz 231 lb 1.9 oz  Height 5' 6 (1.676 m) 5' 6 (1.676 m) 5' 6 (1.676 m)  BMI 37.93 kg/m2 37.94 kg/m2 37.3 kg/m2

## 2024-01-17 NOTE — Assessment & Plan Note (Addendum)
 Hyperlipidemia:Low fat diet discussed and encouraged.   Lipid Panel  Lab Results  Component Value Date   CHOL 114 08/16/2023   HDL 43 08/16/2023   LDLCALC 40 08/16/2023   TRIG 191 (H) 08/16/2023   CHOLHDL 2.5 07/21/2022     Hyperlipidemia:Low fat diet discussed and encouraged.   Lipid Panel  Lab Results  Component Value Date   CHOL 114 08/16/2023   HDL 43 08/16/2023   LDLCALC 40 08/16/2023   TRIG 191 (H) 08/16/2023   CHOLHDL 2.5 07/21/2022     Needs to lower fat inmtake TG are high

## 2024-01-23 ENCOUNTER — Other Ambulatory Visit: Payer: Self-pay | Admitting: Family Medicine

## 2024-01-23 DIAGNOSIS — G4700 Insomnia, unspecified: Secondary | ICD-10-CM

## 2024-01-26 DIAGNOSIS — I129 Hypertensive chronic kidney disease with stage 1 through stage 4 chronic kidney disease, or unspecified chronic kidney disease: Secondary | ICD-10-CM | POA: Diagnosis not present

## 2024-01-26 DIAGNOSIS — I5032 Chronic diastolic (congestive) heart failure: Secondary | ICD-10-CM | POA: Diagnosis not present

## 2024-01-26 DIAGNOSIS — E1122 Type 2 diabetes mellitus with diabetic chronic kidney disease: Secondary | ICD-10-CM | POA: Diagnosis not present

## 2024-01-28 ENCOUNTER — Other Ambulatory Visit: Payer: Self-pay | Admitting: Family Medicine

## 2024-02-08 DIAGNOSIS — M79675 Pain in left toe(s): Secondary | ICD-10-CM | POA: Diagnosis not present

## 2024-02-08 DIAGNOSIS — E1140 Type 2 diabetes mellitus with diabetic neuropathy, unspecified: Secondary | ICD-10-CM | POA: Diagnosis not present

## 2024-02-08 DIAGNOSIS — L110 Acquired keratosis follicularis: Secondary | ICD-10-CM | POA: Diagnosis not present

## 2024-02-08 DIAGNOSIS — M79672 Pain in left foot: Secondary | ICD-10-CM | POA: Diagnosis not present

## 2024-02-08 DIAGNOSIS — M79674 Pain in right toe(s): Secondary | ICD-10-CM | POA: Diagnosis not present

## 2024-02-08 DIAGNOSIS — M79671 Pain in right foot: Secondary | ICD-10-CM | POA: Diagnosis not present

## 2024-02-23 ENCOUNTER — Other Ambulatory Visit: Payer: Self-pay | Admitting: Family Medicine

## 2024-02-23 DIAGNOSIS — M48061 Spinal stenosis, lumbar region without neurogenic claudication: Secondary | ICD-10-CM

## 2024-02-23 DIAGNOSIS — M545 Low back pain, unspecified: Secondary | ICD-10-CM

## 2024-02-23 DIAGNOSIS — M25552 Pain in left hip: Secondary | ICD-10-CM

## 2024-03-08 ENCOUNTER — Ambulatory Visit: Admitting: Nutrition

## 2024-03-19 ENCOUNTER — Other Ambulatory Visit: Payer: Self-pay | Admitting: Family Medicine

## 2024-03-23 ENCOUNTER — Other Ambulatory Visit: Payer: Self-pay | Admitting: Family Medicine

## 2024-03-23 DIAGNOSIS — M48061 Spinal stenosis, lumbar region without neurogenic claudication: Secondary | ICD-10-CM

## 2024-03-23 DIAGNOSIS — M545 Low back pain, unspecified: Secondary | ICD-10-CM

## 2024-03-23 DIAGNOSIS — M25552 Pain in left hip: Secondary | ICD-10-CM

## 2024-03-23 NOTE — Telephone Encounter (Signed)
 Copied from CRM #[id number]. Topic: Clinical - Medication Refill >> Mar 23, 2024 10:24 AM Treva DASEN wrote: [place], calling with [facility], calling requesting medication refill for pt  Medication: gabapentin  (NEURONTIN ) 100 MG capsule, 90 day supply  Has the patient contacted their pharmacy? Yes (Agent: If no, request that the patient contact the pharmacy for the refill. If patient does not wish to contact the pharmacy document the reason why and proceed with request.) (Agent: If yes, when and what did the pharmacy advise?)  This is the patient's preferred pharmacy:   [facility] [place] [address] [place] [address] Phone: [phone] Fax: [phone]  Is this the correct pharmacy for this prescription? Yes If no, delete pharmacy and type the correct one.   Has the prescription been filled recently? Yes  Is the patient out of the medication? Yes  Has the patient been seen for an appointment in the last year OR does the patient have an upcoming appointment? Yes  Can we respond through MyChart? No  Agent: Please be advised that Rx refills may take up to 3 business days. We ask that you follow-up with your pharmacy.

## 2024-03-26 ENCOUNTER — Other Ambulatory Visit: Payer: Self-pay | Admitting: Family Medicine

## 2024-03-27 LAB — OPHTHALMOLOGY REPORT-SCANNED

## 2024-03-28 ENCOUNTER — Encounter: Admitting: Nutrition

## 2024-03-29 ENCOUNTER — Other Ambulatory Visit: Payer: Self-pay | Admitting: Family Medicine

## 2024-04-13 ENCOUNTER — Other Ambulatory Visit: Payer: Self-pay

## 2024-04-13 ENCOUNTER — Ambulatory Visit: Payer: Self-pay | Admitting: Family Medicine

## 2024-04-13 MED ORDER — HYDRALAZINE HCL 25 MG PO TABS: 25.0000 mg | ORAL_TABLET | Freq: Three times a day (TID) | ORAL | 11 refills | Status: DC

## 2024-04-13 MED ORDER — APIXABAN 5 MG PO TABS: 5.0000 mg | ORAL_TABLET | Freq: Two times a day (BID) | ORAL | 11 refills | Status: AC

## 2024-04-13 NOTE — Telephone Encounter (Signed)
 Resent since pharmacy states they never received on 11/20

## 2024-04-13 NOTE — Telephone Encounter (Signed)
---  Pharmacy called earlier at 12:29pm---Olivia from [facility] is calling to request refills for the following medications: Eliquis  5 mg tablets and Hydralazine  (Apresoline ) 25 mg tablets. Olivia can be contacted at [phone].  This RN called patient to see how much medication she had left of her Eliquis  and her Hydralazine  Patient disconnected when asked for name and date of birth  Medications appear to have been sent already on 03/29/24 from what this RN sees in the patient's chart. This RN called [facility] and was advised to send a CRM to the clinical staff.      FYI Only or Action Required?: Action required by provider: medication refill request and [facility] had called earlier requesting two refills for this patient.  Patient was last seen in primary care on 01/04/2024 by Antonetta Rollene BRAVO, MD.  Called Nurse Triage reporting Medication Refill.   Triage Disposition: No disposition on file.  Patient/caregiver understands and will follow disposition?:                Message from Antwanette L sent at 04/13/2024 12:29 PM EST  Reason for Triage: Olivia from [facility] is calling to request refills for the following medications: Eliquis  5 mg tablets and Hydralazine  (Apresoline ) 25 mg tablets. Olivia can be contacted at [phone].   Reason for Disposition  [1] Pharmacy calling with prescription questions AND [2] triager unable to answer question  Answer Assessment - Initial Assessment Questions ---Pharmacy called earlier at 12:29pm---Olivia from [facility] is calling to request refills for the following medications: Eliquis  5 mg tablets and Hydralazine  (Apresoline ) 25 mg tablets. Olivia can be contacted at [phone].  This RN called patient to see how much medication she had left of her Eliquis  and her Hydralazine  Patient disconnected when asked for name and date of birth  Medications appear to have been sent already on 03/29/24 from what this RN  sees in the patient's chart. This RN called [facility] and was advised to send a CRM to the clinical staff.  Protocols used: Medication Refill and Renewal Call-A-AH

## 2024-04-16 ENCOUNTER — Ambulatory Visit: Admitting: Internal Medicine

## 2024-04-18 ENCOUNTER — Ambulatory Visit: Attending: Internal Medicine | Admitting: Internal Medicine

## 2024-04-18 NOTE — Progress Notes (Signed)
 Erroneous encounter - please disregard.

## 2024-04-20 ENCOUNTER — Ambulatory Visit: Admitting: Internal Medicine

## 2024-04-20 ENCOUNTER — Other Ambulatory Visit: Payer: Self-pay | Admitting: Internal Medicine

## 2024-04-20 ENCOUNTER — Other Ambulatory Visit: Payer: Self-pay | Admitting: Family Medicine

## 2024-04-20 DIAGNOSIS — G4700 Insomnia, unspecified: Secondary | ICD-10-CM

## 2024-04-23 ENCOUNTER — Other Ambulatory Visit (HOSPITAL_COMMUNITY)

## 2024-04-23 ENCOUNTER — Ambulatory Visit (HOSPITAL_COMMUNITY)

## 2024-04-25 ENCOUNTER — Ambulatory Visit: Payer: Self-pay | Admitting: Family Medicine

## 2024-04-25 ENCOUNTER — Inpatient Hospital Stay (HOSPITAL_COMMUNITY): Admission: RE | Admit: 2024-04-25 | Discharge: 2024-04-25 | Attending: Family Medicine | Admitting: Family Medicine

## 2024-04-25 ENCOUNTER — Encounter (HOSPITAL_COMMUNITY): Payer: Self-pay

## 2024-04-25 DIAGNOSIS — Z1231 Encounter for screening mammogram for malignant neoplasm of breast: Secondary | ICD-10-CM | POA: Insufficient documentation

## 2024-04-25 DIAGNOSIS — Z780 Asymptomatic menopausal state: Secondary | ICD-10-CM | POA: Diagnosis present

## 2024-04-27 ENCOUNTER — Encounter: Payer: Self-pay | Admitting: Family Medicine

## 2024-05-04 ENCOUNTER — Other Ambulatory Visit: Payer: Self-pay | Admitting: Family Medicine

## 2024-05-31 ENCOUNTER — Encounter: Payer: Self-pay | Admitting: Family Medicine

## 2024-05-31 ENCOUNTER — Ambulatory Visit (INDEPENDENT_AMBULATORY_CARE_PROVIDER_SITE_OTHER): Admitting: Family Medicine

## 2024-05-31 VITALS — BP 150/82 | HR 92 | Resp 16 | Ht 66.0 in | Wt 237.1 lb

## 2024-05-31 DIAGNOSIS — Z23 Encounter for immunization: Secondary | ICD-10-CM

## 2024-05-31 DIAGNOSIS — E559 Vitamin D deficiency, unspecified: Secondary | ICD-10-CM | POA: Diagnosis not present

## 2024-05-31 DIAGNOSIS — E1122 Type 2 diabetes mellitus with diabetic chronic kidney disease: Secondary | ICD-10-CM

## 2024-05-31 DIAGNOSIS — Z6838 Body mass index (BMI) 38.0-38.9, adult: Secondary | ICD-10-CM

## 2024-05-31 DIAGNOSIS — N1831 Chronic kidney disease, stage 3a: Secondary | ICD-10-CM | POA: Diagnosis not present

## 2024-05-31 DIAGNOSIS — E66812 Obesity, class 2: Secondary | ICD-10-CM

## 2024-05-31 DIAGNOSIS — E785 Hyperlipidemia, unspecified: Secondary | ICD-10-CM | POA: Diagnosis not present

## 2024-05-31 DIAGNOSIS — I1A0 Resistant hypertension: Secondary | ICD-10-CM

## 2024-05-31 DIAGNOSIS — E118 Type 2 diabetes mellitus with unspecified complications: Secondary | ICD-10-CM

## 2024-05-31 DIAGNOSIS — E1169 Type 2 diabetes mellitus with other specified complication: Secondary | ICD-10-CM

## 2024-05-31 MED ORDER — HYDRALAZINE HCL 50 MG PO TABS: 50.0000 mg | ORAL_TABLET | Freq: Three times a day (TID) | ORAL | 3 refills | Status: AC

## 2024-05-31 NOTE — Patient Instructions (Addendum)
 Annual exam early May  Nurse please send for eye exam Dr Darroll around Fall 2025   CBC, lipid, cmp and EGFR, HBA1C, vit D , TSH today  Flu vaccine today  New higher dose of hydralazine  since your blood pressure is still high is 50 mg one three times daily, 7, 3, 10  It is important that you exercise regularly at least 30 minutes 5 times a week. If you develop chest pain, have severe difficulty breathing, or feel very tired, stop exercising immediately and seek medical attention   Think about what you will eat, plan ahead. Choose  clean, green, fresh or frozen over canned, processed or packaged foods which are more sugary, salty and fatty. 70 to 75% of food eaten should be vegetables and fruit. Three meals at set times with snacks allowed between meals, but they must be fruit or vegetables. Aim to eat over a 12 hour period , example 7 am to 7 pm, and STOP after  your last meal of the day. Drink water,generally about 64 ounces per day, no other drink is as healthy. Fruit juice is best enjoyed in a healthy way, by EATING the fruit.   Thanks for choosing [facility], we consider it a privelige to serve you.

## 2024-06-01 LAB — CBC WITH DIFFERENTIAL/PLATELET
Basophils Absolute: 0 x10E3/uL (ref 0.0–0.2)
Basos: 1 %
EOS (ABSOLUTE): 0.2 x10E3/uL (ref 0.0–0.4)
Eos: 2 %
Hematocrit: 37.7 % (ref 34.0–46.6)
Hemoglobin: 11.3 g/dL (ref 11.1–15.9)
Immature Grans (Abs): 0 x10E3/uL (ref 0.0–0.1)
Immature Granulocytes: 0 %
Lymphocytes Absolute: 2 x10E3/uL (ref 0.7–3.1)
Lymphs: 30 %
MCH: 26 pg — ABNORMAL LOW (ref 26.6–33.0)
MCHC: 30 g/dL — ABNORMAL LOW (ref 31.5–35.7)
MCV: 87 fL (ref 79–97)
Monocytes Absolute: 0.4 x10E3/uL (ref 0.1–0.9)
Monocytes: 7 %
Neutrophils Absolute: 4 x10E3/uL (ref 1.4–7.0)
Neutrophils: 60 %
Platelets: 272 x10E3/uL (ref 150–450)
RBC: 4.35 x10E6/uL (ref 3.77–5.28)
RDW: 13.6 % (ref 11.7–15.4)
WBC: 6.6 x10E3/uL (ref 3.4–10.8)

## 2024-06-01 LAB — LIPID PANEL
Chol/HDL Ratio: 2.7 ratio (ref 0.0–4.4)
Cholesterol, Total: 101 mg/dL (ref 100–199)
HDL: 38 mg/dL — ABNORMAL LOW
LDL Chol Calc (NIH): 40 mg/dL (ref 0–99)
Triglycerides: 130 mg/dL (ref 0–149)
VLDL Cholesterol Cal: 23 mg/dL (ref 5–40)

## 2024-06-01 LAB — CMP14+EGFR
ALT: 13 IU/L (ref 0–32)
AST: 16 IU/L (ref 0–40)
Albumin: 4 g/dL (ref 3.8–4.8)
Alkaline Phosphatase: 112 IU/L (ref 49–135)
BUN/Creatinine Ratio: 14 (ref 12–28)
BUN: 20 mg/dL (ref 8–27)
Bilirubin Total: 0.2 mg/dL (ref 0.0–1.2)
CO2: 19 mmol/L — ABNORMAL LOW (ref 20–29)
Calcium: 9.3 mg/dL (ref 8.7–10.3)
Chloride: 101 mmol/L (ref 96–106)
Creatinine, Ser: 1.44 mg/dL — ABNORMAL HIGH (ref 0.57–1.00)
Globulin, Total: 3.3 g/dL (ref 1.5–4.5)
Glucose: 207 mg/dL — ABNORMAL HIGH (ref 70–99)
Potassium: 5 mmol/L (ref 3.5–5.2)
Sodium: 138 mmol/L (ref 134–144)
Total Protein: 7.3 g/dL (ref 6.0–8.5)
eGFR: 38 mL/min/1.73 — ABNORMAL LOW

## 2024-06-01 LAB — TSH: TSH: 1.55 u[IU]/mL (ref 0.450–4.500)

## 2024-06-01 LAB — HEMOGLOBIN A1C
Est. average glucose Bld gHb Est-mCnc: 163 mg/dL
Hgb A1c MFr Bld: 7.3 % — ABNORMAL HIGH (ref 4.8–5.6)

## 2024-06-01 LAB — VITAMIN D 25 HYDROXY (VIT D DEFICIENCY, FRACTURES): Vit D, 25-Hydroxy: 14.4 ng/mL — ABNORMAL LOW (ref 30.0–100.0)

## 2024-06-03 ENCOUNTER — Ambulatory Visit: Payer: Self-pay | Admitting: Family Medicine

## 2024-06-03 ENCOUNTER — Encounter: Payer: Self-pay | Admitting: Family Medicine

## 2024-06-03 DIAGNOSIS — Z23 Encounter for immunization: Secondary | ICD-10-CM | POA: Insufficient documentation

## 2024-06-03 MED ORDER — VITAMIN D (ERGOCALCIFEROL) 1.25 MG (50000 UNIT) PO CAPS: 50000.0000 [IU] | ORAL_CAPSULE | ORAL | 2 refills | Status: AC

## 2024-06-03 NOTE — Assessment & Plan Note (Signed)
 Diabetes associated with hypertension, hyperlipidemia, obesity, CKD, and arthritis  Kathleen [name] is reminded of the importance of commitment to daily physical activity for 30 minutes or more, as able and the need to limit carbohydrate intake to 30 to 60 grams per meal to help with blood sugar control.   The need to take medication as prescribed, test blood sugar as directed, and to call between visits if there is a concern that blood sugar is uncontrolled is also discussed.   Kathleen [name] is reminded of the importance of daily foot exam, annual eye examination, and good blood sugar, blood pressure and cholesterol control.     Latest Ref Rng & Units 05/31/2024   11:14 AM 11/22/2023   11:16 AM 08/16/2023    1:49 PM 02/02/2023   10:22 AM 07/21/2022    9:25 AM  Diabetic Labs  HbA1c 4.8 - 5.6 % 7.3  9.3  8.8  7.6  9.7   Micro/Creat Ratio 0 - 29 mg/g creat   7   34   Chol 100 - 199 mg/dL 898   885   91   HDL >60 mg/dL 38   43   36   Calc LDL 0 - 99 mg/dL 40   40   40   Triglycerides 0 - 149 mg/dL 869   808   72   Creatinine 0.57 - 1.00 mg/dL 8.55  8.61  8.62  8.76  1.50       05/31/2024   10:53 AM 05/31/2024   10:35 AM 01/11/2024   12:49 PM 01/04/2024   11:16 AM 01/04/2024   11:01 AM 01/04/2024   10:32 AM 11/22/2023   10:57 AM  BP/Weight  Systolic BP 150 153 -- 150 150 158 148  Diastolic BP 82 82 -- 70 64 68 82  Wt. (Lbs)  237.08 235   235.04   BMI  38.27 kg/m2 37.93 kg/m2   37.94 kg/m2       Latest Ref Rng & Units 03/28/2023   12:00 AM 09/17/2021    1:40 PM  Foot/eye exam completion dates  Eye Exam No Retinopathy No Retinopathy       Foot Form Completion   Done     This result is from an external source.      Marked improvement , no med chane, pt congratulated on this

## 2024-06-03 NOTE — Assessment & Plan Note (Signed)
 After obtaining informed consent, the influenza vaccine is  administered , with no adverse effect noted at the time of administration.

## 2024-06-03 NOTE — Progress Notes (Signed)
 "  Kathleen [name]     MRN: [id number]      DOB: 18-Jun-1950  Chief Complaint  Patient presents with   Medical Management of Chronic Issues    5 month follow up     HPI Kathleen [name] is here for follow up and re-evaluation of chronic medical conditions, medication management and review of any available recent lab and radiology data.  Preventive health is updated, specifically  Cancer screening and Immunization.   Questions or concerns regarding consultations or procedures which the PT has had in the interim are  addressed. The PT denies any adverse reactions to current medications since the last visit.  There are no new concerns.  There are no specific complaints   ROS Denies recent fever or chills. Denies sinus pressure, nasal congestion, ear pain or sore throat. Denies chest congestion, productive cough or wheezing. Denies chest pains, palpitations and leg swelling Denies abdominal pain, nausea, vomiting,diarrhea or constipation.   Denies dysuria, frequency, hesitancy or incontinence. Denies headaches, seizures, numbness, or tingling. Denies depression, anxiety or insomnia. Denies skin break down or rash.   PE  BP (!) 150/82   Pulse 92   Resp 16   Ht 5' 6 (1.676 m)   Wt 237 lb 1.3 oz (107.5 kg)   SpO2 91%   BMI 38.27 kg/m   Patient alert and oriented and in no cardiopulmonary distress.  HEENT: No facial asymmetry, EOMI,     Neck supple .  Chest: Clear to auscultation bilaterally.  CVS: S1, S2 systolic  murmur, no S3.Regular rate.  ABD: Soft non tender.   Ext: No edema  MS:  decreased though Adequate ROM spine, shoulders, hips and knees.  Skin: Intact, no ulcerations or rash noted.  Psych: Good eye contact, normal affect. Memory intact not anxious or depressed appearing.  CNS: CN 2-12 intact, power,  normal throughout.no focal deficits noted.   Assessment & Plan  Immunization due After obtaining informed consent, the influenza vaccine is  administered ,  with no adverse effect noted at the time of administration.   Diabetes mellitus type 2 with complications (HCC) Diabetes associated with hypertension, hyperlipidemia, obesity, CKD, and arthritis  Kathleen [name] is reminded of the importance of commitment to daily physical activity for 30 minutes or more, as able and the need to limit carbohydrate intake to 30 to 60 grams per meal to help with blood sugar control.   The need to take medication as prescribed, test blood sugar as directed, and to call between visits if there is a concern that blood sugar is uncontrolled is also discussed.   Kathleen [name] is reminded of the importance of daily foot exam, annual eye examination, and good blood sugar, blood pressure and cholesterol control.     Latest Ref Rng & Units 05/31/2024   11:14 AM 11/22/2023   11:16 AM 08/16/2023    1:49 PM 02/02/2023   10:22 AM 07/21/2022    9:25 AM  Diabetic Labs  HbA1c 4.8 - 5.6 % 7.3  9.3  8.8  7.6  9.7   Micro/Creat Ratio 0 - 29 mg/g creat   7   34   Chol 100 - 199 mg/dL 898   885   91   HDL >60 mg/dL 38   43   36   Calc LDL 0 - 99 mg/dL 40   40   40   Triglycerides 0 - 149 mg/dL 869   808   72   Creatinine 0.57 -  1.00 mg/dL 8.55  8.61  8.62  8.76  1.50       05/31/2024   10:53 AM 05/31/2024   10:35 AM 01/11/2024   12:49 PM 01/04/2024   11:16 AM 01/04/2024   11:01 AM 01/04/2024   10:32 AM 11/22/2023   10:57 AM  BP/Weight  Systolic BP 150 153 -- 150 150 158 148  Diastolic BP 82 82 -- 70 64 68 82  Wt. (Lbs)  237.08 235   235.04   BMI  38.27 kg/m2 37.93 kg/m2   37.94 kg/m2       Latest Ref Rng & Units 03/28/2023   12:00 AM 09/17/2021    1:40 PM  Foot/eye exam completion dates  Eye Exam No Retinopathy No Retinopathy       Foot Form Completion   Done     This result is from an external source.      Marked improvement , no med chane, pt congratulated on this  Hyperlipidemia LDL goal <100 Hyperlipidemia:Low fat diet discussed and encouraged.   Lipid Panel   Lab Results  Component Value Date   CHOL 101 05/31/2024   HDL 38 (L) 05/31/2024   LDLCALC 40 05/31/2024   TRIG 130 05/31/2024   CHOLHDL 2.7 05/31/2024     Controlled, no change in medication Needs too inc exercise  Morbid obesity (HCC)  Patient re-educated about  the importance of commitment to a  minimum of 150 minutes of exercise per week as able.  The importance of healthy food choices with portion control discussed, as well as eating regularly and within a 12 hour window most days. The need to choose clean , green food 50 to 75% of the time is discussed, as well as to make water the primary drink and set a goal of 64 ounces water daily.       05/31/2024   10:35 AM 01/11/2024   12:49 PM 01/04/2024   10:32 AM  Weight /BMI  Weight 237 lb 1.3 oz 235 lb 235 lb 0.6 oz  Height 5' 6 (1.676 m) 5' 6 (1.676 m) 5' 6 (1.676 m)  BMI 38.27 kg/m2 37.93 kg/m2 37.94 kg/m2    unchanged  Chronic kidney disease, stage 3 unspecified (HCC) Stable currently x 9 months, c improvement in blood sugar applauded, working on blood pressure control Has ben referred to nephrology  Vitamin D  deficiency Updated lab needed at/ before next visit.  "

## 2024-06-03 NOTE — Assessment & Plan Note (Signed)
 Stable currently x 9 months, c improvement in blood sugar applauded, working on blood pressure control Has ben referred to nephrology

## 2024-06-03 NOTE — Assessment & Plan Note (Signed)
" °  Patient re-educated about  the importance of commitment to a  minimum of 150 minutes of exercise per week as able.  The importance of healthy food choices with portion control discussed, as well as eating regularly and within a 12 hour window most days. The need to choose clean , green food 50 to 75% of the time is discussed, as well as to make water the primary drink and set a goal of 64 ounces water daily.       05/31/2024   10:35 AM 01/11/2024   12:49 PM 01/04/2024   10:32 AM  Weight /BMI  Weight 237 lb 1.3 oz 235 lb 235 lb 0.6 oz  Height 5' 6 (1.676 m) 5' 6 (1.676 m) 5' 6 (1.676 m)  BMI 38.27 kg/m2 37.93 kg/m2 37.94 kg/m2    unchanged "

## 2024-06-03 NOTE — Assessment & Plan Note (Signed)
 Hyperlipidemia:Low fat diet discussed and encouraged.   Lipid Panel  Lab Results  Component Value Date   CHOL 101 05/31/2024   HDL 38 (L) 05/31/2024   LDLCALC 40 05/31/2024   TRIG 130 05/31/2024   CHOLHDL 2.7 05/31/2024     Controlled, no change in medication Needs too inc exercise

## 2024-06-03 NOTE — Assessment & Plan Note (Signed)
 Updated lab needed at/ before next visit.

## 2024-06-12 ENCOUNTER — Other Ambulatory Visit: Payer: Self-pay | Admitting: Family Medicine

## 2024-06-13 ENCOUNTER — Other Ambulatory Visit: Payer: Self-pay | Admitting: Family Medicine

## 2024-06-13 DIAGNOSIS — J302 Other seasonal allergic rhinitis: Secondary | ICD-10-CM

## 2024-07-02 ENCOUNTER — Ambulatory Visit: Admitting: Nurse Practitioner

## 2024-09-27 ENCOUNTER — Encounter: Payer: Self-pay | Admitting: Family Medicine

## 2025-01-16 ENCOUNTER — Ambulatory Visit
